# Patient Record
Sex: Female | Born: 1945
Health system: Southern US, Community
[De-identification: ages and names within clinical notes are randomized; demographics above are authoritative.]

## PROBLEM LIST (undated history)

## (undated) DIAGNOSIS — I509 Heart failure, unspecified: Secondary | ICD-10-CM

## (undated) DIAGNOSIS — I10 Essential (primary) hypertension: Secondary | ICD-10-CM

## (undated) DIAGNOSIS — I639 Cerebral infarction, unspecified: Secondary | ICD-10-CM

## (undated) DIAGNOSIS — T82867A Thrombosis of cardiac prosthetic devices, implants and grafts, initial encounter: Secondary | ICD-10-CM

## (undated) DIAGNOSIS — Z952 Presence of prosthetic heart valve: Secondary | ICD-10-CM

## (undated) DIAGNOSIS — I38 Endocarditis, valve unspecified: Secondary | ICD-10-CM

## (undated) DIAGNOSIS — E785 Hyperlipidemia, unspecified: Secondary | ICD-10-CM

## (undated) DIAGNOSIS — Z953 Presence of xenogenic heart valve: Secondary | ICD-10-CM

## (undated) DIAGNOSIS — T8209XA Other mechanical complication of heart valve prosthesis, initial encounter: Secondary | ICD-10-CM

## (undated) HISTORY — DX: Essential (primary) hypertension: I10

## (undated) HISTORY — PX: CARDIAC VALVE REPLACEMENT: SHX585

## (undated) HISTORY — DX: Hyperlipidemia, unspecified: E78.5

## (undated) HISTORY — DX: Cerebral infarction, unspecified: I63.9

## (undated) HISTORY — PX: TUBAL LIGATION: SHX77

## (undated) HISTORY — DX: Presence of prosthetic heart valve: Z95.2

## (undated) HISTORY — PX: ABDOMINAL HYSTERECTOMY: SHX81

---

## 1998-01-07 DIAGNOSIS — I38 Endocarditis, valve unspecified: Secondary | ICD-10-CM

## 1998-01-07 HISTORY — DX: Endocarditis, valve unspecified: I38

## 1998-03-23 ENCOUNTER — Inpatient Hospital Stay (HOSPITAL_COMMUNITY): Admission: EM | Admit: 1998-03-23 | Discharge: 1998-04-18 | Payer: Self-pay | Admitting: *Deleted

## 1998-03-24 ENCOUNTER — Encounter: Payer: Self-pay | Admitting: Internal Medicine

## 1998-03-25 ENCOUNTER — Encounter: Payer: Self-pay | Admitting: Internal Medicine

## 1998-03-26 ENCOUNTER — Encounter: Payer: Self-pay | Admitting: Internal Medicine

## 1998-03-27 ENCOUNTER — Encounter: Payer: Self-pay | Admitting: Internal Medicine

## 1998-03-30 ENCOUNTER — Encounter: Payer: Self-pay | Admitting: Internal Medicine

## 1998-04-05 ENCOUNTER — Encounter: Payer: Self-pay | Admitting: Thoracic Surgery (Cardiothoracic Vascular Surgery)

## 1998-04-05 ENCOUNTER — Encounter: Payer: Self-pay | Admitting: Infectious Diseases

## 1998-04-06 ENCOUNTER — Encounter: Payer: Self-pay | Admitting: Thoracic Surgery (Cardiothoracic Vascular Surgery)

## 1998-04-06 ENCOUNTER — Encounter: Payer: Self-pay | Admitting: Internal Medicine

## 1998-04-11 ENCOUNTER — Encounter: Payer: Self-pay | Admitting: Thoracic Surgery (Cardiothoracic Vascular Surgery)

## 1998-04-11 DIAGNOSIS — Z952 Presence of prosthetic heart valve: Secondary | ICD-10-CM

## 1998-04-11 HISTORY — DX: Presence of prosthetic heart valve: Z95.2

## 1998-04-11 HISTORY — PX: MITRAL VALVE REPLACEMENT: SHX147

## 1998-04-12 ENCOUNTER — Encounter: Payer: Self-pay | Admitting: Thoracic Surgery (Cardiothoracic Vascular Surgery)

## 1998-04-13 ENCOUNTER — Encounter: Payer: Self-pay | Admitting: Thoracic Surgery (Cardiothoracic Vascular Surgery)

## 1998-04-14 ENCOUNTER — Encounter: Payer: Self-pay | Admitting: Thoracic Surgery (Cardiothoracic Vascular Surgery)

## 1998-04-16 ENCOUNTER — Encounter: Payer: Self-pay | Admitting: Thoracic Surgery (Cardiothoracic Vascular Surgery)

## 1998-04-17 ENCOUNTER — Encounter: Payer: Self-pay | Admitting: Thoracic Surgery (Cardiothoracic Vascular Surgery)

## 1998-12-14 ENCOUNTER — Encounter (INDEPENDENT_AMBULATORY_CARE_PROVIDER_SITE_OTHER): Payer: Self-pay

## 1998-12-14 ENCOUNTER — Inpatient Hospital Stay (HOSPITAL_COMMUNITY): Admission: RE | Admit: 1998-12-14 | Discharge: 1998-12-18 | Payer: Self-pay | Admitting: Obstetrics and Gynecology

## 1998-12-14 HISTORY — PX: TOTAL ABDOMINAL HYSTERECTOMY W/ BILATERAL SALPINGOOPHORECTOMY: SHX83

## 2000-04-09 ENCOUNTER — Other Ambulatory Visit: Admission: RE | Admit: 2000-04-09 | Discharge: 2000-04-09 | Payer: Self-pay | Admitting: Obstetrics and Gynecology

## 2006-10-31 ENCOUNTER — Emergency Department (HOSPITAL_COMMUNITY): Admission: EM | Admit: 2006-10-31 | Discharge: 2006-10-31 | Payer: Self-pay | Admitting: Family Medicine

## 2007-01-12 ENCOUNTER — Inpatient Hospital Stay (HOSPITAL_COMMUNITY): Admission: EM | Admit: 2007-01-12 | Discharge: 2007-01-17 | Payer: Self-pay | Admitting: Emergency Medicine

## 2008-11-07 ENCOUNTER — Emergency Department (HOSPITAL_COMMUNITY): Admission: EM | Admit: 2008-11-07 | Discharge: 2008-11-07 | Payer: Self-pay | Admitting: Emergency Medicine

## 2009-11-07 HISTORY — PX: TRANSTHORACIC ECHOCARDIOGRAM: SHX275

## 2010-05-25 NOTE — Discharge Summary (Signed)
NAMEBenay Rodriguez             ACCOUNT NO.:  [id number]   MEDICAL RECORD NO.:  [id number]          PATIENT TYPE:  INP   LOCATION:  6526                         FACILITY:  [facility]   PHYSICIAN:  Madaline Savage, M.D.DATE OF BIRTH:  03-07-1945   DATE OF ADMISSION:  01/12/2007  DATE OF DISCHARGE:  01/17/2007                               DISCHARGE SUMMARY   DISCHARGE DIAGNOSES:  1. Agitation  2. Uncontrolled hypertension.  3. History of St. Jude MVR was subtherapeutic INR at admission.  4. History of PAF.  5. History of noncompliance   HOSPITAL COURSE:  Ms.  [name] is a 65 year old female who actually used  to work at [employer].  She has a history of a St. Jude mitral valve  2000 by Dr. Cornelius Moras.  She was admitted by Dr. Elsie Lincoln January 12, 2007, with  dizziness and being anxious and agitated.  She said she had had problems  with affording her medications and had been off all medicines for a  month.  INR was 1.35.  She was admitted, put on Lovenox and Coumadin was  resumed.  Medications for hypertension were resumed as well.  Troponin  was slightly positive.  We were concerned about her prosthesis,  especially being off Coumadin.  At TEE was obtained which showed at  least two but probably multiple was highly mobile thrombi attached to  the mitral valve ring which were nonobstructive.  The gradient across  the valve was normal.  LV function was depressed at 25%.  We did do an  MRI prior to discharge as we were concerned there may be some component  of possible multi-infarct dementia.  MRI showed a small area diffusion  restriction in the right inferior occipital lobe which appeared to  represent a small acute infarct.  She had mild chronic microvascular  changes and chronic sinusitis.  Dr. Lynnea Ferrier saw the patient January 17, 2007.  He felt the patient could be discharged.  She will follow up with  Dr. Elsie Lincoln as an outpatient.   DISCHARGE LABORATORIES:  White count 2.9, hemoglobin  13.9, hematocrit  40.8, INR is 3.3.  Sodium 141, potassium 3.4, BUN 6, creatinine 1.0.  Liver functions were slightly elevated with an AST of 42 and ALT of 42,  total bili was 2.2 hemoglobin A1c was 5.8.  Troponin was slightly  elevated at 1.35.  CK MBs negative.  TSH is 1.58.  EKG shows sinus  rhythm.   DISPOSITION:  The patient is discharged in stable condition and will  follow-up with Dr. Elsie Lincoln as an outpatient.   DISCHARGE MEDICATIONS:  1. Norvasc 10 mg a day.  2. Lisinopril 10 mg a day.  3. Coreg 6.25 1/2 tablet b.i.d.  4. Coumadin cut back to 3 mg a day or as directed at discharge.  5. Aldactone 12.5 mg a day.  6. Ambien p.r.n. for sleep.  7. She will have a protime Monday after discharge.   DISPOSITION:  The patient is discharged in stable condition.      Abelino Derrick, P.A.    ______________________________  Madaline Savage, M.D.  LKK/MEDQ  D:  02/02/2007  T:  02/03/2007  Job:  [id number]

## 2010-05-25 NOTE — Op Note (Signed)
[facility]  Patient:    [name]                     MRN: [id number] Proc. Date: 12/14/98 Adm. Date:  [id number] Attending:  Trevor Iha                           Operative Report  PREOPERATIVE DIAGNOSIS:       22-week size leiomyomatous uterus.  POSTOPERATIVE DIAGNOSIS:      22-week size leiomyomatous uterus.  OPERATION:                    TAH, BSO.  SURGEON:                      Trevor Iha, M.D.  ASSISTANT:                    Guy Sandifer. Arleta Creek, M.D.  ANESTHESIA:                   General endotracheal anesthesia.  ESTIMATED BLOOD LOSS:  INDICATIONS:                  [name] is a 65 year old, gravida 3, para 3, with large leiomyomatous uterus and currently on anticoagulation due to heart valve replacement.  She presents today for total abdominal hysterectomy and bilateral  salpingo-oophorectomy.  Coumadin was stopped four days ago.  The Lovenox was stopped yesterday.  INR this morning was 1.2.  The risks and benefits of the procedure were discussed at length including, but not limited to, risks of infection, damage to bowel, bladder, ureters with need for further surgery, and  also bleeding.  She is at a significant risk for bleeding both intraoperatively and postoperatively.  She was offered autologous blood donation, but missed her appointment and was unable to give.  She understands the risks associated with blood transfusion and plans to proceed with TAHBSO today.  Again the informed consent was obtained.  DESCRIPTION OF PROCEDURE:     After adequate general anesthesia, the patient was placed in the supine position.  She was sterilely prepped and draped.  A midline incision was made from the pubic symphysis up to just under the umbilicus, taken down sharply to the fascia which was incised rectolaterally.  The peritoneum was entered sharply and extended superiorly and inferiorly.  A large  irregular shaped leiomyomatous uterus and normal appearing tubes and ovaries were noted.  The OConnor-OSullivan retractor was placed and the bowel was packed cephalad.  The left round ligament was identified, suture ligated with 0 Monocryl.  The right round ligament was identified in a similar fashion and suture ligated with 0 Monocryl.  The bladder was reflected off the anterior surface of the uterus and  cervix.  The left infundibulopelvic ligament was identified, clamped, and doubly ligated with 0 Monocryl suture.  A lot of edema and vascular congestion was noted in the infundibulopelvic ligament.  Care was taken to avoid the ureter.  This was done similarly on the right side, doubly ligating the right infundibulopelvic ligament.  Kelly clamps had been placed across the broad ligament bilaterally at this point.  The large vessels and vasculature were clamped, cut, and tied along the sides of the uterus down to the level of the internal os.  They were clamped, cut, and tied using 0 Monocryl sutures until the  uterine vasculature at the level of the internal os was clamped, cut, and tied.  This was done bilaterally.  At his point, the uterine corpus was extracted using a knife excising the uterus above the internal os and removing the tissue and sending it to pathology.  Kocher was placed on the cervix.  Heaney clamps were clamped, cut, and tied bilaterally with 0 Monocryl suture along the cardinal ligaments down to the uterosacral ligaments. At this point, the Heaney clamps were placed across the vagina bilaterally.  The remainder of the cervix was removed intact and sent with this specimen.  0 Monocryl suture was used to close the vagina at this point in a horizontal fashion.  0 Monocryl sutures in figure-of-eight were used along the vagina for good hemostasis and closure.  The uterosacral ligaments were then plicated in the midline with  Monocryl suture.  Irrigation was  applied and after adequate hemostasis was assured, reexamination of all of the pedicles and peritoneal edges revealed good hemostasis. Because of the propensity of the bleeding along the peritoneal edges, Gelfoam was placed in the cul-de-sac.  The appendix was noted to be normal.  The packing was removed.  The OConnor-OSullivan retractor was then removed.  The incision was then closed in a mass closure with double-stranded 0 PDS starting at the umbilicus after two throws, contamination of the needle occurred, so the suture was cut.  Contaminated needle was removed and second suture of 0 PDS double-stranded was begun inferiorly in a modified Smead-Jones fashion.  After closure, the two sutures were tied in the midline with good approximation and good hemostasis. Irrigation was once again applied and after adequate hemostasis, the skin was stapled. The patient tolerated the procedure well and was stable on transfer to the recovery  room.  The estimated blood loss was 400 cc. DD:  12/14/98 TD:  12/14/98 Job: [id number]

## 2010-05-25 NOTE — Discharge Summary (Signed)
[facility]  Patient:    [name]                     MRN: [id number] Adm. Date:  [id number] Disc. Date: 12/18/98 Attending:  Trevor Iha                           Discharge Summary  HISTORY OF PRESENT ILLNESS:   [name] is a 65 year old G3, P3 with 22-week size pelvic mass consistent with leiomyomatous uterus.  Her history is also complicated by anticoagulation due to placement of a heart valve.  She has been cleared by he cardiovascular surgeon and cardiologist to undergo hysterectomy for large pelvic fibroids.  She presents for TAH-BSO.  She has been off Coumadin for four days, given vitamin K yesterday morning because of elevated INR.  She was offered autologous blood donation but had rescheduled and missed her appointment and was unable to give blood.  HOSPITAL COURSE:              Patient underwent a TAH-BSO.  Estimated blood loss during the procedure was 400 cc.  There were no complications.  Her postoperative course, she had good return of bowel function and ambulation.  Coumadin was started hospital day  #1.  On postoperative day #1 she had hemoglobin of 12, platelets 80. Postoperative day #3 she was begun on Lovenox and placed postoperative day #4, as ambulating well, tolerating a regular diet, no evidence of bleeding.  She had hemoglobin of 11.4, INR of 1.3, with a PT of 14.8, PTT of 44 but was on liquid Lovenox.  Incision was clean, dry and intact with the exception of ecchymosis around the incisional site.  No mitral valve sounds.  DISPOSITION:                  Patient will be discharged to home.  FOLLOWUP:                     In the office in three days for PT and INR.  DISCHARGE MEDICATIONS:        She is going to be continued on Lovenox 60 mg subcu q.12h.  She was given a prescription for Tylox, #30, and also Premarin 0.9 mg to take q.d.  DISCHARGE CONDITION:          Good. DD:  12/18/98 TD:   12/18/98 Job: [id number]

## 2010-09-27 LAB — BASIC METABOLIC PANEL
CO2: 26
CO2: 27
Calcium: 10.5
Calcium: 10.7 — ABNORMAL HIGH
Chloride: 110
Creatinine, Ser: 1.06
Creatinine, Ser: 1.07
GFR calc non Af Amer: 57 — ABNORMAL LOW
Glucose, Bld: 102 — ABNORMAL HIGH
Glucose, Bld: 105 — ABNORMAL HIGH
Glucose, Bld: 118 — ABNORMAL HIGH
Potassium: 3.4 — ABNORMAL LOW
Sodium: 137
Sodium: 141

## 2010-09-27 LAB — COMPREHENSIVE METABOLIC PANEL
AST: 42 — ABNORMAL HIGH
Albumin: 3.6
Calcium: 11 — ABNORMAL HIGH
Creatinine, Ser: 0.99
GFR calc non Af Amer: 57 — ABNORMAL LOW

## 2010-09-27 LAB — CBC
HCT: 40.8
HCT: 41.8
MCHC: 33.2
MCHC: 34
MCV: 82.2
MCV: 82.5
MCV: 82.5
Platelets: 207
Platelets: 260
RBC: 4.97
RBC: 5.12 — ABNORMAL HIGH
RDW: 15.6 — ABNORMAL HIGH
WBC: 2.9 — ABNORMAL LOW

## 2010-09-27 LAB — CK TOTAL AND CKMB (NOT AT ARMC)
CK, MB: 5.3 — ABNORMAL HIGH
Total CK: 83

## 2010-09-27 LAB — PROTIME-INR
INR: 1.1
INR: 2.2 — ABNORMAL HIGH
INR: 3.3 — ABNORMAL HIGH
Prothrombin Time: 14.2
Prothrombin Time: 14.8
Prothrombin Time: 35.1 — ABNORMAL HIGH

## 2010-09-27 LAB — CARDIAC PANEL(CRET KIN+CKTOT+MB+TROPI)
CK, MB: 4
CK, MB: 6.4 — ABNORMAL HIGH
CK, MB: 7.7 — ABNORMAL HIGH
Total CK: 115

## 2010-09-27 LAB — B-NATRIURETIC PEPTIDE (CONVERTED LAB)
Pro B Natriuretic peptide (BNP): 1323 — ABNORMAL HIGH
Pro B Natriuretic peptide (BNP): 988 — ABNORMAL HIGH

## 2010-09-27 LAB — DIFFERENTIAL
Basophils Absolute: 0
Lymphocytes Relative: 44
Monocytes Absolute: 0.4
Monocytes Relative: 11
Neutro Abs: 1.5 — ABNORMAL LOW

## 2010-09-27 LAB — TROPONIN I

## 2010-09-27 LAB — APTT: aPTT: 30

## 2010-09-27 LAB — TSH: TSH: 1.77

## 2011-10-16 ENCOUNTER — Other Ambulatory Visit: Payer: Self-pay | Admitting: Cardiovascular Disease

## 2011-10-16 DIAGNOSIS — Z1231 Encounter for screening mammogram for malignant neoplasm of breast: Secondary | ICD-10-CM

## 2011-10-22 ENCOUNTER — Ambulatory Visit: Payer: Self-pay

## 2011-11-05 ENCOUNTER — Ambulatory Visit (HOSPITAL_COMMUNITY): Admission: RE | Admit: 2011-11-05 | Payer: 59 | Source: Ambulatory Visit

## 2012-07-27 ENCOUNTER — Ambulatory Visit (INDEPENDENT_AMBULATORY_CARE_PROVIDER_SITE_OTHER): Payer: 59 | Admitting: Pharmacist Clinician (PhC)/ Clinical Pharmacy Specialist

## 2012-07-27 ENCOUNTER — Other Ambulatory Visit: Payer: Self-pay | Admitting: Pharmacist Clinician (PhC)/ Clinical Pharmacy Specialist

## 2012-07-27 VITALS — BP 90/60 | HR 64

## 2012-07-27 DIAGNOSIS — Z7901 Long term (current) use of anticoagulants: Secondary | ICD-10-CM

## 2012-07-27 DIAGNOSIS — Z954 Presence of other heart-valve replacement: Secondary | ICD-10-CM

## 2012-07-27 MED ORDER — WARFARIN SODIUM 3 MG PO TABS: ORAL_TABLET | ORAL | Status: DC

## 2012-07-31 ENCOUNTER — Other Ambulatory Visit: Payer: Self-pay | Admitting: *Deleted

## 2012-07-31 MED ORDER — WARFARIN SODIUM 3 MG PO TABS: ORAL_TABLET | ORAL | Status: DC

## 2012-07-31 NOTE — Telephone Encounter (Signed)
Rx was sent to pharmacy electronically. 

## 2012-08-04 ENCOUNTER — Other Ambulatory Visit: Payer: Self-pay | Admitting: *Deleted

## 2012-09-08 ENCOUNTER — Ambulatory Visit (INDEPENDENT_AMBULATORY_CARE_PROVIDER_SITE_OTHER): Payer: Medicare Other | Admitting: Pharmacist Clinician (PhC)/ Clinical Pharmacy Specialist

## 2012-09-08 VITALS — BP 100/80 | HR 64

## 2012-09-08 DIAGNOSIS — Z954 Presence of other heart-valve replacement: Secondary | ICD-10-CM

## 2012-09-08 DIAGNOSIS — Z7901 Long term (current) use of anticoagulants: Secondary | ICD-10-CM | POA: Diagnosis not present

## 2012-09-29 ENCOUNTER — Ambulatory Visit (INDEPENDENT_AMBULATORY_CARE_PROVIDER_SITE_OTHER): Payer: Medicare Other | Admitting: Pharmacist Clinician (PhC)/ Clinical Pharmacy Specialist

## 2012-09-29 VITALS — BP 110/82 | HR 68

## 2012-09-29 DIAGNOSIS — Z954 Presence of other heart-valve replacement: Secondary | ICD-10-CM | POA: Diagnosis not present

## 2012-09-29 DIAGNOSIS — Z7901 Long term (current) use of anticoagulants: Secondary | ICD-10-CM

## 2012-10-27 ENCOUNTER — Ambulatory Visit (INDEPENDENT_AMBULATORY_CARE_PROVIDER_SITE_OTHER): Payer: Medicare Other | Admitting: Pharmacist Clinician (PhC)/ Clinical Pharmacy Specialist

## 2012-10-27 VITALS — BP 144/80

## 2012-10-27 DIAGNOSIS — Z954 Presence of other heart-valve replacement: Secondary | ICD-10-CM | POA: Diagnosis not present

## 2012-10-27 DIAGNOSIS — Z7901 Long term (current) use of anticoagulants: Secondary | ICD-10-CM | POA: Diagnosis not present

## 2012-11-24 ENCOUNTER — Ambulatory Visit (INDEPENDENT_AMBULATORY_CARE_PROVIDER_SITE_OTHER): Payer: Medicare Other | Admitting: Pharmacist Clinician (PhC)/ Clinical Pharmacy Specialist

## 2012-11-24 VITALS — BP 160/88 | HR 76

## 2012-11-24 DIAGNOSIS — Z7901 Long term (current) use of anticoagulants: Secondary | ICD-10-CM | POA: Diagnosis not present

## 2012-11-24 DIAGNOSIS — Z954 Presence of other heart-valve replacement: Secondary | ICD-10-CM

## 2012-11-25 ENCOUNTER — Other Ambulatory Visit: Payer: Self-pay | Admitting: *Deleted

## 2012-11-25 MED ORDER — LISINOPRIL 20 MG PO TABS: 20.0000 mg | ORAL_TABLET | Freq: Every day | ORAL | Status: DC

## 2012-12-22 ENCOUNTER — Ambulatory Visit (INDEPENDENT_AMBULATORY_CARE_PROVIDER_SITE_OTHER): Payer: Medicare Other | Admitting: Pharmacist Clinician (PhC)/ Clinical Pharmacy Specialist

## 2012-12-22 VITALS — BP 120/72 | HR 68

## 2012-12-22 DIAGNOSIS — Z7901 Long term (current) use of anticoagulants: Secondary | ICD-10-CM | POA: Diagnosis not present

## 2012-12-22 DIAGNOSIS — Z954 Presence of other heart-valve replacement: Secondary | ICD-10-CM

## 2013-01-08 ENCOUNTER — Other Ambulatory Visit: Payer: Self-pay | Admitting: Cardiovascular Disease

## 2013-01-08 NOTE — Telephone Encounter (Signed)
Rx was sent to pharmacy electronically. 

## 2013-01-17 ENCOUNTER — Encounter: Payer: Self-pay | Admitting: *Deleted

## 2013-01-19 ENCOUNTER — Encounter: Payer: Self-pay | Admitting: Internal Medicine

## 2013-01-19 ENCOUNTER — Ambulatory Visit: Payer: Medicare Other | Admitting: Pharmacist Clinician (PhC)/ Clinical Pharmacy Specialist

## 2013-01-21 ENCOUNTER — Encounter: Payer: Self-pay | Admitting: Internal Medicine

## 2013-01-21 ENCOUNTER — Ambulatory Visit: Payer: Medicare Other | Admitting: Internal Medicine

## 2013-01-21 ENCOUNTER — Ambulatory Visit (INDEPENDENT_AMBULATORY_CARE_PROVIDER_SITE_OTHER): Payer: Medicare Other | Admitting: Internal Medicine

## 2013-01-21 ENCOUNTER — Ambulatory Visit (INDEPENDENT_AMBULATORY_CARE_PROVIDER_SITE_OTHER): Payer: Medicare Other | Admitting: Pharmacist Clinician (PhC)/ Clinical Pharmacy Specialist

## 2013-01-21 VITALS — BP 166/92 | HR 71 | Ht 64.0 in | Wt 164.1 lb

## 2013-01-21 DIAGNOSIS — Z954 Presence of other heart-valve replacement: Secondary | ICD-10-CM

## 2013-01-21 DIAGNOSIS — Z7901 Long term (current) use of anticoagulants: Secondary | ICD-10-CM | POA: Diagnosis not present

## 2013-01-21 DIAGNOSIS — E785 Hyperlipidemia, unspecified: Secondary | ICD-10-CM

## 2013-01-21 DIAGNOSIS — I10 Essential (primary) hypertension: Secondary | ICD-10-CM

## 2013-01-21 DIAGNOSIS — Z79899 Other long term (current) drug therapy: Secondary | ICD-10-CM

## 2013-01-21 DIAGNOSIS — Z952 Presence of prosthetic heart valve: Secondary | ICD-10-CM

## 2013-01-21 LAB — POCT INR: INR: 3.9

## 2013-01-21 MED ORDER — SIMVASTATIN 40 MG PO TABS: 40.0000 mg | ORAL_TABLET | Freq: Every day | ORAL | Status: DC

## 2013-01-21 NOTE — Patient Instructions (Signed)
Your physician has requested that you have an echocardiogram. Echocardiography is a painless test that uses sound waves to create images of your heart. It provides your doctor with information about the size and shape of your heart and how well your heart's chambers and valves are working. This procedure takes approximately one hour. There are no restrictions for this procedure.  Your physician recommends that you return for lab work in: a few days to a week (you will need to be fasting)  Your physician wants you to follow-up in: 1 year. You will receive a reminder letter in the mail two months in advance. If you don't receive a letter, please call our office to schedule the follow-up appointment.

## 2013-01-23 ENCOUNTER — Encounter: Payer: Self-pay | Admitting: Internal Medicine

## 2013-01-23 DIAGNOSIS — E785 Hyperlipidemia, unspecified: Secondary | ICD-10-CM | POA: Insufficient documentation

## 2013-01-23 DIAGNOSIS — I10 Essential (primary) hypertension: Secondary | ICD-10-CM | POA: Insufficient documentation

## 2013-01-23 NOTE — Progress Notes (Signed)
OFFICE NOTE  Chief Complaint:  Establish new cardiologist  Primary Care Physician: No PCP Per Patient  HPI:  [name] is a pleasant [age] her old female who unfortunately had a spontaneous valvular endocarditis after a pneumonia in 2000. She ultimately had severe mitral regurgitation and destruction of the mitral valve necessitating emergent replacement with a 29 mm mechanical St. Jude valve by Dr. Cornelius Moraswen. Since that time she's done very well. She continues to exercise and did not get any shortness of breath or chest pain. She also has dyslipidemia and hypertension which is well managed. She is chronically on warfarin therapy which is followed in this office.  Her last echocardiogram to evaluate valvular gradients was in 2011 which showed an EF of  greater than 55%. The gradients across the valve were 9 and 4.9 mm Hg.  PMHx:  Past Medical History  Diagnosis Date  . Hypertension   . Hyperlipidemia   . S/P MVR (mitral valve replacement) 04/1998    #29 St. Jude    Past Surgical History  Procedure Laterality Date  . Mitral valve replacement  04/11/1998    St. Jude mechanical MVR (severe MR, episode of streptococcal bacterial endocarditis - Dr. Cornelius Moraswen  . Tubal ligation    . Transthoracic echocardiogram  11/2009    EF=>55%; bi-leaflet St. Jude mech prosthesis; mild TR, RVSP elevated at 40-2550mmHg, mod pulm HTN; trace AV regurg; mild pulm valve regurg    FAMHx:  Family History  Problem Relation Age of Onset  . Lung cancer Mother   . Heart failure Maternal Grandmother   . Heart failure Maternal Grandfather     SOCHx:   reports that she quit smoking about 40 years ago. She has never used smokeless tobacco. She reports that she does not drink alcohol or use illicit drugs.  ALLERGIES:  Allergies  Allergen Reactions  . Augmentin [Amoxicillin-Pot Clavulanate]     ROS: A comprehensive review of systems was negative.  HOME MEDS: Current Outpatient Prescriptions  Medication Sig  Dispense Refill  . carvedilol (COREG) 6.25 MG tablet Take 6.25 mg by mouth 2 (two) times daily with a meal.      . lisinopril (PRINIVIL,ZESTRIL) 20 MG tablet Take 1 tablet by mouth daily.      . simvastatin (ZOCOR) 40 MG tablet Take 1 tablet (40 mg total) by mouth daily.  30 tablet  11  . triamterene-hydrochlorothiazide (DYAZIDE) 37.5-25 MG per capsule Take 1 capsule by mouth daily.      Marland Kitchen. warfarin (COUMADIN) 3 MG tablet Take 1-1.5 tablets by mouth once daily as directed  120 tablet  0   No current facility-administered medications for this visit.    LABS/IMAGING: Results for orders placed in visit on 01/21/13 (from the past 48 hour(s))  POCT INR     Status: None   Collection Time    01/21/13  3:32 PM      Result Value Range   INR 3.9     No results found.  VITALS: BP 166/92  Pulse 71  Ht 5\' 4"  (1.626 m)  Wt 164 lb 1.6 oz (74.435 kg)  BMI 28.15 kg/m2  EXAM: General appearance: alert and no distress Neck: no carotid bruit and no JVD Lungs: clear to auscultation bilaterally Heart: regular rate and rhythm, S1, S2 normal, there is a sharp mechanical valve sound in the mitral listening area Abdomen: soft, non-tender; bowel sounds normal; no masses,  no organomegaly Extremities: extremities normal, atraumatic, no cyanosis or edema Pulses: 2+  and symmetric Skin: Skin color, texture, turgor normal. No rashes or lesions Neurologic: Grossly normal Psych: Mood, affect normal  EKG: Normal sinus rhythm at 71  ASSESSMENT: 1. History of 29 mm St. Jude mechanical mitral valve replacement (2000) for SBE 2. HTN 3. Dyslipidemia 4. Chronic anticoagulation on warfarin  PLAN: 1.   Mrs. [name] is doing well on her current medication regimen. She is due for a echocardiogram as her last study was in 2011 to look at the gradient across the mechanical mitral valve.  Also like to obtain a fasting lipid profile and adjust medications accordingly. We'll contact her with the results of these studies  and let us see her back in a year or sooner as necessary.  Margaret Nose, MD, [facility] Attending Cardiologist [facility]  Tex Conroy C 01/23/2013, 11:12 AM

## 2013-02-01 ENCOUNTER — Other Ambulatory Visit: Payer: Self-pay

## 2013-02-01 ENCOUNTER — Other Ambulatory Visit: Payer: Self-pay | Admitting: Internal Medicine

## 2013-02-01 MED ORDER — TRIAMTERENE-HCTZ 37.5-25 MG PO CAPS: 1.0000 | ORAL_CAPSULE | Freq: Every day | ORAL | Status: DC

## 2013-02-01 NOTE — Telephone Encounter (Signed)
Rx was sent to pharmacy electronically. 

## 2013-02-09 ENCOUNTER — Ambulatory Visit (HOSPITAL_COMMUNITY): Payer: Medicare Other

## 2013-02-17 ENCOUNTER — Ambulatory Visit (HOSPITAL_COMMUNITY)
Admission: RE | Admit: 2013-02-17 | Discharge: 2013-02-17 | Disposition: A | Discharge status: Home / Self Care | Payer: Medicare Other | Source: Ambulatory Visit | Attending: Cardiovascular Disease | Admitting: Cardiovascular Disease

## 2013-02-17 ENCOUNTER — Ambulatory Visit (INDEPENDENT_AMBULATORY_CARE_PROVIDER_SITE_OTHER): Payer: Medicare Other | Admitting: Pharmacist Clinician (PhC)/ Clinical Pharmacy Specialist

## 2013-02-17 VITALS — BP 130/74 | HR 76

## 2013-02-17 DIAGNOSIS — Z952 Presence of prosthetic heart valve: Secondary | ICD-10-CM

## 2013-02-17 DIAGNOSIS — I079 Rheumatic tricuspid valve disease, unspecified: Secondary | ICD-10-CM | POA: Diagnosis not present

## 2013-02-17 DIAGNOSIS — E785 Hyperlipidemia, unspecified: Secondary | ICD-10-CM | POA: Diagnosis not present

## 2013-02-17 DIAGNOSIS — Z954 Presence of other heart-valve replacement: Secondary | ICD-10-CM | POA: Diagnosis not present

## 2013-02-17 DIAGNOSIS — I10 Essential (primary) hypertension: Secondary | ICD-10-CM | POA: Insufficient documentation

## 2013-02-17 DIAGNOSIS — I359 Nonrheumatic aortic valve disorder, unspecified: Secondary | ICD-10-CM | POA: Diagnosis not present

## 2013-02-17 DIAGNOSIS — Z7901 Long term (current) use of anticoagulants: Secondary | ICD-10-CM

## 2013-02-17 DIAGNOSIS — Z79899 Other long term (current) drug therapy: Secondary | ICD-10-CM | POA: Diagnosis not present

## 2013-02-17 DIAGNOSIS — I379 Nonrheumatic pulmonary valve disorder, unspecified: Secondary | ICD-10-CM

## 2013-02-17 LAB — COMPREHENSIVE METABOLIC PANEL
ALT: 35 U/L (ref 0–35)
AST: 40 U/L — AB (ref 0–37)
Albumin: 4.3 g/dL (ref 3.5–5.2)
Alkaline Phosphatase: 80 U/L (ref 39–117)
BUN: 20 mg/dL (ref 6–23)
CALCIUM: 12.9 mg/dL — AB (ref 8.4–10.5)
CHLORIDE: 101 meq/L (ref 96–112)
CO2: 28 meq/L (ref 19–32)
Creat: 1.48 mg/dL — ABNORMAL HIGH (ref 0.50–1.10)
Glucose, Bld: 92 mg/dL (ref 70–99)
Potassium: 4.2 mEq/L (ref 3.5–5.3)
SODIUM: 137 meq/L (ref 135–145)
TOTAL PROTEIN: 8.1 g/dL (ref 6.0–8.3)
Total Bilirubin: 1.7 mg/dL — ABNORMAL HIGH (ref 0.2–1.2)

## 2013-02-17 LAB — POCT INR: INR: 3.8

## 2013-02-17 LAB — LIPID PANEL
Cholesterol: 169 mg/dL (ref 0–200)
HDL: 50 mg/dL (ref >39)
LDL CALC: 99 mg/dL (ref 0–99)
Total CHOL/HDL Ratio: 3.4 Ratio
Triglycerides: 99 mg/dL (ref <150)
VLDL: 20 mg/dL (ref 0–40)

## 2013-02-17 NOTE — Progress Notes (Signed)
2D Echo Performed 02/17/2013    Ahsha Hinsley, RCS  

## 2013-02-24 ENCOUNTER — Encounter: Payer: Self-pay | Admitting: *Deleted

## 2013-03-02 ENCOUNTER — Telehealth: Payer: Self-pay | Admitting: Internal Medicine

## 2013-03-02 ENCOUNTER — Other Ambulatory Visit: Payer: Self-pay | Admitting: Internal Medicine

## 2013-03-02 NOTE — Telephone Encounter (Signed)
Rx was sent to pharmacy electronically. 

## 2013-03-02 NOTE — Telephone Encounter (Signed)
Returned call to patient. Will leave copy of labs at front for patient to pick up

## 2013-03-02 NOTE — Telephone Encounter (Signed)
Pt called and said she received her lab results by phone last week. She was told that it would be mailed to her so she could take it to her other doctor. She still have not reecived her results.

## 2013-03-04 ENCOUNTER — Ambulatory Visit: Payer: Medicare Other | Admitting: Pharmacist Clinician (PhC)/ Clinical Pharmacy Specialist

## 2013-03-10 ENCOUNTER — Ambulatory Visit (INDEPENDENT_AMBULATORY_CARE_PROVIDER_SITE_OTHER): Payer: Medicare Other | Admitting: Pharmacist Clinician (PhC)/ Clinical Pharmacy Specialist

## 2013-03-10 VITALS — BP 100/78 | HR 68

## 2013-03-10 DIAGNOSIS — Z954 Presence of other heart-valve replacement: Secondary | ICD-10-CM

## 2013-03-10 DIAGNOSIS — Z7901 Long term (current) use of anticoagulants: Secondary | ICD-10-CM

## 2013-03-10 DIAGNOSIS — Z952 Presence of prosthetic heart valve: Secondary | ICD-10-CM

## 2013-03-10 LAB — POCT INR: INR: 5.4

## 2013-03-24 ENCOUNTER — Ambulatory Visit (INDEPENDENT_AMBULATORY_CARE_PROVIDER_SITE_OTHER): Payer: Medicare Other | Admitting: Pharmacist Clinician (PhC)/ Clinical Pharmacy Specialist

## 2013-03-24 DIAGNOSIS — Z954 Presence of other heart-valve replacement: Secondary | ICD-10-CM

## 2013-03-24 DIAGNOSIS — Z7901 Long term (current) use of anticoagulants: Secondary | ICD-10-CM | POA: Diagnosis not present

## 2013-03-24 DIAGNOSIS — Z952 Presence of prosthetic heart valve: Secondary | ICD-10-CM

## 2013-03-24 LAB — POCT INR: INR: 2.4

## 2013-04-05 ENCOUNTER — Other Ambulatory Visit: Payer: Self-pay | Admitting: Pharmacist Clinician (PhC)/ Clinical Pharmacy Specialist

## 2013-04-05 MED ORDER — WARFARIN SODIUM 3 MG PO TABS: ORAL_TABLET | ORAL | Status: DC

## 2013-04-14 ENCOUNTER — Ambulatory Visit (INDEPENDENT_AMBULATORY_CARE_PROVIDER_SITE_OTHER): Payer: Medicare Other | Admitting: Pharmacist Clinician (PhC)/ Clinical Pharmacy Specialist

## 2013-04-14 DIAGNOSIS — Z954 Presence of other heart-valve replacement: Secondary | ICD-10-CM

## 2013-04-14 DIAGNOSIS — Z7901 Long term (current) use of anticoagulants: Secondary | ICD-10-CM

## 2013-04-14 DIAGNOSIS — Z952 Presence of prosthetic heart valve: Secondary | ICD-10-CM

## 2013-04-14 LAB — POCT INR: INR: 2.7

## 2013-05-12 ENCOUNTER — Ambulatory Visit (INDEPENDENT_AMBULATORY_CARE_PROVIDER_SITE_OTHER): Payer: Medicare Other | Admitting: Pharmacist Clinician (PhC)/ Clinical Pharmacy Specialist

## 2013-05-12 DIAGNOSIS — Z7901 Long term (current) use of anticoagulants: Secondary | ICD-10-CM

## 2013-05-12 DIAGNOSIS — Z952 Presence of prosthetic heart valve: Secondary | ICD-10-CM

## 2013-05-12 DIAGNOSIS — Z954 Presence of other heart-valve replacement: Secondary | ICD-10-CM

## 2013-05-12 LAB — POCT INR: INR: 1.2

## 2013-05-25 ENCOUNTER — Ambulatory Visit: Payer: Medicare Other | Admitting: Pharmacist Clinician (PhC)/ Clinical Pharmacy Specialist

## 2013-06-21 ENCOUNTER — Telehealth: Payer: Self-pay | Admitting: Internal Medicine

## 2013-06-21 MED ORDER — CARVEDILOL 6.25 MG PO TABS: 6.2500 mg | ORAL_TABLET | Freq: Two times a day (BID) | ORAL | Status: DC

## 2013-06-21 NOTE — Telephone Encounter (Signed)
Pt wants to know if she is still suppose to be taking Carvedilol 6.25 mg.If so please let her know,been trying to get this refill for about 3 weeks or a month.If so please call this to [facility] at [facility]

## 2013-06-21 NOTE — Telephone Encounter (Signed)
Rx was sent to pharmacy electronically. Of note, no E-fax Rx or paper fax has been located regarding refill request

## 2013-07-08 ENCOUNTER — Telehealth: Payer: Self-pay | Admitting: Pharmacist Clinician (PhC)/ Clinical Pharmacy Specialist

## 2013-07-08 NOTE — Telephone Encounter (Signed)
When does need her next coumadin appt?

## 2013-07-08 NOTE — Telephone Encounter (Signed)
Please call [name] and schedule INR appt Wed 8 or Mon 13th.  She is past due

## 2013-07-19 ENCOUNTER — Ambulatory Visit (INDEPENDENT_AMBULATORY_CARE_PROVIDER_SITE_OTHER): Payer: Medicare Other | Admitting: Pharmacist

## 2013-07-19 DIAGNOSIS — Z7901 Long term (current) use of anticoagulants: Secondary | ICD-10-CM

## 2013-07-19 DIAGNOSIS — Z954 Presence of other heart-valve replacement: Secondary | ICD-10-CM | POA: Diagnosis not present

## 2013-07-19 DIAGNOSIS — Z952 Presence of prosthetic heart valve: Secondary | ICD-10-CM

## 2013-07-19 LAB — POCT INR: INR: 1.2

## 2013-08-02 ENCOUNTER — Ambulatory Visit (INDEPENDENT_AMBULATORY_CARE_PROVIDER_SITE_OTHER): Payer: Medicare Other | Admitting: Pharmacist Clinician (PhC)/ Clinical Pharmacy Specialist

## 2013-08-02 DIAGNOSIS — Z7901 Long term (current) use of anticoagulants: Secondary | ICD-10-CM | POA: Diagnosis not present

## 2013-08-02 DIAGNOSIS — Z952 Presence of prosthetic heart valve: Secondary | ICD-10-CM

## 2013-08-02 DIAGNOSIS — Z954 Presence of other heart-valve replacement: Secondary | ICD-10-CM

## 2013-08-02 LAB — POCT INR: INR: 1.7

## 2013-08-23 ENCOUNTER — Ambulatory Visit: Payer: Medicare Other | Admitting: Pharmacist Clinician (PhC)/ Clinical Pharmacy Specialist

## 2013-09-10 ENCOUNTER — Ambulatory Visit (INDEPENDENT_AMBULATORY_CARE_PROVIDER_SITE_OTHER): Payer: Medicare Other | Admitting: Pharmacist Clinician (PhC)/ Clinical Pharmacy Specialist

## 2013-09-10 DIAGNOSIS — Z7901 Long term (current) use of anticoagulants: Secondary | ICD-10-CM

## 2013-09-10 DIAGNOSIS — Z954 Presence of other heart-valve replacement: Secondary | ICD-10-CM

## 2013-09-10 DIAGNOSIS — Z952 Presence of prosthetic heart valve: Secondary | ICD-10-CM

## 2013-09-10 LAB — POCT INR: INR: 1.2

## 2013-09-24 ENCOUNTER — Ambulatory Visit (INDEPENDENT_AMBULATORY_CARE_PROVIDER_SITE_OTHER): Payer: Medicare Other | Admitting: Pharmacist Clinician (PhC)/ Clinical Pharmacy Specialist

## 2013-09-24 ENCOUNTER — Ambulatory Visit: Payer: Medicare Other | Admitting: Pharmacist Clinician (PhC)/ Clinical Pharmacy Specialist

## 2013-09-24 DIAGNOSIS — Z954 Presence of other heart-valve replacement: Secondary | ICD-10-CM | POA: Diagnosis not present

## 2013-09-24 DIAGNOSIS — Z952 Presence of prosthetic heart valve: Secondary | ICD-10-CM

## 2013-09-24 DIAGNOSIS — Z7901 Long term (current) use of anticoagulants: Secondary | ICD-10-CM | POA: Diagnosis not present

## 2013-09-24 LAB — POCT INR: INR: 3.3

## 2013-10-01 ENCOUNTER — Ambulatory Visit: Payer: Medicare Other | Admitting: Pharmacist Clinician (PhC)/ Clinical Pharmacy Specialist

## 2013-10-22 ENCOUNTER — Ambulatory Visit (INDEPENDENT_AMBULATORY_CARE_PROVIDER_SITE_OTHER): Payer: Medicare Other | Admitting: Pharmacist Clinician (PhC)/ Clinical Pharmacy Specialist

## 2013-10-22 DIAGNOSIS — Z952 Presence of prosthetic heart valve: Secondary | ICD-10-CM

## 2013-10-22 DIAGNOSIS — Z954 Presence of other heart-valve replacement: Secondary | ICD-10-CM | POA: Diagnosis not present

## 2013-10-22 DIAGNOSIS — Z7901 Long term (current) use of anticoagulants: Secondary | ICD-10-CM

## 2013-10-22 LAB — POCT INR: INR: 2.8

## 2013-11-19 ENCOUNTER — Ambulatory Visit (INDEPENDENT_AMBULATORY_CARE_PROVIDER_SITE_OTHER): Payer: Medicare Other | Admitting: Pharmacist Clinician (PhC)/ Clinical Pharmacy Specialist

## 2013-11-19 DIAGNOSIS — Z7901 Long term (current) use of anticoagulants: Secondary | ICD-10-CM | POA: Diagnosis not present

## 2013-11-19 DIAGNOSIS — Z954 Presence of other heart-valve replacement: Secondary | ICD-10-CM

## 2013-11-19 DIAGNOSIS — Z952 Presence of prosthetic heart valve: Secondary | ICD-10-CM

## 2013-11-19 LAB — POCT INR: INR: 3.7

## 2013-12-17 ENCOUNTER — Ambulatory Visit (INDEPENDENT_AMBULATORY_CARE_PROVIDER_SITE_OTHER): Payer: Medicare Other | Admitting: Pharmacist Clinician (PhC)/ Clinical Pharmacy Specialist

## 2013-12-17 DIAGNOSIS — Z7901 Long term (current) use of anticoagulants: Secondary | ICD-10-CM | POA: Diagnosis not present

## 2013-12-17 DIAGNOSIS — Z954 Presence of other heart-valve replacement: Secondary | ICD-10-CM | POA: Diagnosis not present

## 2013-12-17 DIAGNOSIS — Z952 Presence of prosthetic heart valve: Secondary | ICD-10-CM

## 2013-12-17 LAB — POCT INR: INR: 2.8

## 2013-12-21 ENCOUNTER — Other Ambulatory Visit: Payer: Self-pay | Admitting: Pharmacist Clinician (PhC)/ Clinical Pharmacy Specialist

## 2013-12-21 ENCOUNTER — Telehealth: Payer: Self-pay | Admitting: Internal Medicine

## 2013-12-21 NOTE — Telephone Encounter (Signed)
[name] called in wanting to speak to a nurse about the pt's warfarin prescription. Please call back asap  Thanks

## 2013-12-22 NOTE — Telephone Encounter (Signed)
Ok to switch warfarin to different manufacturer

## 2013-12-23 ENCOUNTER — Other Ambulatory Visit: Payer: Self-pay | Admitting: Pharmacist Clinician (PhC)/ Clinical Pharmacy Specialist

## 2014-01-14 ENCOUNTER — Ambulatory Visit (INDEPENDENT_AMBULATORY_CARE_PROVIDER_SITE_OTHER): Payer: Medicare Other | Admitting: Pharmacist Clinician (PhC)/ Clinical Pharmacy Specialist

## 2014-01-14 DIAGNOSIS — Z7901 Long term (current) use of anticoagulants: Secondary | ICD-10-CM | POA: Diagnosis not present

## 2014-01-14 DIAGNOSIS — Z952 Presence of prosthetic heart valve: Secondary | ICD-10-CM

## 2014-01-14 DIAGNOSIS — Z954 Presence of other heart-valve replacement: Secondary | ICD-10-CM

## 2014-01-14 LAB — POCT INR: INR: 1.5

## 2014-02-04 ENCOUNTER — Ambulatory Visit: Payer: Medicare Other | Admitting: Pharmacist Clinician (PhC)/ Clinical Pharmacy Specialist

## 2014-02-09 ENCOUNTER — Telehealth: Payer: Self-pay | Admitting: Internal Medicine

## 2014-02-09 NOTE — Telephone Encounter (Signed)
Medication refilled in advance of appt 03/10/13. Appt confirmed.

## 2014-02-09 NOTE — Telephone Encounter (Signed)
Pt called in stating that she has been trying to get her Lisinopril refilled and the pharmacy told her that it was being denied by Dr. Rennis Golden. She would like to know if this is a medication she should continue to take. Please call  Thanks

## 2014-02-11 ENCOUNTER — Ambulatory Visit (INDEPENDENT_AMBULATORY_CARE_PROVIDER_SITE_OTHER): Payer: Medicare Other | Admitting: Pharmacist Clinician (PhC)/ Clinical Pharmacy Specialist

## 2014-02-11 DIAGNOSIS — Z7901 Long term (current) use of anticoagulants: Secondary | ICD-10-CM | POA: Diagnosis not present

## 2014-02-11 DIAGNOSIS — Z954 Presence of other heart-valve replacement: Secondary | ICD-10-CM

## 2014-02-11 DIAGNOSIS — Z952 Presence of prosthetic heart valve: Secondary | ICD-10-CM

## 2014-02-11 LAB — POCT INR: INR: 2.2

## 2014-03-11 ENCOUNTER — Ambulatory Visit (INDEPENDENT_AMBULATORY_CARE_PROVIDER_SITE_OTHER): Payer: Medicare Other | Admitting: Pharmacist Clinician (PhC)/ Clinical Pharmacy Specialist

## 2014-03-11 ENCOUNTER — Ambulatory Visit (INDEPENDENT_AMBULATORY_CARE_PROVIDER_SITE_OTHER): Payer: Medicare Other | Admitting: Internal Medicine

## 2014-03-11 ENCOUNTER — Encounter: Payer: Self-pay | Admitting: Internal Medicine

## 2014-03-11 VITALS — BP 172/98 | HR 87 | Ht 63.0 in | Wt 151.5 lb

## 2014-03-11 DIAGNOSIS — Z7901 Long term (current) use of anticoagulants: Secondary | ICD-10-CM | POA: Diagnosis not present

## 2014-03-11 DIAGNOSIS — Z954 Presence of other heart-valve replacement: Secondary | ICD-10-CM

## 2014-03-11 DIAGNOSIS — I10 Essential (primary) hypertension: Secondary | ICD-10-CM

## 2014-03-11 DIAGNOSIS — Z952 Presence of prosthetic heart valve: Secondary | ICD-10-CM

## 2014-03-11 DIAGNOSIS — I4510 Unspecified right bundle-branch block: Secondary | ICD-10-CM

## 2014-03-11 DIAGNOSIS — E785 Hyperlipidemia, unspecified: Secondary | ICD-10-CM

## 2014-03-11 LAB — POCT INR: INR: 3.6

## 2014-03-11 MED ORDER — TRIAMTERENE-HCTZ 37.5-25 MG PO CAPS: 1.0000 | ORAL_CAPSULE | Freq: Every day | ORAL | Status: DC

## 2014-03-11 MED ORDER — SIMVASTATIN 40 MG PO TABS: 40.0000 mg | ORAL_TABLET | Freq: Every day | ORAL | Status: DC

## 2014-03-11 MED ORDER — LISINOPRIL 20 MG PO TABS: 20.0000 mg | ORAL_TABLET | Freq: Every day | ORAL | Status: DC

## 2014-03-11 MED ORDER — CARVEDILOL 6.25 MG PO TABS: 6.2500 mg | ORAL_TABLET | Freq: Two times a day (BID) | ORAL | Status: DC

## 2014-03-11 NOTE — Patient Instructions (Signed)
Please have fasting lab work to check cholesterol  Your medications have been refilled  Your physician wants you to follow-up in: 1 year with Dr.Hilty. You will receive a reminder letter in the mail two months in advance. If you don't receive a letter, please call our office to schedule the follow-up appointment.

## 2014-03-11 NOTE — Progress Notes (Signed)
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 She ultimately had severe mitral regurgitation and destruction of the mitral valve necessitating emergent replacement with a 29 mm mechanical St. Jude valve by Dr. Cornelius Moras. Since that time she's done very well. She continues to exercise and did not get any shortness of breath or chest pain. She also has dyslipidemia and hypertension which is well managed. She is chronically on warfarin therapy which is followed in this office.  Her last echocardiogram to evaluate valvular gradients was in 2011 which showed an EF of  greater than 55%. The gradients across the valve were 9 and 4.9 mm Hg.  I saw [name] back in the office today. She is doing extremely well. She denies any shortness of breath or worsening chest pain. She continues to exercise. Her weight is appropriate. [name] was mildly elevated today and is followed by [name]. Her mitral valve apparently is working well. Her last echocardiogram in 2015 showed stable gradients and normal LV function. She is due for cholesterol reassessment. Of note her blood pressure is elevated today. She says that she's been out of her medicines for a little while and that there were no refills that were authorized, however our system indicates that they are available.  PMHx:  Past Medical History  Diagnosis Date  . Hypertension   . Hyperlipidemia   . S/P MVR (mitral valve replacement) 04/1998    #29 St. Jude    Past Surgical History  Procedure Laterality Date  . Mitral valve replacement  04/11/1998    St. Jude mechanical MVR (severe MR, episode of streptococcal bacterial endocarditis - Dr. Cornelius Moras  . Tubal ligation    . Transthoracic echocardiogram  11/2009    EF=>55%; bi-leaflet St. Jude mech prosthesis; mild TR, RVSP  elevated at 40-86mmHg, mod pulm HTN; trace AV regurg; mild pulm valve regurg    FAMHx:  Family History  Problem Relation Age of Onset  . Lung cancer Mother   . Heart failure Maternal Grandmother   . Heart failure Maternal Grandfather     SOCHx:   reports that she quit smoking about 41 years ago. She has never used smokeless tobacco. She reports that she does not drink alcohol or use illicit drugs.  ALLERGIES:  Allergies  Allergen Reactions  . Augmentin [Amoxicillin-Pot Clavulanate]     ROS: A comprehensive review of systems was negative.  HOME MEDS: Current Outpatient Prescriptions  Medication Sig Dispense Refill  . carvedilol (COREG) 6.25 MG tablet Take 1 tablet (6.25 mg total) by mouth 2 (two) times daily with a meal. 60 tablet 11  . lisinopril (PRINIVIL,ZESTRIL) 20 MG tablet Take 1 tablet (20 mg total) by mouth daily. 30 tablet 11  . simvastatin (ZOCOR) 40 MG tablet Take 1 tablet (40 mg total) by mouth daily. 30 tablet 11  . triamterene-hydrochlorothiazide (DYAZIDE) 37.5-25 MG per capsule Take 1 each (1 capsule total) by mouth daily. 30 capsule 11  . warfarin (COUMADIN) 3 MG tablet TAKE ONE TO ONE & ONE-HALF TABLETS BY MOUTH ONCE DAILY AS DIRECTED 120 tablet 0   No current facility-administered medications for this visit.    LABS/IMAGING: No results found for this or any previous visit (from the past 48 hour(s)). No results found.  VITALS: BP 172/98 mmHg  Pulse 87  Ht  5\' 3"  (1.6 m)  Wt 151 lb 8 oz (68.72 kg)  BMI 26.84 kg/m2  EXAM: General appearance: alert and no distress Neck: no carotid bruit and no JVD Lungs: clear to auscultation bilaterally Heart: regular rate and rhythm, S1, S2 normal, there is a sharp mechanical valve sound in the mitral listening area Abdomen: soft, non-tender; bowel sounds normal; no masses,  no organomegaly Extremities: extremities normal, atraumatic, no cyanosis or edema Pulses: 2+ and symmetric Skin: Skin color, texture, turgor  normal. No rashes or lesions Neurologic: Grossly normal Psych: Mood, affect normal  EKG: Sinus rhythm with right bundle branch block at 57  ASSESSMENT: 1. History of 29 mm St. Jude mechanical mitral valve replacement (2000) for SBE 2. HTN 3. Dyslipidemia 4. Chronic anticoagulation on warfarin 5. New right bundle branch block  PLAN: 1.   Mrs. [name] is doing well on her current medication regimen. She remains asymptomatic. Her EKG shows what appears to be a new right bundle branch block. I'm not sure there is any significance to this and could be related to mitral valve surgery. Blood pressure is uncontrolled today however she's been off of her medications. I recommended she restart her medications and we have refilled her prescriptions today. I would recommend repeating her lipid profile and will adjust her cholesterol medicine is necessary. Follow-up annually or sooner as symptoms dictate.  Chrystie Nose, MD, [facility] Attending Cardiologist [facility]  HILTY,Kenneth C 03/11/2014, 10:33 AM

## 2014-07-21 ENCOUNTER — Emergency Department (HOSPITAL_COMMUNITY): Payer: Medicare Other

## 2014-07-21 ENCOUNTER — Inpatient Hospital Stay (HOSPITAL_COMMUNITY): Payer: Medicare Other

## 2014-07-21 ENCOUNTER — Emergency Department (HOSPITAL_COMMUNITY)
Admit: 2014-07-21 | Discharge: 2014-07-21 | Disposition: A | Discharge status: Home / Self Care | Payer: Medicare Other | Attending: Adult Health | Admitting: Adult Health

## 2014-07-21 ENCOUNTER — Encounter (HOSPITAL_COMMUNITY): Payer: Self-pay | Admitting: Adult Health

## 2014-07-21 ENCOUNTER — Inpatient Hospital Stay (HOSPITAL_COMMUNITY)
Admission: EM | Admit: 2014-07-21 | Discharge: 2014-08-03 | DRG: 216 | Disposition: A | Discharge status: Rehabilitation Facility | Payer: Medicare Other | Attending: Thoracic Surgery (Cardiothoracic Vascular Surgery) | Admitting: Thoracic Surgery (Cardiothoracic Vascular Surgery)

## 2014-07-21 DIAGNOSIS — T8209XA Other mechanical complication of heart valve prosthesis, initial encounter: Secondary | ICD-10-CM | POA: Diagnosis present

## 2014-07-21 DIAGNOSIS — J811 Chronic pulmonary edema: Secondary | ICD-10-CM | POA: Diagnosis not present

## 2014-07-21 DIAGNOSIS — T82867A Thrombosis of cardiac prosthetic devices, implants and grafts, initial encounter: Secondary | ICD-10-CM | POA: Diagnosis not present

## 2014-07-21 DIAGNOSIS — R740 Nonspecific elevation of levels of transaminase and lactic acid dehydrogenase [LDH]: Secondary | ICD-10-CM | POA: Diagnosis present

## 2014-07-21 DIAGNOSIS — J9601 Acute respiratory failure with hypoxia: Secondary | ICD-10-CM | POA: Diagnosis not present

## 2014-07-21 DIAGNOSIS — E870 Hyperosmolality and hypernatremia: Secondary | ICD-10-CM | POA: Diagnosis not present

## 2014-07-21 DIAGNOSIS — G931 Anoxic brain damage, not elsewhere classified: Secondary | ICD-10-CM | POA: Diagnosis not present

## 2014-07-21 DIAGNOSIS — E876 Hypokalemia: Secondary | ICD-10-CM | POA: Diagnosis not present

## 2014-07-21 DIAGNOSIS — R791 Abnormal coagulation profile: Secondary | ICD-10-CM | POA: Diagnosis present

## 2014-07-21 DIAGNOSIS — T45515A Adverse effect of anticoagulants, initial encounter: Secondary | ICD-10-CM | POA: Diagnosis present

## 2014-07-21 DIAGNOSIS — N186 End stage renal disease: Secondary | ICD-10-CM | POA: Diagnosis present

## 2014-07-21 DIAGNOSIS — I120 Hypertensive chronic kidney disease with stage 5 chronic kidney disease or end stage renal disease: Secondary | ICD-10-CM | POA: Diagnosis present

## 2014-07-21 DIAGNOSIS — Z954 Presence of other heart-valve replacement: Secondary | ICD-10-CM

## 2014-07-21 DIAGNOSIS — Z7901 Long term (current) use of anticoagulants: Secondary | ICD-10-CM | POA: Diagnosis not present

## 2014-07-21 DIAGNOSIS — T17990A Other foreign object in respiratory tract, part unspecified in causing asphyxiation, initial encounter: Secondary | ICD-10-CM | POA: Diagnosis not present

## 2014-07-21 DIAGNOSIS — Z881 Allergy status to other antibiotic agents status: Secondary | ICD-10-CM

## 2014-07-21 DIAGNOSIS — I340 Nonrheumatic mitral (valve) insufficiency: Secondary | ICD-10-CM | POA: Diagnosis not present

## 2014-07-21 DIAGNOSIS — Z452 Encounter for adjustment and management of vascular access device: Secondary | ICD-10-CM

## 2014-07-21 DIAGNOSIS — E872 Acidosis, unspecified: Secondary | ICD-10-CM

## 2014-07-21 DIAGNOSIS — G9341 Metabolic encephalopathy: Secondary | ICD-10-CM | POA: Diagnosis present

## 2014-07-21 DIAGNOSIS — R0902 Hypoxemia: Secondary | ICD-10-CM

## 2014-07-21 DIAGNOSIS — I4891 Unspecified atrial fibrillation: Secondary | ICD-10-CM | POA: Diagnosis present

## 2014-07-21 DIAGNOSIS — R579 Shock, unspecified: Secondary | ICD-10-CM

## 2014-07-21 DIAGNOSIS — I272 Other secondary pulmonary hypertension: Secondary | ICD-10-CM | POA: Diagnosis present

## 2014-07-21 DIAGNOSIS — I342 Nonrheumatic mitral (valve) stenosis: Secondary | ICD-10-CM | POA: Diagnosis not present

## 2014-07-21 DIAGNOSIS — N189 Chronic kidney disease, unspecified: Secondary | ICD-10-CM | POA: Diagnosis not present

## 2014-07-21 DIAGNOSIS — J9811 Atelectasis: Secondary | ICD-10-CM

## 2014-07-21 DIAGNOSIS — N179 Acute kidney failure, unspecified: Secondary | ICD-10-CM

## 2014-07-21 DIAGNOSIS — A419 Sepsis, unspecified organism: Secondary | ICD-10-CM | POA: Diagnosis not present

## 2014-07-21 DIAGNOSIS — I248 Other forms of acute ischemic heart disease: Secondary | ICD-10-CM | POA: Diagnosis present

## 2014-07-21 DIAGNOSIS — R6521 Severe sepsis with septic shock: Secondary | ICD-10-CM

## 2014-07-21 DIAGNOSIS — R339 Retention of urine, unspecified: Secondary | ICD-10-CM | POA: Diagnosis present

## 2014-07-21 DIAGNOSIS — I10 Essential (primary) hypertension: Secondary | ICD-10-CM | POA: Diagnosis present

## 2014-07-21 DIAGNOSIS — R570 Cardiogenic shock: Secondary | ICD-10-CM | POA: Diagnosis present

## 2014-07-21 DIAGNOSIS — I509 Heart failure, unspecified: Secondary | ICD-10-CM

## 2014-07-21 DIAGNOSIS — Z953 Presence of xenogenic heart valve: Secondary | ICD-10-CM

## 2014-07-21 DIAGNOSIS — N170 Acute kidney failure with tubular necrosis: Secondary | ICD-10-CM | POA: Diagnosis not present

## 2014-07-21 DIAGNOSIS — J9620 Acute and chronic respiratory failure, unspecified whether with hypoxia or hypercapnia: Secondary | ICD-10-CM | POA: Diagnosis not present

## 2014-07-21 DIAGNOSIS — I97110 Postprocedural cardiac insufficiency following cardiac surgery: Secondary | ICD-10-CM | POA: Diagnosis not present

## 2014-07-21 DIAGNOSIS — Z79899 Other long term (current) drug therapy: Secondary | ICD-10-CM

## 2014-07-21 DIAGNOSIS — I469 Cardiac arrest, cause unspecified: Secondary | ICD-10-CM | POA: Diagnosis not present

## 2014-07-21 DIAGNOSIS — E785 Hyperlipidemia, unspecified: Secondary | ICD-10-CM | POA: Diagnosis present

## 2014-07-21 DIAGNOSIS — T8201XA Breakdown (mechanical) of heart valve prosthesis, initial encounter: Principal | ICD-10-CM | POA: Diagnosis present

## 2014-07-21 DIAGNOSIS — I5031 Acute diastolic (congestive) heart failure: Secondary | ICD-10-CM | POA: Diagnosis not present

## 2014-07-21 DIAGNOSIS — Z9119 Patient's noncompliance with other medical treatment and regimen: Secondary | ICD-10-CM | POA: Diagnosis present

## 2014-07-21 DIAGNOSIS — Z4682 Encounter for fitting and adjustment of non-vascular catheter: Secondary | ICD-10-CM | POA: Diagnosis not present

## 2014-07-21 DIAGNOSIS — R7989 Other specified abnormal findings of blood chemistry: Secondary | ICD-10-CM | POA: Diagnosis not present

## 2014-07-21 DIAGNOSIS — Z952 Presence of prosthetic heart valve: Secondary | ICD-10-CM

## 2014-07-21 DIAGNOSIS — I5033 Acute on chronic diastolic (congestive) heart failure: Secondary | ICD-10-CM | POA: Diagnosis not present

## 2014-07-21 DIAGNOSIS — I050 Rheumatic mitral stenosis: Secondary | ICD-10-CM | POA: Diagnosis not present

## 2014-07-21 DIAGNOSIS — J810 Acute pulmonary edema: Secondary | ICD-10-CM | POA: Diagnosis not present

## 2014-07-21 DIAGNOSIS — Y838 Other surgical procedures as the cause of abnormal reaction of the patient, or of later complication, without mention of misadventure at the time of the procedure: Secondary | ICD-10-CM | POA: Diagnosis present

## 2014-07-21 DIAGNOSIS — R34 Anuria and oliguria: Secondary | ICD-10-CM | POA: Diagnosis not present

## 2014-07-21 DIAGNOSIS — Z9689 Presence of other specified functional implants: Secondary | ICD-10-CM

## 2014-07-21 DIAGNOSIS — D62 Acute posthemorrhagic anemia: Secondary | ICD-10-CM | POA: Diagnosis not present

## 2014-07-21 DIAGNOSIS — R5381 Other malaise: Secondary | ICD-10-CM | POA: Diagnosis not present

## 2014-07-21 DIAGNOSIS — R0602 Shortness of breath: Secondary | ICD-10-CM

## 2014-07-21 DIAGNOSIS — J9690 Respiratory failure, unspecified, unspecified whether with hypoxia or hypercapnia: Secondary | ICD-10-CM | POA: Diagnosis not present

## 2014-07-21 DIAGNOSIS — Z87891 Personal history of nicotine dependence: Secondary | ICD-10-CM

## 2014-07-21 DIAGNOSIS — J9600 Acute respiratory failure, unspecified whether with hypoxia or hypercapnia: Secondary | ICD-10-CM | POA: Diagnosis not present

## 2014-07-21 DIAGNOSIS — I5030 Unspecified diastolic (congestive) heart failure: Secondary | ICD-10-CM | POA: Diagnosis not present

## 2014-07-21 DIAGNOSIS — I4510 Unspecified right bundle-branch block: Secondary | ICD-10-CM | POA: Diagnosis present

## 2014-07-21 DIAGNOSIS — J90 Pleural effusion, not elsewhere classified: Secondary | ICD-10-CM | POA: Diagnosis not present

## 2014-07-21 HISTORY — DX: Thrombosis due to cardiac prosthetic devices, implants and grafts, initial encounter: T82.867A

## 2014-07-21 HISTORY — DX: Presence of xenogenic heart valve: Z95.3

## 2014-07-21 HISTORY — DX: Endocarditis, valve unspecified: I38

## 2014-07-21 HISTORY — DX: Other mechanical complication of heart valve prosthesis, initial encounter: T82.09XA

## 2014-07-21 LAB — COMPREHENSIVE METABOLIC PANEL
ALT: 49 U/L (ref 14–54)
AST: 72 U/L — ABNORMAL HIGH (ref 15–41)
Albumin: 3 g/dL — ABNORMAL LOW (ref 3.5–5.0)
Alkaline Phosphatase: 56 U/L (ref 38–126)
Anion gap: 16 — ABNORMAL HIGH (ref 5–15)
BUN: 57 mg/dL — ABNORMAL HIGH (ref 6–20)
CO2: 11 mmol/L — ABNORMAL LOW (ref 22–32)
Calcium: 10.8 mg/dL — ABNORMAL HIGH (ref 8.9–10.3)
Chloride: 112 mmol/L — ABNORMAL HIGH (ref 101–111)
Creatinine, Ser: 3.57 mg/dL — ABNORMAL HIGH (ref 0.44–1.00)
GFR calc Af Amer: 14 mL/min — ABNORMAL LOW (ref >60)
GFR calc non Af Amer: 12 mL/min — ABNORMAL LOW (ref >60)
Glucose, Bld: 179 mg/dL — ABNORMAL HIGH (ref 65–99)
Potassium: 5.1 mmol/L (ref 3.5–5.1)
Sodium: 139 mmol/L (ref 135–145)
Total Bilirubin: 2.4 mg/dL — ABNORMAL HIGH (ref 0.3–1.2)
Total Protein: 6.7 g/dL (ref 6.5–8.1)

## 2014-07-21 LAB — CBC
HCT: 43.6 % (ref 36.0–46.0)
Hemoglobin: 14.4 g/dL (ref 12.0–15.0)
MCH: 28.8 pg (ref 26.0–34.0)
MCHC: 33 g/dL (ref 30.0–36.0)
MCV: 87.2 fL (ref 78.0–100.0)
Platelets: 159 10*3/uL (ref 150–400)
RBC: 5 MIL/uL (ref 3.87–5.11)
RDW: 12.8 % (ref 11.5–15.5)
WBC: 8.2 10*3/uL (ref 4.0–10.5)

## 2014-07-21 LAB — BASIC METABOLIC PANEL
ANION GAP: 17 — AB (ref 5–15)
ANION GAP: 16 — AB (ref 5–15)
BUN: 58 mg/dL — AB (ref 6–20)
BUN: 58 mg/dL — ABNORMAL HIGH (ref 6–20)
CHLORIDE: 113 mmol/L — AB (ref 101–111)
CO2: 13 mmol/L — ABNORMAL LOW (ref 22–32)
CO2: 13 mmol/L — ABNORMAL LOW (ref 22–32)
CREATININE: 3.71 mg/dL — AB (ref 0.44–1.00)
Calcium: 8.8 mg/dL — ABNORMAL LOW (ref 8.9–10.3)
Calcium: 8 mg/dL — ABNORMAL LOW (ref 8.9–10.3)
Chloride: 114 mmol/L — ABNORMAL HIGH (ref 101–111)
Creatinine, Ser: 3.32 mg/dL — ABNORMAL HIGH (ref 0.44–1.00)
GFR calc Af Amer: 14 mL/min — ABNORMAL LOW (ref >60)
GFR calc Af Amer: 16 mL/min — ABNORMAL LOW (ref >60)
GFR calc non Af Amer: 13 mL/min — ABNORMAL LOW (ref >60)
GFR, EST NON AFRICAN AMERICAN: 12 mL/min — AB (ref >60)
Glucose, Bld: 220 mg/dL — ABNORMAL HIGH (ref 65–99)
Glucose, Bld: 214 mg/dL — ABNORMAL HIGH (ref 65–99)
POTASSIUM: 5.4 mmol/L — AB (ref 3.5–5.1)
Potassium: 4.1 mmol/L (ref 3.5–5.1)
Sodium: 144 mmol/L (ref 135–145)
Sodium: 142 mmol/L (ref 135–145)

## 2014-07-21 LAB — TROPONIN I
TROPONIN I: 1.33 ng/mL — AB (ref <0.031)
Troponin I: 1.28 ng/mL (ref <0.031)

## 2014-07-21 LAB — I-STAT ARTERIAL BLOOD GAS, ED
Acid-base deficit: 20 mmol/L — ABNORMAL HIGH (ref 0.0–2.0)
Acid-base deficit: 8 mmol/L — ABNORMAL HIGH (ref 0.0–2.0)
Bicarbonate: 10.4 mEq/L — ABNORMAL LOW (ref 20.0–24.0)
Bicarbonate: 15.6 mEq/L — ABNORMAL LOW (ref 20.0–24.0)
O2 Saturation: 100 %
O2 Saturation: 100 %
Patient temperature: 97.6
TCO2: 12 mmol/L (ref 0–100)
TCO2: 16 mmol/L (ref 0–100)
pCO2 arterial: 39.4 mmHg (ref 35.0–45.0)
pCO2 arterial: 25.9 mmHg — ABNORMAL LOW (ref 35.0–45.0)
pH, Arterial: 7.024 — CL (ref 7.350–7.450)
pH, Arterial: 7.39 (ref 7.350–7.450)
pO2, Arterial: 346 mmHg — ABNORMAL HIGH (ref 80.0–100.0)
pO2, Arterial: 294 mmHg — ABNORMAL HIGH (ref 80.0–100.0)

## 2014-07-21 LAB — ACETAMINOPHEN LEVEL: Acetaminophen (Tylenol), Serum: <10 ug/mL — ABNORMAL LOW (ref 10–30)

## 2014-07-21 LAB — I-STAT TROPONIN, ED: Troponin i, poc: 0.95 ng/mL (ref 0.00–0.08)

## 2014-07-21 LAB — POC OCCULT BLOOD, ED: Fecal Occult Bld: NEGATIVE

## 2014-07-21 LAB — I-STAT CG4 LACTIC ACID, ED: Lactic Acid, Venous: 7.77 mmol/L (ref 0.5–2.0)

## 2014-07-21 LAB — SALICYLATE LEVEL: Salicylate Lvl: <4 mg/dL (ref 2.8–30.0)

## 2014-07-21 LAB — PHOSPHORUS: PHOSPHORUS: 5.9 mg/dL — AB (ref 2.5–4.6)

## 2014-07-21 LAB — GLUCOSE, CAPILLARY: Glucose-Capillary: 156 mg/dL — ABNORMAL HIGH (ref 65–99)

## 2014-07-21 LAB — PROCALCITONIN: Procalcitonin: 0.31 ng/mL

## 2014-07-21 LAB — LACTIC ACID, PLASMA: Lactic Acid, Venous: 10.2 mmol/L (ref 0.5–2.0)

## 2014-07-21 LAB — PROTIME-INR
INR: 2.55 — ABNORMAL HIGH (ref 0.00–1.49)
Prothrombin Time: 27.1 seconds — ABNORMAL HIGH (ref 11.6–15.2)

## 2014-07-21 LAB — BRAIN NATRIURETIC PEPTIDE: B Natriuretic Peptide: 2554.2 pg/mL — ABNORMAL HIGH (ref 0.0–100.0)

## 2014-07-21 LAB — CORTISOL: Cortisol, Plasma: 50.7 ug/dL

## 2014-07-21 LAB — MAGNESIUM: Magnesium: 1.3 mg/dL — ABNORMAL LOW (ref 1.7–2.4)

## 2014-07-21 LAB — CBG MONITORING, ED: Glucose-Capillary: 154 mg/dL — ABNORMAL HIGH (ref 65–99)

## 2014-07-21 MED ORDER — SODIUM CHLORIDE 0.9 % IV BOLUS (SEPSIS)
2000.0000 mL | Freq: Once | INTRAVENOUS | Status: AC
  Administered 2014-07-21: 2000 mL via INTRAVENOUS

## 2014-07-21 MED ORDER — SODIUM POLYSTYRENE SULFONATE 15 GM/60ML PO SUSP
30.0000 g | Freq: Once | ORAL | Status: AC
  Administered 2014-07-21: 30 g
  Filled 2014-07-21: qty 120

## 2014-07-21 MED ORDER — SODIUM BICARBONATE 4 % IV SOLN
5.0000 mL | Freq: Once | INTRAVENOUS | Status: AC
  Administered 2014-07-21: 5 mL via INTRAVENOUS

## 2014-07-21 MED ORDER — PERFLUTREN LIPID MICROSPHERE
1.0000 mL | INTRAVENOUS | Status: AC | PRN
  Administered 2014-07-21: 4 mL via INTRAVENOUS
  Filled 2014-07-21: qty 10

## 2014-07-21 MED ORDER — CETYLPYRIDINIUM CHLORIDE 0.05 % MT LIQD
7.0000 mL | Freq: Four times a day (QID) | OROMUCOSAL | Status: DC
  Administered 2014-07-22 – 2014-07-25 (×13): 7 mL via OROMUCOSAL

## 2014-07-21 MED ORDER — EPINEPHRINE HCL 1 MG/ML IJ SOLN
0.5000 ug/min | INTRAMUSCULAR | Status: DC
  Administered 2014-07-21: 4 ug/min via INTRAVENOUS
  Filled 2014-07-21: qty 4

## 2014-07-21 MED ORDER — SUCCINYLCHOLINE CHLORIDE 20 MG/ML IJ SOLN
INTRAMUSCULAR | Status: AC
  Filled 2014-07-21: qty 1

## 2014-07-21 MED ORDER — INSULIN ASPART 100 UNIT/ML ~~LOC~~ SOLN
2.0000 [IU] | SUBCUTANEOUS | Status: DC
  Administered 2014-07-21: 6 [IU] via SUBCUTANEOUS
  Administered 2014-07-21: 4 [IU] via SUBCUTANEOUS
  Administered 2014-07-22: 2 [IU] via SUBCUTANEOUS
  Administered 2014-07-22: 4 [IU] via SUBCUTANEOUS

## 2014-07-21 MED ORDER — FUROSEMIDE 10 MG/ML IJ SOLN
40.0000 mg | Freq: Once | INTRAMUSCULAR | Status: AC
  Administered 2014-07-21: 40 mg via INTRAVENOUS
  Filled 2014-07-21: qty 4

## 2014-07-21 MED ORDER — HEPARIN (PORCINE) IN NACL 100-0.45 UNIT/ML-% IJ SOLN
1100.0000 [IU]/h | INTRAMUSCULAR | Status: DC
  Administered 2014-07-21: 1000 [IU]/h via INTRAVENOUS
  Filled 2014-07-21: qty 250

## 2014-07-21 MED ORDER — FENTANYL CITRATE (PF) 100 MCG/2ML IJ SOLN
50.0000 ug | INTRAMUSCULAR | Status: DC | PRN
  Administered 2014-07-21 – 2014-07-23 (×3): 50 ug via INTRAVENOUS
  Filled 2014-07-21 (×3): qty 2

## 2014-07-21 MED ORDER — LIDOCAINE HCL (CARDIAC) 20 MG/ML IV SOLN
INTRAVENOUS | Status: AC
  Filled 2014-07-21: qty 5

## 2014-07-21 MED ORDER — HEPARIN SODIUM (PORCINE) 5000 UNIT/ML IJ SOLN: 5000.0000 [IU] | Freq: Three times a day (TID) | INTRAMUSCULAR | Status: DC

## 2014-07-21 MED ORDER — EPINEPHRINE HCL 0.1 MG/ML IJ SOSY
PREFILLED_SYRINGE | INTRAMUSCULAR | Status: AC | PRN
  Administered 2014-07-21 (×2): 1 mg via INTRAVENOUS
  Administered 2014-07-21: 1 via INTRAVENOUS

## 2014-07-21 MED ORDER — FAMOTIDINE IN NACL 20-0.9 MG/50ML-% IV SOLN
20.0000 mg | Freq: Two times a day (BID) | INTRAVENOUS | Status: DC
  Filled 2014-07-21: qty 50

## 2014-07-21 MED ORDER — STERILE WATER FOR INJECTION IV SOLN
INTRAVENOUS | Status: DC
  Administered 2014-07-21 – 2014-07-22 (×2): via INTRAVENOUS
  Filled 2014-07-21 (×4): qty 850

## 2014-07-21 MED ORDER — SODIUM CHLORIDE 0.9 % IV SOLN: INTRAVENOUS | Status: DC

## 2014-07-21 MED ORDER — SODIUM CHLORIDE 0.9 % IV BOLUS (SEPSIS)
1000.0000 mL | Freq: Once | INTRAVENOUS | Status: AC
  Administered 2014-07-21: 1000 mL via INTRAVENOUS

## 2014-07-21 MED ORDER — VANCOMYCIN HCL 10 G IV SOLR
1250.0000 mg | Freq: Once | INTRAVENOUS | Status: AC
  Administered 2014-07-21: 1250 mg via INTRAVENOUS
  Filled 2014-07-21: qty 1250

## 2014-07-21 MED ORDER — ETOMIDATE 2 MG/ML IV SOLN
INTRAVENOUS | Status: AC
  Filled 2014-07-21: qty 20

## 2014-07-21 MED ORDER — FENTANYL CITRATE (PF) 100 MCG/2ML IJ SOLN
50.0000 ug | INTRAMUSCULAR | Status: AC | PRN
  Administered 2014-07-22 (×3): 50 ug via INTRAVENOUS
  Filled 2014-07-21 (×2): qty 2

## 2014-07-21 MED ORDER — SODIUM BICARBONATE 8.4 % IV SOLN: INTRAVENOUS | Status: DC

## 2014-07-21 MED ORDER — SODIUM CHLORIDE 0.9 % IV SOLN
250.0000 mg | Freq: Two times a day (BID) | INTRAVENOUS | Status: DC
  Administered 2014-07-21 – 2014-07-23 (×4): 250 mg via INTRAVENOUS
  Filled 2014-07-21 (×5): qty 250

## 2014-07-21 MED ORDER — CHLORHEXIDINE GLUCONATE 0.12 % MT SOLN
15.0000 mL | Freq: Two times a day (BID) | OROMUCOSAL | Status: DC
  Administered 2014-07-21 – 2014-07-24 (×7): 15 mL via OROMUCOSAL
  Filled 2014-07-21 (×7): qty 15

## 2014-07-21 MED ORDER — PROPOFOL 1000 MG/100ML IV EMUL: 5.0000 ug/kg/min | Freq: Once | INTRAVENOUS | Status: DC

## 2014-07-21 MED ORDER — VASOPRESSIN 20 UNIT/ML IV SOLN
0.0300 [IU]/min | INTRAVENOUS | Status: DC
  Administered 2014-07-21: 0.03 [IU]/min via INTRAVENOUS
  Filled 2014-07-21 (×2): qty 2

## 2014-07-21 MED ORDER — SODIUM BICARBONATE 8.4 % IV SOLN
INTRAVENOUS | Status: AC | PRN
  Administered 2014-07-21 (×2): 50 meq via INTRAVENOUS

## 2014-07-21 MED ORDER — LEVOFLOXACIN IN D5W 750 MG/150ML IV SOLN: 750.0000 mg | Freq: Once | INTRAVENOUS | Status: DC

## 2014-07-21 MED ORDER — ETOMIDATE 2 MG/ML IV SOLN
INTRAVENOUS | Status: AC | PRN
  Administered 2014-07-21: 20 mg via INTRAVENOUS

## 2014-07-21 MED ORDER — HYDROCORTISONE NA SUCCINATE PF 100 MG IJ SOLR
50.0000 mg | Freq: Four times a day (QID) | INTRAMUSCULAR | Status: DC
  Administered 2014-07-21 – 2014-07-23 (×8): 50 mg via INTRAVENOUS
  Filled 2014-07-21 (×5): qty 1
  Filled 2014-07-21: qty 2
  Filled 2014-07-21 (×2): qty 1
  Filled 2014-07-21: qty 2
  Filled 2014-07-21: qty 1
  Filled 2014-07-21: qty 2

## 2014-07-21 MED ORDER — ASPIRIN 81 MG PO CHEW
324.0000 mg | CHEWABLE_TABLET | Freq: Once | ORAL | Status: DC
Start: 2014-07-21 — End: 2014-07-21

## 2014-07-21 MED ORDER — AZTREONAM 2 G IJ SOLR
2.0000 g | Freq: Once | INTRAMUSCULAR | Status: DC
  Filled 2014-07-21: qty 2

## 2014-07-21 MED ORDER — SODIUM CHLORIDE 0.9 % IV SOLN
250.0000 mL | INTRAVENOUS | Status: DC | PRN
  Administered 2014-07-25 (×4): via INTRAVENOUS

## 2014-07-21 MED ORDER — ROCURONIUM BROMIDE 50 MG/5ML IV SOLN
INTRAVENOUS | Status: AC
  Filled 2014-07-21: qty 2

## 2014-07-21 MED ORDER — FAMOTIDINE IN NACL 20-0.9 MG/50ML-% IV SOLN
20.0000 mg | Freq: Two times a day (BID) | INTRAVENOUS | Status: DC
  Administered 2014-07-21 – 2014-07-22 (×2): 20 mg via INTRAVENOUS
  Filled 2014-07-21 (×3): qty 50

## 2014-07-21 MED ORDER — NOREPINEPHRINE BITARTRATE 1 MG/ML IV SOLN
0.0000 ug/min | INTRAVENOUS | Status: DC
  Administered 2014-07-21: 5 ug/min via INTRAVENOUS
  Filled 2014-07-21: qty 4

## 2014-07-21 MED ORDER — NOREPINEPHRINE BITARTRATE 1 MG/ML IV SOLN
0.0000 ug/min | INTRAVENOUS | Status: DC
  Administered 2014-07-21: 50 ug/min via INTRAVENOUS
  Administered 2014-07-23: 2 ug/min via INTRAVENOUS
  Filled 2014-07-21 (×3): qty 16

## 2014-07-21 MED ORDER — HYDROCORTISONE NA SUCCINATE PF 100 MG IJ SOLR: 50.0000 mg | Freq: Four times a day (QID) | INTRAMUSCULAR | Status: DC

## 2014-07-21 MED ORDER — SODIUM BICARBONATE 8.4 % IV SOLN
INTRAVENOUS | Status: AC
  Administered 2014-07-21: 50 meq
  Filled 2014-07-21: qty 100

## 2014-07-21 MED ORDER — ROCURONIUM BROMIDE 50 MG/5ML IV SOLN
INTRAVENOUS | Status: AC | PRN
  Administered 2014-07-21: 80 mg via INTRAVENOUS

## 2014-07-21 MED ORDER — SODIUM CHLORIDE 0.9 % IV BOLUS (SEPSIS): 2000.0000 mL | Freq: Once | INTRAVENOUS | Status: DC

## 2014-07-21 MED ORDER — CLINDAMYCIN PHOSPHATE 600 MG/50ML IV SOLN: 600.0000 mg | Freq: Once | INTRAVENOUS | Status: DC

## 2014-07-21 MED ORDER — DEXTROSE 5 % IV SOLN
2.0000 g | Freq: Once | INTRAVENOUS | Status: DC
  Administered 2014-07-21: 2 g via INTRAVENOUS
  Filled 2014-07-21: qty 2

## 2014-07-21 NOTE — Code Documentation (Signed)
Continued bagging pt while preparations are being made to intubate pt.

## 2014-07-21 NOTE — Progress Notes (Signed)
Echocardiogram 2D Echocardiogram with Definity has been performed.  [name] 07/21/2014, 5:17 PM

## 2014-07-21 NOTE — Progress Notes (Signed)
eLink Physician-Brief Progress Note Patient Name: [name] DOB: [birth year] MRN: [id number]   Date of Service  07/21/2014  HPI/Events of Note  Oliguria. Still no urine output. CVP = 14 post last bolus. Creatinine = 3.32  eICU Interventions  Will order Lasix 40 mg IV X 1.     Intervention Category Intermediate Interventions: Oliguria - evaluation and management  Sommer,Steven Eugene 07/21/2014, 9:31 PM

## 2014-07-21 NOTE — Progress Notes (Signed)
eLink Physician-Brief Progress Note Patient Name: [name] DOB: 01-24-1945 MRN: [id number]   Date of Service  07/21/2014  HPI/Events of Note  Lactic Acid at 4:30 PM = 10.2. CVP = 17. BMP pending.  eICU Interventions  Will order:  1. Bolus with 0.9 NaCl 1 liter IV over 1 hour now.  2. Check ABG at midnight 07/22/2014.  3. Check Lactic Acid at 9:30 PM and Q 3 hours X 4.     Intervention Category Major Interventions: Acid-Base disturbance - evaluation and management;Sepsis - evaluation and management;Shock - evaluation and management  Lenell Antu 07/21/2014, 8:07 PM

## 2014-07-21 NOTE — ED Provider Notes (Signed)
Medical screening examination/treatment/procedure(s) were conducted as a shared visit with non-physician practitioner(s) and myself.  I personally evaluated the patient during the encounter.   EKG Interpretation   Date/Time:  Thursday July 21 2014 13:20:08 EDT Ventricular Rate:  94 PR Interval:  164 QRS Duration: 169 QT Interval:  420 QTC Calculation: 525 R Axis:   -167 Text Interpretation:  Sinus rhythm RBBB and LPFB Inferior infarct, old ED  PHYSICIAN INTERPRETATION AVAILABLE IN [facility] Confirmed by TEST,  Record ([id number]) on 07/22/2014 7:24:00 AM     68yF coming from home. 2-3 day history of decreased mental status. On my initial exam pt looked ill. Tachypneic, but lungs clear. Hypotensive w/o compensatory tachycardia. Asking repeatedly to go home. Would follow simple commands such as track with eyes and squeeze fingers. Globally weak. No focal deficit.  Persistent hypotension despite aggressive IVF. Metabolic acidosis. Lactate 7.7. ARF. Troponin elevated but EKG w/o overt ischemic changes.  Undifferentiated shock. Afebrile. No leukocytosis. Empiric abx ordered for possible sepsis although clinically this seems less likely. PCN allergy. No reported trauma. Antiocoagulated on warfarin, but not anemic. Troponin elevated. EKG w/o STEMI. Demand ischemia from hypotension? With ongoing symptoms for days, would expect troponin to be significantly higher at this point. Will discuss with cardiology. ECHO?   Pt CODED in ED. Pt became unresponsive. Increasingly bradycardic and then PEA.  CPR. Epi/bicarb. Intubated.  PEA arrest again shortly after intubated. Emergently placed R femoral central line for pressor support. Dsicussed with CCM. Critically ill.    INTUBATION Performed by: Raeford Razor  Required items: required blood products, implants, devices, and special equipment available Patient identity confirmed: provided demographic data and hospital-assigned identification number Time  out: Immediately prior to procedure a "time out" was called to verify the correct patient, procedure, equipment, support staff and site/side marked as required.  Indications: airway protection/respiratory failure  Intubation method: Glidescope Laryngoscopy   Preoxygenation: BVM  Sedatives: Etomidate Paralytic: Rocuronium  Tube Size: 7.5 cuffed  Post-procedure assessment: chest rise and ETCO2 monitor Breath sounds: equal and absent over the epigastrium Tube secured with: ETT holder Chest x-ray interpreted by radiologist and me.  Chest x-ray findings: endotracheal tube in appropriate position  Patient tolerated the procedure well with no immediate complications.   CENTRAL LINE Performed by: Raeford Razor Consent: The procedure was performed in an emergent situation. Required items: required blood products, implants, devices, and special equipment available Patient identity confirmed: arm band and provided demographic data Time out: Immediately prior to procedure a "time out" was called to verify the correct patient, procedure, equipment, support staff and site/side marked as required. Indications: vascular access Anesthesia: local infiltration Local anesthetic: lidocaine 1% w/o epi Anesthetic total: 3 ml Patient sedated: intubated prior to line placement Preparation: skin prepped with 2% chlorhexidine Skin prep agent dried: skin prep agent completely dried prior to procedure Sterile barriers: all five maximum sterile barriers used - cap, mask, sterile gloves. Hand hygiene: hand hygiene performed prior to central venous catheter insertion  Location details: R femoral  Catheter type: triple lumen Catheter size: 8 Fr Pre-procedure: landmarks identified Ultrasound guidance: yes Successful placement: yes Post-procedure: line sutured and dressing applied Assessment: blood return through all parts, free fluid flow Patient tolerance: Patient tolerated the procedure well with no  immediate complications.  Cardiopulmonary Resuscitation (CPR) Procedure Note Directed/Performed by: Raeford Razor I personally directed ancillary staff and/or performed CPR in an effort to regain return of spontaneous circulation and to maintain cardiac, neuro and systemic perfusion.    CRITICAL  CARE Performed by: Raeford Razor Total critical care time: 90 minutes Critical care time was exclusive of separately billable procedures and treating other patients. Critical care was necessary to treat or prevent imminent or life-threatening deterioration. Critical care was time spent personally by me on the following activities: development of treatment plan with patient and/or surrogate as well as nursing, discussions with consultants, evaluation of patient's response to treatment, examination of patient, obtaining history from patient or surrogate, ordering and performing treatments and interventions, ordering and review of laboratory studies, ordering and review of radiographic studies, pulse oximetry and re-evaluation of patient's condition.       Raeford Razor, MD 07/22/14 [phone]

## 2014-07-21 NOTE — ED Notes (Signed)
MD at bedside. 

## 2014-07-21 NOTE — ED Notes (Signed)
Pt presents with hypotension and diarrhea X 1 day and "not eating and drinking" X 2 days. Pt denies N/V/ fevers. Pt is from home per daughter pt is currently not taking her home medication.

## 2014-07-21 NOTE — Code Documentation (Signed)
Chaplain is with family.

## 2014-07-21 NOTE — ED Notes (Signed)
MD at bedside. Admitting MD.

## 2014-07-21 NOTE — Progress Notes (Signed)
CRITICAL VALUE ALERT  Critical value received:  LA 6  Date of notification:  07/21/2014  Time of notification:  2155  Critical value read back:Yes.    Nurse who received alert:  Lillia Corporal  MD notified (1st page):  Revonda Standard CCM resident  Time of first page:  2200  MD notified (2nd page):  Time of second page:  Responding MD:  Revonda Standard CCM resident  Time MD responded:  2200

## 2014-07-21 NOTE — Code Documentation (Signed)
No pulses, initiated compressions.

## 2014-07-21 NOTE — Consult Note (Signed)
CARDIOLOGY CONSULT NOTE   Patient ID: [name] MRN: [id number], DOB/AGE: 67-15-1947   Admit date: 07/21/2014 Date of Consult: 07/21/2014   Primary Physician: No PCP Per Patient Primary Cardiologist: Dr. Rennis Golden  Pt. Profile  69 year old married female with past medical history of hypertension, hyperlipidemia, remote history of endocarditis with destruction of mitral valve requiring 29 mm St. Jude mechanical valve placement by Dr. Cornelius Moras in 04/1998 present with AMS and malaise and 2 witnessed PEA arrest in the ED  Problem List  Past Medical History  Diagnosis Date  . Hypertension   . Hyperlipidemia   . S/P MVR (mitral valve replacement) 04/1998    #29 St. Jude  . Valvular endocarditis     Past Surgical History  Procedure Laterality Date  . Mitral valve replacement  04/11/1998    St. Jude mechanical MVR (severe MR, episode of streptococcal bacterial endocarditis - Dr. Cornelius Moras  . Tubal ligation    . Transthoracic echocardiogram  11/2009    EF=>55%; bi-leaflet St. Jude mech prosthesis; mild TR, RVSP elevated at 40-56mmHg, mod pulm HTN; trace AV regurg; mild pulm valve regurg     Allergies  Allergies  Allergen Reactions  . Augmentin [Amoxicillin-Pot Clavulanate]     HPI   The patient is a 69 year old married female with past medical history of hypertension, hyperlipidemia, remote history of endocarditis with destruction of mitral valve requiring 29 mm St. Jude mechanical valve placement by Dr. Cornelius Moras in 04/1998. She has been doing relatively well since. The last time she saw Dr. Rennis Golden was in March 2016, at which time she was noted to have new right bundle branch block of uncertain importance. She was also having compliance issue with her medication, emphasis has been placed on compliance with medication.   She presented to [facility] on 07/21/2014 complaining of malaise, and altered mental status. She subsequently had PEA arrest 2 while in the ED. According to telemetry,  she bradycardia down to the 20s to 30s prior to the PDA at rest. Per nursing staff, she was severely diaphoretic on arrival. Due to her current mental status, it is unknown at this time whether or not she had any chest pain prior to arrival. Significant lab finding on arrival include creatinine of 3.57 which is markedly elevated compared to the previous creatinine of 1.5 one year ago. Her troponin was 0.95. Initial lactic acid was elevated at 7.77. INR was 2.55 therapeutic. EKG showed right bundle branch block and left anterior fascicular block consistent with previous EKG on 03/11/2014. As noted before, the right bundle branch block is new when compared to the previous EKG obtained in January 2015. ABG obtained in the ED showed pH 7.0, PCO2 39.4, O2 346. She was intubated in the ED. Chest x-ray showed evidence of underlying congestive heart failure versus other pulmonary disease process. Cardiology has been consulted for PEA arrest.  Inpatient Medications  . etomidate      . etomidate      . lidocaine (cardiac) 100 mg/25ml      . lidocaine (cardiac) 100 mg/62ml      . rocuronium      . rocuronium      . succinylcholine      . succinylcholine        Family History Family History  Problem Relation Age of Onset  . Lung cancer Mother   . Heart failure Maternal Grandmother   . Heart failure Maternal Grandfather      Social History History  Social History  . Marital Status: Divorced    Spouse Name: N/A  . Number of Children: 3  . Years of Education: 14   Occupational History  . Not on file.   Social History Main Topics  . Smoking status: Former Smoker    Quit date: 01/17/1973  . Smokeless tobacco: Never Used  . Alcohol Use: No  . Drug Use: No  . Sexual Activity: Not on file   Other Topics Concern  . Not on file   Social History Narrative     Review of Systems  Unable to obtain, patient is intubated and sedated  Physical Exam  Blood pressure 73/39, pulse 64, temperature  97.8 F (36.6 C), resp. rate 28, height 5\' 6"  (1.676 m), SpO2 97 %.  General: Intubated, sedated Lungs:  Intubated, expiratory weezhing Heart: RRR, no obvious murmur Abdomen: Soft, non-distended.  Extremities: No clubbing, cyanosis or edema. Could not assess pulse. Extremity cool  Labs  No results for input(s): CKTOTAL, CKMB, TROPONINI in the last 72 hours. Lab Results  Component Value Date   WBC 8.2 07/21/2014   HGB 14.4 07/21/2014   HCT 43.6 07/21/2014   MCV 87.2 07/21/2014   PLT 159 07/21/2014    Recent Labs Lab 07/21/14 1207  NA 139  K 5.1  CL 112*  CO2 11*  BUN 57*  CREATININE 3.57*  CALCIUM 10.8*  PROT 6.7  BILITOT 2.4*  ALKPHOS 56  ALT 49  AST 72*  GLUCOSE 179*   Lab Results  Component Value Date   CHOL 169 02/17/2013   HDL 50 02/17/2013   LDLCALC 99 02/17/2013   TRIG 99 02/17/2013   No results found for: DDIMER  Radiology/Studies  Dg Chest Portable 1 View  07/21/2014   CLINICAL DATA:  Cardiac arrest.  Hypoxia.  EXAM: PORTABLE CHEST - 1 VIEW  COMPARISON:  January 13, 2007  FINDINGS: The endotracheal tube tip is at the level of T1, approximately 11 cm above the carina. Nasogastric tube tip is in the stomach with the side port at the gastroesophageal junction. No pneumothorax. There is interstitial and patchy alveolar edema throughout the lungs bilaterally, slightly more on the right than on the left. Heart is enlarged with pulmonary venous hypertension. Patient is status post mitral valve replacement.  IMPRESSION: Tube positions as described without pneumothorax. Note that both the endotracheal tube and nasogastric tube may warrant advancing. There is evidence of underlying congestive heart failure.   Electronically Signed   By: Bretta Bang III M.D.   On: 07/21/2014 13:56    ECG  NSR with RBBB and LAFB  ASSESSMENT AND PLAN  1. PEA arrest x2 in the ED likely from septic shock, less likely cardiogenic  - CXR shows white out of bilateral lung, ?ARDS  vs PNA vs HF. Patient intubated  - severely acidotic with pH 7.0 and lactic acid 7.7  - EF normal on echo 02/17/2013, mechanical MV seats well, no paravalvular leak. Pending stat echo to assess mitral valve, LV dunction  - critical care management with intubation and pressors for now, she is critically ill, cardiology service will followup  2. Acute on chronic renal insufficiency  3. H/o SBE with destruction of mitral valve s/p 29 mm St Jude mechanical mitral valve by Dr. Cornelius Moras 4. HTN 5. HLD  Signed, Azalee Course, PA-C 07/21/2014, 2:04 PM

## 2014-07-21 NOTE — Progress Notes (Signed)
   07/21/14 1300  Clinical Encounter Type  Visited With Family;Health care provider  Visit Type Initial;Code  Referral From Nurse  Consult/Referral To Chaplain  Spiritual Encounters  Spiritual Needs Ritual;Emotional  Ch paged to ER; CH responded to liaison with family and ED; Son of PT was emotional and security was called; Son is now calm and family is working closely with [facility] and ED staff and awaiting MD for update; PT likely moving to ICU.

## 2014-07-21 NOTE — Code Documentation (Signed)
Pulse check, no pulse, compressions continued  

## 2014-07-21 NOTE — Procedures (Signed)
Arterial Catheter Insertion Procedure Note [name] [id number] [birth year]  Procedure: Insertion of Arterial Catheter  Indications: Blood pressure monitoring and Frequent blood sampling  Procedure Details Consent: Risks of procedure as well as the alternatives and risks of each were explained to the (patient/caregiver).  Consent for procedure obtained. Time Out: Verified patient identification, verified procedure, site/side was marked, verified correct patient position, special equipment/implants available, medications/allergies/relevent history reviewed, required imaging and test results available.  Performed  Maximum sterile technique was used including antiseptics, cap, gloves, gown, hand hygiene, mask and sheet. Skin prep: Chlorhexidine; local anesthetic administered 20 gauge catheter was inserted into left femoral artery using the Seldinger technique.  Evaluation Blood flow good; BP tracing good. Complications: No apparent complications.   [name] 07/21/2014

## 2014-07-21 NOTE — Procedures (Signed)
Central Venous Catheter Insertion Procedure Note Darrelyn Hillock [id number] 06/08/1945  Procedure: Insertion of Central Venous Catheter Indications: Assessment of intravascular volume, Drug and/or fluid administration and Frequent blood sampling  Procedure Details Consent: Risks of procedure as well as the alternatives and risks of each were explained to the (patient/caregiver).  Consent for procedure obtained. Time Out: Verified patient identification, verified procedure, site/side was marked, verified correct patient position, special equipment/implants available, medications/allergies/relevent history reviewed, required imaging and test results available.  Performed  Maximum sterile technique was used including antiseptics, cap, gloves, gown, hand hygiene, mask and sheet. Skin prep: Chlorhexidine; local anesthetic administered A antimicrobial bonded/coated triple lumen catheter was placed in the left internal jugular vein using the Seldinger technique.  Evaluation Blood flow good Complications: No apparent complications Patient did tolerate procedure well. Chest X-ray ordered to verify placement.  CXR: pending.  Nelda Bucks 07/21/2014, 2:35 PM  [place]  Mcarthur Rossetti. Tyson Alias, MD, FACP Pgr: [phone] [facility]

## 2014-07-21 NOTE — Progress Notes (Signed)
ANTIBIOTIC CONSULT NOTE - INITIAL  Pharmacy Consult for Levaquin / Imipenem and heparin Indication: sepsis and MVR  Allergies  Allergen Reactions  . Augmentin [Amoxicillin-Pot Clavulanate]     Patient Measurements: Height: 5\' 6"  (167.6 cm) IBW/kg (Calculated) : 59.3  TBW ~70 kg  Vital Signs: Temp: 97.8 F (36.6 C) (07/14 1051) BP: 73/39 mmHg (07/14 1330) Pulse Rate: 64 (07/14 1051) Intake/Output from previous day:   Intake/Output from this shift:    Labs:  Recent Labs  07/21/14 1207  WBC 8.2  HGB 14.4  PLT 159  CREATININE 3.57*   CrCl cannot be calculated (Unknown ideal weight.). No results for input(s): VANCOTROUGH, VANCOPEAK, VANCORANDOM, GENTTROUGH, GENTPEAK, GENTRANDOM, TOBRATROUGH, TOBRAPEAK, TOBRARND, AMIKACINPEAK, AMIKACINTROU, AMIKACIN in the last 72 hours.   Microbiology: No results found for this or any previous visit (from the past 720 hour(s)).  Medical History: Past Medical History  Diagnosis Date  . Hypertension   . Hyperlipidemia   . S/P MVR (mitral valve replacement) 04/1998    #29 St. Jude  . Valvular endocarditis     Assessment: 69 yo F presents on 7/14 with weakness, diarrhea, and AMS. Pharmacy consulted to dose Levaquin for sepsis. SCr elevated at 3.57, CrCl ~15-21ml/min. Blood  cx's pending. Afebrile, WBC wnl. Was given a dose of aztreonam and vancomycin in the ED.  Pt on coumadin PTA for MVR. INR therapeutic at 2.55 on admit. Holding coumadin. Will initiate heparin gtt per pharmacy. CBC stable.  Goal of Therapy:  Resolution of infection  Plan:  Give levaquin 750mg  IV x 1, then start 500mg  IV Q48 Start imipenem 250mg  IV Q12 Monitor clinical picture, renal function F/U C&S, abx deescalation / LOT  No heparin BOLUS (due to therapeutic INR) Start heparin gtt at 1,000 units/hr Check 8hr HL at 2330 tonight Monitor daily HL, CBC, s/s of bleed   Aristea Posada J 07/21/2014,2:18 PM

## 2014-07-21 NOTE — H&P (Signed)
PULMONARY / CRITICAL CARE MEDICINE   Name: [name] MRN: [id number] DOB: 1945/07/24    ADMISSION DATE:  07-23-14 CONSULTATION DATE:  07/23/22  REFERRING MD :  Bonney Roussel EDP  CHIEF COMPLAINT:  Shock  INITIAL PRESENTATION: 69 year old female presented to ED July 23, 2022 with AMS. In ED was profoundly hypotensive with elevated lactic, and unfortunately suffered 2 PEA arrests totaling approximately 5 minutes in duration. Post arrest she is in refractory shock despite 4L IVF resuscitation and 2 vasoactive agents. PCCM to admit.  STUDIES:  CT head 07-23-2022 > Echo 07/23/22 >  SIGNIFICANT EVENTS: 23-Jul-2022 coded in ED, PEA arrest 6 mins. PCCM admit to ICU intubated.    HISTORY OF PRESENT ILLNESS:  69 year old female with PMH MVR on coumadin, HTN, and HLD. She is reportedly non-complaint with her medical regimen at times, sometimes making too much, other times not enough. Family reports that she has had generalized weakness and malaise for several days prior to admission. She has also had diarrhea. 2022/07/23 she developed marked confusion and she presented to [facility] ED with these complaints. She was found to be profoundly hypoxemic with lactic acid elevation. Unfortunately this progressed to PEA arrest x 2. Total downtime approximated at about 5-6 mins. ROSC with epi and bicarb each time. Post arrest she remains with refractory shock despite 4L IVF and high doses of NE and epi. PCCM called for admission.   PAST MEDICAL HISTORY :   has a past medical history of Hypertension; Hyperlipidemia; and S/P MVR (mitral valve replacement) (04/1998).  has past surgical history that includes Mitral valve replacement (04/11/1998); Tubal ligation; and transthoracic echocardiogram (11/2009). Prior to Admission medications   Medication Sig Start Date End Date Taking? Authorizing Provider  carvedilol (COREG) 6.25 MG tablet Take 1 tablet (6.25 mg total) by mouth 2 (two) times daily with a meal. 03/11/14  Yes Chrystie Nose, MD  lisinopril  (PRINIVIL,ZESTRIL) 20 MG tablet Take 1 tablet (20 mg total) by mouth daily. 03/11/14  Yes Chrystie Nose, MD  simvastatin (ZOCOR) 40 MG tablet Take 1 tablet (40 mg total) by mouth daily. 03/11/14  Yes Chrystie Nose, MD  triamterene-hydrochlorothiazide (DYAZIDE) 37.5-25 MG per capsule Take 1 each (1 capsule total) by mouth daily. 03/11/14  Yes Chrystie Nose, MD  warfarin (COUMADIN) 3 MG tablet TAKE ONE TO ONE & ONE-HALF TABLETS BY MOUTH ONCE DAILY AS DIRECTED 12/24/13   Chrystie Nose, MD   Allergies  Allergen Reactions  . Augmentin [Amoxicillin-Pot Clavulanate]     FAMILY HISTORY:  indicated that her mother is deceased. She indicated that her father is deceased. She indicated that her maternal grandmother is deceased. She indicated that her maternal grandfather is deceased. She indicated that her paternal grandmother is deceased. She indicated that her paternal grandfather is deceased.  SOCIAL HISTORY:  reports that she quit smoking about 41 years ago. She has never used smokeless tobacco. She reports that she does not drink alcohol or use illicit drugs.  REVIEW OF SYSTEMS:  unable  SUBJECTIVE:   VITAL SIGNS: Temp:  [97.8 F (36.6 C)] 97.8 F (36.6 C) Jul 23, 2022 1051) Pulse Rate:  [64] 64 2022-07-23 1051) Resp:  [18-28] 28 Jul 23, 2022 1330) BP: (48-129)/(17-92) 73/39 mmHg 2022-07-23 1330) SpO2:  [97 %-100 %] 97 % Jul 23, 2022 1300) FiO2 (%):  [100 %] 100 % Jul 23, 2022 1305) HEMODYNAMICS:   VENTILATOR SETTINGS: Vent Mode:  [-] PRVC FiO2 (%):  [100 %] 100 % Set Rate:  [28 bmp] 28 bmp Vt Set:  [  480 mL] 480 mL PEEP:  [5 cmH20] 5 cmH20 Plateau Pressure:  [21 cmH20] 21 cmH20 INTAKE / OUTPUT: No intake or output data in the 24 hours ending 07/21/14 1349  PHYSICAL EXAMINATION: General:  S/p arrest, agonal on vent Neuro:  Per 67mm, sluggish,. unresposnove HEENT:  jvd flat Cardiovascular:  s1 s2 RRR , distant Lungs:  Coarse ronchi Abdomen:  Soft, BS wnl, no r/g Musculoskeletal:  No edema Skin:  No  rash  LABS:  CBC  Recent Labs Lab 07/21/14 1207  WBC 8.2  HGB 14.4  HCT 43.6  PLT 159   Coag's  Recent Labs Lab 07/21/14 1207  INR 2.55*   BMET  Recent Labs Lab 07/21/14 1207  NA 139  K 5.1  CL 112*  CO2 11*  BUN 57*  CREATININE 3.57*  GLUCOSE 179*   Electrolytes  Recent Labs Lab 07/21/14 1207  CALCIUM 10.8*   Sepsis Markers  Recent Labs Lab 07/21/14 1218  LATICACIDVEN 7.77*   ABG  Recent Labs Lab 07/21/14 1306  PHART 7.024*  PCO2ART 39.4  PO2ART 346.0*   Liver Enzymes  Recent Labs Lab 07/21/14 1207  AST 72*  ALT 49  ALKPHOS 56  BILITOT 2.4*  ALBUMIN 3.0*   Cardiac Enzymes No results for input(s): TROPONINI, PROBNP in the last 168 hours. Glucose  Recent Labs Lab 07/21/14 1211  GLUCAP 154*    Imaging No results found.   ASSESSMENT / PLAN:  PULMONARY OETT 7/14 >>> A: Acute hypoxic respiratory failure Diffuse pulmonary infiltrates ?ARDS  P:   Full vent support Defer ARDS protocol for now Lung protective vent with 8cc/kg Vt CXR ABG VAP bundle  CARDIOVASCULAR CVL 7/14 >>> A:  Shock, suspect septic S/p MVR in 2000, on coumadin Hx HTN, HLD  P:  Telemetry MAP goal > 65 mm/Hg Epi, NE, vaso for map goal IVF resuscitation CVP monitoring ScvO2 2D echo Ensure lactic clearing Not candidate for therapeutic hypothermia  RENAL A:   AKI AG metabolic acidosis, lactic Mild hypercalcemia  P:   Follow Bmet  GASTROINTESTINAL A:   Transaminitis , suspect shock liver Abdominal pain  P:   NPO Pepcid for SUP CT abdomen  HEMATOLOGIC A:   Coumadin coagulopathy  P:  Hold coumadin Follow coags Start heparin infusion per pharmacy  INFECTIOUS A:   Septic shock, source unclear at this time  P:   BCx2 7/14 >>> UC 7/14 >>> Sputum 7/14 >>> Flu 7/14 >>> RVP 7/14>>> Urine strep 7/14 >>> Urine legionella 7/14 >>> C-dif 7/14 >>>  Abx: Imipenem, start date 7/14 >>> Abx: Levofloxacin, start date 7/14  >>> Abx: Vancomycin, start date 7/14 >>> PCT algorithm  ENDOCRINE A:   AI?  P:   CBG monitoring and SSI TSH Cortisol Stress dose steroids  NEUROLOGIC A:   Acute encephalopathy, multifactorial metabolic, anoxic   P:   RASS goal: -1 Fentanyl PRN for analgesia/sedation CT head Monitor   FAMILY  - Updates: Family update by DF 7/14  - Inter-disciplinary family meet or Palliative Care meeting due by:  7/21   Joneen Roach, AGACNP-BC [facility] Pager [phone] or [phone]  07/21/2014 2:30 PM   STAFF NOTE: Cindi Carbon, MD FACP have personally reviewed patient's available data, including medical history, events of note, physical examination and test results as part of my evaluation. I have discussed with resident/NP and other care providers such as pharmacist, RN and RRT. In addition, I personally evaluated patient and elicited key findings of: roncghi severe, no  edema , warm ext, MODS, concern is PNA, Septic shock, MODS from infection, need to r/o cardiogenic component, rare to have flu, but is present at low levels, test for viral, sepsis protocol, limited echo, noted LV under filled , need to assess for veg, tox screen, i updated family x 3, augmentin allergy may not be significant or true, will administer IIMI, levo, vanc, give volume further, place neck line cvp, requires ALIne, high MV, repeat abg / la, may need swan if volume status is uncertain, at risk anoxia post arrest, CT abdo/pelvid, head if able to transfer , too unstable as of now, heparin in place coumadin , get amy, lip, ldh, await improved ph, then ards protocol, await cvp confirmation less 12 The patient is critically ill with multiple organ systems failure and requires high complexity decision making for assessment and support, frequent evaluation and titration of therapies, application of advanced monitoring technologies and extensive interpretation of multiple databases.    Critical Care Time devoted to patient care services described in this note is85 Minutes. This time reflects time of care of this signee: Rory Percy, MD FACP. This critical care time does not reflect procedure time, or teaching time or supervisory time of PA/NP/Med student/Med Resident etc but could involve care discussion time. Rest per NP/medical resident whose note is outlined above and that I agree with   Mcarthur Rossetti. Tyson Alias, MD, FACP Pgr: [phone] [facility] 07/21/2014 2:40 PM

## 2014-07-21 NOTE — Code Documentation (Signed)
Pulse check, Pulses regained. CPR stopped.

## 2014-07-21 NOTE — ED Provider Notes (Signed)
CSN: [id number]     Arrival date & time 07/21/14  1047 History   First MD Initiated Contact with Patient 07/21/14 1048     Chief Complaint  Patient presents with  . Weakness  . Diarrhea  . Altered Mental Status     (Consider location/radiation/quality/duration/timing/severity/associated sxs/prior Treatment) HPI Patient is a 69 year old female past medical history of hyperlipidemia, hypertension, status post mitral valve replacement 04/1998, on Coumadin who presents the ER brought by EMS for hypertension and altered mental status. Patient's family reports patient has been "feeling ill for the past several days, not eating well, having subjective diarrhea. He reports today they noticed she was "not acting right". Patient's daughter is in the room with her, reports that patient is poorly compliant with her medications, sometimes taking multiple doses, sometimes missing medications. During my interview, patient is fully alert, answering yes or no questions, however cannot drive any specific information. Patient complains of some abdominal pain, and "feeling sick".  Past Medical History  Diagnosis Date  . Hypertension   . Hyperlipidemia   . S/P MVR (mitral valve replacement) 04/1998    #29 St. Jude  . Valvular endocarditis    Past Surgical History  Procedure Laterality Date  . Mitral valve replacement  04/11/1998    St. Jude mechanical MVR (severe MR, episode of streptococcal bacterial endocarditis - Dr. Cornelius Moras  . Tubal ligation    . Transthoracic echocardiogram  11/2009    EF=>55%; bi-leaflet St. Jude mech prosthesis; mild TR, RVSP elevated at 40-70mmHg, mod pulm HTN; trace AV regurg; mild pulm valve regurg   Family History  Problem Relation Age of Onset  . Lung cancer Mother   . Heart failure Maternal Grandmother   . Heart failure Maternal Grandfather    History  Substance Use Topics  . Smoking status: Former Smoker    Quit date: 01/17/1973  . Smokeless tobacco: Never Used  . Alcohol  Use: No   OB History    No data available     Review of Systems  Unable to perform ROS: Acuity of condition      Allergies  Augmentin  Home Medications   Prior to Admission medications   Medication Sig Start Date End Date Taking? Authorizing Provider  carvedilol (COREG) 6.25 MG tablet Take 1 tablet (6.25 mg total) by mouth 2 (two) times daily with a meal. 03/11/14  Yes Chrystie Nose, MD  lisinopril (PRINIVIL,ZESTRIL) 20 MG tablet Take 1 tablet (20 mg total) by mouth daily. 03/11/14  Yes Chrystie Nose, MD  simvastatin (ZOCOR) 40 MG tablet Take 1 tablet (40 mg total) by mouth daily. 03/11/14  Yes Chrystie Nose, MD  triamterene-hydrochlorothiazide (DYAZIDE) 37.5-25 MG per capsule Take 1 each (1 capsule total) by mouth daily. 03/11/14  Yes Chrystie Nose, MD  warfarin (COUMADIN) 3 MG tablet TAKE ONE TO ONE & ONE-HALF TABLETS BY MOUTH ONCE DAILY AS DIRECTED 12/24/13   Chrystie Nose, MD   BP 67/43 mmHg  Pulse 64  Temp(Src) 97.8 F (36.6 C)  Resp 35  Ht 5\' 6"  (1.676 m)  Wt 162 lb 4.1 oz (73.6 kg)  BMI 26.20 kg/m2  SpO2 97% Physical Exam  Constitutional: She is oriented to person, place, and time. She has a sickly appearance. She appears distressed.  HENT:  Head: Normocephalic and atraumatic.  Mouth/Throat: Oropharynx is clear and moist. No oropharyngeal exudate.  Eyes: Right eye exhibits no discharge. Left eye exhibits no discharge. No scleral icterus.  Neck: Normal range of  motion.  Cardiovascular: Regular rhythm, S1 normal, S2 normal and normal heart sounds.  Bradycardia present.   No murmur heard. Pulmonary/Chest: Breath sounds normal. No accessory muscle usage. Tachypnea noted. She is in respiratory distress.  Tachypnea in the high 20s, low 30s, mild respiratory distress.  Abdominal: Soft. There is no tenderness.  Musculoskeletal: Normal range of motion. She exhibits no edema or tenderness.  Neurological: She is alert and oriented to person, place, and time. No cranial  nerve deficit. Coordination normal. GCS eye subscore is 4. GCS verbal subscore is 5. GCS motor subscore is 6.  Patient somewhat uncooperative with neurologic exam. Patient very agitated, will respond to verbal commands, and obey commands well.    Skin: Skin is warm and dry. No rash noted. She is not diaphoretic.  Psychiatric: She has a normal mood and affect.  Nursing note and vitals reviewed.   ED Course  Procedures (including critical care time) Labs Review Labs Reviewed  COMPREHENSIVE METABOLIC PANEL - Abnormal; Notable for the following:    Chloride 112 (*)    CO2 11 (*)    Glucose, Bld 179 (*)    BUN 57 (*)    Creatinine, Ser 3.57 (*)    Calcium 10.8 (*)    Albumin 3.0 (*)    AST 72 (*)    Total Bilirubin 2.4 (*)    GFR calc non Af Amer 12 (*)    GFR calc Af Amer 14 (*)    Anion gap 16 (*)    All other components within normal limits  PROTIME-INR - Abnormal; Notable for the following:    Prothrombin Time 27.1 (*)    INR 2.55 (*)    All other components within normal limits  CBG MONITORING, ED - Abnormal; Notable for the following:    Glucose-Capillary 154 (*)    All other components within normal limits  I-STAT CG4 LACTIC ACID, ED - Abnormal; Notable for the following:    Lactic Acid, Venous 7.77 (*)    All other components within normal limits  I-STAT TROPOININ, ED - Abnormal; Notable for the following:    Troponin i, poc 0.95 (*)    All other components within normal limits  I-STAT ARTERIAL BLOOD GAS, ED - Abnormal; Notable for the following:    pH, Arterial 7.024 (*)    pO2, Arterial 346.0 (*)    Bicarbonate 10.4 (*)    Acid-base deficit 20.0 (*)    All other components within normal limits  I-STAT ARTERIAL BLOOD GAS, ED - Abnormal; Notable for the following:    pCO2 arterial 25.9 (*)    pO2, Arterial 294.0 (*)    Bicarbonate 15.6 (*)    Acid-base deficit 8.0 (*)    All other components within normal limits  CULTURE, BLOOD (ROUTINE X 2)  CULTURE, BLOOD  (ROUTINE X 2)  URINE CULTURE  CULTURE, RESPIRATORY (NON-EXPECTORATED)  RESPIRATORY VIRUS PANEL  CLOSTRIDIUM DIFFICILE BY PCR (NOT AT [facility]  RESPIRATORY VIRUS PANEL  CBC  BLOOD GAS, ARTERIAL  OCCULT BLOOD X 1 CARD TO LAB, STOOL  SALICYLATE LEVEL  ACETAMINOPHEN LEVEL  URINALYSIS, ROUTINE W REFLEX MICROSCOPIC (NOT AT [facility])  LACTIC ACID, PLASMA  BRAIN NATRIURETIC PEPTIDE  CORTISOL  PROCALCITONIN  TROPONIN I  TROPONIN I  TROPONIN I  MAGNESIUM  PHOSPHORUS  BASIC METABOLIC PANEL  BASIC METABOLIC PANEL  BASIC METABOLIC PANEL  STREP PNEUMONIAE URINARY ANTIGEN  LEGIONELLA ANTIGEN, URINE  HEPARIN LEVEL (UNFRACTIONATED)  POC OCCULT BLOOD, ED    Imaging Review Dg  Chest Portable 1 View  07/21/2014   CLINICAL DATA:  Cardiac arrest.  Hypoxia.  EXAM: PORTABLE CHEST - 1 VIEW  COMPARISON:  January 13, 2007  FINDINGS: The endotracheal tube tip is at the level of T1, approximately 11 cm above the carina. Nasogastric tube tip is in the stomach with the side port at the gastroesophageal junction. No pneumothorax. There is interstitial and patchy alveolar edema throughout the lungs bilaterally, slightly more on the right than on the left. Heart is enlarged with pulmonary venous hypertension. Patient is status post mitral valve replacement.  IMPRESSION: Tube positions as described without pneumothorax. Note that both the endotracheal tube and nasogastric tube may warrant advancing. There is evidence of underlying congestive heart failure.   Electronically Signed   By: Bretta Bang III M.D.   On: 07/21/2014 13:56   Dg Chest Port 1v Same Day  07/21/2014   CLINICAL DATA:  Central line placement  EXAM: PORTABLE CHEST - 1 VIEW SAME DAY  COMPARISON:  07/21/2014  FINDINGS: Cardiomediastinal silhouette is stable. Persistent interstitial prominence bilateral consistent with mild pulmonary edema with slight improvement in aeration. Endotracheal tube again noted with tip at the level of T1 vertebral body. NG tube  in place. There is left IJ central line with tip in SVC. No pneumothorax.  IMPRESSION: Persistent mild interstitial prominence bilateral consistent with pulmonary edema with slight improvement in aeration. Again noted endotracheal tube with tip at T1 level. Stable NG tube position. Left IJ central line with tip in SVC. No pneumothorax.   Electronically Signed   By: Natasha Mead M.D.   On: 07/21/2014 15:26     EKG Interpretation None       CRITICAL CARE Performed by: Monte Fantasia   Total critical care time: 65 min  Critical care time was exclusive of separately billable procedures and treating other patients.  Critical care was necessary to treat or prevent imminent or life-threatening deterioration.  Critical care was time spent personally by me on the following activities: development of treatment plan with patient and/or surrogate as well as nursing, discussions with consultants, evaluation of patient's response to treatment, examination of patient, obtaining history from patient or surrogate, ordering and performing treatments and interventions, ordering and review of laboratory studies, ordering and review of radiographic studies, pulse oximetry and re-evaluation of patient's condition.   MDM   Final diagnoses:  Shock  Metabolic acidosis  Acute respiratory failure, unspecified whether with hypoxia or hypercapnia    Pt here initially with generalized weakness and hypotension.    Pt given 4 L fluids with no improvement of BP.  Patient is shock of unknown origin etiology. Leukocytosis or anemia. Fecal occult blood was negative. Persistent hypotension despite aggressive fluid therapy, and. Empiric antibiotics started due to lactic acid of 7, likely sepsis. Pt went into cardiac arrest, persisted into PEA. 2 rounds of CPR performed, patient intubated, epinephrine 2 with sodium bicarbonate given. ROSC achieved. Central line placed in the right femoral region. Please see Dr. Marylen Ponto  note in regard to this. Patient placed on pressors, critical care came to evaluate and admit patient. Cardiology consulted in regard to elevate troponin, and inability to r/o cardiogenic shock.  Pt admitted to ICU.    BP 67/43 mmHg  Pulse 64  Temp(Src) 97.8 F (36.6 C)  Resp 35  Ht  (1.676 m)  Wt 162 lb 4.1 oz (73.6 kg)  BMI 26.20 kg/m2  SpO2 97%  Signed,  Ladona Mow, PA-C 4:24 PM  Patient seen and discussed with Dr. Raeford Razor, MD    Ladona Mow, PA-C 07/21/14 1624  Raeford Razor, MD 07/22/14 1444

## 2014-07-22 ENCOUNTER — Inpatient Hospital Stay (HOSPITAL_COMMUNITY): Payer: Medicare Other

## 2014-07-22 ENCOUNTER — Encounter (HOSPITAL_COMMUNITY)
Admission: EM | Disposition: A | Discharge status: Rehabilitation Facility | Payer: PRIVATE HEALTH INSURANCE | Source: Home / Self Care | Attending: Thoracic Surgery (Cardiothoracic Vascular Surgery)

## 2014-07-22 ENCOUNTER — Telehealth: Payer: Self-pay | Admitting: Internal Medicine

## 2014-07-22 DIAGNOSIS — T8209XA Other mechanical complication of heart valve prosthesis, initial encounter: Secondary | ICD-10-CM

## 2014-07-22 DIAGNOSIS — R7989 Other specified abnormal findings of blood chemistry: Secondary | ICD-10-CM

## 2014-07-22 DIAGNOSIS — I340 Nonrheumatic mitral (valve) insufficiency: Secondary | ICD-10-CM

## 2014-07-22 DIAGNOSIS — I342 Nonrheumatic mitral (valve) stenosis: Secondary | ICD-10-CM

## 2014-07-22 HISTORY — DX: Other mechanical complication of heart valve prosthesis, initial encounter: T82.09XA

## 2014-07-22 HISTORY — PX: CARDIAC CATHETERIZATION: SHX172

## 2014-07-22 LAB — BASIC METABOLIC PANEL
ANION GAP: 12 (ref 5–15)
Anion gap: 13 (ref 5–15)
Anion gap: 10 (ref 5–15)
Anion gap: 10 (ref 5–15)
BUN: 61 mg/dL — ABNORMAL HIGH (ref 6–20)
BUN: 62 mg/dL — ABNORMAL HIGH (ref 6–20)
BUN: 62 mg/dL — ABNORMAL HIGH (ref 6–20)
BUN: 63 mg/dL — ABNORMAL HIGH (ref 6–20)
CALCIUM: 8.8 mg/dL — AB (ref 8.9–10.3)
CALCIUM: 9.1 mg/dL (ref 8.9–10.3)
CHLORIDE: 112 mmol/L — AB (ref 101–111)
CO2: 16 mmol/L — ABNORMAL LOW (ref 22–32)
CO2: 18 mmol/L — ABNORMAL LOW (ref 22–32)
CO2: 21 mmol/L — AB (ref 22–32)
CO2: 23 mmol/L (ref 22–32)
Calcium: 8.6 mg/dL — ABNORMAL LOW (ref 8.9–10.3)
Calcium: 8.8 mg/dL — ABNORMAL LOW (ref 8.9–10.3)
Chloride: 114 mmol/L — ABNORMAL HIGH (ref 101–111)
Chloride: 113 mmol/L — ABNORMAL HIGH (ref 101–111)
Chloride: 109 mmol/L (ref 101–111)
Creatinine, Ser: 3.65 mg/dL — ABNORMAL HIGH (ref 0.44–1.00)
Creatinine, Ser: 3.53 mg/dL — ABNORMAL HIGH (ref 0.44–1.00)
Creatinine, Ser: 3.62 mg/dL — ABNORMAL HIGH (ref 0.44–1.00)
Creatinine, Ser: 3.75 mg/dL — ABNORMAL HIGH (ref 0.44–1.00)
GFR calc Af Amer: 14 mL/min — ABNORMAL LOW (ref >60)
GFR calc Af Amer: 14 mL/min — ABNORMAL LOW (ref >60)
GFR calc non Af Amer: 12 mL/min — ABNORMAL LOW (ref >60)
GFR calc non Af Amer: 12 mL/min — ABNORMAL LOW (ref >60)
GFR calc non Af Amer: 12 mL/min — ABNORMAL LOW (ref >60)
GFR, EST AFRICAN AMERICAN: 14 mL/min — AB (ref >60)
GFR, EST AFRICAN AMERICAN: 13 mL/min — AB (ref >60)
GFR, EST NON AFRICAN AMERICAN: 12 mL/min — AB (ref >60)
GLUCOSE: 186 mg/dL — AB (ref 65–99)
GLUCOSE: 122 mg/dL — AB (ref 65–99)
Glucose, Bld: 176 mg/dL — ABNORMAL HIGH (ref 65–99)
Glucose, Bld: 127 mg/dL — ABNORMAL HIGH (ref 65–99)
POTASSIUM: 3.6 mmol/L (ref 3.5–5.1)
POTASSIUM: 3.1 mmol/L — AB (ref 3.5–5.1)
Potassium: 3.5 mmol/L (ref 3.5–5.1)
Potassium: 2.9 mmol/L — ABNORMAL LOW (ref 3.5–5.1)
Sodium: 141 mmol/L (ref 135–145)
Sodium: 142 mmol/L (ref 135–145)
Sodium: 144 mmol/L (ref 135–145)
Sodium: 144 mmol/L (ref 135–145)

## 2014-07-22 LAB — POCT I-STAT 3, ART BLOOD GAS (G3+)
ACID-BASE DEFICIT: 13 mmol/L — AB (ref 0.0–2.0)
ACID-BASE DEFICIT: 7 mmol/L — AB (ref 0.0–2.0)
Acid-base deficit: 1 mmol/L (ref 0.0–2.0)
BICARBONATE: 11.8 meq/L — AB (ref 20.0–24.0)
BICARBONATE: 15.3 meq/L — AB (ref 20.0–24.0)
Bicarbonate: 19.2 mEq/L — ABNORMAL LOW (ref 20.0–24.0)
O2 Saturation: 100 %
O2 Saturation: 96 %
O2 Saturation: 99 %
PCO2 ART: 23.7 mmHg — AB (ref 35.0–45.0)
PH ART: 7.305 — AB (ref 7.350–7.450)
Patient temperature: 97.6
TCO2: 12 mmol/L (ref 0–100)
TCO2: 16 mmol/L (ref 0–100)
TCO2: 20 mmol/L (ref 0–100)
pCO2 arterial: 19.6 mmHg — CL (ref 35.0–45.0)
pCO2 arterial: 21.8 mmHg — ABNORMAL LOW (ref 35.0–45.0)
pH, Arterial: 7.453 — ABNORMAL HIGH (ref 7.350–7.450)
pH, Arterial: 7.599 — ABNORMAL HIGH (ref 7.350–7.450)
pO2, Arterial: 137 mmHg — ABNORMAL HIGH (ref 80.0–100.0)
pO2, Arterial: 150 mmHg — ABNORMAL HIGH (ref 80.0–100.0)
pO2, Arterial: 84 mmHg (ref 80.0–100.0)

## 2014-07-22 LAB — RAPID URINE DRUG SCREEN, HOSP PERFORMED
Amphetamines: NOT DETECTED
Barbiturates: NOT DETECTED
Benzodiazepines: NOT DETECTED
Cocaine: NOT DETECTED
Opiates: NOT DETECTED
Tetrahydrocannabinol: NOT DETECTED

## 2014-07-22 LAB — GLUCOSE, CAPILLARY
GLUCOSE-CAPILLARY: 106 mg/dL — AB (ref 65–99)
Glucose-Capillary: 130 mg/dL — ABNORMAL HIGH (ref 65–99)
Glucose-Capillary: 176 mg/dL — ABNORMAL HIGH (ref 65–99)
Glucose-Capillary: 202 mg/dL — ABNORMAL HIGH (ref 65–99)
Glucose-Capillary: 114 mg/dL — ABNORMAL HIGH (ref 65–99)
Glucose-Capillary: 113 mg/dL — ABNORMAL HIGH (ref 65–99)

## 2014-07-22 LAB — HEPARIN LEVEL (UNFRACTIONATED)
Heparin Unfractionated: 0.27 IU/mL — ABNORMAL LOW (ref 0.30–0.70)
Heparin Unfractionated: 0.49 IU/mL (ref 0.30–0.70)

## 2014-07-22 LAB — PROTIME-INR
INR: 4.29 — ABNORMAL HIGH (ref 0.00–1.49)
Prothrombin Time: 40 seconds — ABNORMAL HIGH (ref 11.6–15.2)

## 2014-07-22 LAB — MAGNESIUM: Magnesium: 1.1 mg/dL — ABNORMAL LOW (ref 1.7–2.4)

## 2014-07-22 LAB — LACTIC ACID, PLASMA
LACTIC ACID, VENOUS: 3.5 mmol/L — AB (ref 0.5–2.0)
Lactic Acid, Venous: 6.1 mmol/L (ref 0.5–2.0)
Lactic Acid, Venous: 2.9 mmol/L (ref 0.5–2.0)

## 2014-07-22 LAB — CBC
HCT: 37.8 % (ref 36.0–46.0)
Hemoglobin: 13.4 g/dL (ref 12.0–15.0)
MCH: 29.1 pg (ref 26.0–34.0)
MCHC: 35.4 g/dL (ref 30.0–36.0)
MCV: 82.2 fL (ref 78.0–100.0)
Platelets: 135 10*3/uL — ABNORMAL LOW (ref 150–400)
RBC: 4.6 MIL/uL (ref 3.87–5.11)
RDW: 12.8 % (ref 11.5–15.5)
WBC: 12 10*3/uL — ABNORMAL HIGH (ref 4.0–10.5)

## 2014-07-22 LAB — PROCALCITONIN: PROCALCITONIN: 2.9 ng/mL

## 2014-07-22 LAB — MRSA PCR SCREENING: MRSA by PCR: NEGATIVE

## 2014-07-22 LAB — TROPONIN I: Troponin I: 1.55 ng/mL (ref <0.031)

## 2014-07-22 LAB — PHOSPHORUS: PHOSPHORUS: 1.4 mg/dL — AB (ref 2.5–4.6)

## 2014-07-22 LAB — CLOSTRIDIUM DIFFICILE BY PCR: CDIFFPCR: NEGATIVE

## 2014-07-22 SURGERY — FLUOROSCOPY GUIDANCE
Anesthesia: LOCAL

## 2014-07-22 MED ORDER — LEVOFLOXACIN IN D5W 500 MG/100ML IV SOLN: 500.0000 mg | INTRAVENOUS | Status: DC

## 2014-07-22 MED ORDER — LEVOFLOXACIN IN D5W 750 MG/150ML IV SOLN
750.0000 mg | INTRAVENOUS | Status: AC
Start: 2014-07-22 — End: 2014-07-22
  Administered 2014-07-22: 750 mg via INTRAVENOUS
  Filled 2014-07-22: qty 150

## 2014-07-22 MED ORDER — POTASSIUM CHLORIDE 10 MEQ/50ML IV SOLN
10.0000 meq | INTRAVENOUS | Status: AC
  Administered 2014-07-22 (×4): 10 meq via INTRAVENOUS
  Filled 2014-07-22 (×4): qty 50

## 2014-07-22 MED ORDER — VITAMIN K1 10 MG/ML IJ SOLN
2.0000 mg | Freq: Once | INTRAMUSCULAR | Status: AC
  Administered 2014-07-22: 2 mg via INTRAVENOUS
  Filled 2014-07-22: qty 0.2

## 2014-07-22 MED ORDER — SODIUM CHLORIDE 0.9 % IV SOLN: Freq: Once | INTRAVENOUS | Status: DC

## 2014-07-22 MED ORDER — FAMOTIDINE IN NACL 20-0.9 MG/50ML-% IV SOLN
20.0000 mg | Freq: Every day | INTRAVENOUS | Status: DC
  Administered 2014-07-23: 20 mg via INTRAVENOUS
  Filled 2014-07-22: qty 50

## 2014-07-22 MED ORDER — SODIUM CHLORIDE 0.9 % IV SOLN
Freq: Once | INTRAVENOUS | Status: AC
  Administered 2014-07-22: 20:00:00 via INTRAVENOUS

## 2014-07-22 MED FILL — Medication: Qty: 1 | Status: AC

## 2014-07-22 SURGICAL SUPPLY — 1 items: HOVERMATT SINGLE USE (MISCELLANEOUS) ×3 IMPLANT

## 2014-07-22 NOTE — Progress Notes (Signed)
Critical value from ABG pCO2 19 given to [place], [place], [place]. Orders given to decreased RR to 20

## 2014-07-22 NOTE — Progress Notes (Signed)
eLink Physician-Brief Progress Note Patient Name: [name] DOB: 1945-12-30 MRN: [id number]   Date of Service  07/22/2014  HPI/Events of Note  Coagulopathic d/t Warfarin. CVTS requests reversal of anticoagulation. INR = 4.29.   eICU Interventions  Will order: 1. Vitamin K 2 mg IV now.  2. FFP 4 units IV. 3. Repeat PT/INR at 11:30 PM.  Plan - Start Heparin IV when PT/INR < 3.0     Intervention Category Intermediate Interventions: Coagulopathy - evaluation and management  Sommer,Steven Eugene 07/22/2014, 7:21 PM

## 2014-07-22 NOTE — CV Procedure (Signed)
    TEE  Mechanical mitral valve dysfunction  Findings:  Abnormal mechanical mitral valve -Fixed posterior mechanical leaflet of St. Jude valve in closed position with pannus overgrowth.  -Severe mitral stenosis (29-9mmHg mean gradient inflow) -mild central mitral regurgitation  ? Possible PA thrombus vs. Artifact (INR 2.5 on admit)  Normal EF.  Discussed with MICU Attending and Dr. Rennis Golden.  Corresponding with Dr. Clifton James.   Will consult TCTS.  Donato Schultz, MD

## 2014-07-22 NOTE — Progress Notes (Signed)
Echocardiogram Echocardiogram Transesophageal has been performed.  [name] 07/22/2014, 12:44 PM

## 2014-07-22 NOTE — Progress Notes (Addendum)
Patient Profile: 69 y/o female with a remote history of MV endocarditis requiring mechanical MVR in 2000. She was noted to have a new RBBB earlier this year. Has had malaise and mental status changes for the past few days and presented to the ER 07/21/14 with acute renal failure and severe acidosis. Shortly after, she became acutely diaphoretic and bradycardic down to 20-30 bpm and then had a PEA arrest in the ED. Now resuscitated and intubated.  Subjective: Alert but remains intubated. No distress. Family is by bedside and reports that she complained of abdominal pain the days prior to admission. No reports of chest pain, fever or chills.   Objective: Vital signs in last 24 hours: Temp:  [97.5 F (36.4 C)-99.6 F (37.6 C)] 97.5 F (36.4 C) (07/15 0405) Pulse Rate:  [29-84] 72 (07/15 0700) Resp:  [0-35] 20 (07/15 0700) BP: (48-129)/(17-92) 94/71 mmHg (07/15 0700) SpO2:  [86 %-100 %] 100 % (07/15 0700) Arterial Line BP: (88-331)/(53-327) 104/64 mmHg (07/15 0700) FiO2 (%):  [40 %-100 %] 40 % (07/15 0808) Weight:  [162 lb 4.1 oz (73.6 kg)] 162 lb 4.1 oz (73.6 kg) (07/14 1600) Last BM Date:  (PTA)  Intake/Output from previous day: 07/14 0701 - 07/15 0700 In: 2948.5 [I.V.:1798.5; IV Piggyback:1150] Out: 295 [Urine:295] Intake/Output this shift:    Medications Current Facility-Administered Medications  Medication Dose Route Frequency Provider Last Rate Last Dose  . 0.9 %  sodium chloride infusion  250 mL Intravenous PRN Duayne Cal, NP      . antiseptic oral rinse (CPC / CETYLPYRIDINIUM CHLORIDE 0.05%) solution 7 mL  7 mL Mouth Rinse QID Nelda Bucks, MD   7 mL at 07/22/14 0402  . chlorhexidine (PERIDEX) 0.12 % solution 15 mL  15 mL Mouth Rinse BID Nelda Bucks, MD   15 mL at 07/22/14 0815  . EPINEPHrine (ADRENALIN) 4 mg in dextrose 5 % 250 mL (0.016 mg/mL) infusion  0.5-20 mcg/min Intravenous Titrated Raeford Razor, MD   Stopped at 07/21/14 1747  . famotidine  (PEPCID) IVPB 20 mg premix  20 mg Intravenous Q12H Duayne Cal, NP   20 mg at 07/21/14 1709  . fentaNYL (SUBLIMAZE) injection 50 mcg  50 mcg Intravenous Q15 min PRN Duayne Cal, NP      . fentaNYL (SUBLIMAZE) injection 50 mcg  50 mcg Intravenous Q2H PRN Duayne Cal, NP   50 mcg at 07/22/14 0624  . heparin ADULT infusion 100 units/mL (25000 units/250 mL)  1,100 Units/hr Intravenous Continuous Juliette Mangle, RPH 11 mL/hr at 07/22/14 0244 1,100 Units/hr at 07/22/14 0244  . hydrocortisone sodium succinate (SOLU-CORTEF) 100 MG injection 50 mg  50 mg Intravenous Q6H Duayne Cal, NP   50 mg at 07/22/14 0404  . imipenem-cilastatin (PRIMAXIN) 250 mg in sodium chloride 0.9 % 100 mL IVPB  250 mg Intravenous Q12H Para March Batchelder, RPH   250 mg at 07/22/14 0400  . insulin aspart (novoLOG) injection 2-6 Units  2-6 Units Subcutaneous 6 times per day Duayne Cal, NP   4 Units at 07/22/14 [phone]  . [START ON 07/24/2014] levofloxacin (LEVAQUIN) IVPB 500 mg  500 mg Intravenous Q48H Titus Mould, RPH      . norepinephrine (LEVOPHED) 16 mg in dextrose 5 % 250 mL (0.064 mg/mL) infusion  0-40 mcg/min Intravenous Titrated Nelda Bucks, MD 4.7 mL/hr at 07/21/14 2300 5 mcg/min at 07/21/14 2300  . sodium bicarbonate 150 mEq in sterile water 1,000 mL  infusion   Intravenous Continuous Duayne Cal, NP 75 mL/hr at 07/22/14 0640    . vasopressin (PITRESSIN) 40 Units in sodium chloride 0.9 % 250 mL (0.16 Units/mL) infusion  0.03 Units/min Intravenous Continuous Duayne Cal, NP 11.3 mL/hr at 07/21/14 1900 0.03 Units/min at 07/21/14 1900    PE: General appearance: alert, cooperative, no distress and intubated Neck: no carotid bruit and no JVD Lungs: intubated Heart: regular rate and rhythm, S1, S2 normal, no murmur, click, rub or gallop Extremities: no LEE Pulses: 2+ and symmetric Skin: warm and dry Neurologic: Grossly normal  Lab Results:   Recent Labs  07/21/14 1207 07/22/14 0400  WBC 8.2  12.0*  HGB 14.4 13.4  HCT 43.6 37.8  PLT 159 135*   BMET  Recent Labs  07/21/14 2036 07/22/14 0130 07/22/14 0400  NA 142 141 142  K 4.1 3.6 3.5  CL 113* 112* 114*  CO2 13* 16* 18*  GLUCOSE 214* 176* 186*  BUN 58* 61* 62*  CREATININE 3.32* 3.65* 3.53*  CALCIUM 8.0* 8.6* 8.8*   PT/INR  Recent Labs  07/21/14 1207  LABPROT 27.1*  INR 2.55*   Cardiac Panel (last 3 results)  Recent Labs  07/21/14 1436 07/21/14 2036 07/22/14 0130  TROPONINI 1.33* 1.28* 1.55*    Studies/Results: 2D echo 03/21/14 Study Conclusions  - Left ventricle: The cavity size was normal. Wall thickness was normal. Systolic function was normal. The estimated ejection fraction was in the range of 55% to 60%. - Aortic valve: There was mild regurgitation. - Mitral valve: Mechanical MVR. Disc motin not seen and appears reduced compared to echo done in 2015 along with elevation in diastolic gradients suggests possibility of disc malfunction. Shadowing in LA may obscure MR. Suggest TEE or fluoro to further assess valve. - Left atrium: The atrium was mildly dilated. - Right ventricle: The cavity size was mildly dilated. - Right atrium: The atrium was mildly dilated. - Tricuspid valve: Marked elevation in PA pressure since echo done 2015 may be related to MVR dysfunction In setting of arrest may need to r/o PE. There was severe regurgitation. - Pulmonary arteries: PA peak pressure: 80 mm Hg (S).    Assessment/Plan  Active Problems:   S/P MVR (mitral valve replacement)   HTN (hypertension)   RBBB   Shock   Septic shock   Cardiac arrest   Encounter for central line placement   1. Septic Shock: management per critical care  2. Cardiac Arrest: s/p resuscitation and intubation. In the setting of septic shock. Management per critical care.   3. Mechanical Mitral Valve: implanted in 2000 for MV endocarditis. 2D echo 07/21/14 with poor image quality that does not clearly outline  valve leaflets. Disc motion not seen and appears reduced compared to echo done in 2015 along with elevation in diastolic gradients. We can consider TEE or fluoro to further assess. LV systolic function normal on echo. EF 55-60%.  4. Elevated PA Pressures: 2D echo revealed marked elevation in PA pressure since echo done in 2015. This  may be related to MVR dysfunction, but in setting of an arrest may need to r/o PE.   5. Elevated troponin: Likely demand ischemia in setting of profound hypotension with shock.     LOS: 1 day    Brittainy M. Delmer Islam 07/22/2014 8:22 AM   I have personally seen and examined this patient with Robbie Lis, PA-C. I agree with the assessment and plan as outlined above. Pt admitted in shock, likely  septic, with hypotension, renal failure, AMS. She is intubated but awake. LV function normal on echo. She is s/p MVR in 2000. Echo yesterday did not clearly outline the mechanical valve discs. There is concern that her presentation could be related to valve dysfunction/valve thrombosis. I have reviewed the surface echo with our imaging team. We would recommend TEE today at bedside. I have also placed her on the schedule to come to the cath lab for fluoro of the valve leaflets to better define disc movement. She is presently on therapy for possible sepsis but if there is an issue with the valve discs, she will need surgical consultation.    MCALHANY,CHRISTOPHER 07/22/2014 9:15 AM

## 2014-07-22 NOTE — Progress Notes (Signed)
PULMONARY / CRITICAL CARE MEDICINE   Name: [name] MRN: [id number] DOB: 11-20-1945    ADMISSION DATE:  08-02-14 CONSULTATION DATE:  08-02-22  REFERRING MD :  Bonney Roussel EDP  CHIEF COMPLAINT:  Shock  INITIAL PRESENTATION: 69 year old female presented to ED 08-02-22 with AMS. In ED was profoundly hypotensive with elevated lactic, and unfortunately suffered 2 PEA arrests totaling approximately 5 minutes in duration. Post arrest she is in refractory shock despite 4L IVF resuscitation and 2 vasoactive agents. PCCM to admit.  STUDIES:  Echo 2022-08-02 > EF 55-60%. Mild aortic regurg. Mechanical MVR. Questionable disc malfunction. RV mildly dilated. PA peak pressure .  SIGNIFICANT EVENTS: 2022-08-02 coded in ED, PEA arrest 6 mins. PCCM admit to ICU intubated August 02, 2022 Left IJ CVC Placed  SUBJECTIVE:  No acute events overnight. Patient nods no to any pain or difficulty breathing.  VITAL SIGNS: Temp:  [97.5 F (36.4 C)-99.6 F (37.6 C)] 97.7 F (36.5 C) (07/15 0800) Pulse Rate:  [29-84] 73 (07/15 1300) Resp:  [0-30] 21 (07/15 1300) BP: (72-120)/(46-90) 91/65 mmHg (07/15 1300) SpO2:  [86 %-100 %] 100 % (07/15 1300) Arterial Line BP: (72-331)/(33-327) 112/60 mmHg (07/15 1300) FiO2 (%):  [40 %] 40 % (07/15 1200) Weight:  [73.6 kg (162 lb 4.1 oz)] 73.6 kg (162 lb 4.1 oz) 08-02-2022 1600) HEMODYNAMICS: CVP:  [6 mmHg-17 mmHg] 7 mmHg VENTILATOR SETTINGS: Vent Mode:  [-] PRVC FiO2 (%):  [40 %] 40 % Set Rate:  [20 bmp-28 bmp] 20 bmp Vt Set:  [480 mL] 480 mL PEEP:  [5 cmH20] 5 cmH20 Plateau Pressure:  [21 cmH20-25 cmH20] 21 cmH20 INTAKE / OUTPUT:  Intake/Output Summary (Last 24 hours) at 07/22/14 1504 Last data filed at 07/22/14 1300  Gross per 24 hour  Intake 3633.08 ml  Output    385 ml  Net 3248.08 ml    PHYSICAL EXAMINATION: General:  No distress. Patient remains on ventilator but is awake. Neuro:  Moving all 4 extremities equally. Following commands. Nodding to questions. HEENT:   Endotracheal tube in place. No scleral icterus or injection. Cardiovascular:  Regular rate. No JVD. Regular rhythm. Lungs:  Good aeration bilaterally on vent. Normal work of breathing. Abdomen:  Nondistended. Nontender. Soft. Musculoskeletal:  Normal bulk and tone. No joint deformity or effusion. Skin:  Warm & dry. No rash on exposed skin.  LABS:  CBC  Recent Labs Lab 2014/08/02 1207 07/22/14 0400  WBC 8.2 12.0*  HGB 14.4 13.4  HCT 43.6 37.8  PLT 159 135*   Coag's  Recent Labs Lab 08-02-2014 1207 07/22/14 0800  INR 2.55* 4.29*   BMET  Recent Labs Lab 07/22/14 0130 07/22/14 0400 07/22/14 0940  NA 141 142 144  K 3.6 3.5 3.1*  CL 112* 114* 113*  CO2 16* 18* 21*  BUN 61* 62* 62*  CREATININE 3.65* 3.53* 3.62*  GLUCOSE 176* 186* 127*   Electrolytes  Recent Labs Lab 08-02-14 1436  07/22/14 0130 07/22/14 0400 07/22/14 0940  CALCIUM  --   < > 8.6* 8.8* 8.8*  MG 1.3*  --   --  1.1*  --   PHOS 5.9*  --   --  1.4*  --   < > = values in this interval not displayed. Sepsis Markers  Recent Labs Lab 08-02-2014 1436  2014/08/02 2059 07/22/14 0400 07/22/14 0430 07/22/14 0701  LATICACIDVEN  --   < > 6.1*  --  3.5* 2.9*  PROCALCITON 0.31  --   --  2.90  --   --   < > =  values in this interval not displayed. ABG  Recent Labs Lab 07/21/14 1800 07/22/14 0002 07/22/14 0503  PHART 7.305* 7.453* 7.599*  PCO2ART 23.7* 21.8* 19.6*  PO2ART 84.0 137.0* 150.0*   Liver Enzymes  Recent Labs Lab 07/21/14 1207  AST 72*  ALT 49  ALKPHOS 56  BILITOT 2.4*  ALBUMIN 3.0*   Cardiac Enzymes  Recent Labs Lab 07/21/14 1436 07/21/14 2036 07/22/14 0130  TROPONINI 1.33* 1.28* 1.55*   Glucose  Recent Labs Lab 07/21/14 1654 07/21/14 1956 07/22/14 0011 07/22/14 0358 07/22/14 0845 07/22/14 1324  GLUCAP 156* 202* 130* 176* 114* 113*    Imaging Dg Chest Port 1 View  07/22/2014   CLINICAL DATA:  Acute respiratory failure. On ventilator. Septic shock. Cardiac arrest.   EXAM: PORTABLE CHEST - 1 VIEW  COMPARISON:  07/21/2014  FINDINGS: Support lines and tubes in appropriate position. Symmetric bilateral mid and lower lung airspace disease and small to moderate bilateral pleural effusion show no significant change. Heart size is stable.  IMPRESSION: Symmetric airspace disease and bilateral pleural effusions, without significant change.   Electronically Signed   By: Myles Rosenthal M.D.   On: 07/22/2014 07:12   Dg Chest Port 1 View  07/21/2014   CLINICAL DATA:  Respiratory failure  EXAM: PORTABLE CHEST - 1 VIEW  COMPARISON:  07/21/2014  FINDINGS: Cardiac shadow is stable. Postsurgical changes are again seen. An endotracheal tube is again identified 3.5 cm above the carina. A left jugular central line is noted in satisfactory position as is a nasogastric catheter. Bilateral pleural effusions are seen increased from the prior exam. Bibasilar infiltrative changes are noted as well. Stable changes in the left apex are seen.  IMPRESSION: Stable patchy opacities are noted within the bases as well as the left upper lobe. Increasing bilateral pleural effusions are seen.  Tubes and lines as described.   Electronically Signed   By: Alcide Clever M.D.   On: 07/21/2014 17:22   Dg Chest Port 1v Same Day  07/21/2014   CLINICAL DATA:  Central line placement  EXAM: PORTABLE CHEST - 1 VIEW SAME DAY  COMPARISON:  07/21/2014  FINDINGS: Cardiomediastinal silhouette is stable. Persistent interstitial prominence bilateral consistent with mild pulmonary edema with slight improvement in aeration. Endotracheal tube again noted with tip at the level of T1 vertebral body. NG tube in place. There is left IJ central line with tip in SVC. No pneumothorax.  IMPRESSION: Persistent mild interstitial prominence bilateral consistent with pulmonary edema with slight improvement in aeration. Again noted endotracheal tube with tip at T1 level. Stable NG tube position. Left IJ central line with tip in SVC. No  pneumothorax.   Electronically Signed   By: Natasha Mead M.D.   On: 07/21/2014 15:26     ASSESSMENT / PLAN:  PULMONARY OETT 7/14 >>> A: Acute hypoxic respiratory failure - Question possible pneumonia  P:   Continue vent support Defer ARDS protocol  VAP bundle  CARDIOVASCULAR Left IJ CVC 7/14 >>> A:  Septic Shock Possible mitral valve dysfunction S/p MVR in 2000 - INR supratherapeutic on Coumadin H/O HTN H/O Dyslipidemia  P:  MAP goal > 65 mm/Hg IVF resuscitation CVP monitoring TEE pending for possible mitral valve malfunction Cardiology following  RENAL A:   Acute Renal Failure Lactic Acidosis - Improving Mild hypercalcemia Hypokalemia   P:   Trending UOP Trending Daily BUN/Creatinine Giving KCl IV Trending electrolytes Repeat Lactic Acid in AM 7/16  GASTROINTESTINAL A:   Acute Liver Injury  P:  NPO for now Trending liver function tests Trending coags (PT, PTT, & Fibrinogen) Pepcid IV daily   HEMATOLOGIC A:   Coagulopathy - Secondary to Coumadin  P:  Hold coumadin Follow coags Daily CBC Start heparin infusion per pharmacy protocol  INFECTIOUS A:   Septic shock - Unclear etiology.  P:   PCT algorithm  BCx2 7/14 >>> UC 7/14 >>> Sputum 7/14 >>> Flu 7/14 >>> RVP 7/14>>> Urine strep 7/14 >>> Urine legionella 7/14 >>> C-dif 7/14 >>>  Abx: Imipenem, start date 7/14 >>> Abx: Levofloxacin, start date 7/14 >>> Abx: Vancomycin, start date 7/14 >>>  ENDOCRINE A:   No acute issues.  P:   CBG monitoring and SSI  NEUROLOGIC A:   Acute encephalopathy, multifactorial metabolic, anoxic - Resolved   P:   RASS goal: -1 Fentanyl PRN for analgesia/sedation Monitor   FAMILY  - Updates: Sons & family updated at bedside today by me  - Inter-disciplinary family meet or Palliative Care meeting due by:  7/21  I have spent a total of 33 minutes of critical care time today updating the patient's family, reviewing the patient's  electronic medical record, and caring for the patient.  Donna Christen Jamison Neighbor, M.D. [facility] Pager:  [phone] After 3pm or if no response, call [phone]

## 2014-07-22 NOTE — Progress Notes (Signed)
ANTICOAGULATION CONSULT NOTE - Follow Up Consult  Pharmacy Consult for heparin Indication: MVR   Labs:  Recent Labs  07/21/14 1207 07/21/14 1436 07/21/14 1600 07/21/14 2036 07/22/14 0130  HGB 14.4  --   --   --   --   HCT 43.6  --   --   --   --   PLT 159  --   --   --   --   LABPROT 27.1*  --   --   --   --   INR 2.55*  --   --   --   --   HEPARINUNFRC  --   --   --   --  0.27*  CREATININE 3.57*  --  3.71* 3.32* 3.65*  TROPONINI  --  1.33*  --  1.28*  --       Assessment: 69yo female slightly subtherapeutic on heparin with initial dosing while Coumadin on hold.  Goal of Therapy:  Heparin level 0.3-0.7 units/ml   Plan:  Will increase heparin gtt slightly to 1100 units/hr and check level in 8hr.  Vernard Gambles, PharmD, BCPS  07/22/2014,2:37 AM

## 2014-07-22 NOTE — Progress Notes (Signed)
Initial Nutrition Assessment  DOCUMENTATION CODES:   Not applicable  INTERVENTION:    If unable to extubate patient within next 24 hours, recommend initiate TF via OGT with Vital AF 1.2 at 25 ml/h and Prostat 30 ml BID on day 1; on day 2, d/c Prostat and increase to goal rate of 55 ml/h (1320 ml per day) to provide 1584 kcals, 99 gm protein, 1071 ml free water daily.  NUTRITION DIAGNOSIS:   Inadequate oral intake related to inability to eat as evidenced by NPO status.  GOAL:   Patient will meet greater than or equal to 90% of their needs  MONITOR:   Labs, Weight trends, Vent status  REASON FOR ASSESSMENT:   Ventilator    ASSESSMENT:   69 year old female presented to ED 7/14 with AMS. In ED was profoundly hypotensive with elevated lactic, and unfortunately suffered 2 PEA arrests totaling approximately 5 minutes in duration.  Labs reviewed, potassium low.  Unable to complete Nutrition-Focused physical exam at this time.   Patient is currently intubated on ventilator support MV: 9.8 L/min Temp (24hrs), Avg:98 F (36.7 C), Min:97.5 F (36.4 C), Max:99.6 F (37.6 C)  Propofol: none   Diet Order:  Diet NPO time specified  Skin:  Reviewed, no issues  Last BM:  7/15  Height:   Ht Readings from Last 1 Encounters:  07/21/14 5\' 6"  (1.676 m)    Weight:   Wt Readings from Last 1 Encounters:  07/21/14 162 lb 4.1 oz (73.6 kg)    Ideal Body Weight:  59.1 kg  Wt Readings from Last 10 Encounters:  07/21/14 162 lb 4.1 oz (73.6 kg)  03/11/14 151 lb 8 oz (68.72 kg)  01/21/13 164 lb 1.6 oz (74.435 kg)    BMI:  Body mass index is 26.2 kg/(m^2).  Estimated Nutritional Needs:   Kcal:  1616  Protein:  95-100 gm  Fluid:  1.8 L  EDUCATION NEEDS:   No education needs identified at this time   Joaquin Courts, RD, LDN, CNSC Pager [phone] After Hours Pager [phone]

## 2014-07-22 NOTE — Consult Note (Addendum)
[facility] BRIEF CONSULT NOTE   Patient seen and examined, chart, ECHO's and flouroscopy reviewed.   Patient has presumably acute-onset mitral stenosis due to prosthetic valve dysfunction with one of the two leaflets of her bileaflet mechanical mitral valve being stuck in the closed position.  There is no definite clot present to suggest valve thrombosis, although this cannot be definitively ruled out.  I suspect that the dysfunction may be related to pannus ingrowth.  Although she does not have h/o CAD she will need diagnostic cardiac catheterization (with minimal contrast) prior to surgery.  Her anticoagulation needs to be reversed and heparin started once INR < 3.0.  I have discussed the nature of this problem with the patient (who is awake and alert on vent and answering questions appropriately) and with her daughter and son-in-law.  All questions answered.  Full consult note to follow.   I spent in excess of 60 minutes during the conduct of this initial hospital encounter and >50% of this time involved direct face-to-face encounter with the patient for counseling and/or coordination of their care.        Purcell Nails 07/22/2014 6:50 PM

## 2014-07-22 NOTE — Telephone Encounter (Signed)
Thanks .. I got an update from my partners.  Dr. Rexene Edison

## 2014-07-22 NOTE — Telephone Encounter (Signed)
Daughter wants Dr Rennis Golden to know pt was admitted to [facility] yesterday(07-21-14).

## 2014-07-22 NOTE — Progress Notes (Signed)
     Spoke with Dr. Cornelius Moras, Dr. Kirke Corin, Dr. Eldridge Dace, as well as Dr. Arsenio Loader (CCM). Plan is to aggressively reverse coagulopathy (INR 4.3), replete KCL (2.9) and urgently perform diagnostic cardiac catheterization (hopeful radial access) tomorrow morning.   FFP 4 units Vit K 2mg  IV Repeat INR at 11:30 pm  Start heparin when INR <3.   Donato Schultz, MD

## 2014-07-22 NOTE — CV Procedure (Signed)
[name] is a 69 y.o. female    [id number]  [id number] LOCATION:  FACILITY: [facility]  PHYSICIAN: Lennette Bihari, MD, [facility] 09/13/1945   DATE OF PROCEDURE:  07/22/2014    CARDIAC FLOUROSCOPY     Indication : Evaluate valve motion on a mechanical St. Jude mitral valve prosthesis  The patient was brought to the cardiac catheterization laboratory to further evaluate mechanical St. Jude prosthetic mitral valve motion.  Fluoroscopy was performed in multiple views.  The posterior leaflet was non-mobile and appeared fixed in the close position.  The anterior leaflet had motion but there was reduced disc valve excursion The findings would be suggestive of severe mitral stenosis.  Patient tolerated procedure well and was transported back to the medical intensive care unit.   Lennette Bihari, MD, [facility] 07/22/2014 4:19 PM

## 2014-07-22 NOTE — Progress Notes (Signed)
Utilization review completed. Sheccid Lahmann, RN, BSN. 

## 2014-07-22 NOTE — Telephone Encounter (Signed)
Left message on  daughter 's voice mail  message sent to Dr Rennis Golden Patient is being seen by cardiology sevrice service

## 2014-07-23 ENCOUNTER — Encounter (HOSPITAL_COMMUNITY): Payer: Self-pay | Admitting: Thoracic Surgery (Cardiothoracic Vascular Surgery)

## 2014-07-23 ENCOUNTER — Inpatient Hospital Stay (HOSPITAL_COMMUNITY): Payer: Medicare Other

## 2014-07-23 DIAGNOSIS — Z954 Presence of other heart-valve replacement: Secondary | ICD-10-CM

## 2014-07-23 DIAGNOSIS — N179 Acute kidney failure, unspecified: Secondary | ICD-10-CM

## 2014-07-23 LAB — CBC
HEMATOCRIT: 31.6 % — AB (ref 36.0–46.0)
Hemoglobin: 11 g/dL — ABNORMAL LOW (ref 12.0–15.0)
MCH: 29.2 pg (ref 26.0–34.0)
MCHC: 34.8 g/dL (ref 30.0–36.0)
MCV: 83.8 fL (ref 78.0–100.0)
Platelets: 143 10*3/uL — ABNORMAL LOW (ref 150–400)
RBC: 3.77 MIL/uL — AB (ref 3.87–5.11)
RDW: 13.1 % (ref 11.5–15.5)
WBC: 16.2 10*3/uL — AB (ref 4.0–10.5)

## 2014-07-23 LAB — SODIUM, URINE, RANDOM: Sodium, Ur: <10 mmol/L

## 2014-07-23 LAB — URINE MICROSCOPIC-ADD ON

## 2014-07-23 LAB — URINALYSIS, ROUTINE W REFLEX MICROSCOPIC
GLUCOSE, UA: NEGATIVE mg/dL
Ketones, ur: 15 mg/dL — AB
Nitrite: NEGATIVE
Protein, ur: 30 mg/dL — AB
Specific Gravity, Urine: 1.022 (ref 1.005–1.030)
Urobilinogen, UA: 1 mg/dL (ref 0.0–1.0)
pH: 5 (ref 5.0–8.0)

## 2014-07-23 LAB — CREATININE, URINE, RANDOM: CREATININE, URINE: 289.8 mg/dL

## 2014-07-23 LAB — PREPARE FRESH FROZEN PLASMA
UNIT DIVISION: 0
Unit division: 0
Unit division: 0
Unit division: 0

## 2014-07-23 LAB — RENAL FUNCTION PANEL
ANION GAP: 12 (ref 5–15)
Albumin: 2.8 g/dL — ABNORMAL LOW (ref 3.5–5.0)
BUN: 70 mg/dL — ABNORMAL HIGH (ref 6–20)
CHLORIDE: 109 mmol/L (ref 101–111)
CO2: 22 mmol/L (ref 22–32)
Calcium: 10 mg/dL (ref 8.9–10.3)
Creatinine, Ser: 3.94 mg/dL — ABNORMAL HIGH (ref 0.44–1.00)
GFR, EST AFRICAN AMERICAN: 13 mL/min — AB (ref >60)
GFR, EST NON AFRICAN AMERICAN: 11 mL/min — AB (ref >60)
GLUCOSE: 117 mg/dL — AB (ref 65–99)
POTASSIUM: 3 mmol/L — AB (ref 3.5–5.1)
Phosphorus: 3.1 mg/dL (ref 2.5–4.6)
SODIUM: 143 mmol/L (ref 135–145)

## 2014-07-23 LAB — HEPATIC FUNCTION PANEL
ALBUMIN: 2.8 g/dL — AB (ref 3.5–5.0)
ALK PHOS: 60 U/L (ref 38–126)
ALT: 672 U/L — AB (ref 14–54)
AST: 415 U/L — ABNORMAL HIGH (ref 15–41)
BILIRUBIN INDIRECT: 1.3 mg/dL — AB (ref 0.3–0.9)
Bilirubin, Direct: 0.3 mg/dL (ref 0.1–0.5)
Total Bilirubin: 1.6 mg/dL — ABNORMAL HIGH (ref 0.3–1.2)
Total Protein: 6 g/dL — ABNORMAL LOW (ref 6.5–8.1)

## 2014-07-23 LAB — GLUCOSE, CAPILLARY
GLUCOSE-CAPILLARY: 98 mg/dL (ref 65–99)
Glucose-Capillary: 102 mg/dL — ABNORMAL HIGH (ref 65–99)
Glucose-Capillary: 105 mg/dL — ABNORMAL HIGH (ref 65–99)
Glucose-Capillary: 106 mg/dL — ABNORMAL HIGH (ref 65–99)
Glucose-Capillary: 120 mg/dL — ABNORMAL HIGH (ref 65–99)
Glucose-Capillary: 95 mg/dL (ref 65–99)
Glucose-Capillary: 99 mg/dL (ref 65–99)

## 2014-07-23 LAB — PROTIME-INR
INR: 1.54 — ABNORMAL HIGH (ref 0.00–1.49)
INR: 1.63 — ABNORMAL HIGH (ref 0.00–1.49)
Prothrombin Time: 18.6 seconds — ABNORMAL HIGH (ref 11.6–15.2)
Prothrombin Time: 19.4 seconds — ABNORMAL HIGH (ref 11.6–15.2)

## 2014-07-23 LAB — MAGNESIUM: MAGNESIUM: 1.2 mg/dL — AB (ref 1.7–2.4)

## 2014-07-23 LAB — ABO/RH: ABO/RH(D): B POS

## 2014-07-23 LAB — PROCALCITONIN: PROCALCITONIN: 1.92 ng/mL

## 2014-07-23 LAB — HEPARIN LEVEL (UNFRACTIONATED): Heparin Unfractionated: 0.54 IU/mL (ref 0.30–0.70)

## 2014-07-23 LAB — VANCOMYCIN, RANDOM: Vancomycin Rm: 10 ug/mL

## 2014-07-23 LAB — APTT: APTT: 54 s — AB (ref 24–37)

## 2014-07-23 MED ORDER — SODIUM CHLORIDE 0.9 % IV SOLN: 250.0000 mL | INTRAVENOUS | Status: DC | PRN

## 2014-07-23 MED ORDER — VANCOMYCIN HCL IN DEXTROSE 1-5 GM/200ML-% IV SOLN
1000.0000 mg | INTRAVENOUS | Status: DC
  Administered 2014-07-23: 1000 mg via INTRAVENOUS
  Filled 2014-07-23: qty 200

## 2014-07-23 MED ORDER — SODIUM CHLORIDE 0.9 % IJ SOLN
3.0000 mL | Freq: Two times a day (BID) | INTRAMUSCULAR | Status: DC
  Administered 2014-07-23 – 2014-07-24 (×3): 3 mL via INTRAVENOUS

## 2014-07-23 MED ORDER — FAMOTIDINE 40 MG/5ML PO SUSR
20.0000 mg | Freq: Every day | ORAL | Status: DC
  Administered 2014-07-24: 20 mg
  Filled 2014-07-23 (×2): qty 2.5

## 2014-07-23 MED ORDER — SODIUM CHLORIDE 0.9 % IV SOLN: INTRAVENOUS | Status: DC

## 2014-07-23 MED ORDER — HEPARIN (PORCINE) IN NACL 100-0.45 UNIT/ML-% IJ SOLN
1000.0000 [IU]/h | INTRAMUSCULAR | Status: DC
  Administered 2014-07-23 – 2014-07-24 (×2): 1100 [IU]/h via INTRAVENOUS
  Filled 2014-07-23 (×2): qty 250

## 2014-07-23 MED ORDER — SODIUM CHLORIDE 0.9 % IJ SOLN: 3.0000 mL | INTRAMUSCULAR | Status: DC | PRN

## 2014-07-23 MED ORDER — ASPIRIN 81 MG PO CHEW: 81.0000 mg | CHEWABLE_TABLET | ORAL | Status: DC

## 2014-07-23 MED ORDER — SODIUM CHLORIDE 0.9 % IV SOLN
INTRAVENOUS | Status: DC
  Administered 2014-07-23 – 2014-07-24 (×2): via INTRAVENOUS

## 2014-07-23 NOTE — Consult Note (Signed)
Reason for Consult:Progressive kidney disease Referring Physician: Craige Cotta, MD  [name] is an 69 y.o. female.  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 Post arrest she was in refractory shock despite IVF's and pressors.  She was admitted to MICU and was found to have acute onset mitral stenosis due to prosthetic valve dysfunction (one of the 2 leaflets stuck in the closed position and therefore not allowing forward flow).  She will require redo MVR as well as coronary angiography.  We were asked to help further evaluate and manage her worsening renal function.  We do not have any labs between February 2015 and her admission 07/21/14.  She had been on lisinopril prior to admission but has since been held.  CREATININE, SER  Date/Time Value Ref Range Status  07/23/2014 05:00 AM 3.94* 0.44 - 1.00 mg/dL Final  97/35/3299 24:26 PM 3.75* 0.44 - 1.00 mg/dL Final  83/41/9622 29:79 AM 3.62* 0.44 - 1.00 mg/dL Final  89/21/1941 74:08 AM 3.53* 0.44 - 1.00 mg/dL Final  14/48/1856 31:49 AM 3.65* 0.44 - 1.00 mg/dL Final  70/26/3785 88:50 PM 3.32* 0.44 - 1.00 mg/dL Final  27/74/1287 86:76 PM 3.71* 0.44 - 1.00 mg/dL Final  72/09/4707 62:83 PM 3.57* 0.44 - 1.00 mg/dL Final  66/29/4765 46:50 PM 1.48* 0.50 - 1.10 mg/dL Final  35/46/5681 27:51 AM 1.07  Final  01/14/2007 05:30 AM 1.06  Final  01/13/2007 03:15 PM 1.00  Final  01/12/2007 02:30 PM 0.99  Final    PMH:   Past Medical History  Diagnosis Date  . Hypertension   . Hyperlipidemia   . S/P MVR (mitral valve replacement) 04/11/1998    #29 St. Jude bileaflet mechanical valve for bacterial endocarditis  . 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 Prosthetic valve dysfunction 07/22/2014     Acute mitral stenosis due to restricted leaflet mobility of bileaflet mechanical mitral valve prosthesis  . Long term current use of anticoagulant therapy for mechanical mitral valve 04/11/1998    PSH:   Past Surgical History  Procedure Laterality Date  . Mitral valve replacement  04/11/1998    St. Jude mechanical MVR (severe MR, episode of streptococcal bacterial endocarditis - Dr. Cornelius Moras  . Tubal ligation    . Transthoracic echocardiogram  11/2009    EF=>55%; bi-leaflet St. Jude mech prosthesis; mild TR, RVSP elevated at 40-68mmHg, mod pulm HTN; trace AV regurg; mild pulm valve regurg  . Total abdominal hysterectomy w/ bilateral salpingoophorectomy  12/14/1998    Allergies:  Allergies  Allergen Reactions  . Augmentin [Amoxicillin-Pot Clavulanate]     Medications:   Prior to Admission medications   Medication Sig Start Date End Date Taking? Authorizing Provider  carvedilol (COREG) 6.25 MG tablet Take 1 tablet (6.25 mg total) by mouth 2 (two) times daily with a meal. 03/11/14  Yes Chrystie Nose, MD  lisinopril (PRINIVIL,ZESTRIL) 20 MG tablet Take 1 tablet (20 mg total) by mouth daily. 03/11/14  Yes Chrystie Nose, MD  simvastatin (ZOCOR) 40 MG tablet Take 1 tablet (40 mg total) by mouth daily. 03/11/14  Yes Chrystie Nose, MD  triamterene-hydrochlorothiazide (DYAZIDE) 37.5-25 MG per capsule Take 1 each (1 capsule total) by mouth daily. 03/11/14  Yes Chrystie Nose, MD  warfarin (COUMADIN) 3  MG tablet TAKE ONE TO ONE & ONE-HALF TABLETS BY MOUTH ONCE DAILY AS DIRECTED 12/24/13   Chrystie Nose, MD    Inpatient medications: . sodium chloride   Intravenous Once  . antiseptic oral rinse  7 mL Mouth Rinse QID  . aspirin  81 mg Oral Pre-Cath  . chlorhexidine  15 mL Mouth Rinse BID  . famotidine (PEPCID) IV  20 mg Intravenous Daily  . hydrocortisone sodium succinate  50 mg Intravenous Q6H  . imipenem-cilastatin  250 mg Intravenous Q12H  . insulin aspart  2-6 Units Subcutaneous 6 times per day   . [START ON 07/24/2014] levofloxacin (LEVAQUIN) IV  500 mg Intravenous Q48H  . sodium chloride  3 mL Intravenous Q12H  . vancomycin  1,000 mg Intravenous Q24H    Discontinued Meds:   Medications Discontinued During This Encounter  Medication Reason  . aztreonam (AZACTAM) injection 2 g Entry Error  . aspirin chewable tablet 324 mg   . clindamycin (CLEOCIN) IVPB 600 mg   . aztreonam (AZACTAM) 2 g in dextrose 5 % 50 mL IVPB   . sodium bicarbonate 150 mEq in sterile water 1,000 mL infusion   . heparin injection 5,000 Units   . propofol (DIPRIVAN) 1000 MG/100ML infusion   . 0.9 %  sodium chloride infusion   . hydrocortisone sodium succinate (SOLU-CORTEF) 100 MG injection 50 mg Inpatient Standard  . famotidine (PEPCID) IVPB 20 mg premix Inpatient Standard  . sodium chloride 0.9 % bolus 2,000 mL Inpatient Standard  . norepinephrine (LEVOPHED) 4 mg in dextrose 5 % 250 mL (0.016 mg/mL) infusion Change in therapy  . levofloxacin (LEVAQUIN) IVPB 750 mg   . heparin ADULT infusion 100 units/mL (25000 units/250 mL)   . famotidine (PEPCID) IVPB 20 mg premix   . sodium bicarbonate 150 mEq in sterile water 1,000 mL infusion     Social History:  reports that she quit smoking about 41 years ago. She has never used smokeless tobacco. She reports that she does not drink alcohol or use illicit drugs.  Family History:   Family History  Problem Relation Age of Onset  . Lung cancer Mother   . Heart failure Maternal Grandmother   . Heart failure Maternal Grandfather     Pertinent items are noted in HPI. Weight change: 3.2 kg (7 lb 0.9 oz)  Intake/Output Summary (Last 24 hours) at 07/23/14 1237 Last data filed at 07/23/14 1100  Gross per 24 hour  Intake 2276.4 ml  Output    635 ml  Net 1641.4 ml   BP 100/78 mmHg  Pulse 97  Temp(Src) 97.7 F (36.5 C) (Oral)  Resp 20  Ht  (1.676 m)  Wt 76.8 kg (169 lb 5 oz)  BMI 27.34 kg/m2  SpO2 100% Filed Vitals:   07/23/14 0900 07/23/14 1000  07/23/14 1100 07/23/14 1159  BP: 76/38 92/75 100/78   Pulse: 97 97    Temp:    97.7 F (36.5 C)  TempSrc:    Oral  Resp: Height:      Weight:      SpO2: 100% 100%       General appearance: alert, cooperative and intubated Head: Normocephalic, without obvious abnormality, atraumatic Eyes: negative findings: lids and lashes normal, conjunctivae and sclerae normal and corneas clear Resp: occasional rhonchi bilaterally Cardio: regular rate and rhythm and no rub GI: soft, non-tender; bowel sounds normal; no masses,  no organomegaly Extremities: extremities normal, atraumatic, no cyanosis or edema  Labs: Basic Metabolic Panel:  Recent Labs Lab 07/21/14 1207 07/21/14 1436 07/21/14 1600 07/21/14 2036 07/22/14 0130 07/22/14 0400 07/22/14 0940 07/22/14 1425 07/23/14 0500  NA 139  --  144 142 141 142 144 144 143  K 5.1  --  5.4* 4.1 3.6 3.5 3.1* 2.9* 3.0*  CL 112*  --  114* 113* 112* 114* 113* 109 109  CO2 11*  --  13* 13* 16* 18* 21* 23 22  GLUCOSE 179*  --  220* 214* 176* 186* 127* 122* 117*  BUN 57*  --  58* 58* 61* 62* 62* 63* 70*  CREATININE 3.57*  --  3.71* 3.32* 3.65* 3.53* 3.62* 3.75* 3.94*  ALBUMIN 3.0*  --   --   --   --   --   --   --  2.8*  2.8*  CALCIUM 10.8*  --  8.8* 8.0* 8.6* 8.8* 8.8* 9.1 10.0  PHOS  --  5.9*  --   --   --  1.4*  --   --  3.1   Liver Function Tests:  Recent Labs Lab 07/21/14 1207 07/23/14 0500  AST 72* 415*  ALT 49 672*  ALKPHOS 56 60  BILITOT 2.4* 1.6*  PROT 6.7 6.0*  ALBUMIN 3.0* 2.8*  2.8*   No results for input(s): LIPASE, AMYLASE in the last 168 hours. No results for input(s): AMMONIA in the last 168 hours. CBC:  Recent Labs Lab 07/21/14 1207 07/22/14 0400 07/23/14 0500  WBC 8.2 12.0* 16.2*  HGB 14.4 13.4 11.0*  HCT 43.6 37.8 31.6*  MCV 87.2 82.2 83.8  PLT 159 135* 143*   PT/INR: @LABRCNTIP (inr:5) Cardiac Enzymes: ) Recent Labs Lab 07/21/14 1436 07/21/14 2036 07/22/14 0130  TROPONINI 1.33* 1.28*  1.55*   CBG:  Recent Labs Lab 07/22/14 1512 07/22/14 2017 07/23/14 0036 07/23/14 0346 07/23/14 0752  GLUCAP 102* 106* 120* 99 95    Iron Studies: No results for input(s): IRON, TIBC, TRANSFERRIN, FERRITIN in the last 168 hours.  Xrays/Other Studies: Dg Chest Port 1 View  07/23/2014   CLINICAL DATA:  CHF.  EXAM: PORTABLE CHEST - 1 VIEW  COMPARISON:  07/22/2014  FINDINGS: Endotracheal tube is in place, unchanged in position. Left IJ central line tip overlies the level of superior vena cava. Nasogastric tube tip is beyond the gastroesophageal junction, tip off the film.  There are persistent bilateral lung opacities which obscure the hemidiaphragms. Bilateral pleural effusions are present. Perihilar infiltrates appear stable.  IMPRESSION: Persistent bilateral infiltrates and bilateral pleural effusions.   Electronically Signed   By: Norva Pavlov M.D.   On: 07/23/2014 07:38   Dg Chest Port 1 View  07/22/2014   CLINICAL DATA:  Acute respiratory failure. On ventilator. Septic shock. Cardiac arrest.  EXAM: PORTABLE CHEST - 1 VIEW  COMPARISON:  07/21/2014  FINDINGS: Support lines and tubes in appropriate position. Symmetric bilateral mid and lower lung airspace disease and small to moderate bilateral pleural effusion show no significant change. Heart size is stable.  IMPRESSION: Symmetric airspace disease and bilateral pleural effusions, without significant change.   Electronically Signed   By: Myles Rosenthal M.D.   On: 07/22/2014 07:12   Dg Chest Port 1 View  07/21/2014   CLINICAL DATA:  Respiratory failure  EXAM: PORTABLE CHEST - 1 VIEW  COMPARISON:  07/21/2014  FINDINGS: Cardiac shadow is stable. Postsurgical changes are again seen. An endotracheal tube is again identified 3.5 cm above the carina. A left jugular central line is noted in satisfactory  position as is a nasogastric catheter. Bilateral pleural effusions are seen increased from the prior exam. Bibasilar infiltrative changes are noted  as well. Stable changes in the left apex are seen.  IMPRESSION: Stable patchy opacities are noted within the bases as well as the left upper lobe. Increasing bilateral pleural effusions are seen.  Tubes and lines as described.   Electronically Signed   By: Alcide Clever M.D.   On: 07/21/2014 17:22   Dg Chest Portable 1 View  07/21/2014   CLINICAL DATA:  Cardiac arrest.  Hypoxia.  EXAM: PORTABLE CHEST - 1 VIEW  COMPARISON:  January 13, 2007  FINDINGS: The endotracheal tube tip is at the level of T1, approximately 11 cm above the carina. Nasogastric tube tip is in the stomach with the side port at the gastroesophageal junction. No pneumothorax. There is interstitial and patchy alveolar edema throughout the lungs bilaterally, slightly more on the right than on the left. Heart is enlarged with pulmonary venous hypertension. Patient is status post mitral valve replacement.  IMPRESSION: Tube positions as described without pneumothorax. Note that both the endotracheal tube and nasogastric tube may warrant advancing. There is evidence of underlying congestive heart failure.   Electronically Signed   By: Bretta Bang III M.D.   On: 07/21/2014 13:56   Dg Chest Port 1v Same Day  07/21/2014   CLINICAL DATA:  Central line placement  EXAM: PORTABLE CHEST - 1 VIEW SAME DAY  COMPARISON:  07/21/2014  FINDINGS: Cardiomediastinal silhouette is stable. Persistent interstitial prominence bilateral consistent with mild pulmonary edema with slight improvement in aeration. Endotracheal tube again noted with tip at the level of T1 vertebral body. NG tube in place. There is left IJ central line with tip in SVC. No pneumothorax.  IMPRESSION: Persistent mild interstitial prominence bilateral consistent with pulmonary edema with slight improvement in aeration. Again noted endotracheal tube with tip at T1 level. Stable NG tube position. Left IJ central line with tip in SVC. No pneumothorax.   Electronically Signed   By: Natasha Mead M.D.    On: 07/21/2014 15:26     Assessment/Plan: 1.  AKI/CKD- oliguric.  Unclear baseline Scr.  Has h/o kidney disease as a child but had normal Scr in 2009.  Likely related to acute decompensated CHF related to cardiogenic shock and valvular dysfunction in setting of Ace-Inhibitor 1. Cont to hold ACE 2. Will check FeNa and follow.  3. Renal US for completeness in work up.  4. Also discussed risks of IV contrast with cardiac cath however she will need urgent repair of MVR and is aware of risks and willing to proceed.  Just ask to limit contrast for procedure and hydrate pre and post cath.   5. No indication for dialysis and will cont to follow as well as try to obtain outpt records to give [place] a better idea of her baseline Scr but may require CVVHD in near future if she remains oliguric and on pressors. 2. Cardiogenic shock due to acute onset mitral stenosis due to prosthetic valve dysfunction 1. For cardiac cath and eventual MVR (redo) per Dr. Cornelius Moras. 2. Continue with pressors for now and supportive care 3. H/o MVR now with mitral stenosis - CT surgery following and await cardiac cath results before proceeding with MVR. 4. Metabolic acidosis due to #1 improving with isotonic bicarb gtt. 5. SIRS- on pressors 6. VDRF- per PCCM 7. Abnormal LFT's- likely related to #2 and #3.  Cont to follow.  8. Hypokalemia- replete and  follow   Tonna Palazzi A 07/23/2014, 12:37 PM

## 2014-07-23 NOTE — Consult Note (Addendum)
[address]       [place] [address]             [phone]          CARDIOTHORACIC SURGERY CONSULTATION REPORT  PCP is No PCP Per Patient Referring Provider is Jerline Pain, MD Primary Cardiologist is Pixie Casino, MD   Reason for consultation:  Prosthetic valve dysfunction  HPI:  Patient is a 69 year old African-American female with history of hypertension who underwent mitral valve replacement using a 29 mm St. Jude bileaflet mechanical valve in 2000 for bacterial endocarditis complicated by severe mitral regurgitation.  She recovered uneventfully. She was followed for several years by Dr. Melvern Banker and for a brief period of time was noncompliant with Coumadin therapy.  She did not suffer any significant complications at that time.  More recently she has been compliant with Coumadin therapy and followed by Dr. Debara Pickett who saw her most recently on 03/11/2014 at which time she was reportedly doing very well.  She presented acutely to the emergency department on the afternoon of 07/21/2014 with a several day history of feeling poorly with nonspecific complaints including abdominal discomfort, shortness of breath, and fatigue. She was found to be profoundly hypotensive in the emergency department with lactic acidosis. She suffered 2 PEA arrests totaling a proximally 5 minutes in duration. She was intubated and resuscitated with volume and vasoactive agents, and admitted to the intensive care unit. She has been treated with broad-spectrum antibiotics.  INR was 2.55 at the time of presentation.  Transthoracic echocardiogram demonstrated normal left ventricular size and systolic function with ejection fraction 55-60%. Motion of the leaflets of the mechanical valve in the mitral position appeared restricted and there appeared to be elevation of diastolic gradients suggesting the possibility of prosthetic valve dysfunction. Transesophageal echocardiogram was performed  demonstrating restricted leaflet mobility particularly involving the posterior leaflet of the bileaflet mechanical prosthesis. There was not significant mitral regurgitation. Fluoroscopy was performed also confirming the presence of prosthetic valve dysfunction with restricted leaflet mobility. Cardiothoracic surgical consultation was requested.  Patient is divorced and lives locally in [place]. 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 She has 3 adult children who live nearby and are very supportive. Up until her current illness she has been completely functionally independent with normal quality of life.  She has been compliant with long-term anticoagulation using warfarin. She has not had any common occasions related to warfarin therapy.  Prior to her acute illness she exercised on a regular basis and reported no significant history of exertional shortness of breath or chest discomfort.    Past Medical History  Diagnosis Date  . Hypertension   . Hyperlipidemia   . S/P MVR (mitral valve replacement) 04/11/1998    #29 St. Jude bileaflet mechanical valve for bacterial endocarditis  . 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 Long term current use of anticoagulant therapy for mechanical mitral valve 04/11/1998    Past Surgical History  Procedure Laterality Date  . Mitral valve replacement  04/11/1998    St. Jude mechanical MVR (severe MR, episode of streptococcal bacterial endocarditis - Dr. Roxy Manns  . Tubal ligation    . Transthoracic echocardiogram  11/2009    EF=>55%; bi-leaflet St. Jude mech prosthesis; mild TR, RVSP  elevated at 40-69mHg, mod pulm HTN; trace AV regurg; mild pulm valve regurg  . Total abdominal hysterectomy w/ bilateral salpingoophorectomy  12/14/1998    Family  History  Problem Relation Age of Onset  . Lung cancer Mother   . Heart failure Maternal Grandmother   . Heart failure Maternal Grandfather     History   Social History  . Marital Status: Divorced    Spouse Name: N/A  . Number of Children: 3  . Years of Education: 14   Occupational History  . Not on file.   Social History Main Topics  . Smoking status: Former Smoker    Quit date: 01/17/1973  . Smokeless tobacco: Never Used  . Alcohol Use: No  . Drug Use: No  . Sexual Activity: Not on file   Other Topics Concern  . Not on file   Social History Narrative    Prior to Admission medications   Medication Sig Start Date End Date Taking? Authorizing Provider  carvedilol (COREG) 6.25 MG tablet Take 1 tablet (6.25 mg total) by mouth 2 (two) times daily with a meal. 03/11/14  Yes KPixie Casino MD  lisinopril (PRINIVIL,ZESTRIL) 20 MG tablet Take 1 tablet (20 mg total) by mouth daily. 03/11/14  Yes KPixie Casino MD  simvastatin (ZOCOR) 40 MG tablet Take 1 tablet (40 mg total) by mouth daily. 03/11/14  Yes KPixie Casino MD  triamterene-hydrochlorothiazide (DYAZIDE) 37.5-25 MG per capsule Take 1 each (1 capsule total) by mouth daily. 03/11/14  Yes KPixie Casino MD  warfarin (COUMADIN) 3 MG tablet TAKE ONE TO ONE & ONE-HALF TABLETS BY MOUTH ONCE DAILY AS DIRECTED 12/24/13   KPixie Casino MD    Current Facility-Administered Medications  Medication Dose Route Frequency Provider Last Rate Last Dose  . 0.9 %  sodium chloride infusion  250 mL Intravenous PRN PCorey Harold NP      . 0.9 %  sodium chloride infusion   Intravenous Once SAnders Simmonds MD      . antiseptic oral rinse (CLomira/ CETYLPYRIDINIUM CHLORIDE 0.05%) solution 7 mL  7 mL Mouth Rinse QID DRaylene Miyamoto MD   7 mL at 07/23/14 0411  . chlorhexidine (PERIDEX) 0.12 % solution 15 mL  15 mL Mouth Rinse BID DRaylene Miyamoto MD   15 mL at 07/22/14 1943  . EPINEPHrine (ADRENALIN) 4 mg in dextrose 5 % 250 mL (0.016  mg/mL) infusion  0.5-20 mcg/min Intravenous Titrated SVirgel Manifold MD   Stopped at 07/21/14 1747  . famotidine (PEPCID) IVPB 20 mg premix  20 mg Intravenous Daily MWynell Balloon RPH      . fentaNYL (SUBLIMAZE) injection 50 mcg  50 mcg Intravenous Q2H PRN PCorey Harold NP   50 mcg at 07/22/14 08299 . heparin ADULT infusion 100 units/mL (25000 units/250 mL)  1,100 Units/hr Intravenous Continuous VLaren Everts RPH 11 mL/hr at 07/23/14 0404 1,100 Units/hr at 07/23/14 0404  . hydrocortisone sodium succinate (SOLU-CORTEF) 100 MG injection 50 mg  50 mg Intravenous Q6H PCorey Harold NP   50 mg at 07/23/14 0414  . imipenem-cilastatin (PRIMAXIN) 250 mg in sodium chloride 0.9 % 100 mL IVPB  250 mg Intravenous Q12H NCecilio AsperBatchelder, RPH   250 mg at 07/23/14 0400  . insulin aspart (novoLOG) injection 2-6 Units  2-6 Units Subcutaneous 6 times per day PCorey Harold NP   4 Units at 07/22/14 [phone] . [START ON 07/24/2014] levofloxacin (LEVAQUIN) IVPB 500  mg  500 mg Intravenous Q48H Franky Macho, RPH      . norepinephrine (LEVOPHED) 16 mg in dextrose 5 % 250 mL (0.064 mg/mL) infusion  0-40 mcg/min Intravenous Titrated Raylene Miyamoto, MD   Stopped at 07/23/14 0600  . vancomycin (VANCOCIN) IVPB 1000 mg/200 mL premix  1,000 mg Intravenous Q24H Laren Everts, RPH      . vasopressin (PITRESSIN) 40 Units in sodium chloride 0.9 % 250 mL (0.16 Units/mL) infusion  0.03 Units/min Intravenous Continuous Corey Harold, NP   Stopped at 07/22/14 1000    Allergies  Allergen Reactions  . Augmentin [Amoxicillin-Pot Clavulanate]       Review of Systems:  Limited review of systems due to patient's condition.  According to the patient and her daughter she was doing well until several days ago when she developed fairly acute onset of generalized malaise, vague abdominal discomfort, and shortness of breath. The patient denies any associated history of febrile illness or productive cough. She has been compliant  with Coumadin therapy.  The remainder of her review of systems is noncontributory.     Physical Exam:   BP 86/64 mmHg  Pulse 81  Temp(Src) 97.6 F (36.4 C) (Oral)  Resp 20  Ht 5' 6"  (1.676 m)  Wt 76.8 kg (169 lb 5 oz)  BMI 27.34 kg/m2  SpO2 100%  General:  Intubated but alert, looks comfortable and denies pain or SOB   HEENT:  Unremarkable   Neck:   no JVD, no bruits, no adenopathy   Chest:   Symmetrical breath sounds, + scattered rhonchi   CV:   RRR, no murmur   Abdomen:  soft, non-tender, no masses   Extremities:  warm, well-perfused, pulses diminished but + Doppler, no lower extremity edema  Rectal/GU  Deferred  Neuro:   Grossly non-focal and symmetrical throughout  Skin:   Clean and dry, no rashes, no breakdown  Diagnostic Tests:  Transthoracic Echocardiography  Patient:  Denyce Robert MR #:    [id number] Study Date: 07/21/2014 Gender:   F Age:    64 Height:   167.6 cm Weight:   74.8 kg BSA:    1.88 m^2 Pt. Status: Room:  SONOGRAPHER Tresa Res, RDCS SONOGRAPHER [facility] ATTENDING  Earley Brooke A REFERRING  Darlina Sicilian A PERFORMING  [facility], Inpatient  cc:  ------------------------------------------------------------------- LV EF: 55% -  60%  ------------------------------------------------------------------- Indications:   Cardiac arrest 427.5.  ------------------------------------------------------------------- History:  PMH:  Mitral valve disease. Risk factors: Hypertension.  ------------------------------------------------------------------- Study Conclusions  - Left ventricle: The cavity size was normal. Wall thickness was normal. Systolic function was normal. The estimated ejection fraction was in the range of 55% to 60%. - Aortic valve: There was mild regurgitation. - Mitral valve: Mechanical MVR. Disc motin not seen and appears reduced  compared to echo done in 2015 along with elevation in diastolic gradients suggests possibility of disc malfunction. Shadowing in [place] may obscure MR. Suggest TEE or fluoro to further assess valve. - Left atrium: The atrium was mildly dilated. - Right ventricle: The cavity size was mildly dilated. - Right atrium: The atrium was mildly dilated. - Tricuspid valve: Marked elevation in PA pressure since echo done 2015 may be related to MVR dysfunction In setting of arrest may need to r/o PE. There was severe regurgitation. - Pulmonary arteries: PA peak pressure: 80 mm Hg (S).  ------------------------------------------------------------------- Labs, prior tests, procedures, and surgery: Mitral valve replacement.  Transthoracic echocardiography.  M-mode, complete 2D, spectral Doppler, and color Doppler. Birthdate: Patient birthdate: 31-Jul-1945. Age: Patient is 69 yr old. Sex: Gender: female. BMI: 26.6 kg/m^2. Blood pressure:   129/92 Patient status: Inpatient. Study date: Study date: 07/21/2014. Study time: 02:47 PM. Location: ICU/CCU  -------------------------------------------------------------------  ------------------------------------------------------------------- Left ventricle: The cavity size was normal. Wall thickness was normal. Systolic function was normal. The estimated ejection fraction was in the range of 55% to 60%.  ------------------------------------------------------------------- Aortic valve:  Mildly thickened leaflets. Doppler: There was mild regurgitation.  ------------------------------------------------------------------- Aorta: The aorta was normal, not dilated, and non-diseased.  ------------------------------------------------------------------- Mitral valve: Mechanical MVR. Disc motin not seen and appears reduced compared to echo done in 2015 along with elevation in diastolic gradients suggests possibility of disc  malfunction. Shadowing in [place] may obscure MR. Suggest TEE or fluoro to further assess valve.  ------------------------------------------------------------------- Left atrium: The atrium was mildly dilated.  ------------------------------------------------------------------- Atrial septum: Poorly visualized.  ------------------------------------------------------------------- Right ventricle: The cavity size was mildly dilated.  ------------------------------------------------------------------- Pulmonic valve:  Doppler: There was mild regurgitation.  ------------------------------------------------------------------- Tricuspid valve: Marked elevation in PA pressure since echo done 2015 may be related to MVR dysfunction In setting of arrest may need to r/o PE. Doppler: There was severe regurgitation.  ------------------------------------------------------------------- Right atrium: The atrium was mildly dilated.  ------------------------------------------------------------------- Pericardium: The pericardium was normal in appearance.  ------------------------------------------------------------------- Post procedure conclusions Ascending Aorta:  - The aorta was normal, not dilated, and non-diseased.  ------------------------------------------------------------------- Measurements  Left ventricle             Value    Reference LV ID, ED, PLAX chordal    (L)   30.9 mm   43 - 52 LV ID, ES, PLAX chordal        23.4 mm   23 - 38 LV fx shortening, PLAX chordal (L)   24  %   >=29 LV PW thickness, ED          9.43 mm   --------- IVS/LV PW ratio, ED          1.05     <=1.3  Ventricular septum           Value    Reference IVS thickness, ED           9.92 mm   ---------  LVOT                  Value    Reference LVOT ID, S                14  mm   --------- LVOT area               1.54 cm^2  ---------  Aorta                 Value    Reference Aortic root ID, ED           24  mm   ---------  Left atrium              Value    Reference LA ID, A-P, ES             36  mm   --------- LA ID/bsa, A-P             1.91 cm/m^2 <=2.2  Pulmonary arteries           Value    Reference PA pressure, S, DP       (H)   80  mm Hg <=  30  Tricuspid valve            Value    Reference Tricuspid regurg peak velocity     403  cm/s  --------- Tricuspid peak RV-RA gradient     65  mm Hg ---------  Systemic veins             Value    Reference Estimated CVP             15  mm Hg ---------  Right ventricle            Value    Reference RV pressure, S, DP       (H)   80  mm Hg <=30  Legend: (L) and (H) mark values outside specified reference range.  ------------------------------------------------------------------- Prepared and Electronically Authenticated by  Jenkins Rouge, M.D. 2016-07-14T17:55:52    Transesophageal Echocardiography  Patient:  Denyce Robert MR #:    [id number] Study Date: 07/22/2014 Gender:   F Age:    29 Height:   167.6 cm Weight:   73.6 kg BSA:    1.87 m^2 Pt. Status: Room:    2M06C  ADMITTING  Raylene Miyamoto ATTENDING  Benancio Deeds, M.D. PERFORMING  Candee Furbish, M.D. REFERRING  Candee Furbish, M.D. SONOGRAPHER Tresa Res, RDCS  cc:  ------------------------------------------------------------------- LV EF: 55% -  60%  ------------------------------------------------------------------- Indications:   Mitral stenosis [non-rheumatic]  424.0.  ------------------------------------------------------------------- Study Conclusions  - Left ventricle: The cavity size was normal. Wall thickness was normal. Systolic function was normal. The estimated ejection fraction was in the range of 55% to 60%. - Mitral valve: A mechanical prosthesis was present and functioning abnormally. The prosthesis posterior leafletwas fixed in the closed position. There was evidence for pannus formation. The findings are consistent with severe stenosis. There was mild regurgitation directed centrally. Mean gradient (D): 27 mm Hg. - Pulmonary arteries: Possible PA thrombus versus shadow artifact. Note INR 2.6 on admission.  Impressions:  - TCTS consult requested.  Recommendations:  1. This procedure has been discussed with the referring physician. 2. Images viewed by colleague as well.  Diagnostic transesophageal echocardiography. 2D and color Doppler. Birthdate: Patient birthdate: March 22, 1945. Age: Patient is 69 yr old. Sex: Gender: female.  BMI: 26.2 kg/m^2. Blood pressure: 72/50 Patient status: Inpatient. Study date: Study date: 07/22/2014. Study time: 11:57 AM. Location: Bedside.  -------------------------------------------------------------------  ------------------------------------------------------------------- Left ventricle: The cavity size was normal. Wall thickness was normal. Systolic function was normal. The estimated ejection fraction was in the range of 55% to 60%.  ------------------------------------------------------------------- Aortic valve:  Mildly thickened, mildly calcified leaflets. Cusp separation was normal. No evidence of vegetation. Doppler: There was no regurgitation.  ------------------------------------------------------------------- Aorta: The aorta was normal, not dilated, and  non-diseased.  ------------------------------------------------------------------- Mitral valve: A mechanical prosthesis was present and functioning abnormally. The prosthesis posterior leafletwas fixed in the closed position. There was evidence for pannus formation. Doppler:  The findings are consistent with severe stenosis.  There was mild regurgitation directed centrally.  Mean gradient (D): 27 mm Hg.  ------------------------------------------------------------------- Left atrium: The atrium was normal in size. No evidence of thrombus in the app endage. The appendage was of normal size. Emptying velocity was normal.  ------------------------------------------------------------------- Right ventricle: The cavity size was normal. Wall thickness was normal. Systolic function was normal.  ------------------------------------------------------------------- Pulmonic valve:  Poorly visualized. Structurally normal valve. Cusp separation was normal. No evidence of vegetation. Doppler: There was mild regurgitation.  ------------------------------------------------------------------- Tricuspid valve:  Structurally normal  valve.  Leaflet separation was normal. No evidence of vegetation. Doppler: There was mild regurgitation.  ------------------------------------------------------------------- Pulmonary artery:  The main pulmonary artery was normal-sized. Possible PA thrombus versus shadow artifact. Note INR 2.6 on admission.  ------------------------------------------------------------------- Right atrium: The atrium was normal in size. No evidence of thrombus in the atrial cavity or appendage.  ------------------------------------------------------------------- Pericardium: The pericardium was normal in appearance. There was no pericardial effusion.  ------------------------------------------------------------------- Systemic veins: Superior vena cava: The  study excluded a thrombus.  ------------------------------------------------------------------- Post procedure conclusions Ascending Aorta:  - The aorta was normal, not dilated, and non-diseased.  ------------------------------------------------------------------- Measurements  Mitral valve         Value Mitral mean velocity, D   254  cm/s Mitral mean gradient, D   27  mm Hg Mitral annulus VTI, D    118  cm  Legend: (L) and (H) mark values outside specified reference range.  ------------------------------------------------------------------- Prepared and Electronically Authenticated by  Candee Furbish, M.D. 2016-07-15T13:27:02    CARDIAC FLOUROSCOPY     Indication : Evaluate valve motion on a mechanical St. Jude mitral valve prosthesis  The patient was brought to the cardiac catheterization laboratory to further evaluate mechanical St. Jude prosthetic mitral valve motion.  Fluoroscopy was performed in multiple views. The posterior leaflet was non-mobile and appeared fixed in the close position. The anterior leaflet had motion but there was reduced disc valve excursion The findings would be suggestive of severe mitral stenosis.  Patient tolerated procedure well and was transported back to the medical intensive care unit.   Troy Sine, MD, [facility] 07/22/2014 4:19 PM    Impression:  Patient has presumably acute-onset severe mitral stenosis due to prosthetic valve dysfunction with one of the two leaflets of her bileaflet mechanical mitral valve being stuck in the closed position. There is no definite clot present to suggest valve thrombosis, although this cannot be definitively ruled out. The patient was low-therapeutic on Coumadin with INR 2.55 at the time of presentation.  I suspect that the patient's prosthetic valve dysfunction may be related to pannus ingrowth.  She presented acutely in cardiogenic shock and suffered 2 PEA arrests in  the ED.  She has responded remarkably well to her resuscitation and currently remains hemodynamically stable off all pressors with resolving metabolic acidosis and pulmonary edema.  She may be developing acute renal failure.   Plan:  Patient will need urgent redo mitral valve replacement.  Because she has stabilized nicely following resuscitation she will need diagnostic cardiac catheterization (with minimal contrast) to rule out the presence of significant coronary artery disease prior to surgery. She will need to be seen in consultation by the nephrology team.  I would favor adding gentle IV hydration and treating hypotension with additional volume administration during the interim period of time.  She should be maintained on intravenous heparin while INR < 3.0.  We will continue to follow closely.   I spent in excess of 60 minutes during the conduct of this hospital consultation and >50% of this time involved direct face-to-face encounter for counseling and/or coordination of the patient's care.    Valentina Gu. Roxy Manns, MD 07/23/2014 7:42 AM   ADDENDUM:  I met with the patient and her entire family at the bedside and discussed the nature of the patient's current problem at length. The indications for urgent redo mitral valve replacement were discussed. The timing of surgery was discussed, including a discussion regarding the patient's renal function.  At this point we tentatively plan for  surgery on Monday morning or sooner should the patient develop signs of worsening hemodynamic conditions. Surgical options were discussed including whether or not to replace her dysfunctional mitral valve with another bileaflet mechanical prosthesis or alternatively a bioprosthetic tissue valve.  All of their questions have been addressed.  Rexene Alberts 07/23/2014 11:40 AM

## 2014-07-23 NOTE — Progress Notes (Signed)
ANTICOAGULATION CONSULT NOTE - Follow Up Consult  Pharmacy Consult for heparin Indication: MVR   Labs:  Recent Labs  07/21/14 1207 07/21/14 1436  07/21/14 2036 07/22/14 0130 07/22/14 0400 07/22/14 0500 07/22/14 0800 07/22/14 0940 07/22/14 1425 07/23/14 0142  HGB 14.4  --   --   --   --  13.4  --   --   --   --   --   HCT 43.6  --   --   --   --  37.8  --   --   --   --   --   PLT 159  --   --   --   --  135*  --   --   --   --   --   LABPROT 27.1*  --   --   --   --   --   --  40.0*  --   --  18.6*  INR 2.55*  --   --   --   --   --   --  4.29*  --   --  1.54*  HEPARINUNFRC  --   --   --   --  0.27*  --  0.49  --   --   --   --   CREATININE 3.57*  --   < > 3.32* 3.65* 3.53*  --   --  3.62* 3.75*  --   TROPONINI  --  1.33*  --  1.28* 1.55*  --   --   --   --   --   --   < > = values in this interval not displayed.    Assessment: 69yo female had been on heparin bridge, INR jumped to 4, now reversed and <2.5, to resume heparin while awaiting diagnostic cath.  Goal of Therapy:  Heparin level 0.3-0.7 units/ml Monitor platelets by anticoagulation protocol: Yes   Plan:  Will resume heparin gtt at previously therapeutic rate of 1100 units/hr and monitor heparin levels and CBC.  Vernard Gambles, PharmD, BCPS  07/23/2014,3:03 AM

## 2014-07-23 NOTE — Progress Notes (Signed)
PULMONARY / CRITICAL CARE MEDICINE   Name: [name] MRN: [id number] DOB: 1945-09-05    ADMISSION DATE:  08/02/14  REFERRING MD :  Bonney Roussel EDP  CHIEF COMPLAINT:  Shock  INITIAL PRESENTATION: 69 year old female presented to ED 08-02-22 with AMS. In ED was profoundly hypotensive with elevated lactic, and unfortunately suffered 2 PEA arrests totaling approximately 5 minutes in duration. Post arrest she is in refractory shock despite 4L IVF resuscitation and 2 vasoactive agents. PCCM to admit.  STUDIES:  Echo Aug 02, 2022 > EF 55-60%. Mild aortic regurg. Mechanical MVR. Questionable disc malfunction. RV mildly dilated. PA peak pressure .  SIGNIFICANT EVENTS: 02-Aug-2022 coded in ED, PEA arrest 6 mins. PCCM admit to ICU intubated 08/02/22 Left IJ CVC Placed  SUBJECTIVE:  Off pressors.  VITAL SIGNS: Temp:  [97.2 F (36.2 C)-98.4 F (36.9 C)] 97.7 F (36.5 C) (07/16 1159) Pulse Rate:  [66-104] 97 (07/16 1000) Resp:  [0-23] 20 (07/16 1100) BP: (76-107)/(38-82) 100/78 mmHg (07/16 1100) SpO2:  [100 %] 100 % (07/16 1000) Arterial Line BP: (85-120)/(51-73) 105/69 mmHg (07/16 1100) FiO2 (%):  [40 %] 40 % (07/16 1138) Weight:  [169 lb 5 oz (76.8 kg)] 169 lb 5 oz (76.8 kg) (07/16 0505) HEMODYNAMICS: CVP:  [10 mmHg-21 mmHg] 18 mmHg VENTILATOR SETTINGS: Vent Mode:  [-] PRVC FiO2 (%):  [40 %] 40 % Set Rate:  [20 bmp] 20 bmp Vt Set:  [480 mL] 480 mL PEEP:  [5 cmH20] 5 cmH20 Plateau Pressure:  [22 cmH20-28 cmH20] 24 cmH20 INTAKE / OUTPUT:  Intake/Output Summary (Last 24 hours) at 07/23/14 1225 Last data filed at 07/23/14 1100  Gross per 24 hour  Intake 2276.4 ml  Output    635 ml  Net 1641.4 ml    PHYSICAL EXAMINATION: General: pleasant Neuro: moves all extremities HEENT: ETT in place Cardiovascular:  2/6 SM Lungs: no wheeze Abdomen: soft, non tender Musculoskeletal: no edema Skin: no rashse  LABS:  CBC  Recent Labs Lab 2014/08/02 1207 07/22/14 0400 07/23/14 0500  WBC 8.2  12.0* 16.2*  HGB 14.4 13.4 11.0*  HCT 43.6 37.8 31.6*  PLT 159 135* 143*   Coag's  Recent Labs Lab 07/22/14 0800 07/23/14 0142 07/23/14 0500  APTT  --   --  54*  INR 4.29* 1.54* 1.63*   BMET  Recent Labs Lab 07/22/14 0940 07/22/14 1425 07/23/14 0500  NA 144 144 143  K 3.1* 2.9* 3.0*  CL 113* 109 109  CO2 21* 23 22  BUN 62* 63* 70*  CREATININE 3.62* 3.75* 3.94*  GLUCOSE 127* 122* 117*   Electrolytes  Recent Labs Lab 02-Aug-2014 1436  07/22/14 0400 07/22/14 0940 07/22/14 1425 07/23/14 0500  CALCIUM  --   < > 8.8* 8.8* 9.1 10.0  MG 1.3*  --  1.1*  --   --  1.2*  PHOS 5.9*  --  1.4*  --   --  3.1  < > = values in this interval not displayed.   Sepsis Markers  Recent Labs Lab 02-Aug-2014 1436  08/02/2014 2059 07/22/14 0400 07/22/14 0430 07/22/14 0701 07/23/14 0500  LATICACIDVEN  --   < > 6.1*  --  3.5* 2.9*  --   PROCALCITON 0.31  --   --  2.90  --   --  1.92  < > = values in this interval not displayed.   ABG  Recent Labs Lab 2014/08/02 1800 07/22/14 0002 07/22/14 0503  PHART 7.305* 7.453* 7.599*  PCO2ART 23.7* 21.8* 19.6*  PO2ART  84.0 137.0* 150.0*   Liver Enzymes  Recent Labs Lab 07/21/14 1207 07/23/14 0500  AST 72* 415*  ALT 49 672*  ALKPHOS 56 60  BILITOT 2.4* 1.6*  ALBUMIN 3.0* 2.8*  2.8*   Cardiac Enzymes  Recent Labs Lab 07/21/14 1436 07/21/14 2036 07/22/14 0130  TROPONINI 1.33* 1.28* 1.55*   Glucose  Recent Labs Lab 07/22/14 1324 07/22/14 1512 07/22/14 2017 07/23/14 0036 07/23/14 0346 07/23/14 0752  GLUCAP 113* 102* 106* 120* 99 95    Imaging Dg Chest Port 1 View  07/23/2014   CLINICAL DATA:  CHF.  EXAM: PORTABLE CHEST - 1 VIEW  COMPARISON:  07/22/2014  FINDINGS: Endotracheal tube is in place, unchanged in position. Left IJ central line tip overlies the level of superior vena cava. Nasogastric tube tip is beyond the gastroesophageal junction, tip off the film.  There are persistent bilateral lung opacities which  obscure the hemidiaphragms. Bilateral pleural effusions are present. Perihilar infiltrates appear stable.  IMPRESSION: Persistent bilateral infiltrates and bilateral pleural effusions.   Electronically Signed   By: Norva Pavlov M.D.   On: 07/23/2014 07:38     ASSESSMENT / PLAN:  PULMONARY ETT 7/14 >>> A: Acute hypoxic respiratory failure in setting of mitral valve malfunction, CHF. P:   Full vent support F/u CXR  CARDIOVASCULAR Left IJ CVC 7/14 >>> A:  Shock >> more likely from mitral valve dysfx >> off pressors 7/16. Hx of HTN, HLD. P:  TCTS planning valve re-do 7/18 Cards assessing for cardiac cath Cortisol 50 >> d/c solu cortef 7/16  RENAL A:   Acute Renal Failure. Lactic Acidosis - Improving. Hypokalemia. P:   Even fluid balance Monitor renal fx, urine outpt Renal consulted 7/16  GASTROINTESTINAL A:   Acute Liver Injury 2nd to shock. Nutrition. P:   F/u LFT Tube feeds Pepcid for SUP  HEMATOLOGIC A:   Coagulopathy - Secondary to Coumadin. P:  Heparin gtt F/u CBC  INFECTIOUS A:   Initial concern for septic shock >> more likely hypovolemia and mitral valve dysfx.   P:   D/c Abx 7/16  RSV 7/14 >>> BCx2 7/14 >>> UC 7/14 >>> Sputum 7/14 >>> C-dif 7/14 >>> negative  ENDOCRINE A:   No acute issues. P:   Monitor blood sugar on BMET  NEUROLOGIC A:   Acute metabolic encephalopathy. P:   RASS goal: -1  D/w Dr. Barry Dienes.  CC time 35 minutes.  Coralyn Helling, MD [facility] Pulmonary/Critical Care 07/23/2014, 12:37 PM Pager:  [phone] After 3pm call: [phone]

## 2014-07-23 NOTE — Progress Notes (Addendum)
SUBJECTIVE:  Stable. Intubated.  Able to respond.  No complaints.  OBJECTIVE:   Vitals:   Filed Vitals:   07/23/14 0700 07/23/14 0750 07/23/14 0800 07/23/14 0900  BP: 86/64  91/70 76/38  Pulse:   89 97  Temp:  97.2 F (36.2 C)    TempSrc:  Oral    Resp: Height:      Weight:      SpO2:   100% 100%   I&O's:   Intake/Output Summary (Last 24 hours) at 07/23/14 1021 Last data filed at 07/23/14 1610  Gross per 24 hour  Intake 2362.1 ml  Output    625 ml  Net 1737.1 ml   TELEMETRY: Reviewed telemetry pt in NSR :     PHYSICAL EXAM General: Well developed, well nourished, in no acute distress Head:   Normal cephalic and atramatic  Lungs:  Corase BS bilaterally Heart:   HRRR S1 S2  No JVD.   Abdomen: abdomen soft and non-tender Msk:  Back normal,  Normal strength and tone for age. Extremities:  Tr leg edema.   Neuro: Alert and oriented. Psych:  Normal affect, responds appropriately Skin: No rash   LABS: Basic Metabolic Panel:  Recent Labs  96/04/54 0400  07/22/14 1425 07/23/14 0500  NA 142  < > 144 143  K 3.5  < > 2.9* 3.0*  CL 114*  < > 109 109  CO2 18*  < > 23 22  GLUCOSE 186*  < > 122* 117*  BUN 62*  < > 63* 70*  CREATININE 3.53*  < > 3.75* 3.94*  CALCIUM 8.8*  < > 9.1 10.0  MG 1.1*  --   --  1.2*  PHOS 1.4*  --   --  3.1  < > = values in this interval not displayed. Liver Function Tests:  Recent Labs  07/21/14 1207 07/23/14 0500  AST 72* 415*  ALT 49 672*  ALKPHOS 56 60  BILITOT 2.4* 1.6*  PROT 6.7 6.0*  ALBUMIN 3.0* 2.8*  2.8*   No results for input(s): LIPASE, AMYLASE in the last 72 hours. CBC:  Recent Labs  07/22/14 0400 07/23/14 0500  WBC 12.0* 16.2*  HGB 13.4 11.0*  HCT 37.8 31.6*  MCV 82.2 83.8  PLT 135* 143*   Cardiac Enzymes:  Recent Labs  07/21/14 1436 07/21/14 2036 07/22/14 0130  TROPONINI 1.33* 1.28* 1.55*   BNP: Invalid input(s): POCBNP D-Dimer: No results for input(s): DDIMER in the last 72  hours. Hemoglobin A1C: No results for input(s): HGBA1C in the last 72 hours. Fasting Lipid Panel: No results for input(s): CHOL, HDL, LDLCALC, TRIG, CHOLHDL, LDLDIRECT in the last 72 hours. Thyroid Function Tests: No results for input(s): TSH, T4TOTAL, T3FREE, THYROIDAB in the last 72 hours.  Invalid input(s): FREET3 Anemia Panel: No results for input(s): VITAMINB12, FOLATE, FERRITIN, TIBC, IRON, RETICCTPCT in the last 72 hours. Coag Panel:   Lab Results  Component Value Date   INR 1.63* 07/23/2014   INR 1.54* 07/23/2014   INR 4.29* 07/22/2014    RADIOLOGY: Dg Chest Port 1 View  07/23/2014   CLINICAL DATA:  CHF.  EXAM: PORTABLE CHEST - 1 VIEW  COMPARISON:  07/22/2014  FINDINGS: Endotracheal tube is in place, unchanged in position. Left IJ central line tip overlies the level of superior vena cava. Nasogastric tube tip is beyond the gastroesophageal junction, tip off the film.  There are persistent bilateral lung opacities which obscure the hemidiaphragms. Bilateral pleural effusions  are present. Perihilar infiltrates appear stable.  IMPRESSION: Persistent bilateral infiltrates and bilateral pleural effusions.   Electronically Signed   By: Norva Pavlov M.D.   On: 07/23/2014 07:38   Dg Chest Port 1 View  07/22/2014   CLINICAL DATA:  Acute respiratory failure. On ventilator. Septic shock. Cardiac arrest.  EXAM: PORTABLE CHEST - 1 VIEW  COMPARISON:  07/21/2014  FINDINGS: Support lines and tubes in appropriate position. Symmetric bilateral mid and lower lung airspace disease and small to moderate bilateral pleural effusion show no significant change. Heart size is stable.  IMPRESSION: Symmetric airspace disease and bilateral pleural effusions, without significant change.   Electronically Signed   By: Myles Rosenthal M.D.   On: 07/22/2014 07:12   Dg Chest Port 1 View  07/21/2014   CLINICAL DATA:  Respiratory failure  EXAM: PORTABLE CHEST - 1 VIEW  COMPARISON:  07/21/2014  FINDINGS: Cardiac shadow  is stable. Postsurgical changes are again seen. An endotracheal tube is again identified 3.5 cm above the carina. A left jugular central line is noted in satisfactory position as is a nasogastric catheter. Bilateral pleural effusions are seen increased from the prior exam. Bibasilar infiltrative changes are noted as well. Stable changes in the left apex are seen.  IMPRESSION: Stable patchy opacities are noted within the bases as well as the left upper lobe. Increasing bilateral pleural effusions are seen.  Tubes and lines as described.   Electronically Signed   By: Alcide Clever M.D.   On: 07/21/2014 17:22   Dg Chest Portable 1 View  07/21/2014   CLINICAL DATA:  Cardiac arrest.  Hypoxia.  EXAM: PORTABLE CHEST - 1 VIEW  COMPARISON:  January 13, 2007  FINDINGS: The endotracheal tube tip is at the level of T1, approximately 11 cm above the carina. Nasogastric tube tip is in the stomach with the side port at the gastroesophageal junction. No pneumothorax. There is interstitial and patchy alveolar edema throughout the lungs bilaterally, slightly more on the right than on the left. Heart is enlarged with pulmonary venous hypertension. Patient is status post mitral valve replacement.  IMPRESSION: Tube positions as described without pneumothorax. Note that both the endotracheal tube and nasogastric tube may warrant advancing. There is evidence of underlying congestive heart failure.   Electronically Signed   By: Bretta Bang III M.D.   On: 07/21/2014 13:56   Dg Chest Port 1v Same Day  07/21/2014   CLINICAL DATA:  Central line placement  EXAM: PORTABLE CHEST - 1 VIEW SAME DAY  COMPARISON:  07/21/2014  FINDINGS: Cardiomediastinal silhouette is stable. Persistent interstitial prominence bilateral consistent with mild pulmonary edema with slight improvement in aeration. Endotracheal tube again noted with tip at the level of T1 vertebral body. NG tube in place. There is left IJ central line with tip in SVC. No  pneumothorax.  IMPRESSION: Persistent mild interstitial prominence bilateral consistent with pulmonary edema with slight improvement in aeration. Again noted endotracheal tube with tip at T1 level. Stable NG tube position. Left IJ central line with tip in SVC. No pneumothorax.   Electronically Signed   By: Natasha Mead M.D.   On: 07/21/2014 15:26      ASSESSMENT: Roosvelt Harps:    I discussed the case with Dr. Kirke Corin today and Dr. Anne Fu yesterday.  Prosthetic MV not functioning. Will need replacement. COmplicated by shock and ARF.  I have spoken with Dr. Arrie Aran for renal consult.  High likelihood she will need dialysis at some point. Urine output decreasing.  WIll start gentle hydration to see if this helps renal function.    She will need cath this weekend.  Orders placed.  Consent should be obtained.  Time of cath to be determined but will be done at the latest by tomorrow.   IV heparin for now.  Pressors have been needed for BP.   Vent per CCM.   Corky Crafts, MD  07/23/2014  10:21 AM   Spoke to patients daughter.  Risks and benefits of cath were explained, particularly higher than usual risk of renal failure given her current elevated Cr.  Requiring dialysis.  All questions answered.  Corky Crafts, MD

## 2014-07-23 NOTE — Progress Notes (Addendum)
VASCULAR LAB PRELIMINARY  PRELIMINARY  PRELIMINARY  PRELIMINARY  Pre-op Cardiac Surgery  Carotid Findings:  Right:  1-39% ICA stenosis.  Vertebral artery flow is antegrade.  Left:  Not done due to line in the left side of the neck.  Upper Extremity Right Left  Brachial Pressures 85 triphasic triphasic  Radial Waveforms triphasic triphasic  Ulnar Waveforms triphasic triphasic  Palmar Arch (Allen's Test) * **   Findings:  *Right:  Doppler waveforms remain normal with ulnar and radial compressions.  **Left:  Doppler waveforms remain normal with ulnar and obliterate with radial compressions.  Left brachial blood pressure not obtained due to line in the upper arm.       [name], RVT 07/23/2014, 2:00 PM

## 2014-07-23 NOTE — Progress Notes (Signed)
ANTICOAGULATION CONSULT NOTE - Follow Up Consult  Pharmacy Consult for heparin Indication: MVR  Allergies  Allergen Reactions  . Augmentin [Amoxicillin-Pot Clavulanate]     Patient Measurements: Height: 5\' 6"  (167.6 cm) Weight: 169 lb 5 oz (76.8 kg) IBW/kg (Calculated) : 59.3  Vital Signs: Temp: 97.7 F (36.5 C) (07/16 1159) Temp Source: Oral (07/16 1159) BP: 100/78 mmHg (07/16 1100) Pulse Rate: 97 (07/16 1000)  Labs:  Recent Labs  07/21/14 1207 07/21/14 1436  07/21/14 2036 07/22/14 0130 07/22/14 0400 07/22/14 0500 07/22/14 0800 07/22/14 0940 07/22/14 1425 07/23/14 0142 07/23/14 0500 07/23/14 1129  HGB 14.4  --   --   --   --  13.4  --   --   --   --   --  11.0*  --   HCT 43.6  --   --   --   --  37.8  --   --   --   --   --  31.6*  --   PLT 159  --   --   --   --  135*  --   --   --   --   --  143*  --   APTT  --   --   --   --   --   --   --   --   --   --   --  54*  --   LABPROT 27.1*  --   --   --   --   --   --  40.0*  --   --  18.6* 19.4*  --   INR 2.55*  --   --   --   --   --   --  4.29*  --   --  1.54* 1.63*  --   HEPARINUNFRC  --   --   --   --  0.27*  --  0.49  --   --   --   --   --  0.54  CREATININE 3.57*  --   < > 3.32* 3.65* 3.53*  --   --  3.62* 3.75*  --  3.94*  --   TROPONINI  --  1.33*  --  1.28* 1.55*  --   --   --   --   --   --   --   --   < > = values in this interval not displayed.  Estimated Creatinine Clearance: 14.7 mL/min (by C-G formula based on Cr of 3.94).  Assessment: 69 yo F presents on 7/14 with weakness, diarrhea, and AMS. Most likely endocarditis with a need for emergent MVR  PMH: MVR on warfarin (goal 2.5 - 3.5), HTN, HLD  AC: Pt on coumadin PTA for MVR (4.5 mg daily). INR therapeutic at 2.55 on admit. INR down to 1.53 s/p 2 mg Vit K, so heparin restarted. Currently on 1100 units/hr. Now therapeutic x 2 with most recent HL 0.54  Goal of Therapy:  Heparin level 0.3-0.7 units/ml Monitor platelets by anticoagulation  protocol: Yes   Plan:  -Continue heparin at 1100 units/hr -Daily CBC, HL -Monitor for s/sx of bleeding  Isaac Bliss, PharmD, BCPS Clinical Pharmacist Pager [phone] 07/23/2014 12:37 PM

## 2014-07-23 NOTE — Progress Notes (Signed)
ANTIBIOTIC CONSULT NOTE - FOLLOW UP  Pharmacy Consult for vancomycin Indication: sepsis   Labs:  Recent Labs  07/21/14 1207  07/22/14 0400 07/22/14 0940 07/22/14 1425 07/23/14 0500  WBC 8.2  --  12.0*  --   --  16.2*  HGB 14.4  --  13.4  --   --  11.0*  PLT 159  --  135*  --   --  143*  CREATININE 3.57*  < > 3.53* 3.62* 3.75* 3.94*  < > = values in this interval not displayed. Estimated Creatinine Clearance: 14.7 mL/min (by C-G formula based on Cr of 3.94).  Recent Labs  07/23/14 0500  VANCORANDOM 10     Microbiology: Recent Results (from the past 720 hour(s))  Culture, blood (routine x 2)     Status: None (Preliminary result)   Collection Time: 07/21/14  2:00 PM  Result Value Ref Range Status   Specimen Description BLOOD LEFT HAND  Final   Special Requests BOTTLES DRAWN AEROBIC ONLY 2CC  Final   Culture NO GROWTH 1 DAY  Final   Report Status PENDING  Incomplete  MRSA PCR Screening     Status: None   Collection Time: 07/21/14  2:47 PM  Result Value Ref Range Status   MRSA by PCR NEGATIVE NEGATIVE Final    Comment:        The GeneXpert MRSA Assay (FDA approved for NASAL specimens only), is one component of a comprehensive MRSA colonization surveillance program. It is not intended to diagnose MRSA infection nor to guide or monitor treatment for MRSA infections.   Culture, blood (routine x 2)     Status: None (Preliminary result)   Collection Time: 07/21/14  5:21 PM  Result Value Ref Range Status   Specimen Description BLOOD LEFT HAND  Final   Special Requests BOTTLES DRAWN AEROBIC ONLY 2CC  Final   Culture NO GROWTH < 24 HOURS  Final   Report Status PENDING  Incomplete  Clostridium Difficile by PCR (not at [facility])     Status: None   Collection Time: 07/22/14  6:54 AM  Result Value Ref Range Status   C difficile by pcr NEGATIVE NEGATIVE Final     Assessment: 69yo female w/ random vanc level below goal 38hr after dose; pt in ARF but SCr has been fairly  consistent since admission.  Goal of Therapy:  Vancomycin trough level 15-20 mcg/ml  Plan:  Will begin vancomycin 1000mg  IV Q24H for calculated trough of ~16 and continue to monitor renal function carefully for further dose adjustments.  Vernard Gambles, PharmD, BCPS  07/23/2014,6:55 AM

## 2014-07-24 ENCOUNTER — Encounter (HOSPITAL_COMMUNITY)
Admission: EM | Disposition: A | Discharge status: Rehabilitation Facility | Payer: PRIVATE HEALTH INSURANCE | Source: Home / Self Care | Attending: Thoracic Surgery (Cardiothoracic Vascular Surgery)

## 2014-07-24 ENCOUNTER — Inpatient Hospital Stay (HOSPITAL_COMMUNITY): Payer: Medicare Other

## 2014-07-24 DIAGNOSIS — I340 Nonrheumatic mitral (valve) insufficiency: Secondary | ICD-10-CM

## 2014-07-24 DIAGNOSIS — R579 Shock, unspecified: Secondary | ICD-10-CM

## 2014-07-24 HISTORY — PX: CARDIAC CATHETERIZATION: SHX172

## 2014-07-24 LAB — RENAL FUNCTION PANEL
ANION GAP: 10 (ref 5–15)
Albumin: 2.6 g/dL — ABNORMAL LOW (ref 3.5–5.0)
BUN: 76 mg/dL — AB (ref 6–20)
CALCIUM: 9.7 mg/dL (ref 8.9–10.3)
CO2: 22 mmol/L (ref 22–32)
CREATININE: 3.85 mg/dL — AB (ref 0.44–1.00)
Chloride: 111 mmol/L (ref 101–111)
GFR calc Af Amer: 13 mL/min — ABNORMAL LOW (ref >60)
GFR calc non Af Amer: 11 mL/min — ABNORMAL LOW (ref >60)
GLUCOSE: 96 mg/dL (ref 65–99)
PHOSPHORUS: 3.4 mg/dL (ref 2.5–4.6)
Potassium: 3.5 mmol/L (ref 3.5–5.1)
Sodium: 143 mmol/L (ref 135–145)

## 2014-07-24 LAB — POCT I-STAT 3, VENOUS BLOOD GAS (G3P V)
ACID-BASE DEFICIT: 1 mmol/L (ref 0.0–2.0)
BICARBONATE: 23.4 meq/L (ref 20.0–24.0)
O2 Saturation: 62 %
TCO2: 25 mmol/L (ref 0–100)
pCO2, Ven: 39.2 mmHg — ABNORMAL LOW (ref 45.0–50.0)
pH, Ven: 7.385 — ABNORMAL HIGH (ref 7.250–7.300)
pO2, Ven: 32 mmHg (ref 30.0–45.0)

## 2014-07-24 LAB — CBC
HCT: 32.5 % — ABNORMAL LOW (ref 36.0–46.0)
Hemoglobin: 10.9 g/dL — ABNORMAL LOW (ref 12.0–15.0)
MCH: 28.7 pg (ref 26.0–34.0)
MCHC: 33.5 g/dL (ref 30.0–36.0)
MCV: 85.5 fL (ref 78.0–100.0)
Platelets: 139 10*3/uL — ABNORMAL LOW (ref 150–400)
RBC: 3.8 MIL/uL — ABNORMAL LOW (ref 3.87–5.11)
RDW: 13.3 % (ref 11.5–15.5)
WBC: 13.4 10*3/uL — ABNORMAL HIGH (ref 4.0–10.5)

## 2014-07-24 LAB — RESPIRATORY VIRUS PANEL
Adenovirus: NEGATIVE
Influenza A: NEGATIVE
Influenza B: NEGATIVE
METAPNEUMOVIRUS: NEGATIVE
Parainfluenza 1: NEGATIVE
Parainfluenza 2: NEGATIVE
Parainfluenza 3: NEGATIVE
RESPIRATORY SYNCYTIAL VIRUS A: NEGATIVE
Respiratory Syncytial Virus B: NEGATIVE
Rhinovirus: NEGATIVE

## 2014-07-24 LAB — GLUCOSE, CAPILLARY
GLUCOSE-CAPILLARY: 88 mg/dL (ref 65–99)
Glucose-Capillary: 103 mg/dL — ABNORMAL HIGH (ref 65–99)
Glucose-Capillary: 85 mg/dL (ref 65–99)
Glucose-Capillary: 86 mg/dL (ref 65–99)
Glucose-Capillary: 87 mg/dL (ref 65–99)
Glucose-Capillary: 90 mg/dL (ref 65–99)

## 2014-07-24 LAB — HEPATIC FUNCTION PANEL
ALT: 436 U/L — ABNORMAL HIGH (ref 14–54)
AST: 173 U/L — ABNORMAL HIGH (ref 15–41)
Albumin: 2.6 g/dL — ABNORMAL LOW (ref 3.5–5.0)
Alkaline Phosphatase: 55 U/L (ref 38–126)
BILIRUBIN DIRECT: 0.3 mg/dL (ref 0.1–0.5)
BILIRUBIN TOTAL: 1.6 mg/dL — AB (ref 0.3–1.2)
Indirect Bilirubin: 1.3 mg/dL — ABNORMAL HIGH (ref 0.3–0.9)
Total Protein: 5.5 g/dL — ABNORMAL LOW (ref 6.5–8.1)

## 2014-07-24 LAB — PROTIME-INR
INR: 1.51 — AB (ref 0.00–1.49)
Prothrombin Time: 18.3 seconds — ABNORMAL HIGH (ref 11.6–15.2)

## 2014-07-24 LAB — MAGNESIUM: Magnesium: 1.3 mg/dL — ABNORMAL LOW (ref 1.7–2.4)

## 2014-07-24 LAB — POCT ACTIVATED CLOTTING TIME: ACTIVATED CLOTTING TIME: 153 s

## 2014-07-24 LAB — HEPARIN LEVEL (UNFRACTIONATED)
HEPARIN UNFRACTIONATED: 0.8 [IU]/mL — AB (ref 0.30–0.70)
HEPARIN UNFRACTIONATED: 0.2 [IU]/mL — AB (ref 0.30–0.70)

## 2014-07-24 LAB — APTT: APTT: 174 s — AB (ref 24–37)

## 2014-07-24 SURGERY — RIGHT/LEFT HEART CATH AND CORONARY ANGIOGRAPHY
Anesthesia: Choice

## 2014-07-24 MED ORDER — NITROGLYCERIN IN D5W 200-5 MCG/ML-% IV SOLN
2.0000 ug/min | INTRAVENOUS | Status: DC
  Filled 2014-07-24: qty 250

## 2014-07-24 MED ORDER — CEFUROXIME SODIUM 750 MG IJ SOLR
750.0000 mg | INTRAMUSCULAR | Status: DC
  Filled 2014-07-24: qty 750

## 2014-07-24 MED ORDER — HEPARIN BOLUS VIA INFUSION
2000.0000 [IU] | Freq: Once | INTRAVENOUS | Status: AC
  Administered 2014-07-24: 2000 [IU] via INTRAVENOUS
  Filled 2014-07-24: qty 2000

## 2014-07-24 MED ORDER — EPINEPHRINE HCL 1 MG/ML IJ SOLN
0.0000 ug/min | INTRAMUSCULAR | Status: DC
  Filled 2014-07-24: qty 4

## 2014-07-24 MED ORDER — DEXMEDETOMIDINE HCL IN NACL 400 MCG/100ML IV SOLN
0.1000 ug/kg/h | INTRAVENOUS | Status: AC
  Administered 2014-07-25: .7 ug/kg/h via INTRAVENOUS
  Filled 2014-07-24: qty 100

## 2014-07-24 MED ORDER — MAGNESIUM SULFATE 50 % IJ SOLN
40.0000 meq | INTRAMUSCULAR | Status: DC
  Filled 2014-07-24: qty 10

## 2014-07-24 MED ORDER — DEXTROSE 5 % IV SOLN
30.0000 ug/min | INTRAVENOUS | Status: AC
  Administered 2014-07-25: 25 ug/min via INTRAVENOUS
  Filled 2014-07-24: qty 2

## 2014-07-24 MED ORDER — SODIUM CHLORIDE 0.9 % IV SOLN
INTRAVENOUS | Status: DC
  Administered 2014-07-24: 13:00:00 via INTRAVENOUS

## 2014-07-24 MED ORDER — SODIUM CHLORIDE 0.9 % IV SOLN: 250.0000 mL | INTRAVENOUS | Status: DC | PRN

## 2014-07-24 MED ORDER — IOHEXOL 350 MG/ML SOLN
INTRAVENOUS | Status: DC | PRN
  Administered 2014-07-24: 10 mL via INTRACARDIAC

## 2014-07-24 MED ORDER — SODIUM CHLORIDE 0.9 % IV SOLN
INTRAVENOUS | Status: AC
  Administered 2014-07-25: 1 [IU]/h via INTRAVENOUS
  Filled 2014-07-24: qty 2.5

## 2014-07-24 MED ORDER — DOPAMINE-DEXTROSE 3.2-5 MG/ML-% IV SOLN
0.0000 ug/kg/min | INTRAVENOUS | Status: AC
  Administered 2014-07-25: 3 ug/kg/min via INTRAVENOUS
  Filled 2014-07-24: qty 250

## 2014-07-24 MED ORDER — HEPARIN (PORCINE) IN NACL 100-0.45 UNIT/ML-% IJ SOLN
900.0000 [IU]/h | INTRAMUSCULAR | Status: DC
  Administered 2014-07-24: 800 [IU]/h via INTRAVENOUS
  Filled 2014-07-24: qty 250

## 2014-07-24 MED ORDER — SODIUM CHLORIDE 0.9 % IJ SOLN: 3.0000 mL | INTRAMUSCULAR | Status: DC | PRN

## 2014-07-24 MED ORDER — CHLORHEXIDINE GLUCONATE CLOTH 2 % EX PADS
6.0000 | MEDICATED_PAD | Freq: Once | CUTANEOUS | Status: AC
  Administered 2014-07-25: 6 via TOPICAL

## 2014-07-24 MED ORDER — LIDOCAINE HCL (PF) 1 % IJ SOLN
INTRAMUSCULAR | Status: DC | PRN
  Administered 2014-07-24: 12:00:00

## 2014-07-24 MED ORDER — SODIUM CHLORIDE 0.9 % IV SOLN: INTRAVENOUS | Status: DC

## 2014-07-24 MED ORDER — POTASSIUM CHLORIDE 2 MEQ/ML IV SOLN
80.0000 meq | INTRAVENOUS | Status: DC
  Filled 2014-07-24: qty 40

## 2014-07-24 MED ORDER — SODIUM CHLORIDE 0.9 % IV SOLN
INTRAVENOUS | Status: DC
  Filled 2014-07-24: qty 30

## 2014-07-24 MED ORDER — CHLORHEXIDINE GLUCONATE CLOTH 2 % EX PADS
6.0000 | MEDICATED_PAD | Freq: Once | CUTANEOUS | Status: AC
  Administered 2014-07-24: 6 via TOPICAL

## 2014-07-24 MED ORDER — DEXTROSE 5 % IV SOLN
1.5000 g | INTRAVENOUS | Status: AC
  Administered 2014-07-25: 1.5 g via INTRAVENOUS
  Administered 2014-07-25: .75 g via INTRAVENOUS
  Filled 2014-07-24: qty 1.5

## 2014-07-24 MED ORDER — VANCOMYCIN HCL 10 G IV SOLR
1250.0000 mg | INTRAVENOUS | Status: AC
  Administered 2014-07-25: 1250 mg via INTRAVENOUS
  Filled 2014-07-24: qty 1250

## 2014-07-24 MED ORDER — LIDOCAINE HCL (PF) 1 % IJ SOLN
INTRAMUSCULAR | Status: AC
  Filled 2014-07-24: qty 30

## 2014-07-24 MED ORDER — SODIUM CHLORIDE 0.9 % IV SOLN
INTRAVENOUS | Status: AC
  Administered 2014-07-25: 69 mL/h via INTRAVENOUS
  Filled 2014-07-24: qty 40

## 2014-07-24 MED ORDER — SODIUM CHLORIDE 0.9 % IV SOLN
INTRAVENOUS | Status: AC
  Administered 2014-07-25: 1000 mL
  Filled 2014-07-24: qty 1000

## 2014-07-24 MED ORDER — METOPROLOL TARTRATE 12.5 MG HALF TABLET
12.5000 mg | ORAL_TABLET | Freq: Once | ORAL | Status: AC
  Administered 2014-07-25: 12.5 mg via ORAL
  Filled 2014-07-24: qty 1

## 2014-07-24 MED ORDER — HEPARIN (PORCINE) IN NACL 2-0.9 UNIT/ML-% IJ SOLN
INTRAMUSCULAR | Status: AC
  Filled 2014-07-24: qty 1500

## 2014-07-24 MED ORDER — PLASMA-LYTE 148 IV SOLN
INTRAVENOUS | Status: DC
  Filled 2014-07-24: qty 2.5

## 2014-07-24 MED ORDER — SODIUM CHLORIDE 0.9 % IJ SOLN
3.0000 mL | Freq: Two times a day (BID) | INTRAMUSCULAR | Status: DC
  Administered 2014-07-24 (×2): 3 mL via INTRAVENOUS

## 2014-07-24 SURGICAL SUPPLY — 11 items
CATH INFINITI MULTIPACK ST 5F (CATHETERS) ×3 IMPLANT
CATH SWAN GANZ 7F STRAIGHT (CATHETERS) ×3 IMPLANT
DEVICE RAD COMP TR BAND LRG (VASCULAR PRODUCTS) ×3 IMPLANT
KIT HEART LEFT (KITS) ×3 IMPLANT
KIT HEART RIGHT NAMIC (KITS) ×3 IMPLANT
PACK CARDIAC CATHETERIZATION (CUSTOM PROCEDURE TRAY) ×3 IMPLANT
SHEATH PINNACLE 5F 10CM (SHEATH) ×3 IMPLANT
SHEATH PINNACLE 7F 10CM (SHEATH) ×3 IMPLANT
SYR MEDRAD MARK V 150ML (SYRINGE) IMPLANT
TRANSDUCER W/STOPCOCK (MISCELLANEOUS) ×6 IMPLANT
WIRE EMERALD 3MM-J .035X150CM (WIRE) ×3 IMPLANT

## 2014-07-24 NOTE — Plan of Care (Signed)
Problem: Phase I Progression Outcomes Goal: VTE prophylaxis Outcome: Completed/Met Date Met:  07/24/14 Heparin IV, SCDs

## 2014-07-24 NOTE — Progress Notes (Signed)
ANTICOAGULATION CONSULT NOTE - Follow Up Consult  Pharmacy Consult for heparin Indication: MVR   Labs:  Recent Labs  07/21/14 1436  07/21/14 2036  07/22/14 0130 07/22/14 0400 07/22/14 0500  07/22/14 1425 07/23/14 0142 07/23/14 0500 07/23/14 1129 07/24/14 0438  HGB  --   --   --   --   --  13.4  --   --   --   --  11.0*  --  10.9*  HCT  --   --   --   --   --  37.8  --   --   --   --  31.6*  --  32.5*  PLT  --   --   --   --   --  135*  --   --   --   --  143*  --  139*  APTT  --   --   --   --   --   --   --   --   --   --  54*  --  174*  LABPROT  --   --   --   --   --   --   --   < >  --  18.6* 19.4*  --  18.3*  INR  --   --   --   --   --   --   --   < >  --  1.54* 1.63*  --  1.51*  HEPARINUNFRC  --   --   --   < > 0.27*  --  0.49  --   --   --   --  0.54 0.80*  CREATININE  --   < > 3.32*  --  3.65* 3.53*  --   < > 3.75*  --  3.94*  --  3.85*  TROPONINI 1.33*  --  1.28*  --  1.55*  --   --   --   --   --   --   --   --   < > = values in this interval not displayed.    Assessment: 69yo female now supratherapeutic on heparin after one level at goal after resumed.  Goal of Therapy:  Heparin level 0.3-0.7 units/ml   Plan:  Will decrease heparin gtt slightly to 1000 units/hr and check level in 8hr.  Vernard Gambles, PharmD, BCPS  07/24/2014,6:32 AM

## 2014-07-24 NOTE — H&P (View-Only) (Signed)
SUBJECTIVE:  Stable. Intubated.  Able to respond.  No complaints.  Awaiting cath.  OBJECTIVE:   Vitals:   Filed Vitals:   07/24/14 0600 07/24/14 0700 07/24/14 0800 07/24/14 0809  BP: 101/75 96/70 83/65    Pulse:  79    Temp:    97.9 F (36.6 C)  TempSrc:    Oral  Resp: 20 20 19    Height:      Weight:      SpO2:  100%     I&O's:    Intake/Output Summary (Last 24 hours) at 07/24/14 0954 Last data filed at 07/24/14 0800  Gross per 24 hour  Intake 1862.16 ml  Output    350 ml  Net 1512.16 ml   TELEMETRY: Reviewed telemetry pt in NSR :     PHYSICAL EXAM General: Well developed, well nourished, in no acute distress Head:   Normal cephalic and atramatic  Lungs:  Coarse BS bilaterally Heart:   HRRR S1 S2  No JVD.   Abdomen: abdomen soft and non-tender Msk:  Back normal,  Normal strength and tone for age. Extremities:  Tr leg edema. 1+ right radial pulse  Neuro: Alert and oriented. Psych:  Normal affect, responds appropriately Skin: No rash   LABS: Basic Metabolic Panel:  Recent Labs  27/06/23 0500 07/24/14 0438  NA 143 143  K 3.0* 3.5  CL 109 111  CO2 22 22  GLUCOSE 117* 96  BUN 70* 76*  CREATININE 3.94* 3.85*  CALCIUM 10.0 9.7  MG 1.2* 1.3*  PHOS 3.1 3.4   Liver Function Tests:  Recent Labs  07/23/14 0500 07/24/14 0438  AST 415* 173*  ALT 672* 436*  ALKPHOS 60 55  BILITOT 1.6* 1.6*  PROT 6.0* 5.5*  ALBUMIN 2.8*  2.8* 2.6*  2.6*   No results for input(s): LIPASE, AMYLASE in the last 72 hours. CBC:  Recent Labs  07/23/14 0500 07/24/14 0438  WBC 16.2* 13.4*  HGB 11.0* 10.9*  HCT 31.6* 32.5*  MCV 83.8 85.5  PLT 143* 139*   Cardiac Enzymes:  Recent Labs  07/21/14 1436 07/21/14 2036 07/22/14 0130  TROPONINI 1.33* 1.28* 1.55*   BNP: Invalid input(s): POCBNP D-Dimer: No results for input(s): DDIMER in the last 72 hours. Hemoglobin A1C: No results for input(s): HGBA1C in the last 72 hours. Fasting Lipid Panel: No results for  input(s): CHOL, HDL, LDLCALC, TRIG, CHOLHDL, LDLDIRECT in the last 72 hours. Thyroid Function Tests: No results for input(s): TSH, T4TOTAL, T3FREE, THYROIDAB in the last 72 hours.  Invalid input(s): FREET3 Anemia Panel: No results for input(s): VITAMINB12, FOLATE, FERRITIN, TIBC, IRON, RETICCTPCT in the last 72 hours. Coag Panel:   Lab Results  Component Value Date   INR 1.51* 07/24/2014   INR 1.63* 07/23/2014   INR 1.54* 07/23/2014    RADIOLOGY: US Renal  07/23/2014   CLINICAL DATA:  Acute renal failure  EXAM: RENAL / URINARY TRACT ULTRASOUND COMPLETE  COMPARISON:  None.  FINDINGS: Right Kidney:  Length: 9.7 cm. Possible 14 x 8 x 12 mm echogenic lesion in the interpolar region. No hydronephrosis.  Left Kidney:  Length: 10.8 cm.  No mass or hydronephrosis.  Bladder:  Decompressed by indwelling Foley catheter.  IMPRESSION: No hydronephrosis.  Possible 14 mm echogenic lesion in the interpolar right kidney, poorly evaluated. Consider follow-up CT or MR for further characterization, preferably with/without contrast after renal function improves.  Bladder is decompressed with indwelling Foley catheter.   Electronically Signed   By: Roselie Awkward.D.  On: 07/23/2014 18:47   Dg Chest Port 1 View  07/24/2014   CLINICAL DATA:  69 year old female with acute respiratory failure  EXAM: PORTABLE CHEST - 1 VIEW  COMPARISON:  Prior chest x-ray 07/23/2014  FINDINGS: Patient is intubated. The tip of the ET tube is 2.9 cm above the carina. The nasogastric tube has withdrawn. The tip now overlies the mid to lower esophagus. Stable position of left IJ approach central venous catheter. Catheter tip in good position at the superior cavoatrial junction. Patient is status post median sternotomy with evidence of cardiac valve replacement. Persistent low inspiratory volumes with bilateral effusions and associated atelectasis. Nodular opacity in the right mid lung again noted. No pneumothorax. No acute osseous  abnormality.  IMPRESSION: 1. The nasogastric tube has pulled back. The tip of the tube is now on the mid esophagus. 2. Other support apparatus remain in stable and satisfactory position. 3. Persistent low inspiratory volumes with bibasilar atelectasis and small effusions. 4. Persistent nodular appearance of opacity in the right mid to upper lung. While this may reflect an incidental nodular appearance of residual atelectasis/infiltrate, a true pulmonary nodule is not excluded. If this abnormality does not resolve on additional follow-up radiographs, chest CT may be ultimately warranted for further evaluation. These results of #1 above were called by telephone at the time of interpretation on 07/24/2014 at 8:45 am to the patient's nurse, Paticia Stack, who verbally acknowledged these results.   Electronically Signed   By: Malachy Moan M.D.   On: 07/24/2014 08:46   Dg Chest Port 1 View  07/23/2014   CLINICAL DATA:  CHF.  EXAM: PORTABLE CHEST - 1 VIEW  COMPARISON:  07/22/2014  FINDINGS: Endotracheal tube is in place, unchanged in position. Left IJ central line tip overlies the level of superior vena cava. Nasogastric tube tip is beyond the gastroesophageal junction, tip off the film.  There are persistent bilateral lung opacities which obscure the hemidiaphragms. Bilateral pleural effusions are present. Perihilar infiltrates appear stable.  IMPRESSION: Persistent bilateral infiltrates and bilateral pleural effusions.   Electronically Signed   By: Norva Pavlov M.D.   On: 07/23/2014 07:38   Dg Chest Port 1 View  07/22/2014   CLINICAL DATA:  Acute respiratory failure. On ventilator. Septic shock. Cardiac arrest.  EXAM: PORTABLE CHEST - 1 VIEW  COMPARISON:  07/21/2014  FINDINGS: Support lines and tubes in appropriate position. Symmetric bilateral mid and lower lung airspace disease and small to moderate bilateral pleural effusion show no significant change. Heart size is stable.  IMPRESSION: Symmetric airspace  disease and bilateral pleural effusions, without significant change.   Electronically Signed   By: Myles Rosenthal M.D.   On: 07/22/2014 07:12   Dg Chest Port 1 View  07/21/2014   CLINICAL DATA:  Respiratory failure  EXAM: PORTABLE CHEST - 1 VIEW  COMPARISON:  07/21/2014  FINDINGS: Cardiac shadow is stable. Postsurgical changes are again seen. An endotracheal tube is again identified 3.5 cm above the carina. A left jugular central line is noted in satisfactory position as is a nasogastric catheter. Bilateral pleural effusions are seen increased from the prior exam. Bibasilar infiltrative changes are noted as well. Stable changes in the left apex are seen.  IMPRESSION: Stable patchy opacities are noted within the bases as well as the left upper lobe. Increasing bilateral pleural effusions are seen.  Tubes and lines as described.   Electronically Signed   By: Alcide Clever M.D.   On: 07/21/2014 17:22   Dg Chest Portable  1 View  07/21/2014   CLINICAL DATA:  Cardiac arrest.  Hypoxia.  EXAM: PORTABLE CHEST - 1 VIEW  COMPARISON:  January 13, 2007  FINDINGS: The endotracheal tube tip is at the level of T1, approximately 11 cm above the carina. Nasogastric tube tip is in the stomach with the side port at the gastroesophageal junction. No pneumothorax. There is interstitial and patchy alveolar edema throughout the lungs bilaterally, slightly more on the right than on the left. Heart is enlarged with pulmonary venous hypertension. Patient is status post mitral valve replacement.  IMPRESSION: Tube positions as described without pneumothorax. Note that both the endotracheal tube and nasogastric tube may warrant advancing. There is evidence of underlying congestive heart failure.   Electronically Signed   By: Bretta Bang III M.D.   On: 07/21/2014 13:56   Dg Chest Port 1v Same Day  07/21/2014   CLINICAL DATA:  Central line placement  EXAM: PORTABLE CHEST - 1 VIEW SAME DAY  COMPARISON:  07/21/2014  FINDINGS:  Cardiomediastinal silhouette is stable. Persistent interstitial prominence bilateral consistent with mild pulmonary edema with slight improvement in aeration. Endotracheal tube again noted with tip at the level of T1 vertebral body. NG tube in place. There is left IJ central line with tip in SVC. No pneumothorax.  IMPRESSION: Persistent mild interstitial prominence bilateral consistent with pulmonary edema with slight improvement in aeration. Again noted endotracheal tube with tip at T1 level. Stable NG tube position. Left IJ central line with tip in SVC. No pneumothorax.   Electronically Signed   By: Natasha Mead M.D.   On: 07/21/2014 15:26      ASSESSMENT: [name]:    I discussed the case with Dr. Kirke Corin today.  Prosthetic MV not functioning. Will need replacement. COmplicated by shock and ARF.  Appreciate renal consult by Dr. Arrie Aran.  High likelihood she will need dialysis at some point. Stable on IV fluid to help renal function.  Cr slightly lower today.  She will get cath today.  Orders placed.  Consent obtained.   IV heparin for now.  Pressors have been needed for BP in the past but now off pressors.   Vent per CCM.   Spoke to patient's daughter.  Corky Crafts, MD  07/24/2014  9:54 AM

## 2014-07-24 NOTE — Progress Notes (Addendum)
ANTICOAGULATION CONSULT NOTE - Follow Up Consult  Pharmacy Consult for heparin Indication: MVR   Labs:  Recent Labs  07/22/14 0130  07/22/14 0400  07/22/14 1425 07/23/14 0142 07/23/14 0500 07/23/14 1129 07/24/14 0438 07/24/14 2220  HGB  --   < > 13.4  --   --   --  11.0*  --  10.9*  --   HCT  --   --  37.8  --   --   --  31.6*  --  32.5*  --   PLT  --   --  135*  --   --   --  143*  --  139*  --   APTT  --   --   --   --   --   --  54*  --  174*  --   LABPROT  --   --   --   < >  --  18.6* 19.4*  --  18.3*  --   INR  --   --   --   < >  --  1.54* 1.63*  --  1.51*  --   HEPARINUNFRC 0.27*  --   --   < >  --   --   --  0.54 0.80* 0.20*  CREATININE 3.65*  --  3.53*  < > 3.75*  --  3.94*  --  3.85*  --   TROPONINI 1.55*  --   --   --   --   --   --   --   --   --   < > = values in this interval not displayed.    Assessment: 69yo female subtherapeutic on heparin after resumed at lower rate; noted that pt is tentatively going to OR in am for MVR redo.  Goal of Therapy:  Heparin level 0.3-0.7 units/ml   Plan:  Will increase heparin gtt slightly to 900 units/hr and check level in 8hr vs f/u after surgery.  Vernard Gambles, PharmD, BCPS  07/24/2014,10:58 PM

## 2014-07-24 NOTE — Progress Notes (Signed)
Pt's K was 3.0 at 0500. It was not replaced. Pt does have and elevated creatinine. Notified MD Sommers in regards to this and ordered to continue to monitor and assess.

## 2014-07-24 NOTE — Progress Notes (Signed)
RT Note: Pt trasported to cath lab & back to unit with no complications. RT to monitor.

## 2014-07-24 NOTE — Progress Notes (Signed)
Patient ID: [name], female   DOB: 1945/07/04, 69 y.o.   MRN: [id number] S:no complaints and understands treatment plan O:BP 101/75 mmHg  Pulse 97  Temp(Src) 97.9 F (36.6 C) (Oral)  Resp 20  Ht 5\' 6"  (1.676 m)  Wt 82.3 kg (181 lb 7 oz)  BMI 29.30 kg/m2  SpO2 100%  Intake/Output Summary (Last 24 hours) at 07/24/14 0852 Last data filed at 07/24/14 0600  Gross per 24 hour  Intake 1711.94 ml  Output    315 ml  Net 1396.94 ml   Intake/Output: I/O last 3 completed shifts: In: 3857.2 [I.V.:2121.5; Blood:1285.7; IV Piggyback:450] Out: 520 [Urine:520]  Intake/Output this shift:    Weight change: 5.5 kg (12 lb 2 oz) Gen:WD WN AAF intubated but awake/alert CVS:no rub Resp:scattered rhonchi WUJ:WJXBJY NWG:NFAOZ edema of upper extremities   Recent Labs Lab 07/21/14 1207 07/21/14 1436  07/21/14 2036 07/22/14 0130 07/22/14 0400 07/22/14 0940 07/22/14 1425 07/23/14 0500 07/24/14 0438  NA 139  --   < > 142 141 142 144 144 143 143  K 5.1  --   < > 4.1 3.6 3.5 3.1* 2.9* 3.0* 3.5  CL 112*  --   < > 113* 112* 114* 113* 109 109 111  CO2 11*  --   < > 13* 16* 18* 21* 23 22 22   GLUCOSE 179*  --   < > 214* 176* 186* 127* 122* 117* 96  BUN 57*  --   < > 58* 61* 62* 62* 63* 70* 76*  CREATININE 3.57*  --   < > 3.32* 3.65* 3.53* 3.62* 3.75* 3.94* 3.85*  ALBUMIN 3.0*  --   --   --   --   --   --   --  2.8*  2.8* 2.6*  2.6*  CALCIUM 10.8*  --   < > 8.0* 8.6* 8.8* 8.8* 9.1 10.0 9.7  PHOS  --  5.9*  --   --   --  1.4*  --   --  3.1 3.4  AST 72*  --   --   --   --   --   --   --  415* 173*  ALT 49  --   --   --   --   --   --   --  672* 436*  < > = values in this interval not displayed. Liver Function Tests:  Recent Labs Lab 07/21/14 1207 07/23/14 0500 07/24/14 0438  AST 72* 415* 173*  ALT 49 672* 436*  ALKPHOS 56 60 55  BILITOT 2.4* 1.6* 1.6*  PROT 6.7 6.0* 5.5*  ALBUMIN 3.0* 2.8*  2.8* 2.6*  2.6*   No results for input(s): LIPASE, AMYLASE in the last 168 hours. No  results for input(s): AMMONIA in the last 168 hours. CBC:  Recent Labs Lab 07/21/14 1207 07/22/14 0400 07/23/14 0500 07/24/14 0438  WBC 8.2 12.0* 16.2* 13.4*  HGB 14.4 13.4 11.0* 10.9*  HCT 43.6 37.8 31.6* 32.5*  MCV 87.2 82.2 83.8 85.5  PLT 159 135* 143* 139*   Cardiac Enzymes:  Recent Labs Lab 07/21/14 1436 07/21/14 2036 07/22/14 0130  TROPONINI 1.33* 1.28* 1.55*   CBG:  Recent Labs Lab 07/23/14 1201 07/23/14 1554 07/23/14 2020 07/24/14 0029 07/24/14 0348  GLUCAP 98 105* 106* 85 90    Iron Studies: No results for input(s): IRON, TIBC, TRANSFERRIN, FERRITIN in the last 72 hours. Studies/Results: US Renal  07/23/2014   CLINICAL DATA:  Acute renal failure  EXAM:  RENAL / URINARY TRACT ULTRASOUND COMPLETE  COMPARISON:  None.  FINDINGS: Right Kidney:  Length: 9.7 cm. Possible 14 x 8 x 12 mm echogenic lesion in the interpolar region. No hydronephrosis.  Left Kidney:  Length: 10.8 cm.  No mass or hydronephrosis.  Bladder:  Decompressed by indwelling Foley catheter.  IMPRESSION: No hydronephrosis.  Possible 14 mm echogenic lesion in the interpolar right kidney, poorly evaluated. Consider follow-up CT or MR for further characterization, preferably with/without contrast after renal function improves.  Bladder is decompressed with indwelling Foley catheter.   Electronically Signed   By: Charline Bills M.D.   On: 07/23/2014 18:47   Dg Chest Port 1 View  07/24/2014   CLINICAL DATA:  70 year old female with acute respiratory failure  EXAM: PORTABLE CHEST - 1 VIEW  COMPARISON:  Prior chest x-ray 07/23/2014  FINDINGS: Patient is intubated. The tip of the ET tube is 2.9 cm above the carina. The nasogastric tube has withdrawn. The tip now overlies the mid to lower esophagus. Stable position of left IJ approach central venous catheter. Catheter tip in good position at the superior cavoatrial junction. Patient is status post median sternotomy with evidence of cardiac valve replacement.  Persistent low inspiratory volumes with bilateral effusions and associated atelectasis. Nodular opacity in the right mid lung again noted. No pneumothorax. No acute osseous abnormality.  IMPRESSION: 1. The nasogastric tube has pulled back. The tip of the tube is now on the mid esophagus. 2. Other support apparatus remain in stable and satisfactory position. 3. Persistent low inspiratory volumes with bibasilar atelectasis and small effusions. 4. Persistent nodular appearance of opacity in the right mid to upper lung. While this may reflect an incidental nodular appearance of residual atelectasis/infiltrate, a true pulmonary nodule is not excluded. If this abnormality does not resolve on additional follow-up radiographs, chest CT may be ultimately warranted for further evaluation. These results of #1 above were called by telephone at the time of interpretation on 07/24/2014 at 8:45 am to the patient's nurse, Paticia Stack, who verbally acknowledged these results.   Electronically Signed   By: Malachy Moan M.D.   On: 07/24/2014 08:46   Dg Chest Port 1 View  07/23/2014   CLINICAL DATA:  CHF.  EXAM: PORTABLE CHEST - 1 VIEW  COMPARISON:  07/22/2014  FINDINGS: Endotracheal tube is in place, unchanged in position. Left IJ central line tip overlies the level of superior vena cava. Nasogastric tube tip is beyond the gastroesophageal junction, tip off the film.  There are persistent bilateral lung opacities which obscure the hemidiaphragms. Bilateral pleural effusions are present. Perihilar infiltrates appear stable.  IMPRESSION: Persistent bilateral infiltrates and bilateral pleural effusions.   Electronically Signed   By: Norva Pavlov M.D.   On: 07/23/2014 07:38   . sodium chloride   Intravenous Once  . antiseptic oral rinse  7 mL Mouth Rinse QID  . chlorhexidine  15 mL Mouth Rinse BID  . famotidine  20 mg Per Tube Daily  . insulin aspart  2-6 Units Subcutaneous 6 times per day  . sodium chloride  3 mL Intravenous  Q12H    BMET    Component Value Date/Time   NA 143 07/24/2014 0438   K 3.5 07/24/2014 0438   CL 111 07/24/2014 0438   CO2 22 07/24/2014 0438   GLUCOSE 96 07/24/2014 0438   BUN 76* 07/24/2014 0438   CREATININE 3.85* 07/24/2014 0438   CREATININE 1.48* 02/17/2013 1205   CALCIUM 9.7 07/24/2014 0438   GFRNONAA 11*  07/24/2014 0438   GFRAA 13* 07/24/2014 0438   CBC    Component Value Date/Time   WBC 13.4* 07/24/2014 0438   RBC 3.80* 07/24/2014 0438   HGB 10.9* 07/24/2014 0438   HCT 32.5* 07/24/2014 0438   PLT 139* 07/24/2014 0438   MCV 85.5 07/24/2014 0438   MCH 28.7 07/24/2014 0438   MCHC 33.5 07/24/2014 0438   RDW 13.3 07/24/2014 0438   LYMPHSABS 1.6 01/12/2007 1430   MONOABS 0.4 01/12/2007 1430   EOSABS 0.1 01/12/2007 1430   BASOSABS 0.0 01/12/2007 1430     Assessment/Plan: 1. AKI/CKD- oliguric. Unclear baseline Scr. Has h/o kidney disease as a child but had normal Scr in 2009. Likely related to acute decompensated CHF related to cardiogenic shock and valvular dysfunction in setting of Ace-Inhibitor 1. Cont to hold ACE 2. FeNa is low and is on IVF's (likely due to poor forward flow from valvular dysfunction) and slight improvement.   3. Renal US without obstruction 4. Also discussed risks of IV contrast with cardiac cath however she will need urgent repair of MVR and is aware of risks and willing to proceed. Just ask to limit contrast for procedure and hydrate pre and post cath.  5. No indication for dialysis and will cont to follow as well as try to obtain outpt records to give [place] a better idea of her baseline Scr but may require CVVHD in near future if she remains oliguric and on pressors. 6. Continue with IVF's for now as she is starting to improve and is preload dependent 2. Cardiogenic shock due to acute onset mitral stenosis due to prosthetic valve dysfunction 1. For cardiac cath today and MVR (redo) per Dr. Cornelius Moras 07/25/14. 2. Continue with pressors for now and  supportive care 3. H/o MVR now with mitral stenosis - CT surgery following and await cardiac cath results before proceeding with MVR. 4. Metabolic acidosis due to #1 improving with isotonic bicarb gtt. 5. SIRS- on pressors 6. VDRF- per PCCM 7. Abnormal LFT's- likely related to #2 and #3. improving.  Cont to follow.  8. Hypokalemia- replete and follow  Adarryl Goldammer A

## 2014-07-24 NOTE — Progress Notes (Addendum)
[address]       [place] [address]             [phone]        CARDIOTHORACIC SURGERY PROGRESS NOTE  Subjective: Awake and alert on vent.  Comfortable.  Objective: Vital signs: BP Readings from Last 1 Encounters:  07/24/14 83/65   Pulse Readings from Last 1 Encounters:  07/24/14 79   Resp Readings from Last 1 Encounters:  07/24/14 19   Temp Readings from Last 1 Encounters:  07/24/14 97.9 F (36.6 C) Oral    Hemodynamics:    Physical Exam:  Rhythm:   sinus  Breath sounds: Scattered rhonchi o/w clear  Heart sounds:  RRR  Incisions:  n/a  Abdomen:  soft  Extremities:  Warm, well-perfused    Intake/Output from previous day: 07/16 0701 - 07/17 0700 In: 2197.1 [I.V.:1947.1; IV Piggyback:250] Out: 355 [Urine:355] Intake/Output this shift: Total I/O In: 171.2 [I.V.:171.2] Out: 35 [Urine:35]  Lab Results:  CBC: Recent Labs  07/23/14 0500 07/24/14 0438  WBC 16.2* 13.4*  HGB 11.0* 10.9*  HCT 31.6* 32.5*  PLT 143* 139*    BMET:  Recent Labs  07/23/14 0500 07/24/14 0438  NA 143 143  K 3.0* 3.5  CL 109 111  CO2 22 22  GLUCOSE 117* 96  BUN 70* 76*  CREATININE 3.94* 3.85*  CALCIUM 10.0 9.7     PT/INR:   Recent Labs  07/24/14 0438  LABPROT 18.3*  INR 1.51*    CBG (last 3)   Recent Labs  07/24/14 0029 07/24/14 0348 07/24/14 0810  GLUCAP 85 90 103*    ABG    Component Value Date/Time   PHART 7.599* 07/22/2014 0503   PCO2ART 19.6* 07/22/2014 0503   PO2ART 150.0* 07/22/2014 0503   HCO3 19.2* 07/22/2014 0503   TCO2 20 07/22/2014 0503   ACIDBASEDEF 1.0 07/22/2014 0503   O2SAT 100.0 07/22/2014 0503    CXR: PORTABLE CHEST - 1 VIEW  COMPARISON: Prior chest x-ray 07/23/2014  FINDINGS: Patient is intubated. The tip of the ET tube is 2.9 cm above the carina. The nasogastric tube has withdrawn. The tip now overlies the mid to lower esophagus. Stable position of left IJ approach central venous  catheter. Catheter tip in good position at the superior cavoatrial junction. Patient is status post median sternotomy with evidence of cardiac valve replacement. Persistent low inspiratory volumes with bilateral effusions and associated atelectasis. Nodular opacity in the right mid lung again noted. No pneumothorax. No acute osseous abnormality.  IMPRESSION: 1. The nasogastric tube has pulled back. The tip of the tube is now on the mid esophagus. 2. Other support apparatus remain in stable and satisfactory position. 3. Persistent low inspiratory volumes with bibasilar atelectasis and small effusions. 4. Persistent nodular appearance of opacity in the right mid to upper lung. While this may reflect an incidental nodular appearance of residual atelectasis/infiltrate, a true pulmonary nodule is not excluded. If this abnormality does not resolve on additional follow-up radiographs, chest CT may be ultimately warranted for further evaluation. These results of #1 above were called by telephone at the time of interpretation on 07/24/2014 at 8:45 am to the patient's nurse, Paticia Stack, who verbally acknowledged these results.   Electronically Signed  By: Malachy Moan M.D.  On: 07/24/2014 08:46   Assessment/Plan:  Clinically stable Maintaining NSR w/ stable BP, shock resolved Renal function stable, perhaps slightly improved Awaiting cath Tentatively for OR tomorrow for redo MVR  The  patient and her family were again counseled at length regarding the indications, risks and potential benefits of redo mitral valve replacement.  We discussed the possibility of replacing the mitral valve using another mechanical prosthesis with the attendant need for long-term anticoagulation versus the alternative of replacing it using a bioprosthetic tissue valve with its potential for late structural valve deterioration and failure, depending upon the patient's longevity.  The patient specifically  requests that if the mitral valve must be replaced that it be done using another mechanical valve.   Given concerns regarding the patient's renal function and the significant possibility that she might develop renal failure now or in the future, I think it makes sense to use a mechanical prosthesis because of the decreased durability of bioprosthetic tissue valves in the setting of ESRD.  The patient understands and accepts all potential risks of surgery including but not limited to risk of death, stroke or other neurologic complication, myocardial infarction, congestive heart failure, respiratory failure, renal failure, bleeding requiring transfusion and/or reexploration, arrhythmia, infection or other wound complications, pneumonia, pleural and/or pericardial effusion, pulmonary embolus, aortic dissection or other major vascular complication, or delayed complications related to valve repair or replacement including but not limited to structural valve deterioration and failure, thrombosis, embolization, endocarditis, or paravalvular leak.  All of their questions have been answered.   I spent in excess of 20 minutes during the conduct of this hospital encounter and >50% of this time involved direct face-to-face encounter with the patient for counseling and/or coordination of their care.    Purcell Nails 07/24/2014 10:06 AM

## 2014-07-24 NOTE — Interval H&P Note (Signed)
History and Physical Interval Note:  07/24/2014 10:55 AM  [name]  has presented today for surgery, with the diagnosis of Mitral valve disease  The various methods of treatment have been discussed with the patient and family. After consideration of risks, benefits and other options for treatment, the patient has consented to  Procedure(s): Right/Left Heart Cath and Coronary Angiography (N/A) as a surgical intervention .  The patient's history has been reviewed, patient examined, no change in status, stable for surgery.  I have reviewed the patient's chart and labs.  Questions were answered to the patient's satisfaction.     Lorine Bears

## 2014-07-24 NOTE — Progress Notes (Signed)
ANTICOAGULATION CONSULT NOTE - Follow Up Consult  Pharmacy Consult for heparin Indication: MVR  Allergies  Allergen Reactions  . Augmentin [Amoxicillin-Pot Clavulanate]     Patient Measurements: Height: 5\' 6"  (167.6 cm) Weight: 181 lb 7 oz (82.3 kg) IBW/kg (Calculated) : 59.3  Vital Signs: Temp: 97.9 F (36.6 C) (07/17 0809) Temp Source: Oral (07/17 0809) BP: 118/89 mmHg (07/17 1149) Pulse Rate: 86 (07/17 1149)  Labs:  Recent Labs  07/21/14 1436  07/21/14 2036  07/22/14 0130  07/22/14 0400 07/22/14 0500  07/22/14 1425 07/23/14 0142 07/23/14 0500 07/23/14 1129 07/24/14 0438  HGB  --   --   --   --   --   < > 13.4  --   --   --   --  11.0*  --  10.9*  HCT  --   --   --   --   --   --  37.8  --   --   --   --  31.6*  --  32.5*  PLT  --   --   --   --   --   --  135*  --   --   --   --  143*  --  139*  APTT  --   --   --   --   --   --   --   --   --   --   --  54*  --  174*  LABPROT  --   --   --   --   --   --   --   --   < >  --  18.6* 19.4*  --  18.3*  INR  --   --   --   --   --   --   --   --   < >  --  1.54* 1.63*  --  1.51*  HEPARINUNFRC  --   --   --   < > 0.27*  --   --  0.49  --   --   --   --  0.54 0.80*  CREATININE  --   < > 3.32*  --  3.65*  --  3.53*  --   < > 3.75*  --  3.94*  --  3.85*  TROPONINI 1.33*  --  1.28*  --  1.55*  --   --   --   --   --   --   --   --   --   < > = values in this interval not displayed.  Estimated Creatinine Clearance: 15.5 mL/min (by C-G formula based on Cr of 3.85).  Assessment: 69 yo F presents on 7/14 with weakness, diarrhea, and AMS. Most likely endocarditis with a need for emergent MVR  PMH: MVR on warfarin (goal 2.5 - 3.5), HTN, HLD  AC: Pt on coumadin PTA for MVR (4.5 mg daily). INR therapeutic at 2.55 on admit. INR down to 1.53 s/p 2 mg Vit K, so heparin restarted. Currently on 1000 units/hr after decrease with HL of 0.8. Pt went to cath this am. Heparin off to resume at 4pm  Goal of Therapy:  Heparin level  0.3-0.7 units/ml Monitor platelets by anticoagulation protocol: Yes   Plan:  -Heparin bolus 2000 units at 4p followed by a drip of 800 units/hr (will initiate lower rate since pt had been supratherapeutic on previous dose) -2200 HL -Daily CBC, HL  Isaac Bliss, PharmD, BCPS  Clinical Pharmacist Pager [phone] 07/24/2014 12:07 PM

## 2014-07-24 NOTE — Progress Notes (Addendum)
SUBJECTIVE:  Stable. Intubated.  Able to respond.  No complaints.  Awaiting cath.  OBJECTIVE:   Vitals:   Filed Vitals:   07/24/14 0600 07/24/14 0700 07/24/14 0800 07/24/14 0809  BP: 101/75 96/70 83/65    Pulse:  79    Temp:    97.9 F (36.6 C)  TempSrc:    Oral  Resp: 20 20 19    Height:      Weight:      SpO2:  100%     I&O's:    Intake/Output Summary (Last 24 hours) at 07/24/14 0954 Last data filed at 07/24/14 0800  Gross per 24 hour  Intake 1862.16 ml  Output    350 ml  Net 1512.16 ml   TELEMETRY: Reviewed telemetry pt in NSR :     PHYSICAL EXAM General: Well developed, well nourished, in no acute distress Head:   Normal cephalic and atramatic  Lungs:  Coarse BS bilaterally Heart:   HRRR S1 S2  No JVD.   Abdomen: abdomen soft and non-tender Msk:  Back normal,  Normal strength and tone for age. Extremities:  Tr leg edema. 1+ right radial pulse  Neuro: Alert and oriented. Psych:  Normal affect, responds appropriately Skin: No rash   LABS: Basic Metabolic Panel:  Recent Labs  27/06/23 0500 07/24/14 0438  NA 143 143  K 3.0* 3.5  CL 109 111  CO2 22 22  GLUCOSE 117* 96  BUN 70* 76*  CREATININE 3.94* 3.85*  CALCIUM 10.0 9.7  MG 1.2* 1.3*  PHOS 3.1 3.4   Liver Function Tests:  Recent Labs  07/23/14 0500 07/24/14 0438  AST 415* 173*  ALT 672* 436*  ALKPHOS 60 55  BILITOT 1.6* 1.6*  PROT 6.0* 5.5*  ALBUMIN 2.8*  2.8* 2.6*  2.6*   No results for input(s): LIPASE, AMYLASE in the last 72 hours. CBC:  Recent Labs  07/23/14 0500 07/24/14 0438  WBC 16.2* 13.4*  HGB 11.0* 10.9*  HCT 31.6* 32.5*  MCV 83.8 85.5  PLT 143* 139*   Cardiac Enzymes:  Recent Labs  07/21/14 1436 07/21/14 2036 07/22/14 0130  TROPONINI 1.33* 1.28* 1.55*   BNP: Invalid input(s): POCBNP D-Dimer: No results for input(s): DDIMER in the last 72 hours. Hemoglobin A1C: No results for input(s): HGBA1C in the last 72 hours. Fasting Lipid Panel: No results for  input(s): CHOL, HDL, LDLCALC, TRIG, CHOLHDL, LDLDIRECT in the last 72 hours. Thyroid Function Tests: No results for input(s): TSH, T4TOTAL, T3FREE, THYROIDAB in the last 72 hours.  Invalid input(s): FREET3 Anemia Panel: No results for input(s): VITAMINB12, FOLATE, FERRITIN, TIBC, IRON, RETICCTPCT in the last 72 hours. Coag Panel:   Lab Results  Component Value Date   INR 1.51* 07/24/2014   INR 1.63* 07/23/2014   INR 1.54* 07/23/2014    RADIOLOGY: US Renal  07/23/2014   CLINICAL DATA:  Acute renal failure  EXAM: RENAL / URINARY TRACT ULTRASOUND COMPLETE  COMPARISON:  None.  FINDINGS: Right Kidney:  Length: 9.7 cm. Possible 14 x 8 x 12 mm echogenic lesion in the interpolar region. No hydronephrosis.  Left Kidney:  Length: 10.8 cm.  No mass or hydronephrosis.  Bladder:  Decompressed by indwelling Foley catheter.  IMPRESSION: No hydronephrosis.  Possible 14 mm echogenic lesion in the interpolar right kidney, poorly evaluated. Consider follow-up CT or MR for further characterization, preferably with/without contrast after renal function improves.  Bladder is decompressed with indwelling Foley catheter.   Electronically Signed   By: Roselie Awkward.D.  On: 07/23/2014 18:47   Dg Chest Port 1 View  07/24/2014   CLINICAL DATA:  69 year old female with acute respiratory failure  EXAM: PORTABLE CHEST - 1 VIEW  COMPARISON:  Prior chest x-ray 07/23/2014  FINDINGS: Patient is intubated. The tip of the ET tube is 2.9 cm above the carina. The nasogastric tube has withdrawn. The tip now overlies the mid to lower esophagus. Stable position of left IJ approach central venous catheter. Catheter tip in good position at the superior cavoatrial junction. Patient is status post median sternotomy with evidence of cardiac valve replacement. Persistent low inspiratory volumes with bilateral effusions and associated atelectasis. Nodular opacity in the right mid lung again noted. No pneumothorax. No acute osseous  abnormality.  IMPRESSION: 1. The nasogastric tube has pulled back. The tip of the tube is now on the mid esophagus. 2. Other support apparatus remain in stable and satisfactory position. 3. Persistent low inspiratory volumes with bibasilar atelectasis and small effusions. 4. Persistent nodular appearance of opacity in the right mid to upper lung. While this may reflect an incidental nodular appearance of residual atelectasis/infiltrate, a true pulmonary nodule is not excluded. If this abnormality does not resolve on additional follow-up radiographs, chest CT may be ultimately warranted for further evaluation. These results of #1 above were called by telephone at the time of interpretation on 07/24/2014 at 8:45 am to the patient's nurse, Paticia Stack, who verbally acknowledged these results.   Electronically Signed   By: Malachy Moan M.D.   On: 07/24/2014 08:46   Dg Chest Port 1 View  07/23/2014   CLINICAL DATA:  CHF.  EXAM: PORTABLE CHEST - 1 VIEW  COMPARISON:  07/22/2014  FINDINGS: Endotracheal tube is in place, unchanged in position. Left IJ central line tip overlies the level of superior vena cava. Nasogastric tube tip is beyond the gastroesophageal junction, tip off the film.  There are persistent bilateral lung opacities which obscure the hemidiaphragms. Bilateral pleural effusions are present. Perihilar infiltrates appear stable.  IMPRESSION: Persistent bilateral infiltrates and bilateral pleural effusions.   Electronically Signed   By: Norva Pavlov M.D.   On: 07/23/2014 07:38   Dg Chest Port 1 View  07/22/2014   CLINICAL DATA:  Acute respiratory failure. On ventilator. Septic shock. Cardiac arrest.  EXAM: PORTABLE CHEST - 1 VIEW  COMPARISON:  07/21/2014  FINDINGS: Support lines and tubes in appropriate position. Symmetric bilateral mid and lower lung airspace disease and small to moderate bilateral pleural effusion show no significant change. Heart size is stable.  IMPRESSION: Symmetric airspace  disease and bilateral pleural effusions, without significant change.   Electronically Signed   By: Myles Rosenthal M.D.   On: 07/22/2014 07:12   Dg Chest Port 1 View  07/21/2014   CLINICAL DATA:  Respiratory failure  EXAM: PORTABLE CHEST - 1 VIEW  COMPARISON:  07/21/2014  FINDINGS: Cardiac shadow is stable. Postsurgical changes are again seen. An endotracheal tube is again identified 3.5 cm above the carina. A left jugular central line is noted in satisfactory position as is a nasogastric catheter. Bilateral pleural effusions are seen increased from the prior exam. Bibasilar infiltrative changes are noted as well. Stable changes in the left apex are seen.  IMPRESSION: Stable patchy opacities are noted within the bases as well as the left upper lobe. Increasing bilateral pleural effusions are seen.  Tubes and lines as described.   Electronically Signed   By: Alcide Clever M.D.   On: 07/21/2014 17:22   Dg Chest Portable  1 View  07/21/2014   CLINICAL DATA:  Cardiac arrest.  Hypoxia.  EXAM: PORTABLE CHEST - 1 VIEW  COMPARISON:  January 13, 2007  FINDINGS: The endotracheal tube tip is at the level of T1, approximately 11 cm above the carina. Nasogastric tube tip is in the stomach with the side port at the gastroesophageal junction. No pneumothorax. There is interstitial and patchy alveolar edema throughout the lungs bilaterally, slightly more on the right than on the left. Heart is enlarged with pulmonary venous hypertension. Patient is status post mitral valve replacement.  IMPRESSION: Tube positions as described without pneumothorax. Note that both the endotracheal tube and nasogastric tube may warrant advancing. There is evidence of underlying congestive heart failure.   Electronically Signed   By: Bretta Bang III M.D.   On: 07/21/2014 13:56   Dg Chest Port 1v Same Day  07/21/2014   CLINICAL DATA:  Central line placement  EXAM: PORTABLE CHEST - 1 VIEW SAME DAY  COMPARISON:  07/21/2014  FINDINGS:  Cardiomediastinal silhouette is stable. Persistent interstitial prominence bilateral consistent with mild pulmonary edema with slight improvement in aeration. Endotracheal tube again noted with tip at the level of T1 vertebral body. NG tube in place. There is left IJ central line with tip in SVC. No pneumothorax.  IMPRESSION: Persistent mild interstitial prominence bilateral consistent with pulmonary edema with slight improvement in aeration. Again noted endotracheal tube with tip at T1 level. Stable NG tube position. Left IJ central line with tip in SVC. No pneumothorax.   Electronically Signed   By: Natasha Mead M.D.   On: 07/21/2014 15:26      ASSESSMENT: [name]:    I discussed the case with Dr. Kirke Corin today.  Prosthetic MV not functioning. Will need replacement. COmplicated by shock and ARF.  Appreciate renal consult by Dr. Arrie Aran.  High likelihood she will need dialysis at some point. Stable on IV fluid to help renal function.  Cr slightly lower today.  She will get cath today.  Orders placed.  Consent obtained.   IV heparin for now.  Pressors have been needed for BP in the past but now off pressors.   Vent per CCM.   Spoke to patient's daughter.  Corky Crafts, MD  07/24/2014  9:54 AM

## 2014-07-24 NOTE — Progress Notes (Signed)
PULMONARY / CRITICAL CARE MEDICINE   Name: [name] MRN: [id number] DOB: 11/16/1945    ADMISSION DATE:  08/16/2014  REFERRING MD :  Bonney Roussel EDP  CHIEF COMPLAINT:  Shock  INITIAL PRESENTATION: 69 year old female presented to ED 16-Aug-2022 with AMS. In ED was profoundly hypotensive with elevated lactic, and unfortunately suffered 2 PEA arrests totaling approximately 5 minutes in duration. Post arrest she is in refractory shock despite 4L IVF resuscitation and 2 vasoactive agents. PCCM to admit.  STUDIES:  Echo 2022-08-16 > EF 55-60%. Mild aortic regurg. Mechanical MVR. Questionable disc malfunction. RV mildly dilated. PA peak pressure . Cardiac cath 7/17 >> no CAD  SIGNIFICANT EVENTS: August 16, 2022 coded in ED, PEA arrest 6 mins. PCCM admit to ICU intubated  SUBJECTIVE:  Denies chest pain.  VITAL SIGNS: Temp:  [97.5 F (36.4 C)-98.5 F (36.9 C)] 97.9 F (36.6 C) (07/17 1220) Pulse Rate:  [32-111] 100 (07/17 1300) Resp:  [13-22] 20 (07/17 1300) BP: (81-121)/(56-91) 104/80 mmHg (07/17 1300) SpO2:  [61 %-100 %] 85 % (07/17 1300) Arterial Line BP: (84-151)/(50-112) 113/79 mmHg (07/17 1300) FiO2 (%):  [40 %] 40 % (07/17 1210) Weight:  [181 lb 7 oz (82.3 kg)] 181 lb 7 oz (82.3 kg) (07/17 0500) VENTILATOR SETTINGS: Vent Mode:  [-] PRVC FiO2 (%):  [40 %] 40 % Set Rate:  [20 bmp] 20 bmp Vt Set:  [480 mL] 480 mL PEEP:  [5 cmH20] 5 cmH20 Plateau Pressure:  [24 cmH20-29 cmH20] 29 cmH20 INTAKE / OUTPUT:  Intake/Output Summary (Last 24 hours) at 07/24/14 1323 Last data filed at 07/24/14 1300  Gross per 24 hour  Intake 1956.49 ml  Output    300 ml  Net 1656.49 ml    PHYSICAL EXAMINATION: General: pleasant Neuro: moves all extremities HEENT: ETT in place Cardiovascular:  2/6 SM Lungs: no wheeze Abdomen: soft, non tender Musculoskeletal: no edema Skin: no rashse  LABS:  CBC  Recent Labs Lab 07/22/14 0400 07/23/14 0500 07/24/14 0438  WBC 12.0* 16.2* 13.4*  HGB 13.4 11.0*  10.9*  HCT 37.8 31.6* 32.5*  PLT 135* 143* 139*   Coag's  Recent Labs Lab 07/23/14 0142 07/23/14 0500 07/24/14 0438  APTT  --  54* 174*  INR 1.54* 1.63* 1.51*   BMET  Recent Labs Lab 07/22/14 1425 07/23/14 0500 07/24/14 0438  NA 144 143 143  K 2.9* 3.0* 3.5  CL 109 109 111  CO2 23 22 22   BUN 63* 70* 76*  CREATININE 3.75* 3.94* 3.85*  GLUCOSE 122* 117* 96   Electrolytes  Recent Labs Lab 07/22/14 0400  07/22/14 1425 07/23/14 0500 07/24/14 0438  CALCIUM 8.8*  < > 9.1 10.0 9.7  MG 1.1*  --   --  1.2* 1.3*  PHOS 1.4*  --   --  3.1 3.4  < > = values in this interval not displayed.   Sepsis Markers  Recent Labs Lab 08/16/2014 1436  08-16-14 2059 07/22/14 0400 07/22/14 0430 07/22/14 0701 07/23/14 0500  LATICACIDVEN  --   < > 6.1*  --  3.5* 2.9*  --   PROCALCITON 0.31  --   --  2.90  --   --  1.92  < > = values in this interval not displayed.   ABG  Recent Labs Lab Aug 16, 2014 1800 07/22/14 0002 07/22/14 0503  PHART 7.305* 7.453* 7.599*  PCO2ART 23.7* 21.8* 19.6*  PO2ART 84.0 137.0* 150.0*   Liver Enzymes  Recent Labs Lab 2014-08-16 1207 07/23/14 0500 07/24/14 7253  AST 72* 415* 173*  ALT 49 672* 436*  ALKPHOS 56 60 55  BILITOT 2.4* 1.6* 1.6*  ALBUMIN 3.0* 2.8*  2.8* 2.6*  2.6*   Cardiac Enzymes  Recent Labs Lab 07/21/14 1436 07/21/14 2036 07/22/14 0130  TROPONINI 1.33* 1.28* 1.55*   Glucose  Recent Labs Lab 07/23/14 1554 07/23/14 2020 07/24/14 0029 07/24/14 0348 07/24/14 0810 07/24/14 1219  GLUCAP 105* 106* 85 90 103* 86    Imaging US Renal  07/23/2014   CLINICAL DATA:  Acute renal failure  EXAM: RENAL / URINARY TRACT ULTRASOUND COMPLETE  COMPARISON:  None.  FINDINGS: Right Kidney:  Length: 9.7 cm. Possible 14 x 8 x 12 mm echogenic lesion in the interpolar region. No hydronephrosis.  Left Kidney:  Length: 10.8 cm.  No mass or hydronephrosis.  Bladder:  Decompressed by indwelling Foley catheter.  IMPRESSION: No hydronephrosis.   Possible 14 mm echogenic lesion in the interpolar right kidney, poorly evaluated. Consider follow-up CT or MR for further characterization, preferably with/without contrast after renal function improves.  Bladder is decompressed with indwelling Foley catheter.   Electronically Signed   By: Charline Bills M.D.   On: 07/23/2014 18:47   Dg Chest Port 1 View  07/24/2014   CLINICAL DATA:  69 year old female with acute respiratory failure  EXAM: PORTABLE CHEST - 1 VIEW  COMPARISON:  Prior chest x-ray 07/23/2014  FINDINGS: Patient is intubated. The tip of the ET tube is 2.9 cm above the carina. The nasogastric tube has withdrawn. The tip now overlies the mid to lower esophagus. Stable position of left IJ approach central venous catheter. Catheter tip in good position at the superior cavoatrial junction. Patient is status post median sternotomy with evidence of cardiac valve replacement. Persistent low inspiratory volumes with bilateral effusions and associated atelectasis. Nodular opacity in the right mid lung again noted. No pneumothorax. No acute osseous abnormality.  IMPRESSION: 1. The nasogastric tube has pulled back. The tip of the tube is now on the mid esophagus. 2. Other support apparatus remain in stable and satisfactory position. 3. Persistent low inspiratory volumes with bibasilar atelectasis and small effusions. 4. Persistent nodular appearance of opacity in the right mid to upper lung. While this may reflect an incidental nodular appearance of residual atelectasis/infiltrate, a true pulmonary nodule is not excluded. If this abnormality does not resolve on additional follow-up radiographs, chest CT may be ultimately warranted for further evaluation. These results of #1 above were called by telephone at the time of interpretation on 07/24/2014 at 8:45 am to the patient's nurse, Paticia Stack, who verbally acknowledged these results.   Electronically Signed   By: Malachy Moan M.D.   On: 07/24/2014 08:46      ASSESSMENT / PLAN:  PULMONARY ETT 7/14 >>> A: Acute hypoxic respiratory failure in setting of mitral valve malfunction, CHF. P:   Full vent support F/u CXR  CARDIOVASCULAR Left IJ CVC 7/14 >>> A:  Shock >> more likely from mitral valve dysfx >> off pressors 7/16. Hx of HTN, HLD. P:  TCTS planning valve re-do 7/18  RENAL A:   Acute Renal Failure. Lactic Acidosis - Improving. Hypokalemia. P:   Even fluid balance Monitor renal fx, urine outpt Renal consulted 7/16  GASTROINTESTINAL A:   Acute Liver Injury 2nd to shock >> improving. Nutrition. P:   F/u LFT intermittently Tube feeds Pepcid for SUP  HEMATOLOGIC A:   Coagulopathy - Secondary to Coumadin. P:  Heparin gtt F/u CBC  INFECTIOUS A:   Initial concern for  septic shock >> more likely hypovolemia and mitral valve dysfx.   P:   D/c Abx 7/16  BCx2 7/14 >>>  ENDOCRINE A:   No acute issues. P:   Monitor blood sugar on BMET  NEUROLOGIC A:   Acute metabolic encephalopathy >> improved. P:   RASS goal: -1  Updated family at bedside.   Coralyn Helling, MD [facility] Pulmonary/Critical Care 07/24/2014, 1:23 PM Pager:  [phone] After 3pm call: [phone]

## 2014-07-25 ENCOUNTER — Inpatient Hospital Stay (HOSPITAL_COMMUNITY): Payer: Medicare Other | Admitting: Anesthesiology

## 2014-07-25 ENCOUNTER — Encounter (HOSPITAL_COMMUNITY): Payer: Self-pay | Admitting: Cardiovascular Disease

## 2014-07-25 ENCOUNTER — Inpatient Hospital Stay (HOSPITAL_COMMUNITY): Payer: Medicare Other

## 2014-07-25 ENCOUNTER — Encounter (HOSPITAL_COMMUNITY)
Admission: EM | Disposition: A | Discharge status: Rehabilitation Facility | Payer: PRIVATE HEALTH INSURANCE | Source: Home / Self Care | Attending: Thoracic Surgery (Cardiothoracic Vascular Surgery)

## 2014-07-25 DIAGNOSIS — Z953 Presence of xenogenic heart valve: Secondary | ICD-10-CM

## 2014-07-25 DIAGNOSIS — T82867A Thrombosis of cardiac prosthetic devices, implants and grafts, initial encounter: Secondary | ICD-10-CM

## 2014-07-25 DIAGNOSIS — Z952 Presence of prosthetic heart valve: Secondary | ICD-10-CM

## 2014-07-25 HISTORY — PX: MITRAL VALVE REPLACEMENT: SHX147

## 2014-07-25 HISTORY — DX: Presence of xenogenic heart valve: Z95.3

## 2014-07-25 HISTORY — DX: Thrombosis due to cardiac prosthetic devices, implants and grafts, initial encounter: T82.867A

## 2014-07-25 LAB — POCT I-STAT, CHEM 8
BUN: 75 mg/dL — ABNORMAL HIGH (ref 6–20)
BUN: 67 mg/dL — ABNORMAL HIGH (ref 6–20)
BUN: 64 mg/dL — ABNORMAL HIGH (ref 6–20)
BUN: 62 mg/dL — ABNORMAL HIGH (ref 6–20)
BUN: 66 mg/dL — ABNORMAL HIGH (ref 6–20)
BUN: 63 mg/dL — ABNORMAL HIGH (ref 6–20)
CALCIUM ION: 1.38 mmol/L — AB (ref 1.13–1.30)
CALCIUM ION: 1.24 mmol/L (ref 1.13–1.30)
CALCIUM ION: 1.06 mmol/L — AB (ref 1.13–1.30)
CALCIUM ION: 1.17 mmol/L (ref 1.13–1.30)
CALCIUM ION: 1.2 mmol/L (ref 1.13–1.30)
CHLORIDE: 110 mmol/L (ref 101–111)
CHLORIDE: 110 mmol/L (ref 101–111)
CREATININE: 3.2 mg/dL — AB (ref 0.44–1.00)
CREATININE: 3 mg/dL — AB (ref 0.44–1.00)
Calcium, Ion: 1.15 mmol/L (ref 1.13–1.30)
Chloride: 111 mmol/L (ref 101–111)
Chloride: 114 mmol/L — ABNORMAL HIGH (ref 101–111)
Chloride: 105 mmol/L (ref 101–111)
Chloride: 110 mmol/L (ref 101–111)
Creatinine, Ser: 2.9 mg/dL — ABNORMAL HIGH (ref 0.44–1.00)
Creatinine, Ser: 2.7 mg/dL — ABNORMAL HIGH (ref 0.44–1.00)
Creatinine, Ser: 2.9 mg/dL — ABNORMAL HIGH (ref 0.44–1.00)
Creatinine, Ser: 3.2 mg/dL — ABNORMAL HIGH (ref 0.44–1.00)
GLUCOSE: 148 mg/dL — AB (ref 65–99)
GLUCOSE: 146 mg/dL — AB (ref 65–99)
GLUCOSE: 132 mg/dL — AB (ref 65–99)
Glucose, Bld: 112 mg/dL — ABNORMAL HIGH (ref 65–99)
Glucose, Bld: 127 mg/dL — ABNORMAL HIGH (ref 65–99)
Glucose, Bld: 132 mg/dL — ABNORMAL HIGH (ref 65–99)
HCT: 33 % — ABNORMAL LOW (ref 36.0–46.0)
HCT: 30 % — ABNORMAL LOW (ref 36.0–46.0)
HCT: 19 % — ABNORMAL LOW (ref 36.0–46.0)
HCT: 26 % — ABNORMAL LOW (ref 36.0–46.0)
HEMATOCRIT: 25 % — AB (ref 36.0–46.0)
HEMATOCRIT: 23 % — AB (ref 36.0–46.0)
HEMOGLOBIN: 10.2 g/dL — AB (ref 12.0–15.0)
HEMOGLOBIN: 8.8 g/dL — AB (ref 12.0–15.0)
Hemoglobin: 11.2 g/dL — ABNORMAL LOW (ref 12.0–15.0)
Hemoglobin: 6.5 g/dL — CL (ref 12.0–15.0)
Hemoglobin: 8.5 g/dL — ABNORMAL LOW (ref 12.0–15.0)
Hemoglobin: 7.8 g/dL — ABNORMAL LOW (ref 12.0–15.0)
POTASSIUM: 3.5 mmol/L (ref 3.5–5.1)
POTASSIUM: 3.6 mmol/L (ref 3.5–5.1)
Potassium: 3.4 mmol/L — ABNORMAL LOW (ref 3.5–5.1)
Potassium: 3.3 mmol/L — ABNORMAL LOW (ref 3.5–5.1)
Potassium: 4.3 mmol/L (ref 3.5–5.1)
Potassium: 3.8 mmol/L (ref 3.5–5.1)
SODIUM: 144 mmol/L (ref 135–145)
SODIUM: 145 mmol/L (ref 135–145)
SODIUM: 148 mmol/L — AB (ref 135–145)
Sodium: 145 mmol/L (ref 135–145)
Sodium: 141 mmol/L (ref 135–145)
Sodium: 146 mmol/L — ABNORMAL HIGH (ref 135–145)
TCO2: 23 mmol/L (ref 0–100)
TCO2: 20 mmol/L (ref 0–100)
TCO2: 20 mmol/L (ref 0–100)
TCO2: 23 mmol/L (ref 0–100)
TCO2: 22 mmol/L (ref 0–100)
TCO2: 22 mmol/L (ref 0–100)

## 2014-07-25 LAB — CREATININE, SERUM
Creatinine, Ser: 3.27 mg/dL — ABNORMAL HIGH (ref 0.44–1.00)
GFR calc Af Amer: 16 mL/min — ABNORMAL LOW (ref >60)
GFR, EST NON AFRICAN AMERICAN: 14 mL/min — AB (ref >60)

## 2014-07-25 LAB — HEPATIC FUNCTION PANEL
ALBUMIN: 2.8 g/dL — AB (ref 3.5–5.0)
ALK PHOS: 57 U/L (ref 38–126)
ALT: 304 U/L — AB (ref 14–54)
AST: 77 U/L — AB (ref 15–41)
BILIRUBIN DIRECT: 0.3 mg/dL (ref 0.1–0.5)
BILIRUBIN TOTAL: 1.4 mg/dL — AB (ref 0.3–1.2)
Indirect Bilirubin: 1.1 mg/dL — ABNORMAL HIGH (ref 0.3–0.9)
TOTAL PROTEIN: 5.6 g/dL — AB (ref 6.5–8.1)

## 2014-07-25 LAB — CBC
HCT: 34.7 % — ABNORMAL LOW (ref 36.0–46.0)
HCT: 27.2 % — ABNORMAL LOW (ref 36.0–46.0)
HEMATOCRIT: 26.5 % — AB (ref 36.0–46.0)
HEMOGLOBIN: 9.2 g/dL — AB (ref 12.0–15.0)
Hemoglobin: 11.4 g/dL — ABNORMAL LOW (ref 12.0–15.0)
Hemoglobin: 9 g/dL — ABNORMAL LOW (ref 12.0–15.0)
MCH: 28.5 pg (ref 26.0–34.0)
MCH: 28.9 pg (ref 26.0–34.0)
MCH: 29.1 pg (ref 26.0–34.0)
MCHC: 32.9 g/dL (ref 30.0–36.0)
MCHC: 33.8 g/dL (ref 30.0–36.0)
MCHC: 34 g/dL (ref 30.0–36.0)
MCV: 86.8 fL (ref 78.0–100.0)
MCV: 85.5 fL (ref 78.0–100.0)
MCV: 85.8 fL (ref 78.0–100.0)
Platelets: 165 10*3/uL (ref 150–400)
Platelets: 144 10*3/uL — ABNORMAL LOW (ref 150–400)
Platelets: 106 10*3/uL — ABNORMAL LOW (ref 150–400)
RBC: 4 MIL/uL (ref 3.87–5.11)
RBC: 3.18 MIL/uL — AB (ref 3.87–5.11)
RBC: 3.09 MIL/uL — AB (ref 3.87–5.11)
RDW: 13.5 % (ref 11.5–15.5)
RDW: 13 % (ref 11.5–15.5)
RDW: 13.3 % (ref 11.5–15.5)
WBC: 13.5 10*3/uL — ABNORMAL HIGH (ref 4.0–10.5)
WBC: 16.1 10*3/uL — ABNORMAL HIGH (ref 4.0–10.5)
WBC: 11.1 10*3/uL — AB (ref 4.0–10.5)

## 2014-07-25 LAB — POCT I-STAT 3, ART BLOOD GAS (G3+)
Acid-base deficit: 5 mmol/L — ABNORMAL HIGH (ref 0.0–2.0)
Acid-base deficit: 3 mmol/L — ABNORMAL HIGH (ref 0.0–2.0)
Bicarbonate: 20 mEq/L (ref 20.0–24.0)
Bicarbonate: 22 mEq/L (ref 20.0–24.0)
O2 Saturation: 100 %
O2 Saturation: 95 %
PO2 ART: 266 mmHg — AB (ref 80.0–100.0)
Patient temperature: 35.5
TCO2: 21 mmol/L (ref 0–100)
TCO2: 23 mmol/L (ref 0–100)
pCO2 arterial: 33.2 mmHg — ABNORMAL LOW (ref 35.0–45.0)
pCO2 arterial: 33.5 mmHg — ABNORMAL LOW (ref 35.0–45.0)
pH, Arterial: 7.389 (ref 7.350–7.450)
pH, Arterial: 7.417 (ref 7.350–7.450)
pO2, Arterial: 69 mmHg — ABNORMAL LOW (ref 80.0–100.0)

## 2014-07-25 LAB — RENAL FUNCTION PANEL
ANION GAP: 12 (ref 5–15)
Albumin: 2.8 g/dL — ABNORMAL LOW (ref 3.5–5.0)
BUN: 75 mg/dL — ABNORMAL HIGH (ref 6–20)
CHLORIDE: 111 mmol/L (ref 101–111)
CO2: 24 mmol/L (ref 22–32)
Calcium: 9.7 mg/dL (ref 8.9–10.3)
Creatinine, Ser: 3.48 mg/dL — ABNORMAL HIGH (ref 0.44–1.00)
GFR calc Af Amer: 15 mL/min — ABNORMAL LOW (ref >60)
GFR calc non Af Amer: 13 mL/min — ABNORMAL LOW (ref >60)
Glucose, Bld: 127 mg/dL — ABNORMAL HIGH (ref 65–99)
Phosphorus: 4.2 mg/dL (ref 2.5–4.6)
Potassium: 3.7 mmol/L (ref 3.5–5.1)
SODIUM: 147 mmol/L — AB (ref 135–145)

## 2014-07-25 LAB — PREPARE RBC (CROSSMATCH)

## 2014-07-25 LAB — GLUCOSE, CAPILLARY
GLUCOSE-CAPILLARY: 109 mg/dL — AB (ref 65–99)
GLUCOSE-CAPILLARY: 116 mg/dL — AB (ref 65–99)
GLUCOSE-CAPILLARY: 81 mg/dL (ref 65–99)
GLUCOSE-CAPILLARY: 98 mg/dL (ref 65–99)
Glucose-Capillary: 77 mg/dL (ref 65–99)

## 2014-07-25 LAB — GRAM STAIN

## 2014-07-25 LAB — APTT
aPTT: 68 seconds — ABNORMAL HIGH (ref 24–37)
aPTT: 39 seconds — ABNORMAL HIGH (ref 24–37)

## 2014-07-25 LAB — POCT I-STAT 4, (NA,K, GLUC, HGB,HCT)
GLUCOSE: 119 mg/dL — AB (ref 65–99)
HEMATOCRIT: 28 % — AB (ref 36.0–46.0)
Hemoglobin: 9.5 g/dL — ABNORMAL LOW (ref 12.0–15.0)
Potassium: 3.3 mmol/L — ABNORMAL LOW (ref 3.5–5.1)
SODIUM: 147 mmol/L — AB (ref 135–145)

## 2014-07-25 LAB — PROTIME-INR
INR: 1.36 (ref 0.00–1.49)
INR: 2.07 — AB (ref 0.00–1.49)
PROTHROMBIN TIME: 16.9 s — AB (ref 11.6–15.2)
Prothrombin Time: 23.1 seconds — ABNORMAL HIGH (ref 11.6–15.2)

## 2014-07-25 LAB — PLATELET COUNT: PLATELETS: 71 10*3/uL — AB (ref 150–400)

## 2014-07-25 LAB — HEMOGLOBIN AND HEMATOCRIT, BLOOD
HEMATOCRIT: 23.9 % — AB (ref 36.0–46.0)
HEMOGLOBIN: 8.1 g/dL — AB (ref 12.0–15.0)

## 2014-07-25 LAB — FIBRINOGEN: FIBRINOGEN: 202 mg/dL — AB (ref 204–475)

## 2014-07-25 LAB — MAGNESIUM
Magnesium: 1.4 mg/dL — ABNORMAL LOW (ref 1.7–2.4)
Magnesium: 1.5 mg/dL — ABNORMAL LOW (ref 1.7–2.4)

## 2014-07-25 SURGERY — REPLACEMENT, MITRAL VALVE, REPEAT
Anesthesia: General | Site: Chest

## 2014-07-25 MED ORDER — HEPARIN SODIUM (PORCINE) 1000 UNIT/ML IJ SOLN
INTRAMUSCULAR | Status: DC | PRN
  Administered 2014-07-25: 31000 [IU] via INTRAVENOUS

## 2014-07-25 MED ORDER — SODIUM CHLORIDE 0.9 % IV SOLN: INTRAVENOUS | Status: DC

## 2014-07-25 MED ORDER — POTASSIUM CHLORIDE 10 MEQ/50ML IV SOLN
10.0000 meq | INTRAVENOUS | Status: AC
  Administered 2014-07-25 (×2): 10 meq via INTRAVENOUS

## 2014-07-25 MED ORDER — FENTANYL CITRATE (PF) 250 MCG/5ML IJ SOLN
INTRAMUSCULAR | Status: AC
  Filled 2014-07-25: qty 5

## 2014-07-25 MED ORDER — DEXMEDETOMIDINE HCL IN NACL 200 MCG/50ML IV SOLN
0.0000 ug/kg/h | INTRAVENOUS | Status: DC
  Administered 2014-07-25: 0.5 ug/kg/h via INTRAVENOUS
  Filled 2014-07-25 (×2): qty 50

## 2014-07-25 MED ORDER — LACTATED RINGERS IV SOLN: 500.0000 mL | Freq: Once | INTRAVENOUS | Status: AC | PRN

## 2014-07-25 MED ORDER — MORPHINE SULFATE 2 MG/ML IJ SOLN: 1.0000 mg | INTRAMUSCULAR | Status: DC | PRN

## 2014-07-25 MED ORDER — PROTAMINE SULFATE 10 MG/ML IV SOLN
INTRAVENOUS | Status: DC | PRN
  Administered 2014-07-25 (×2): 50 mg via INTRAVENOUS
  Administered 2014-07-25: 90 mg via INTRAVENOUS
  Administered 2014-07-25: 50 mg via INTRAVENOUS
  Administered 2014-07-25: 10 mg via INTRAVENOUS

## 2014-07-25 MED ORDER — MAGNESIUM SULFATE 4 GM/100ML IV SOLN: 4.0000 g | Freq: Once | INTRAVENOUS | Status: DC

## 2014-07-25 MED ORDER — CHLORHEXIDINE GLUCONATE 0.12 % MT SOLN
15.0000 mL | Freq: Two times a day (BID) | OROMUCOSAL | Status: DC
  Administered 2014-07-25 – 2014-07-27 (×4): 15 mL via OROMUCOSAL
  Filled 2014-07-25 (×4): qty 15

## 2014-07-25 MED ORDER — ACETAMINOPHEN 160 MG/5ML PO SOLN
650.0000 mg | Freq: Once | ORAL | Status: AC
  Administered 2014-07-25: 650 mg

## 2014-07-25 MED ORDER — LACTATED RINGERS IV SOLN: INTRAVENOUS | Status: DC

## 2014-07-25 MED ORDER — PHENYLEPHRINE HCL 10 MG/ML IJ SOLN
INTRAMUSCULAR | Status: DC | PRN
  Administered 2014-07-25 (×2): 120 ug via INTRAVENOUS

## 2014-07-25 MED ORDER — SODIUM CHLORIDE 0.9 % IJ SOLN
3.0000 mL | Freq: Two times a day (BID) | INTRAMUSCULAR | Status: DC
  Administered 2014-07-26 – 2014-08-03 (×8): 3 mL via INTRAVENOUS

## 2014-07-25 MED ORDER — FENTANYL CITRATE (PF) 250 MCG/5ML IJ SOLN
INTRAMUSCULAR | Status: AC
Start: 2014-07-25 — End: 2014-07-25
  Filled 2014-07-25: qty 5

## 2014-07-25 MED ORDER — SODIUM CHLORIDE 0.9 % IR SOLN
Status: DC | PRN
  Administered 2014-07-25: 3000 mL

## 2014-07-25 MED ORDER — SODIUM CHLORIDE 0.9 % IV SOLN
INTRAVENOUS | Status: DC
  Administered 2014-07-25: 20 mL/h via INTRAVENOUS

## 2014-07-25 MED ORDER — SODIUM CHLORIDE 0.9 % IJ SOLN: 3.0000 mL | INTRAMUSCULAR | Status: DC | PRN

## 2014-07-25 MED ORDER — MIDAZOLAM HCL 2 MG/2ML IJ SOLN: 2.0000 mg | INTRAMUSCULAR | Status: DC | PRN

## 2014-07-25 MED ORDER — ACETAMINOPHEN 650 MG RE SUPP: 650.0000 mg | Freq: Once | RECTAL | Status: AC

## 2014-07-25 MED ORDER — FENTANYL CITRATE (PF) 250 MCG/5ML IJ SOLN
INTRAMUSCULAR | Status: DC | PRN
  Administered 2014-07-25 (×3): 250 ug via INTRAVENOUS
  Administered 2014-07-25: 200 ug via INTRAVENOUS
  Administered 2014-07-25: 50 ug via INTRAVENOUS

## 2014-07-25 MED ORDER — MILRINONE IN DEXTROSE 20 MG/100ML IV SOLN
0.1250 ug/kg/min | INTRAVENOUS | Status: DC
  Filled 2014-07-25: qty 100

## 2014-07-25 MED ORDER — BISACODYL 10 MG RE SUPP: 10.0000 mg | Freq: Every day | RECTAL | Status: DC

## 2014-07-25 MED ORDER — SODIUM CHLORIDE 0.9 % IV SOLN
250.0000 mL | INTRAVENOUS | Status: DC
  Administered 2014-07-26: 250 mL via INTRAVENOUS

## 2014-07-25 MED ORDER — PHENYLEPHRINE HCL 10 MG/ML IJ SOLN
10.0000 mg | INTRAVENOUS | Status: DC | PRN
  Administered 2014-07-25: 15 ug/min via INTRAVENOUS

## 2014-07-25 MED ORDER — ONDANSETRON HCL 4 MG/2ML IJ SOLN: 4.0000 mg | Freq: Four times a day (QID) | INTRAMUSCULAR | Status: DC | PRN

## 2014-07-25 MED ORDER — METOPROLOL TARTRATE 1 MG/ML IV SOLN: 2.5000 mg | INTRAVENOUS | Status: DC | PRN

## 2014-07-25 MED ORDER — FUROSEMIDE 10 MG/ML IJ SOLN
INTRAMUSCULAR | Status: AC
  Filled 2014-07-25: qty 8

## 2014-07-25 MED ORDER — DOPAMINE-DEXTROSE 3.2-5 MG/ML-% IV SOLN
0.0000 ug/kg/min | INTRAVENOUS | Status: DC
  Administered 2014-07-25: 3 ug/kg/min via INTRAVENOUS
  Administered 2014-07-27: 2 ug/kg/min via INTRAVENOUS
  Filled 2014-07-25: qty 250

## 2014-07-25 MED ORDER — METOPROLOL TARTRATE 12.5 MG HALF TABLET
12.5000 mg | ORAL_TABLET | Freq: Two times a day (BID) | ORAL | Status: DC
  Administered 2014-07-27: 12.5 mg via ORAL
  Filled 2014-07-25 (×7): qty 1

## 2014-07-25 MED ORDER — PROTAMINE SULFATE 10 MG/ML IV SOLN
INTRAVENOUS | Status: AC
  Filled 2014-07-25: qty 5

## 2014-07-25 MED ORDER — FENTANYL CITRATE (PF) 250 MCG/5ML IJ SOLN: INTRAMUSCULAR | Status: DC | PRN

## 2014-07-25 MED ORDER — SODIUM CHLORIDE 0.45 % IV SOLN
INTRAVENOUS | Status: DC | PRN
  Administered 2014-07-25: 20 mL/h via INTRAVENOUS

## 2014-07-25 MED ORDER — MORPHINE SULFATE 2 MG/ML IJ SOLN: 2.0000 mg | INTRAMUSCULAR | Status: DC | PRN

## 2014-07-25 MED ORDER — FAMOTIDINE IN NACL 20-0.9 MG/50ML-% IV SOLN
20.0000 mg | INTRAVENOUS | Status: DC
  Administered 2014-07-25 – 2014-07-26 (×2): 20 mg via INTRAVENOUS
  Filled 2014-07-25: qty 50

## 2014-07-25 MED ORDER — DEXTROSE 5 % IV SOLN
1.5000 g | Freq: Two times a day (BID) | INTRAVENOUS | Status: AC
  Administered 2014-07-25 – 2014-07-27 (×4): 1.5 g via INTRAVENOUS
  Filled 2014-07-25 (×4): qty 1.5

## 2014-07-25 MED ORDER — ACETAMINOPHEN 160 MG/5ML PO SOLN
1000.0000 mg | Freq: Four times a day (QID) | ORAL | Status: DC
  Administered 2014-07-26 (×2): 1000 mg
  Filled 2014-07-25: qty 40.6

## 2014-07-25 MED ORDER — MILRINONE IN DEXTROSE 20 MG/100ML IV SOLN
INTRAVENOUS | Status: DC | PRN
  Administered 2014-07-25: .3 mg/kg/min via INTRAVENOUS

## 2014-07-25 MED ORDER — 0.9 % SODIUM CHLORIDE (POUR BTL) OPTIME
TOPICAL | Status: DC | PRN
  Administered 2014-07-25: 1000 mL

## 2014-07-25 MED ORDER — VANCOMYCIN HCL IN DEXTROSE 1-5 GM/200ML-% IV SOLN
1000.0000 mg | Freq: Once | INTRAVENOUS | Status: AC
  Administered 2014-07-25: 1000 mg via INTRAVENOUS
  Filled 2014-07-25: qty 200

## 2014-07-25 MED ORDER — CETYLPYRIDINIUM CHLORIDE 0.05 % MT LIQD
7.0000 mL | Freq: Four times a day (QID) | OROMUCOSAL | Status: DC
  Administered 2014-07-26 – 2014-07-27 (×6): 7 mL via OROMUCOSAL

## 2014-07-25 MED ORDER — OXYCODONE HCL 5 MG PO TABS: 5.0000 mg | ORAL_TABLET | ORAL | Status: DC | PRN

## 2014-07-25 MED ORDER — HEPARIN SODIUM (PORCINE) 1000 UNIT/ML IJ SOLN
INTRAMUSCULAR | Status: AC
  Filled 2014-07-25: qty 1

## 2014-07-25 MED ORDER — MIDAZOLAM HCL 5 MG/ML IJ SOLN
INTRAMUSCULAR | Status: DC | PRN
  Administered 2014-07-25: 3 mg via INTRAVENOUS
  Administered 2014-07-25: 4 mg via INTRAVENOUS
  Administered 2014-07-25: 1 mg via INTRAVENOUS
  Administered 2014-07-25: 2 mg via INTRAVENOUS

## 2014-07-25 MED ORDER — ALBUMIN HUMAN 5 % IV SOLN
250.0000 mL | INTRAVENOUS | Status: DC | PRN
  Administered 2014-07-25 (×2): 250 mL via INTRAVENOUS

## 2014-07-25 MED ORDER — SODIUM CHLORIDE 0.9 % IV SOLN
INTRAVENOUS | Status: DC
  Administered 2014-07-25: 100 mL/h via INTRAVENOUS

## 2014-07-25 MED ORDER — DOCUSATE SODIUM 100 MG PO CAPS
200.0000 mg | ORAL_CAPSULE | Freq: Every day | ORAL | Status: DC
  Administered 2014-08-02 – 2014-08-03 (×2): 200 mg via ORAL
  Filled 2014-07-25 (×6): qty 2

## 2014-07-25 MED ORDER — INSULIN ASPART 100 UNIT/ML ~~LOC~~ SOLN
0.0000 [IU] | SUBCUTANEOUS | Status: DC
  Administered 2014-07-26 (×5): 2 [IU] via SUBCUTANEOUS

## 2014-07-25 MED ORDER — MILRINONE IN DEXTROSE 20 MG/100ML IV SOLN
0.3000 ug/kg/min | INTRAVENOUS | Status: DC
  Administered 2014-07-25 (×2): 0.3 ug/kg/min via INTRAVENOUS
  Filled 2014-07-25: qty 100

## 2014-07-25 MED ORDER — SODIUM CHLORIDE 0.9 % IV SOLN
INTRAVENOUS | Status: DC
  Administered 2014-07-25: 1.6 [IU]/h via INTRAVENOUS

## 2014-07-25 MED ORDER — ASPIRIN EC 325 MG PO TBEC
325.0000 mg | DELAYED_RELEASE_TABLET | Freq: Every day | ORAL | Status: DC
  Administered 2014-07-27: 325 mg via ORAL
  Filled 2014-07-25 (×3): qty 1

## 2014-07-25 MED ORDER — ACETAMINOPHEN 500 MG PO TABS
1000.0000 mg | ORAL_TABLET | Freq: Four times a day (QID) | ORAL | Status: AC
  Administered 2014-07-27 – 2014-07-30 (×12): 1000 mg via ORAL
  Filled 2014-07-25 (×18): qty 2

## 2014-07-25 MED ORDER — INSULIN REGULAR BOLUS VIA INFUSION
0.0000 [IU] | Freq: Three times a day (TID) | INTRAVENOUS | Status: DC
  Filled 2014-07-25: qty 10

## 2014-07-25 MED ORDER — SODIUM CHLORIDE 0.9 % IV SOLN: Freq: Once | INTRAVENOUS | Status: DC

## 2014-07-25 MED ORDER — DEXTROSE 5 % IV SOLN
0.0000 ug/min | INTRAVENOUS | Status: DC
  Administered 2014-07-25: 5 ug/min via INTRAVENOUS

## 2014-07-25 MED ORDER — MIDAZOLAM HCL 10 MG/2ML IJ SOLN
INTRAMUSCULAR | Status: AC
  Filled 2014-07-25: qty 2

## 2014-07-25 MED ORDER — VASOPRESSIN 20 UNIT/ML IV SOLN
INTRAVENOUS | Status: DC | PRN
  Administered 2014-07-25 (×2): 1 [IU] via INTRAVENOUS

## 2014-07-25 MED ORDER — BISACODYL 5 MG PO TBEC
10.0000 mg | DELAYED_RELEASE_TABLET | Freq: Every day | ORAL | Status: DC
  Administered 2014-08-02 – 2014-08-03 (×2): 10 mg via ORAL
  Filled 2014-07-25 (×2): qty 2

## 2014-07-25 MED ORDER — TRAMADOL HCL 50 MG PO TABS
50.0000 mg | ORAL_TABLET | Freq: Two times a day (BID) | ORAL | Status: DC | PRN
  Administered 2014-07-27 – 2014-08-01 (×2): 50 mg via ORAL
  Filled 2014-07-25 (×2): qty 1

## 2014-07-25 MED ORDER — METOPROLOL TARTRATE 25 MG/10 ML ORAL SUSPENSION
12.5000 mg | Freq: Two times a day (BID) | ORAL | Status: DC
  Filled 2014-07-25 (×7): qty 5

## 2014-07-25 MED ORDER — PANTOPRAZOLE SODIUM 40 MG PO TBEC: 40.0000 mg | DELAYED_RELEASE_TABLET | Freq: Every day | ORAL | Status: DC

## 2014-07-25 MED ORDER — PROPOFOL 10 MG/ML IV BOLUS
INTRAVENOUS | Status: AC
  Filled 2014-07-25: qty 20

## 2014-07-25 MED ORDER — FUROSEMIDE 10 MG/ML IJ SOLN
80.0000 mg | Freq: Once | INTRAMUSCULAR | Status: AC
  Administered 2014-07-25: 80 mg via INTRAVENOUS

## 2014-07-25 MED ORDER — ASPIRIN 81 MG PO CHEW: 324.0000 mg | CHEWABLE_TABLET | Freq: Every day | ORAL | Status: DC

## 2014-07-25 MED ORDER — FUROSEMIDE 10 MG/ML IJ SOLN
INTRAMUSCULAR | Status: DC | PRN
  Administered 2014-07-25: 80 mg via INTRAMUSCULAR

## 2014-07-25 MED ORDER — NITROGLYCERIN IN D5W 200-5 MCG/ML-% IV SOLN: 0.0000 ug/min | INTRAVENOUS | Status: DC

## 2014-07-25 MED ORDER — SODIUM CHLORIDE 0.9 % IJ SOLN
OROMUCOSAL | Status: DC | PRN
  Administered 2014-07-25 (×3): 1 mL via TOPICAL

## 2014-07-25 MED ORDER — ROCURONIUM BROMIDE 100 MG/10ML IV SOLN
INTRAVENOUS | Status: DC | PRN
  Administered 2014-07-25: 10 mg via INTRAVENOUS
  Administered 2014-07-25: 50 mg via INTRAVENOUS
  Administered 2014-07-25: 40 mg via INTRAVENOUS
  Administered 2014-07-25: 50 mg via INTRAVENOUS

## 2014-07-25 MED FILL — Lidocaine HCl IV Inj 20 MG/ML: INTRAVENOUS | Qty: 5 | Status: AC

## 2014-07-25 MED FILL — Heparin Sodium (Porcine) 2 Unit/ML in Sodium Chloride 0.9%: INTRAMUSCULAR | Qty: 500 | Status: AC

## 2014-07-25 MED FILL — Sodium Bicarbonate IV Soln 8.4%: INTRAVENOUS | Qty: 100 | Status: AC

## 2014-07-25 MED FILL — Electrolyte-R (PH 7.4) Solution: INTRAVENOUS | Qty: 4000 | Status: AC

## 2014-07-25 MED FILL — Heparin Sodium (Porcine) Inj 1000 Unit/ML: INTRAMUSCULAR | Qty: 10 | Status: AC

## 2014-07-25 MED FILL — Sodium Chloride IV Soln 0.9%: INTRAVENOUS | Qty: 3000 | Status: AC

## 2014-07-25 MED FILL — Mannitol IV Soln 20%: INTRAVENOUS | Qty: 500 | Status: AC

## 2014-07-25 SURGICAL SUPPLY — 112 items
ADAPTER CARDIO PERF ANTE/RETRO (ADAPTER) ×2 IMPLANT
ADPR PRFSN 84XANTGRD RTRGD (ADAPTER) ×1
APPLICATOR COTTON TIP 6IN STRL (MISCELLANEOUS) IMPLANT
BAG DECANTER FOR FLEXI CONT (MISCELLANEOUS) ×2 IMPLANT
BLADE CORE FAN STRYKER (BLADE) ×2 IMPLANT
BLADE OSCILLATING /SAGITTAL (BLADE) ×2 IMPLANT
BLADE STERNUM SYSTEM 6 (BLADE) ×2 IMPLANT
BLADE SURG 11 STRL SS (BLADE) ×2 IMPLANT
CANISTER SUCTION 2500CC (MISCELLANEOUS) ×2 IMPLANT
CANN PRFSN 3/8X14X24FR PCFC (MISCELLANEOUS)
CANN PRFSN 3/8XCNCT ST RT ANG (MISCELLANEOUS)
CANNULA AORTIC ROOT 9FR (CANNULA) ×2 IMPLANT
CANNULA EZ GLIDE AORTIC 21FR (CANNULA) ×4 IMPLANT
CANNULA FEM VENOUS REMOTE 22FR (CANNULA) ×2 IMPLANT
CANNULA FEMORAL ART 14 SM (MISCELLANEOUS) ×2 IMPLANT
CANNULA GUNDRY RCSP 15FR (MISCELLANEOUS) IMPLANT
CANNULA PRFSN 3/8X14X24FR PCFC (MISCELLANEOUS) IMPLANT
CANNULA PRFSN 3/8XCNCT RT ANG (MISCELLANEOUS) IMPLANT
CANNULA VEN MTL TIP RT (MISCELLANEOUS)
CATH ROBINSON RED A/P 18FR (CATHETERS) IMPLANT
CATH THORACIC 28FR RT ANG (CATHETERS) IMPLANT
CATH THORACIC 36FR (CATHETERS) IMPLANT
CLIP FOGARTY SPRING 6M (CLIP) IMPLANT
CONN 1/2X1/2X1/2  BEN (MISCELLANEOUS)
CONN 1/2X1/2X1/2 BEN (MISCELLANEOUS) IMPLANT
CONN 3/8X1/2 ST GISH (MISCELLANEOUS) IMPLANT
CONN ST 1/4X3/8  BEN (MISCELLANEOUS) ×2
CONN ST 1/4X3/8 BEN (MISCELLANEOUS) ×2 IMPLANT
CONNECTOR 1/2X3/8X1/2 3 WAY (MISCELLANEOUS) ×1
CONNECTOR 1/2X3/8X1/2 3WAY (MISCELLANEOUS) ×1 IMPLANT
CONT SPEC 4OZ CLIKSEAL STRL BL (MISCELLANEOUS) ×8 IMPLANT
COVER PROBE W GEL 5X96 (DRAPES) ×2 IMPLANT
COVER SURGICAL LIGHT HANDLE (MISCELLANEOUS) ×2 IMPLANT
CRADLE DONUT ADULT HEAD (MISCELLANEOUS) ×2 IMPLANT
DEVICE SUT CK QUICK LOAD INDV (Prosthesis & Implant Heart) ×10 IMPLANT
DEVICE SUT CK QUICK LOAD MINI (Prosthesis & Implant Heart) ×2 IMPLANT
DRAIN CHANNEL 32F RND 10.7 FF (WOUND CARE) IMPLANT
DRAPE CV SPLIT W-CLR ANES SCRN (DRAPES) ×2 IMPLANT
DRAPE INCISE IOBAN 66X45 STRL (DRAPES) ×2 IMPLANT
DRAPE INCISE IOBAN 85X60 (DRAPES) ×2 IMPLANT
DRAPE PERI GROIN 82X75IN TIB (DRAPES) ×2 IMPLANT
DRAPE SLUSH/WARMER DISC (DRAPES) IMPLANT
DRSG AQUACEL AG ADV 3.5X14 (GAUZE/BANDAGES/DRESSINGS) ×2 IMPLANT
DRSG COVADERM 4X14 (GAUZE/BANDAGES/DRESSINGS) ×2 IMPLANT
ELECT BLADE 4.0 EZ CLEAN MEGAD (MISCELLANEOUS) ×2
ELECT REM PT RETURN 9FT ADLT (ELECTROSURGICAL) ×4
ELECTRODE BLDE 4.0 EZ CLN MEGD (MISCELLANEOUS) ×1 IMPLANT
ELECTRODE REM PT RTRN 9FT ADLT (ELECTROSURGICAL) ×2 IMPLANT
GAUZE SPONGE 4X4 12PLY STRL (GAUZE/BANDAGES/DRESSINGS) ×2 IMPLANT
GLOVE BIO SURGEON STRL SZ 6 (GLOVE) IMPLANT
GLOVE BIO SURGEON STRL SZ 6.5 (GLOVE) IMPLANT
GLOVE BIO SURGEON STRL SZ7 (GLOVE) IMPLANT
GLOVE BIO SURGEON STRL SZ7.5 (GLOVE) IMPLANT
GLOVE BIOGEL M STER SZ 6 (GLOVE) ×10 IMPLANT
GLOVE BIOGEL M STRL SZ7.5 (GLOVE) ×4 IMPLANT
GLOVE BIOGEL PI IND STRL 6 (GLOVE) ×2 IMPLANT
GLOVE BIOGEL PI IND STRL 7.0 (GLOVE) ×3 IMPLANT
GLOVE BIOGEL PI INDICATOR 6 (GLOVE) ×2
GLOVE BIOGEL PI INDICATOR 7.0 (GLOVE) ×3
GLOVE ORTHO TXT STRL SZ7.5 (GLOVE) IMPLANT
GOWN STRL REUS W/ TWL LRG LVL3 (GOWN DISPOSABLE) ×8 IMPLANT
GOWN STRL REUS W/TWL LRG LVL3 (GOWN DISPOSABLE) ×8
HEMOSTAT POWDER SURGIFOAM 1G (HEMOSTASIS) ×4 IMPLANT
INSERT FOGARTY XLG (MISCELLANEOUS) ×2 IMPLANT
KIT BASIN OR (CUSTOM PROCEDURE TRAY) ×2 IMPLANT
KIT DILATOR VASC 18G NDL (KITS) ×2 IMPLANT
KIT ROOM TURNOVER OR (KITS) ×2 IMPLANT
KIT SUCTION CATH 14FR (SUCTIONS) ×2 IMPLANT
KIT SUT CK MINI COMBO 4X17 (Prosthesis & Implant Heart) ×2 IMPLANT
MARKER GRAFT CORONARY BYPASS (MISCELLANEOUS) IMPLANT
NS IRRIG 1000ML POUR BTL (IV SOLUTION) ×8 IMPLANT
PACK OPEN HEART (CUSTOM PROCEDURE TRAY) ×2 IMPLANT
PAD ARMBOARD 7.5X6 YLW CONV (MISCELLANEOUS) ×4 IMPLANT
PAD ELECT DEFIB RADIOL ZOLL (MISCELLANEOUS) ×2 IMPLANT
SET IRRIG TUBING LAPAROSCOPIC (IRRIGATION / IRRIGATOR) ×2 IMPLANT
SPONGE GAUZE 4X4 12PLY STER LF (GAUZE/BANDAGES/DRESSINGS) ×2 IMPLANT
SUCKER INTRACARDIAC WEIGHTED (SUCKER) ×2 IMPLANT
SUT BONE WAX W31G (SUTURE) IMPLANT
SUT ETHIBON 2 0 V 52N 30 (SUTURE) ×2 IMPLANT
SUT ETHIBOND 2 0 SH (SUTURE) ×8 IMPLANT
SUT ETHIBOND 2 0 SH 36X2 (SUTURE) ×2 IMPLANT
SUT ETHIBOND 2 0 V4 (SUTURE) IMPLANT
SUT ETHIBOND 2 0V4 GREEN (SUTURE) IMPLANT
SUT ETHIBOND 4 0 TF (SUTURE) IMPLANT
SUT ETHIBOND 5 0 C 1 30 (SUTURE) IMPLANT
SUT ETHIBOND X763 2 0 SH 1 (SUTURE) IMPLANT
SUT MNCRL AB 3-0 PS2 18 (SUTURE) IMPLANT
SUT PDS AB 1 CTX 36 (SUTURE) IMPLANT
SUT PROLENE 3 0 SH 1 (SUTURE) ×2 IMPLANT
SUT PROLENE 3 0 SH DA (SUTURE) ×2 IMPLANT
SUT PROLENE 4 0 RB 1 (SUTURE) ×4
SUT PROLENE 4 0 SH DA (SUTURE) ×4 IMPLANT
SUT PROLENE 4-0 RB1 .5 CRCL 36 (SUTURE) ×4 IMPLANT
SUT PROLENE 5 0 C 1 36 (SUTURE) ×2 IMPLANT
SUT SILK  1 MH (SUTURE) ×2
SUT SILK 1 MH (SUTURE) ×2 IMPLANT
SUT STEEL 6MS V (SUTURE) IMPLANT
SUT STEEL STERNAL CCS#1 18IN (SUTURE) ×2 IMPLANT
SUT STEEL SZ 6 DBL 3X14 BALL (SUTURE) ×4 IMPLANT
SUT VIC AB 2-0 CTX 27 (SUTURE) IMPLANT
SWAB COLLECTION DEVICE MRSA (MISCELLANEOUS) ×2 IMPLANT
SYSTEM SAHARA CHEST DRAIN ATS (WOUND CARE) ×4 IMPLANT
TAPE CLOTH SURG 4X10 WHT LF (GAUZE/BANDAGES/DRESSINGS) ×2 IMPLANT
TOWEL OR 17X24 6PK STRL BLUE (TOWEL DISPOSABLE) ×2 IMPLANT
TOWEL OR 17X26 10 PK STRL BLUE (TOWEL DISPOSABLE) ×2 IMPLANT
TRAY FOLEY IC TEMP SENS 14FR (CATHETERS) IMPLANT
TRAY FOLEY IC TEMP SENS 16FR (CATHETERS) ×2 IMPLANT
TUBING INSUFFLATION 10FT LAP (TUBING) IMPLANT
UNDERPAD 30X30 INCONTINENT (UNDERPADS AND DIAPERS) ×2 IMPLANT
VALVE MAGNA MITRAL 27MM (Prosthesis & Implant Heart) ×2 IMPLANT
WATER STERILE IRR 1000ML POUR (IV SOLUTION) ×4 IMPLANT
WIRE BENTSON .035X145CM (WIRE) ×2 IMPLANT

## 2014-07-25 NOTE — Transfer of Care (Signed)
Immediate Anesthesia Transfer of Care Note  Patient: [name]  Procedure(s) Performed: Procedure(s): REDO MITRAL VALVE REPLACEMENT (MVR) (N/A)  Patient Location: ICU  Anesthesia Type:General  Level of Consciousness: sedated and unresponsive  Airway & Oxygen Therapy: Patient remains intubated per anesthesia plan and Patient placed on Ventilator (see vital sign flow sheet for setting)  Post-op Assessment: Report given to RN and Post -op Vital signs reviewed and stable  Post vital signs: Reviewed and stable  Last Vitals:  Filed Vitals:   07/25/14 1431  BP: 109/65  Pulse: 88  Temp:   Resp: 16    Complications: No apparent anesthesia complications

## 2014-07-25 NOTE — Brief Op Note (Addendum)
      [address]       [place] [address]             [phone]     07/21/2014 - 07/25/2014  12:13 PM  PATIENT:  [name]  69 y.o. female  PRE-OPERATIVE DIAGNOSIS:  Prosthetic mitral valve dysfunction  POST-OPERATIVE DIAGNOSIS:  Prosthetic mitral  valve dysfunction  PROCEDURE:  Procedure(s): REDO MITRAL VALVE REPLACEMENT (MVR) #27 pericardial  Bioprosthetic Magna Mitral-ease  SURGEON:    Purcell Nails, MD  ASSISTANTS:  Rowe Clack, PA-C  ANESTHESIA:   Corky Sox, MD  CROSSCLAMP TIME:   50'  CARDIOPULMONARY BYPASS TIME: 129'  FINDINGS:  Thrombosis of mechanical mitral valve prosthesis with subacute and chronic organized thrombus  Severe mitral stenosis  Normal LV systolic funtion  Severe pulmonary hypertension  Markedly improved pulmonary artery pressures after separation from cardiopulmonary bypass  Trace central aortic insufficiency   Mitral Valve Etiology  MV Insufficiency: N/A  MV Disease: Yes.  MV Stenosis: YES (mean transvalvular gradient > 21 mmHg)  MV Disease Functional Class:   Etiology (Choose at least one and up to five): Other. Mechanical prosthetic valve thrombosis  MV Lesions (Choose at least one): Other.   Mitral Valve Procedure Performed:    Implant: Bioprosthetic Valve: Implant model number 7300tfx, Size 27, Unique Device Identifier [id number].       COMPLICATIONS: None  BASELINE WEIGHT: 82 kg  PATIENT DISPOSITION:   TO SICU IN STABLE CONDITION  Purcell Nails 07/25/2014 1:51 PM

## 2014-07-25 NOTE — Anesthesia Postprocedure Evaluation (Signed)
  Anesthesia Post-op Note  Patient: [name]  Procedure(s) Performed: Procedure(s): REDO MITRAL VALVE REPLACEMENT (MVR) (N/A)  Patient Location: ICU  Anesthesia Type:General  Level of Consciousness: sedated  Airway and Oxygen Therapy: Patient remains intubated per anesthesia plan  Post-op Pain: none  Post-op Assessment: Post-op Vital signs reviewed, Patient's Cardiovascular Status Stable, Respiratory Function Stable, No signs of Nausea or vomiting and Pain level controlled              Post-op Vital Signs: Reviewed and stable  Last Vitals:  Filed Vitals:   07/25/14 1700  BP: 92/61  Pulse: 90  Temp: 35.9 C  Resp: 0    Complications: No apparent anesthesia complications

## 2014-07-25 NOTE — Progress Notes (Signed)
Subjective: Pt intubated, appears comfortable  Objective: Vital signs in last 24 hours: Temp:  [97.5 F (36.4 C)-98.5 F (36.9 C)] 97.6 F (36.4 C) (07/18 0343) Pulse Rate:  [32-113] 112 (07/18 0438) Resp:  [0-22] 20 (07/18 0600) BP: (92-138)/(64-91) 92/74 mmHg (07/18 0600) SpO2:  [61 %-100 %] 100 % (07/18 0347) Arterial Line BP: (96-151)/(64-112) 111/68 mmHg (07/18 0600) FiO2 (%):  [40 %] 40 % (07/18 0347) Weight:  [168 lb 10.4 oz (76.5 kg)] 168 lb 10.4 oz (76.5 kg) (07/18 0442) Weight change: -12 lb 12.6 oz (-5.8 kg)  Intake/Output from previous day: 07/17 0701 - 07/18 0700 In: 1994.9 [I.V.:1994.9] Out: 305 [Urine:305] Intake/Output this shift:    Physical Exam  Constitutional: She appears well-developed and well-nourished.  Bear huggers on   HENT:  ETT  Cardiovascular: Normal rate and regular rhythm.   Pulmonary/Chest: Effort normal and breath sounds normal.  Abdominal: Soft. Bowel sounds are normal. She exhibits distension.  Musculoskeletal:  Diffuse anasarca   Skin: Skin is warm and dry.  Abdm no bs but soft, mediastinal tubes upper abdm Lung coarse bs,  CV distant Hs Lab Results:  Recent Labs  07/24/14 0438 07/25/14 0445  WBC 13.4* 13.5*  HGB 10.9* 11.4*  HCT 32.5* 34.7*  PLT 139* 165   BMET:  Recent Labs  07/24/14 0438 07/25/14 0445  NA 143 147*  K 3.5 3.7  CL 111 111  CO2 22 24  GLUCOSE 96 127*  BUN 76* 75*  CREATININE 3.85* 3.48*  CALCIUM 9.7 9.7   No results for input(s): PTH in the last 72 hours. Iron Studies: No results for input(s): IRON, TIBC, TRANSFERRIN, FERRITIN in the last 72 hours. CBG (last 3)   Recent Labs  07/24/14 1953 07/24/14 2346 07/25/14 0341  GLUCAP 87 81 77     Studies/Results: US Renal  07/23/2014   CLINICAL DATA:  Acute renal failure  EXAM: RENAL / URINARY TRACT ULTRASOUND COMPLETE  COMPARISON:  None.  FINDINGS: Right Kidney:  Length: 9.7 cm. Possible 14 x 8 x 12 mm echogenic lesion in the interpolar  region. No hydronephrosis.  Left Kidney:  Length: 10.8 cm.  No mass or hydronephrosis.  Bladder:  Decompressed by indwelling Foley catheter.  IMPRESSION: No hydronephrosis.  Possible 14 mm echogenic lesion in the interpolar right kidney, poorly evaluated. Consider follow-up CT or MR for further characterization, preferably with/without contrast after renal function improves.  Bladder is decompressed with indwelling Foley catheter.   Electronically Signed   By: Charline Bills M.D.   On: 07/23/2014 18:47   Dg Chest Port 1 View  07/24/2014   CLINICAL DATA:  69 year old female with acute respiratory failure  EXAM: PORTABLE CHEST - 1 VIEW  COMPARISON:  Prior chest x-ray 07/23/2014  FINDINGS: Patient is intubated. The tip of the ET tube is 2.9 cm above the carina. The nasogastric tube has withdrawn. The tip now overlies the mid to lower esophagus. Stable position of left IJ approach central venous catheter. Catheter tip in good position at the superior cavoatrial junction. Patient is status post median sternotomy with evidence of cardiac valve replacement. Persistent low inspiratory volumes with bilateral effusions and associated atelectasis. Nodular opacity in the right mid lung again noted. No pneumothorax. No acute osseous abnormality.  IMPRESSION: 1. The nasogastric tube has pulled back. The tip of the tube is now on the mid esophagus. 2. Other support apparatus remain in stable and satisfactory position. 3. Persistent low inspiratory volumes with bibasilar atelectasis and small effusions. 4.  Persistent nodular appearance of opacity in the right mid to upper lung. While this may reflect an incidental nodular appearance of residual atelectasis/infiltrate, a true pulmonary nodule is not excluded. If this abnormality does not resolve on additional follow-up radiographs, chest CT may be ultimately warranted for further evaluation. These results of #1 above were called by telephone at the time of interpretation on  07/24/2014 at 8:45 am to the patient's nurse, Paticia Stack, who verbally acknowledged these results.   Electronically Signed   By: Malachy Moan M.D.   On: 07/24/2014 08:46    Scheduled: . [MAR Hold] sodium chloride   Intravenous Once  . aminocaproic acid (AMICAR) for OHS   Intravenous To OR  . [MAR Hold] antiseptic oral rinse  7 mL Mouth Rinse QID  . cefUROXime (ZINACEF)  IV  1.5 g Intravenous To OR  . cefUROXime (ZINACEF)  IV  750 mg Intravenous To OR  . [MAR Hold] chlorhexidine  15 mL Mouth Rinse BID  . dexmedetomidine  0.1-0.7 mcg/kg/hr Intravenous To OR  . DOPamine  0-10 mcg/kg/min Intravenous To OR  . epinephrine  0-10 mcg/min Intravenous To OR  . [MAR Hold] famotidine  20 mg Per Tube Daily  . heparin-papaverine-plasmalyte irrigation   Irrigation To OR  . heparin 30,000 units/NS 1000 mL solution for CELLSAVER   Other To OR  . [MAR Hold] insulin aspart  2-6 Units Subcutaneous 6 times per day  . insulin (NOVOLIN-R) infusion   Intravenous To OR  . magnesium sulfate  40 mEq Other To OR  . nitroGLYCERIN  2-200 mcg/min Intravenous To OR  . phenylephrine (NEO-SYNEPHRINE) Adult infusion  30-200 mcg/min Intravenous To OR  . potassium chloride  80 mEq Other To OR  . [MAR Hold] sodium chloride  3 mL Intravenous Q12H  . vancomycin 1000 mg in NS (1000 ml) irrigation for Dr. Cornelius Moras case   Irrigation To OR    Assessment/Plan: 1. AKI/CKD- oliguric. Unclear baseline Scr. Has h/o kidney disease as a child but had normal Scr in 2009. Likely related to acute decompensated CHF related to cardiogenic shock and valvular dysfunction in setting of Ace-Inhibitor  1. FeNa is low likely pre renal due to poor perfusion from decreased cardiac output 2. Renal US without obstruction 3. Cardiac cath yesterday revealed severe mitral stenosis w/ pulmonary HTN, 4ml of contrast used, cont IVF, Cr down 3.48 from 3.85 yesterday, will continue to monitor chem panel 4. Will follow potassium and acid base  status 5. Daily weights, unclear amt of IVFs patient received during valve replacement today, appears volume overloaded 2. Cardiogenic shock due to acute onset mitral stenosis due to prosthetic valve dysfunction 1. cardiac cath yesterday revealed and MVR (redo) per Dr. Cornelius Moras this afternoon 2. Continue with pressors for now and supportive care 3. H/o MVR now with mitral stenosis - CT surgery following, proceeding with MVR today 4. Metabolic acidosis due to #1 resolved with isotonic bicarb gtt. On NS at 250cc/hr 5. SIRS- on pressors, cont vanc and cefuroxime 6. VDRF- per PCCM 7. Abnormal LFT's- likely 2/2 hypotension from PEA arrest and cardiogenic shock. trending down, will cont to follow.  8. Leukocytosis- WBC 16.1 previously 13.4 on 7/17, likely reactive from sx    LOS: 4 days   Gara Kroner 07/25/2014,8:41 AM I have seen and examined this patient and agree with the plan of care seen, eval, examined.  Vol appears ok Will follow K and acid/base closely .  Charlotte Brafford L 07/25/2014, 9:13 PM

## 2014-07-25 NOTE — OR Nursing (Signed)
Calls made to the SICU Charge RN at [phone], [phone], and [phone]. Patient to go to room 5.

## 2014-07-25 NOTE — Anesthesia Preprocedure Evaluation (Signed)
Anesthesia Evaluation  Patient identified by MRN, date of birth, ID band Patient awake    Reviewed: Allergy & Precautions, Unable to perform ROS - Chart review only  Airway Mallampati: Intubated       Dental   Pulmonary former smoker,  breath sounds clear to auscultation        Cardiovascular hypertension, Pt. on medications +CHF + Valvular Problems/Murmurs Rhythm:Regular  MS s/p MVR with acute CHF   Neuro/Psych    GI/Hepatic negative GI ROS, Neg liver ROS,   Endo/Other    Renal/GU ARFRenal disease     Musculoskeletal   Abdominal   Peds  Hematology   Anesthesia Other Findings   Reproductive/Obstetrics                             Anesthesia Physical Anesthesia Plan  ASA: IV  Anesthesia Plan: General   Post-op Pain Management:    Induction: Intravenous  Airway Management Planned: Oral ETT  Additional Equipment: CVP, Arterial line, TEE, PA Cath and Ultrasound Guidance Line Placement  Intra-op Plan:   Post-operative Plan: Post-operative intubation/ventilation  Informed Consent: I have reviewed the patients History and Physical, chart, labs and discussed the procedure including the risks, benefits and alternatives for the proposed anesthesia with the patient or authorized representative who has indicated his/her understanding and acceptance.   Dental advisory given  Plan Discussed with: CRNA and Surgeon  Anesthesia Plan Comments:         Anesthesia Quick Evaluation

## 2014-07-25 NOTE — Anesthesia Postprocedure Evaluation (Signed)
  Anesthesia Post-op Note  Patient: [name]  Procedure(s) Performed: Procedure(s): REDO MITRAL VALVE REPLACEMENT (MVR) (N/A)  Patient Location: ICU  Anesthesia Type:General  Level of Consciousness: sedated and unresponsive  Airway and Oxygen Therapy: Patient remains intubated per anesthesia plan and Patient placed on Ventilator (see vital sign flow sheet for setting)  Post-op Pain: none  Post-op Assessment: Post-op Vital signs reviewed, Patient's Cardiovascular Status Stable, Respiratory Function Stable and Patent Airway              Post-op Vital Signs: Reviewed and stable  Last Vitals:  Filed Vitals:   07/25/14 1431  BP: 109/65  Pulse: 88  Temp:   Resp: 16    Complications: No apparent anesthesia complications

## 2014-07-25 NOTE — Progress Notes (Signed)
Patient ID: [name], female   DOB: 02/14/1945, 69 y.o.   MRN: [id number] EVENING ROUNDS NOTE :     [address]       [place] [address]             [phone]                 Day of Surgery Procedure(s) (LRB): REDO MITRAL VALVE REPLACEMENT (MVR) (N/A)  Total Length of Stay:  LOS: 4 days  BP 92/61 mmHg  Pulse 90  Temp(Src) 96.6 F (35.9 C) (Core (Comment))  Resp 0  Ht 5\' 6"  (1.676 m)  Wt 168 lb 10.4 oz (76.5 kg)  BMI 27.23 kg/m2  SpO2 100%  .Intake/Output      07/17 0701 - 07/18 0700 07/18 0701 - 07/19 0700   I.V. (mL/kg) 1994.9 (26.1) 1913 (25)   Blood  1691   IV Piggyback  600   Total Intake(mL/kg) 1994.9 (26.1) 4204 (55)   Urine (mL/kg/hr) 305 (0.2) 1285 (1.6)   Blood  1530 (1.9)   Chest Tube  440 (0.6)   Total Output 305 3255   Net +1689.9 +949          . sodium chloride 20 mL/hr at 07/25/14 1700  . [START ON 07/26/2014] sodium chloride    . sodium chloride 20 mL/hr at 07/25/14 1700  . sodium chloride 100 mL/hr at 07/25/14 1700  . dexmedetomidine Stopped (07/25/14 1630)  . DOPamine 3 mcg/kg/min (07/25/14 1700)  . insulin (NOVOLIN-R) infusion Stopped (07/25/14 1700)  . lactated ringers    . lactated ringers    . milrinone 0.3 mcg/kg/min (07/25/14 1700)  . nitroGLYCERIN Stopped (07/25/14 1530)  . phenylephrine (NEO-SYNEPHRINE) Adult infusion 10 mcg/min (07/25/14 1700)     Lab Results  Component Value Date   WBC 16.1* 07/25/2014   HGB 9.5* 07/25/2014   HGB 9.2* 07/25/2014   HCT 28.0* 07/25/2014   HCT 27.2* 07/25/2014   PLT 144* 07/25/2014   GLUCOSE 119* 07/25/2014   CHOL 169 02/17/2013   TRIG 99 02/17/2013   HDL 50 02/17/2013   LDLCALC 99 02/17/2013   ALT 304* 07/25/2014   AST 77* 07/25/2014   NA 147* 07/25/2014   K 3.3* 07/25/2014   CL 110 07/25/2014   CREATININE 2.90* 07/25/2014   BUN 66* 07/25/2014   CO2 24 07/25/2014   TSH 1.770 *Test methodology is 3rd generation TSH* 01/17/2007   INR 2.07* 07/25/2014   HGBA1C   01/13/2007    5.8 (NOTE)   The ADA recommends the following therapeutic goals for glycemic   control related to Hgb A1C measurement:   Goal of Therapy:   < 7.0% Hgb A1C   Action Suggested:  > 8.0% Hgb A1C   Ref:  Diabetes Care, 22, Suppl. 1, 1999   Sedate on vent, initial  ct output now  decreased  CI now 1.4    Delight Ovens MD  Beeper [phone] Office [phone] 07/25/2014 5:27 PM

## 2014-07-25 NOTE — Progress Notes (Signed)
RT note- Patient taken to the OR with anesthesia for surgery. Will be returning to the Surgical unit post OP.

## 2014-07-25 NOTE — Op Note (Signed)
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 Cornelius Moras, MD  Assistant: Rowe Clack, PA-C  Anesthesia: Corky Sox, MD  Operative Findings:  Thrombosis of mechanical mitral valve prosthesis with acute, subacute and chronic organized thrombus  Severe mitral stenosis  Normal LV systolic funtion  Severe pulmonary hypertension  Trace central aortic insufficiency  Markedly improved pulmonary artery pressures after separation from cardiopulmonary bypass        BRIEF CLINICAL NOTE AND INDICATIONS FOR SURGERY  Patient is a 69 year old African-American female with history of hypertension who underwent mitral valve replacement using a 29 mm [employer] bileaflet mechanical valve in 2000 for bacterial endocarditis complicated by severe mitral regurgitation. She recovered uneventfully. She was followed for several years by Dr. Elsie Lincoln and for a brief period of time was noncompliant with Coumadin therapy. She did not suffer any significant complications at that time. More recently she has been compliant with Coumadin therapy and followed by Dr. Rennis Golden who saw her most recently on 03/11/2014 at which time she was reportedly doing very well. She presented acutely to the emergency department on the afternoon of 07/21/2014 with a several day history of feeling poorly with nonspecific complaints including abdominal discomfort, shortness of breath, and fatigue. She was found to be profoundly hypotensive in the emergency department with lactic acidosis. She suffered  2 PEA arrests totaling a proximally 5 minutes in duration. She was intubated and resuscitated with volume and vasoactive agents, and admitted to the intensive care unit. She has been treated with broad-spectrum antibiotics. INR was 2.55 at the time of presentation. Transthoracic echocardiogram demonstrated normal left ventricular size and systolic function with ejection fraction 55-60%. Motion of the leaflets of the mechanical valve in the mitral position appeared restricted and there appeared to be elevation of diastolic gradients suggesting the possibility of prosthetic valve dysfunction. Transesophageal echocardiogram was performed demonstrating restricted leaflet mobility particularly involving the posterior leaflet of the bileaflet mechanical prosthesis. There was not significant mitral regurgitation. Fluoroscopy was performed also confirming the presence of prosthetic valve dysfunction with restricted leaflet mobility. Cardiothoracic surgical consultation was requested.  The patient has been seen in consultation and counseled at length regarding the indications, risks and potential benefits of surgery with her entire family present.  All questions have been answered, and the patient and her family provide full informed consent for the operation as described.    DETAILS OF THE OPERATIVE PROCEDURE  Preparation:  The patient is brought to the operating room on the above mentioned date and central monitoring was established by the anesthesia team including placement of Swan-Ganz catheter and radial arterial line. Attempts to float the patient's Swan-Ganz catheter through the right ventricle into the pulmonary artery were unsuccessful and the tip of the catheter was left located in the superior vena cava to measure central venous pressure initially.  The patient is placed in the supine position on the operating table.  Intravenous antibiotics are administered. General endotracheal anesthesia is induced  uneventfully. A Foley catheter is placed.  Baseline transesophageal echocardiogram was performed.  Findings were notable for severe dysfunction of the bileaflet prosthetic mechanical valve in  the mitral position.  1 of the 2 leaflets of the valve was completely fixed and immobile in the closed position. The other leaflet displayed severely restricted leaflet mobility. There was severe mitral stenosis with mean transvalvular gradient measured greater than 21 mmHg. Left ventricular systolic function appeared normal. There was signs consistent with severe pulmonary hypertension. There was normal right ventricular size and function but moderate to severe tricuspid regurgitation. There was trace central aortic insufficiency.  The patient's chest, abdomen, both groins, and both lower extremities are prepared and draped in a sterile manner. A time out procedure is performed.   Surgical Approach:  A redo median sternotomy incision was performed.  All of the previous sternal wires are removed. The sternum was divided with an oscillating saw. Sternal reentry is uneventful. The underside of the sternum is dissected away from the structures of the mediastinum in both directions until both the left and right pleural spaces are entered. There are large serous bilateral pleural effusions which are evacuated.  Dissection is begun anteriorly and the ascending aorta is identified and dissected away from associated structures.  The ascending aorta is normal in appearance.   Extracorporeal Cardiopulmonary Bypass and Myocardial Protection:  The right internal jugular vein is cannulated with Seldinger technique and a guidewire advanced into the right atrium.  The right common femoral vein is cannulated using the Seldinger technique and a guidewire advanced into the right atrium using TEE guidance.  The patient is heparinized systemically and the femoral vein cannulated using a 22 French long femoral venous cannula. The  right internal jugular vein is cannulated using a 14 French pediatric femoral venous cannula. The ascending aorta is cannulated for cardiopulmonary bypass.  Adequate heparinization is verified.     The entire pre-bypass portion of the operation was notable for stable hemodynamics.  Cardiopulmonary bypass was commenced. Vacuum assist venous drainage is utilized. Sharp dissection is utilized to dissect the right atrium away from the free margin of the pericardium. Dissection is continued until the intra-atrial groove is dissected. The entire ascending aorta is dissected and separated from the pulmonary artery.  A retrograde cardioplegia cannula is placed through the right atrium into the coronary sinus.  A cardioplegia cannula is placed in the ascending aorta.    The patient is cooled to 32C systemic temperature.  The aortic cross clamp is applied and cold blood cardioplegia is delivered initially in an antegrade fashion through the aortic root.  Supplemental cardioplegia is given retrograde through the coronary sinus catheter.  Iced saline slush is applied for topical hypothermia.  The initial cardioplegic arrest is rapid with early diastolic arrest.  Repeat doses of cardioplegia are administered intermittently throughout the entire cross clamp portion of the operation through the aortic root and through the coronary sinus catheter in order to maintain completely flat electrocardiogram.  Myocardial protection was felt to be excellent.   Redo Mitral Valve Replacement:  A left atriotomy incision is performed posteriorly through the intra-atrial groove and extended part way across the back wall of the left atrium after opening the oblique sinus inferiorly. A self-retaining retractor is placed. Exposures felt to be excellent.  The mitral valve was inspected and notable for obvious thrombosis of the valve. There is a large amount of clot adherent to the atrial surface. Some of the clot appears fresh but much of  it appears subacute and chronic with some organized thrombus present. A portion of the clot is sent for routine culture and sensitivity. The remainder is removed and  sent for histology. All of the clot on the atrial surface of the valve is removed. One can appreciate a large amount of subacute and chronic thrombus  Within the left ventricular chamber as well. At this point a decision is made to remove the mechanical prosthesis and replace it using a bioprosthetic tissue valve.   The old mechanical mitral valve prosthesis is removed using sharp dissection. Dissection is technically straightforward. Once the valve is then removed all clot from the ventricular chamber is removed.  The left ventricle was irrigated with copious cold saline solution.  The mitral annulus was sized to accept a 27 100 mm prosthesis.    Mitral valve replacement was performed using interrupted horizontal mattress 2-0 Ethibond pledgeted sutures with pledgets in the supraannular position.   An [facility] Mitral bovine bioprosthetic tissue valve (size 27 mm, model # 7300TFX, serial # [id number]) was implanted uneventfully. The valve seated appropriately with care to position the commissure posts away from the left ventricular outflow tract.  Rewarming is begun.  The atriotomy was closed using a 2-layer closure of running 3-0 Prolene suture after placing a sump drain across the mitral valve to serve as a left ventricular vent.  One final dose of warm retrograde "hot shot" cardioplegia was administered retrograde through the coronary sinus catheter while all air was evacuated through the aortic root.  The aortic cross clamp was removed after a total cross clamp time of 87 minutes.   Procedure Completion:  Epicardial pacing wires are fixed to the right ventricular outflow tract and to the right atrial appendage. The patient is rewarmed to 37C temperature. The aortic and left ventricular vents are removed.  The patient is weaned and  disconnected from cardiopulmonary bypass.  The patient's rhythm at separation from bypass was AV paced.  The patient was weaned from cardioplegic bypass on low dose milrinone and dopamine infusions. Total cardiopulmonary bypass time for the operation was 129 minutes.  Followup transesophageal echocardiogram performed after separation from bypass revealed a well-seated mitral valve prosthesis that was functioning normally and without any sign of perivalvular leak.  Left ventricular function was unchanged from preoperatively.  The remaining trace central aortic insufficiency. There was trace tricuspid regurgitation. After separation from bypass the patient's Swan-Ganz catheter was floated through the right ventricle into the pulmonary artery without difficulty.  The aortic cannula was removed uneventfully. Protamine was administered to reverse the anticoagulation. The right internal jugular and femoral venous cannulae were removed and manual pressure held on the neck and groin for 30 minutes.  The mediastinum and pleural space were inspected for hemostasis and irrigated with saline solution. The mediastinum and both pleural spaces were drained using 4 chest tubes placed through separate stab incisions inferiorly.  The soft tissues anterior to the aorta were reapproximated loosely. The sternum is closed with double strength sternal wire. The soft tissues anterior to the sternum were closed in multiple layers and the skin is closed with a running subcuticular skin closure.  The post-bypass portion of the operation was notable for stable rhythm and hemodynamics.  Pulmonary artery pressures were notably dramatically reduced postoperatively.  The patient received 1 unit packed red blood cells during the procedure due to anemia which was present preoperatively and exacerbated by acute blood loss and hemodilution during cardiopulmonary bypass.  The patient received a total of 2 packs adult platelets and 2 units  fresh frozen plasma due to coagulopathy and thrombocytopenia after separation from cardiopulmonary bypass and reversal of heparin with protamine.  Patient Disposition:  The patient tolerated the procedure well and is transported to the surgical intensive care in stable condition. There are no intraoperative complications. All sponge instrument and needle counts are verified correct at completion of the operation.     Salvatore Decent. Cornelius Moras MD 07/25/2014 1:58 PM

## 2014-07-25 NOTE — Anesthesia Procedure Notes (Signed)
Procedures

## 2014-07-26 ENCOUNTER — Encounter (HOSPITAL_COMMUNITY): Payer: Self-pay | Admitting: Thoracic Surgery (Cardiothoracic Vascular Surgery)

## 2014-07-26 ENCOUNTER — Inpatient Hospital Stay (HOSPITAL_COMMUNITY): Payer: Medicare Other

## 2014-07-26 DIAGNOSIS — T82867A Thrombosis of cardiac prosthetic devices, implants and grafts, initial encounter: Secondary | ICD-10-CM

## 2014-07-26 DIAGNOSIS — T8201XA Breakdown (mechanical) of heart valve prosthesis, initial encounter: Principal | ICD-10-CM

## 2014-07-26 DIAGNOSIS — Z953 Presence of xenogenic heart valve: Secondary | ICD-10-CM

## 2014-07-26 DIAGNOSIS — I97110 Postprocedural cardiac insufficiency following cardiac surgery: Secondary | ICD-10-CM

## 2014-07-26 LAB — POCT I-STAT 3, ART BLOOD GAS (G3+)
Acid-base deficit: 1 mmol/L (ref 0.0–2.0)
Bicarbonate: 24.4 mEq/L — ABNORMAL HIGH (ref 20.0–24.0)
Bicarbonate: 24.2 mEq/L — ABNORMAL HIGH (ref 20.0–24.0)
O2 Saturation: 99 %
O2 Saturation: 99 %
PCO2 ART: 39.2 mmHg (ref 35.0–45.0)
PH ART: 7.404 (ref 7.350–7.450)
PO2 ART: 116 mmHg — AB (ref 80.0–100.0)
Patient temperature: 37
TCO2: 26 mmol/L (ref 0–100)
TCO2: 25 mmol/L (ref 0–100)
pCO2 arterial: 39.1 mmHg (ref 35.0–45.0)
pH, Arterial: 7.396 (ref 7.350–7.450)
pO2, Arterial: 158 mmHg — ABNORMAL HIGH (ref 80.0–100.0)

## 2014-07-26 LAB — CBC
HCT: 24.5 % — ABNORMAL LOW (ref 36.0–46.0)
HEMATOCRIT: 27.3 % — AB (ref 36.0–46.0)
Hemoglobin: 8.5 g/dL — ABNORMAL LOW (ref 12.0–15.0)
Hemoglobin: 9.4 g/dL — ABNORMAL LOW (ref 12.0–15.0)
MCH: 29.5 pg (ref 26.0–34.0)
MCH: 29.7 pg (ref 26.0–34.0)
MCHC: 34.7 g/dL (ref 30.0–36.0)
MCHC: 34.4 g/dL (ref 30.0–36.0)
MCV: 85.1 fL (ref 78.0–100.0)
MCV: 86.1 fL (ref 78.0–100.0)
PLATELETS: 102 10*3/uL — AB (ref 150–400)
Platelets: 102 10*3/uL — ABNORMAL LOW (ref 150–400)
RBC: 2.88 MIL/uL — ABNORMAL LOW (ref 3.87–5.11)
RBC: 3.17 MIL/uL — AB (ref 3.87–5.11)
RDW: 13.4 % (ref 11.5–15.5)
RDW: 13.5 % (ref 11.5–15.5)
WBC: 10.5 10*3/uL (ref 4.0–10.5)
WBC: 14.4 10*3/uL — ABNORMAL HIGH (ref 4.0–10.5)

## 2014-07-26 LAB — TYPE AND SCREEN
ABO/RH(D): B POS
ANTIBODY SCREEN: NEGATIVE
UNIT DIVISION: 0
Unit division: 0
Unit division: 0
Unit division: 0
Unit division: 0
Unit division: 0

## 2014-07-26 LAB — BASIC METABOLIC PANEL
Anion gap: 8 (ref 5–15)
BUN: 65 mg/dL — ABNORMAL HIGH (ref 6–20)
CHLORIDE: 113 mmol/L — AB (ref 101–111)
CO2: 23 mmol/L (ref 22–32)
Calcium: 8.4 mg/dL — ABNORMAL LOW (ref 8.9–10.3)
Creatinine, Ser: 3.17 mg/dL — ABNORMAL HIGH (ref 0.44–1.00)
GFR calc Af Amer: 16 mL/min — ABNORMAL LOW (ref >60)
GFR calc non Af Amer: 14 mL/min — ABNORMAL LOW (ref >60)
GLUCOSE: 125 mg/dL — AB (ref 65–99)
POTASSIUM: 3 mmol/L — AB (ref 3.5–5.1)
Sodium: 144 mmol/L (ref 135–145)

## 2014-07-26 LAB — POCT I-STAT, CHEM 8
BUN: 58 mg/dL — AB (ref 6–20)
CHLORIDE: 109 mmol/L (ref 101–111)
Calcium, Ion: 1.39 mmol/L — ABNORMAL HIGH (ref 1.13–1.30)
Creatinine, Ser: 3.1 mg/dL — ABNORMAL HIGH (ref 0.44–1.00)
GLUCOSE: 121 mg/dL — AB (ref 65–99)
HCT: 28 % — ABNORMAL LOW (ref 36.0–46.0)
HEMOGLOBIN: 9.5 g/dL — AB (ref 12.0–15.0)
POTASSIUM: 3.3 mmol/L — AB (ref 3.5–5.1)
SODIUM: 149 mmol/L — AB (ref 135–145)
TCO2: 23 mmol/L (ref 0–100)

## 2014-07-26 LAB — PREPARE FRESH FROZEN PLASMA
UNIT DIVISION: 0
UNIT DIVISION: 0
Unit division: 0
Unit division: 0

## 2014-07-26 LAB — CULTURE, BLOOD (ROUTINE X 2)
CULTURE: NO GROWTH
Culture: NO GROWTH

## 2014-07-26 LAB — GLUCOSE, CAPILLARY
GLUCOSE-CAPILLARY: 87 mg/dL (ref 65–99)
GLUCOSE-CAPILLARY: 90 mg/dL (ref 65–99)
Glucose-Capillary: 124 mg/dL — ABNORMAL HIGH (ref 65–99)
Glucose-Capillary: 126 mg/dL — ABNORMAL HIGH (ref 65–99)
Glucose-Capillary: 132 mg/dL — ABNORMAL HIGH (ref 65–99)
Glucose-Capillary: 152 mg/dL — ABNORMAL HIGH (ref 65–99)

## 2014-07-26 LAB — CREATININE, SERUM
CREATININE: 3.12 mg/dL — AB (ref 0.44–1.00)
GFR calc Af Amer: 17 mL/min — ABNORMAL LOW (ref >60)
GFR, EST NON AFRICAN AMERICAN: 15 mL/min — AB (ref >60)

## 2014-07-26 LAB — PREPARE PLATELET PHERESIS
Unit division: 0
Unit division: 0

## 2014-07-26 LAB — MAGNESIUM
MAGNESIUM: 1.3 mg/dL — AB (ref 1.7–2.4)
Magnesium: 1.3 mg/dL — ABNORMAL LOW (ref 1.7–2.4)

## 2014-07-26 MED ORDER — MORPHINE SULFATE 2 MG/ML IJ SOLN: 1.0000 mg | INTRAMUSCULAR | Status: DC | PRN

## 2014-07-26 MED ORDER — FUROSEMIDE 10 MG/ML IJ SOLN
120.0000 mg | Freq: Once | INTRAVENOUS | Status: AC
  Administered 2014-07-26: 120 mg via INTRAVENOUS
  Filled 2014-07-26: qty 12

## 2014-07-26 MED ORDER — MILRINONE IN DEXTROSE 20 MG/100ML IV SOLN
0.2000 ug/kg/min | INTRAVENOUS | Status: DC
Start: 2014-07-26 — End: 2014-07-27
  Administered 2014-07-26: 0.2 ug/kg/min via INTRAVENOUS
  Filled 2014-07-26: qty 100

## 2014-07-26 MED ORDER — POTASSIUM CHLORIDE 10 MEQ/50ML IV SOLN
10.0000 meq | INTRAVENOUS | Status: AC
  Administered 2014-07-26 (×3): 10 meq via INTRAVENOUS

## 2014-07-26 MED ORDER — POTASSIUM CHLORIDE 10 MEQ/50ML IV SOLN
10.0000 meq | INTRAVENOUS | Status: AC
  Administered 2014-07-26 (×2): 10 meq via INTRAVENOUS
  Filled 2014-07-26 (×3): qty 50

## 2014-07-26 MED FILL — Potassium Chloride Inj 2 mEq/ML: INTRAVENOUS | Qty: 40 | Status: AC

## 2014-07-26 MED FILL — Heparin Sodium (Porcine) Inj 1000 Unit/ML: INTRAMUSCULAR | Qty: 30 | Status: AC

## 2014-07-26 MED FILL — Magnesium Sulfate Inj 50%: INTRAMUSCULAR | Qty: 10 | Status: AC

## 2014-07-26 NOTE — Progress Notes (Signed)
[address]       [place] [address]             [phone]        CARDIOTHORACIC SURGERY PROGRESS NOTE   R1 Day Post-Op Procedure(s) (LRB): REDO MITRAL VALVE REPLACEMENT (MVR) (N/A)  Subjective: Very sleepy on vent.  Opens eyes, moves all 4 extremities and follows some simple commands, but very drowsy - too sleepy to consider vent wean and extubation at this point  Objective: Vital signs: BP Readings from Last 1 Encounters:  07/26/14 88/46   Pulse Readings from Last 1 Encounters:  07/26/14 90   Resp Readings from Last 1 Encounters:  07/26/14 18   Temp Readings from Last 1 Encounters:  07/26/14 98.4 F (36.9 C)     Hemodynamics: PAP: (41-61)/(27-33) 57/28 mmHg CO:  [2.6 L/min-4.1 L/min] 4.1 L/min CI:  [1.4 L/min/m2-2.2 L/min/m2] 2.2 L/min/m2  Physical Exam:  Rhythm:   Sinus - AAI paced  Breath sounds: clear  Heart sounds:  RRR w/out murmur  Incisions:  Dressing dry, intact  Abdomen:  Soft, non-distended, non-tender  Extremities:  Warm, well-perfused  Chest tubes:  Low volume thin serosanguinous output, no air leak    Intake/Output from previous day: 07/18 0701 - 07/19 0700 In: 6814.7 [I.V.:3303.7; Blood:2601; NG/GT:90; IV Piggyback:700] Out: 5030 [Urine:2420; Emesis/NG output:200; Blood:1530; Chest Tube:880] Intake/Output this shift:    Lab Results:  CBC: Recent Labs  07/25/14 2130 07/25/14 2135 07/26/14 0500  WBC 11.1*  --  10.5  HGB 9.0* 8.8* 8.5*  HCT 26.5* 26.0* 24.5*  PLT 106*  --  102*    BMET:  Recent Labs  07/25/14 0445  07/25/14 2135 07/26/14 0500  NA 147*  < > 148* 144  K 3.7  < > 3.6 3.0*  CL 111  < > 110 113*  CO2 24  --   --  23  GLUCOSE 127*  < > 132* 125*  BUN 75*  < > 63* 65*  CREATININE 3.48*  < > 3.20* 3.17*  CALCIUM 9.7  --   --  8.4*  < > = values in this interval not displayed.   PT/INR:   Recent Labs  07/25/14 1430  LABPROT 23.1*  INR 2.07*    CBG (last 3)   Recent Labs  07/25/14 1916 07/25/14 2355 07/26/14 0416  GLUCAP 116* 152* 126*    ABG    Component Value Date/Time   PHART 7.404 07/26/2014 0510   PCO2ART 39.1 07/26/2014 0510   PO2ART 158.0* 07/26/2014 0510   HCO3 24.4* 07/26/2014 0510   TCO2 26 07/26/2014 0510   ACIDBASEDEF 3.0* 07/25/2014 1430   O2SAT 99.0 07/26/2014 0510    CXR: PORTABLE CHEST - 1 VIEW  COMPARISON: 07/25/2014.  FINDINGS: Endotracheal to, NG tube, bilateral chest tubes in stable position. Swan-Ganz catheter is in stable position looped in the main pulmonary artery. Prior cardiac valve replacement. Stable cardiomegaly. Low lung volumes with persistent bibasilar atelectasis. Tiny left pleural effusion. No pneumothorax.  IMPRESSION: 1. Lines and tubes in stable position including bilateral chest tubes. Swan-Ganz catheter is in stable position with its tip looped in the main pulmonary artery. 2. Prior cardiac valve replacement. Stable cardiomegaly. No pulmonary venous congestion. 3. Low lung volumes with persistent bibasilar atelectasis. Tiny left pleural effusion.   Electronically Signed  By: Maisie Fus Register  On: 07/26/2014 07:21  Assessment/Plan: S/P Procedure(s) (LRB): REDO MITRAL VALVE REPLACEMENT (MVR) (N/A)  Overall stable POD1 Maintaining NSR w/ stable  hemodynamics on low dose milrinone and dopamine Pre-op acute mitral stenosis with cardiogenic shock due to prosthetic valve thrombosis - resolved Still too sleepy for vent wean/extubation, but appears to be neurologically intact Acute on chronic renal insufficiency, non-oliguric Expected post op acute blood loss anemia, Hgb 8.5 this morning Expected post op volume excess Hypokalemia Longstanding hypertension   Try 1 dose lasix to stimulate UOP  Wean vent once more alert  Purcell Nails 07/26/2014 7:55 AM

## 2014-07-26 NOTE — Progress Notes (Signed)
*  PRELIMINARY RESULTS* Echocardiogram Echocardiogram Transesophageal has been performed.  Jeryl Columbia 07/26/2014, 7:39 AM

## 2014-07-26 NOTE — Progress Notes (Signed)
Subjective: Pt intubated, appears comfortable  Objective: Vital signs in last 24 hours: Temp:  [95.5 F (35.3 C)-98.6 F (37 C)] 98.4 F (36.9 C) (07/19 0605) Pulse Rate:  [87-92] 90 (07/19 0605) Resp:  [0-25] 18 (07/19 0605) BP: (85-115)/(45-73) 88/46 mmHg (07/19 0600) SpO2:  [95 %-100 %] 99 % (07/19 0605) Arterial Line BP: (48-91)/(41-69) 54/44 mmHg (07/19 0605) FiO2 (%):  [50 %] 50 % (07/19 0312) Weight:  [197 lb 15.6 oz (89.8 kg)] 197 lb 15.6 oz (89.8 kg) (07/19 0600) Weight change: 29 lb 5.1 oz (13.3 kg)  Intake/Output from previous day: 07/18 0701 - 07/19 0700 In: 6814.7 [I.V.:3303.7; Blood:2601; NG/GT:90; IV Piggyback:700] Out: 5030 [Urine:2420; Emesis/NG output:200; Blood:1530; Chest Tube:880] Intake/Output this shift:    Physical Exam  Constitutional: She appears well-developed and well-nourished.     HENT:  ETT  Cardiovascular: Normal rate and regular rhythm.   Pulmonary/Chest: Effort normal and breath sounds normal.  Abdominal: Soft. She exhibits no distension.  Hypoactive bowel sounds   Musculoskeletal:  Diffuse anasarca   Skin: Skin is warm and dry.  Crisp valve sounds 3+ periph edema diffusely Lab Results:  Recent Labs  07/25/14 2130 07/25/14 2135 07/26/14 0500  WBC 11.1*  --  10.5  HGB 9.0* 8.8* 8.5*  HCT 26.5* 26.0* 24.5*  PLT 106*  --  102*   BMET:  Recent Labs  07/25/14 0445  07/25/14 2135 07/26/14 0500  NA 147*  < > 148* 144  K 3.7  < > 3.6 3.0*  CL 111  < > 110 113*  CO2 24  --   --  23  GLUCOSE 127*  < > 132* 125*  BUN 75*  < > 63* 65*  CREATININE 3.48*  < > 3.20* 3.17*  CALCIUM 9.7  --   --  8.4*  < > = values in this interval not displayed. No results for input(s): PTH in the last 72 hours. Iron Studies: No results for input(s): IRON, TIBC, TRANSFERRIN, FERRITIN in the last 72 hours. CBG (last 3)   Recent Labs  07/25/14 1916 07/25/14 2355 07/26/14 0416  GLUCAP 116* 152* 126*     Studies/Results: Dg Chest Port 1  View  07/26/2014   CLINICAL DATA:  Atelectasis.  Intubation.  EXAM: PORTABLE CHEST - 1 VIEW  COMPARISON:  07/25/2014.  FINDINGS: Endotracheal to, NG tube, bilateral chest tubes in stable position. Swan-Ganz catheter is in stable position looped in the main pulmonary artery. Prior cardiac valve replacement. Stable cardiomegaly. Low lung volumes with persistent bibasilar atelectasis. Tiny left pleural effusion. No pneumothorax.  IMPRESSION: 1. Lines and tubes in stable position including bilateral chest tubes. Swan-Ganz catheter is in stable position with its tip looped in the main pulmonary artery. 2. Prior cardiac valve replacement. Stable cardiomegaly. No pulmonary venous congestion. 3. Low lung volumes with persistent bibasilar atelectasis. Tiny left pleural effusion.   Electronically Signed   By: Maisie Fus  Register   On: 07/26/2014 07:21   Dg Chest Port 1 View  07/25/2014   CLINICAL DATA:  Atelectasis.  Redo of mitral valve replacement.  EXAM: PORTABLE CHEST - 1 VIEW  COMPARISON:  07/24/2014 and 07/23/2014  FINDINGS: There is a new mitral valve prosthesis. Endotracheal tube and NG tube and bilateral chest tubes are in place. Swan-Ganz catheter tip appears looped in the main pulmonary artery.  No pneumothorax. Bibasilar atelectasis and small left effusion. Pulmonary edema has resolved in the right perihilar region.  IMPRESSION: 1. Swan-Ganz catheter appears looped in the main pulmonary  artery. 2. No pneumothorax. 3. At improved aeration on the right. 4. Persistent small effusion on the left.   Electronically Signed   By: Francene Boyers M.D.   On: 07/25/2014 15:06    Scheduled: . sodium chloride   Intravenous Once  . sodium chloride   Intravenous Once  . acetaminophen  1,000 mg Oral 4 times per day   Or  . acetaminophen (TYLENOL) oral liquid 160 mg/5 mL  1,000 mg Per Tube 4 times per day  . antiseptic oral rinse  7 mL Mouth Rinse QID  . aspirin EC  325 mg Oral Daily   Or  . aspirin  324 mg Per Tube  Daily  . bisacodyl  10 mg Oral Daily   Or  . bisacodyl  10 mg Rectal Daily  . cefUROXime (ZINACEF)  IV  1.5 g Intravenous Q12H  . chlorhexidine  15 mL Mouth Rinse BID  . docusate sodium  200 mg Oral Daily  . famotidine (PEPCID) IV  20 mg Intravenous Q24H  . insulin aspart  0-24 Units Subcutaneous 6 times per day  . magnesium sulfate  4 g Intravenous Once  . metoprolol tartrate  12.5 mg Oral BID   Or  . metoprolol tartrate  12.5 mg Per Tube BID  . [START ON 07/27/2014] pantoprazole  40 mg Oral Daily  . sodium chloride  3 mL Intravenous Q12H    Assessment/Plan: 1. AKI/CKD- oliguric. Unclear baseline Scr. Likely related to acute decompensated CHF in setting of cardiogenic shock and valvular dysfunction along with Ace-Inhibitor use 1. FeNa is low likely pre renal due to poor perfusion from decreased cardiac output 2. Renal US without obstruction 3. Cardiac cath 7/17 revealed severe mitral stenosis w/ pulmonary HTN, 87ml of contrast used, cont IVF, Cr trending down 4. Will follow potassium and acid base status, K repleted w/ KCl x 6 runs Renal function plateaued.  Needs to diurese. Do slowly in post op state. 2. Daily weights. CXR this am reveals pulmonary congestion. Still has anasarca. Received 120mg  Lasix this morning per  3. Cardiogenic shock due to acute onset mitral stenosis due to prosthetic valve dysfunction 1. Day 1 s/p mitral valve replacement w/ Dr. Barry Dienes 2. Continue with pressors for now and supportive care 4. H/o MVR with acute mitral stenosis on admission - resolved s/p mitral valve replacement 5. Metabolic acidosis due to #1 resolved with isotonic bicarb gtt.  6. SIRS- on pressors and cefuroxime 7. VDRF- per PCCM 7.   Abnormal LFT's- likely 2/2 hypotension from PEA arrest and cardiogenic shock. CMET in the am  8.   Leukocytosis- resolving   LOS: 5 days   Gara Kroner I have seen and examined this patient and agree with the plan of care seen, examined, discussed  with residents, and nursing staff. .  Journiee Feldkamp L 07/26/2014, 4:42 PM  07/26/2014,7:59 AM

## 2014-07-26 NOTE — Progress Notes (Signed)
CRITICAL VALUE ALERT  Critical value received:  K 3.3; creat 3.1  Date of notification:  07/26/2014  Time of notification:  1615  Critical value read back:yes  Nurse who received alert:  Aline August RN MD notified (1st page):  Dr Cornelius Moras  Time of first page:  1620  MD notified (2nd page):  Time of second page:  Responding MD:  Dr. Cornelius Moras  Time MD responded:  [phone]

## 2014-07-26 NOTE — Progress Notes (Signed)
RT started vent wean process @0857  with a rate of 4 and 02 of 40%. @0917  patient was placed on PS;CPAP. ABG results are WNL. NIF of -30. VC .8liters. Patient still very sleepy. Dr. Cornelius Moras coming to access patient to determine if she will be extubated. RN aware. RT will continue to monitor.

## 2014-07-26 NOTE — Procedures (Signed)
Extubation Procedure Note  Patient Details:   Name: [name] DOB: 1945/10/14 MRN: [id number]   Airway Documentation:     Evaluation  O2 sats: stable throughout Complications: No apparent complications Patient did tolerate procedure well. Bilateral Breath Sounds: Clear, Diminished Suctioning: Oral, Airway Yes   Patient extubated to 4lnc per MD order. Patient tolerated well. No complications. Vital signs stable throughout. RN at bedside. RT will continue to monitor.  Ave Filter 07/26/2014, 11:13 AM

## 2014-07-26 NOTE — Progress Notes (Signed)
      [address]       [place] [address]             [phone]      Resting comfortably  Some mild confusion  BP 130/67 mmHg  Pulse 60  Temp(Src) 97.7 F (36.5 C) (Oral)  Resp 19  Ht 5\' 6"  (1.676 m)  Wt 197 lb 15.6 oz (89.8 kg)  BMI 31.97 kg/m2  SpO2 99%   Intake/Output Summary (Last 24 hours) at 07/26/14 1721 Last data filed at 07/26/14 1700  Gross per 24 hour  Intake 3214.29 ml  Output   3580 ml  Net -365.71 ml   Creatinine stable, K being replenished  UO has picked up today  Viviann Spare C. Dorris Fetch, MD [facility] [phone]

## 2014-07-26 NOTE — Progress Notes (Signed)
TELEMETRY: Reviewed telemetry pt in atrial paced rhythm, underlying junctional rhythm.: Filed Vitals:   07/26/14 0500 07/26/14 0600 07/26/14 0605 07/26/14 0805  BP: 85/45 88/46  107/54  Pulse: 90 90 90 90  Temp: 98.6 F (37 C) 98.4 F (36.9 C) 98.4 F (36.9 C)   TempSrc:      Resp: Height:      Weight:  89.8 kg (197 lb 15.6 oz)    SpO2: 99% 99% 99% 100%    Intake/Output Summary (Last 24 hours) at 07/26/14 0821 Last data filed at 07/26/14 0600  Gross per 24 hour  Intake 6814.73 ml  Output   5030 ml  Net 1784.73 ml   Filed Weights   07/24/14 0500 07/25/14 0442 07/26/14 0600  Weight: 82.3 kg (181 lb 7 oz) 76.5 kg (168 lb 10.4 oz) 89.8 kg (197 lb 15.6 oz)    Subjective Sedated and intubated.  . sodium chloride   Intravenous Once  . sodium chloride   Intravenous Once  . acetaminophen  1,000 mg Oral 4 times per day   Or  . acetaminophen (TYLENOL) oral liquid 160 mg/5 mL  1,000 mg Per Tube 4 times per day  . antiseptic oral rinse  7 mL Mouth Rinse QID  . aspirin EC  325 mg Oral Daily   Or  . aspirin  324 mg Per Tube Daily  . bisacodyl  10 mg Oral Daily   Or  . bisacodyl  10 mg Rectal Daily  . cefUROXime (ZINACEF)  IV  1.5 g Intravenous Q12H  . chlorhexidine  15 mL Mouth Rinse BID  . docusate sodium  200 mg Oral Daily  . famotidine (PEPCID) IV  20 mg Intravenous Q24H  . furosemide  120 mg Intravenous Once  . insulin aspart  0-24 Units Subcutaneous 6 times per day  . magnesium sulfate  4 g Intravenous Once  . metoprolol tartrate  12.5 mg Oral BID   Or  . metoprolol tartrate  12.5 mg Per Tube BID  . potassium chloride  10 mEq Intravenous Q1 Hr x 3  . sodium chloride  3 mL Intravenous Q12H   . sodium chloride 20 mL/hr at 07/26/14 0600  . sodium chloride    . sodium chloride 250 mL (07/26/14 0600)  . sodium chloride 20 mL/hr at 07/26/14 0600  . dexmedetomidine Stopped (07/25/14 1630)  . DOPamine 4 mcg/kg/min (07/26/14 0600)  . lactated ringers    .  lactated ringers    . milrinone 0.3 mcg/kg/min (07/26/14 0600)  . phenylephrine (NEO-SYNEPHRINE) Adult infusion Stopped (07/26/14 0000)    LABS: Basic Metabolic Panel:  Recent Labs  09/81/19 0438 07/25/14 0445  07/25/14 2130 07/25/14 2135 07/26/14 0500  NA 143 147*  < >  --  148* 144  K 3.5 3.7  < >  --  3.6 3.0*  CL 111 111  < >  --  110 113*  CO2 22 24  --   --   --  23  GLUCOSE 96 127*  < >  --  132* 125*  BUN 76* 75*  < >  --  63* 65*  CREATININE 3.85* 3.48*  < > 3.27* 3.20* 3.17*  CALCIUM 9.7 9.7  --   --   --  8.4*  MG 1.3* 1.4*  --  1.5*  --  1.3*  PHOS 3.4 4.2  --   --   --   --   < > = values in  this interval not displayed. Liver Function Tests:  Recent Labs  07/24/14 0438 07/25/14 0445  AST 173* 77*  ALT 436* 304*  ALKPHOS 55 57  BILITOT 1.6* 1.4*  PROT 5.5* 5.6*  ALBUMIN 2.6*  2.6* 2.8*  2.8*   No results for input(s): LIPASE, AMYLASE in the last 72 hours. CBC:  Recent Labs  07/25/14 2130 07/25/14 2135 07/26/14 0500  WBC 11.1*  --  10.5  HGB 9.0* 8.8* 8.5*  HCT 26.5* 26.0* 24.5*  MCV 85.8  --  85.1  PLT 106*  --  102*   Cardiac Enzymes: No results for input(s): CKTOTAL, CKMB, CKMBINDEX, TROPONINI in the last 72 hours. BNP: No results for input(s): PROBNP in the last 72 hours. D-Dimer: No results for input(s): DDIMER in the last 72 hours. Hemoglobin A1C: No results for input(s): HGBA1C in the last 72 hours. Fasting Lipid Panel: No results for input(s): CHOL, HDL, LDLCALC, TRIG, CHOLHDL, LDLDIRECT in the last 72 hours. Thyroid Function Tests: No results for input(s): TSH, T4TOTAL, T3FREE, THYROIDAB in the last 72 hours.  Invalid input(s): FREET3   Radiology/Studies:  Dg Chest Port 1 View  07/26/2014   CLINICAL DATA:  Atelectasis.  Intubation.  EXAM: PORTABLE CHEST - 1 VIEW  COMPARISON:  07/25/2014.  FINDINGS: Endotracheal to, NG tube, bilateral chest tubes in stable position. Swan-Ganz catheter is in stable position looped in the main  pulmonary artery. Prior cardiac valve replacement. Stable cardiomegaly. Low lung volumes with persistent bibasilar atelectasis. Tiny left pleural effusion. No pneumothorax.  IMPRESSION: 1. Lines and tubes in stable position including bilateral chest tubes. Swan-Ganz catheter is in stable position with its tip looped in the main pulmonary artery. 2. Prior cardiac valve replacement. Stable cardiomegaly. No pulmonary venous congestion. 3. Low lung volumes with persistent bibasilar atelectasis. Tiny left pleural effusion.   Electronically Signed   By: Maisie Fus  Register   On: 07/26/2014 07:21   Dg Chest Port 1 View  07/25/2014   CLINICAL DATA:  Atelectasis.  Redo of mitral valve replacement.  EXAM: PORTABLE CHEST - 1 VIEW  COMPARISON:  07/24/2014 and 07/23/2014  FINDINGS: There is a new mitral valve prosthesis. Endotracheal tube and NG tube and bilateral chest tubes are in place. Swan-Ganz catheter tip appears looped in the main pulmonary artery.  No pneumothorax. Bibasilar atelectasis and small left effusion. Pulmonary edema has resolved in the right perihilar region.  IMPRESSION: 1. Swan-Ganz catheter appears looped in the main pulmonary artery. 2. No pneumothorax. 3. At improved aeration on the right. 4. Persistent small effusion on the left.   Electronically Signed   By: Francene Boyers M.D.   On: 07/25/2014 15:06   Ecg today shows junctional rhythm with rate 54 bpm. RBBB.  PHYSICAL EXAM General: Well developed, well nourished,unresponsive on vent Head: Normal Neck: Negative for carotid bruits. JVD not elevated. No adenopathy Lungs: Coarse BS.  Heart: RRR S1 S2 without murmurs, rubs, or gallops.  Abdomen: Soft, non-tender, non-distended with normoactive bowel sounds.  Extremities: 1+ edema.  Distal pedal pulses are 2+ and equal bilaterally. Neuro: sedated   ASSESSMENT AND PLAN: 1. Post op day #1 redo MVR with bioprosthetic valve. Patient presented with cardiogenic shock secondary to severe mitral  stenosis related to thrombosis of mechanical MV. Remains on pressors with Dopamine and Milrinone. Atrial paced. Good urine output. 2. Acute renal failure. Creatinine improved post op and good urine output. 3. Hypomagnesemia- replete 4. Hypokalemia- replete 5. Coagulopathy s/p transfusion of FFP 6. Acute respiratory failure 7. transaminitis-  improved.   Present on Admission:  . Shock . (Resolved) Septic shock . HTN (hypertension) . RBBB . Acute respiratory failure, unspecified whether with hypoxia or hypercapnia . Metabolic acidosis . Prosthetic valve dysfunction . Thrombosis of prosthetic heart valve  Signed, Peter [place], MDFACC 07/26/2014 8:21 AM

## 2014-07-27 ENCOUNTER — Encounter (HOSPITAL_COMMUNITY): Payer: Self-pay | Admitting: *Deleted

## 2014-07-27 ENCOUNTER — Inpatient Hospital Stay (HOSPITAL_COMMUNITY): Payer: Medicare Other

## 2014-07-27 DIAGNOSIS — J810 Acute pulmonary edema: Secondary | ICD-10-CM

## 2014-07-27 DIAGNOSIS — R0902 Hypoxemia: Secondary | ICD-10-CM

## 2014-07-27 LAB — WOUND CULTURE
CULTURE: NO GROWTH
Culture: NO GROWTH

## 2014-07-27 LAB — CBC
HCT: 28.1 % — ABNORMAL LOW (ref 36.0–46.0)
HEMOGLOBIN: 9.4 g/dL — AB (ref 12.0–15.0)
MCH: 29.2 pg (ref 26.0–34.0)
MCHC: 33.5 g/dL (ref 30.0–36.0)
MCV: 87.3 fL (ref 78.0–100.0)
PLATELETS: 111 10*3/uL — AB (ref 150–400)
RBC: 3.22 MIL/uL — AB (ref 3.87–5.11)
RDW: 13.8 % (ref 11.5–15.5)
WBC: 15.4 10*3/uL — ABNORMAL HIGH (ref 4.0–10.5)

## 2014-07-27 LAB — COMPREHENSIVE METABOLIC PANEL
ALT: 77 U/L — AB (ref 14–54)
AST: 45 U/L — ABNORMAL HIGH (ref 15–41)
Albumin: 2.8 g/dL — ABNORMAL LOW (ref 3.5–5.0)
Alkaline Phosphatase: 46 U/L (ref 38–126)
Anion gap: 8 (ref 5–15)
BILIRUBIN TOTAL: 1.4 mg/dL — AB (ref 0.3–1.2)
BUN: 60 mg/dL — AB (ref 6–20)
CALCIUM: 9.7 mg/dL (ref 8.9–10.3)
CO2: 27 mmol/L (ref 22–32)
Chloride: 113 mmol/L — ABNORMAL HIGH (ref 101–111)
Creatinine, Ser: 3.03 mg/dL — ABNORMAL HIGH (ref 0.44–1.00)
GFR, EST AFRICAN AMERICAN: 17 mL/min — AB (ref >60)
GFR, EST NON AFRICAN AMERICAN: 15 mL/min — AB (ref >60)
GLUCOSE: 105 mg/dL — AB (ref 65–99)
POTASSIUM: 3.5 mmol/L (ref 3.5–5.1)
Sodium: 148 mmol/L — ABNORMAL HIGH (ref 135–145)
TOTAL PROTEIN: 5.6 g/dL — AB (ref 6.5–8.1)

## 2014-07-27 LAB — GLUCOSE, CAPILLARY
GLUCOSE-CAPILLARY: 106 mg/dL — AB (ref 65–99)
GLUCOSE-CAPILLARY: 154 mg/dL — AB (ref 65–99)
GLUCOSE-CAPILLARY: 175 mg/dL — AB (ref 65–99)
GLUCOSE-CAPILLARY: 220 mg/dL — AB (ref 65–99)
GLUCOSE-CAPILLARY: 90 mg/dL (ref 65–99)
Glucose-Capillary: 89 mg/dL (ref 65–99)

## 2014-07-27 LAB — APTT: aPTT: 40 seconds — ABNORMAL HIGH (ref 24–37)

## 2014-07-27 LAB — PROTIME-INR
INR: 2.02 — ABNORMAL HIGH (ref 0.00–1.49)
Prothrombin Time: 22.8 seconds — ABNORMAL HIGH (ref 11.6–15.2)

## 2014-07-27 MED ORDER — POTASSIUM CHLORIDE 10 MEQ/50ML IV SOLN
10.0000 meq | INTRAVENOUS | Status: AC
  Administered 2014-07-27 (×3): 10 meq via INTRAVENOUS
  Filled 2014-07-27 (×3): qty 50

## 2014-07-27 MED ORDER — WARFARIN SODIUM 2.5 MG PO TABS
2.5000 mg | ORAL_TABLET | Freq: Every day | ORAL | Status: DC
  Administered 2014-07-27 – 2014-07-28 (×2): 2.5 mg via ORAL
  Filled 2014-07-27 (×3): qty 1

## 2014-07-27 MED ORDER — WARFARIN - PHYSICIAN DOSING INPATIENT: Freq: Every day | Status: DC

## 2014-07-27 MED ORDER — PANTOPRAZOLE SODIUM 40 MG PO TBEC
40.0000 mg | DELAYED_RELEASE_TABLET | Freq: Every day | ORAL | Status: DC
  Administered 2014-07-27 – 2014-08-03 (×8): 40 mg via ORAL
  Filled 2014-07-27 (×8): qty 1

## 2014-07-27 MED ORDER — AMIODARONE LOAD VIA INFUSION
150.0000 mg | Freq: Once | INTRAVENOUS | Status: AC
  Administered 2014-07-27: 150 mg via INTRAVENOUS
  Filled 2014-07-27: qty 83.34

## 2014-07-27 MED ORDER — FUROSEMIDE 10 MG/ML IJ SOLN
80.0000 mg | Freq: Three times a day (TID) | INTRAMUSCULAR | Status: AC
  Administered 2014-07-27 – 2014-07-28 (×3): 80 mg via INTRAVENOUS
  Filled 2014-07-27 (×3): qty 8

## 2014-07-27 MED ORDER — INSULIN ASPART 100 UNIT/ML ~~LOC~~ SOLN
0.0000 [IU] | SUBCUTANEOUS | Status: DC
  Administered 2014-07-27: 4 [IU] via SUBCUTANEOUS

## 2014-07-27 MED ORDER — CETYLPYRIDINIUM CHLORIDE 0.05 % MT LIQD
7.0000 mL | Freq: Two times a day (BID) | OROMUCOSAL | Status: DC
  Administered 2014-07-27 – 2014-08-02 (×12): 7 mL via OROMUCOSAL

## 2014-07-27 MED ORDER — DEXTROSE 5 % IV SOLN
INTRAVENOUS | Status: DC
  Administered 2014-07-27: 10 mL via INTRAVENOUS

## 2014-07-27 MED ORDER — AMIODARONE HCL IN DEXTROSE 360-4.14 MG/200ML-% IV SOLN
30.0000 mg/h | INTRAVENOUS | Status: AC
  Administered 2014-07-27: 30 mg/h via INTRAVENOUS
  Filled 2014-07-27 (×3): qty 200

## 2014-07-27 MED ORDER — AMIODARONE HCL IN DEXTROSE 360-4.14 MG/200ML-% IV SOLN
INTRAVENOUS | Status: AC
  Filled 2014-07-27: qty 200

## 2014-07-27 MED ORDER — AMIODARONE HCL IN DEXTROSE 360-4.14 MG/200ML-% IV SOLN
60.0000 mg/h | INTRAVENOUS | Status: AC
  Administered 2014-07-27: 60 mg/h via INTRAVENOUS
  Filled 2014-07-27: qty 200

## 2014-07-27 NOTE — Consult Note (Signed)
PULMONARY / CRITICAL CARE MEDICINE   Name: Darrelyn Hillock MRN: [id number] DOB: [birth year]    ADMISSION DATE:  08-20-2014  REFERRING MD :  Bonney Roussel EDP  CHIEF COMPLAINT:  Shock  INITIAL PRESENTATION: 69 year old female presented to ED 2022/08/20 with AMS. In ED was profoundly hypotensive with elevated lactic, and unfortunately suffered 2 PEA arrests totaling approximately 5 minutes in duration. Post arrest she is in refractory shock despite 4L IVF resuscitation and 2 vasoactive agents. PCCM to admit.  STUDIES:  Echo 08-20-22 > EF 55-60%. Mild aortic regurg. Mechanical MVR. Questionable disc malfunction. RV mildly dilated. PA peak pressure . Cardiac cath 7/17 >> no CAD Echo 7/20 >>>   SIGNIFICANT EVENTS: 2022/08/20 coded in ED, PEA arrest 6 mins. PCCM admit to ICU intubated. 7/18 - to OR for redo of MVR. 7/19 - extubated. 7/20 - hypoxia requiring NRB, PCCM reconsulted.   HISTORY OF PRESENT ILLNESS:   Ces-Arine E Reola Calkins is a 68y.o. F with PMH as outliend below.  She was initially admitted by [facility] 08/20/2022 to [facility] after suffering 2 PEA arrests in ED followed by refractory shock despite 4L IVF and 2 vasoactive agents.  Initially presentation felt to be septic shock; however, later determined to more likely be hypovolemic as well as cardiogenic due to mitral valve replacement dysfunction (prosthetic MV thrombosis).  She was evaluated by [facility] (Dr. Barry Dienes) who took her to the OR on 7/18 for redo of her MVR.  She tolerated the procedure well and was extubated the following day.  On 7/20, she developed new onset a.fib that AM and later that PM had acute episode of SOB.  She was placed on NRB and PCCM was re-consulted.   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  has past surgical history that includes Mitral valve replacement (04/11/1998); Tubal ligation; transthoracic echocardiogram (11/2009); Total abdominal hysterectomy w/ bilateral salpingoophorectomy (12/14/1998); Cardiac catheterization (N/A, 07/24/2014); Cardiac catheterization (N/A, 07/22/2014); and Mitral valve replacement (N/A, 07/25/2014). Prior to Admission medications   Medication Sig Start Date End Date Taking? Authorizing Provider  carvedilol (COREG) 6.25 MG tablet Take 1 tablet (6.25 mg total) by mouth 2 (two) times daily with a meal. 03/11/14  Yes Chrystie Nose, MD  lisinopril (PRINIVIL,ZESTRIL) 20 MG tablet Take 1 tablet (20 mg total) by mouth daily. 03/11/14  Yes Chrystie Nose, MD  simvastatin (ZOCOR) 40 MG tablet Take 1 tablet (40 mg total) by mouth daily. 03/11/14  Yes Chrystie Nose, MD  triamterene-hydrochlorothiazide (DYAZIDE) 37.5-25 MG per capsule Take 1 each (1 capsule total) by mouth daily. 03/11/14  Yes Chrystie Nose, MD  warfarin (COUMADIN) 3 MG tablet TAKE ONE TO ONE & ONE-HALF TABLETS BY MOUTH ONCE DAILY AS DIRECTED 12/24/13   Chrystie Nose, MD   Allergies  Allergen Reactions  . Augmentin [Amoxicillin-Pot Clavulanate]     FAMILY HISTORY:  Family History  Problem Relation Age of Onset  . Lung cancer Mother   . Heart failure Maternal Grandmother   . Heart failure Maternal Grandfather     SOCIAL HISTORY:  reports that she quit smoking about 41 years ago. She has never used smokeless tobacco. She reports that she does not drink alcohol or use illicit drugs.  REVIEW OF SYSTEMS:  All negative; except for those that  are bolded, which indicate positives.  Constitutional: weight loss, weight gain, night sweats, fevers, chills, fatigue, weakness.  HEENT: headaches, sore throat, sneezing, nasal congestion, post nasal drip, difficulty swallowing, tooth/dental problems, visual complaints, visual changes, ear aches. Neuro: difficulty with  speech, weakness, numbness, ataxia. CV:  chest pain, orthopnea, PND, swelling in lower extremities, dizziness, palpitations, syncope.  Resp: cough, hemoptysis, dyspnea - improving, wheezing. GI  heartburn, indigestion, abdominal pain, nausea, vomiting, diarrhea, constipation, change in bowel habits, loss of appetite, hematemesis, melena, hematochezia.  GU: dysuria, change in color of urine, urgency or frequency, flank pain, hematuria. MSK: joint pain or swelling, decreased range of motion. Psych: change in mood or affect, depression, anxiety, suicidal ideations, homicidal ideations. Skin: rash, itching, bruising.   SUBJECTIVE: Episode of acute hypoxia, placed on NRB with improvement in sats to high 90's.  VITAL SIGNS: Temp:  [97.6 F (36.4 C)-98.3 F (36.8 C)] 98.3 F (36.8 C) (07/20 1544) Pulse Rate:  [25-99] 69 (07/20 1700) Resp:  [0-26] 26 (07/20 1700) BP: (119-164)/(65-93) 164/93 mmHg (07/20 1700) SpO2:  [85 %-100 %] 94 % (07/20 1700) Weight:  [81.4 kg (179 lb 7.3 oz)] 81.4 kg (179 lb 7.3 oz) (07/20 0600) VENTILATOR SETTINGS:   INTAKE / OUTPUT:  Intake/Output Summary (Last 24 hours) at 07/27/14 1809 Last data filed at 07/27/14 1600  Gross per 24 hour  Intake 1836.65 ml  Output   2510 ml  Net -673.35 ml    PHYSICAL EXAMINATION: General: pleasant adult female, in NAD. Neuro: moves all extremities, no focal deficits. HEENT: [place]/AT.  MMM. Cardiovascular:  RRR, no M/R/G.  Paced rhythm. Lungs: Crackles bilaterally R > L. Abdomen: Soft, non tender. Musculoskeletal: No edema. Skin: No rash.  LABS:  CBC  Recent Labs Lab 07/26/14 0500 07/26/14 1605 07/26/14 1607 07/27/14 0415  WBC 10.5 14.4*  --  15.4*  HGB 8.5* 9.4* 9.5* 9.4*  HCT 24.5* 27.3* 28.0* 28.1*  PLT 102* 102*  --  111*   Coag's  Recent Labs Lab 07/24/14 0438 07/25/14 0445 07/25/14 0652 07/25/14 1430  APTT 174*  --  68* 39*  INR 1.51* 1.36  --  2.07*   BMET  Recent Labs Lab 07/25/14 0445   07/26/14 0500 07/26/14 1605 07/26/14 1607 07/27/14 0415  NA 147*  < > 144  --  149* 148*  K 3.7  < > 3.0*  --  3.3* 3.5  CL 111  < > 113*  --  109 113*  CO2 24  --  23  --   --  27  BUN 75*  < > 65*  --  58* 60*  CREATININE 3.48*  < > 3.17* 3.12* 3.10* 3.03*  GLUCOSE 127*  < > 125*  --  121* 105*  < > = values in this interval not displayed. Electrolytes  Recent Labs Lab 07/23/14 0500 07/24/14 0438 07/25/14 0445 07/25/14 2130 07/26/14 0500 07/26/14 1605 07/27/14 0415  CALCIUM 10.0 9.7 9.7  --  8.4*  --  9.7  MG 1.2* 1.3* 1.4* 1.5* 1.3* 1.3*  --   PHOS 3.1 3.4 4.2  --   --   --   --      Sepsis Markers  Recent Labs Lab 07/21/14 1436  07/21/14 2059 07/22/14 0400 07/22/14 0430 07/22/14 0701 07/23/14 0500  LATICACIDVEN  --   < > 6.1*  --  3.5* 2.9*  --   PROCALCITON 0.31  --   --  2.90  --   --  1.92  < > =  values in this interval not displayed.   ABG  Recent Labs Lab 07/25/14 1430 07/26/14 0510 07/26/14 0946  PHART 7.417 7.404 7.396  PCO2ART 33.5* 39.1 39.2  PO2ART 69.0* 158.0* 116.0*   Liver Enzymes  Recent Labs Lab 07/24/14 0438 07/25/14 0445 07/27/14 0415  AST 173* 77* 45*  ALT 436* 304* 77*  ALKPHOS 55 57 46  BILITOT 1.6* 1.4* 1.4*  ALBUMIN 2.6*  2.6* 2.8*  2.8* 2.8*   Cardiac Enzymes  Recent Labs Lab 07/21/14 1436 07/21/14 2036 07/22/14 0130  TROPONINI 1.33* 1.28* 1.55*   Glucose  Recent Labs Lab 07/26/14 1930 07/26/14 2334 07/27/14 0406 07/27/14 0728 07/27/14 1203 07/27/14 1543  GLUCAP 87 90 89 90 220* 154*    Imaging Dg Chest Port 1 View  07/27/2014   CLINICAL DATA:  69 year old female with shortness of breath  EXAM: PORTABLE CHEST - 1 VIEW  COMPARISON:  Earlier radiograph dated 07/27/2014  FINDINGS: There has been interval removal of the mediastinal drain. Two thoracic chest tubes are in stable position. There new postsurgical changes of mitral valve replacement. There has been interval decrease in the size of the  mediastinal opacity compared to the prior study. Bibasilar atelectatic changes again noted. Median sternotomy wires.  IMPRESSION: Interval removal of mediastinal drain with interval decrease in the mediastinal opacity.   Electronically Signed   By: Elgie Collard M.D.   On: 07/27/2014 17:42   Dg Chest Port 1 View  07/27/2014   CLINICAL DATA:  Chest tubes  EXAM: PORTABLE CHEST - 1 VIEW  COMPARISON:  Yesterday  FINDINGS: Interval tracheal and esophageal extubation. Swan-Ganz catheter from the left IJ sheath has been removed. Thoracic drains remain in similar position.  When accounting for differences in projection, stable cardiopericardial enlargement in this patient status post recent mitral valve replacement.  Bibasilar atelectasis and pleural effusions are stable to slightly increased. No pulmonary edema or pneumothorax.  IMPRESSION: 1. Stable positioning of remaining tubes and central line. 2. Similar bibasilar atelectasis with small effusions.   Electronically Signed   By: Marnee Spring M.D.   On: 07/27/2014 07:23     ASSESSMENT / PLAN:  PULMONARY ETT 7/14 >>> 7/19 A: Acute hypoxic respiratory failure in setting of mitral valve malfunction, CHF. Pulmonary edema. Atelectasis. ? PE. P:   Continue supplemental O2 as needed to maintain SpO2 > 92%. Lasix as BP and SCr permit. Incentive spirometry / pulmonary hygiene. F/u LE duplex. NTS suctioning. CXR in AM.  CARDIOVASCULAR Left IJ CVC 7/14 >>> A:  Mitral Valve thrombosis s/p MRV redo 7/18 (Dr. Barry Dienes). Shock >> more likely from mitral valve dysfx >> off pressors 7/16. A.fib - new onset 7/20. Hypervolemia. Hx of HTN, HLD. P:  Post op care per [facility]. Continue amiodarone, lasix, coumadin.  RENAL A:   Acute Renal Failure. Hypernatremia. Lactic Acidosis - Resolved. Hypokalemia. P:   Aggressive diuresis as BP and SCr permit. Monitor I/O's. Correct electrolytes as indicated. Nephrology following. BMP in  AM.  GASTROINTESTINAL A:   Acute Liver Injury 2nd to shock >> improving. Nutrition. P:   F/u LFT intermittently. Liquid diet.  HEMATOLOGIC A:   Coagulopathy - secondary to Coumadin; now resolved. Anemia - post op from blood loss. Thrombocytopenia. P:  Continue warfarin. Transfuse for Hgb < 7. Monitor platelet counts. CBC in AM.  INFECTIOUS A:   Initial concern for septic shock - more likely hypovolemia and mitral valve dysfx.   P:   No role abx. Monitor clinically.  ENDOCRINE A:  Mild hyperglycemia. P:   SSI if glucose consistently > 180.  NEUROLOGIC A:   Acute metabolic encephalopathy - improved. Post op pain. P:   Pain control per primary.   Family Updates:  None at bedside.   Rutherford Guys, [place] [facility] [place] [facility] Pager: [phone]  or [phone] 07/27/2014, 6:21 PM  Attending Note:  69 year old female well known to our service, recent valve replacement, was extubated, however on 7/20 developed severe hypoxemia and respiratory failure requiring 100% NRB.  PCCM was called on consultation.  CXR reviewed and reveals pulmonary edema and bilateral pleural effusion.  DDx is acute pulmonary edema, PE (much less likely) or PNA (much less likely).  Will order high dose lasix but renal function will be an issue.  Lower ext dopplers ordered as well.  Supplemental O2 as above.  Will make BiPAP available as PRN for patient comfort.  No need for intubation for now.  Hold in the ICU for closer monitoring.  Note for services rendered 07/27/2014.  The patient is critically ill with multiple organ systems failure and requires high complexity decision making for assessment and support, frequent evaluation and titration of therapies, application of advanced monitoring technologies and extensive interpretation of multiple databases.   Critical Care Time devoted to patient care services described in this note is  35  Minutes. This time  reflects time of care of this signee Dr Koren Bound. This critical care time does not reflect procedure time, or teaching time or supervisory time of PA/NP/Med student/Med Resident etc but could involve care discussion time.  Alyson Reedy, M.D. [facility] Pulmonary/Critical Care Medicine. Pager: [phone]. After hours pager: [phone].

## 2014-07-27 NOTE — Care Management Important Message (Signed)
Important Message  Patient Details  Name: [name] MRN: [id number] Date of Birth: 1945/08/23   Medicare Important Message Given:  [facility] notification given    Kyla Balzarine 07/27/2014, 10:47 AMImportant Message  Patient Details  Name: [name] MRN: [id number] Date of Birth: [birth year]   Medicare Important Message Given:  Yes-second notification given    Kyla Balzarine 07/27/2014, 10:47 AM

## 2014-07-27 NOTE — Progress Notes (Signed)
Bilateral lower extremity venous duplex completed:  No evidence of DVT, superficial thrombosis, or Baker's cyst.   

## 2014-07-27 NOTE — Progress Notes (Signed)
eLink Physician-Brief Progress Note Patient Name: [name] DOB: [birth year] MRN: [id number]   Date of Service  07/27/2014  HPI/Events of Note  Acute hypoxia - Patient Awake, hypertensive, & in no pain on NRB breathing comfortably.  eICU Interventions  Stat venous duplex, PT, & PTT.      Intervention Category Major Interventions: Hypoxemia - evaluation and management  Lawanda Cousins 07/27/2014, 5:43 PM

## 2014-07-27 NOTE — Care Management Note (Signed)
Case Management Note  Patient Details  Name: Darrelyn Hillock MRN: [id number] Date of Birth: 1945-10-11  Subjective/Objective:       Pt OOB to chair this a.m., lives alone but adult dtr plans to stay with pt 24/7 when she is discharged.                       Expected Discharge Plan:  [facility] Care  Discharge planning Services  CM Consult  Mirian Casco, [place], [place] 07/27/2014, 2:01 PM

## 2014-07-27 NOTE — Progress Notes (Signed)
[address]       [place] [address]             [phone]        CARDIOTHORACIC SURGERY PROGRESS NOTE   R2 Days Post-Op Procedure(s) (LRB): REDO MITRAL VALVE REPLACEMENT (MVR) (N/A)  Subjective: Looks good.  Awake and alert.  Denies pain, SOB  Objective: Vital signs: BP Readings from Last 1 Encounters:  07/27/14 138/75   Pulse Readings from Last 1 Encounters:  07/27/14 99   Resp Readings from Last 1 Encounters:  07/27/14 21   Temp Readings from Last 1 Encounters:  07/27/14 97.6 F (36.4 C) Oral    Hemodynamics: PAP: (56-63)/(25-33) 56/25 mmHg  Physical Exam:  Rhythm:   Afib w/ HR 90-110  Breath sounds: clear  Heart sounds:  irregular  Incisions:  Dressing dry, intact  Abdomen:  Soft, non-distended, non-tender  Extremities:  Warm, well-perfused  Chest tubes:  Low volume thin serosanguinous output, no air leak    Intake/Output from previous day: 07/19 0701 - 07/20 0700 In: 1107.5 [I.V.:757.5; IV Piggyback:350] Out: 3450 [Urine:2770; Stool:100; Chest Tube:580] Intake/Output this shift: Total I/O In: 698.6 [P.O.:600; I.V.:48.6; IV Piggyback:50] Out: 230 [Urine:130; Chest Tube:100]  Lab Results:  CBC: Recent Labs  07/26/14 1605 07/26/14 1607 07/27/14 0415  WBC 14.4*  --  15.4*  HGB 9.4* 9.5* 9.4*  HCT 27.3* 28.0* 28.1*  PLT 102*  --  111*    BMET:  Recent Labs  07/26/14 0500  07/26/14 1607 07/27/14 0415  NA 144  --  149* 148*  K 3.0*  --  3.3* 3.5  CL 113*  --  109 113*  CO2 23  --   --  27  GLUCOSE 125*  --  121* 105*  BUN 65*  --  58* 60*  CREATININE 3.17*  < > 3.10* 3.03*  CALCIUM 8.4*  --   --  9.7  < > = values in this interval not displayed.   PT/INR:   Recent Labs  07/25/14 1430  LABPROT 23.1*  INR 2.07*    CBG (last 3)   Recent Labs  07/26/14 2334 07/27/14 0406 07/27/14 0728  GLUCAP 90 89 90    ABG    Component Value Date/Time   PHART 7.396 07/26/2014 0946   PCO2ART 39.2 07/26/2014  0946   PO2ART 116.0* 07/26/2014 0946   HCO3 24.2* 07/26/2014 0946   TCO2 23 07/26/2014 1607   ACIDBASEDEF 1.0 07/26/2014 0946   O2SAT 99.0 07/26/2014 0946    CXR: PORTABLE CHEST - 1 VIEW  COMPARISON: Yesterday  FINDINGS: Interval tracheal and esophageal extubation. Swan-Ganz catheter from the left IJ sheath has been removed. Thoracic drains remain in similar position.  When accounting for differences in projection, stable cardiopericardial enlargement in this patient status post recent mitral valve replacement.  Bibasilar atelectasis and pleural effusions are stable to slightly increased. No pulmonary edema or pneumothorax.  IMPRESSION: 1. Stable positioning of remaining tubes and central line. 2. Similar bibasilar atelectasis with small effusions.   Electronically Signed  By: Marnee Spring M.D.  On: 07/27/2014 07:23   Assessment/Plan: S/P Procedure(s) (LRB): REDO MITRAL VALVE REPLACEMENT (MVR) (N/A)  Overall doing well POD2 Went into Afib this morning w/ elevated HR, stable BP Renal function stable - creatinine down from immediately pre-op and making good UOP Expected post op acute blood loss anemia, stable Expected post op atelectasis, mild-moderate   D/C mediastinal tubes and leave pleural tubes until output decreases  Start amiodarone and wean dopamine off  Supplement potassium carefully  Intermittent lasix to stimulate UOP  Watch anemia  Restart coumadin  Mobilize  Keep foley in to monitor UOP   Purcell Nails 07/27/2014 10:06 AM

## 2014-07-27 NOTE — Progress Notes (Signed)
Patient ID: [name], female   DOB: 1945-12-08, 69 y.o.   MRN: [id number]  SICU Evening Rounds:  Hemodynamically stable  Developed Afib with RVR this am and started on amio. Now sinus 70's.  Desaturation this afternoon. CXR showed bibasilar atelectasis with poor inspiration and large gastric air bubble unchanged from this am. Sats back up now.  Urine output ok. Received 80 mg lasix this afternoon but has not had a large diuresis from that.  Continue IS, order flutter valve. She is having LE dopplers now to rule out DVT.

## 2014-07-27 NOTE — Progress Notes (Signed)
Patient started to desat to the 70's, SOB, diminished and rhonchi on the right side. Called RT, put on non-rebreather and called MD. Stat chest xray ordered, CCM consulted. Will continue to monitor.  Domenica Fail, RN 07/27/14 1715

## 2014-07-27 NOTE — Progress Notes (Signed)
Subjective: Does not know what year it is or where she is, states she feels groggy. Denies any pain.   Objective: Vital signs in last 24 hours: Temp:  [97.3 F (36.3 C)-98.2 F (36.8 C)] 97.6 F (36.4 C) (07/20 0730) Pulse Rate:  [56-99] 99 (07/20 0600) Resp:  [0-32] 24 (07/20 0600) BP: (98-150)/(51-83) 147/79 mmHg (07/20 0800) SpO2:  [98 %-100 %] 100 % (07/20 0600) Arterial Line BP: (62-63)/(47-50) 62/47 mmHg (07/19 0915) FiO2 (%):  [40 %] 40 % (07/19 0857) Weight:  [179 lb 7.3 oz (81.4 kg)] 179 lb 7.3 oz (81.4 kg) (07/20 0600) Weight change: -18 lb 8.3 oz (-8.4 kg)  Intake/Output from previous day: 07/19 0701 - 07/20 0700 In: 1107.5 [I.V.:757.5; IV Piggyback:350] Out: 3450 [Urine:2770; Stool:100; Chest Tube:580] Intake/Output this shift: Total I/O In: 74.3 [I.V.:24.3; IV Piggyback:50] Out: 230 [Urine:130; Chest Tube:100]  Physical Exam  Constitutional: She appears well-developed and well-nourished.     Cardiovascular: Normal rate and regular rhythm.   Pulmonary/Chest: Effort normal and breath sounds normal.  Abdominal: Soft. She exhibits no distension.  Hypoactive bowel sounds   Musculoskeletal: She exhibits edema (sacral edema b/l, no pedal edema).  Diffuse anasarca   Skin: Skin is warm and dry.  Crisp valve sounds 3+ periph edema diffusely Lab Results:  Recent Labs  07/26/14 1605 07/26/14 1607 07/27/14 0415  WBC 14.4*  --  15.4*  HGB 9.4* 9.5* 9.4*  HCT 27.3* 28.0* 28.1*  PLT 102*  --  111*   BMET:  Recent Labs  07/26/14 0500  07/26/14 1607 07/27/14 0415  NA 144  --  149* 148*  K 3.0*  --  3.3* 3.5  CL 113*  --  109 113*  CO2 23  --   --  27  GLUCOSE 125*  --  121* 105*  BUN 65*  --  58* 60*  CREATININE 3.17*  < > 3.10* 3.03*  CALCIUM 8.4*  --   --  9.7  < > = values in this interval not displayed. No results for input(s): PTH in the last 72 hours. Iron Studies: No results for input(s): IRON, TIBC, TRANSFERRIN, FERRITIN in the last 72  hours. CBG (last 3)   Recent Labs  07/26/14 2334 07/27/14 0406 07/27/14 0728  GLUCAP 90 89 90     Studies/Results: Dg Chest Port 1 View  07/27/2014   CLINICAL DATA:  Chest tubes  EXAM: PORTABLE CHEST - 1 VIEW  COMPARISON:  Yesterday  FINDINGS: Interval tracheal and esophageal extubation. Swan-Ganz catheter from the left IJ sheath has been removed. Thoracic drains remain in similar position.  When accounting for differences in projection, stable cardiopericardial enlargement in this patient status post recent mitral valve replacement.  Bibasilar atelectasis and pleural effusions are stable to slightly increased. No pulmonary edema or pneumothorax.  IMPRESSION: 1. Stable positioning of remaining tubes and central line. 2. Similar bibasilar atelectasis with small effusions.   Electronically Signed   By: Marnee Spring M.D.   On: 07/27/2014 07:23   Dg Chest Port 1 View  07/26/2014   CLINICAL DATA:  Atelectasis.  Intubation.  EXAM: PORTABLE CHEST - 1 VIEW  COMPARISON:  07/25/2014.  FINDINGS: Endotracheal to, NG tube, bilateral chest tubes in stable position. Swan-Ganz catheter is in stable position looped in the main pulmonary artery. Prior cardiac valve replacement. Stable cardiomegaly. Low lung volumes with persistent bibasilar atelectasis. Tiny left pleural effusion. No pneumothorax.  IMPRESSION: 1. Lines and tubes in stable position including bilateral chest tubes. Swan-Ganz catheter  is in stable position with its tip looped in the main pulmonary artery. 2. Prior cardiac valve replacement. Stable cardiomegaly. No pulmonary venous congestion. 3. Low lung volumes with persistent bibasilar atelectasis. Tiny left pleural effusion.   Electronically Signed   By: Maisie Fus  Register   On: 07/26/2014 07:21   Dg Chest Port 1 View  07/25/2014   CLINICAL DATA:  Atelectasis.  Redo of mitral valve replacement.  EXAM: PORTABLE CHEST - 1 VIEW  COMPARISON:  07/24/2014 and 07/23/2014  FINDINGS: There is a new mitral  valve prosthesis. Endotracheal tube and NG tube and bilateral chest tubes are in place. Swan-Ganz catheter tip appears looped in the main pulmonary artery.  No pneumothorax. Bibasilar atelectasis and small left effusion. Pulmonary edema has resolved in the right perihilar region.  IMPRESSION: 1. Swan-Ganz catheter appears looped in the main pulmonary artery. 2. No pneumothorax. 3. At improved aeration on the right. 4. Persistent small effusion on the left.   Electronically Signed   By: Francene Boyers M.D.   On: 07/25/2014 15:06    Scheduled: . sodium chloride   Intravenous Once  . sodium chloride   Intravenous Once  . acetaminophen  1,000 mg Oral 4 times per day  . antiseptic oral rinse  7 mL Mouth Rinse QID  . aspirin EC  325 mg Oral Daily  . bisacodyl  10 mg Oral Daily   Or  . bisacodyl  10 mg Rectal Daily  . chlorhexidine  15 mL Mouth Rinse BID  . docusate sodium  200 mg Oral Daily  . famotidine (PEPCID) IV  20 mg Intravenous Q24H  . insulin aspart  0-24 Units Subcutaneous 6 times per day  . magnesium sulfate  4 g Intravenous Once  . metoprolol tartrate  12.5 mg Oral BID   Or  . metoprolol tartrate  12.5 mg Per Tube BID  . sodium chloride  3 mL Intravenous Q12H    Assessment/Plan: 1. AKI/CKD- oliguric. Unclear baseline Scr. Likely related to acute decompensated CHF in setting of cardiogenic shock and valvular dysfunction along with Ace-Inhibitor use 1. Renal US without obstruction 2. Cardiac cath 7/17 revealed severe mitral stenosis w/ pulmonary HTN, 10ml of contrast used, cont IVF,  3. Will follow potassium and acid base status.  4. Hypernatremia- likely 2/2 to aggressive diuresis w/ lasix leading to loss of free water, will need to transition NS to D5W  Litlle change today ? How much reversibility,cont slow diuresis. 2. Cardiogenic shock due to acute onset mitral stenosis due to prosthetic valve dysfunction 1. Day 2 s/p mitral valve replacement w/ Dr. Barry Dienes 2. Continue with  pressors for now and supportive care 3. H/o MVR with acute mitral stenosis on admission - resolved s/p mitral valve replacement 4. Metabolic acidosis due to #1 resolved with isotonic bicarb gtt.  5. SIRS- on pressors and cefuroxime 6. VDRF- per PCCM, extubated 7/19 7.   Abnormal LFT's- likely 2/2 hypotension from PEA arrest and cardiogenic shock. Trending down 8.   Leukocytosis- resolving 9. Volume overload, still net +7.45L up, will monitor lytes w/ diuresis as above.    LOS: 6 days   Gara Kroner I have seen and examined this patient and agree with the plan of care seen, examined, eval, discussed with staff, Dr. Cornelius Moras, and residents .  Yarah Fuente L 07/27/2014, 1:40 PM

## 2014-07-27 NOTE — Progress Notes (Signed)
eLink Physician-Brief Progress Note Patient Name: Darrelyn Hillock DOB: 1945/12/07 MRN: [id number]   Date of Service  07/27/2014  HPI/Events of Note  Spoke with Dr. Hortencia Pilar regarding my concerns for possible PE with persistent hypoxia & lack of chemical DVT prophylaxis given acute decompensation.  eICU Interventions  Dr. Kathrin Penner feels PE unlikely. Concerned for risk of bleed with recent surgery so doesn't feel heparin gtt is necessary. Dr. Hortencia Pilar wants to proceed with incentive spirometry & Lasix.     Intervention Category Minor Interventions: Communication with other healthcare providers and/or family  Lawanda Cousins 07/27/2014, 6:50 PM

## 2014-07-28 ENCOUNTER — Inpatient Hospital Stay (HOSPITAL_COMMUNITY): Payer: Medicare Other

## 2014-07-28 LAB — BASIC METABOLIC PANEL
ANION GAP: 10 (ref 5–15)
BUN: 50 mg/dL — AB (ref 6–20)
CO2: 27 mmol/L (ref 22–32)
CREATININE: 2.53 mg/dL — AB (ref 0.44–1.00)
Calcium: 10.3 mg/dL (ref 8.9–10.3)
Chloride: 114 mmol/L — ABNORMAL HIGH (ref 101–111)
GFR calc Af Amer: 22 mL/min — ABNORMAL LOW (ref >60)
GFR calc non Af Amer: 19 mL/min — ABNORMAL LOW (ref >60)
Glucose, Bld: 121 mg/dL — ABNORMAL HIGH (ref 65–99)
Potassium: 3.3 mmol/L — ABNORMAL LOW (ref 3.5–5.1)
Sodium: 151 mmol/L — ABNORMAL HIGH (ref 135–145)

## 2014-07-28 LAB — CBC
HEMATOCRIT: 30.8 % — AB (ref 36.0–46.0)
HEMOGLOBIN: 10.3 g/dL — AB (ref 12.0–15.0)
MCH: 29.5 pg (ref 26.0–34.0)
MCHC: 33.4 g/dL (ref 30.0–36.0)
MCV: 88.3 fL (ref 78.0–100.0)
PLATELETS: 100 10*3/uL — AB (ref 150–400)
RBC: 3.49 MIL/uL — AB (ref 3.87–5.11)
RDW: 14.1 % (ref 11.5–15.5)
WBC: 16.1 10*3/uL — ABNORMAL HIGH (ref 4.0–10.5)

## 2014-07-28 LAB — GLUCOSE, CAPILLARY
GLUCOSE-CAPILLARY: 99 mg/dL (ref 65–99)
GLUCOSE-CAPILLARY: 127 mg/dL — AB (ref 65–99)
Glucose-Capillary: 118 mg/dL — ABNORMAL HIGH (ref 65–99)
Glucose-Capillary: 138 mg/dL — ABNORMAL HIGH (ref 65–99)

## 2014-07-28 LAB — PROTIME-INR
INR: 2.12 — AB (ref 0.00–1.49)
PROTHROMBIN TIME: 23.6 s — AB (ref 11.6–15.2)

## 2014-07-28 MED ORDER — POTASSIUM CHLORIDE 10 MEQ/50ML IV SOLN
10.0000 meq | INTRAVENOUS | Status: AC
  Administered 2014-07-28 (×3): 10 meq via INTRAVENOUS

## 2014-07-28 MED ORDER — ASPIRIN EC 81 MG PO TBEC
81.0000 mg | DELAYED_RELEASE_TABLET | Freq: Every day | ORAL | Status: DC
  Administered 2014-07-28 – 2014-08-03 (×7): 81 mg via ORAL
  Filled 2014-07-28 (×7): qty 1

## 2014-07-28 MED ORDER — AMIODARONE HCL 200 MG PO TABS
200.0000 mg | ORAL_TABLET | Freq: Two times a day (BID) | ORAL | Status: DC
  Administered 2014-07-28 – 2014-08-03 (×14): 200 mg via ORAL
  Filled 2014-07-28 (×15): qty 1

## 2014-07-28 NOTE — Progress Notes (Signed)
Subjective: Did not know what year it was, states it was 69. Denies any chest pain or SOB>   Objective: Vital signs in last 24 hours: Temp:  [97.5 F (36.4 C)-98.3 F (36.8 C)] 98.2 F (36.8 C) (07/21 0408) Pulse Rate:  [25-81] 70 (07/21 0600) Resp:  [9-27] 27 (07/21 0600) BP: (114-164)/(62-93) 146/74 mmHg (07/21 0600) SpO2:  [85 %-100 %] 95 % (07/21 0600) Weight:  [175 lb 11.3 oz (79.7 kg)] 175 lb 11.3 oz (79.7 kg) (07/21 0500) Weight change: -3 lb 12 oz (-1.7 kg)  Intake/Output from previous day: 07/20 0701 - 07/21 0700 In: 2442.4 [P.O.:1180; I.V.:1002.4; IV Piggyback:200] Out: 4200 [Urine:3180; Stool:100; Chest Tube:920] Intake/Output this shift:    Physical Exam  Constitutional: She appears well-developed and well-nourished.     Cardiovascular: Normal rate and regular rhythm.   Pulmonary/Chest: Effort normal and breath sounds normal.  Abdominal: Soft. Bowel sounds are normal. She exhibits no distension.     Musculoskeletal: She exhibits edema (sacral edema b/l, trace pedal edema).     Skin: Skin is warm and dry.   Lab Results:  Recent Labs  07/27/14 0415 07/28/14 0239  WBC 15.4* 16.1*  HGB 9.4* 10.3*  HCT 28.1* 30.8*  PLT 111* 100*   BMET:   Recent Labs  07/27/14 0415 07/28/14 0239  NA 148* 151*  K 3.5 3.3*  CL 113* 114*  CO2 27 27  GLUCOSE 105* 121*  BUN 60* 50*  CREATININE 3.03* 2.53*  CALCIUM 9.7 10.3   No results for input(s): PTH in the last 72 hours. Iron Studies: No results for input(s): IRON, TIBC, TRANSFERRIN, FERRITIN in the last 72 hours. CBG (last 3)   Recent Labs  07/27/14 1914 07/27/14 2344 07/28/14 0354  GLUCAP 175* 106* 99     Studies/Results: Dg Chest Port 1 View  07/28/2014   CLINICAL DATA:  Respiratory failure. Recent mitral valve replacement.  EXAM: PORTABLE CHEST - 1 VIEW  COMPARISON:  07/27/2014 and 07/26/2014.  FINDINGS: 0550 hr. Left IJ sheath and bilateral chest tubes remain in place. There has been slight  improvement in the bibasilar aeration. There are persistent bibasilar airspace opacities and probable small bilateral pleural effusions. No evidence of pneumothorax. The heart size and mediastinal contours are stable status post median sternotomy and mitral valve replacement.  IMPRESSION: Slight improvement in the bibasilar aeration.  No pneumothorax.   Electronically Signed   By: Carey Bullocks M.D.   On: 07/28/2014 07:24   Dg Chest Port 1 View  07/27/2014   CLINICAL DATA:  69 year old female with shortness of breath  EXAM: PORTABLE CHEST - 1 VIEW  COMPARISON:  Earlier radiograph dated 07/27/2014  FINDINGS: There has been interval removal of the mediastinal drain. Two thoracic chest tubes are in stable position. There new postsurgical changes of mitral valve replacement. There has been interval decrease in the size of the mediastinal opacity compared to the prior study. Bibasilar atelectatic changes again noted. Median sternotomy wires.  IMPRESSION: Interval removal of mediastinal drain with interval decrease in the mediastinal opacity.   Electronically Signed   By: Elgie Collard M.D.   On: 07/27/2014 17:42   Dg Chest Port 1 View  07/27/2014   CLINICAL DATA:  Chest tubes  EXAM: PORTABLE CHEST - 1 VIEW  COMPARISON:  Yesterday  FINDINGS: Interval tracheal and esophageal extubation. Swan-Ganz catheter from the left IJ sheath has been removed. Thoracic drains remain in similar position.  When accounting for differences in projection, stable cardiopericardial enlargement in this  patient status post recent mitral valve replacement.  Bibasilar atelectasis and pleural effusions are stable to slightly increased. No pulmonary edema or pneumothorax.  IMPRESSION: 1. Stable positioning of remaining tubes and central line. 2. Similar bibasilar atelectasis with small effusions.   Electronically Signed   By: Marnee Spring M.D.   On: 07/27/2014 07:23    Scheduled: . sodium chloride   Intravenous Once  . sodium  chloride   Intravenous Once  . acetaminophen  1,000 mg Oral 4 times per day  . amiodarone  200 mg Oral BID PC  . antiseptic oral rinse  7 mL Mouth Rinse BID  . aspirin EC  81 mg Oral Daily  . bisacodyl  10 mg Oral Daily   Or  . bisacodyl  10 mg Rectal Daily  . docusate sodium  200 mg Oral Daily  . pantoprazole  40 mg Oral Daily  . potassium chloride  10 mEq Intravenous Q1 Hr x 3  . sodium chloride  3 mL Intravenous Q12H  . warfarin  2.5 mg Oral q1800  . Warfarin - Physician Dosing Inpatient   Does not apply q1800    Assessment/Plan: 1. AKI/CKD- . Unclear baseline Scr. Likely related to acute decompensated CHF in setting of cardiogenic shock and valvular dysfunction along with Ace-Inhibitor use. Recent constrast exposure 7/17 w/ cath. Good UOP, Cr improving.  1. Renal US without obstruction 2. Will follow potassium and acid base status. Appears improving GFR.  Vol xs but hypervolemic, hypernatremia. Give free water.  Will need more diuresis soon. 2. Hypernatremia w/ hypokalemia- likely 2/2 to aggressive diuresis w/ lasix. Lasix held per CT sx. K repleted. D5W at 75cc/hCardiogenic shock due to acute onset mitral stenosis due to prosthetic valve dysfunction 1. Day 3 s/p mitral valve replacement w/ Dr. Barry Dienes 3. H/o MVR with acute mitral stenosis on admission - resolved s/p mitral valve replacement 4. Metabolic acidosis due to #1 resolved with isotonic bicarb gtt.  5. SIRS- off pressors and abx, wound and blood cultures NGTD 6. VDRF- per PCCM, extubated 7/19 7.   Abnormal LFT's- likely 2/2 hypotension from PEA arrest and cardiogenic shock. Trending down 8.   Leukocytosis- trending up, likely post op related 9. Volume overload, still net +5.3L up, held lasix as above   LOS: 7 days   Gara Kroner I have seen and examined this patient and agree with the plan of care seen, eval, examined and discussed with residents and staff. .  Shalee Paolo L 07/28/2014, 2:48 PM

## 2014-07-28 NOTE — Progress Notes (Addendum)
[address]       [place] [address]             [phone]        CARDIOTHORACIC SURGERY PROGRESS NOTE   R3 Days Post-Op Procedure(s) (LRB): REDO MITRAL VALVE REPLACEMENT (MVR) (N/A)  Subjective: Looks good.  Still mildly confused but otherwise doing well.  Denies SOB or pain.  Objective: Vital signs: BP Readings from Last 1 Encounters:  07/28/14 146/74   Pulse Readings from Last 1 Encounters:  07/28/14 70   Resp Readings from Last 1 Encounters:  07/28/14 27   Temp Readings from Last 1 Encounters:  07/28/14 98.2 F (36.8 C) Oral    Hemodynamics:    Physical Exam:  Rhythm:   Sinus brady - AAI pacing  Breath sounds: clear  Heart sounds:  RRR  Incisions:  Dressing dry, intact  Abdomen:  Soft, non-distended, non-tender  Extremities:  Warm, well perfused  Chest tubes:  Low volume thin serosanguinous output, no air leak    Intake/Output from previous day: 07/20 0701 - 07/21 0700 In: 2442.4 [P.O.:1180; I.V.:1002.4; IV Piggyback:200] Out: 4200 [Urine:3180; Stool:100; Chest Tube:920] Intake/Output this shift:    Lab Results:  CBC: Recent Labs  07/27/14 0415 07/28/14 0239  WBC 15.4* 16.1*  HGB 9.4* 10.3*  HCT 28.1* 30.8*  PLT 111* 100*    BMET:  Recent Labs  07/27/14 0415 07/28/14 0239  NA 148* 151*  K 3.5 3.3*  CL 113* 114*  CO2 27 27  GLUCOSE 105* 121*  BUN 60* 50*  CREATININE 3.03* 2.53*  CALCIUM 9.7 10.3     PT/INR:   Recent Labs  07/28/14 0239  LABPROT 23.6*  INR 2.12*    CBG (last 3)   Recent Labs  07/27/14 1914 07/27/14 2344 07/28/14 0354  GLUCAP 175* 106* 99    ABG    Component Value Date/Time   PHART 7.396 07/26/2014 0946   PCO2ART 39.2 07/26/2014 0946   PO2ART 116.0* 07/26/2014 0946   HCO3 24.2* 07/26/2014 0946   TCO2 23 07/26/2014 1607   ACIDBASEDEF 1.0 07/26/2014 0946   O2SAT 99.0 07/26/2014 0946    CXR: PORTABLE CHEST - 1 VIEW  COMPARISON: 07/27/2014 and  07/26/2014.  FINDINGS: 0550 hr. Left IJ sheath and bilateral chest tubes remain in place. There has been slight improvement in the bibasilar aeration. There are persistent bibasilar airspace opacities and probable small bilateral pleural effusions. No evidence of pneumothorax. The heart size and mediastinal contours are stable status post median sternotomy and mitral valve replacement.  IMPRESSION: Slight improvement in the bibasilar aeration. No pneumothorax.   Electronically Signed  By: Carey Bullocks M.D.  On: 07/28/2014 07:24  Assessment/Plan: S/P Procedure(s) (LRB): REDO MITRAL VALVE REPLACEMENT (MVR) (N/A)  Overall stable/improved now POD3 emergency redo MVR for thrombosis of mechanical mitral valve prosthesis Post-op Afib - converted back to sinus brady overnight on IV amiodarone Acute hypoxemia yesterday evening due to mucous plugging and atelectasis - responded well to pulm toilet and suctioning, now stable on room air Acute on chronic renal insufficiency improving, creatinine down to 2.5 this morning and diuresing very well Hypernatremia somewhat worse w/ sodium up to 151 - may be contributing to mild confusion Expected post op volume excess, diuresed well yesterday - doesn't look terribly volume overloaded and baseline weight unclear Expected post op atelectasis, moderate - slightly improved Expected post op acute blood loss anemia, mild, stable Long-standing hypertension - BP very stable and well controlled  at present Generalized weakness and deconditioning due to acute presentation and critical illness pre-op   Advance diet and convert amiodarone to oral if she tolerates food  Continue AAI pacing for now  Mobilize  Pulm toilet  Hold lasix today and encourage po's - watch serum sodium  D/C foley and Flexiseal  Leave chest tubes until output decreased  Continue coumadin 2.5 mg/day for now - INR 2.1 today  Keep in ICU today  Purcell Nails 07/28/2014 7:29 AM

## 2014-07-28 NOTE — Progress Notes (Signed)
Patient ID: [name], female   DOB: 1945-08-09, 69 y.o.   MRN: [id number] EVENING ROUNDS NOTE :     [address]       [place] [address]             [phone]                 3 Days Post-Op Procedure(s) (LRB): REDO MITRAL VALVE REPLACEMENT (MVR) (N/A)  Total Length of Stay:  LOS: 7 days  BP 137/68 mmHg  Pulse 69  Temp(Src) 98.5 F (36.9 C) (Oral)  Resp 10  Ht 5\' 6"  (1.676 m)  Wt 175 lb 11.3 oz (79.7 kg)  BMI 28.37 kg/m2  SpO2 96%  .Intake/Output      07/20 0701 - 07/21 0700 07/21 0701 - 07/22 0700   P.O. 1180    I.V. (mL/kg) 1029.1 (12.9) 331.8 (4.2)   Other 60    IV Piggyback 200 150   Total Intake(mL/kg) 2469.1 (31) 481.8 (6)   Urine (mL/kg/hr) 3455 (1.8) 1700 (1.9)   Stool 100 (0.1) 0 (0)   Chest Tube 930 (0.5) 130 (0.1)   Total Output 4485 1830   Net -2016 -1348.2          . sodium chloride Stopped (07/27/14 1700)  . dextrose 75 mL/hr at 07/28/14 1445     Lab Results  Component Value Date   WBC 16.1* 07/28/2014   HGB 10.3* 07/28/2014   HCT 30.8* 07/28/2014   PLT 100* 07/28/2014   GLUCOSE 121* 07/28/2014   CHOL 169 02/17/2013   TRIG 99 02/17/2013   HDL 50 02/17/2013   LDLCALC 99 02/17/2013   ALT 77* 07/27/2014   AST 45* 07/27/2014   NA 151* 07/28/2014   K 3.3* 07/28/2014   CL 114* 07/28/2014   CREATININE 2.53* 07/28/2014   BUN 50* 07/28/2014   CO2 27 07/28/2014   TSH 1.770 *Test methodology is 3rd generation TSH* 01/17/2007   INR 2.12* 07/28/2014   HGBA1C  01/13/2007    5.8 (NOTE)   The ADA recommends the following therapeutic goals for glycemic   control related to Hgb A1C measurement:   Goal of Therapy:   < 7.0% Hgb A1C   Action Suggested:  > 8.0% Hgb A1C   Ref:  Diabetes Care, 22, Suppl. 1, 1999   Alert and talkative, eating meal Cr 2.53  Delight Ovens MD  Beeper [phone] Office [phone] 07/28/2014 6:12 PM

## 2014-07-29 ENCOUNTER — Inpatient Hospital Stay (HOSPITAL_COMMUNITY): Payer: Medicare Other

## 2014-07-29 LAB — BASIC METABOLIC PANEL
ANION GAP: 9 (ref 5–15)
BUN: 38 mg/dL — AB (ref 6–20)
CHLORIDE: 104 mmol/L (ref 101–111)
CO2: 30 mmol/L (ref 22–32)
Calcium: 9.5 mg/dL (ref 8.9–10.3)
Creatinine, Ser: 2.17 mg/dL — ABNORMAL HIGH (ref 0.44–1.00)
GFR calc non Af Amer: 22 mL/min — ABNORMAL LOW (ref >60)
GFR, EST AFRICAN AMERICAN: 26 mL/min — AB (ref >60)
Glucose, Bld: 109 mg/dL — ABNORMAL HIGH (ref 65–99)
Potassium: 3 mmol/L — ABNORMAL LOW (ref 3.5–5.1)
Sodium: 143 mmol/L (ref 135–145)

## 2014-07-29 LAB — GLUCOSE, CAPILLARY: GLUCOSE-CAPILLARY: 84 mg/dL (ref 65–99)

## 2014-07-29 LAB — PROTIME-INR
INR: 2.82 — ABNORMAL HIGH (ref 0.00–1.49)
Prothrombin Time: 29.2 seconds — ABNORMAL HIGH (ref 11.6–15.2)

## 2014-07-29 MED ORDER — POTASSIUM CHLORIDE CRYS ER 20 MEQ PO TBCR: 40.0000 meq | EXTENDED_RELEASE_TABLET | Freq: Once | ORAL | Status: DC

## 2014-07-29 MED ORDER — MOVING RIGHT ALONG BOOK
Freq: Once | Status: AC
  Administered 2014-07-29: 08:00:00
  Filled 2014-07-29: qty 1

## 2014-07-29 MED ORDER — POTASSIUM CHLORIDE CRYS ER 20 MEQ PO TBCR
40.0000 meq | EXTENDED_RELEASE_TABLET | Freq: Once | ORAL | Status: AC
  Administered 2014-07-29: 40 meq via ORAL
  Filled 2014-07-29: qty 2

## 2014-07-29 MED ORDER — ATORVASTATIN CALCIUM 20 MG PO TABS
20.0000 mg | ORAL_TABLET | Freq: Every day | ORAL | Status: DC
  Administered 2014-07-30 – 2014-08-03 (×5): 20 mg via ORAL
  Filled 2014-07-29 (×5): qty 1

## 2014-07-29 MED ORDER — POTASSIUM CHLORIDE CRYS ER 20 MEQ PO TBCR
20.0000 meq | EXTENDED_RELEASE_TABLET | Freq: Every day | ORAL | Status: DC
  Filled 2014-07-29: qty 1

## 2014-07-29 MED ORDER — WARFARIN SODIUM 1 MG PO TABS
1.0000 mg | ORAL_TABLET | Freq: Every day | ORAL | Status: DC
  Filled 2014-07-29: qty 1

## 2014-07-29 MED ORDER — FUROSEMIDE 40 MG PO TABS
40.0000 mg | ORAL_TABLET | Freq: Every day | ORAL | Status: DC
  Administered 2014-07-29 – 2014-07-30 (×2): 40 mg via ORAL
  Filled 2014-07-29 (×2): qty 1

## 2014-07-29 MED ORDER — SODIUM CHLORIDE 0.9 % IJ SOLN: 3.0000 mL | INTRAMUSCULAR | Status: DC | PRN

## 2014-07-29 MED ORDER — SODIUM CHLORIDE 0.9 % IV SOLN: 250.0000 mL | INTRAVENOUS | Status: DC | PRN

## 2014-07-29 MED ORDER — SIMVASTATIN 40 MG PO TABS: 40.0000 mg | ORAL_TABLET | Freq: Every day | ORAL | Status: DC

## 2014-07-29 MED ORDER — SODIUM CHLORIDE 0.9 % IJ SOLN
3.0000 mL | Freq: Two times a day (BID) | INTRAMUSCULAR | Status: DC
  Administered 2014-07-29: 10 mL via INTRAVENOUS
  Administered 2014-07-29 – 2014-08-03 (×7): 3 mL via INTRAVENOUS

## 2014-07-29 NOTE — Progress Notes (Addendum)
[address]       [place] [address]             [phone]        CARDIOTHORACIC SURGERY PROGRESS NOTE   R4 Days Post-Op Procedure(s) (LRB): REDO MITRAL VALVE REPLACEMENT (MVR) (N/A)  Subjective: Looks and feels well.  Slept well last night.  Denies pain, SOB, cough.  Confusion seems to have almost completely resolved  Objective: Vital signs: BP Readings from Last 1 Encounters:  07/29/14 108/66   Pulse Readings from Last 1 Encounters:  07/29/14 74   Resp Readings from Last 1 Encounters:  07/29/14 18   Temp Readings from Last 1 Encounters:  07/29/14 97.7 F (36.5 C) Oral    Hemodynamics:    Physical Exam:  Rhythm:   Sinus brady/junctional - AAI pacing  Breath sounds: clear  Heart sounds:  RRR  Incisions:  Clean and dry  Abdomen:  Soft, non-distended, non-tender  Extremities:  Warm, well-perfused  Chest tubes:  Low volume thin serosanguinous output, no air leak    Intake/Output from previous day: 07/21 0701 - 07/22 0700 In: 2696.8 [P.O.:1080; I.V.:1466.8; IV Piggyback:150] Out: 2641 [Urine:2250; Stool:1; Chest Tube:390] Intake/Output this shift:    Lab Results:  CBC: Recent Labs  07/27/14 0415 07/28/14 0239  WBC 15.4* 16.1*  HGB 9.4* 10.3*  HCT 28.1* 30.8*  PLT 111* 100*    BMET:  Recent Labs  07/28/14 0239 07/29/14 0300  NA 151* 143  K 3.3* 3.0*  CL 114* 104  CO2 27 30  GLUCOSE 121* 109*  BUN 50* 38*  CREATININE 2.53* 2.17*  CALCIUM 10.3 9.5     PT/INR:   Recent Labs  07/29/14 0300  LABPROT 29.2*  INR 2.82*    CBG (last 3)   Recent Labs  07/28/14 0846 07/28/14 1212 07/28/14 1618  GLUCAP 118* 127* 138*    ABG    Component Value Date/Time   PHART 7.396 07/26/2014 0946   PCO2ART 39.2 07/26/2014 0946   PO2ART 116.0* 07/26/2014 0946   HCO3 24.2* 07/26/2014 0946   TCO2 23 07/26/2014 1607   ACIDBASEDEF 1.0 07/26/2014 0946   O2SAT 99.0 07/26/2014 0946    CXR: PORTABLE CHEST - 1  VIEW  COMPARISON: 07/28/2014.  FINDINGS: Interim removal of left IJ sheath. Bilateral chest tubes in stable position. No pneumothorax. Prior mitral valve replacement. Heart size stable. Persistent but improving bibasilar subsegmental atelectasis. No pleural effusion. No acute bony abnormality.  IMPRESSION: 1. Interim removal of left IJ sheath. Bilateral chest tubes in stable position. No pneumothorax. 2. Prior mitral valve replacement. Heart size stable. No pulmonary venous congestion. 3. Persistent but improving bibasilar atelectasis.   Electronically Signed  By: Maisie Fus Register  On: 07/29/2014 07:40  Assessment/Plan: S/P Procedure(s) (LRB): REDO MITRAL VALVE REPLACEMENT (MVR) (N/A)  Overall stable/improved now POD4 emergency redo MVR for thrombosis of mechanical mitral valve prosthesis Post-op Afib - maintaining NSR on amiodarone Breathing comfortably w/ stable O2 sats on room air Acute on chronic renal insufficiency improving, creatinine down further to 2.2 this morning and diuresing very well Hypernatremia resolved Expected post op volume excess - doesn't look terribly volume overloaded and baseline weight unclear Expected post op atelectasis, improved Expected post op acute blood loss anemia, mild, stable Long-standing hypertension - BP very stable and well controlled at present Generalized weakness and deconditioning due to acute presentation and critical illness pre-op Anticoagulated on warfarin, INR up to 2.8 today   Continue AAI pacing for  now  Continue low dose amiodarone  Mobilize  Pulm toilet  Restart low dose lasix  Stop D5W  D/C chest tubes   No coumadin today  Transfer step down  Purcell Nails 07/29/2014 8:03 AM

## 2014-07-29 NOTE — Progress Notes (Addendum)
Nutrition Follow-up  DOCUMENTATION CODES:   Not applicable  INTERVENTION:   No nutrition intervention at this time --- patient declined  NEW NUTRITION DIAGNOSIS:   Increased nutrient needs related to  (post op state) as evidenced by estimated needs, ongoing  GOAL:   Patient will meet greater than or equal to 90% of their needs, progressing  MONITOR:   PO intake, Labs, Weight trends, I & O's  ASSESSMENT:   69 year old female presented to ED 7/14 with AMS. In ED was profoundly hypotensive with elevated lactic, and unfortunately suffered 2 PEA arrests totaling approximately 5 minutes in duration.  Patient s/p procedure 7/18: REDO MITRAL VALVE REPLACEMENT  Patient extubated 7/19.  Patient reports her appetite is doing OK.  PO intake variable at 0-50% per flowsheet records.  Declined addition of oral nutrition supplements at this time.  Nutrition-Focused physical exam completed. No muscle or subcutaneous fat depletion noticed.   Diet Order:  Diet Heart Room service appropriate?: Yes; Fluid consistency:: Thin  Skin:  Reviewed, no issues  Last BM:  7/22  Height:   Ht Readings from Last 1 Encounters:  07/21/14 5\' 6"  (1.676 m)    Weight:   Wt Readings from Last 1 Encounters:  07/29/14 177 lb 0.5 oz (80.3 kg)    Ideal Body Weight:  59.1 kg  Wt Readings from Last 10 Encounters:  07/29/14 177 lb 0.5 oz (80.3 kg)  03/11/14 151 lb 8 oz (68.72 kg)  01/21/13 164 lb 1.6 oz (74.435 kg)    BMI:  Body mass index is 28.59 kg/(m^2).  Estimated Nutritional Needs:   Kcal:  1900-2000  Protein:  90-100 gm  Fluid:  1.9-2.1 L  EDUCATION NEEDS:   No education needs identified at this time  Maureen Chatters, RD, LDN Pager #: [phone] After-Hours Pager #: [phone]

## 2014-07-29 NOTE — Evaluation (Signed)
Physical Therapy Evaluation Patient Details NameDarrelyn Rodriguez MRN: [id number] DOB: 03-15-1945 Today's Date: 07/29/2014   History of Present Illness  Adm 02-Aug-2014 AMS, coded x2 with PEA; intubated with ?sepsis; found to have occluding thrombus at mitral valve and underwent emergent redo MVR 7/18; extubated 7/19. +AMS PMH MVR on coumadin, HTN  Clinical Impression  Patient is s/p above surgery resulting in functional limitations due to the deficits listed below (see PT Problem List). AMS, dizziness (although not orthostatic), and decr balance limited mobility. Patient will benefit from skilled PT to increase their independence and safety with mobility to allow discharge to the venue listed below.       Follow Up Recommendations Home health PT;Supervision/Assistance - 24 hour (if mentation improves and steady progress)    Equipment Recommendations  Rolling walker with 5" wheels;3in1 (PT)    Recommendations for Other Services OT consult     Precautions / Restrictions Precautions Precautions: Sternal;Fall Precaution Comments: Patient unable to recall precautions. The patient needs reinforcement to maintain precautions with functional mobility.   Restrictions Weight Bearing Restrictions: No      Mobility  Bed Mobility Overal bed mobility: Needs Assistance;+2 for physical assistance;+ 2 for safety/equipment Bed Mobility: Rolling;Sit to Sidelying Rolling: Min assist       Sit to sidelying: Mod assist;+2 for safety/equipment General bed mobility comments: back to bed  Transfers Overall transfer level: Needs assistance Equipment used: Pushed w/c Transfers: Sit to/from Stand Sit to Stand: Min assist;+2 safety/equipment         General transfer comment: x2 from recliner; Patient continues to require education regarding safe and proper use of DME.    Ambulation/Gait Ambulation/Gait assistance: Mod assist;+2 safety/equipment Ambulation Distance (Feet): 8 Feet Assistive  device:  (w/c) Gait Pattern/deviations: Step-through pattern;Decreased stride length;Shuffle;Staggering left;Staggering right;Wide base of support     General Gait Details: pt became more unsteady as progressed; reported feeling dizzy and chair brought to pt with pt beginning to sit down prior to chair in position (impulsively)  Stairs            Wheelchair Mobility    Modified Rankin (Stroke Patients Only)       Balance Overall balance assessment: Needs assistance Sitting-balance support: No upper extremity supported;Feet supported Sitting balance-Leahy Scale: Fair     Standing balance support: Bilateral upper extremity supported Standing balance-Leahy Scale: Poor                               Pertinent Vitals/Pain Pain Assessment: Faces Faces Pain Scale: Hurts little more Pain Location: chest Pain Intervention(s): Limited activity within patient's tolerance;Monitored during session;Repositioned    Home Living Family/patient expects to be discharged to:: Private residence Living Arrangements: Alone Available Help at Discharge: Family;Available 24 hours/day Type of Home: Other(Comment) (townhouse) Home Access: Level entry     Home Layout: One level Home Equipment: None      Prior Function Level of Independence: Independent         Comments: retired from [employer]   Dominant Hand: Right    Extremity/Trunk Assessment   Upper Extremity Assessment: Overall WFL for tasks assessed           Lower Extremity Assessment: Generalized weakness      Cervical / Trunk Assessment: Normal  Communication   Communication: No difficulties  Cognition Arousal/Alertness: Awake/alert Behavior During Therapy: Impulsive (short-tempered) Overall Cognitive Status: Impaired/Different from baseline Area of Impairment:  Orientation;Attention;Memory;Following commands;Safety/judgement;Awareness;Problem solving Orientation Level: Time Current  Attention Level: Sustained Memory: Decreased recall of precautions Following Commands: Follows one step commands with increased time Safety/Judgement: Decreased awareness of safety;Decreased awareness of deficits Awareness: Intellectual Problem Solving: Slow processing;Difficulty sequencing;Requires verbal cues;Requires tactile cues General Comments: appeared to realize she has AMS, became defensive when asked questions or provided encouragement re: AMS    General Comments      Exercises General Exercises - Lower Extremity Ankle Circles/Pumps: AROM;10 reps      Assessment/Plan    PT Assessment Patient needs continued PT services  PT Diagnosis Difficulty walking;Acute pain;Altered mental status   PT Problem List Decreased strength;Decreased activity tolerance;Decreased balance;Decreased mobility;Decreased cognition;Decreased knowledge of use of DME;Decreased safety awareness;Decreased knowledge of precautions;Pain  PT Treatment Interventions DME instruction;Gait training;Functional mobility training;Therapeutic activities;Therapeutic exercise;Balance training;Cognitive remediation;Patient/family education   PT Goals (Current goals can be found in the Care Plan section) Acute Rehab PT Goals Patient Stated Goal: go home with children to stay with her PT Goal Formulation: With patient Time For Goal Achievement: 08/05/14 Potential to Achieve Goals: Good    Frequency Min 3X/week   Barriers to discharge        Co-evaluation               End of Session Equipment Utilized During Treatment: Gait belt;Oxygen Activity Tolerance: Treatment limited secondary to medical complications (Comment) (dizziness; was not orthostatic) Patient left: in bed;with call bell/phone within reach;with nursing/sitter in room;with family/visitor present Nurse Communication: Mobility status (dizziness and unsteady)         Time: 0100-7121 PT Time Calculation (min) (ACUTE ONLY): 31  min   Charges:   PT Evaluation $Initial PT Evaluation Tier I: 1 Procedure PT Treatments $Gait Training: 8-22 mins   PT G Codes:        [name] 2014-08-13, 11:15 AM Pager [phone]

## 2014-07-29 NOTE — Progress Notes (Signed)
Subjective: Denies SOB and chest pain. States year is 67.   Objective: Vital signs in last 24 hours: Temp:  [97.7 F (36.5 C)-98.8 F (37.1 C)] 97.7 F (36.5 C) (07/22 0354) Pulse Rate:  [28-97] 74 (07/22 0700) Resp:  [5-26] 18 (07/22 0700) BP: (108-146)/(54-81) 108/66 mmHg (07/22 0700) SpO2:  [94 %-100 %] 100 % (07/22 0700) Weight:  [177 lb 0.5 oz (80.3 kg)] 177 lb 0.5 oz (80.3 kg) (07/22 0500) Weight change: 1 lb 5.2 oz (0.6 kg)  Intake/Output from previous day: 07/21 0701 - 07/22 0700 In: 2696.8 [P.O.:1080; I.V.:1466.8; IV Piggyback:150] Out: 2641 [Urine:2250; Stool:1; Chest Tube:390] Intake/Output this shift:    Physical Exam  Constitutional: She appears well-developed and well-nourished.     Cardiovascular: Normal rate, regular rhythm and intact distal pulses.   Pulmonary/Chest: Effort normal.  Decreased breath sounds on left, rt sided crackles   Abdominal: Soft. Bowel sounds are normal. She exhibits no distension.     Musculoskeletal: She exhibits edema (sacral edema b/l, trace pedal edema).     Skin: Skin is warm and dry.   Lab Results:  Recent Labs  07/27/14 0415 07/28/14 0239  WBC 15.4* 16.1*  HGB 9.4* 10.3*  HCT 28.1* 30.8*  PLT 111* 100*   BMET:   Recent Labs  07/28/14 0239 07/29/14 0300  NA 151* 143  K 3.3* 3.0*  CL 114* 104  CO2 27 30  GLUCOSE 121* 109*  BUN 50* 38*  CREATININE 2.53* 2.17*  CALCIUM 10.3 9.5   No results for input(s): PTH in the last 72 hours. Iron Studies: No results for input(s): IRON, TIBC, TRANSFERRIN, FERRITIN in the last 72 hours. CBG (last 3)   Recent Labs  07/28/14 0846 07/28/14 1212 07/28/14 1618  GLUCAP 118* 127* 138*     Studies/Results: Dg Chest Port 1 View  07/29/2014   CLINICAL DATA:  Mitral valve replacement.  EXAM: PORTABLE CHEST - 1 VIEW  COMPARISON:  07/28/2014.  FINDINGS: Interim removal of left IJ sheath. Bilateral chest tubes in stable position. No pneumothorax. Prior mitral valve  replacement. Heart size stable. Persistent but improving bibasilar subsegmental atelectasis. No pleural effusion. No acute bony abnormality.  IMPRESSION: 1. Interim removal of left IJ sheath. Bilateral chest tubes in stable position. No pneumothorax. 2. Prior mitral valve replacement. Heart size stable. No pulmonary venous congestion. 3. Persistent but improving bibasilar atelectasis.   Electronically Signed   By: Maisie Fus  Register   On: 07/29/2014 07:40   Dg Chest Port 1 View  07/28/2014   CLINICAL DATA:  Respiratory failure. Recent mitral valve replacement.  EXAM: PORTABLE CHEST - 1 VIEW  COMPARISON:  07/27/2014 and 07/26/2014.  FINDINGS: 0550 hr. Left IJ sheath and bilateral chest tubes remain in place. There has been slight improvement in the bibasilar aeration. There are persistent bibasilar airspace opacities and probable small bilateral pleural effusions. No evidence of pneumothorax. The heart size and mediastinal contours are stable status post median sternotomy and mitral valve replacement.  IMPRESSION: Slight improvement in the bibasilar aeration.  No pneumothorax.   Electronically Signed   By: Carey Bullocks M.D.   On: 07/28/2014 07:24   Dg Chest Port 1 View  07/27/2014   CLINICAL DATA:  69 year old female with shortness of breath  EXAM: PORTABLE CHEST - 1 VIEW  COMPARISON:  Earlier radiograph dated 07/27/2014  FINDINGS: There has been interval removal of the mediastinal drain. Two thoracic chest tubes are in stable position. There new postsurgical changes of mitral valve replacement. There has  been interval decrease in the size of the mediastinal opacity compared to the prior study. Bibasilar atelectatic changes again noted. Median sternotomy wires.  IMPRESSION: Interval removal of mediastinal drain with interval decrease in the mediastinal opacity.   Electronically Signed   By: Elgie Collard M.D.   On: 07/27/2014 17:42    Scheduled: . sodium chloride   Intravenous Once  . sodium chloride    Intravenous Once  . acetaminophen  1,000 mg Oral 4 times per day  . amiodarone  200 mg Oral BID PC  . antiseptic oral rinse  7 mL Mouth Rinse BID  . aspirin EC  81 mg Oral Daily  . bisacodyl  10 mg Oral Daily   Or  . bisacodyl  10 mg Rectal Daily  . docusate sodium  200 mg Oral Daily  . pantoprazole  40 mg Oral Daily  . potassium chloride  40 mEq Oral Once  . sodium chloride  3 mL Intravenous Q12H  . [START ON 07/30/2014] warfarin  1 mg Oral q1800  . Warfarin - Physician Dosing Inpatient   Does not apply q1800    Assessment/Plan: 1. AKI/CKD- . Unclear baseline Scr. Likely related to acute decompensated CHF in setting of cardiogenic shock and valvular dysfunction along with Ace-Inhibitor use. Recent constrast exposure 7/17 w/ cath. Good UOP, Cr improving. Able to correct SNa, ok to diurese furthur. GFR slow ly improving not sure where will plateau 1. Renal US without obstruction 2. Will follow potassium and acid base status. 3. Stopped D5W IVFs, restarted on lasix 40mg  po daily per primary team 2. Hypernatremia - resolved, stopped D5W IVF 3. Cardiogenic shock due to acute onset mitral stenosis due to prosthetic valve dysfunction 1. Day 4 /p mitral valve replacement w/ Dr. Barry Dienes 4. H/o MVR with acute mitral stenosis on admission - resolved s/p mitral valve replacement 5. Metabolic acidosis due to #1 resolved with isotonic bicarb gtt.  6. SIRS- off pressors and abx, wound and blood cultures NGTD 7. VDRF- per PCCM, extubated 7/19 7.   Abnormal LFT's- likely 2/2 hypotension from PEA arrest and cardiogenic shock. Trending down 8.   Leukocytosis- trending up, likely post op related   LOS: 8 days   Gara Kroner I have seen and examined this patient and agree with the plan of care seen ,examined, eval, discussed with resident and staff. .  Dalten Ambrosino L 07/29/2014, 1:23 PM

## 2014-07-29 NOTE — Progress Notes (Signed)
07/29/2014 2:32 PM Nursing note Noted pt. Back in NSR rate 60-63. Current external pacer settings VVI at 60 due to hx of PAF post op. Gershon Crane [facility] on floor and made aware. Verbal order received to leave external pacer at current settings in case pt. Returns to Afib. Orders enacted. Pt. Updated on plan of care. Will continue to closely monitor patient.  Loveda Colaizzi, Blanchard Kelch

## 2014-07-29 NOTE — Discharge Instructions (Signed)

## 2014-07-29 NOTE — Plan of Care (Signed)
Problem: Phase II - Intermediate Post-Op Goal: Activity Progressed Outcome: Progressing PT ordered; up to chair several hours during day shift.

## 2014-07-29 NOTE — Progress Notes (Signed)
Utilization review completed.  

## 2014-07-30 ENCOUNTER — Inpatient Hospital Stay (HOSPITAL_COMMUNITY): Payer: Medicare Other

## 2014-07-30 LAB — BASIC METABOLIC PANEL
Anion gap: 11 (ref 5–15)
BUN: 29 mg/dL — AB (ref 6–20)
CHLORIDE: 102 mmol/L (ref 101–111)
CO2: 26 mmol/L (ref 22–32)
Calcium: 9.7 mg/dL (ref 8.9–10.3)
Creatinine, Ser: 1.84 mg/dL — ABNORMAL HIGH (ref 0.44–1.00)
GFR calc Af Amer: 32 mL/min — ABNORMAL LOW (ref >60)
GFR calc non Af Amer: 27 mL/min — ABNORMAL LOW (ref >60)
GLUCOSE: 86 mg/dL (ref 65–99)
POTASSIUM: 3.2 mmol/L — AB (ref 3.5–5.1)
Sodium: 139 mmol/L (ref 135–145)

## 2014-07-30 LAB — CBC
HCT: 31.7 % — ABNORMAL LOW (ref 36.0–46.0)
Hemoglobin: 10.4 g/dL — ABNORMAL LOW (ref 12.0–15.0)
MCH: 28.8 pg (ref 26.0–34.0)
MCHC: 32.8 g/dL (ref 30.0–36.0)
MCV: 87.8 fL (ref 78.0–100.0)
PLATELETS: 146 10*3/uL — AB (ref 150–400)
RBC: 3.61 MIL/uL — AB (ref 3.87–5.11)
RDW: 14.8 % (ref 11.5–15.5)
WBC: 15.4 10*3/uL — ABNORMAL HIGH (ref 4.0–10.5)

## 2014-07-30 LAB — PROTIME-INR
INR: 3.27 — ABNORMAL HIGH (ref 0.00–1.49)
Prothrombin Time: 32.6 seconds — ABNORMAL HIGH (ref 11.6–15.2)

## 2014-07-30 MED ORDER — FUROSEMIDE 80 MG PO TABS
80.0000 mg | ORAL_TABLET | Freq: Two times a day (BID) | ORAL | Status: DC
  Administered 2014-07-30: 80 mg via ORAL
  Administered 2014-07-31: 40 mg via ORAL
  Administered 2014-07-31 – 2014-08-02 (×4): 80 mg via ORAL
  Filled 2014-07-30 (×8): qty 1

## 2014-07-30 MED ORDER — POTASSIUM CHLORIDE CRYS ER 20 MEQ PO TBCR
40.0000 meq | EXTENDED_RELEASE_TABLET | Freq: Two times a day (BID) | ORAL | Status: DC
  Administered 2014-07-30 – 2014-08-02 (×7): 40 meq via ORAL
  Filled 2014-07-30 (×10): qty 2

## 2014-07-30 NOTE — Progress Notes (Signed)
Pt up in recliner 7+ hrs today.  Frequent small voids, always mixed with soft stool.  Pt never thinks she is going to have a BM, however occurs with every void.  Stool softeners held.  Difficult to measure urine output.  No complaints of pain all day, but taking scheduled tylenol.  Several groups of visitors throughout day.  Pt remains pleasant and largely appropriate but with significant memory impairment.  Will con't plan of care.

## 2014-07-30 NOTE — Progress Notes (Addendum)
[address]       [place] [address]             [phone]      5 Days Post-Op Procedure(s) (LRB): REDO MITRAL VALVE REPLACEMENT (MVR) (N/A) Subjective: Feels pretty well, hasn't been walking much, some + prod cough- to get CXR this am  Objective: Vital signs in last 24 hours: Temp:  [97.8 F (36.6 C)-98.8 F (37.1 C)] 98.8 F (37.1 C) (07/23 0347) Pulse Rate:  [62-109] 77 (07/23 0347) Cardiac Rhythm:  [-] Atrial fibrillation (07/22 2009) Resp:  [18-29] 18 (07/23 0347) BP: (110-144)/(59-83) 144/72 mmHg (07/23 0347) SpO2:  [97 %-100 %] 98 % (07/23 0347) Weight:  [179 lb 6.4 oz (81.375 kg)] 179 lb 6.4 oz (81.375 kg) (07/23 0414)  Hemodynamic parameters for last 24 hours:    Intake/Output from previous day: 07/22 0701 - 07/23 0700 In: -  Out: 120 [Chest Tube:120] Intake/Output this shift:    General appearance: alert, cooperative and no distress Heart: regular rate and rhythm and soft syst murmur Lungs: + coarse BS throughout, improves with cough Abdomen: soft, non-tender Extremities: minimal edema Wound: incis healing well  Lab Results:  Recent Labs  07/28/14 0239 07/30/14 0414  WBC 16.1* 15.4*  HGB 10.3* 10.4*  HCT 30.8* 31.7*  PLT 100* 146*   BMET:  Recent Labs  07/29/14 0300 07/30/14 0414  NA 143 139  K 3.0* 3.2*  CL 104 102  CO2 30 26  GLUCOSE 109* 86  BUN 38* 29*  CREATININE 2.17* 1.84*  CALCIUM 9.5 9.7    PT/INR:  Recent Labs  07/30/14 0414  LABPROT 32.6*  INR 3.27*   ABG    Component Value Date/Time   PHART 7.396 07/26/2014 0946   HCO3 24.2* 07/26/2014 0946   TCO2 23 07/26/2014 1607   ACIDBASEDEF 1.0 07/26/2014 0946   O2SAT 99.0 07/26/2014 0946   CBG (last 3)   Recent Labs  07/28/14 1212 07/28/14 1618 07/29/14 2103  GLUCAP 127* 138* 84    Meds Scheduled Meds: . sodium chloride   Intravenous Once  . sodium chloride   Intravenous Once  . acetaminophen  1,000 mg Oral 4 times per day  .  amiodarone  200 mg Oral BID PC  . antiseptic oral rinse  7 mL Mouth Rinse BID  . aspirin EC  81 mg Oral Daily  . atorvastatin  20 mg Oral q1800  . bisacodyl  10 mg Oral Daily   Or  . bisacodyl  10 mg Rectal Daily  . docusate sodium  200 mg Oral Daily  . furosemide  40 mg Oral Daily  . pantoprazole  40 mg Oral Daily  . potassium chloride  20 mEq Oral Daily  . sodium chloride  3 mL Intravenous Q12H  . sodium chloride  3 mL Intravenous Q12H  . warfarin  1 mg Oral q1800  . Warfarin - Physician Dosing Inpatient   Does not apply q1800   Continuous Infusions: . sodium chloride Stopped (07/27/14 1700)   PRN Meds:.sodium chloride, ondansetron (ZOFRAN) IV, sodium chloride, sodium chloride, traMADol  Xrays Dg Chest Port 1 View  07/29/2014   CLINICAL DATA:  Mitral valve replacement.  EXAM: PORTABLE CHEST - 1 VIEW  COMPARISON:  07/28/2014.  FINDINGS: Interim removal of left IJ sheath. Bilateral chest tubes in stable position. No pneumothorax. Prior mitral valve replacement. Heart size stable. Persistent but improving bibasilar subsegmental atelectasis. No pleural effusion. No acute bony abnormality.  IMPRESSION: 1. Interim removal of left IJ sheath. Bilateral chest tubes in stable position. No pneumothorax. 2. Prior mitral valve replacement. Heart size stable. No pulmonary venous congestion. 3. Persistent but improving bibasilar atelectasis.   Electronically Signed   By: Maisie Fus  Register   On: 07/29/2014 07:40    Assessment/Plan: S/P Procedure(s) (LRB): REDO MITRAL VALVE REPLACEMENT (MVR) (N/A)  1 steady overall progress 2 maint sinus rhythm currently, change pacer to VVI 50 3 hold coumadin today 4 cont diuresis and replace K+, excellent diuresis 5 pulm toilet and cardiac rehab- cont routine 6 leukocytosis improved, H/H improved, no fevers  LOS: 9 days    GOLD,WAYNE E 07/30/2014  Agree with above. CXR today shows bibasilar atelectasis, some vascular congestion. Continuing diuresis and K+  replacement. Creat continues to improve.

## 2014-07-30 NOTE — Progress Notes (Signed)
Ms [name] has been incontinent of urine during this shift and has been found wet in the bed several times during the night. The patient was instructed to call when she needs void, she indicated she understood that instruction but fail to call for assistance. On one occasion patient's bed alarm sounded and patient was found trying to get up to the bedside commode. Due incontinence, it has not been possible to record patient's urine output. Will continue to monitor.

## 2014-07-30 NOTE — Progress Notes (Signed)
External pacer changed to VVI @ 50 per order.  Pt currently NSR with HR 86.  Just back from CXR, resting comfortably.  Will con't plan of care.

## 2014-07-30 NOTE — Progress Notes (Signed)
Subjective: Denies SOB and chest pain. Does not know what year it is.   Objective: Vital signs in last 24 hours: Temp:  [97.8 F (36.6 C)-98.8 F (37.1 C)] 98.8 F (37.1 C) (07/23 0347) Pulse Rate:  [62-109] 77 (07/23 0347) Resp:  [18-29] 18 (07/23 0347) BP: (127-144)/(59-83) 144/72 mmHg (07/23 0347) SpO2:  [97 %-100 %] 98 % (07/23 0347) Weight:  [179 lb 6.4 oz (81.375 kg)] 179 lb 6.4 oz (81.375 kg) (07/23 0414) Weight change: 2 lb 5.9 oz (1.075 kg)  Intake/Output from previous day: 07/22 0701 - 07/23 0700 In: -  Out: 120 [Chest Tube:120] Intake/Output this shift:    Physical Exam  Constitutional: She appears well-developed and well-nourished.     Cardiovascular: Normal rate, regular rhythm and intact distal pulses.   Pulmonary/Chest: Effort normal and breath sounds normal. She has no rales.     Abdominal: Soft. Bowel sounds are normal. She exhibits no distension.     Musculoskeletal: She exhibits edema (sacral edema b/Rodriguez, trace pedal edema).     Skin: Skin is warm and dry.  Crisp valve sounds, Gr 2/6 M.    2-3+ edema.   Liver down 5 cm Rales bibasilar Lab Results:  Recent Labs  07/28/14 0239 07/30/14 0414  WBC 16.1* 15.4*  HGB 10.3* 10.4*  HCT 30.8* 31.7*  PLT 100* 146*   BMET:   Recent Labs  07/29/14 0300 07/30/14 0414  NA 143 139  K 3.0* 3.2*  CL 104 102  CO2 30 26  GLUCOSE 109* 86  BUN 38* 29*  CREATININE 2.17* 1.84*  CALCIUM 9.5 9.7   No results for input(s): PTH in the last 72 hours. Iron Studies: No results for input(s): IRON, TIBC, TRANSFERRIN, FERRITIN in the last 72 hours. CBG (last 3)   Recent Labs  07/28/14 1212 07/28/14 1618 07/29/14 2103  GLUCAP 127* 138* 84     Studies/Results: Dg Chest Port 1 View  07/29/2014   CLINICAL DATA:  Mitral valve replacement.  EXAM: PORTABLE CHEST - 1 VIEW  COMPARISON:  07/28/2014.  FINDINGS: Interim removal of left IJ sheath. Bilateral chest tubes in stable position. No pneumothorax. Prior  mitral valve replacement. Heart size stable. Persistent but improving bibasilar subsegmental atelectasis. No pleural effusion. No acute bony abnormality.  IMPRESSION: 1. Interim removal of left IJ sheath. Bilateral chest tubes in stable position. No pneumothorax. 2. Prior mitral valve replacement. Heart size stable. No pulmonary venous congestion. 3. Persistent but improving bibasilar atelectasis.   Electronically Signed   By: Maisie Fus  Register   On: 07/29/2014 07:40    Scheduled: . sodium chloride   Intravenous Once  . sodium chloride   Intravenous Once  . acetaminophen  1,000 mg Oral 4 times per day  . amiodarone  200 mg Oral BID PC  . antiseptic oral rinse  7 mL Mouth Rinse BID  . aspirin EC  81 mg Oral Daily  . atorvastatin  20 mg Oral q1800  . bisacodyl  10 mg Oral Daily   Or  . bisacodyl  10 mg Rectal Daily  . docusate sodium  200 mg Oral Daily  . furosemide  40 mg Oral Daily  . pantoprazole  40 mg Oral Daily  . potassium chloride  40 mEq Oral BID  . sodium chloride  3 mL Intravenous Q12H  . sodium chloride  3 mL Intravenous Q12H  . Warfarin - Physician Dosing Inpatient   Does not apply q1800    Assessment/Plan: 1. AKI/CKD- . Unclear baseline Scr.  Likely related to acute decompensated CHF in setting of cardiogenic shock and valvular dysfunction along with Ace-Inhibitor use. Recent constrast exposure 7/17 w/ cath. Good UOP, Cr improving. Renal US without obstruction Improving GFR , ? baseline 2. Will follow potassium and acid base status, KDur BID 3. Lasix increased to 80mg  BID, volume overloaded, CXR this am reveals pulmonary congestion with kerley B lines and air bronchograms.  Vol xs on exam. 2. Hypernatremia - resolved 3. Cardiogenic shock due to acute onset mitral stenosis due to prosthetic valve dysfunction 1. Day 5 s /p mitral valve replacement w/ Dr. Barry Dienes 4. H/o MVR with acute mitral stenosis on admission - resolved s/p mitral valve replacement 5. Metabolic  acidosis due to #1 resolved with isotonic bicarb gtt.  6. SIRS- off pressors and abx, wound and blood cultures NGTD 7. VDRF- per PCCM, extubated 7/19 7.   Abnormal LFT's- likely 2/2 hypotension from PEA arrest and cardiogenic shock. Trending down 8.   Leukocytosis- trending up, likely post op related   LOS: 9 days   [name] I have seen and examined this patient and agree with the plan of care seen, eval, examined, and discussed with resident.  Counseled patient .  [name] 07/30/2014, 12:09 PM

## 2014-07-30 NOTE — Progress Notes (Signed)
CARDIAC REHAB PHASE I   PRE:  Rate/Rhythm: 83  BP:  Sitting: 128/59     SaO2: 97% ra  MODE:  Ambulation: 56 ft   POST:  Rate/Rhythm: 123  BP:  Sitting: 175/74     SaO2: 96 3:15pm-3:35pm Patient did not tolerate walk well.  Patient utilized rolling walker and x 2 assist. Patient kept stumbling to the right and needed to take numerous rest breaks. Patient complained of shortness of breath and fatigue. Patient left in chair with call bell in reach and friend at bed side.   Parissa Chiao, [employer], [place] 07/30/2014 3:32 PM

## 2014-07-31 LAB — BASIC METABOLIC PANEL
ANION GAP: 9 (ref 5–15)
BUN: 26 mg/dL — ABNORMAL HIGH (ref 6–20)
CO2: 29 mmol/L (ref 22–32)
CREATININE: 1.89 mg/dL — AB (ref 0.44–1.00)
Calcium: 9.5 mg/dL (ref 8.9–10.3)
Chloride: 102 mmol/L (ref 101–111)
GFR calc Af Amer: 31 mL/min — ABNORMAL LOW (ref >60)
GFR calc non Af Amer: 27 mL/min — ABNORMAL LOW (ref >60)
Glucose, Bld: 98 mg/dL (ref 65–99)
Potassium: 3.5 mmol/L (ref 3.5–5.1)
Sodium: 140 mmol/L (ref 135–145)

## 2014-07-31 LAB — PROTIME-INR
INR: 2.43 — ABNORMAL HIGH (ref 0.00–1.49)
Prothrombin Time: 26.1 seconds — ABNORMAL HIGH (ref 11.6–15.2)

## 2014-07-31 MED ORDER — WARFARIN SODIUM 1 MG PO TABS
1.0000 mg | ORAL_TABLET | Freq: Once | ORAL | Status: DC
  Filled 2014-07-31 (×2): qty 1

## 2014-07-31 NOTE — Progress Notes (Signed)
Pt continues to urinate small amounts very frequently.  Often mixed with trace amounts of stool.  Pt initially refused evening dose of lasix, but agreed to take half.  Documented at 40mg .  Will report thoroughly.

## 2014-07-31 NOTE — Progress Notes (Signed)
Subjective: Denies SOB and chest pain. More interactive on exam this morning.   Objective: Vital signs in last 24 hours: Temp:  [98.6 F (37 C)-98.7 F (37.1 C)] 98.6 F (37 C) (07/24 0450) Pulse Rate:  [79-90] 85 (07/24 0450) Resp:  [18] 18 (07/24 0450) BP: (79-140)/(56-72) 140/72 mmHg (07/24 0450) SpO2:  [96 %-100 %] 98 % (07/24 0450) Weight:  [171 lb 12.8 oz (77.928 kg)] 171 lb 12.8 oz (77.928 kg) (07/24 0619) Weight change: -7 lb 9.6 oz (-3.447 kg)  Intake/Output from previous day: 07/23 0701 - 07/24 0700 In: 600 [P.O.:600] Out: 700 [Urine:700] Intake/Output this shift:    Physical Exam  Constitutional: She appears well-developed and well-nourished.     Cardiovascular: Normal rate, regular rhythm and intact distal pulses.   Pulmonary/Chest: Effort normal. She has no wheezes. She has rales (bibasilar).     Abdominal: Soft. Bowel sounds are normal. She exhibits no distension.     Musculoskeletal: She exhibits edema (sacral edema b/l, trace pedal edema).     Skin: Skin is warm and dry.   Lab Results:  Recent Labs  07/30/14 0414  WBC 15.4*  HGB 10.4*  HCT 31.7*  PLT 146*   BMET:   Recent Labs  07/30/14 0414 07/31/14 0329  NA 139 140  K 3.2* 3.5  CL 102 102  CO2 26 29  GLUCOSE 86 98  BUN 29* 26*  CREATININE 1.84* 1.89*  CALCIUM 9.7 9.5   No results for input(s): PTH in the last 72 hours. Iron Studies: No results for input(s): IRON, TIBC, TRANSFERRIN, FERRITIN in the last 72 hours. CBG (last 3)   Recent Labs  07/28/14 1212 07/28/14 1618 07/29/14 2103  GLUCAP 127* 138* 84     Studies/Results: Dg Chest 2 View  07/30/2014   CLINICAL DATA:  Previous cardiac surgery, recent chest tube removal  EXAM: CHEST - 2 VIEW  COMPARISON:  07/29/2014  FINDINGS: The chest tubes have been removed bilaterally. Postsurgical changes are again seen. Bibasilar atelectatic changes are again noted and stable. No recurrent pneumothorax is noted. No acute bony  abnormality is seen.  IMPRESSION: Stable bibasilar atelectasis.  No pneumothorax following chest tube removal.   Electronically Signed   By: Alcide Clever M.D.   On: 07/30/2014 09:21    Scheduled: . sodium chloride   Intravenous Once  . sodium chloride   Intravenous Once  . amiodarone  200 mg Oral BID PC  . antiseptic oral rinse  7 mL Mouth Rinse BID  . aspirin EC  81 mg Oral Daily  . atorvastatin  20 mg Oral q1800  . bisacodyl  10 mg Oral Daily   Or  . bisacodyl  10 mg Rectal Daily  . docusate sodium  200 mg Oral Daily  . furosemide  80 mg Oral BID  . pantoprazole  40 mg Oral Daily  . potassium chloride  40 mEq Oral BID  . sodium chloride  3 mL Intravenous Q12H  . sodium chloride  3 mL Intravenous Q12H  . Warfarin - Physician Dosing Inpatient   Does not apply q1800    Assessment/Plan: 1. AKI/CKD- . Unclear baseline Scr. Likely related to acute decompensated CHF in setting of cardiogenic shock and valvular dysfunction along with Ace-Inhibitor use. Recent constrast exposure 7/17 w/ cath. 1. Continue lasix  BID as she is still +5L up.  2. Nephrology will sign off as renal function improving w/ good UOP. Patient will need follow up with Dr. Darrick Penna for CKD stage 3  Stable at this time will f/u issues as outpatient with chronic kidney dz 2. Hypernatremia - resolved 3. Cardiogenic shock due to acute onset mitral stenosis due to prosthetic valve dysfunction 1. Day 6 s /p mitral valve replacement w/ Dr. Barry Dienes 4. H/o MVR with acute mitral stenosis on admission - resolved s/p mitral valve replacement 5. Metabolic acidosis due to #1 resolved with isotonic bicarb gtt.  6. SIRS- off pressors and abx, wound and blood cultures NGTD 7. VDRF- per PCCM, extubated 7/19 7.   Abnormal LFT's- likely 2/2 hypotension from PEA arrest and cardiogenic shock. Trending down 8.   Leukocytosis- trending up, likely post op related   LOS: 10 days   Gara Kroner I have seen and examined this patient and  agree with the plan of care seen, eval, examined, discussed with resident.  Patient counseled. .  Rosali Augello L 07/31/2014, 11:42 AM

## 2014-07-31 NOTE — Progress Notes (Signed)
Bladder scanned patient after complaint of RUQ pain and voiding occurrence of 100 mls of urine. Post void scan indicates 770 mls. Physician notified orders given.

## 2014-07-31 NOTE — Progress Notes (Signed)
Pt assisted to ambulate hall approx 50 ft, requiring constant encouragement and help with steering the walker.  Eventually wheeled back to room due to Pts insistence she urgently need to void.  To [facility] with family in room after walk.  Will encourage another walk this afternoon.

## 2014-07-31 NOTE — Progress Notes (Signed)
Pt assisted to ambulate hall using RW, and follow behind WC.  Patient was able to double distance from this morning, approx 129ft, and did not sit down.  Still very unsteady, timid, and in need of constant encouragement and balance assistance.    To bed oafter walk, and sat up with tray for dinner.  Call bell in reach.  Will con't plan of care.

## 2014-07-31 NOTE — Progress Notes (Addendum)
[address]       [place] [address]             [phone]      6 Days Post-Op Procedure(s) (LRB): REDO MITRAL VALVE REPLACEMENT (MVR) (N/A) Subjective: Feeling somewhat stronger, ambulating yesterday was "rough"- from being weak  Objective: Vital signs in last 24 hours: Temp:  [98.6 F (37 C)-98.7 F (37.1 C)] 98.6 F (37 C) (07/24 0450) Pulse Rate:  [79-90] 85 (07/24 0450) Cardiac Rhythm:  [-] Normal sinus rhythm (07/24 0832) Resp:  [18] 18 (07/24 0450) BP: (79-140)/(56-72) 140/72 mmHg (07/24 0450) SpO2:  [96 %-100 %] 98 % (07/24 0450) Weight:  [171 lb 12.8 oz (77.928 kg)] 171 lb 12.8 oz (77.928 kg) (07/24 0619)  Hemodynamic parameters for last 24 hours:    Intake/Output from previous day: 07/23 0701 - 07/24 0700 In: 600 [P.O.:600] Out: 700 [Urine:700] Intake/Output this shift:    General appearance: alert, cooperative, fatigued and no distress Heart: regular rate and rhythm and occ extrasystole Lungs: dim in bases Abdomen: benign Extremities: + LE edema Wound: incis healing well  Lab Results:  Recent Labs  07/30/14 0414  WBC 15.4*  HGB 10.4*  HCT 31.7*  PLT 146*   BMET:  Recent Labs  07/30/14 0414 07/31/14 0329  NA 139 140  K 3.2* 3.5  CL 102 102  CO2 26 29  GLUCOSE 86 98  BUN 29* 26*  CREATININE 1.84* 1.89*  CALCIUM 9.7 9.5    PT/INR:  Recent Labs  07/31/14 0329  LABPROT 26.1*  INR 2.43*   ABG    Component Value Date/Time   PHART 7.396 07/26/2014 0946   HCO3 24.2* 07/26/2014 0946   TCO2 23 07/26/2014 1607   ACIDBASEDEF 1.0 07/26/2014 0946   O2SAT 99.0 07/26/2014 0946   CBG (last 3)   Recent Labs  07/28/14 1212 07/28/14 1618 07/29/14 2103  GLUCAP 127* 138* 84    Meds Scheduled Meds: . sodium chloride   Intravenous Once  . sodium chloride   Intravenous Once  . amiodarone  200 mg Oral BID PC  . antiseptic oral rinse  7 mL Mouth Rinse BID  . aspirin EC  81 mg Oral Daily  . atorvastatin  20 mg  Oral q1800  . bisacodyl  10 mg Oral Daily   Or  . bisacodyl  10 mg Rectal Daily  . docusate sodium  200 mg Oral Daily  . furosemide  80 mg Oral BID  . pantoprazole  40 mg Oral Daily  . potassium chloride  40 mEq Oral BID  . sodium chloride  3 mL Intravenous Q12H  . sodium chloride  3 mL Intravenous Q12H  . Warfarin - Physician Dosing Inpatient   Does not apply q1800   Continuous Infusions: . sodium chloride Stopped (07/27/14 1700)   PRN Meds:.sodium chloride, ondansetron (ZOFRAN) IV, sodium chloride, sodium chloride, traMADol  Xrays Dg Chest 2 View  07/30/2014   CLINICAL DATA:  Previous cardiac surgery, recent chest tube removal  EXAM: CHEST - 2 VIEW  COMPARISON:  07/29/2014  FINDINGS: The chest tubes have been removed bilaterally. Postsurgical changes are again seen. Bibasilar atelectatic changes are again noted and stable. No recurrent pneumothorax is noted. No acute bony abnormality is seen.  IMPRESSION: Stable bibasilar atelectasis.  No pneumothorax following chest tube removal.   Electronically Signed   By: Alcide Clever M.D.   On: 07/30/2014 09:21    Assessment/Plan: S/P Procedure(s) (LRB): REDO MITRAL  VALVE REPLACEMENT (MVR) (N/A)  1 steady, somewhat slow progress 2 creat has prob come to baseline though not entirely certain what that is. May be able to decrease diuretics some . Nephrology assisting with management 3 will give 1 mg coumadin today 4 some bursts of afib- on amio  5 push rehab/pulm toilet   LOS: 10 days    GOLD,WAYNE E 07/31/2014   Chart reviewed, patient examined, agree with above.

## 2014-08-01 LAB — URINE MICROSCOPIC-ADD ON

## 2014-08-01 LAB — URINALYSIS, ROUTINE W REFLEX MICROSCOPIC
Bilirubin Urine: NEGATIVE
GLUCOSE, UA: NEGATIVE mg/dL
Ketones, ur: NEGATIVE mg/dL
Nitrite: NEGATIVE
PROTEIN: 30 mg/dL — AB
Specific Gravity, Urine: 1.009 (ref 1.005–1.030)
UROBILINOGEN UA: 0.2 mg/dL (ref 0.0–1.0)
pH: 8 (ref 5.0–8.0)

## 2014-08-01 LAB — PROTIME-INR
INR: 2.07 — ABNORMAL HIGH (ref 0.00–1.49)
Prothrombin Time: 23.1 seconds — ABNORMAL HIGH (ref 11.6–15.2)

## 2014-08-01 MED ORDER — ACETAMINOPHEN 325 MG PO TABS: 650.0000 mg | ORAL_TABLET | ORAL | Status: DC | PRN

## 2014-08-01 MED ORDER — WARFARIN SODIUM 1 MG PO TABS
1.0000 mg | ORAL_TABLET | Freq: Every day | ORAL | Status: DC
  Administered 2014-08-01: 1 mg via ORAL
  Filled 2014-08-01 (×2): qty 1

## 2014-08-01 NOTE — Progress Notes (Addendum)
CARDIAC REHAB PHASE I   PRE:  Rate/Rhythm: 90 SR  BP:  Sitting: 132/72       SaO2: 97RA  MODE:  Ambulation: 116 ft   POST:  Rate/Rhythm: 105 ST  BP:  Sitting: 142/77         SaO2: 97 RA  Pt sitting up in chair, states she is not sure if she can walk with a foley catheter in place. Reassured pt that it is ok to ambulate with catheter. Pt agreeable to walk. Pt min-moderate assist to stand from chair, reminded of sternal precautions. Pt ambulated 116 ft on RA, rolling walker, gait belt, assist x2, followed with wheelchair, slow, irregular gait, tolerated fair. Pt c/o mild DOE, denied cp, dizziness, seated rest x1 at halfway point. Pt sats 92-94% on RA while ambulation, HR low 100s. Pt consistently veers to the right during ambulation, and has difficulty controlling gait, has to sit without much notice. Pt does to have some delayed responses and memory impairment as well. Pt states she will be staying with her son at discharge, pt son came and stated pt will be staying with her daughter at discharge.  If pt does discharge to home, will need significant assistance. 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  Pt to chair after walk, feet elevated, call bell within reach. Encouraged to ambulate two more times today, pt verbalized understanding. Will keep as assist x2 for now.     [phone]  Joylene Grapes, RN, BSN 08/01/2014 11:31 AM

## 2014-08-01 NOTE — Progress Notes (Signed)
      [address]       [place] [address]             [phone]     CARDIOTHORACIC SURGERY PROGRESS NOTE  7 Days Post-Op  S/P Procedure(s) (LRB): REDO MITRAL VALVE REPLACEMENT (MVR) (N/A)  Subjective: Reports feeling well today.  Denies pain, SOB.  Intermittently mildly confused.  Foley catheter replaced overnight for unclear reasons ? Urinary retention.  Objective: Vital signs in last 24 hours: Temp:  [98.6 F (37 C)-99.3 F (37.4 C)] 99.3 F (37.4 C) (07/25 1451) Pulse Rate:  [82-97] 82 (07/25 1451) Cardiac Rhythm:  [-] Normal sinus rhythm (07/25 0832) Resp:  [16-17] 16 (07/25 1451) BP: (103-138)/(62-76) 103/62 mmHg (07/25 1451) SpO2:  [96 %-97 %] 96 % (07/25 0556) Weight:  [76.114 kg (167 lb 12.8 oz)] 76.114 kg (167 lb 12.8 oz) (07/25 0556)  Physical Exam:  Rhythm:   sinus  Breath sounds: clear  Heart sounds:  RRR w/out murmur  Incisions:  Clean and dry  Abdomen:  Soft, non-distended, non-tender  Extremities:  Warm, well-perfused, mild LE edema   Intake/Output from previous day: 07/24 0701 - 07/25 0700 In: 483 [P.O.:480; I.V.:3] Out: 1250 [Urine:1250] Intake/Output this shift: Total I/O In: 483 [P.O.:480; I.V.:3] Out: 1765 [Urine:1765]  Lab Results:  Recent Labs  07/30/14 0414  WBC 15.4*  HGB 10.4*  HCT 31.7*  PLT 146*   BMET:  Recent Labs  07/30/14 0414 07/31/14 0329  NA 139 140  K 3.2* 3.5  CL 102 102  CO2 26 29  GLUCOSE 86 98  BUN 29* 26*  CREATININE 1.84* 1.89*  CALCIUM 9.7 9.5    CBG (last 3)   Recent Labs  07/29/14 2103  GLUCAP 84   PT/INR:   Recent Labs  08/01/14 0359  LABPROT 23.1*  INR 2.07*    CXR:  N/A  Assessment/Plan: S/P Procedure(s) (LRB): REDO MITRAL VALVE REPLACEMENT (MVR) (N/A)  Overall stable now POD7 Maintaining NSR w/ stable BP on amiodarone Renal function appears to be stabilizing Mild intermittent confusion ? Urinary retention Generalize deconditioning   Coumadin 1 mg  tonight  Check UA, urine culture  Recheck WBC tomorrow  Continue lasix  Mobilize  D/C pacing wires tomorrow if rhythm stable and INR < 2.5  Purcell Nails 08/01/2014 6:55 PM

## 2014-08-01 NOTE — Care Management Important Message (Signed)
Important Message  Patient Details  Name: [name] MRN: [id number] Date of Birth: 1945-11-06   Medicare Important Message Given:  Yes-third notification given    Kyla Balzarine 08/01/2014, 2:21 PMImportant Message  Patient Details  Name: [name] MRN: [id number] Date of Birth: 06-Apr-1945   Medicare Important Message Given:  Yes-third notification given    Kyla Balzarine 08/01/2014, 2:21 PM

## 2014-08-01 NOTE — Progress Notes (Signed)
Physical Therapy Treatment Patient Details Name: [name] MRN: [id number] DOB: 04/29/1945 Today's Date: 08/01/2014    History of Present Illness Adm 08-12-2014 AMS, coded x2 with PEA; intubated with ?sepsis; found to have occluding thrombus at mitral valve and underwent emergent redo MVR 7/18; extubated 7/19. +AMS PMH MVR on coumadin, HTN    PT Comments    Daughter present today and confirmed pt did not have memory or balance problems PTA. Pt unable to recall year of her birth date or granddaughter's name (present in room). Requires max cues for recall of sternal precautions and max cues to adhere to precautions with mobility. Could benefit from Speech Therapy Consult and OT consult to address cognition and ADLs. Balance improved compared to 7/22, however remains poor (see below). Recommend comprehensive inpatient rehab (CIR) for post-acute therapy needs.   Follow Up Recommendations  Supervision/Assistance - 24 hour;CIR     Equipment Recommendations  Rolling walker with 5" wheels;3in1 (PT)    Recommendations for Other Services OT consult;Speech consult;Rehab consult     Precautions / Restrictions Precautions Precautions: Sternal;Fall Precaution Comments: Patient unable to recall precautions. The patient needs reinforcement to maintain precautions with functional mobility.      Mobility  Bed Mobility                  Transfers Overall transfer level: Needs assistance Equipment used: Rolling walker (2 wheeled) Transfers: Sit to/from Stand Sit to Stand: Min assist;+2 safety/equipment         General transfer comment: x3 from recliner; Patient continues to require education regarding safe and proper use of DME; assist for forward translation over BOS    Ambulation/Gait Ambulation/Gait assistance: Mod assist Ambulation Distance (Feet): 60 Feet Assistive device: Rolling walker (2 wheeled) (w/c) Gait Pattern/deviations: Step-through pattern;Decreased stride  length;Drifts right/left   Gait velocity interpretation: Below normal speed for age/gender General Gait Details: consistently drifting to her right and initially required assist to control/maneuver RW to avoid objects on her right; progressed to her able to adjust RW once PT pointed out to her the obstacle that she was running into; weak hips and torso make gait unsteady   Stairs            Wheelchair Mobility    Modified Rankin (Stroke Patients Only)       Balance Overall balance assessment: Needs assistance         Standing balance support: No upper extremity supported Standing balance-Leahy Scale: Poor           Rhomberg - Eyes Opened:  (30 seconds with incr sway and min guard) Rhomberg - Eyes Closed:  (10 sec with incr sway and then step posterior; min assist)   High Level Balance Comments: standing withiout UE support; marching x 5 each leg; step fwd-back each leg x 5; with UE support toe and heel raises x 5 with min assist to maintain balance    Cognition Arousal/Alertness: Awake/alert Behavior During Therapy: WFL for tasks assessed/performed Overall Cognitive Status: Impaired/Different from baseline Area of Impairment: Orientation;Attention;Memory;Following commands;Safety/judgement;Awareness;Problem solving Orientation Level: Time Current Attention Level: Sustained Memory: Decreased recall of precautions;Decreased short-term memory Following Commands: Follows one step commands with increased time Safety/Judgement: Decreased awareness of safety;Decreased awareness of deficits Awareness: Intellectual Problem Solving: Slow processing;Difficulty sequencing;Requires verbal cues;Requires tactile cues General Comments: able to openly discuss her decr memory with daughter present and pt acknowledged these deficits; unable to recall grandaughter's name (in room) or the year of her birthdate    Exercises  General Exercises - Lower Extremity Ankle Circles/Pumps:  AROM;10 reps Long Arc Quad: AROM;Both;5 reps Toe Raises: AROM;Both;10 reps;Seated Heel Raises: AROM;Both;10 reps;Seated    General Comments General comments (skin integrity, edema, etc.): Daughter present and confirmed pt's cognition and balance were not issues PTA      Pertinent Vitals/Pain Pain Assessment: No/denies pain    Home Living                      Prior Function            PT Goals (current goals can now be found in the care plan section) Acute Rehab PT Goals Patient Stated Goal: go home with children to stay with her PT Goal Formulation: With patient Time For Goal Achievement: 08/05/14 Potential to Achieve Goals: Good Progress towards PT goals: Progressing toward goals    Frequency  Min 3X/week    PT Plan Discharge plan needs to be updated    Co-evaluation             End of Session Equipment Utilized During Treatment: Gait belt Activity Tolerance: Patient tolerated treatment well Patient left: with call bell/phone within reach;with family/visitor present;in chair;with chair alarm set     Time: 7680-8811 PT Time Calculation (min) (ACUTE ONLY): 31 min  Charges:  $Gait Training: 8-22 mins $Therapeutic Activity: 8-22 mins                    G Codes:      Valincia Touch Aug 27, 2014, 4:19 PM Pager [phone]

## 2014-08-02 ENCOUNTER — Inpatient Hospital Stay (HOSPITAL_COMMUNITY): Payer: Medicare Other

## 2014-08-02 DIAGNOSIS — R5381 Other malaise: Secondary | ICD-10-CM

## 2014-08-02 LAB — BASIC METABOLIC PANEL
Anion gap: 8 (ref 5–15)
BUN: 17 mg/dL (ref 6–20)
CHLORIDE: 100 mmol/L — AB (ref 101–111)
CO2: 28 mmol/L (ref 22–32)
CREATININE: 2.06 mg/dL — AB (ref 0.44–1.00)
Calcium: 9.5 mg/dL (ref 8.9–10.3)
GFR, EST AFRICAN AMERICAN: 28 mL/min — AB (ref >60)
GFR, EST NON AFRICAN AMERICAN: 24 mL/min — AB (ref >60)
GLUCOSE: 110 mg/dL — AB (ref 65–99)
Potassium: 4.1 mmol/L (ref 3.5–5.1)
SODIUM: 136 mmol/L (ref 135–145)

## 2014-08-02 LAB — URINE CULTURE
Culture: NO GROWTH
Special Requests: NORMAL

## 2014-08-02 LAB — CBC
HCT: 26.1 % — ABNORMAL LOW (ref 36.0–46.0)
Hemoglobin: 8.5 g/dL — ABNORMAL LOW (ref 12.0–15.0)
MCH: 29.3 pg (ref 26.0–34.0)
MCHC: 32.6 g/dL (ref 30.0–36.0)
MCV: 90 fL (ref 78.0–100.0)
Platelets: 152 10*3/uL (ref 150–400)
RBC: 2.9 MIL/uL — ABNORMAL LOW (ref 3.87–5.11)
RDW: 15 % (ref 11.5–15.5)
WBC: 11.9 10*3/uL — ABNORMAL HIGH (ref 4.0–10.5)

## 2014-08-02 LAB — PROTIME-INR
INR: 1.58 — ABNORMAL HIGH (ref 0.00–1.49)
Prothrombin Time: 18.9 s — ABNORMAL HIGH (ref 11.6–15.2)

## 2014-08-02 MED ORDER — POTASSIUM CHLORIDE CRYS ER 20 MEQ PO TBCR
20.0000 meq | EXTENDED_RELEASE_TABLET | Freq: Two times a day (BID) | ORAL | Status: DC
  Administered 2014-08-02 – 2014-08-03 (×2): 20 meq via ORAL
  Filled 2014-08-02 (×4): qty 1

## 2014-08-02 MED ORDER — CARVEDILOL 6.25 MG PO TABS
6.2500 mg | ORAL_TABLET | Freq: Two times a day (BID) | ORAL | Status: DC
  Administered 2014-08-02 – 2014-08-03 (×3): 6.25 mg via ORAL
  Filled 2014-08-02 (×4): qty 1

## 2014-08-02 MED ORDER — WARFARIN SODIUM 1 MG PO TABS
1.0000 mg | ORAL_TABLET | Freq: Every day | ORAL | Status: DC
  Administered 2014-08-02 – 2014-08-03 (×2): 1 mg via ORAL
  Filled 2014-08-02 (×2): qty 1

## 2014-08-02 MED ORDER — CIPROFLOXACIN HCL 500 MG PO TABS
500.0000 mg | ORAL_TABLET | Freq: Two times a day (BID) | ORAL | Status: DC
  Administered 2014-08-02 – 2014-08-03 (×2): 500 mg via ORAL
  Filled 2014-08-02 (×4): qty 1

## 2014-08-02 MED ORDER — FUROSEMIDE 40 MG PO TABS
40.0000 mg | ORAL_TABLET | Freq: Two times a day (BID) | ORAL | Status: DC
Start: 2014-08-02 — End: 2014-08-03
  Administered 2014-08-02 – 2014-08-03 (×3): 40 mg via ORAL
  Filled 2014-08-02 (×4): qty 1

## 2014-08-02 MED ORDER — WARFARIN SODIUM 2 MG PO TABS
2.0000 mg | ORAL_TABLET | Freq: Every day | ORAL | Status: DC
  Filled 2014-08-02: qty 1

## 2014-08-02 NOTE — Progress Notes (Signed)
UR Completed. Yazir Koerber, RN, BSN.  [phone] 

## 2014-08-02 NOTE — Progress Notes (Signed)
CARDIAC REHAB PHASE I   PRE:  Rate/Rhythm: 89 SR    BP: sitting 128/68    SaO2: 99 RA  MODE:  Ambulation: 176 ft   POST:  Rate/Rhythm: 106 ST    BP: sitting 107/73     SaO2: 92-95 RA  Pt sts she doesn't feel well today, tired. Able to stand independently from surfaces but continues to struggle with steering and gait. Pt did seem improved on her ability to correct steering today. Used RW, gait belt and assist x2. Rest sitting at halfway and then again after a few steps due to dizziness. BP was lower after returning to room.  Pt seems irritable today and continues to struggle with memory (brother present in room and pt stating she has 3 siblings, which is incorrect). PT in pm, we will continue to follow. To bed to have EPW removed. [phone]  Elissa Lovett [place] CES, ACSM 08/02/2014 10:54 AM

## 2014-08-02 NOTE — Care Management Note (Signed)
Case Management Note  Patient Details  Name: [name] MRN: [id number] Date of Birth: [birth year]  Subjective/Objective:        Pt s/p redo mitral valve replacement with bioprosthetic valve            Action/Plan:  Per pt and daughter [name], pt was independent from home prior to admit.  CIR has been recommended for pt, and are evaluating for appropriateness.  CM will continue to monitor   Expected Discharge Date:                  Expected Discharge Plan:  IP Rehab Facility  In-House Referral:     Discharge planning Services  CM Consult  Post Acute Care Choice:    Choice offered to:     DME Arranged:    DME Agency:     HH Arranged:    HH Agency:     Status of Service:  In process, will continue to follow  Medicare Important Message Given:  Yes-third notification given Date Medicare IM Given:    Medicare IM give by:    Date Additional Medicare IM Given:    Additional Medicare Important Message give by:     If discussed at Long Length of Stay Meetings, dates discussed:  08/02/14  Additional Comments:  Cherylann Parr, RN 08/02/2014, 4:07 PM

## 2014-08-02 NOTE — Progress Notes (Signed)
Rehab admissions - I met with pt in follow up to rehab MD consult to explain the possibility of inpatient rehab. Initial information and rehab brochures were given. Pt then needed to use the restroom urgently and nursing was alerted. I will reach out to pt's dtr by phone.  I will check on pt's status tomorrow and we will consider possible inpatient rehab admit pending her medical clearance and our bed availability. Thanks.  Nanetta Batty, PT Rehabilitation Admissions Coordinator [phone]

## 2014-08-02 NOTE — Progress Notes (Signed)
OT Cancellation Note  Patient Details Name: [name] MRN: [id number] DOB: 04/17/1945   Cancelled Treatment:    Reason Eval/Treat Not Completed: Other (comment). SLP is just now starting their eval with pt, we will re-attempt our eval in AM.  Evette Georges [phone] 08/02/2014, 2:15 PM

## 2014-08-02 NOTE — Discharge Summary (Signed)
Physician Discharge Summary  Patient ID: [name] MRN: [id number] DOB/AGE: [birth year] 66 y.o.  Admit date: 07/21/2014 Discharge date: 08/02/2014  Admission Diagnoses: PEA cardiac arrest  Discharge Diagnoses:  Principal Problem:   S/P redo mitral valve replacement with bioprosthetic valve Active Problems:   S/P mitral valve replacement with a bileaflet mechanical valve   Long term current use of anticoagulant therapy for mechanical mitral valve   HTN (hypertension)   RBBB   Shock   Cardiac arrest   Acute respiratory failure, unspecified whether with hypoxia or hypercapnia   Metabolic acidosis   Prosthetic valve dysfunction   Thrombosis of prosthetic heart valve  Patient Active Problem List   Diagnosis Date Noted  . 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 S/P redo mitral valve replacement with bioprosthetic valve 07/25/2014  . Prosthetic valve dysfunction 07/22/2014  . Shock 07/21/2014  . Cardiac arrest 07/21/2014  . Acute respiratory failure, unspecified whether with hypoxia or hypercapnia   . Metabolic acidosis   . RBBB 03/11/2014  . HTN (hypertension) 01/23/2013  . Dyslipidemia 01/23/2013  . S/P mitral valve replacement with a bileaflet mechanical valve 04/11/1998  . Long term current use of anticoagulant therapy for mechanical mitral valve 04/11/1998   HISTORY OF PRESENT ILLNESS: At time of admission  69 year old female with PMH MVR on coumadin, HTN, and HLD. She is reportedly non-complaint with her medical regimen at times, sometimes making too much, other times not enough. Family reports that she has had generalized weakness and malaise for several days prior to admission. She has also had diarrhea. 7/14 she developed marked confusion and she presented to [facility] ED with these complaints. She was found to be profoundly hypoxemic with lactic acid elevation. Unfortunately this progressed to PEA arrest x 2. Total downtime approximated at about 5-6 mins. ROSC  with epi and bicarb each time. Post arrest she remains with refractory shock despite 4L IVF and high doses of NE and epi. PCCM called for admission.   Discharged Condition: stable to CIR for ongoing rehabilitation  Hospital Course: The patient was admitted to the intensive care unit under the management of critical care medicine as well as cardiology. She was intubated and and resuscitated with volume as well as vasoactive agents. She was initially treated with broad spectrum anti-biotic's. INR was noted to be 2.55 at time of presentation. A transthoracic echocardiogram demonstrated normal left ventricular size and systolic function with an ejection fraction of 55-60%. Motion of the leaflets of the mechanical valve in the mitral position appeared root restricted and there was the appearance of elevation of diastolic gradients suggesting the possibility of prosthetic valve dysfunction. Due to this finding cardiothoracic surgical consultation was obtained and the prosthetic valve dysfunction was also confirmed by fluoroscopy. It was felt she would require redo mitral valve surgery with replacement of the valve. She was stabilized medically for shock, lactic acidosis, acute renal failure, acute liver injury and also for acute metabolic encephalopathy. Nephrology consultation was also obtained to assist with management. She showed improvement and was felt to be stable for proceeding with surgery on 07/25/2014. The following procedure was done:  CARDIOTHORACIC SURGERY OPERATIVE NOTE  Date of Procedure:07/25/2014  Preoperative Diagnosis:  Prosthetic Valve Dysfunction  Acute Severe Mitral Stenosis  Cardiogenic Shock  Postoperative Diagnosis:  Prosthetic Valve Dysfunction due to Valve Thrombosis  Acute Severe Mitral Stenosis  Cardiogenic Shock  Procedure:  Redo Mitral Valve Replacement Edwards Magna Mitral Bovine Bioprosthetic Tissue Valve (size [id number]  #[id number],  serial #[id number])  Surgeon:Clarence H. Cornelius Moras, MD  Assistant:Wayne Clifton Custard, PA-C  Anesthesia:Chris Maple Hudson, MD  Operative Findings:  Thrombosis of mechanical mitral valve prosthesis with acute, subacute and chronic organized thrombus  Severe mitral stenosis  Normal LV systolic funtion  Severe pulmonary hypertension  Trace central aortic insufficiency  Markedly improved pulmonary artery pressures after separation from cardiopulmonary bypass            She tolerated the procedure well was taken to the surgical intensive care unit in stable condition.-  Postoperative hospital course: The patient's overall condition has shown good and gradual improvement. She has had postoperative atrial fibrillation but is mostly in sinus rhythm. She has been treated with amiodarone which is now oral dosing. She did have some difficulty with mucus plugging and atelectasis but this has responded well to pulmonary toilet and suctioning. She has been weaned to room air oxygenation. She has acute on chronic renal insufficiency that appears to be close to her presumed baseline. She had post-op hypernatremia which has resolved.  She has some postoperative volume excess which has improved with diuresis. She has an expected acute blood loss anemia which has stabilized. She has had some difficulty with urine output and we're evaluating her for possible urinary tract infection. She has been started on Cipro, pending urine culture results. She has generalized weakness and deconditioning as well as difficulty with memory and cognition and overall is felt to be a good candidate for further management in the inpatient rehabilitation setting.  Consults:cardiology, pulmonary/intensive care, hematology/oncology and rehabilitation medicine  Significant Diagnostic Studies: angiography: cardiac cath, fleuroscopy of mitral valve  Treatments: surgery: as described above  Discharge  Exam: Blood pressure 115/63, pulse 90, temperature 98.9 F (37.2 C), temperature source Oral, resp. rate 18, height 5\' 6"  (1.676 m), weight 162 lb 0.6 oz (73.5 kg), SpO2 98 %.  General appearance: alert, cooperative and no distress Heart: regular rate and rhythm Lungs: dim in bases Abdomen: soft, non-tender Extremities: + LE edema Wound: incis healing well  Disposition: to CIR  Scheduled meds: . sodium chloride   Intravenous Once  . sodium chloride   Intravenous Once  . amiodarone  200 mg Oral BID PC  . antiseptic oral rinse  7 mL Mouth Rinse BID  . aspirin EC  81 mg Oral Daily  . atorvastatin  20 mg Oral q1800  . bisacodyl  10 mg Oral Daily   Or  . bisacodyl  10 mg Rectal Daily  . carvedilol  6.25 mg Oral BID WC  . ciprofloxacin  500 mg Oral BID  . docusate sodium  200 mg Oral Daily  . furosemide  40 mg Oral BID  . pantoprazole  40 mg Oral Daily  . potassium chloride  20 mEq Oral BID  . sodium chloride  3 mL Intravenous Q12H  . sodium chloride  3 mL Intravenous Q12H  . warfarin  1 mg Oral q1800  . Warfarin - Physician Dosing Inpatient   Does not apply q1800   Continuous Infusions: . sodium chloride Stopped (07/27/14 1700)   PRN Meds:.sodium chloride, acetaminophen, ondansetron (ZOFRAN) IV, sodium chloride, sodium chloride, traMADol    Follow-up : will need 4 week follow-up with surgeon, 2 week follow up with cardiology as well as coumadin clinic to monitor INR/dosing  Signed: GOLD,WAYNE E 08/02/2014, 3:11 PM

## 2014-08-02 NOTE — Progress Notes (Addendum)
[address]       [place] [address]             [phone]      8 Days Post-Op Procedure(s) (LRB): REDO MITRAL VALVE REPLACEMENT (MVR) (N/A) Subjective: Feels stronger than she has since surgery  Objective: Vital signs in last 24 hours: Temp:  [99.2 F (37.3 C)-99.8 F (37.7 C)] 99.2 F (37.3 C) (07/26 0414) Pulse Rate:  [82-84] 84 (07/26 0414) Cardiac Rhythm:  [-] Normal sinus rhythm (07/25 2030) Resp:  [16-18] 18 (07/26 0414) BP: (95-126)/(60-76) 95/60 mmHg (07/26 0414) SpO2:  [98 %] 98 % (07/26 0414) Weight:  [162 lb 0.6 oz (73.5 kg)] 162 lb 0.6 oz (73.5 kg) (07/26 0520)  Hemodynamic parameters for last 24 hours:    Intake/Output from previous day: 07/25 0701 - 07/26 0700 In: 483 [P.O.:480; I.V.:3] Out: 4315 [Urine:4315] Intake/Output this shift:    General appearance: alert, cooperative and no distress Heart: regular rate and rhythm Lungs: dim in the bases Abdomen: soft, nontender Extremities: + LE edema Wound: incis healing well  Lab Results:  Recent Labs  08/02/14 0335  WBC 11.9*  HGB 8.5*  HCT 26.1*  PLT 152   BMET:  Recent Labs  07/31/14 0329 08/02/14 0335  NA 140 136  K 3.5 4.1  CL 102 100*  CO2 29 28  GLUCOSE 98 110*  BUN 26* 17  CREATININE 1.89* 2.06*  CALCIUM 9.5 9.5    PT/INR:  Recent Labs  08/02/14 0335  LABPROT 18.9*  INR 1.58*   ABG    Component Value Date/Time   PHART 7.396 07/26/2014 0946   HCO3 24.2* 07/26/2014 0946   TCO2 23 07/26/2014 1607   ACIDBASEDEF 1.0 07/26/2014 0946   O2SAT 99.0 07/26/2014 0946   CBG (last 3)  No results for input(s): GLUCAP in the last 72 hours.  Meds Scheduled Meds: . sodium chloride   Intravenous Once  . sodium chloride   Intravenous Once  . amiodarone  200 mg Oral BID PC  . antiseptic oral rinse  7 mL Mouth Rinse BID  . aspirin EC  81 mg Oral Daily  . atorvastatin  20 mg Oral q1800  . bisacodyl  10 mg Oral Daily   Or  . bisacodyl  10 mg Rectal  Daily  . docusate sodium  200 mg Oral Daily  . furosemide  80 mg Oral BID  . pantoprazole  40 mg Oral Daily  . potassium chloride  40 mEq Oral BID  . sodium chloride  3 mL Intravenous Q12H  . sodium chloride  3 mL Intravenous Q12H  . warfarin  1 mg Oral q1800  . Warfarin - Physician Dosing Inpatient   Does not apply q1800   Continuous Infusions: . sodium chloride Stopped (07/27/14 1700)   PRN Meds:.sodium chloride, acetaminophen, ondansetron (ZOFRAN) IV, sodium chloride, sodium chloride, traMADol  Xrays No results found.  Assessment/Plan: S/P Procedure(s) (LRB): REDO MITRAL VALVE REPLACEMENT (MVR) (N/A)  1 having som intermit afib, , rapid at times to 120's, PVC's. On amio. Could consider low dose beta blocker but BP does run low at times 2 INR has dropped some- will increase coumadin a little 3 may have UTI, she does have a lesion on ultrasound in right kidney that may need follow-up CT/MRI in future. Will need to determine timing to remove foley. Urine culture is pending. Fever is low grade/slight leukocytosis- improved from the 23rd 4 Creat slightly up today- followed by  nephrology 5 push rehab/pulm toilet as able- she is improving overall strength  LOS: 12 days    GOLD,WAYNE E 08/02/2014  I have seen and examined the patient and agree with the assessment and plan as outlined.  D/C foley cath and pacing wires.  Continue coumadin 1 mg/day.  Falling INR is not surprising since she didn/t get coumadin for 2-3 days over the weekend.  Purcell Nails 08/02/2014 8:53 AM

## 2014-08-02 NOTE — Progress Notes (Signed)
Inpatient Rehabilitation  Patient was screened by Weldon Picking for appropriateness for an Inpatient Acute Rehab consult.  At this time, we are recommending Inpatient Rehab consult.  Please order when you feel appropriate. I have discussed with Avie Arenas, CM.    Weldon Picking PT Inpatient Rehab Admissions Coordinator Cell [phone] Office [phone]

## 2014-08-02 NOTE — Evaluation (Signed)
Speech Language Pathology Evaluation Patient Details Name: [name] MRN: [id number] DOB: 08/22/1945 Today's Date: 08/02/2014 Time: 3013-1438 SLP Time Calculation (min) (ACUTE ONLY): 27 min  Problem List:  Patient Active Problem List   Diagnosis Date Noted  . 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 S/P redo mitral valve replacement with bioprosthetic valve 07/25/2014  . Prosthetic valve dysfunction 07/22/2014  . Shock 07-25-2014  . Cardiac arrest 07/25/14  . Acute respiratory failure, unspecified whether with hypoxia or hypercapnia   . Metabolic acidosis   . RBBB 03/11/2014  . HTN (hypertension) 01/23/2013  . Dyslipidemia 01/23/2013  . S/P mitral valve replacement with a bileaflet mechanical valve 04/11/1998  . Long term current use of anticoagulant therapy for mechanical mitral valve 04/11/1998   Past Medical History:  Past Medical History  Diagnosis Date  . Hypertension   . Hyperlipidemia   . S/P MVR (mitral valve replacement) 04/11/1998    #29 St. Jude bileaflet mechanical valve for bacterial endocarditis  . 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 S/P redo mitral valve replacement with bioprosthetic valve 07/25/2014    27 mm [facility] Mitral bovine bioprosthetic tissue valve   Past Surgical History:  Past Surgical History  Procedure Laterality Date  . Mitral valve replacement  04/11/1998    St. Jude mechanical MVR (severe MR, episode of streptococcal bacterial endocarditis - Dr. Cornelius Moras  . Tubal ligation    . Transthoracic echocardiogram  11/2009    EF=>55%; bi-leaflet St. Jude mech prosthesis; mild TR, RVSP elevated at 40-55mmHg, mod pulm HTN; trace AV regurg; mild pulm valve regurg  . Total abdominal hysterectomy w/ bilateral salpingoophorectomy   12/14/1998  . Cardiac catheterization N/A 07/24/2014    Procedure: Right/Left Heart Cath and Coronary Angiography;  Surgeon: Iran Ouch, MD;  Location: [facility] INVASIVE CV LAB;  Service: Cardiovascular;  Laterality: N/A;  . Cardiac catheterization N/A 07/22/2014    Procedure: Fluoroscopy Guidance;  Surgeon: Lennette Bihari, MD;  Location: [facility] INVASIVE CV LAB;  Service: Cardiovascular;  Laterality: N/A;  . Mitral valve replacement N/A 07/25/2014    Procedure: REDO MITRAL VALVE REPLACEMENT (MVR);  Surgeon: Purcell Nails, MD;  Location: [facility] OR;  Service: Open Heart Surgery;  Laterality: N/A;   HPI:  Adm 07-25-2014 AMS, coded x2 with PEA; intubated with ?sepsis; found to have occluding thrombus at mitral valve and underwent emergent redo MVR 7/18; extubated 7/19. +AMS PMH MVR on coumadin, HTN   Assessment / Plan / Recommendation Clinical Impression  Pt has marked difficulty with storage and retrieval of new information, due in large part to reduced sustained attention to functional and verbal tasks as well as decreased intellectual and emergent awareness of deficits. She requires Mod-Max cues for problem solving through common, familiar tasks. PTA, pt was independent per family. Recommend acute SLP f/u as well as CIR consult to maximize cognitive recovery.    SLP Assessment  Patient needs continued Speech Lanaguage Pathology Services    Follow Up Recommendations  Inpatient Rehab;24 hour supervision/assistance    Frequency and Duration min 2x/week  2 weeks   Pertinent Vitals/Pain Pain Assessment: No/denies pain   SLP Goals  Patient/Family Stated Goal: none stated Potential to Achieve Goals (ACUTE ONLY): Good Potential Considerations (ACUTE ONLY): Ability to learn/carryover information  SLP Evaluation Prior Functioning  Cognitive/Linguistic Baseline: Within functional limits Type of Home: Other(Comment) (townhouse)  Lives With: Alone Available Help at Discharge: Family;Available 24 hours/day    Cognition  Overall Cognitive Status: Impaired/Different from baseline Arousal/Alertness: Awake/alert Orientation Level: Oriented to person;Oriented to place;Oriented to situation;Disoriented to time Attention: Sustained Sustained Attention: Impaired Sustained Attention Impairment: Functional basic;Verbal basic Memory: Impaired Memory Impairment: Storage deficit;Retrieval deficit;Decreased recall of new information Awareness: Impaired Awareness Impairment: Intellectual impairment;Emergent impairment;Anticipatory impairment Problem Solving: Impaired Problem Solving Impairment: Functional basic Safety/Judgment: Impaired    Comprehension  Auditory Comprehension Overall Auditory Comprehension: Impaired Yes/No Questions: Impaired Paragraph Comprehension (via yes/no questions): 26-50% accurate Commands: Impaired One Step Basic Commands: 75-100% accurate Two Step Basic Commands: 75-100% accurate Multistep Basic Commands: 50-74% accurate Conversation: Simple Interfering Components: Attention;Working memory Reading Comprehension Reading Status: Impaired Functional Environmental (signs, name badge): Impaired (menu) Interfering Components: Attention;Working [employer] fast streamer    Expression Expression Primary Mode of Expression: Verbal Verbal Expression Overall Verbal Expression: Appears within functional limits for tasks assessed   Oral / Motor Oral Motor/Sensory Function Overall Oral Motor/Sensory Function: Appears within functional limits for tasks assessed Motor Speech Overall Motor Speech: Appears within functional limits for tasks assessed    Maxcine Ham, M.A. CCC-SLP [phone]  Maxcine Ham 08/02/2014, 2:48 PM

## 2014-08-02 NOTE — Progress Notes (Signed)
Physical Therapy Treatment Patient Details Name: [name] MRN: [id number] DOB: 09-01-1945 Today's Date: 08/02/2014    History of Present Illness Adm 2014/08/19 AMS, coded x2 with PEA; intubated with ?sepsis; found to have occluding thrombus at mitral valve and underwent emergent redo MVR 7/18; extubated 7/19. +AMS PMH MVR on coumadin, HTN    PT Comments    Patient easily frustrated/angered this PM. She reports fatigue and never having time to rest. Continue to recommend comprehensive inpatient rehab (CIR) for post-acute therapy needs. Patient continues to require education regarding safe and proper use of DME.    Follow Up Recommendations  Supervision/Assistance - 24 hour;CIR     Equipment Recommendations  Rolling walker with 5" wheels;3in1 (PT)    Recommendations for Other Services       Precautions / Restrictions Precautions Precautions: Sternal;Fall Precaution Comments: Patient unable to recall precautions. The patient needs reinforcement to maintain precautions with functional mobility.      Mobility  Bed Mobility Overal bed mobility: Needs Assistance Bed Mobility: Sit to Sidelying Rolling: Min guard       Sit to sidelying: Min guard General bed mobility comments: max cues for technique to maintain sternal precautions (pt unable to recall any part of process) difficulty raising legs with nearly needing assist  Transfers Overall transfer level: Needs assistance Equipment used: None Transfers: Sit to/from [employer] Sit to Stand: Min guard Stand pivot transfers: Min guard       General transfer comment: pt insisted on BSC in front of her (required 180* turn) vs beside her (90* turn); transfers x 3  Ambulation/Gait Ambulation/Gait assistance: Min assist Ambulation Distance (Feet): 30 Feet Assistive device: None Gait Pattern/deviations: Step-through pattern;Decreased stride length;Wide base of support Gait velocity: decr   General Gait  Details: without RW also drifts slightly to her right, especially as turning right; incr lateral sway   Stairs            Wheelchair Mobility    Modified Rankin (Stroke Patients Only)       Balance           Standing balance support: No upper extremity supported Standing balance-Leahy Scale: Poor                 High Level Balance Comments: pt refused balance or standing exercises; frustrated/fatigued    Cognition Arousal/Alertness: Awake/alert Behavior During Therapy: WFL for tasks assessed/performed Overall Cognitive Status: Impaired/Different from baseline Area of Impairment: Orientation;Attention;Memory;Following commands;Safety/judgement;Awareness;Problem solving Orientation Level: Time Current Attention Level: Sustained Memory: Decreased recall of precautions;Decreased short-term memory Following Commands: Follows one step commands with increased time Safety/Judgement: Decreased awareness of safety;Decreased awareness of deficits Awareness: Intellectual Problem Solving: Slow processing;Difficulty sequencing;Requires verbal cues;Requires tactile cues General Comments: sitting EOB alone and about to get to [facility] alone; easily frustrated (? due to earlier SLP cognitive eval)    Exercises      General Comments        Pertinent Vitals/Pain Pain Assessment: No/denies pain    Home Living     Available Help at Discharge: Family;Available 24 hours/day Type of Home: Other(Comment) (townhouse)              Prior Function            PT Goals (current goals can now be found in the care plan section) Acute Rehab PT Goals Patient Stated Goal: go home with children to stay with her PT Goal Formulation: With patient Time For Goal Achievement: 08/05/14 Potential to Achieve  Goals: Good Progress towards PT goals: Progressing toward goals    Frequency  Min 3X/week    PT Plan Current plan remains appropriate    Co-evaluation              End of Session Equipment Utilized During Treatment: Gait belt Activity Tolerance: Patient limited by fatigue Patient left: with call bell/phone within reach;in bed;with bed alarm set     Time: 1550-1607 PT Time Calculation (min) (ACUTE ONLY): 17 min  Charges:  $Gait Training: 8-22 mins                    G Codes:      Destyni Hoppel 29-Aug-2014, 4:25 PM Pager [phone]

## 2014-08-02 NOTE — Consult Note (Signed)
Physical Medicine and Rehabilitation Consult Reason for Consult: Debilitated after cardiac arrest status post redo mitral valve replacement Referring Physician: Cornelius Moras   HPI: [name] is a 69 y.o. right handed female with history of hypertension, remote history of endocarditis with destruction of mitral valve requiring St. Jude mechanical valve placement by Dr. Cornelius Moras in April 2000 maintained on chronic Coumadin. Independent prior to admission living alone. Presented 07/21/2014 with altered mental status and  malaise and witnessed PEA arrest in the ED with bradycardia down to the 20s. Patient was severely diaphoretic on arrival. Noted creatinine 3.57. Troponin 0.95. EKG showed right bundle branch block and left anterior fascicular block consistent with previous EKG from 03/11/2014.Marland Kitchen Renal ultrasound with no hydronephrosis. Cardiology as well as critical care medicine follow-up for septic shock and cardiac arrest. Patient required full intubation. TEE completed showing ejection fraction of 55% motions of the leaflets of the mechanical valve in the mitral position appeared restricted. Underwent redo mitral valve replacement 07/25/2014 per Dr. Cornelius Moras. Hospital course pain management. Renal function slowly improving creatinine 2.06. Chronic Coumadin ongoing. Patient was extubated 07/26/2014. Tolerating a regular diet. Physical therapy evaluation completed and ongoing noting debilitation as well as some difficulty with recall of events as well as memory deficits with recommendations of physical medicine rehabilitation consult.    Review of Systems  Constitutional: Positive for malaise/fatigue. Negative for fever and chills.  HENT: Negative for hearing loss.   Respiratory: Positive for shortness of breath. Negative for cough and wheezing.   Cardiovascular: Positive for chest pain and palpitations.  Gastrointestinal: Positive for heartburn and constipation. Negative for abdominal pain.    Genitourinary: Negative for dysuria and urgency.  Musculoskeletal: Positive for myalgias. Negative for falls.  Skin: Negative for rash.  Neurological: Positive for dizziness and weakness. Negative for seizures and headaches.   Past Medical History  Diagnosis Date  . Hypertension   . Hyperlipidemia   . S/P MVR (mitral valve replacement) 04/11/1998    #29 St. Jude bileaflet mechanical valve for bacterial endocarditis  . 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 S/P redo mitral valve replacement with bioprosthetic valve 07/25/2014    27 mm [facility] Mitral bovine bioprosthetic tissue valve   Past Surgical History  Procedure Laterality Date  . Mitral valve replacement  04/11/1998    St. Jude mechanical MVR (severe MR, episode of streptococcal bacterial endocarditis - Dr. Cornelius Moras  . Tubal ligation    . Transthoracic echocardiogram  11/2009    EF=>55%; bi-leaflet St. Jude mech prosthesis; mild TR, RVSP elevated at 40-33mmHg, mod pulm HTN; trace AV regurg; mild pulm valve regurg  . Total abdominal hysterectomy w/ bilateral salpingoophorectomy  12/14/1998  . Cardiac catheterization N/A 07/24/2014    Procedure: Right/Left Heart Cath and Coronary Angiography;  Surgeon: Iran Ouch, MD;  Location: [facility];  Service: Cardiovascular;  Laterality: N/A;  . Cardiac catheterization N/A 07/22/2014    Procedure: Fluoroscopy Guidance;  Surgeon: Lennette Bihari, MD;  Location: [facility] CV [facility];  Service: Cardiovascular;  Laterality: N/A;  . Mitral valve replacement N/A 07/25/2014    Procedure: REDO MITRAL VALVE REPLACEMENT (MVR);  Surgeon: Purcell Nails, MD;  Location: [facility] OR;  Service: Open Heart Surgery;  Laterality: N/A;   Family History  Problem Relation Age  of Onset  . Lung cancer  Mother   . Heart failure Maternal Grandmother   . Heart failure Maternal Grandfather    Social History:  reports that she quit smoking about 41 years ago. She has never used smokeless tobacco. She reports that she does not drink alcohol or use illicit drugs. Allergies:  Allergies  Allergen Reactions  . Augmentin [Amoxicillin-Pot Clavulanate]    Medications Prior to Admission  Medication Sig Dispense Refill  . carvedilol (COREG) 6.25 MG tablet Take 1 tablet (6.25 mg total) by mouth 2 (two) times daily with a meal. 60 tablet 11  . lisinopril (PRINIVIL,ZESTRIL) 20 MG tablet Take 1 tablet (20 mg total) by mouth daily. 30 tablet 11  . simvastatin (ZOCOR) 40 MG tablet Take 1 tablet (40 mg total) by mouth daily. 30 tablet 11  . triamterene-hydrochlorothiazide (DYAZIDE) 37.5-25 MG per capsule Take 1 each (1 capsule total) by mouth daily. 30 capsule 11  . warfarin (COUMADIN) 3 MG tablet TAKE ONE TO ONE & ONE-HALF TABLETS BY MOUTH ONCE DAILY AS DIRECTED 120 tablet 0    Home: Home Living Family/patient expects to be discharged to:: Private residence Living Arrangements: Alone Available Help at Discharge: Family, Available 24 hours/day Type of Home: Other(Comment) (townhouse) Home Access: Level entry Home Layout: One level Bathroom Shower/Tub: Tub/shower unit, Engineer, building services: Standard Home Equipment: None  Functional History: Prior Function Level of Independence: Independent Comments: retired from [employer] Functional Status:  Mobility: Bed Mobility Overal bed mobility: Needs Assistance, +2 for physical assistance, + 2 for safety/equipment Bed Mobility: Rolling, Sit to Sidelying Rolling: Min assist Sit to sidelying: Mod assist, +2 for safety/equipment General bed mobility comments: back to bed Transfers Overall transfer level: Needs assistance Equipment used: Rolling walker (2 wheeled) Transfers: Sit to/from Stand Sit to Stand: Min assist, +2 safety/equipment General transfer  comment: x3 from recliner;  Ambulation/Gait Ambulation/Gait assistance: Mod assist Ambulation Distance (Feet): 60 Feet Assistive device: Rolling walker (2 wheeled) (w/c) Gait Pattern/deviations: Step-through pattern, Decreased stride length, Drifts right/left General Gait Details: consistently drifting to her right and initially required assist to control/maneuver RW to avoid objects on her right; progressed to her able to adjust RW once PT pointed out to her the obstacle that she was running into; weak hips and torso make gait unsteady Gait velocity interpretation: Below normal speed for age/gender    ADL:    Cognition: Cognition Overall Cognitive Status: Impaired/Different from baseline Orientation Level: Oriented X4 Cognition Arousal/Alertness: Awake/alert Behavior During Therapy: WFL for tasks assessed/performed Overall Cognitive Status: Impaired/Different from baseline Area of Impairment: Orientation, Attention, Memory, Following commands, Safety/judgement, Awareness, Problem solving Orientation Level: Time Current Attention Level: Sustained Memory: Decreased recall of precautions, Decreased short-term memory Following Commands: Follows one step commands with increased time Safety/Judgement: Decreased awareness of safety, Decreased awareness of deficits Awareness: Intellectual Problem Solving: Slow processing, Difficulty sequencing, Requires verbal cues, Requires tactile cues General Comments: able to openly discuss her decr memory with daughter present and pt acknowledged these deficits; unable to recall grandaughter's name (in room) or the year of her birthdate  Blood pressure 118/72, pulse 84, temperature 99.2 F (37.3 C), temperature source Oral, resp. rate 18, height  (1.676 m), weight 73.5 kg (162 lb 0.6 oz), SpO2 100 %. Physical Exam  Constitutional: She appears well-developed.  HENT:  Head: Normocephalic.  Eyes: EOM are normal.  Neck: Normal range of motion.  Neck supple. No thyromegaly present.  Cardiovascular:  Cardiac rate controlled  Respiratory: Effort normal and breath sounds  normal. No respiratory distress.  GI: Soft. Bowel sounds are normal. She exhibits no distension.  Neurological: She is alert.  Mood is flat but appropriate. She makes good eye contact with examiner. She was able to provide her age but needed some cues for dates. Fair insight and awareness. Follows simple commands. UE 4/5 prox to distal with inhibition due to pain/sternum. LE: 2+ hf, 3ke, 4/5 adf/afp. Decreased sensation in distal feet/toes.   Skin: Skin is warm and dry.  Psychiatric: She has a normal mood and affect.    Results for orders placed or performed during the hospital encounter of 07/21/14 (from the past 24 hour(s))  Protime-INR     Status: Abnormal   Collection Time: 08/02/14  3:35 AM  Result Value Ref Range   Prothrombin Time 18.9 (H) 11.6 - 15.2 seconds   INR 1.58 (H) 0.00 - 1.49  Basic metabolic panel     Status: Abnormal   Collection Time: 08/02/14  3:35 AM  Result Value Ref Range   Sodium 136 135 - 145 mmol/L   Potassium 4.1 3.5 - 5.1 mmol/L   Chloride 100 (L) 101 - 111 mmol/L   CO2 28 22 - 32 mmol/L   Glucose, Bld 110 (H) 65 - 99 mg/dL   BUN 17 6 - 20 mg/dL   Creatinine, Ser 2.42 (H) 0.44 - 1.00 mg/dL   Calcium 9.5 8.9 - 68.3 mg/dL   GFR calc non Af Amer 24 (L) >60 mL/min   GFR calc Af Amer 28 (L) >60 mL/min   Anion gap 8 5 - 15  CBC     Status: Abnormal   Collection Time: 08/02/14  3:35 AM  Result Value Ref Range   WBC 11.9 (H) 4.0 - 10.5 K/uL   RBC 2.90 (L) 3.87 - 5.11 MIL/uL   Hemoglobin 8.5 (L) 12.0 - 15.0 g/dL   HCT 41.9 (L) 62.2 - 29.7 %   MCV 90.0 78.0 - 100.0 fL   MCH 29.3 26.0 - 34.0 pg   MCHC 32.6 30.0 - 36.0 g/dL   RDW 98.9 21.1 - 94.1 %   Platelets 152 150 - 400 K/uL   Dg Chest 2 View  08/02/2014   CLINICAL DATA:  Follow-up atelectasis, status post mitral valve replacement on July 25, 2014.  EXAM: CHEST  2 VIEW   COMPARISON:  PA and lateral chest x-ray of July 30, 2014.  FINDINGS: The lungs are reasonably well inflated. There is no pneumothorax. There are stable bilateral pleural effusions. The pulmonary interstitial markings remain increased diffusely. There is stable mild bibasilar subsegmental atelectasis. The cardiac silhouette is enlarged. The central pulmonary vascularity is less prominent today. There are 8 intact sternal wires. The prosthetic mitral valve ring is visible. The bony thorax exhibits no acute abnormality.  IMPRESSION: Persistent bilateral pleural effusions, bibasilar atelectasis, and mild pulmonary interstitial edema. The pulmonary vascular engorgement has decreased slightly.   Electronically Signed   By: David  [place] M.D.   On: 08/02/2014 08:19    Assessment/Plan: Diagnosis: debility after recent cardiac arrest/sepsis, subsequent MV re-do 1. Does the need for close, 24 hr/day medical supervision in concert with the patient's rehab needs make it unreasonable for this patient to be served in a less intensive setting? Yes 2. Co-Morbidities requiring supervision/potential complications: htn, MVR,  3. Due to bladder management, bowel management, safety, skin/wound care, disease management, medication administration, pain management and patient education, does the patient require 24 hr/day rehab nursing? Yes 4. Does the patient require coordinated care  of a physician, rehab nurse, PT (1-2 hrs/day, 5 days/week) and OT (1-2 hrs/day, 5 days/week) to address physical and functional deficits in the context of the above medical diagnosis(es)? Yes Addressing deficits in the following areas: balance, endurance, locomotion, strength, transferring, bowel/bladder control, bathing, dressing, feeding, grooming, toileting and psychosocial support 5. Can the patient actively participate in an intensive therapy program of at least 3 hrs of therapy per day at least 5 days per week? Yes 6. The potential for patient  to make measurable gains while on inpatient rehab is excellent 7. Anticipated functional outcomes upon discharge from inpatient rehab are modified independent  with PT, modified independent with OT, n/a with SLP. 8. Estimated rehab length of stay to reach the above functional goals is: 7 days 9. Does the patient have adequate social supports and living environment to accommodate these discharge functional goals? Yes 10. Anticipated D/C setting: Home 11. Anticipated post D/C treatments: HH therapy 12. Overall Rehab/Functional Prognosis: excellent  RECOMMENDATIONS: This patient's condition is appropriate for continued rehabilitative care in the following setting: CIR Patient has agreed to participate in recommended program. Yes Note that insurance prior authorization may be required for reimbursement for recommended care.  Comment: Rehab Admissions Coordinator to follow up.  Thanks,  Ranelle Oyster, MD, [place] [facility]     08/02/2014

## 2014-08-02 NOTE — Progress Notes (Signed)
Called Dr.Owen at the office because he was the one to see the patient this morning.  The patient is making frequent trips to the bedside commode and urinating a little amount each time. 25 to 35 ml with each urination.  The patient has become frustrated with her frequent trips.  Dr. Barry Dienes stated he would look at the urine culture collected yesterday and order something that would help.

## 2014-08-02 NOTE — Progress Notes (Signed)
Epicardial pacing wires X2 removed per protocol. Vital signs and cardiac rhythm stable pre and post removal. Pt tolerated well. She is to be on bedrest for 1 hour with VS monitoring. Pt educated. Nurse informed.

## 2014-08-03 ENCOUNTER — Telehealth: Payer: Self-pay | Admitting: Internal Medicine

## 2014-08-03 ENCOUNTER — Inpatient Hospital Stay (HOSPITAL_COMMUNITY)
Admission: AD | Admit: 2014-08-03 | Discharge: 2014-08-10 | DRG: 091 | Disposition: A | Discharge status: Home Health | Payer: Medicare Other | Source: Intra-hospital | Attending: Physical Medicine & Rehabilitation | Admitting: Physical Medicine & Rehabilitation

## 2014-08-03 DIAGNOSIS — I129 Hypertensive chronic kidney disease with stage 1 through stage 4 chronic kidney disease, or unspecified chronic kidney disease: Secondary | ICD-10-CM | POA: Diagnosis present

## 2014-08-03 DIAGNOSIS — Z8674 Personal history of sudden cardiac arrest: Secondary | ICD-10-CM

## 2014-08-03 DIAGNOSIS — Z952 Presence of prosthetic heart valve: Secondary | ICD-10-CM | POA: Diagnosis not present

## 2014-08-03 DIAGNOSIS — I5031 Acute diastolic (congestive) heart failure: Secondary | ICD-10-CM | POA: Diagnosis not present

## 2014-08-03 DIAGNOSIS — Z7901 Long term (current) use of anticoagulants: Secondary | ICD-10-CM | POA: Diagnosis not present

## 2014-08-03 DIAGNOSIS — Z954 Presence of other heart-valve replacement: Secondary | ICD-10-CM

## 2014-08-03 DIAGNOSIS — R197 Diarrhea, unspecified: Secondary | ICD-10-CM | POA: Diagnosis present

## 2014-08-03 DIAGNOSIS — N189 Chronic kidney disease, unspecified: Secondary | ICD-10-CM | POA: Diagnosis present

## 2014-08-03 DIAGNOSIS — I4891 Unspecified atrial fibrillation: Secondary | ICD-10-CM | POA: Diagnosis present

## 2014-08-03 DIAGNOSIS — N179 Acute kidney failure, unspecified: Secondary | ICD-10-CM | POA: Diagnosis present

## 2014-08-03 DIAGNOSIS — I5030 Unspecified diastolic (congestive) heart failure: Secondary | ICD-10-CM | POA: Diagnosis not present

## 2014-08-03 DIAGNOSIS — I5033 Acute on chronic diastolic (congestive) heart failure: Secondary | ICD-10-CM | POA: Diagnosis present

## 2014-08-03 DIAGNOSIS — G931 Anoxic brain damage, not elsewhere classified: Principal | ICD-10-CM | POA: Diagnosis present

## 2014-08-03 DIAGNOSIS — E785 Hyperlipidemia, unspecified: Secondary | ICD-10-CM | POA: Diagnosis present

## 2014-08-03 DIAGNOSIS — R5381 Other malaise: Secondary | ICD-10-CM | POA: Diagnosis present

## 2014-08-03 DIAGNOSIS — D62 Acute posthemorrhagic anemia: Secondary | ICD-10-CM | POA: Diagnosis present

## 2014-08-03 DIAGNOSIS — I469 Cardiac arrest, cause unspecified: Secondary | ICD-10-CM | POA: Diagnosis not present

## 2014-08-03 LAB — BASIC METABOLIC PANEL
Anion gap: 7 (ref 5–15)
BUN: 16 mg/dL (ref 6–20)
CALCIUM: 10.1 mg/dL (ref 8.9–10.3)
CO2: 27 mmol/L (ref 22–32)
Chloride: 102 mmol/L (ref 101–111)
Creatinine, Ser: 2.08 mg/dL — ABNORMAL HIGH (ref 0.44–1.00)
GFR calc non Af Amer: 24 mL/min — ABNORMAL LOW (ref >60)
GFR, EST AFRICAN AMERICAN: 27 mL/min — AB (ref >60)
Glucose, Bld: 110 mg/dL — ABNORMAL HIGH (ref 65–99)
Potassium: 4 mmol/L (ref 3.5–5.1)
Sodium: 136 mmol/L (ref 135–145)

## 2014-08-03 LAB — PROTIME-INR
INR: 1.63 — ABNORMAL HIGH (ref 0.00–1.49)
PROTHROMBIN TIME: 19.3 s — AB (ref 11.6–15.2)

## 2014-08-03 MED ORDER — ONDANSETRON HCL 4 MG PO TABS: 4.0000 mg | ORAL_TABLET | Freq: Four times a day (QID) | ORAL | Status: DC | PRN

## 2014-08-03 MED ORDER — CETYLPYRIDINIUM CHLORIDE 0.05 % MT LIQD
7.0000 mL | Freq: Two times a day (BID) | OROMUCOSAL | Status: DC
  Administered 2014-08-03 – 2014-08-10 (×12): 7 mL via OROMUCOSAL

## 2014-08-03 MED ORDER — TRAMADOL HCL 50 MG PO TABS: 50.0000 mg | ORAL_TABLET | Freq: Two times a day (BID) | ORAL | Status: DC | PRN

## 2014-08-03 MED ORDER — AMIODARONE HCL 200 MG PO TABS
200.0000 mg | ORAL_TABLET | Freq: Two times a day (BID) | ORAL | Status: DC
  Administered 2014-08-04 – 2014-08-10 (×13): 200 mg via ORAL
  Filled 2014-08-03 (×14): qty 1

## 2014-08-03 MED ORDER — ACETAMINOPHEN 325 MG PO TABS: 650.0000 mg | ORAL_TABLET | ORAL | Status: DC | PRN

## 2014-08-03 MED ORDER — SORBITOL 70 % SOLN: 30.0000 mL | Freq: Every day | Status: DC | PRN

## 2014-08-03 MED ORDER — BISACODYL 10 MG RE SUPP
10.0000 mg | Freq: Every day | RECTAL | Status: DC
  Filled 2014-08-03: qty 1

## 2014-08-03 MED ORDER — FUROSEMIDE 40 MG PO TABS
40.0000 mg | ORAL_TABLET | Freq: Two times a day (BID) | ORAL | Status: DC
  Administered 2014-08-04 – 2014-08-08 (×10): 40 mg via ORAL
  Filled 2014-08-03 (×12): qty 1

## 2014-08-03 MED ORDER — ATORVASTATIN CALCIUM 20 MG PO TABS
20.0000 mg | ORAL_TABLET | Freq: Every day | ORAL | Status: DC
  Administered 2014-08-04 – 2014-08-09 (×6): 20 mg via ORAL
  Filled 2014-08-03 (×6): qty 1

## 2014-08-03 MED ORDER — PANTOPRAZOLE SODIUM 40 MG PO TBEC
40.0000 mg | DELAYED_RELEASE_TABLET | Freq: Every day | ORAL | Status: DC
  Administered 2014-08-03 – 2014-08-10 (×8): 40 mg via ORAL
  Filled 2014-08-03 (×8): qty 1

## 2014-08-03 MED ORDER — POTASSIUM CHLORIDE CRYS ER 20 MEQ PO TBCR
20.0000 meq | EXTENDED_RELEASE_TABLET | Freq: Two times a day (BID) | ORAL | Status: DC
  Administered 2014-08-03 – 2014-08-10 (×14): 20 meq via ORAL
  Filled 2014-08-03 (×15): qty 1

## 2014-08-03 MED ORDER — ONDANSETRON HCL 4 MG/2ML IJ SOLN: 4.0000 mg | Freq: Four times a day (QID) | INTRAMUSCULAR | Status: DC | PRN

## 2014-08-03 MED ORDER — DOCUSATE SODIUM 100 MG PO CAPS
200.0000 mg | ORAL_CAPSULE | Freq: Every day | ORAL | Status: DC
Start: 2014-08-03 — End: 2014-08-04
  Administered 2014-08-03: 200 mg via ORAL
  Filled 2014-08-03: qty 2

## 2014-08-03 MED ORDER — ASPIRIN EC 81 MG PO TBEC
81.0000 mg | DELAYED_RELEASE_TABLET | Freq: Every day | ORAL | Status: DC
  Administered 2014-08-04 – 2014-08-10 (×7): 81 mg via ORAL
  Filled 2014-08-03 (×8): qty 1

## 2014-08-03 MED ORDER — CARVEDILOL 6.25 MG PO TABS
6.2500 mg | ORAL_TABLET | Freq: Two times a day (BID) | ORAL | Status: DC
  Administered 2014-08-04 – 2014-08-10 (×12): 6.25 mg via ORAL
  Filled 2014-08-03 (×13): qty 1

## 2014-08-03 MED ORDER — BISACODYL 5 MG PO TBEC
10.0000 mg | DELAYED_RELEASE_TABLET | Freq: Every day | ORAL | Status: DC
  Administered 2014-08-03 – 2014-08-10 (×6): 10 mg via ORAL
  Filled 2014-08-03 (×8): qty 2

## 2014-08-03 NOTE — Progress Notes (Signed)
OT Cancellation Note  Patient Details Name: [name] MRN: [id number] DOB: [birth year]   Cancelled Treatment:    Reason Eval/Treat Not Completed:  Pt to transfer to inpatient rehab later today.  Will defer OT eval to CIR.  Evern Bio 08/03/2014, 1:49 PM  [phone]

## 2014-08-03 NOTE — H&P (Signed)
Physical Medicine and Rehabilitation Admission H&P   Chief Complaint  Patient presents with  . Weakness  . Diarrhea  . Altered Mental Status  : HPI: [name] is a 69 y.o. right handed female with history of hypertension, remote history of endocarditis with destruction of mitral valve requiring St. Jude mechanical valve placement by Dr. Roxy Manns in April 2000 maintained on chronic Coumadin. Independent prior to admission living alone. Presented 07/21/2014 with altered mental status and malaise and witnessed PEA arrest in the ED with bradycardia down to the 20s. Patient was severely diaphoretic on arrival. Noted creatinine 3.57. Troponin 0.95. EKG showed right bundle branch block and left anterior fascicular block consistent with previous EKG from 03/11/2014.Marland Kitchen Renal ultrasound with no hydronephrosis. Cardiology as well as critical care medicine follow-up for septic shock and cardiac arrest. Patient required full intubation. TEE completed showing ejection fraction of 55% motions of the leaflets of the mechanical valve in the mitral position appeared restricted. Underwent redo mitral valve replacement 07/25/2014 per Dr. Roxy Manns. Hospital course pain management. Renal function slowly improving creatinine 2.08 with follow-up per renal services felt that renal insufficiency related to acute decompensated CHF related to cardiogenic shock. Chronic Coumadin ongoing. Acute blood loss anemia 8.5 and monitored. Patient was extubated 07/26/2014. Tolerating a regular diet. Physical therapy evaluation completed and ongoing noting debilitation as well as some difficulty with recall of events as well as memory deficits with recommendations of physical medicine rehabilitation consult. Admit for comprehensive rehabilitation program  ROS Review of Systems  Constitutional: Positive for malaise/fatigue. Negative for fever and chills.  HENT: Negative for hearing loss.  Respiratory: Positive for  shortness of breath. Negative for cough and wheezing.  Cardiovascular: Positive for chest pain and palpitations.  Gastrointestinal: Positive for heartburn and constipation. Negative for abdominal pain.  Genitourinary: Negative for dysuria and urgency.  Musculoskeletal: Positive for myalgias. Negative for falls.  Skin: Negative for rash.  Neurological: Positive for dizziness and weakness. Negative for seizures and headaches   Past Medical History  Diagnosis Date  . Hypertension   . Hyperlipidemia   . S/P MVR (mitral valve replacement) 04/11/1998    #29 St. Jude bileaflet mechanical valve for bacterial endocarditis  . 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 S/P redo mitral valve replacement with bioprosthetic valve 07/25/2014    27 mm [facility] Mitral bovine bioprosthetic tissue valve   Past Surgical History  Procedure Laterality Date  . Mitral valve replacement  04/11/1998    St. Jude mechanical MVR (severe MR, episode of streptococcal bacterial endocarditis - Dr. Roxy Manns  . Tubal ligation    . Transthoracic echocardiogram  11/2009    EF=>55%; bi-leaflet St. Jude mech prosthesis; mild TR, RVSP elevated at 40-51mHg, mod pulm HTN; trace AV regurg; mild pulm valve regurg  . Total abdominal hysterectomy w/ bilateral salpingoophorectomy  12/14/1998  . Cardiac catheterization N/A 07/24/2014    Procedure: Right/Left Heart Cath and Coronary Angiography; Surgeon: MWellington Hampshire MD; Location: [facility]; Service: Cardiovascular; Laterality: N/A;  . Cardiac catheterization N/A 07/22/2014    Procedure: Fluoroscopy Guidance; Surgeon: TTroy Sine MD; Location: [facility]; Service:  Cardiovascular; Laterality: N/A;  . Mitral valve replacement N/A 07/25/2014    Procedure: REDO MITRAL VALVE REPLACEMENT (MVR); Surgeon: CRexene Alberts MD; Location:  [facility]; Service: Open Heart Surgery; Laterality: N/A;   Family History  Problem Relation Age of Onset  . Lung cancer Mother   . Heart failure Maternal Grandmother   . Heart failure Maternal Grandfather    Social History:  reports that she quit smoking about 41 years ago. She has never used smokeless tobacco. She reports that she does not drink alcohol or use illicit drugs. Allergies:  Allergies  Allergen Reactions  . Augmentin [Amoxicillin-Pot Clavulanate]    Medications Prior to Admission  Medication Sig Dispense Refill  . carvedilol (COREG) 6.25 MG tablet Take 1 tablet (6.25 mg total) by mouth 2 (two) times daily with a meal. 60 tablet 11  . lisinopril (PRINIVIL,ZESTRIL) 20 MG tablet Take 1 tablet (20 mg total) by mouth daily. 30 tablet 11  . simvastatin (ZOCOR) 40 MG tablet Take 1 tablet (40 mg total) by mouth daily. 30 tablet 11  . triamterene-hydrochlorothiazide (DYAZIDE) 37.5-25 MG per capsule Take 1 each (1 capsule total) by mouth daily. 30 capsule 11  . warfarin (COUMADIN) 3 MG tablet TAKE ONE TO ONE & ONE-HALF TABLETS BY MOUTH ONCE DAILY AS DIRECTED 120 tablet 0    Home: Home Living Family/patient expects to be discharged to:: Private residence Living Arrangements: Alone Available Help at Discharge: Family, Available 24 hours/day Type of Home: Other(Comment) (townhouse) Home Access: Level entry Home Layout: One level Bathroom Shower/Tub: Tub/shower unit, Architectural technologist: Standard Home Equipment: None Lives With: Alone  Functional History: Prior Function Level of Independence: Independent Comments: retired from [employer]:  Mobility: Bed Mobility Overal bed mobility: Needs Assistance, +2 for physical assistance, +  2 for safety/equipment Bed Mobility: Rolling, Sit to Sidelying Rolling: Min assist Sit to sidelying: Mod assist, +2 for safety/equipment General bed mobility comments: back to bed Transfers Overall transfer level: Needs assistance Equipment used: Rolling walker (2 wheeled) Transfers: Sit to/from Stand Sit to Stand: Min assist, +2 safety/equipment General transfer comment: x3 from recliner;   Ambulation/Gait Ambulation/Gait assistance: Mod assist Ambulation Distance (Feet): 60 Feet Assistive device: Rolling walker (2 wheeled) (w/c) Gait Pattern/deviations: Step-through pattern, Decreased stride length, Drifts right/left General Gait Details: consistently drifting to her right and initially required assist to control/maneuver RW to avoid objects on her right; progressed to her able to adjust RW once PT pointed out to her the obstacle that she was running into; weak hips and torso make gait unsteady Gait velocity interpretation: Below normal speed for age/gender    ADL:    Cognition: Cognition Overall Cognitive Status: Impaired/Different from baseline Arousal/Alertness: Awake/alert Orientation Level: Oriented to person, Oriented to place, Oriented to situation, Disoriented to time Attention: Sustained Sustained Attention: Impaired Sustained Attention Impairment: Functional basic, Verbal basic Memory: Impaired Memory Impairment: Storage deficit, Retrieval deficit, Decreased recall of new information Awareness: Impaired Awareness Impairment: Intellectual impairment, Emergent impairment, Anticipatory impairment Problem Solving: Impaired Problem Solving Impairment: Functional basic Safety/Judgment: Impaired Cognition Arousal/Alertness: Awake/alert Behavior During Therapy: WFL for tasks assessed/performed Overall Cognitive Status: Impaired/Different from baseline Area of Impairment: Orientation, Attention, Memory, Following commands, Safety/judgement, Awareness, Problem  solving Orientation Level: Time Current Attention Level: Sustained Memory: Decreased recall of precautions, Decreased short-term memory Following Commands: Follows one step commands with increased time Safety/Judgement: Decreased awareness of safety, Decreased awareness of deficits Awareness: Intellectual Problem Solving: Slow processing, Difficulty sequencing, Requires verbal cues, Requires tactile cues General Comments: able to openly discuss her decr memory with daughter present and pt acknowledged these deficits; unable to recall grandaughter's name (in room) or the  year of her birthdate  Physical Exam: Blood pressure 115/63, pulse 90, temperature 98.9 F (37.2 C), temperature source Oral, resp. rate 18, height 5' 6" (1.676 m), weight 73.5 kg (162 lb 0.6 oz), SpO2 98 %. Physical Exam Constitutional: She appears well-developed.  HENT: oral mucosa pink and moist Head: Normocephalic.  Eyes: EOM are normal.  Neck: Normal range of motion. Neck supple. No thyromegaly present.  Cardiovascular:  Cardiac rate controlled. No murmurs.  Respiratory: Effort normal and breath sounds normal. No respiratory distress.  GI: Soft. Bowel sounds are normal. She exhibits no distension.  Neurological: She is alert.  Mood is flat but appropriate. She makes good eye contact with examiner. She was able to provide her age but needed some cues for dates. Fair insight and awareness. Occasionally needs cues for higher level tasks and processes Follows simple commands. UE 4/5 prox to distal with inhibition due to pain/sternum. LE: 2+ hf, 3ke, 4/5 adf/afp. Decreased sensation in distal feet/toes.  Skin: Skin is warm and dry. Wound incision healing nicely along chest Psychiatric: She has a normal mood and affect    Lab Results Last 48 Hours    Results for orders placed or performed during the hospital encounter of 07/21/14 (from the past 48 hour(s))  Protime-INR Status: Abnormal   Collection  Time: 08/01/14 3:59 AM  Result Value Ref Range   Prothrombin Time 23.1 (H) 11.6 - 15.2 seconds   INR 2.07 (H) 0.00 - 1.49  Urinalysis, Routine w reflex microscopic (not at [facility]) Status: Abnormal   Collection Time: 08/01/14 9:18 AM  Result Value Ref Range   Color, Urine YELLOW YELLOW   APPearance CLOUDY (A) CLEAR   Specific Gravity, Urine 1.009 1.005 - 1.030   pH 8.0 5.0 - 8.0   Glucose, UA NEGATIVE NEGATIVE mg/dL   Hgb urine dipstick LARGE (A) NEGATIVE   Bilirubin Urine NEGATIVE NEGATIVE   Ketones, ur NEGATIVE NEGATIVE mg/dL   Protein, ur 30 (A) NEGATIVE mg/dL   Urobilinogen, UA 0.2 0.0 - 1.0 mg/dL   Nitrite NEGATIVE NEGATIVE   Leukocytes, UA SMALL (A) NEGATIVE  Culture, Urine Status: None   Collection Time: 08/01/14 9:18 AM  Result Value Ref Range   Specimen Description URINE, RANDOM    Special Requests Normal    Culture NO GROWTH 1 DAY    Report Status 08/02/2014 FINAL   Urine microscopic-add on Status: None   Collection Time: 08/01/14 9:18 AM  Result Value Ref Range   Squamous Epithelial / LPF RARE RARE   WBC, UA 7-10 <3 WBC/hpf   RBC / HPF 21-50 <3 RBC/hpf   Bacteria, UA RARE RARE  Protime-INR Status: Abnormal   Collection Time: 08/02/14 3:35 AM  Result Value Ref Range   Prothrombin Time 18.9 (H) 11.6 - 15.2 seconds   INR 1.58 (H) 0.00 - 2.75  Basic metabolic panel Status: Abnormal   Collection Time: 08/02/14 3:35 AM  Result Value Ref Range   Sodium 136 135 - 145 mmol/L   Potassium 4.1 3.5 - 5.1 mmol/L   Chloride 100 (L) 101 - 111 mmol/L   CO2 28 22 - 32 mmol/L   Glucose, Bld 110 (H) 65 - 99 mg/dL   BUN 17 6 - 20 mg/dL   Creatinine, Ser 2.06 (H) 0.44 - 1.00 mg/dL   Calcium 9.5 8.9 - 10.3 mg/dL   GFR calc non Af Amer 24 (L) >60 mL/min   GFR calc Af Amer 28 (L) >60 mL/min     Comment: (NOTE) The  eGFR has been calculated using the CKD EPI equation. This calculation has not been validated in all clinical situations. eGFR's persistently <60 mL/min signify possible Chronic Kidney Disease.    Anion gap 8 5 - 15  CBC Status: Abnormal   Collection Time: 08/02/14 3:35 AM  Result Value Ref Range   WBC 11.9 (H) 4.0 - 10.5 K/uL   RBC 2.90 (L) 3.87 - 5.11 MIL/uL   Hemoglobin 8.5 (L) 12.0 - 15.0 g/dL   HCT 26.1 (L) 36.0 - 46.0 %   MCV 90.0 78.0 - 100.0 fL   MCH 29.3 26.0 - 34.0 pg   MCHC 32.6 30.0 - 36.0 g/dL   RDW 15.0 11.5 - 15.5 %   Platelets 152 150 - 400 K/uL      Imaging Results (Last 48 hours)    Dg Chest 2 View  08/02/2014 CLINICAL DATA: Follow-up atelectasis, status post mitral valve replacement on July 25, 2014. EXAM: CHEST 2 VIEW COMPARISON: PA and lateral chest x-ray of July 30, 2014. FINDINGS: The lungs are reasonably well inflated. There is no pneumothorax. There are stable bilateral pleural effusions. The pulmonary interstitial markings remain increased diffusely. There is stable mild bibasilar subsegmental atelectasis. The cardiac silhouette is enlarged. The central pulmonary vascularity is less prominent today. There are 8 intact sternal wires. The prosthetic mitral valve ring is visible. The bony thorax exhibits no acute abnormality. IMPRESSION: Persistent bilateral pleural effusions, bibasilar atelectasis, and mild pulmonary interstitial edema. The pulmonary vascular engorgement has decreased slightly. Electronically Signed By: David Martinique M.D. On: 08/02/2014 08:19        Medical Problem List and Plan: 1. Functional deficits secondary to debilitation after cardiac arrest/sepsis, subsequent mitral valve redo  2. DVT Prophylaxis/Anticoagulation: Chronic Coumadin therapy. Monitor for any bleeding episodes 3. Pain Management: Ultram as needed. Monitor with increased  mobility 4. Acute blood loss anemia. Follow-up CBC upon admit 5. Neuropsych: This patient is capable of making decisions on her own behalf. 6. Skin/Wound Care: Routine skin checks 7. Fluids/Electrolytes/Nutrition: Routine I&O follow-up chemistries 8. Hypertension/atrial fibrillation. Amiodarone 200 mg twice a day, Coreg 6.25 mg twice a day, . Monitor with increased mobility 9. Diastolic congestive heart failure. Continue Lasix 40 mg twice daily. Monitor for any signs of fluid overload -check daily weights, i's and o's. Appears fluid balanced at present 9. Acute on chronic renal insufficiency. Baseline creatinine 1.48 L creatinine 2.06. Renal ultrasound negative. Follow-up chemistries. Follow-up renal services 10. Hyperlipidemia. Lipitor   Post Admission Physician Evaluation: 1. Functional deficits secondary to cardiac arrest/sepsis, mild anoxic encephalopathy. 2. Patient is admitted to receive collaborative, interdisciplinary care between the physiatrist, rehab nursing staff, and therapy team. 3. Patient's level of medical complexity and substantial therapy needs in context of that medical necessity cannot be provided at a lesser intensity of care such as a SNF. 4. Patient has experienced substantial functional loss from his/her baseline which was documented above under the "Functional History" and "Functional Status" headings. Judging by the patient's diagnosis, physical exam, and functional history, the patient has potential for functional progress which will result in measurable gains while on inpatient rehab. These gains will be of substantial and practical use upon discharge in facilitating mobility and self-care at the household level. 5. Physiatrist will provide 24 hour management of medical needs as well as oversight of the therapy plan/treatment and provide guidance as appropriate regarding the interaction of the two. 6. 24 hour rehab nursing will assist with bladder management, bowel  management, safety, skin/wound care, disease management,  medication administration, pain management and patient education and help integrate therapy concepts, techniques,education, etc. 7. PT will assess and treat for/with: Lower extremity strength, range of motion, stamina, balance, functional mobility, safety, adaptive techniques and equipment, NMR, cognitive perceptual awareness, pain mgt, family education ego support. Goals are: mod I. 8. OT will assess and treat for/with: ADL's, functional mobility, safety, upper extremity strength, adaptive techniques and equipment, NMR, chest precautions, cognitive perceptual awareness, family education, patient education. Goals are: mod I to supervision. Therapy may proceed with showering this patient. 9. SLP will assess and treat for/with: cognition, commuincation. Goals are: mod I. 10. Case Management and Social Worker will assess and treat for psychological issues and discharge planning. 11. Team conference will be held weekly to assess progress toward goals and to determine barriers to discharge. 12. Patient will receive at least 3 hours of therapy per day at least 5 days per week. 13. ELOS: 7-8 days  14. Prognosis: excellent     [name], MD, [facility] 08/03/2014

## 2014-08-03 NOTE — Progress Notes (Signed)
CARDIAC REHAB PHASE I   Pt getting ready to transfer to CIR this morning. [facility] discharge education completed with pt niece at bedside. Reviewed IS, sternal precautions, activity progression, exercise, risk factors, diet including heart healthy, sodium restrictions and phase 2 cardiac rehab. Pt verbalized understanding. Pt agrees to phase 2 cardiac rehab. Will send referral to [facility]. Pt declined cardiac surgery discharge video, states she has no other questions at this time, in bed, call light within reach, family at bedside.   [phone]  Joylene Grapes, RN, BSN 08/03/2014 10:57 AM

## 2014-08-03 NOTE — Telephone Encounter (Signed)
Closed encounter °

## 2014-08-03 NOTE — Progress Notes (Signed)
Received pt. As transfer from [facility].Pt. And family were oriented to the unit and routine.Pt. Has surgical incisions on her chest OTA.Also an open excoriation was noticed on her left lower arm,with foam on.Keep monitoring pt. Closely and assessing her needs.

## 2014-08-03 NOTE — Progress Notes (Signed)
Rehab admissions - I have spoken with pt, her dtr (by phone twice) and pt's niece. They are all in agreement to pursue inpatient rehab and dtr stated she will be able to provide needed support for her mom when she comes home from rehab. I have medical clearance from [facility], cardio PA and we have an available rehab bed. We will admit pt to inpatient rehab later today.  I updated Lelon Mast, case Production designer, theatre/television/film and Eileen Stanford, Child psychotherapist. Please call me with any questions. Thanks.  Juliann Mule, PT Rehabilitation Admissions Coordinator [phone]

## 2014-08-03 NOTE — Care Management Important Message (Signed)
Important Message  Patient Details  Name: Darrelyn Hillock MRN: [id number] Date of Birth: [birth year]   Medicare Important Message Given:  Yes-fourth notification given    Kyla Balzarine 08/03/2014, 12:01 PMImportant Message  Patient Details  Name: Darrelyn Hillock MRN: [id number] Date of Birth: 1945/02/08   Medicare Important Message Given:  Yes-fourth notification given    Kyla Balzarine 08/03/2014, 12:01 PM

## 2014-08-03 NOTE — Progress Notes (Addendum)
[address]       [place] [address]             [phone]      9 Days Post-Op Procedure(s) (LRB): REDO MITRAL VALVE REPLACEMENT (MVR) (N/A) Subjective: conts to feel stronger and more alert, frequent urination but improving- now on cipro for possible UTI  Objective: Vital signs in last 24 hours: Temp:  [97 F (36.1 C)-99.1 F (37.3 C)] 97 F (36.1 C) (07/27 0519) Pulse Rate:  [68-99] 89 (07/27 0519) Cardiac Rhythm:  [-] Normal sinus rhythm (07/26 2000) Resp:  [16-19] 18 (07/27 0519) BP: (96-128)/(63-83) 128/83 mmHg (07/27 0519) SpO2:  [98 %-100 %] 99 % (07/27 0519) Weight:  [158 lb 1.6 oz (71.714 kg)] 158 lb 1.6 oz (71.714 kg) (07/27 0519)  Hemodynamic parameters for last 24 hours:    Intake/Output from previous day: 07/26 0701 - 07/27 0700 In: -  Out: 1775 [Urine:1775] Intake/Output this shift: Total I/O In: -  Out: 25 [Urine:25]  General appearance: alert, cooperative and no distress Heart: regular rate and rhythm Lungs: dim in bases Abdomen: soft, non-tender Extremities: + LE edema Wound: incis healing well  Lab Results:  Recent Labs  08/02/14 0335  WBC 11.9*  HGB 8.5*  HCT 26.1*  PLT 152   BMET:  Recent Labs  08/02/14 0335 08/03/14 0413  NA 136 136  K 4.1 4.0  CL 100* 102  CO2 28 27  GLUCOSE 110* 110*  BUN 17 16  CREATININE 2.06* 2.08*  CALCIUM 9.5 10.1    PT/INR:  Recent Labs  08/03/14 0413  LABPROT 19.3*  INR 1.63*   ABG    Component Value Date/Time   PHART 7.396 07/26/2014 0946   HCO3 24.2* 07/26/2014 0946   TCO2 23 07/26/2014 1607   ACIDBASEDEF 1.0 07/26/2014 0946   O2SAT 99.0 07/26/2014 0946   CBG (last 3)  No results for input(s): GLUCAP in the last 72 hours.  Meds Scheduled Meds: . sodium chloride   Intravenous Once  . sodium chloride   Intravenous Once  . amiodarone  200 mg Oral BID PC  . antiseptic oral rinse  7 mL Mouth Rinse BID  . aspirin EC  81 mg Oral Daily  . atorvastatin  20  mg Oral q1800  . bisacodyl  10 mg Oral Daily   Or  . bisacodyl  10 mg Rectal Daily  . carvedilol  6.25 mg Oral BID WC  . ciprofloxacin  500 mg Oral BID  . docusate sodium  200 mg Oral Daily  . furosemide  40 mg Oral BID  . pantoprazole  40 mg Oral Daily  . potassium chloride  20 mEq Oral BID  . sodium chloride  3 mL Intravenous Q12H  . sodium chloride  3 mL Intravenous Q12H  . warfarin  1 mg Oral q1800  . Warfarin - Physician Dosing Inpatient   Does not apply q1800   Continuous Infusions: . sodium chloride Stopped (07/27/14 1700)   PRN Meds:.sodium chloride, acetaminophen, ondansetron (ZOFRAN) IV, sodium chloride, sodium chloride, traMADol  Xrays Dg Chest 2 View  08/02/2014   CLINICAL DATA:  Follow-up atelectasis, status post mitral valve replacement on July 25, 2014.  EXAM: CHEST  2 VIEW  COMPARISON:  PA and lateral chest x-ray of July 30, 2014.  FINDINGS: The lungs are reasonably well inflated. There is no pneumothorax. There are stable bilateral pleural effusions. The pulmonary interstitial markings remain increased diffusely. There is stable mild  bibasilar subsegmental atelectasis. The cardiac silhouette is enlarged. The central pulmonary vascularity is less prominent today. There are 8 intact sternal wires. The prosthetic mitral valve ring is visible. The bony thorax exhibits no acute abnormality.  IMPRESSION: Persistent bilateral pleural effusions, bibasilar atelectasis, and mild pulmonary interstitial edema. The pulmonary vascular engorgement has decreased slightly.   Electronically Signed   By: David  [place] M.D.   On: 08/02/2014 08:19    Assessment/Plan: S/P Procedure(s) (LRB): REDO MITRAL VALVE REPLACEMENT (MVR) (N/A)  1 steady improvement, some intermit. afib with controlled rate 2 INR is increasing - cont coumadin, watch closely on cipro 3 UTI- probable- cx pending 4 recheck renal fxn in am- cont diuresis 5 poss CIR soon   LOS: 13 days    GOLD,WAYNE E 08/03/2014  I  have seen and examined the patient and agree with the assessment and plan as outlined.  Ready for d/c to Inpatient Rehab service.  Purcell Nails 08/03/2014 8:48 AM

## 2014-08-03 NOTE — PMR Pre-admission (Signed)
PMR Admission Coordinator Pre-Admission Assessment  Patient: [name] is an 69 y.o., female MRN: [id number] DOB: Jul 03, 1945 Height: 5\' 6"  (167.6 cm) Weight: 71.714 kg (158 lb 1.6 oz)              Insurance Information  PRIMARY: Medicare A & B      Policy#: [id number]      Subscriber: self Pre-Cert#: verified in [employer]: retired  [employer]. Date: A & B: 08-07-12     Deduct: $1288      Out of Pocket Max: none      Life Max: unlimited CIR: 100%      SNF: 100% days 1-20; 80% days 21-100 (100 days max) Outpatient: 80/20%     Co-Pay: none Home Health: 100%      Co-Pay: none DME: 80/20%     Co-Pay: none Providers: pt's preference  Note: pt's name is listed in Medicare system (and on her Medicare card) as "[name]"  SECONDARY: AARP Supplement      Policy#: [id number]      Subscriber: self Benefits:  Phone #: [phone]       Emergency Contact Information Contact Information    Name Relation Home Work Mobile   Johnson,[name] Daughter [phone]  [phone]   Goode,Brittany Niece   [phone]     Current Medical History  Patient Admitting Diagnosis: Debilitated after cardiac arrest status post redo mitral valve replacement  History of Present Illness: [name] is a 69 y.o. right handed female with history of hypertension, remote history of endocarditis with destruction of mitral valve requiring St. Jude mechanical valve placement by Dr. Cornelius Moras in April 2000 maintained on chronic Coumadin. Independent prior to admission living alone. Presented 07/21/2014 with altered mental status and  malaise and witnessed PEA arrest in the ED with bradycardia down to the 20s. Patient was severely diaphoretic on arrival. Noted creatinine 3.57. Troponin 0.95. EKG showed right bundle branch block and left anterior fascicular block consistent with previous EKG from 03/11/2014.Marland Kitchen Renal ultrasound with no hydronephrosis. Cardiology as well as critical care medicine  follow-up for septic shock and cardiac arrest. Patient required full intubation. TEE completed showing ejection fraction of 55% motions of the leaflets of the mechanical valve in the mitral position appeared restricted. Underwent redo mitral valve replacement 07/25/2014 per Dr. Cornelius Moras. Hospital course pain management. Renal function slowly improving creatinine 2.06. Chronic Coumadin ongoing. Patient was extubated 07/26/2014. Tolerating a regular diet. Physical therapy evaluation completed and ongoing noting debilitation as well as some difficulty with recall of events as well as memory deficits with recommendations of physical medicine rehabilitation consult.   Past Medical History  Past Medical History  Diagnosis Date  . Hypertension   . Hyperlipidemia   . S/P MVR (mitral valve replacement) 04/11/1998    #29 St. Jude bileaflet mechanical valve for bacterial endocarditis  . 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 S/P redo mitral valve replacement with bioprosthetic valve 07/25/2014    27 mm [facility] Mitral bovine bioprosthetic tissue valve    Family History  family history includes Heart failure in her maternal grandfather and maternal grandmother; Lung cancer in her mother.  Prior Rehab/Hospitalizations:  Has the patient had major surgery during 100 days prior to admission? No   Pt  had previous cardiac rehab after MVR in 2000.   Current Medications   Current facility-administered medications:  .  0.9 %  sodium chloride infusion, 250 mL, Intravenous, Continuous, Purcell Nails, MD, Stopped at 07/27/14 1700 .  0.9 %  sodium chloride infusion, , Intravenous, Once, Purcell Nails, MD .  0.9 %  sodium chloride infusion, , Intravenous, Once, Purcell Nails, MD .  0.9 %  sodium  chloride infusion, 250 mL, Intravenous, PRN, Purcell Nails, MD .  acetaminophen (TYLENOL) tablet 650 mg, 650 mg, Oral, Q4H PRN, Purcell Nails, MD .  amiodarone (PACERONE) tablet 200 mg, 200 mg, Oral, BID PC, Purcell Nails, MD, 200 mg at 08/03/14 1015 .  antiseptic oral rinse (CPC / CETYLPYRIDINIUM CHLORIDE 0.05%) solution 7 mL, 7 mL, Mouth Rinse, BID, Purcell Nails, MD, 7 mL at 08/02/14 2204 .  aspirin EC tablet 81 mg, 81 mg, Oral, Daily, 81 mg at 08/03/14 1015 **OR** [DISCONTINUED] aspirin chewable tablet 324 mg, 324 mg, Per Tube, Daily, Purcell Nails, MD .  atorvastatin (LIPITOR) tablet 20 mg, 20 mg, Oral, q1800, Marquita Palms, RPH, 20 mg at 08/02/14 1727 .  bisacodyl (DULCOLAX) EC tablet 10 mg, 10 mg, Oral, Daily, 10 mg at 08/03/14 1016 **OR** bisacodyl (DULCOLAX) suppository 10 mg, 10 mg, Rectal, Daily, Purcell Nails, MD .  carvedilol (COREG) tablet 6.25 mg, 6.25 mg, Oral, BID WC, Purcell Nails, MD, 6.25 mg at 08/03/14 1016 .  ciprofloxacin (CIPRO) tablet 500 mg, 500 mg, Oral, BID, Purcell Nails, MD, 500 mg at 08/03/14 1016 .  docusate sodium (COLACE) capsule 200 mg, 200 mg, Oral, Daily, Purcell Nails, MD, 200 mg at 08/03/14 1015 .  furosemide (LASIX) tablet 40 mg, 40 mg, Oral, BID, Purcell Nails, MD, 40 mg at 08/03/14 1016 .  ondansetron (ZOFRAN) injection 4 mg, 4 mg, Intravenous, Q6H PRN, Purcell Nails, MD .  pantoprazole (PROTONIX) EC tablet 40 mg, 40 mg, Oral, Daily, Purcell Nails, MD, 40 mg at 08/03/14 1017 .  potassium chloride SA (K-DUR,KLOR-CON) CR tablet 20 mEq, 20 mEq, Oral, BID, Purcell Nails, MD, 20 mEq at 08/03/14 1017 .  sodium chloride 0.9 % injection 3 mL, 3 mL, Intravenous, Q12H, Purcell Nails, MD, 3 mL at 08/03/14 1018 .  sodium chloride 0.9 % injection 3 mL, 3 mL, Intravenous, PRN, Purcell Nails, MD .  sodium chloride 0.9 % injection 3 mL, 3 mL, Intravenous, Q12H, Purcell Nails, MD, 3 mL at 08/03/14 1017 .  sodium chloride 0.9 % injection 3 mL, 3  mL, Intravenous, PRN, Purcell Nails, MD .  traMADol Janean Sark) tablet 50 mg, 50 mg, Oral, Q12H PRN, Purcell Nails, MD, 50 mg at 08/01/14 2300 .  warfarin (COUMADIN) tablet 1 mg, 1 mg, Oral, q1800, Purcell Nails, MD, 1 mg at 08/02/14 1726 .  Warfarin - Physician Dosing Inpatient, , Does not apply, q1800, Purcell Nails, MD  Patients Current Diet: Diet Heart Room service appropriate?: Yes; Fluid consistency:: Thin  Precautions / Restrictions Precautions Precautions: Sternal, Fall Precaution Comments:  Restrictions Weight Bearing Restrictions: No   Has the patient had 2 or more falls or a fall with injury in the past year?No  Prior Activity Level Community (5-7x/wk): Pt got out everyday and babysat her one year old granddtr everyday while her dtr works.  Home Assistive Devices / Equipment Home Assistive Devices/Equipment: None Home Equipment: None  Prior Device Use: Indicate devices/aids used  by the patient prior to current illness, exacerbation or injury? None of the above  Prior Functional Level Prior Function Level of Independence: Independent Comments: retired from [employer]  Self Care: Did the patient need help bathing, dressing, using the toilet or eating?  Independent  Indoor Mobility: Did the patient need assistance with walking from room to room (with or without device)? Independent  Stairs: Did the patient need assistance with internal or external stairs (with or without device)? Independent  Functional Cognition: Did the patient need help planning regular tasks such as shopping or remembering to take medications? Independent  Current Functional Level Cognition  Arousal/Alertness: Awake/alert Overall Cognitive Status: Impaired/Different from baseline Current Attention Level: Sustained Orientation Level: Oriented to person, Oriented to place, Disoriented to time Following Commands: Follows one step commands with increased time Safety/Judgement: Decreased awareness of  safety, Decreased awareness of deficits General Comments: sitting EOB alone and about to get to [facility] alone; easily frustrated (? due to earlier SLP cognitive eval) Attention: Sustained Sustained Attention: Impaired Sustained Attention Impairment: Functional basic, Verbal basic Memory: Impaired Memory Impairment: Storage deficit, Retrieval deficit, Decreased recall of new information Awareness: Impaired Awareness Impairment: Intellectual impairment, Emergent impairment, Anticipatory impairment Problem Solving: Impaired Problem Solving Impairment: Functional basic Safety/Judgment: Impaired    Extremity Assessment (includes Sensation/Coordination)  Upper Extremity Assessment: Overall WFL for tasks assessed  Lower Extremity Assessment: Generalized weakness    ADLs   not assessed, anticipate needs    Mobility  Overal bed mobility: Needs Assistance Bed Mobility: Sit to Sidelying Rolling: Min guard Sit to sidelying: Min guard General bed mobility comments: max cues for technique to maintain sternal precautions (pt unable to recall any part of process) difficulty raising legs with nearly needing assist    Transfers  Overall transfer level: Needs assistance Equipment used: None Transfers: Sit to/from Stand, Stand Pivot Transfers Sit to Stand: Min guard Stand pivot transfers: Min guard General transfer comment: pt insisted on BSC in front of her (required 180* turn) vs beside her (90* turn); transfers x 3    Ambulation / Gait / Stairs / Wheelchair Mobility  Ambulation/Gait Ambulation/Gait assistance: Architect (Feet): 30 Feet Assistive device: None Gait Pattern/deviations: Step-through pattern, Decreased stride length, Wide base of support General Gait Details: without RW also drifts slightly to her right, especially as turning right; incr lateral sway Gait velocity: decr Gait velocity interpretation: Below normal speed for age/gender    Posture / Balance  Static Standing Balance Rhomberg - Eyes Opened:  (30 seconds with incr sway and min guard) Rhomberg - Eyes Closed:  (10 sec with incr sway and then step posterior; min assist) Balance Overall balance assessment: Needs assistance Sitting-balance support: No upper extremity supported, Feet supported Sitting balance-Leahy Scale: Fair Standing balance support: No upper extremity supported Standing balance-Leahy Scale: Poor Rhomberg - Eyes Opened:  (30 seconds with incr sway and min guard) Rhomberg - Eyes Closed:  (10 sec with incr sway and then step posterior; min assist) High Level Balance Comments: pt refused balance or standing exercises; frustrated/fatigued    Special needs/care consideration BiPAP/CPAP no CPM no  Continuous Drip IV no  Dialysis no         Life Vest no  Oxygen no  Special Bed no  Trach Size no  Wound Vac (area) no       Skin - sternal incision  Bowel mgmt: last BM on 08-02-14 Bladder mgmt: using BSC with assist, urinary urgency and frequency Diabetic mgmt no   Previous Home Environment Living Arrangements: Alone  Lives With: Alone Available Help at Discharge: Family, Available 24 hours/day Type of Home: Other(Comment) (townhouse) Home Layout: One level Home Access: Level entry Bathroom Shower/Tub: Tub/shower unit, Engineer, building services: Standard Home Care Services: No  Discharge Living Setting Plans for Discharge Living Setting: Other (Comment) (plan is to go to Dtr's home) Type of Home at Discharge: House Discharge Home Layout: One level Discharge Home Access: Level entry Does the patient have any problems obtaining your medications?: No  Social/Family/Support Systems Patient Roles: Other (Comment) (babysits her one year old granddtr) Contact Information: dtr [name] is primary contact Anticipated Caregiver: [name] and supportive family Anticipated Caregiver's Contact Information: see above Ability/Limitations of  Caregiver: no limitations as dtr plans to take time off work Garment/textile technologist is a Armed forces operational officer) and plans to have pt stay with her until pt is able to return home independently to her apartment. Caregiver Availability: 24/7 Discharge Plan Discussed with Primary Caregiver: Yes Is Caregiver In Agreement with Plan?: Yes Does Caregiver/Family have Issues with Lodging/Transportation while Pt is in Rehab?: No  Goals/Additional Needs Patient/Family Goal for Rehab: Mod Ind with PT/OT/SLP Expected length of stay: 7 days Cultural Considerations: none Dietary Needs: heart healthy Equipment Needs: to be determined Pt/Family Agrees to Admission and willing to participate: Yes (spoke with pt and dtr (by phone)) Program Orientation Provided & Reviewed with Pt/Caregiver Including Roles  & Responsibilities: Yes   Decrease burden of Care through IP rehab admission: NA   Possible need for SNF placement upon discharge: not anticipated   Patient Condition: This patient's condition remains as documented in the consult dated 08-02-14, in which the Rehabilitation Physician determined and documented that the patient's condition is appropriate for intensive rehabilitative care in an inpatient rehabilitation facility. Will admit to inpatient rehab today.  Preadmission Screen Completed By:  Juliann Mule, PT, 08/03/2014 11:01 AM ______________________________________________________________________   Discussed status with Dr. Riley Kill on 08-03-14 at 1051 and received telephone approval for admission today.  Admission Coordinator:  Juliann Mule, PT, time1051/Date 08-03-14

## 2014-08-03 NOTE — Care Management Note (Signed)
Case Management Note  Patient Details  Name: [name] MRN: [id number] Date of Birth: 12-27-1945  Subjective/Objective:        Pt s/p redo mitral valve replacement with bioprosthetic valve            Action/Plan:  Per pt and daughter [name], pt was independent from home prior to admit.  CIR has been recommended for pt, and are evaluating for appropriateness.  CM will continue to monitor   Expected Discharge Date:                  Expected Discharge Plan:  IP Rehab Facility  In-House Referral:     Discharge planning Services  CM Consult  Post Acute Care Choice:    Choice offered to:     DME Arranged:    DME Agency:     HH Arranged:    HH Agency:     Status of Service:  Complete, will sign off Medicare Important Message Given:  Yes-third notification given Date Medicare IM Given:    Medicare IM give by:    Date Additional Medicare IM Given:    Additional Medicare Important Message give by:     If discussed at Long Length of Stay Meetings, dates discussed:  08/02/14  Additional Comments: CIR confirmed pt will be discharged to them today 08/03/14. Cherylann Parr, RN 08/03/2014, 10:09 AM

## 2014-08-04 ENCOUNTER — Inpatient Hospital Stay (HOSPITAL_COMMUNITY): Payer: Medicare Other | Admitting: Physical Therapy

## 2014-08-04 ENCOUNTER — Inpatient Hospital Stay (HOSPITAL_COMMUNITY): Payer: Medicare Other

## 2014-08-04 ENCOUNTER — Inpatient Hospital Stay (HOSPITAL_COMMUNITY): Payer: Medicare Other | Admitting: Speech Pathology

## 2014-08-04 DIAGNOSIS — I5030 Unspecified diastolic (congestive) heart failure: Secondary | ICD-10-CM

## 2014-08-04 DIAGNOSIS — D62 Acute posthemorrhagic anemia: Secondary | ICD-10-CM

## 2014-08-04 DIAGNOSIS — G931 Anoxic brain damage, not elsewhere classified: Secondary | ICD-10-CM

## 2014-08-04 DIAGNOSIS — R5381 Other malaise: Secondary | ICD-10-CM

## 2014-08-04 DIAGNOSIS — R197 Diarrhea, unspecified: Secondary | ICD-10-CM

## 2014-08-04 LAB — COMPREHENSIVE METABOLIC PANEL
ALT: 41 U/L (ref 14–54)
ANION GAP: 9 (ref 5–15)
AST: 39 U/L (ref 15–41)
Albumin: 2.6 g/dL — ABNORMAL LOW (ref 3.5–5.0)
Alkaline Phosphatase: 73 U/L (ref 38–126)
BUN: 18 mg/dL (ref 6–20)
CO2: 24 mmol/L (ref 22–32)
CREATININE: 2.18 mg/dL — AB (ref 0.44–1.00)
Calcium: 10.1 mg/dL (ref 8.9–10.3)
Chloride: 103 mmol/L (ref 101–111)
GFR calc non Af Amer: 22 mL/min — ABNORMAL LOW (ref >60)
GFR, EST AFRICAN AMERICAN: 26 mL/min — AB (ref >60)
Glucose, Bld: 101 mg/dL — ABNORMAL HIGH (ref 65–99)
Potassium: 4.2 mmol/L (ref 3.5–5.1)
Sodium: 136 mmol/L (ref 135–145)
TOTAL PROTEIN: 6.4 g/dL — AB (ref 6.5–8.1)
Total Bilirubin: 1.6 mg/dL — ABNORMAL HIGH (ref 0.3–1.2)

## 2014-08-04 LAB — CBC WITH DIFFERENTIAL/PLATELET
BASOS ABS: 0 10*3/uL (ref 0.0–0.1)
Basophils Relative: 0 % (ref 0–1)
Eosinophils Absolute: 0.2 10*3/uL (ref 0.0–0.7)
Eosinophils Relative: 2 % (ref 0–5)
HEMATOCRIT: 25.8 % — AB (ref 36.0–46.0)
Hemoglobin: 8.5 g/dL — ABNORMAL LOW (ref 12.0–15.0)
LYMPHS PCT: 9 % — AB (ref 12–46)
Lymphs Abs: 1 10*3/uL (ref 0.7–4.0)
MCH: 29.5 pg (ref 26.0–34.0)
MCHC: 32.9 g/dL (ref 30.0–36.0)
MCV: 89.6 fL (ref 78.0–100.0)
Monocytes Absolute: 1.1 10*3/uL — ABNORMAL HIGH (ref 0.1–1.0)
Monocytes Relative: 10 % (ref 3–12)
NEUTROS ABS: 8.4 10*3/uL — AB (ref 1.7–7.7)
Neutrophils Relative %: 79 % — ABNORMAL HIGH (ref 43–77)
PLATELETS: 191 10*3/uL (ref 150–400)
RBC: 2.88 MIL/uL — AB (ref 3.87–5.11)
RDW: 14.5 % (ref 11.5–15.5)
WBC: 10.7 10*3/uL — ABNORMAL HIGH (ref 4.0–10.5)

## 2014-08-04 LAB — PROTIME-INR
INR: 2.18 — ABNORMAL HIGH (ref 0.00–1.49)
PROTHROMBIN TIME: 24.1 s — AB (ref 11.6–15.2)

## 2014-08-04 MED ORDER — LOPERAMIDE HCL 2 MG PO CAPS
2.0000 mg | ORAL_CAPSULE | ORAL | Status: DC | PRN
  Filled 2014-08-04: qty 1

## 2014-08-04 MED ORDER — WARFARIN - PHARMACIST DOSING INPATIENT
Freq: Every day | Status: DC
  Administered 2014-08-07: 18:00:00

## 2014-08-04 MED ORDER — SACCHAROMYCES BOULARDII 250 MG PO CAPS
250.0000 mg | ORAL_CAPSULE | Freq: Two times a day (BID) | ORAL | Status: DC
  Administered 2014-08-04 – 2014-08-10 (×13): 250 mg via ORAL
  Filled 2014-08-04 (×13): qty 1

## 2014-08-04 MED ORDER — WARFARIN SODIUM 1 MG PO TABS
1.0000 mg | ORAL_TABLET | Freq: Once | ORAL | Status: AC
  Administered 2014-08-04: 1 mg via ORAL
  Filled 2014-08-04 (×2): qty 1

## 2014-08-04 NOTE — Progress Notes (Signed)
Patient information reviewed and entered into eRehab system by Kahdijah Errickson, RN, CRRN, PPS Coordinator.  Information including medical coding and functional independence measure will be reviewed and updated through discharge.     Per nursing patient was given "Data Collection Information Summary for Patients in Inpatient Rehabilitation Facilities with attached "Privacy Act Statement-Health Care Records" upon admission.  

## 2014-08-04 NOTE — Evaluation (Signed)
Speech Language Pathology Assessment and Plan  Patient Details  Name: [name] MRN: [id number] Date of Birth: 1945/08/13  SLP Diagnosis: Cognitive Impairments  Rehab Potential: Excellent ELOS: 7 days     Today's Date: 08/04/2014 SLP Individual Time: 1000-1100 SLP Individual Time Calculation (min): 60 min   Problem List:  Patient Active Problem List   Diagnosis Date Noted  . Debility 08/04/2014  . Anoxic encephalopathy 08/04/2014  . Diastolic CHF 44/01/270  . 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 S/P redo mitral valve replacement with bioprosthetic valve 07/25/2014  . Prosthetic valve dysfunction 07/22/2014  . Shock 07/21/2014  . Cardiac arrest 07/21/2014  . Acute respiratory failure, unspecified whether with hypoxia or hypercapnia   . Metabolic acidosis   . RBBB 03/11/2014  . HTN (hypertension) 01/23/2013  . Dyslipidemia 01/23/2013  . S/P mitral valve replacement with a bileaflet mechanical valve 04/11/1998  . Long term current use of anticoagulant therapy for mechanical mitral valve 04/11/1998   Past Medical History:  Past Medical History  Diagnosis Date  . Hypertension   . Hyperlipidemia   . S/P MVR (mitral valve replacement) 04/11/1998    #29 St. Jude bileaflet mechanical valve for bacterial endocarditis  . 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 S/P redo mitral valve replacement with bioprosthetic valve 07/25/2014    27 mm [facility] Mitral bovine bioprosthetic tissue valve   Past Surgical History:  Past Surgical History  Procedure Laterality Date  . Mitral valve replacement  04/11/1998    St. Jude mechanical MVR (severe MR, episode of streptococcal bacterial endocarditis - Dr. Roxy Manns  . Tubal ligation    . Transthoracic  echocardiogram  11/2009    EF=>55%; bi-leaflet St. Jude mech prosthesis; mild TR, RVSP elevated at 40-66mHg, mod pulm HTN; trace AV regurg; mild pulm valve regurg  . Total abdominal hysterectomy w/ bilateral salpingoophorectomy  12/14/1998  . Cardiac catheterization N/A 07/24/2014    Procedure: Right/Left Heart Cath and Coronary Angiography;  Surgeon: MWellington Hampshire MD;  [facility];  Service: Cardiovascular;  Laterality: N/A;  . Cardiac catheterization N/A 07/22/2014    Procedure: Fluoroscopy Guidance;  Surgeon: TTroy Sine MD;  [facility];  Service: Cardiovascular;  Laterality: N/A;  . Mitral valve replacement N/A 07/25/2014    Procedure: REDO MITRAL VALVE REPLACEMENT (MVR);  Surgeon: CRexene Alberts MD;  [facility]  Service: Open Heart Surgery;  Laterality: N/A;    Assessment / Plan / Recommendation Clinical Impression Patient is a 69y.o. right handed female with history of hypertension, remote history of endocarditis with destruction of mitral valve requiring St. Jude mechanical valve placement by Dr. ORoxy Mannsin April 2000 maintained on chronic Coumadin. Independent prior to admission living alone. Presented 07/21/2014 with altered mental status and  malaise and witnessed PEA arrest in the ED with bradycardia down to the 20s. Patient was severely diaphoretic on arrival. Cardiology as well as critical care medicine follow-up for septic shock and cardiac arrest. Patient required full intubation. TEE completed showing ejection fraction of 55% motions of the leaflets of the mechanical valve in the mitral position appeared restricted. Underwent redo mitral valve replacement 07/25/2014 per Dr. ORoxy Manns Renal function slowly improving creatinine 2.08 with follow-up per renal services felt that renal insufficiency related  to acute decompensated CHF related to cardiogenic shock. Patient was extubated 07/26/2014. Tolerating a regular diet. Physical therapy evaluation  completed and ongoing noting debilitation as well as some difficulty with recall of events as well as memory deficits with recommendations of physical medicine rehabilitation consult.  Patient transferred to CIR on 08/03/2014. Patient demonstrates severe cognitive impairments impacting orientation, selective attention, functional problem solving, recall of functional information and intellectual awareness which impacts her ability to complete functional and familiar tasks safely. Patient will benefit from skilled SLP intervention to maximize cognitive function and overall functional independence prior to discharge.   Skilled Therapeutic Interventions          Administered a cognitive-linguistic evaluation. Please see above for details. Educated patient on current cognitive function and goals of skilled SLP intervention. She verbalized understanding.   SLP Assessment  Patient will need skilled [place] [facility] during CIR admission    Recommendations  Oral Care Recommendations: Oral care BID Recommendations for Other Services: Neuropsych consult Patient destination: Home Follow up Recommendations: 24 hour supervision/assistance;Outpatient SLP Equipment Recommended: None recommended by SLP    SLP Frequency 3 to 5 out of 7 days   SLP Treatment/Interventions Cognitive remediation/compensation;Cueing hierarchy;Functional tasks;Internal/external aids;Environmental controls;Patient/family education;Therapeutic Activities    Pain Pain Assessment Pain Assessment: No/denies pain Prior Functioning Type of Home: House (condo/townhouse)  Lives With: Alone Available Help at Discharge: Family;Available 24 hours/day (daughter Vonna Kotyk) Vocation: Retired  Industrial/product designer Term Goals: Week 1: SLP Short Term Goal 1 (Week 1): Patient will utilize memory compensatory strategies to recall new, daily information with Mod A multimodal cues.  SLP Short Term Goal 2 (Week 1): Patient will utilize call  bell to request assistance in 50% of opportunities with Mod A multimodal cues.  SLP Short Term Goal 3 (Week 1): Patient will complete functional and familiar tasks safely in regards to problem solving with Mod A multimodal cues.  SLP Short Term Goal 4 (Week 1): Patient will demonstrate selective attention in a mildly distracting enviornment for 30 minutes with Min A verbal cues for redirection.  SLP Short Term Goal 5 (Week 1): Patient will utilize external aids for orientaiton with supervision verbal cues.  SLP Short Term Goal 6 (Week 1): Patient will self-monitor and correct errors with functional tasks with Mod A multimodal cues.   See FIM for current functional status Refer to Care Plan for Long Term Goals  Recommendations for other services: Neuropsych  Discharge Criteria: Patient will be discharged from SLP if patient refuses treatment 3 consecutive times without medical reason, if treatment goals not met, if there is a change in medical status, if patient makes no progress towards goals or if patient is discharged from hospital.  The above assessment, treatment plan, treatment alternatives and goals were discussed and mutually agreed upon: by patient  Luzelena Heeg 08/04/2014, 4:25 PM

## 2014-08-04 NOTE — Progress Notes (Signed)
ANTICOAGULATION CONSULT NOTE - Follow Up Consult  Pharmacy Consult for coumadin Indication: heart valve  Allergies  Allergen Reactions  . Augmentin [Amoxicillin-Pot Clavulanate]     Patient Measurements: Weight: 160 lb 9.6 oz (72.848 kg) Heparin Dosing Weight:   Vital Signs: Temp: 98.7 F (37.1 C) (07/28 0459) Temp Source: Oral (07/28 0459) BP: 122/75 mmHg (07/28 0459) Pulse Rate: 86 (07/28 0459)  Labs:  Recent Labs  08/02/14 0335 08/03/14 0413 08/04/14 0610  HGB 8.5*  --  8.5*  HCT 26.1*  --  25.8*  PLT 152  --  191  LABPROT 18.9* 19.3* 24.1*  INR 1.58* 1.63* 2.18*  CREATININE 2.06* 2.08* 2.18*    Estimated Creatinine Clearance: 25.9 mL/min (by C-G formula based on Cr of 2.18).   Medications:  Scheduled:  . amiodarone  200 mg Oral BID PC  . antiseptic oral rinse  7 mL Mouth Rinse BID  . aspirin EC  81 mg Oral Daily  . atorvastatin  20 mg Oral q1800  . bisacodyl  10 mg Oral Daily   Or  . bisacodyl  10 mg Rectal Daily  . carvedilol  6.25 mg Oral BID WC  . furosemide  40 mg Oral BID  . pantoprazole  40 mg Oral Daily  . potassium chloride  20 mEq Oral BID  . saccharomyces boulardii  250 mg Oral BID   Infusions:    Assessment: 69 yo female with mechanical heart valve and new tissue vlave is currently on subtherapeutic coumadin.  INR today is 2.18 from 1.63.  Patient was on cipro and still on amiodarone.  Goal of Therapy:  INR 2.5-3.5 Monitor platelets by anticoagulation protocol: Yes   Plan:  - coumadin 1 mg po x1 - INR in am  Wells Gerdeman, Tsz-Yin 08/04/2014,8:16 AM

## 2014-08-04 NOTE — Progress Notes (Signed)
[name]     PROGRESS NOTE    Subjective/Complaints: Numerous loose stools overnight. Received dulcolax and colace on 7/27. No fever, chill, belly pain. Otherwise doing well.   ROS: Pt denies fever, rash/itching, headache, blurred or double vision, nausea, vomiting, abdominal pain, chest pain, shortness of breath, palpitations, dysuria, dizziness, neck or back pain, bleeding, anxiety, or depression   Objective: Vital Signs: Blood pressure 122/75, pulse 86, temperature 98.7 F (37.1 C), temperature source Oral, resp. rate 18, weight 72.848 kg (160 lb 9.6 oz), SpO2 100 %. No results found.  Recent Labs  08/02/14 0335 08/04/14 0610  WBC 11.9* 10.7*  HGB 8.5* 8.5*  HCT 26.1* 25.8*  PLT 152 191    Recent Labs  08/03/14 0413 08/04/14 0610  NA 136 136  K 4.0 4.2  CL 102 103  GLUCOSE 110* 101*  BUN 16 18  CREATININE 2.08* 2.18*  CALCIUM 10.1 10.1   CBG (last 3)  No results for input(s): GLUCAP in the last 72 hours.  Wt Readings from Last 3 Encounters:  08/04/14 72.848 kg (160 lb 9.6 oz)  08/03/14 71.714 kg (158 lb 1.6 oz)  03/11/14 68.72 kg (151 lb 8 oz)    Physical Exam:  Constitutional: She appears well-developed.  HENT: oral mucosa pink and moist Head: Normocephalic.  Eyes: EOM are normal.  Neck: Normal range of motion. Neck supple. No thyromegaly present.  Cardiovascular:  Cardiac rate controlled. No murmurs.no LE edema.  Respiratory: Effort normal and breath sounds normal. No respiratory distress.  GI: Soft. Bowel sounds are normal. She exhibits no distension.  Neurological: She is alert.  Mood is flat but appropriate. She makes good eye contact with examiner. She was able to provide her age but needed some cues for dates. Fair insight and awareness.  Needs cues for higher level tasks and processes as well as language. Follows simple commands. UE 4/5 prox to distal with inhibition due to pain/sternum. LE: 2+ hf, 3ke,  4/5 adf/afp. Decreased sensation in distal feet/toes.  Skin: Skin is warm and dry. Wound incision healing nicely along chest Psychiatric: She has a normal mood and affect. pleasant   Assessment/Plan: 1. Functional deficits secondary to debility/anoxic encephalopathy which require 3+ hours per day of interdisciplinary therapy in a comprehensive inpatient rehab setting. Physiatrist is providing close team supervision and 24 hour management of active medical problems listed below. Physiatrist and rehab team continue to assess barriers to discharge/monitor patient progress toward functional and medical goals. FIM:       FIM - Toileting Toileting steps completed by patient: Performs perineal hygiene, Adjust clothing after toileting Toileting: 3: Mod-Patient completed 2 of 3 steps           Comprehension Comprehension Mode: Auditory Comprehension: 3-Understands basic 50 - 74% of the time/requires cueing 25 - 50%  of the time  Expression Expression Mode: Verbal Expression: 3-Expresses basic 50 - 74% of the time/requires cueing 25 - 50% of the time. Needs to repeat parts of sentences.  Social Interaction Social Interaction: 4-Interacts appropriately 75 - 89% of the time - Needs redirection for appropriate language or to initiate interaction.  Problem Solving Problem Solving: 3-Solves basic 50 - 74% of the time/requires cueing 25 - 49% of the time  Memory Memory: 2-Recognizes or recalls 25 - 49% of the time/requires cueing 51 - 75% of the time  Medical Problem List and Plan: 1. Functional deficits secondary to debilitation and probable anoxic encephalopathy after cardiac arrest/sepsis, subsequent mitral  valve redo  2. DVT Prophylaxis/Anticoagulation: Chronic Coumadin therapy. INR therapeutic today 3. Pain Management: Ultram as needed. Monitor pain needs with increased mobility 4. Acute blood loss anemia. hgb 8.5 (unchanged. Continue serial follow up 5. Neuropsych: This  patient is capable of making decisions on her own behalf. 6. Skin/Wound Care: Routine skin checks 7. Fluids/Electrolytes/Nutrition: electrolytes normal. 8. Hypertension/atrial fibrillation. Amiodarone 200 mg twice a day, Coreg 6.25 mg twice a day, . Good control at present 9. Diastolic congestive heart failure. Continue Lasix 40 mg twice daily. Monitor for any signs of fluid overload -following weights and i's and o's, weight 72.8kg today 9. Acute on chronic renal insufficiency. Baseline creatinine 1.48---- now around creatinine 2.06 over last few lab checks. . Renal ultrasound negative. No specific intervention at this time. 10. Hyperlipidemia. Lipitor 11. Diarrhea: likely laxative induced.  -wbc's decreased  -negative c diff on 7/13  -imodium, probiotic  -observe today  LOS (Days) 1 A FACE TO FACE EVALUATION WAS PERFORMED  SWARTZ,ZACHARY T 08/04/2014 8:01 AM

## 2014-08-04 NOTE — Progress Notes (Signed)
Andrey Farmer Stefannie Defeo Rehab Admission Coordinator Signed Physical Medicine and Rehabilitation PMR Pre-admission 08/03/2014 10:32 AM  Related encounter: ED to Hosp-Admission (Discharged) from 07/21/2014 in [facility] 2W CARDIAC UNIT    Expand All Collapse All   PMR Admission Coordinator Pre-Admission Assessment  Patient: [name] is an 69 y.o., female MRN: [id number] DOB: October 23, 1945 Height: 5\' 6"  (167.6 cm) Weight: 71.714 kg (158 lb 1.6 oz)  Insurance Information  PRIMARY: Medicare A & B Policy#: [id number] Subscriber: self Pre-Cert#: verified in [employer]: retired Financial risk analyst. Date: A & B: 08-07-12 Deduct: $1288 Out of Pocket Max: none Life Max: unlimited CIR: 100% SNF: 100% days 1-20; 80% days 21-100 (100 days max) Outpatient: 80/20% Co-Pay: none Home Health: 100% Co-Pay: none DME: 80/20% Co-Pay: none Providers: pt's preference  Note: pt's name is listed in Medicare system (and on her Medicare card) as "[name]"  SECONDARY: AARP Supplement Policy#: [id number] Subscriber: self Benefits: Phone #: [phone]   Emergency Contact Information Contact Information    Name Relation Home Work Mobile   Johnson,[name] Daughter [phone]  [phone]   Goode,Brittany Niece   [phone]     Current Medical History  Patient Admitting Diagnosis: Debilitated after cardiac arrest status post redo mitral valve replacement  History of Present Illness: [name] is a 69 y.o. right handed female with history of hypertension, remote history of endocarditis with destruction of mitral valve requiring St. Jude mechanical valve placement by Dr. Cornelius Moras in April 2000 maintained on chronic Coumadin. Independent prior to  admission living alone. Presented 07/21/2014 with altered mental status and malaise and witnessed PEA arrest in the ED with bradycardia down to the 20s. Patient was severely diaphoretic on arrival. Noted creatinine 3.57. Troponin 0.95. EKG showed right bundle branch block and left anterior fascicular block consistent with previous EKG from 03/11/2014.Marland Kitchen Renal ultrasound with no hydronephrosis. Cardiology as well as critical care medicine follow-up for septic shock and cardiac arrest. Patient required full intubation. TEE completed showing ejection fraction of 55% motions of the leaflets of the mechanical valve in the mitral position appeared restricted. Underwent redo mitral valve replacement 07/25/2014 per Dr. Cornelius Moras. Hospital course pain management. Renal function slowly improving creatinine 2.06. Chronic Coumadin ongoing. Patient was extubated 07/26/2014. Tolerating a regular diet. Physical therapy evaluation completed and ongoing noting debilitation as well as some difficulty with recall of events as well as memory deficits with recommendations of physical medicine rehabilitation consult.   Past Medical History  Past Medical History  Diagnosis Date  . Hypertension   . Hyperlipidemia   . S/P MVR (mitral valve replacement) 04/11/1998    #29 St. Jude bileaflet mechanical valve for bacterial endocarditis  . 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 S/P redo mitral valve replacement with bioprosthetic valve 07/25/2014    27 mm [facility] Mitral bovine bioprosthetic tissue valve    Family History  family history includes Heart failure in her maternal grandfather and maternal grandmother; Lung cancer in her mother.  Prior Rehab/Hospitalizations:  Has the  patient had major surgery during 100 days prior to admission? No   Pt had previous cardiac rehab after MVR in 2000.   Current Medications   Current facility-administered medications:  . 0.9 % sodium chloride infusion, 250 mL, Intravenous, Continuous, Purcell Nails, MD, Stopped  at 07/27/14 1700 . 0.9 % sodium chloride infusion, , Intravenous, Once, Purcell Nails, MD . 0.9 % sodium chloride infusion, , Intravenous, Once, Purcell Nails, MD . 0.9 % sodium chloride infusion, 250 mL, Intravenous, PRN, Purcell Nails, MD . acetaminophen (TYLENOL) tablet 650 mg, 650 mg, Oral, Q4H PRN, Purcell Nails, MD . amiodarone (PACERONE) tablet 200 mg, 200 mg, Oral, BID PC, Purcell Nails, MD, 200 mg at 08/03/14 1015 . antiseptic oral rinse (CPC / CETYLPYRIDINIUM CHLORIDE 0.05%) solution 7 mL, 7 mL, Mouth Rinse, BID, Purcell Nails, MD, 7 mL at 08/02/14 2204 . aspirin EC tablet 81 mg, 81 mg, Oral, Daily, 81 mg at 08/03/14 1015 **OR** [DISCONTINUED] aspirin chewable tablet 324 mg, 324 mg, Per Tube, Daily, Purcell Nails, MD . atorvastatin (LIPITOR) tablet 20 mg, 20 mg, Oral, q1800, Marquita Palms, RPH, 20 mg at 08/02/14 1727 . bisacodyl (DULCOLAX) EC tablet 10 mg, 10 mg, Oral, Daily, 10 mg at 08/03/14 1016 **OR** bisacodyl (DULCOLAX) suppository 10 mg, 10 mg, Rectal, Daily, Purcell Nails, MD . carvedilol (COREG) tablet 6.25 mg, 6.25 mg, Oral, BID WC, Purcell Nails, MD, 6.25 mg at 08/03/14 1016 . ciprofloxacin (CIPRO) tablet 500 mg, 500 mg, Oral, BID, Purcell Nails, MD, 500 mg at 08/03/14 1016 . docusate sodium (COLACE) capsule 200 mg, 200 mg, Oral, Daily, Purcell Nails, MD, 200 mg at 08/03/14 1015 . furosemide (LASIX) tablet 40 mg, 40 mg, Oral, BID, Purcell Nails, MD, 40 mg at 08/03/14 1016 . ondansetron (ZOFRAN) injection 4 mg, 4 mg, Intravenous, Q6H PRN, Purcell Nails, MD . pantoprazole (PROTONIX) EC tablet 40 mg, 40 mg, Oral, Daily, Purcell Nails, MD, 40 mg at  08/03/14 1017 . potassium chloride SA (K-DUR,KLOR-CON) CR tablet 20 mEq, 20 mEq, Oral, BID, Purcell Nails, MD, 20 mEq at 08/03/14 1017 . sodium chloride 0.9 % injection 3 mL, 3 mL, Intravenous, Q12H, Purcell Nails, MD, 3 mL at 08/03/14 1018 . sodium chloride 0.9 % injection 3 mL, 3 mL, Intravenous, PRN, Purcell Nails, MD . sodium chloride 0.9 % injection 3 mL, 3 mL, Intravenous, Q12H, Purcell Nails, MD, 3 mL at 08/03/14 1017 . sodium chloride 0.9 % injection 3 mL, 3 mL, Intravenous, PRN, Purcell Nails, MD . traMADol Janean Sark) tablet 50 mg, 50 mg, Oral, Q12H PRN, Purcell Nails, MD, 50 mg at 08/01/14 2300 . warfarin (COUMADIN) tablet 1 mg, 1 mg, Oral, q1800, Purcell Nails, MD, 1 mg at 08/02/14 1726 . Warfarin - Physician Dosing Inpatient, , Does not apply, q1800, Purcell Nails, MD  Patients Current Diet: Diet Heart Room service appropriate?: Yes; Fluid consistency:: Thin  Precautions / Restrictions Precautions Precautions: Sternal, Fall Precaution Comments:  Restrictions Weight Bearing Restrictions: No   Has the patient had 2 or more falls or a fall with injury in the past year?No  Prior Activity Level Community (5-7x/wk): Pt got out everyday and babysat her one year old granddtr everyday while her dtr works.  Home Assistive Devices / Equipment Home Assistive Devices/Equipment: None Home Equipment: None  Prior Device Use: Indicate devices/aids used by the patient prior to current illness, exacerbation or injury? None of the above  Prior Functional Level Prior Function Level of Independence: Independent Comments: retired from [employer]  Self Care: Did the patient need help bathing, dressing, using the toilet or eating? Independent  Indoor Mobility: Did the patient need assistance with walking from room to room (with or without device)?  Independent  Stairs: Did the patient need assistance with internal or external stairs (with or without device)?  Independent  Functional Cognition: Did the patient need help planning regular tasks such as shopping or remembering to take medications? Independent  Current Functional Level Cognition  Arousal/Alertness: Awake/alert Overall Cognitive Status: Impaired/Different from baseline Current Attention Level: Sustained Orientation Level: Oriented to person, Oriented to place, Disoriented to time Following Commands: Follows one step commands with increased time Safety/Judgement: Decreased awareness of safety, Decreased awareness of deficits General Comments: sitting EOB alone and about to get to [facility] alone; easily frustrated (? due to earlier SLP cognitive eval) Attention: Sustained Sustained Attention: Impaired Sustained Attention Impairment: Functional basic, Verbal basic Memory: Impaired Memory Impairment: Storage deficit, Retrieval deficit, Decreased recall of new information Awareness: Impaired Awareness Impairment: Intellectual impairment, Emergent impairment, Anticipatory impairment Problem Solving: Impaired Problem Solving Impairment: Functional basic Safety/Judgment: Impaired   Extremity Assessment (includes Sensation/Coordination)  Upper Extremity Assessment: Overall WFL for tasks assessed  Lower Extremity Assessment: Generalized weakness    ADLs   not assessed, anticipate needs    Mobility  Overal bed mobility: Needs Assistance Bed Mobility: Sit to Sidelying Rolling: Min guard Sit to sidelying: Min guard General bed mobility comments: max cues for technique to maintain sternal precautions (pt unable to recall any part of process) difficulty raising legs with nearly needing assist    Transfers  Overall transfer level: Needs assistance Equipment used: None Transfers: Sit to/from Stand, Stand Pivot Transfers Sit to Stand: Min guard Stand pivot transfers: Min guard General transfer comment: pt insisted on BSC in front of her (required 180* turn) vs beside her  (90* turn); transfers x 3    Ambulation / Gait / Stairs / Wheelchair Mobility  Ambulation/Gait Ambulation/Gait assistance: Architect (Feet): 30 Feet Assistive device: None Gait Pattern/deviations: Step-through pattern, Decreased stride length, Wide base of support General Gait Details: without RW also drifts slightly to her right, especially as turning right; incr lateral sway Gait velocity: decr Gait velocity interpretation: Below normal speed for age/gender    Posture / Balance Static Standing Balance Rhomberg - Eyes Opened: (30 seconds with incr sway and min guard) Rhomberg - Eyes Closed: (10 sec with incr sway and then step posterior; min assist) Balance Overall balance assessment: Needs assistance Sitting-balance support: No upper extremity supported, Feet supported Sitting balance-Leahy Scale: Fair Standing balance support: No upper extremity supported Standing balance-Leahy Scale: Poor Rhomberg - Eyes Opened: (30 seconds with incr sway and min guard) Rhomberg - Eyes Closed: (10 sec with incr sway and then step posterior; min assist) High Level Balance Comments: pt refused balance or standing exercises; frustrated/fatigued    Special needs/care consideration BiPAP/CPAP no CPM no  Continuous Drip IV no  Dialysis no  Life Vest no  Oxygen no  Special Bed no  Trach Size no  Wound Vac (area) no  Skin - sternal incision  Bowel mgmt: last BM on 08-02-14 Bladder mgmt: using BSC with assist, urinary urgency and frequency Diabetic mgmt no   Previous Home Environment Living Arrangements: Alone Lives With: Alone Available Help at Discharge: Family, Available 24 hours/day Type of Home: Other(Comment) (townhouse) Home Layout: One level Home Access: Level entry Bathroom Shower/Tub: Tub/shower unit, Engineer, building services: Standard Home Care Services: No  Discharge Living Setting Plans for  Discharge Living Setting: Other (Comment) (plan is to go to Dtr's home) Type of Home at Discharge: House Discharge Home Layout: One level Discharge Home Access: Level entry Does the patient have  any problems obtaining your medications?: No  Social/Family/Support Systems Patient Roles: Other (Comment) (babysits her one year old granddtr) Contact Information: dtr [name] is primary contact Anticipated Caregiver: [name] and supportive family Anticipated Caregiver's Contact Information: see above Ability/Limitations of Caregiver: no limitations as dtr plans to take time off work Garment/textile technologist is a Armed forces operational officer) and plans to have pt stay with her until pt is able to return home independently to her apartment. Caregiver Availability: 24/7 Discharge Plan Discussed with Primary Caregiver: Yes Is Caregiver In Agreement with Plan?: Yes Does Caregiver/Family have Issues with Lodging/Transportation while Pt is in Rehab?: No  Goals/Additional Needs Patient/Family Goal for Rehab: Mod Ind with PT/OT/SLP Expected length of stay: 7 days Cultural Considerations: none Dietary Needs: heart healthy Equipment Needs: to be determined Pt/Family Agrees to Admission and willing to participate: Yes (spoke with pt and dtr (by phone)) Program Orientation Provided & Reviewed with Pt/Caregiver Including Roles & Responsibilities: Yes   Decrease burden of Care through IP rehab admission: NA   Possible need for SNF placement upon discharge: not anticipated   Patient Condition: This patient's condition remains as documented in the consult dated 08-02-14, in which the Rehabilitation Physician determined and documented that the patient's condition is appropriate for intensive rehabilitative care in an inpatient rehabilitation facility. Will admit to inpatient rehab today.  Preadmission Screen Completed By: Juliann Mule, PT, 08/03/2014 11:01  AM ______________________________________________________________________  Discussed status with Dr. Riley Kill on 08-03-14 at 1051 and received telephone approval for admission today.  Admission Coordinator: Juliann Mule, PT, time1051/Date 08-03-14          Cosigned by: Ranelle Oyster, MD at 08/03/2014 11:44 AM  Revision History     Date/Time User Provider Type Action   08/03/2014 11:44 AM Ranelle Oyster, MD Physician Cosign   08/03/2014 11:03 AM Andrey Farmer Deniel Mcquiston Rehab Admission Coordinator Sign

## 2014-08-04 NOTE — Progress Notes (Signed)
Nutrition Brief Note  Patient identified on the Malnutrition Screening Tool (MST) Report  Wt Readings from Last 15 Encounters:  08/04/14 160 lb 9.6 oz (72.848 kg)  08/03/14 158 lb 1.6 oz (71.714 kg)  03/11/14 151 lb 8 oz (68.72 kg)  01/21/13 164 lb 1.6 oz (74.435 kg)    Body mass index is 25.93 kg/(m^2). Patient meets criteria for overweight based on current BMI.   Current diet order is heart, patient is consuming approximately 100% of meals at this time. Pt reports having a good appetite currently and PTA at home with no other difficulties. Pt with no observed significant fat or muscle mass loss. Labs and medications reviewed.   No nutrition interventions warranted at this time. If nutrition issues arise, please consult RD.   Roslyn Smiling, MS, RD, LDN Pager # [phone] After hours/ weekend pager # [phone]

## 2014-08-04 NOTE — Evaluation (Signed)
Occupational Therapy Assessment and Plan  Patient Details  Name: [name] MRN: [id number] Date of Birth: February 11, 1945  OT Diagnosis: cognitive deficits, disturbance of vision and muscle weakness (generalized) Rehab Potential: Rehab Potential (ACUTE ONLY): Good ELOS: 7-10 days   Today's Date: 08/04/2014 OT Individual Time: 0830-1010 OT Individual Time Calculation (min): 100 min     Problem List:  Patient Active Problem List   Diagnosis Date Noted  . Debility 08/04/2014  . Anoxic encephalopathy 08/04/2014  . Diastolic CHF 14/48/1856  . 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 S/P redo mitral valve replacement with bioprosthetic valve 07/25/2014  . Prosthetic valve dysfunction 07/22/2014  . Shock 07/21/2014  . Cardiac arrest 07/21/2014  . Acute respiratory failure, unspecified whether with hypoxia or hypercapnia   . Metabolic acidosis   . RBBB 03/11/2014  . HTN (hypertension) 01/23/2013  . Dyslipidemia 01/23/2013  . S/P mitral valve replacement with a bileaflet mechanical valve 04/11/1998  . Long term current use of anticoagulant therapy for mechanical mitral valve 04/11/1998    Past Medical History:  Past Medical History  Diagnosis Date  . Hypertension   . Hyperlipidemia   . S/P MVR (mitral valve replacement) 04/11/1998    #29 St. Jude bileaflet mechanical valve for bacterial endocarditis  . 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 S/P redo mitral valve replacement with bioprosthetic valve 07/25/2014    27 mm [facility] Mitral bovine bioprosthetic tissue valve   Past Surgical History:  Past Surgical History  Procedure Laterality Date  . Mitral valve replacement  04/11/1998    St. Jude mechanical MVR (severe MR, episode of streptococcal bacterial  endocarditis - Dr. Roxy Manns  . Tubal ligation    . Transthoracic echocardiogram  11/2009    EF=>55%; bi-leaflet St. Jude mech prosthesis; mild TR, RVSP elevated at 40-80mHg, mod pulm HTN; trace AV regurg; mild pulm valve regurg  . Total abdominal hysterectomy w/ bilateral salpingoophorectomy  12/14/1998  . Cardiac catheterization N/A 07/24/2014    Procedure: Right/Left Heart Cath and Coronary Angiography;  Surgeon: MWellington Hampshire MD;  [place] [facility];  Service: Cardiovascular;  Laterality: N/A;  . Cardiac catheterization N/A 07/22/2014    Procedure: Fluoroscopy Guidance;  Surgeon: TTroy Sine MD;  [facility];  Service: Cardiovascular;  Laterality: N/A;  . Mitral valve replacement N/A 07/25/2014    Procedure: REDO MITRAL VALVE REPLACEMENT (MVR);  Surgeon: CRexene Alberts MD;  [facility]  Service: Open Heart Surgery;  Laterality: N/A;    Assessment & Plan Clinical Impression: Patient is a 69y.o. right handed female with history of hypertension, remote history of endocarditis with destruction of mitral valve requiring St. Jude mechanical valve placement by Dr. ORoxy Mannsin April 2000 maintained on chronic Coumadin. Independent prior to admission living alone. Presented 07/21/2014 with altered mental status and malaise and witnessed PEA arrest in the ED with bradycardia down to the 20s. Patient was severely diaphoretic on arrival. Noted creatinine 3.57. Troponin 0.95. EKG showed right bundle branch block and left anterior fascicular block consistent with previous EKG from 03/11/2014..[facility] KitchenRenal ultrasound with no hydronephrosis. Cardiology as well as critical care medicine follow-up for septic shock and cardiac arrest. Patient required full intubation. TEE completed showing ejection fraction of 55% motions of the leaflets of  the mechanical valve in the mitral position appeared restricted. Underwent redo mitral valve replacement 07/25/2014 per Dr. Roxy Manns. Hospital course pain  management. Renal function slowly improving creatinine 2.08 with follow-up per renal services felt that renal insufficiency related to acute decompensated CHF related to cardiogenic shock. Chronic Coumadin ongoing. Acute blood loss anemia 8.5 and monitored. Patient was extubated 07/26/2014. Tolerating a regular diet.   Patient transferred to CIR on 08/03/2014 .    Patient currently requires moderate assistance with basic self-care skills secondary to decreased cardiorespiratoy endurance and decreased initiation, decreased problem solving and decreased memory.  Prior to hospitalization, patient could complete BADL/iADL independently .  Patient will benefit from skilled intervention to increase independence with basic self-care skills and increase level of independence with iADL prior to discharge home with care partner.  Anticipate patient will require 24 hour supervision and follow up home health.  OT - End of Session Activity Tolerance: Tolerates 10 - 20 min activity with multiple rests Endurance Deficit: Yes OT Assessment Rehab Potential (ACUTE ONLY): Good OT Patient demonstrates impairments in the following area(s): Cognition;Endurance;Safety;Balance OT Basic ADL's Functional Problem(s): Eating;Grooming;Bathing;Dressing;Toileting OT Advanced ADL's Functional Problem(s): Simple Meal Preparation;Light Housekeeping OT Transfers Functional Problem(s): Toilet;Tub/Shower OT Additional Impairment(s): None OT Plan OT Intensity: Minimum of 1-2 x/day, 45 to 90 minutes OT Frequency: 5 out of 7 days OT Duration/Estimated Length of Stay: 7-10 days OT Treatment/Interventions: Cognitive remediation/compensation;Patient/family education;Self Care/advanced ADL retraining;Therapeutic Activities;DME/adaptive equipment instruction;Functional mobility training;Discharge planning OT Self Feeding Anticipated Outcome(s): Mod I OT Basic Self-Care Anticipated Outcome(s): Supervision OT Toileting Anticipated  Outcome(s): Supervision OT Bathroom Transfers Anticipated Outcome(s): Supervision OT Recommendation Recommendations for Other Services: Neuropsych consult (Suspicious presentation d/t reported awareness of cognitive decline and primary role with childcare.) Patient destination: Home Follow Up Recommendations: Home health OT Equipment Recommended: To be determined   Skilled Therapeutic Intervention OT 1:1 initial evaluation completed with treatment provided to include re-orientation to place and situation, role of therapy and plan of care, pt ed on use of DME and adherence to chest precautions.   Pt received awake, supine in bed with HOB elevated in no distress and awaiting breakfast tray.   Pt required repeated clarification of service (OT evaluation) and extra time to process instructions with improved performance noted when given simple direct commands versus options d/t pt's continued confusion.   Pt able to rise out of bed, ambulate to bathroom using RW, perform transfers with overall min assist and min instructional cues, and bathe and dress at sink with overall moderate assistance.   Pt demonstrates delayed processing, severe memory impairment, and required mod vc to sequence during entire session.   Pt recovered to standard chair at end of session and was received by SLP.  OT Evaluation Precautions/Restrictions  Restrictions Weight Bearing Restrictions: No  Vital Signs Therapy Vitals Pulse Rate: 95 Resp: 18 BP: 96/77 mmHg Patient Position (if appropriate): Sitting Oxygen Therapy SpO2: (!) 84 % O2 Device: Not Delivered  Pain Pain Assessment Pain Score: 0-No pain  Home Living/Prior Functioning Home Living Family/patient expects to be discharged to:: Private residence Living Arrangements: Alone Available Help at Discharge: Family, Available 24 hours/day Type of Home: House (daughter or son live in houses, pt lives in [place]) Home Access: Level entry Home Layout: One  level Bathroom Shower/Tub: Public librarian, Architectural technologist: Standard Bathroom Accessibility: Yes  Lives With: Alone IADL History Homemaking Responsibilities: Yes Meal Prep Responsibility: Therapist, occupational Responsibility: Primary Cleaning Responsibility: Primary Bill Paying/Finance Responsibility: Primary Shopping Responsibility: Primary Child Care Responsibility:  Primary Homemaking Comments: Provides childcare to granddaughter while her daughter who works full-time as Chiropodist. 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, Marketing executive for [employer], unable to recall date or year she left Leisure and Hobbies: Sew, last sewed about 3 years ago Prior Function Level of Independence: Independent with homemaking with ambulation, Independent with basic ADLs, Independent with transfers, Independent with gait  Able to Take Stairs?: Yes Driving: Yes Vocation: Retired Comments: retired from [employer]  ADL ADL ADL Comments: see FIM  Vision/Perception  Vision- History Baseline Vision/History: Wears glasses Wears Glasses: Reading only Patient Visual Report: Blurring of vision Vision- Assessment Vision Assessment?: Vision impaired- to be further tested in functional context   Cognition Orientation Level:  (incorrect situation (reports "stroke")) Year:  (reported 1962) Month: July Day of Week: Incorrect Memory: Impaired Immediate Memory Recall: Sock;Blue;Bed Memory Recall: Blue (missed sock and bed; reported "blue sock" as one item) Memory Recall Blue: Without Cue Attention: Focused Focused Attention: Appears intact Sustained Attention: Impaired Sustained Attention Impairment: Functional basic;Verbal basic Awareness: Impaired Awareness Impairment: Intellectual impairment;Emergent impairment;Anticipatory impairment Problem Solving Impairment: Verbal complex Executive Function:  Organizing;Decision Making;Sequencing Sequencing: Impaired Sequencing Impairment: Verbal basic Organizing: Impaired Organizing Impairment: Functional basic Decision Making: Impaired Decision Making Impairment: Verbal complex;Functional basic Safety/Judgment: Impaired  Sensation Sensation Light Touch: Appears Intact Stereognosis: Appears Intact Hot/Cold: Appears Intact Proprioception: Appears Intact Coordination Gross Motor Movements are Fluid and Coordinated: Yes Fine Motor Movements are Fluid and Coordinated: Yes  Motor  Motor Motor: Within Functional Limits  Mobility  Bed Mobility Bed Mobility: Rolling Right Rolling Right: 5: Supervision Rolling Right Details: Verbal cues for precautions/safety Right Sidelying to Sit: 4: Min guard Sitting - Scoot to Edge of Bed: 5: Supervision Sitting - Scoot to [employer] of Bed Details: Verbal cues for precautions/safety Transfers Transfers: Sit to Stand;Stand to Sit Sit to Stand: 5: Supervision Stand to Sit: 4: Min guard;Without upper extremity assist   Trunk/Postural Assessment  Cervical Assessment Cervical Assessment: Within Functional Limits Thoracic Assessment Thoracic Assessment: Within Functional Limits Lumbar Assessment Lumbar Assessment: Within Functional Limits Postural Control Postural Control: Within Functional Limits   Balance Balance Balance Assessed: No  Extremity/Trunk Assessment RUE Assessment RUE Assessment: Within Functional Limits LUE Assessment LUE Assessment: Within Functional Limits  FIM:  FIM - Eating Eating Activity: 6: More than reasonable amount of time;5: Supervision/cues FIM - Grooming Grooming: 0: Activity did not occur FIM - Bathing Bathing Steps Patient Completed: Chest;Abdomen;Front perineal area;Buttocks Bathing: 2: Max-Patient completes 3-4 6f10 parts or 25-49% FIM - Upper Body Dressing/Undressing Upper body dressing/undressing steps patient completed: Thread/unthread right bra  strap;Thread/unthread left bra strap;Thread/unthread right sleeve of pullover shirt/dresss;Thread/unthread left sleeve of pullover shirt/dress;Put head through opening of pull over shirt/dress Upper body dressing/undressing: 4: Min-Patient completed 75 plus % of tasks FIM - Lower Body Dressing/Undressing Lower body dressing/undressing: 2: Max-Patient completed 25-49% of tasks FIM - Toileting Toileting steps completed by patient: Performs perineal hygiene;Adjust clothing after toileting Toileting: 3: Mod-Patient completed 2 of 3 steps FIM - Bed/Chair Transfer Bed/Chair Transfer Assistive Devices: HOB elevated Bed/Chair Transfer: 4: Supine > Sit: Min A (steadying Pt. > 75%/lift 1 leg);4: Bed > Chair or W/C: Min A (steadying Pt. > 75%);5: Sit > Supine: Supervision (verbal cues/safety issues);5: Chair or W/C > Bed: Supervision (verbal cues/safety issues) FIM - TRadio producerDevices: WMining engineerTransfers: 4-To toilet/BSC: Min A (steadying Pt. > 75%);4-From toilet/BSC: Min  A (steadying Pt. > 75%) FIM - Systems developer Devices: Walk in [employer];Tub transfer bench Tub/shower Transfers: 4-Into Tub/Shower: Min A (steadying Pt. > 75%/lift 1 leg);4-Out of Tub/Shower: Min A (steadying Pt. > 75%/lift 1 leg)   Refer to Care Plan for Long Term Goals  Recommendations for other services: Neuropsych (due to suspicion of cognitive decline PTA, pt reports awareness of memory and orientation deficits.   Pt states she is also primary caregiver to 51 y/o granddaughter, M-F, while her daughter works)  Discharge Criteria: Patient will be discharged from OT if patient refuses treatment 3 consecutive times without medical reason, if treatment goals not met, if there is a change in medical status, if patient makes no progress towards goals or if patient is discharged from hospital.  The above assessment, treatment plan, treatment alternatives and  goals were discussed and mutually agreed upon: by patient  [facility] 08/04/2014, 1:08 PM

## 2014-08-04 NOTE — Progress Notes (Signed)
Ranelle Oyster, MD Physician Signed Physical Medicine and Rehabilitation Consult Note 08/02/2014 11:26 AM  Related encounter: ED to Hosp-Admission (Discharged) from 07/21/2014 in [facility] 2W CARDIAC UNIT    Expand All Collapse All        Physical Medicine and Rehabilitation Consult Reason for Consult: Debilitated after cardiac arrest status post redo mitral valve replacement Referring Physician: Cornelius Moras   HPI: [name] is a 69 y.o. right handed female with history of hypertension, remote history of endocarditis with destruction of mitral valve requiring St. Jude mechanical valve placement by Dr. Cornelius Moras in April 2000 maintained on chronic Coumadin. Independent prior to admission living alone. Presented 07/21/2014 with altered mental status and malaise and witnessed PEA arrest in the ED with bradycardia down to the 20s. Patient was severely diaphoretic on arrival. Noted creatinine 3.57. Troponin 0.95. EKG showed right bundle branch block and left anterior fascicular block consistent with previous EKG from 03/11/2014.Marland Kitchen Renal ultrasound with no hydronephrosis. Cardiology as well as critical care medicine follow-up for septic shock and cardiac arrest. Patient required full intubation. TEE completed showing ejection fraction of 55% motions of the leaflets of the mechanical valve in the mitral position appeared restricted. Underwent redo mitral valve replacement 07/25/2014 per Dr. Cornelius Moras. Hospital course pain management. Renal function slowly improving creatinine 2.06. Chronic Coumadin ongoing. Patient was extubated 07/26/2014. Tolerating a regular diet. Physical therapy evaluation completed and ongoing noting debilitation as well as some difficulty with recall of events as well as memory deficits with recommendations of physical medicine rehabilitation consult.    Review of Systems  Constitutional: Positive for malaise/fatigue. Negative for fever and chills.  HENT: Negative for  hearing loss.  Respiratory: Positive for shortness of breath. Negative for cough and wheezing.  Cardiovascular: Positive for chest pain and palpitations.  Gastrointestinal: Positive for heartburn and constipation. Negative for abdominal pain.  Genitourinary: Negative for dysuria and urgency.  Musculoskeletal: Positive for myalgias. Negative for falls.  Skin: Negative for rash.  Neurological: Positive for dizziness and weakness. Negative for seizures and headaches.   Past Medical History  Diagnosis Date  . Hypertension   . Hyperlipidemia   . S/P MVR (mitral valve replacement) 04/11/1998    #29 St. Jude bileaflet mechanical valve for bacterial endocarditis  . 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 S/P redo mitral valve replacement with bioprosthetic valve 07/25/2014    27 mm [facility] Mitral bovine bioprosthetic tissue valve   Past Surgical History  Procedure Laterality Date  . Mitral valve replacement  04/11/1998    St. Jude mechanical MVR (severe MR, episode of streptococcal bacterial endocarditis - Dr. Cornelius Moras  . Tubal ligation    . Transthoracic echocardiogram  11/2009    EF=>55%; bi-leaflet St. Jude mech prosthesis; mild TR, RVSP elevated at 40-32mmHg, mod pulm HTN; trace AV regurg; mild pulm valve regurg  . Total abdominal hysterectomy w/ bilateral salpingoophorectomy  12/14/1998  . Cardiac catheterization N/A 07/24/2014    Procedure: Right/Left Heart Cath and Coronary Angiography; Surgeon: Iran Ouch, MD; Location: [facility] INVASIVE CV LAB; Service: Cardiovascular; Laterality: N/A;  . Cardiac catheterization N/A 07/22/2014    Procedure: Fluoroscopy Guidance; Surgeon: Lennette Bihari, MD; Location: [facility]  INVASIVE CV LAB; Service: Cardiovascular; Laterality: N/A;  . Mitral valve replacement N/A 07/25/2014    Procedure:  REDO MITRAL VALVE REPLACEMENT (MVR); Surgeon: Purcell Nails, MD; Location: [facility] OR; Service: Open Heart Surgery; Laterality: N/A;   Family History  Problem Relation Age of Onset  . Lung cancer Mother   . Heart failure Maternal Grandmother   . Heart failure Maternal Grandfather    Social History:  reports that she quit smoking about 41 years ago. She has never used smokeless tobacco. She reports that she does not drink alcohol or use illicit drugs. Allergies:  Allergies  Allergen Reactions  . Augmentin [Amoxicillin-Pot Clavulanate]    Medications Prior to Admission  Medication Sig Dispense Refill  . carvedilol (COREG) 6.25 MG tablet Take 1 tablet (6.25 mg total) by mouth 2 (two) times daily with a meal. 60 tablet 11  . lisinopril (PRINIVIL,ZESTRIL) 20 MG tablet Take 1 tablet (20 mg total) by mouth daily. 30 tablet 11  . simvastatin (ZOCOR) 40 MG tablet Take 1 tablet (40 mg total) by mouth daily. 30 tablet 11  . triamterene-hydrochlorothiazide (DYAZIDE) 37.5-25 MG per capsule Take 1 each (1 capsule total) by mouth daily. 30 capsule 11  . warfarin (COUMADIN) 3 MG tablet TAKE ONE TO ONE & ONE-HALF TABLETS BY MOUTH ONCE DAILY AS DIRECTED 120 tablet 0    Home: Home Living Family/patient expects to be discharged to:: Private residence Living Arrangements: Alone Available Help at Discharge: Family, Available 24 hours/day Type of Home: Other(Comment) (townhouse) Home Access: Level entry Home Layout: One level Bathroom Shower/Tub: Tub/shower unit, Engineer, building services: Standard Home Equipment: None  Functional History: Prior Function Level of Independence: Independent Comments: retired from [employer] Functional Status:  Mobility: Bed Mobility Overal bed mobility: Needs Assistance, +2 for physical  assistance, + 2 for safety/equipment Bed Mobility: Rolling, Sit to Sidelying Rolling: Min assist Sit to sidelying: Mod assist, +2 for safety/equipment General bed mobility comments: back to bed Transfers Overall transfer level: Needs assistance Equipment used: Rolling walker (2 wheeled) Transfers: Sit to/from Stand Sit to Stand: Min assist, +2 safety/equipment General transfer comment: x3 from recliner;  Ambulation/Gait Ambulation/Gait assistance: Mod assist Ambulation Distance (Feet): 60 Feet Assistive device: Rolling walker (2 wheeled) (w/c) Gait Pattern/deviations: Step-through pattern, Decreased stride length, Drifts right/left General Gait Details: consistently drifting to her right and initially required assist to control/maneuver RW to avoid objects on her right; progressed to her able to adjust RW once PT pointed out to her the obstacle that she was running into; weak hips and torso make gait unsteady Gait velocity interpretation: Below normal speed for age/gender    ADL:    Cognition: Cognition Overall Cognitive Status: Impaired/Different from baseline Orientation Level: Oriented X4 Cognition Arousal/Alertness: Awake/alert Behavior During Therapy: WFL for tasks assessed/performed Overall Cognitive Status: Impaired/Different from baseline Area of Impairment: Orientation, Attention, Memory, Following commands, Safety/judgement, Awareness, Problem solving Orientation Level: Time Current Attention Level: Sustained Memory: Decreased recall of precautions, Decreased short-term memory Following Commands: Follows one step commands with increased time Safety/Judgement: Decreased awareness of safety, Decreased awareness of deficits Awareness: Intellectual Problem Solving: Slow processing, Difficulty sequencing, Requires verbal cues, Requires tactile cues General Comments: able to openly discuss her decr memory with daughter present and pt acknowledged these deficits; unable to  recall grandaughter's name (in room) or the year of her birthdate  Blood pressure 118/72, pulse 84, temperature 99.2 F (37.3 C), temperature source Oral, resp. rate 18, height 5\' 6"  (1.676 m), weight 73.5 kg (162 lb 0.6 oz), SpO2 100 %. Physical Exam  Constitutional: She appears well-developed.  HENT:  Head: Normocephalic.  Eyes: EOM  are normal.  Neck: Normal range of motion. Neck supple. No thyromegaly present.  Cardiovascular:  Cardiac rate controlled  Respiratory: Effort normal and breath sounds normal. No respiratory distress.  GI: Soft. Bowel sounds are normal. She exhibits no distension.  Neurological: She is alert.  Mood is flat but appropriate. She makes good eye contact with examiner. She was able to provide her age but needed some cues for dates. Fair insight and awareness. Follows simple commands. UE 4/5 prox to distal with inhibition due to pain/sternum. LE: 2+ hf, 3ke, 4/5 adf/afp. Decreased sensation in distal feet/toes.  Skin: Skin is warm and dry.  Psychiatric: She has a normal mood and affect.     Lab Results Last 24 Hours    Results for orders placed or performed during the hospital encounter of 07/21/14 (from the past 24 hour(s))  Protime-INR Status: Abnormal   Collection Time: 08/02/14 3:35 AM  Result Value Ref Range   Prothrombin Time 18.9 (H) 11.6 - 15.2 seconds   INR 1.58 (H) 0.00 - 1.49  Basic metabolic panel Status: Abnormal   Collection Time: 08/02/14 3:35 AM  Result Value Ref Range   Sodium 136 135 - 145 mmol/L   Potassium 4.1 3.5 - 5.1 mmol/L   Chloride 100 (L) 101 - 111 mmol/L   CO2 28 22 - 32 mmol/L   Glucose, Bld 110 (H) 65 - 99 mg/dL   BUN 17 6 - 20 mg/dL   Creatinine, Ser 5.28 (H) 0.44 - 1.00 mg/dL   Calcium 9.5 8.9 - 41.3 mg/dL   GFR calc non Af Amer 24 (L) >60 mL/min   GFR calc Af Amer 28 (L) >60 mL/min   Anion gap 8 5 - 15  CBC Status: Abnormal    Collection Time: 08/02/14 3:35 AM  Result Value Ref Range   WBC 11.9 (H) 4.0 - 10.5 K/uL   RBC 2.90 (L) 3.87 - 5.11 MIL/uL   Hemoglobin 8.5 (L) 12.0 - 15.0 g/dL   HCT 24.4 (L) 01.0 - 27.2 %   MCV 90.0 78.0 - 100.0 fL   MCH 29.3 26.0 - 34.0 pg   MCHC 32.6 30.0 - 36.0 g/dL   RDW 53.6 64.4 - 03.4 %   Platelets 152 150 - 400 K/uL      Imaging Results (Last 48 hours)    Dg Chest 2 View  08/02/2014 CLINICAL DATA: Follow-up atelectasis, status post mitral valve replacement on July 25, 2014. EXAM: CHEST 2 VIEW COMPARISON: PA and lateral chest x-ray of July 30, 2014. FINDINGS: The lungs are reasonably well inflated. There is no pneumothorax. There are stable bilateral pleural effusions. The pulmonary interstitial markings remain increased diffusely. There is stable mild bibasilar subsegmental atelectasis. The cardiac silhouette is enlarged. The central pulmonary vascularity is less prominent today. There are 8 intact sternal wires. The prosthetic mitral valve ring is visible. The bony thorax exhibits no acute abnormality. IMPRESSION: Persistent bilateral pleural effusions, bibasilar atelectasis, and mild pulmonary interstitial edema. The pulmonary vascular engorgement has decreased slightly. Electronically Signed By: David Swaziland M.D. On: 08/02/2014 08:19     Assessment/Plan: Diagnosis: debility after recent cardiac arrest/sepsis, subsequent MV re-do 1. Does the need for close, 24 hr/day medical supervision in concert with the patient's rehab needs make it unreasonable for this patient to be served in a less intensive setting? Yes 2. Co-Morbidities requiring supervision/potential complications: htn, MVR,  3. Due to bladder management, bowel management, safety, skin/wound care, disease management, medication administration, pain management and patient education, does  the patient require 24 hr/day rehab nursing? Yes 4. Does the patient require  coordinated care of a physician, rehab nurse, PT (1-2 hrs/day, 5 days/week) and OT (1-2 hrs/day, 5 days/week) to address physical and functional deficits in the context of the above medical diagnosis(es)? Yes Addressing deficits in the following areas: balance, endurance, locomotion, strength, transferring, bowel/bladder control, bathing, dressing, feeding, grooming, toileting and psychosocial support 5. Can the patient actively participate in an intensive therapy program of at least 3 hrs of therapy per day at least 5 days per week? Yes 6. The potential for patient to make measurable gains while on inpatient rehab is excellent 7. Anticipated functional outcomes upon discharge from inpatient rehab are modified independent with PT, modified independent with OT, n/a with SLP. 8. Estimated rehab length of stay to reach the above functional goals is: 7 days 9. Does the patient have adequate social supports and living environment to accommodate these discharge functional goals? Yes 10. Anticipated D/C setting: Home 11. Anticipated post D/C treatments: HH therapy 12. Overall Rehab/Functional Prognosis: excellent  RECOMMENDATIONS: This patient's condition is appropriate for continued rehabilitative care in the following setting: CIR Patient has agreed to participate in recommended program. Yes Note that insurance prior authorization may be required for reimbursement for recommended care.  Comment: Rehab Admissions Coordinator to follow up.  Thanks,  Ranelle Oyster, MD, [place] [facility]     08/02/2014       Revision History     Date/Time User Provider Type Action   08/02/2014 2:31 PM Ranelle Oyster, MD Physician Sign   08/02/2014 11:47 AM Charlton Amor, PA-C Physician Assistant Pend   View Details Report       Routing History     Date/Time From To Method   08/02/2014 2:31 PM Ranelle Oyster, MD Ranelle Oyster, MD In Basket   08/02/2014 2:31 PM Ranelle Oyster, MD No Pcp Per  Patient In Basket

## 2014-08-04 NOTE — Progress Notes (Signed)
Patient had several loose stools during shift. Patient and daughter, [name], voiced concerns about this, stating it was new, and if could be from medications or if it were something more.  Educated on side effects of current meds and continuing to monitor patient by nursing staff and MD.

## 2014-08-04 NOTE — Evaluation (Signed)
Physical Therapy Assessment and Plan  Patient Details  Name: [name] MRN: [id number] Date of Birth: [birth year]  PT Diagnosis: Abnormality of gait, Cognitive deficits, Coordination disorder, Difficulty walking, Edema, Impaired cognition and Muscle weakness Rehab Potential: Good ELOS: 7-10 days   Today's Date: 08/04/2014 PT Individual Time: 1300-1400 PT Individual Time Calculation (min): 60 min    Problem List:  Patient Active Problem List   Diagnosis Date Noted  . Debility 08/04/2014  . Anoxic encephalopathy 08/04/2014  . Diastolic CHF 20/25/4270  . 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 S/P redo mitral valve replacement with bioprosthetic valve 07/25/2014  . Prosthetic valve dysfunction 07/22/2014  . Shock 07/21/2014  . Cardiac arrest 07/21/2014  . Acute respiratory failure, unspecified whether with hypoxia or hypercapnia   . Metabolic acidosis   . RBBB 03/11/2014  . HTN (hypertension) 01/23/2013  . Dyslipidemia 01/23/2013  . S/P mitral valve replacement with a bileaflet mechanical valve 04/11/1998  . Long term current use of anticoagulant therapy for mechanical mitral valve 04/11/1998    Past Medical History:  Past Medical History  Diagnosis Date  . Hypertension   . Hyperlipidemia   . S/P MVR (mitral valve replacement) 04/11/1998    #29 St[employer] Jude bileaflet mechanical valve for bacterial endocarditis  . 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 S/P redo mitral valve replacement with bioprosthetic valve 07/25/2014    27 mm [employer] Mitral bovine bioprosthetic tissue valve   Past Surgical History:  Past Surgical History  Procedure Laterality Date  . Mitral valve replacement  04/11/1998    St. Jude mechanical MVR (severe MR, episode of  streptococcal bacterial endocarditis - Dr. Roxy Manns  . Tubal ligation    . Transthoracic echocardiogram  11/2009    EF=>55%; bi-leaflet St. Jude mech prosthesis; mild TR, RVSP elevated at 40-5mHg, mod pulm HTN; trace AV regurg; mild pulm valve regurg  . Total abdominal hysterectomy w/ bilateral salpingoophorectomy  12/14/1998  . Cardiac catheterization N/A 07/24/2014    Procedure: Right/Left Heart Cath and Coronary Angiography;  Surgeon: MWellington Hampshire MD;  [facility];  Service: Cardiovascular;  Laterality: N/A;  . Cardiac catheterization N/A 07/22/2014    Procedure: Fluoroscopy Guidance;  Surgeon: TTroy Sine MD;  [facility];  Service: Cardiovascular;  Laterality: N/A;  . Mitral valve replacement N/A 07/25/2014    Procedure: REDO MITRAL VALVE REPLACEMENT (MVR);  Surgeon: CRexene Alberts MD;  [facility]  Service: Open Heart Surgery;  Laterality: N/A;    Assessment & Plan Clinical Impression: Margaret EOzella Rocksis a 69y.o. right handed female with history of hypertension, remote history of endocarditis with destruction of mitral valve requiring St. Jude mechanical valve placement by Dr. ORoxy Mannsin April 2000 maintained on chronic Coumadin. Independent prior to admission living alone. Presented 07/21/2014 with altered mental status and malaise and witnessed PEA arrest in the ED with bradycardia down to the 20s. Patient was severely diaphoretic on arrival. Noted creatinine 3.57. Troponin 0.95. EKG showed right bundle branch block and left anterior fascicular block consistent with previous EKG from 03/11/2014..Marland KitchenRenal ultrasound with no hydronephrosis. Cardiology as well as critical care medicine follow-up for septic shock and cardiac arrest. Patient required full intubation. TEE completed showing ejection fraction of 55% motions of  the leaflets of the mechanical valve in the mitral position appeared restricted. Underwent redo mitral valve replacement 07/25/2014 per  Dr. Roxy Manns. Hospital course pain management. Renal function slowly improving creatinine 2.08 with follow-up per renal services felt that renal insufficiency related to acute decompensated CHF related to cardiogenic shock. Chronic Coumadin ongoing. Acute blood loss anemia 8.5 and monitored. Patient was extubated 07/26/2014. Tolerating a regular diet. Physical therapy evaluation completed and ongoing noting debilitation as well as some difficulty with recall of events as well as memory deficits with recommendations of physical medicine rehabilitation consult.  Patient transferred to CIR on 08/03/2014 .   Patient currently requires mod with mobility secondary to muscle weakness, decreased cardiorespiratoy endurance, ataxia, decreased coordination and decreased motor planning, ideational apraxia, decreased awareness, decreased problem solving, decreased safety awareness, decreased memory and delayed processing and decreased sitting balance, decreased standing balance, decreased balance strategies and difficulty maintaining precautions.  Prior to hospitalization, patient was independent  with mobility and lived with Alone in a House (condo/townhouse) home.  Home access is  Level entry.  Patient will benefit from skilled PT intervention to maximize safe functional mobility, minimize fall risk and decrease caregiver burden for planned discharge home with 24 hour supervision.  Anticipate patient will benefit from cardiac rehab at discharge.  PT - End of Session Activity Tolerance: Tolerates < 10 min activity with changes in vital signs Endurance Deficit: Yes Endurance Deficit Description: cardiorespiratory PT Assessment Rehab Potential (ACUTE/IP ONLY): Good PT Patient demonstrates impairments in the following area(s): Balance;Skin Integrity;Behavior;Edema;Endurance;Motor;Pain;Perception;Safety PT Transfers Functional Problem(s): Bed Mobility;Bed to Chair;Car;Furniture PT Locomotion Functional Problem(s):  Ambulation;Wheelchair Mobility;Stairs PT Plan PT Intensity: Minimum of 1-2 x/day ,45 to 90 minutes PT Frequency: 5 out of 7 days PT Duration Estimated Length of Stay: 7-10 days PT Treatment/Interventions: Ambulation/gait training;Discharge planning;Functional mobility training;Psychosocial support;Therapeutic Activities;Visual/perceptual remediation/compensation;Balance/vestibular training;Disease management/prevention;Neuromuscular re-education;Skin care/wound management;Therapeutic Exercise;Wheelchair propulsion/positioning;Cognitive remediation/compensation;DME/adaptive equipment instruction;Pain management;Splinting/orthotics;UE/LE Strength taining/ROM;Community reintegration;Patient/family education;Stair training;UE/LE Coordination activities PT Transfers Anticipated Outcome(s): Supervision PT Locomotion Anticipated Outcome(s): Supervision PT Recommendation Follow Up Recommendations: 24 hour supervision/assistance;Other (comment) (Cardiac Rehab) Patient destination: Home Equipment Recommended: To be determined  Skilled Therapeutic Intervention PT Evaluation: Pt presents with significant cognitive deficits after mitral valve revision req a bovine valve, as well as impairment in all aspects of functional mobility. Pt educated re: sternal precautions, but is unable to recall these when moving or when asked/quizzed. Pt has low initiation and poor safety awareness, believing she is in the hospital because of a stroke. Pt's arms are functional within sternal precautions (arms flex to 90 degrees, but otherwise no MMT except grip 5/5 bilaterally). PT notes pt demonstrates R arm ataxia with impaired proprioception in eyes closed finger to nose test. Pt's activity tolerance is extremely low and vitals monitored closely with frequent rest breaks throughout PT evaluation. Pt will benefit from IPR PT.   Gait Training: PT instructs pt in ambulation without AD x 20' while pt holds heart pillow - pt very  apprehensive and req cues not to hold her breath, unsteady gait noted.  PT instructs pt in ascending/descending a single 6" high step without armrails req mod A for balance and boost, as well as verbal cues for technique.   W/C Management: PT instructs pt in w/c propulsion with B LEs x 150' req occasional min A for initiation of w/c propulsion. Pt req assist for legrest management, but is able to lock/unlock brakes with cues.   Therapeutic Activity: Pt received in bed, agreeable to PT evaluation. Pt requests to use  the bathroom and req min A for supine to sit transfer from flat bed without rails - PT gave pt heart pillow to hold and instructed her in sternal precautions related to functional mobility. Pt req min A for sit to stand and transfers bed to w/c, w/c to toilet, toilet to w/c, and w/c to/from mat. Pt completes anterior hygiene with cues, but req assist to don/doff pants for urinating.  Continue per PT POC.    PT Evaluation Precautions/Restrictions Precautions Precautions: Sternal;Fall Restrictions Weight Bearing Restrictions: No General Chart Reviewed: Yes Family/Caregiver Present: No  Vital Signs See Flow Shet Pain Pain Assessment Pain Assessment: No/denies pain Pain Score: 0-No pain Home Living/Prior Functioning Home Living Available Help at Discharge: Family;Available 24 hours/day (daughter Vonna Kotyk) Type of Home: House (condo/townhouse) Home Access: Level entry Home Layout: One level Bathroom Shower/Tub: Tub/shower unit;Curtain Biochemist, clinical: Standard Bathroom Accessibility: Yes  Lives With: Alone Prior Function Level of Independence: Independent with gait;Independent with transfers  Able to Take Stairs?: Reciprically Driving: Yes Vocation: Retired Comments: retired from [employer] - Assessment Additional Comments: Pt reports seeing spots/balls in line of vision, but able to read names on white board Praxis Praxis-Other Comments:  unable to recognize w/c or calendar for its use; poor sequencing  Cognition Overall Cognitive Status: Impaired/Different from baseline Arousal/Alertness: Awake/alert Orientation Level: Oriented to person;Oriented to place;Disoriented to time;Disoriented to situation Attention: Focused Focused Attention: Appears intact Memory: Impaired (0/4 words recalled) Memory Impairment: Storage deficit;Retrieval deficit;Decreased recall of new information Awareness: Impaired Awareness Impairment: Intellectual impairment;Emergent impairment;Anticipatory impairment Problem Solving: Impaired Problem Solving Impairment: Functional basic Safety/Judgment: Impaired Sensation Sensation Light Touch: Appears Intact Stereognosis: Not tested Hot/Cold: Not tested Proprioception: Impaired Detail Proprioception Impaired Details: Impaired RUE Additional Comments: eyes closed finger to nose test impaired on R side Coordination Fine Motor Movements are Fluid and Coordinated: Not tested Finger Nose Finger Test: impaired end point and ataxia on R side  Heel Shin Test: intact bilaterally Motor  Motor Motor: Within Functional Limits  Mobility Bed Mobility Bed Mobility: Supine to Sit Rolling Right: 5: Supervision Rolling Right Details: Verbal cues for precautions/safety Right Sidelying to Sit: 4: Min guard Supine to Sit: 4: Min assist Supine to Sit Details: Verbal cues for precautions/safety;Manual facilitation for placement Sitting - Scoot to Edge of Bed: 5: Supervision Sitting - Scoot to [employer] of Bed Details: Verbal cues for precautions/safety Transfers Transfers: Yes Sit to Stand: 4: Min assist;Without upper extremity assist;From bed Sit to Stand Details: Manual facilitation for placement Stand to Sit: 4: Min guard;Without upper extremity assist Stand Pivot Transfers: 4: Min assist Stand Pivot Transfer Details: Manual facilitation for placement;Verbal cues for precautions/safety Locomotion   Ambulation Ambulation: Yes Ambulation/Gait Assistance: 4: Min assist Ambulation Distance (Feet): 20 Feet Assistive device: None Ambulation/Gait Assistance Details: Manual facilitation for placement Ambulation/Gait Assistance Details: unsteady gait - pt holds heart pillow - apprehensive about poor balance Gait Gait: Yes Gait Pattern: Impaired Gait Pattern: Step-through pattern (variable BOS) Gait velocity: decreased Stairs / Additional Locomotion Stairs: Yes Stairs Assistance: 3: Mod assist Stairs Assistance Details: Manual facilitation for placement;Verbal cues for technique Stair Management Technique: No rails Number of Stairs: 1 Height of Stairs: 6 Wheelchair Mobility Wheelchair Mobility: Yes Wheelchair Assistance: 4: Advertising account executive Details: Multimedia programmer for Horticulturist, commercial: Both lower extermities Wheelchair Parts Management: Needs assistance Distance: 150  Trunk/Postural Assessment  Cervical Assessment Cervical Assessment: Within Functional Limits Thoracic Assessment Thoracic Assessment: Within Functional Limits Lumbar Assessment Lumbar Assessment: Within Functional Limits  Postural Control Postural Control: Within Functional Limits  Balance Balance Balance Assessed: Yes Static Sitting Balance Static Sitting - Balance Support: Feet supported Static Sitting - Level of Assistance: 5: Stand by assistance Dynamic Sitting Balance Dynamic Sitting - Balance Support: Feet supported Dynamic Sitting - Level of Assistance: 4: Min assist Static Standing Balance Static Standing - Balance Support: During functional activity;No upper extremity supported Static Standing - Level of Assistance: 4: Min assist Dynamic Standing Balance Dynamic Standing - Balance Support: No upper extremity supported;During functional activity Dynamic Standing - Level of Assistance: 4: Min assist Extremity Assessment  RUE Assessment RUE Assessment: Exceptions  to [facility] RUE AROM (degrees) Overall AROM Right Upper Extremity: Deficits;Due to precautions RUE Overall AROM Comments: arm flexion tested to 90 degrees; elbows & grip wfl RUE Strength RUE Overall Strength: Deficits;Due to precautions RUE Overall Strength Comments: grip 5/5; elbow >= 3/5; arm flexion >= 2+/5 LUE Assessment LUE Assessment: Exceptions to WFL LUE AROM (degrees) Overall AROM Left Upper Extremity: Deficits;Due to precautions LUE Overall AROM Comments: armflexion to 90 degrees; elbow & grip wfl LUE Strength LUE Overall Strength: Deficits;Due to precautions LUE Overall Strength Comments: grip 5/5; elbow >= 3/5; arm flexion >= 2+/5 RLE Assessment RLE Assessment: Exceptions to [facility] RLE AROM (degrees) Overall AROM Right Lower Extremity: Within functional limits for tasks assessed RLE Strength RLE Overall Strength: Deficits;Due to premorbid status RLE Overall Strength Comments: hip flexion 4+/5; otherwise 5/5 LLE Assessment LLE Assessment: Exceptions to WFL LLE AROM (degrees) Overall AROM Left Lower Extremity: Within functional limits for tasks assessed LLE Strength LLE Overall Strength: Deficits;Due to premorbid status LLE Overall Strength Comments: hip flexion 4+/5; otherwise 5/5  FIM:  FIM - Locomotion: Wheelchair Distance: 150 FIM - Locomotion: Ambulation Ambulation/Gait Assistance: 4: Min assist   Refer to Care Plan for Long Term Goals  Recommendations for other services: None  Discharge Criteria: Patient will be discharged from PT if patient refuses treatment 3 consecutive times without medical reason, if treatment goals not met, if there is a change in medical status, if patient makes no progress towards goals or if patient is discharged from hospital.  The above assessment, treatment plan, treatment alternatives and goals were discussed and mutually agreed upon: by patient  [facility] M 08/04/2014, 2:53 PM

## 2014-08-05 ENCOUNTER — Inpatient Hospital Stay (HOSPITAL_COMMUNITY): Payer: Self-pay | Admitting: Occupational Therapy

## 2014-08-05 ENCOUNTER — Inpatient Hospital Stay (HOSPITAL_COMMUNITY): Payer: Medicare Other | Admitting: Physical Therapy

## 2014-08-05 ENCOUNTER — Inpatient Hospital Stay (HOSPITAL_COMMUNITY): Payer: Medicare Other | Admitting: Speech Pathology

## 2014-08-05 DIAGNOSIS — Z954 Presence of other heart-valve replacement: Secondary | ICD-10-CM

## 2014-08-05 LAB — BASIC METABOLIC PANEL
Anion gap: 7 (ref 5–15)
BUN: 17 mg/dL (ref 6–20)
CALCIUM: 9.9 mg/dL (ref 8.9–10.3)
CO2: 26 mmol/L (ref 22–32)
Chloride: 104 mmol/L (ref 101–111)
Creatinine, Ser: 2.26 mg/dL — ABNORMAL HIGH (ref 0.44–1.00)
GFR, EST AFRICAN AMERICAN: 25 mL/min — AB (ref >60)
GFR, EST NON AFRICAN AMERICAN: 21 mL/min — AB (ref >60)
GLUCOSE: 96 mg/dL (ref 65–99)
Potassium: 3.7 mmol/L (ref 3.5–5.1)
SODIUM: 137 mmol/L (ref 135–145)

## 2014-08-05 LAB — PROTIME-INR
INR: 1.96 — AB (ref 0.00–1.49)
PROTHROMBIN TIME: 22.2 s — AB (ref 11.6–15.2)

## 2014-08-05 MED ORDER — WARFARIN SODIUM 3 MG PO TABS
3.0000 mg | ORAL_TABLET | Freq: Once | ORAL | Status: AC
  Administered 2014-08-05: 3 mg via ORAL
  Filled 2014-08-05 (×2): qty 1

## 2014-08-05 NOTE — Progress Notes (Signed)
Physical Therapy Session Note  Patient Details  Name: [name] MRN: [id number] Date of Birth: 1945-06-09  Today's Date: 08/05/2014 PT Individual Time:  -  1100-1200 Treatment Session 2: 1600-1630 Treatment Time: 60 min Treatment Session 2: 30 min     Short Term Goals: Week 1:  PT Short Term Goal 1 (Week 1): STGs = LTGs due to ELOS  Skilled Therapeutic Interventions/Progress Updates:    Treatment Session 1: Therapeutic Activity: Pt received sitting in toilet, requesting to use bathroom prior to walking to the gym, c/o fatigue. PT ambulates pt 5' into bathroom without AD req CGA (min A sit to stand), steady assist for toilet transfer - pt doffs clothes herself, but req assist for hygiene. Pt then c/o "eyes getting dim" 2nd assist quickly enters, PT assists pt to stand with min A while 2nd assist pulls up underwear/pants, and pt stand-pivots transfer to w/c req min A. RN enters, vitals taken (all stable) and after interview, pt reports she was straining on the toilet (but no BM produced). PT and RN suspect a vasovagal response from valsalva maneuver. PT instructs pt in w/c to bed transfer while holding heart pillow req min A, and min A sit to supine transfer.   Therapeutic Exercise: Remainder of PT session consists of bed level exercises. PT instructs pt in B LE ROM and strengthening exercises: ankle pumps, heel slides, supine hip abduction/adduction (one set only), hip ER/IR in B hook lie, SLR (one set only), calm shells, side lie hip abduction, bridges: 2 x 10 reps each leg PT instructs pt in incentive spriometry: x 10 reps with 1,000 mL on best attempt  Pt very fatigued this AM session - requests to use bathroom at end of session and pt handed off to CNA.   Treatment Session 2: Gait Training: PT instructs pt in ambulation 70' x 2 reps req min A for balance - bigger step length than previous day and improved endurance.  PT instructs pt in ascending/descending 4 (6" height  steps) with B handrails for balance only (PT instructs pt specifically no pushing or pulling on rails) req min A for safety in step-to pattern.   W/C Management: PT instructs pt in w/c propulsion with B UEs x 120' req occasional min a to get started, then SBA otherwise.   Pt has improved functional mobility this PM, but continues to c/o fatigue and low activity tolerance. Continue per PT POC.    Therapy Documentation Precautions:  Precautions Precautions: Sternal, Fall Restrictions Weight Bearing Restrictions: No Vital Signs: AM Session Therapy Vitals Pulse Rate: (!) 58 BP: (!) 98/57 mmHg Patient Position (if appropriate): Sitting Oxygen Therapy SpO2: 100 % O2 Device: Not Delivered Pain: Pain Assessment Pain Assessment: No/denies pain Treatment Session 2: Pt denies pain  See FIM for current functional status  Therapy/Group: Individual Therapy  Kharon Hixon M 08/05/2014, 8:46 AM

## 2014-08-05 NOTE — Progress Notes (Signed)
Called to room after pt reported cloudy vision on toilet; assisted to w/c by therapy and seated,Pt noted she had strained while on toilet some before felt different. Discussed not to strain 2/2 sternal precatuions and has order for laxative to keep stools regular. BP WNL and pulse ok. PT able to continue therapy session and PAC notified of event, No new orders received. Pamelia Hoit

## 2014-08-05 NOTE — Progress Notes (Signed)
[facility] PHYSICAL MEDICINE & REHABILITATION     PROGRESS NOTE    Subjective/Complaints: Numerous loose stools overnight. Received dulcolax and colace on 7/27. No fever, chill, belly pain. Otherwise doing well.   ROS: Pt denies fever, rash/itching, headache, blurred or double vision, nausea, vomiting, abdominal pain, chest pain, shortness of breath, palpitations, dysuria, dizziness, neck or back pain, bleeding, anxiety, or depression   Objective: Vital Signs: Blood pressure 96/65, pulse 72, temperature 99.4 F (37.4 C), temperature source Oral, resp. rate 18, weight 73.211 kg (161 lb 6.4 oz), SpO2 99 %. No results found.  Recent Labs  08/04/14 0610  WBC 10.7*  HGB 8.5*  HCT 25.8*  PLT 191    Recent Labs  08/03/14 0413 08/04/14 0610  NA 136 136  K 4.0 4.2  CL 102 103  GLUCOSE 110* 101*  BUN 16 18  CREATININE 2.08* 2.18*  CALCIUM 10.1 10.1   CBG (last 3)  No results for input(s): GLUCAP in the last 72 hours.  Wt Readings from Last 3 Encounters:  08/05/14 73.211 kg (161 lb 6.4 oz)  08/03/14 71.714 kg (158 lb 1.6 oz)  03/11/14 68.72 kg (151 lb 8 oz)    Physical Exam:  Constitutional: She appears well-developed.  HENT: oral mucosa pink and moist Head: Normocephalic.  Eyes: EOM are normal.  Neck: Normal range of motion. Neck supple. No thyromegaly present.  Cardiovascular:  Cardiac rate controlled. No murmurs.no LE edema.  Respiratory: Effort normal and breath sounds normal. No respiratory distress.  GI: Soft. Bowel sounds are normal. She exhibits no distension.  Neurological: She is alert.  Mood is flat but appropriate. She makes good eye contact with examiner. She was able to provide her age but needed some cues for dates. Fair insight and awareness.  Needs cues for higher level tasks and processes as well as language. Follows simple commands. UE 4/5 prox to distal with inhibition due to pain/sternum. LE: 2+ hf, 3ke, 4/5 adf/afp. Decreased sensation in  distal feet/toes.  Skin: Skin is warm and dry. Wound incision healing nicely along chest, drain/pacer lead site with steristrips /dried blood Psychiatric: She has a normal mood and affect. pleasant   Assessment/Plan: 1. Functional deficits secondary to debility/anoxic encephalopathy which require 3+ hours per day of interdisciplinary therapy in a comprehensive inpatient rehab setting. Physiatrist is providing close team supervision and 24 hour management of active medical problems listed below. Physiatrist and rehab team continue to assess barriers to discharge/monitor patient progress toward functional and medical goals. FIM: FIM - Bathing Bathing Steps Patient Completed: Chest, Abdomen, Front perineal area, Buttocks Bathing: 2: Max-Patient completes 3-4 33f 10 parts or 25-49%  FIM - Upper Body Dressing/Undressing Upper body dressing/undressing steps patient completed: Thread/unthread right bra strap, Thread/unthread left bra strap, Thread/unthread right sleeve of pullover shirt/dresss, Thread/unthread left sleeve of pullover shirt/dress, Put head through opening of pull over shirt/dress Upper body dressing/undressing: 4: Min-Patient completed 75 plus % of tasks FIM - Lower Body Dressing/Undressing Lower body dressing/undressing: 2: Max-Patient completed 25-49% of tasks  FIM - Toileting Toileting steps completed by patient: Adjust clothing prior to toileting, Performs perineal hygiene Toileting Assistive Devices: Grab bar or rail for support Toileting: 3: Mod-Patient completed 2 of 3 steps  FIM - Diplomatic Services operational officer Devices: Environmental consultant, Therapist, music Transfers: 4-To toilet/BSC: Min A (steadying Pt. > 75%), 4-From toilet/BSC: Min A (steadying Pt. > 75%)  FIM - Bed/Chair Transfer Bed/Chair Transfer Assistive Devices: HOB elevated Bed/Chair Transfer: 4: Supine > Sit:  Min A (steadying Pt. > 75%/lift 1 leg), 4: Bed > Chair or W/C: Min A (steadying Pt. > 75%), 4:  Chair or W/C > Bed: Min A (steadying Pt. > 75%)  FIM - Locomotion: Wheelchair Distance: 150 Locomotion: Wheelchair: 4: Travels 150 ft or more: maneuvers on rugs and over door sillls with minimal assistance (Pt.>75%) FIM - Locomotion: Ambulation Locomotion: Ambulation Assistive Devices: Other (comment) (no AD) Ambulation/Gait Assistance: 4: Min assist Locomotion: Ambulation: 1: Travels less than 50 ft with minimal assistance (Pt.>75%)  Comprehension Comprehension Mode: Auditory Comprehension: 3-Understands basic 50 - 74% of the time/requires cueing 25 - 50%  of the time  Expression Expression Mode: Verbal Expression: 3-Expresses basic 50 - 74% of the time/requires cueing 25 - 50% of the time. Needs to repeat parts of sentences.  Social Interaction Social Interaction: 4-Interacts appropriately 75 - 89% of the time - Needs redirection for appropriate language or to initiate interaction.  Problem Solving Problem Solving: 2-Solves basic 25 - 49% of the time - needs direction more than half the time to initiate, plan or complete simple activities  Memory Memory: 2-Recognizes or recalls 25 - 49% of the time/requires cueing 51 - 75% of the time  Medical Problem List and Plan: 1. Functional deficits secondary to debilitation and probable anoxic encephalopathy after cardiac arrest/sepsis, subsequent mitral valve redo  2. DVT Prophylaxis/Anticoagulation: Chronic Coumadin therapy. INR therapeutic today 3. Pain Management: Ultram as needed. Monitor pain needs with increased mobility 4. Acute blood loss anemia. hgb 8.5 . Continue serial follow up, check orthostatic vitals 5. Neuropsych: This patient is capable of making decisions on her own behalf. 6. Skin/Wound Care: Routine skin checks 7. Fluids/Electrolytes/Nutrition: electrolytes normal. 8. Hypertension/atrial fibrillation. Amiodarone 200 mg twice a day, Coreg 6.25 mg twice a day, . Good control at present, HR  72-95 bpm 9. Diastolic  congestive heart failure. Continue Lasix 40 mg twice daily. Monitor for any signs of fluid overload -following weights and i's and o's, weight 72.8kg today 9. Acute on chronic renal insufficiency. Baseline creatinine 1.48---- now around creatinine 2.06 over last few lab checks. . Renal ultrasound negative. No specific intervention at this time. 10. Hyperlipidemia. Lipitor 11. Diarrhea: likely laxative induced.Improving cont soft stool this am none overnite  -wbc's decreased  -negative c diff on 7/13  -imodium, probiotic  -check BMET for K  LOS (Days) 2 A FACE TO FACE EVALUATION WAS PERFORMED  Trezure Cronk E 08/05/2014 7:25 AM

## 2014-08-05 NOTE — Progress Notes (Signed)
Speech Language Pathology Daily Session Note  Patient Details  Name: [name] MRN: [id number] Date of Birth: [birth year]  Today's Date: 08/05/2014 SLP Individual Time: 0832-0930 SLP Individual Time Calculation (min): 58 min  Short Term Goals: Week 1: SLP Short Term Goal 1 (Week 1): Patient will utilize memory compensatory strategies to recall new, daily information with Mod A multimodal cues.  SLP Short Term Goal 2 (Week 1): Patient will utilize call bell to request assistance in 50% of opportunities with Mod A multimodal cues.  SLP Short Term Goal 3 (Week 1): Patient will complete functional and familiar tasks safely in regards to problem solving with Mod A multimodal cues.  SLP Short Term Goal 4 (Week 1): Patient will demonstrate selective attention in a mildly distracting enviornment for 30 minutes with Min A verbal cues for redirection.  SLP Short Term Goal 5 (Week 1): Patient will utilize external aids for orientaiton with supervision verbal cues.  SLP Short Term Goal 6 (Week 1): Patient will self-monitor and correct errors with verbal and functional tasks with Mod A multimodal cues.   Skilled Therapeutic Interventions: Skilled treatment session focused on addressing cognitive-linguistic goals. SLP facilitated session by providing Max verbal and visual cues to functionally problem solve self-care during a toileting task.  Patient was able to verbally sequence task; however, she required consistent cues to functionally initiate each step.  SLP also facilitated session with 4 step picture sequencing cards; following SLP placing first card patient was then able to sequence the following 3 with Mod cues to attend to details.  Patient also required Mod cues to name specific items while verbally sequence pictured steps.  Patient frequently gave vague descriptions due to word finding difficulty and required initial phonemic cues for accuracy.  Goals were modified to include awareness of  and ability to correct previously described verbal errors.     FIM:  Comprehension Comprehension Mode: Auditory Comprehension: 3-Understands basic 50 - 74% of the time/requires cueing 25 - 50%  of the time Expression Expression Mode: Verbal Expression: 3-Expresses basic 50 - 74% of the time/requires cueing 25 - 50% of the time. Needs to repeat parts of sentences. Social Interaction Social Interaction: 4-Interacts appropriately 75 - 89% of the time - Needs redirection for appropriate language or to initiate interaction. Problem Solving Problem Solving: 2-Solves basic 25 - 49% of the time - needs direction more than half the time to initiate, plan or complete simple activities Memory Memory: 2-Recognizes or recalls 25 - 49% of the time/requires cueing 51 - 75% of the time  Pain Pain Assessment Pain Assessment: No/denies pain  Therapy/Group: Individual Therapy  Charlane Ferretti., CCC-SLP [phone]  Chandell Attridge 08/05/2014, 9:33 AM

## 2014-08-05 NOTE — Progress Notes (Signed)
Occupational Therapy Session Note  Patient Details  Name: [name] MRN: [id number] Date of Birth: 1945/06/28  Today's Date: 08/05/2014 OT Individual Time: 1000-1100 OT Individual Time Calculation (min): 60 min    Short Term Goals: Week 1:  OT Short Term Goal 1 (Week 1): STG=LTG d/t anticipated short LOS  Skilled Therapeutic Interventions/Progress Updates:    Pt seen for OT session focusing on ADL re-training, functional mobility, and cognitive re-training. Pt in w/c upon arrival, voicing fatigue from previous sessions, however, agreeable to tx session. Pt oriented to person and situation, however, reported the year as being "1927".  Pt bathed seated in w/c in front of the sink. Questioning cues provided throughout bathing and dressing task for sequencing and to complete tasks in their entirity. She completed UB and LB dressing/ bathing with supervision, able to orient clothing correctly. Pt voiced need to complete toileting task. She ambulated into bathroom with steadying assist, requiring VCs to navigate environment as pt paid no attention to environmental barriers when walking. Pt completed toileting task with supervision, completing pericare hygiene via anterior approach from seated position. Pt then ambulated throughout unit with min A using no AD. Pt with 1 LOB episode, requiring mod A to regain balance, as demonstrated decreased balance in high stimulating environment when alternating attention. In therapy gym, pt required to sort bean bags with food items on them, required to divide by food groups. Pt able to complete with min questioning cues with ~95% accuracy. Pt ambulated back to room at end of session, all needs in reach, left sitting in w/c with QRB on awaiting PT session.  Pt with noted bleeding in abdomen wound, RN made aware and bandage applied.   Therapy Documentation Precautions:  Precautions Precautions: Sternal, Fall Restrictions Weight Bearing Restrictions:  No Pain: Pain Assessment Pain Assessment: No/denies pain ADL: ADL ADL Comments: see FIM  See FIM for current functional status  Therapy/Group: Individual Therapy  Lewis, Venisa Frampton C 08/05/2014, 7:12 AM

## 2014-08-05 NOTE — Progress Notes (Signed)
ANTICOAGULATION CONSULT NOTE - Follow Up Consult  Pharmacy Consult for coumadin Indication: heart valve  Allergies  Allergen Reactions  . Augmentin [Amoxicillin-Pot Clavulanate]     Patient Measurements: Weight: 161 lb 6.4 oz (73.211 kg) Heparin Dosing Weight:   Vital Signs: Temp: 99.4 F (37.4 C) (07/29 0552) Temp Source: Oral (07/29 0552) BP: 96/65 mmHg (07/29 0552) Pulse Rate: 72 (07/29 0552)  Labs:  Recent Labs  08/03/14 0413 08/04/14 0610 08/05/14 0515  HGB  --  8.5*  --   HCT  --  25.8*  --   PLT  --  191  --   LABPROT 19.3* 24.1* 22.2*  INR 1.63* 2.18* 1.96*  CREATININE 2.08* 2.18*  --     Estimated Creatinine Clearance: 26 mL/min (by C-G formula based on Cr of 2.18).   Medications:  Scheduled:  . amiodarone  200 mg Oral BID PC  . antiseptic oral rinse  7 mL Mouth Rinse BID  . aspirin EC  81 mg Oral Daily  . atorvastatin  20 mg Oral q1800  . bisacodyl  10 mg Oral Daily   Or  . bisacodyl  10 mg Rectal Daily  . carvedilol  6.25 mg Oral BID WC  . furosemide  40 mg Oral BID  . pantoprazole  40 mg Oral Daily  . potassium chloride  20 mEq Oral BID  . saccharomyces boulardii  250 mg Oral BID  . Warfarin - Pharmacist Dosing Inpatient   Does not apply q1800   Infusions:    Assessment: 69 yo female with heart valve is currently on subtherapeutic coumadin.  INR today is down to 1.96 from 2.18.  Off cipro but on amidoarone.  Goal of Therapy:  INR 2.5-3.5 Monitor platelets by anticoagulation protocol: Yes   Plan:  - coumadin 3 mg po x1 - INR in am  Brant Peets, Tsz-Yin 08/05/2014,8:16 AM

## 2014-08-06 ENCOUNTER — Inpatient Hospital Stay (HOSPITAL_COMMUNITY): Payer: Medicare Other | Admitting: Occupational Therapy

## 2014-08-06 ENCOUNTER — Inpatient Hospital Stay (HOSPITAL_COMMUNITY): Payer: Self-pay | Admitting: Speech Pathology

## 2014-08-06 ENCOUNTER — Inpatient Hospital Stay (HOSPITAL_COMMUNITY): Payer: Medicare Other | Admitting: Physical Therapy

## 2014-08-06 LAB — PROTIME-INR
INR: 2.11 — ABNORMAL HIGH (ref 0.00–1.49)
Prothrombin Time: 23.5 seconds — ABNORMAL HIGH (ref 11.6–15.2)

## 2014-08-06 MED ORDER — WARFARIN SODIUM 3 MG PO TABS
3.0000 mg | ORAL_TABLET | Freq: Once | ORAL | Status: AC
  Administered 2014-08-06: 3 mg via ORAL
  Filled 2014-08-06: qty 1

## 2014-08-06 NOTE — Progress Notes (Signed)
ANTICOAGULATION CONSULT NOTE - Follow Up Consult  Pharmacy Consult for coumadin Indication: heart valve  Allergies  Allergen Reactions  . Augmentin [Amoxicillin-Pot Clavulanate]     Patient Measurements: Weight: 159 lb 4.8 oz (72.258 kg) Heparin Dosing Weight:   Vital Signs: Temp: 99 F (37.2 C) (07/30 0609) Temp Source: Oral (07/30 0609) BP: 94/72 mmHg (07/30 0820) Pulse Rate: 81 (07/30 0820)  Labs:  Recent Labs  08/04/14 0610 08/05/14 0515 08/05/14 0808 08/06/14 0500  HGB 8.5*  --   --   --   HCT 25.8*  --   --   --   PLT 191  --   --   --   LABPROT 24.1* 22.2*  --  23.5*  INR 2.18* 1.96*  --  2.11*  CREATININE 2.18*  --  2.26*  --     Estimated Creatinine Clearance: 24.9 mL/min (by C-G formula based on Cr of 2.26).   Medications:  Scheduled:  . amiodarone  200 mg Oral BID PC  . antiseptic oral rinse  7 mL Mouth Rinse BID  . aspirin EC  81 mg Oral Daily  . atorvastatin  20 mg Oral q1800  . bisacodyl  10 mg Oral Daily   Or  . bisacodyl  10 mg Rectal Daily  . carvedilol  6.25 mg Oral BID WC  . furosemide  40 mg Oral BID  . pantoprazole  40 mg Oral Daily  . potassium chloride  20 mEq Oral BID  . saccharomyces boulardii  250 mg Oral BID  . Warfarin - Pharmacist Dosing Inpatient   Does not apply q1800   Infusions:    Assessment: 69 yo female with heart valve is currently on subtherapeutic coumadin.  INR today is down to 2.11 from 1.96.  Off cipro but on amidoarone.  Goal of Therapy:  INR 2.5-3.5 Monitor platelets by anticoagulation protocol: Yes   Plan:  - coumadin 3 mg po x1 - INR in am  Mickeal Skinner 08/06/2014,10:40 AM

## 2014-08-06 NOTE — Progress Notes (Signed)
Speech Language Pathology Daily Session Note  Patient Details  Name: [name] MRN: [id number] Date of Birth: 04/18/1945  Today's Date: 08/06/2014 SLP Individual Time: 1330-1415 SLP Individual Time Calculation (min): 45 min  Short Term Goals: Week 1: SLP Short Term Goal 1 (Week 1): Patient will utilize memory compensatory strategies to recall new, daily information with Mod A multimodal cues.  SLP Short Term Goal 2 (Week 1): Patient will utilize call bell to request assistance in 50% of opportunities with Mod A multimodal cues.  SLP Short Term Goal 3 (Week 1): Patient will complete functional and familiar tasks safely in regards to problem solving with Mod A multimodal cues.  SLP Short Term Goal 4 (Week 1): Patient will demonstrate selective attention in a mildly distracting enviornment for 30 minutes with Min A verbal cues for redirection.  SLP Short Term Goal 5 (Week 1): Patient will utilize external aids for orientaiton with supervision verbal cues.  SLP Short Term Goal 6 (Week 1): Patient will self-monitor and correct errors with verbal and functional tasks with Mod A multimodal cues.   Skilled Therapeutic Interventions:  Pt was seen for skilled ST targeting cognitive goals.  Upon arrival, pt was partially reclined in bed, awake, alert, and agreeable to participate in ST with mod encouragement.  SLP facilitated the session with a generative naming task targeting functional problem solving, use of word finding strategies, and selective attention.  SLP provided skilled education regarding compensatory strategies for word finding prior to initiation of task.  Due to pt's poor recall of new information, she required max assist verbal cues to implement the abovementioned strategies.  She also required mod assist verbal cues to increase awareness of verbal errors, primarily perseveration.  During the abovementioned task, pt benefited from mod verbal cues for mental flexibility, working  memory, and thought organization.  She selectively attended to task for 15 minute intervals with min cues for redirection for task.  Pt was left in bed with bed alarm activated and all needs within reach.  Continue per current plan of care.      FIM:  Comprehension Comprehension Mode: Auditory Comprehension: 4-Understands basic 75 - 89% of the time/requires cueing 10 - 24% of the time Expression Expression Mode: Verbal Expression: 4-Expresses basic 75 - 89% of the time/requires cueing 10 - 24% of the time. Needs helper to occlude trach/needs to repeat words. Social Interaction Social Interaction: 4-Interacts appropriately 75 - 89% of the time - Needs redirection for appropriate language or to initiate interaction. Problem Solving Problem Solving: 3-Solves basic 50 - 74% of the time/requires cueing 25 - 49% of the time Memory Memory: 3-Recognizes or recalls 50 - 74% of the time/requires cueing 25 - 49% of the time  Pain Pain Assessment Pain Assessment: No/denies pain  Therapy/Group: Individual Therapy  Raia Amico, Melanee Spry 08/06/2014, 3:03 PM

## 2014-08-06 NOTE — Progress Notes (Signed)
Occupational Therapy Session Note  Patient Details  Name: Myshia Blankinship MRN: [id number] Date of Birth: Mar 28, 1945  Today's Date: 08/06/2014 OT Individual Time: 1453-1536 OT Individual Time Calculation (min): 43 min    Skilled Therapeutic Interventions/Progress Updates:    Pt ambulated to the ADL kitchen for work on simple meal prep.  Pt needed constant min assist level for mobility without assistive device.  Once in the kitchen had pt work on scrambling an egg.  Prior to starting she was able to verbally tell therapist items she would and then gathered them appropriately.  During task pt continued to perseverate on two occasions about needing toast as she was focused on making breakfast instead of just scrambling an egg.  Therapist re-directed her on two occasions that we were just focusing on one task, making the egg.  Mod instructional cueing needed for sequencing task, cleaning up supplies.  She was able to turn on and off the burner but did use a burner that was slightly too big for the pan she was using.  Discussed performance of task at conclusion with therapist asking pt if she felt it was harder now than before coming in the hospital.  She acknowledged that it was.  Returned to room with pt transferring back to bed.  Bed alarm in place.  Therapy Documentation Precautions:  Precautions Precautions: Sternal, Fall Restrictions Weight Bearing Restrictions: No  Vital Signs: Therapy Vitals Temp: 98 F (36.7 C) Temp Source: Oral Pulse Rate: 74 Resp: 18 BP: 108/66 mmHg Patient Position (if appropriate): Lying Oxygen Therapy SpO2: 100 % O2 Device: Not Delivered Pain: Pain Assessment Pain Assessment: No/denies pain ADL: See FIM for current functional status  Therapy/Group: Individual Therapy  Chrislyn Seedorf OTR/L 08/06/2014, 4:33 PM

## 2014-08-06 NOTE — Progress Notes (Signed)
Physical Therapy Session Note  Patient Details  Name: [name] MRN: [id number] Date of Birth: [birth year]  Today's Date: 08/06/2014 PT Individual Time: 0800-0900 PT Individual Time Calculation (min): 60 min   Short Term Goals: Week 1:  PT Short Term Goal 1 (Week 1): STGs = LTGs due to ELOS  Skilled Therapeutic Interventions/Progress Updates:    Gait Training: PT instructs pt in ambulation from room to gym req up to min A for balance x 123' reps from room to gym - pt demonstrates reduced reactive balance strategies and stumbles with turns req min A to correct balance.  PT instructs pt in ascending/descending 4 (6" height) stairs with B handrails for balance only req min A in step-to pattern - pt demonstrates 1 LOB on descent req min A to correct.   Neuromuscular Reeducation: PT instructs pt in TUG x 3 attempts without AD (practice rep given) req up to min A to stand and for balance to walk: 28 seconds, 27 seconds, and 27 seconds; AVG: 27 seconds  Five times Sit to Stand Test (FTSS) Method: Use a straight back chair with a solid seat that is 16-18" high. Ask participant to sit on the chair with arms folded across their chest.   Instructions: "Stand up and sit down as quickly as possible 5 times, keeping your arms folded across your chest."   Measurement: Stop timing when the participant stands the 5th time.  TIME: _38_____ (in seconds)  Times > 13.6 seconds is associated with increased disability and morbidity (Guralnik, 2000) Times > 15 seconds is predictive of recurrent falls in healthy individuals aged 54 and older (Buatois, et al., 2008) Normal performance values in community dwelling individuals aged 20 and older (Bohannon, 2006): o 60-69 years: 11.4 seconds o 70-79 years: 12.6 seconds o 80-89 years: 14.8 seconds  MCID: ? 2.3 seconds for Vestibular Disorders (Meretta, 2006)   Therapeutic Activity: Pt received sitting edge eating breakfast, requests to use  bathroom. Pt completes toilet transfer without AD req CGA-SBA. Pt manages diaper and hygiene with verbal cues for sequencing. Pt returns to edge of bed, then completes upper body dressing req assist to latch bra in method of hooking in front and otherwise set-up for PT to bring pt clothes. Pt dresses lower body with assist for shoes and increased time for rest breaks.   Pt is continuing to slowly increase activity tolerance, but requires frequent, prolonged rest breaks due to SOB and fatigue. Pt will benefit from additional balance and endurance training, as well as reinforcement of following sternal precautions with all functional mobility. Continue per PT POC.    Therapy Documentation Precautions:  Precautions Precautions: Sternal, Fall Restrictions Weight Bearing Restrictions: No Vital Signs: Therapy Vitals Temp: 99 F (37.2 C) Temp Source: Oral Pulse Rate: 81 Resp: 18 BP: 94/72 mmHg Patient Position (if appropriate): Sitting Oxygen Therapy SpO2: 100 % O2 Device: Not Delivered Pain: Pain Assessment Pain Assessment: No/denies pain  Balance: Balance Balance Assessed: Yes Standardized Balance Assessment Standardized Balance Assessment: Timed Up and Go Test Timed Up and Go Test TUG: Normal TUG Normal TUG (seconds): 27  See FIM for current functional status  Therapy/Group: Individual Therapy  Rihanna Marseille M 08/06/2014, 8:03 AM

## 2014-08-06 NOTE — Progress Notes (Signed)
  [place] PHYSICAL MEDICINE & REHABILITATION     PROGRESS NOTE    Subjective/Complaints: No complaints. Loose stools have resolved.   No other complaints.   Objective: Vital Signs: Blood pressure 126/58, pulse 76, temperature 99 F (37.2 C), temperature source Oral, resp. rate 18, weight 159 lb 4.8 oz (72.258 kg), SpO2 100 %.   No acute distress HEENT: atraumatic, normocephalic, neck supple Chest clear to auscultation Cardiac exam S1 and S2 are regular without gallop.  Extremities: No edema Assessment/Plan: 1. Functional deficits secondary to debility/anoxic encephalopathy   Medical Problem List and Plan: 1. Functional deficits secondary to debilitation and probable anoxic encephalopathy after cardiac arrest/sepsis, subsequent mitral valve redo  2. DVT Prophylaxis/Anticoagulation: Chronic Coumadin therapy. Lab Results  Component Value Date   INR 2.11* 08/06/2014   INR 1.96* 08/05/2014   INR 2.18* 08/04/2014    3. Pain Management:currently denies pain. 4. Acute blood loss anemia. Lab Results  Component Value Date   HGB 8.5* 08/04/2014    5. Neuropsych: This patient is capable of making decisions on her own behalf. 6. Skin/Wound Care: Routine skin checks 7. Fluids/Electrolytes/Nutrition: electrolytes normal. 8. Hypertension/atrial fibrillation. Amiodarone 200 mg twice a day, Coreg 6.25 mg twice a day, . Good control at present, HR  72-95 bpm BP: 93/62-126/58 9. Diastolic congestive heart failure. Clinically stable.  9. Acute on chronic renal insufficiency.  Lab Results  Component Value Date   CREATININE 2.26* 08/05/2014   10. Hyperlipidemia. Lipitor 11. Diarrhea: Resolved.  LOS (Days) 3 A FACE TO FACE EVALUATION WAS PERFORMED  Bilal Manzer HENRY 08/06/2014 8:04 AM

## 2014-08-06 NOTE — Progress Notes (Signed)
Occupational Therapy Session Note  Patient Details  Name: [name] MRN: [id number] Date of Birth: 09-15-1945  Today's Date: 08/06/2014 OT Individual Time: 1000-1059 OT Individual Time Calculation (min): 59 min    Short Term Goals: Week 1:  OT Short Term Goal 1 (Week 1): STG=LTG d/t anticipated short LOS  Skilled Therapeutic Interventions/Progress Updates:    Pt performed shower and dressing this session.  Mod instructional cueing to remove all clothing in order to shower with min steady assist for ambulation to the shower bench.  Once on the shower seat pt reported needing to void so she transferred stand pivot over to the toilet.  Completion of all toileting hygiene with supervision in sitting.  She transitioned back to the shower bench and completed the shower with mod instructional cueing to finish.  Dressing was then performed EOB with pt sequencing dressing with min questioning cues.  She was able to independently tell therapist she needed a new brief as well when he questioned her as to what to put on next.  Pt left in bed with bed alarm on and call button and phone within reach.    Therapy Documentation Precautions:  Precautions Precautions: Sternal, Fall Restrictions Weight Bearing Restrictions: No  Vital Signs: Therapy Vitals Pulse Rate: 81 BP: 94/72 mmHg Patient Position (if appropriate): Sitting Oxygen Therapy SpO2: 100 % O2 Device: Not Delivered Pain: Pain Assessment Pain Assessment: No/denies pain ADL: See FIM for current functional status  Therapy/Group: Individual Therapy  Jacaria Colburn 08/06/2014, 11:14 AM

## 2014-08-07 ENCOUNTER — Inpatient Hospital Stay (HOSPITAL_COMMUNITY): Payer: Medicare Other | Admitting: Occupational Therapy

## 2014-08-07 LAB — PROTIME-INR
INR: 2.4 — AB (ref 0.00–1.49)
Prothrombin Time: 25.9 seconds — ABNORMAL HIGH (ref 11.6–15.2)

## 2014-08-07 MED ORDER — WARFARIN SODIUM 3 MG PO TABS
3.0000 mg | ORAL_TABLET | Freq: Once | ORAL | Status: AC
  Administered 2014-08-07: 3 mg via ORAL
  Filled 2014-08-07: qty 1

## 2014-08-07 NOTE — IPOC Note (Signed)
Overall Plan of Care [facility]) Patient Details Name: [name] MRN: [id number] DOB: 12-Jul-1945  Admitting Diagnosis: Debility with Cardiac Arrest   Hospital Problems: Principal Problem:   Anoxic encephalopathy Active Problems:   S/P mitral valve replacement with a bileaflet mechanical valve   Debility   Diastolic CHF     Functional Problem List: Nursing Endurance, Bladder, Nutrition, Safety  PT Balance, Skin Integrity, Behavior, Edema, Endurance, Motor, Pain, Perception, Safety  OT Cognition, Endurance, Safety, Balance  SLP Cognition  TR         Basic ADL's: OT Eating, Grooming, Bathing, Dressing, Toileting     Advanced  ADL's: OT Simple Meal Preparation, Light Housekeeping     Transfers: PT Bed Mobility, Bed to Chair, Car, Occupational psychologist, Research scientist (life sciences): PT Ambulation, Psychologist, prison and probation services, Stairs     Additional Impairments: OT None  SLP Social Cognition   Social Interaction, Awareness, Problem Solving, Memory, Attention  TR      Anticipated Outcomes Item Anticipated Outcome  Self Feeding Mod I  Swallowing      Basic self-care  Supervision  Toileting  Supervision   Bathroom Transfers Supervision  Bowel/Bladder  Continent to bowel and bladder  Transfers  Supervision  Locomotion  Supervision  Communication     Cognition  Supervision-Mod A   Pain  Less than 3,on scale 1 to 10  Safety/Judgment  Free from falls during her stay in rehab.   Therapy Plan: PT Intensity: Minimum of 1-2 x/day ,45 to 90 minutes PT Frequency: 5 out of 7 days PT Duration Estimated Length of Stay: 7-10 days OT Intensity: Minimum of 1-2 x/day, 45 to 90 minutes OT Frequency: 5 out of 7 days OT Duration/Estimated Length of Stay: 7-10 days SLP Intensity: Minumum of 1-2 x/day, 30 to 90 minutes SLP Frequency: 3 to 5 out of 7 days SLP Duration/Estimated Length of Stay: 7 days        Team Interventions: Nursing Interventions    PT interventions  Ambulation/gait training, Discharge planning, Functional mobility training, Psychosocial support, Therapeutic Activities, Visual/perceptual remediation/compensation, Balance/vestibular training, Disease management/prevention, Neuromuscular re-education, Skin care/wound management, Therapeutic Exercise, Wheelchair propulsion/positioning, Cognitive remediation/compensation, DME/adaptive equipment instruction, Pain management, Splinting/orthotics, UE/LE Strength taining/ROM, Firefighter, Equities trader education, Museum/gallery curator, UE/LE Coordination activities  OT Interventions Cognitive remediation/compensation, Equities trader education, Self Care/advanced ADL retraining, Therapeutic Activities, DME/adaptive equipment instruction, Functional mobility training, Discharge planning  SLP Interventions Cognitive remediation/compensation, Cueing hierarchy, Functional tasks, Internal/external aids, Environmental controls, Patient/family education, Therapeutic Activities  TR Interventions    SW/CM Interventions Discharge Planning, Psychosocial Support, Patient/Family Education    Team Discharge Planning: Destination: PT-Home ,OT- Home , SLP-Home Projected Follow-up: PT-24 hour supervision/assistance, Other (comment) (Cardiac Rehab), OT-  Home health OT, SLP-24 hour supervision/assistance, Outpatient SLP Projected Equipment Needs: PT-To be determined, OT- To be determined, SLP-None recommended by SLP Equipment Details: PT- , OT-  Patient/family involved in discharge planning: PT- Patient,  OT-Patient, SLP-Patient  MD ELOS: 7-8d Medical Rehab Prognosis:  Good Assessment: 69 y.o. right handed female with history of hypertension, remote history of endocarditis with destruction of mitral valve requiring St. Jude mechanical valve placement by Dr. Cornelius Moras in April 2000 maintained on chronic Coumadin. Independent prior to admission living alone. Presented 07/21/2014 with altered mental status and malaise and  witnessed PEA arrest in the ED with bradycardia down to the 20s. Patient was severely diaphoretic on arrival. Noted creatinine 3.57. Troponin 0.95. EKG showed right bundle branch block and left anterior fascicular block consistent  with previous EKG from 03/11/2014.Marland Kitchen Renal ultrasound with no hydronephrosis. Cardiology as well as critical care medicine follow-up for septic shock and cardiac arrest. Patient required full intubation. TEE completed showing ejection fraction of 55% motions of the leaflets of the mechanical valve in the mitral position appeared restricted. Underwent redo mitral valve replacement 07/25/2014 per Dr. Cornelius Moras.    Now requiring 24/7 Rehab RN,MD, as well as CIR level PT, OT and SLP.  Treatment team will focus on ADLs and mobility with goals set at sup  See Team Conference Notes for weekly updates to the plan of care

## 2014-08-07 NOTE — Progress Notes (Signed)
Social Work  Social Work Assessment and Plan  Patient Details  Name: [name] MRN: [id number] Date of Birth: 67-Mar-1947  Today's Date: 08/07/2014  Problem List:  Patient Active Problem List   Diagnosis Date Noted  . Debility 08/04/2014  . Anoxic encephalopathy 08/04/2014  . Diastolic CHF 08/04/2014  . 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 S/P redo mitral valve replacement with bioprosthetic valve 07/25/2014  . Prosthetic valve dysfunction 07/22/2014  . Shock 07/21/2014  . Cardiac arrest 07/21/2014  . Acute respiratory failure, unspecified whether with hypoxia or hypercapnia   . Metabolic acidosis   . RBBB 03/11/2014  . HTN (hypertension) 01/23/2013  . Dyslipidemia 01/23/2013  . S/P mitral valve replacement with a bileaflet mechanical valve 04/11/1998  . Long term current use of anticoagulant therapy for mechanical mitral valve 04/11/1998   Past Medical History:  Past Medical History  Diagnosis Date  . Hypertension   . Hyperlipidemia   . S/P MVR (mitral valve replacement) 04/11/1998    #29 St. Jude bileaflet mechanical valve for bacterial endocarditis  . 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 S/P redo mitral valve replacement with bioprosthetic valve 07/25/2014    27 mm [facility] Mitral bovine bioprosthetic tissue valve   Past Surgical History:  Past Surgical History  Procedure Laterality Date  . Mitral valve replacement  04/11/1998    St. Jude mechanical MVR (severe MR, episode of streptococcal bacterial endocarditis - Dr. Cornelius Moras  . Tubal ligation    . Transthoracic echocardiogram  11/2009    EF=>55%; bi-leaflet St. Jude mech prosthesis; mild TR, RVSP elevated at 40-65mmHg, mod pulm HTN; trace AV regurg; mild pulm valve regurg  . Total  abdominal hysterectomy w/ bilateral salpingoophorectomy  12/14/1998  . Cardiac catheterization N/A 07/24/2014    Procedure: Right/Left Heart Cath and Coronary Angiography;  Surgeon: Iran Ouch, MD;  Location: [facility] INVASIVE CV LAB;  Service: Cardiovascular;  Laterality: N/A;  . Cardiac catheterization N/A 07/22/2014    Procedure: Fluoroscopy Guidance;  Surgeon: Lennette Bihari, MD;  Location: [facility] INVASIVE CV LAB;  Service: Cardiovascular;  Laterality: N/A;  . Mitral valve replacement N/A 07/25/2014    Procedure: REDO MITRAL VALVE REPLACEMENT (MVR);  Surgeon: Purcell Nails, MD;  Location: [facility] OR;  Service: Open Heart Surgery;  Laterality: N/A;   Social History:  reports that she quit smoking about 41 years ago. She has never used smokeless tobacco. She reports that she does not drink alcohol or use illicit drugs.  Family / Support Systems Marital Status: Divorced How Long?: "many years ago" Patient Roles: Parent, Other (Comment) (grandparent) Children: daughter, [name] @ (H) [phone] or (C) [phone] plus 3 son (in [place]?) Other Supports: neice, Jamey Ripa @ [phone] Anticipated Caregiver: [name] and supportive family Ability/Limitations of Caregiver: no limitations as dtr plans to take time off work Garment/textile technologist is a Armed forces operational officer) and plans to have pt stay with her until pt is able to return home independently to her apartment. 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  She often "scowls" at herself with her answers and reports she does feel she is having some recall difficulty.  Flat affect overall and appears almost disconnected with interview process at times i.e. gazes away, voice/ answers trail off.  She denies any s/s of significant emotional distress but do want to monitor this.  A formal depression screen not indicated at this time, however, will refer to neuropsychology for further evaluation of cognition. Recent Psychosocial Issues: None Pyschiatric History: None Substance Abuse History: None  Patient / Family Perceptions, Expectations & Goals Pt/Family understanding of illness & functional limitations: pt only able to state, "They had to do a re-do...".  When questioned where, she was able to note that she had cardaic surgery to redo her MVR Premorbid pt/family roles/activities: Pt was living alone and was caring for her 1 y.o granddaughter Anticipated changes in roles/activities/participation: most change will be dependent on her cognitive recovery.  Responsibility of grandaughter might be a concern if pt/ family are hoping for her to be able to return to doing that. Pt/family expectations/goals: Pt's focus is on regaining her strength and returning to care for her grandaughter  [employer]: None Premorbid Home Care/DME Agencies: None Transportation available at discharge: yes Resource referrals recommended: Neuropsychology  Discharge Planning Living Arrangements: Alone Support Systems: Children, Other relatives Type of Residence: Private residence Insurance Resources:  [employer], Media planner (specify) Building services engineer) Financial Resources: Restaurant manager, fast food Screen Referred: No Living Expenses: Psychologist, sport and exercise Management: Patient Does the patient have any problems obtaining your medications?: No Home Management: pt Patient/Family Preliminary Plans: pt reports that she will discharge to her daughter's home initially Social Work Anticipated Follow Up Needs: HH/OP Expected length of stay: 7-10 days  Clinical Impression Pleasant woman here following MVR surgery and now with physical and some cognitive deficits.  Appears to have good family support and plans to d/c to daughter's home after CIR, however, have not yet been able to connect with daughter to confirm these plans.  Pt does seem to be aware that she has had some decline in cognition.  ST following and will ask for neuropsychology consult as well to determine extent.  Will follow for support and d/c planning needs.  Shakyra Mattera 08/07/2014, 11:28 AM

## 2014-08-07 NOTE — Progress Notes (Signed)
ANTICOAGULATION CONSULT NOTE - Follow Up Consult  Pharmacy Consult for coumadin Indication: heart valve  Allergies  Allergen Reactions  . Augmentin [Amoxicillin-Pot Clavulanate]     Patient Measurements: Weight: 150 lb 11.2 oz (68.357 kg) Heparin Dosing Weight:   Vital Signs: Temp: 98.4 F (36.9 C) (07/31 0459) Temp Source: Oral (07/31 0459) BP: 132/66 mmHg (07/31 0459) Pulse Rate: 83 (07/31 0459)  Labs:  Recent Labs  08/05/14 0515 08/05/14 0808 08/06/14 0500 08/07/14 0513  LABPROT 22.2*  --  23.5* 25.9*  INR 1.96*  --  2.11* 2.40*  CREATININE  --  2.26*  --   --     Estimated Creatinine Clearance: 22.9 mL/min (by C-G formula based on Cr of 2.26).   Medications:  Scheduled:  . amiodarone  200 mg Oral BID PC  . antiseptic oral rinse  7 mL Mouth Rinse BID  . aspirin EC  81 mg Oral Daily  . atorvastatin  20 mg Oral q1800  . bisacodyl  10 mg Oral Daily   Or  . bisacodyl  10 mg Rectal Daily  . carvedilol  6.25 mg Oral BID WC  . furosemide  40 mg Oral BID  . pantoprazole  40 mg Oral Daily  . potassium chloride  20 mEq Oral BID  . saccharomyces boulardii  250 mg Oral BID  . Warfarin - Pharmacist Dosing Inpatient   Does not apply q1800   Infusions:    Assessment: 69 yo female with heart valve is currently on subtherapeutic coumadin.  INR today is down to 2.4 from 2.11.  Off cipro but on amidoarone.  Goal of Therapy:  INR 2.5-3.5 Monitor platelets by anticoagulation protocol: Yes   Plan:  - coumadin 3 mg po x1 - INR in am  [name] 08/07/2014,8:03 AM

## 2014-08-07 NOTE — Progress Notes (Signed)
  [place] PHYSICAL MEDICINE & REHABILITATION     PROGRESS NOTE    Subjective/Complaints: Feels well , no complaints  Needs to use BR and was "afraid to bother the nurses"  Objective: Vital Signs: Blood pressure 132/66, pulse 83, temperature 98.4 F (36.9 C), temperature source Oral, resp. rate 18, weight 150 lb 11.2 oz (68.357 kg), SpO2 99 %.   No acute distress HEENT: atraumatic, normocephalic, neck supple Chest clear to auscultation Cardiac exam S1 and S2 are regular without gallop.  Extremities: No edema Assessment/Plan: 1. Functional deficits secondary to debility/anoxic encephalopathy   Medical Problem List and Plan: 1. Functional deficits secondary to debilitation and probable anoxic encephalopathy after cardiac arrest/sepsis, subsequent mitral valve redo  2. DVT Prophylaxis/Anticoagulation: Chronic Coumadin therapy. Lab Results  Component Value Date   INR 2.40* 08/07/2014   INR 2.11* 08/06/2014   INR 1.96* 08/05/2014    3. Pain Management:currently denies pain. 4. Acute blood loss anemia. Lab Results  Component Value Date   HGB 8.5* 08/04/2014    5. Neuropsych: This patient is capable of making decisions on her own behalf. 6. Skin/Wound Care: Routine skin checks 7. Fluids/Electrolytes/Nutrition: electrolytes normal. 8. Hypertension/atrial fibrillation. Amiodarone 200 mg twice a day, Coreg 6.25 mg twice a day, . Good control at present, HR  72-95 bpm BP: is controlled 9. Diastolic congestive heart failure. Clinically stable.  9. Acute on chronic renal insufficiency.  Lab Results  Component Value Date   CREATININE 2.26* 08/05/2014   10. Hyperlipidemia. Lipitor 11. Diarrhea: Resolved.  LOS (Days) 4 A FACE TO FACE EVALUATION WAS PERFORMED  SWORDS,BRUCE HENRY 08/07/2014 8:05 AM

## 2014-08-07 NOTE — Progress Notes (Signed)
Occupational Therapy Session Note  Patient Details  Name: [name] MRN: [id number] Date of Birth: [birth year]  Today's Date: 08/07/2014 OT Individual Time:  -   1530-1615  (45 min)      Short Term Goals: Week 1:  OT Short Term Goal 1 (Week 1): STG=LTG d/t anticipated short LOS Week 2:     Skilled Therapeutic Interventions/Progress Updates:    Skilled OT intervention with treatment focus on the following:  Cognition, endurance, functional mobility, balance.  Pt. amubulated to gym area.  Engaged in simple house duty of dusting counters.  Pt had decreased attention to detail during activity.  She had no LOB , but needed close SBA during tasks.  She able to provide recipe for cupcakes and icing.  Pt. Found her room at the end of the session.  Left pt in room in bed with all needs in reach and bed alarm engaged.     Therapy Documentation Precautions:  Precautions Precautions: Sternal, Fall Restrictions Weight Bearing Restrictions: No       Pain:  none   ADL: ADL ADL Comments: see FIM  See FIM for current functional status  Therapy/Group: Individual Therapy  Humberto Seals 08/07/2014, 6:32 PM

## 2014-08-08 ENCOUNTER — Inpatient Hospital Stay (HOSPITAL_COMMUNITY): Payer: Medicare Other

## 2014-08-08 ENCOUNTER — Inpatient Hospital Stay (HOSPITAL_COMMUNITY): Payer: Medicare Other | Admitting: Speech Pathology

## 2014-08-08 ENCOUNTER — Inpatient Hospital Stay (HOSPITAL_COMMUNITY): Payer: Medicare Other | Admitting: Physical Therapy

## 2014-08-08 ENCOUNTER — Inpatient Hospital Stay (HOSPITAL_COMMUNITY): Payer: Self-pay | Admitting: Occupational Therapy

## 2014-08-08 DIAGNOSIS — I5033 Acute on chronic diastolic (congestive) heart failure: Secondary | ICD-10-CM

## 2014-08-08 LAB — BASIC METABOLIC PANEL
Anion gap: 7 (ref 5–15)
BUN: 13 mg/dL (ref 6–20)
CHLORIDE: 103 mmol/L (ref 101–111)
CO2: 27 mmol/L (ref 22–32)
Calcium: 10.6 mg/dL — ABNORMAL HIGH (ref 8.9–10.3)
Creatinine, Ser: 2.35 mg/dL — ABNORMAL HIGH (ref 0.44–1.00)
GFR, EST AFRICAN AMERICAN: 24 mL/min — AB (ref >60)
GFR, EST NON AFRICAN AMERICAN: 20 mL/min — AB (ref >60)
Glucose, Bld: 147 mg/dL — ABNORMAL HIGH (ref 65–99)
Potassium: 3.7 mmol/L (ref 3.5–5.1)
SODIUM: 137 mmol/L (ref 135–145)

## 2014-08-08 LAB — CBC
HEMATOCRIT: 28 % — AB (ref 36.0–46.0)
HEMOGLOBIN: 9 g/dL — AB (ref 12.0–15.0)
MCH: 28.3 pg (ref 26.0–34.0)
MCHC: 32.1 g/dL (ref 30.0–36.0)
MCV: 88.1 fL (ref 78.0–100.0)
PLATELETS: 297 10*3/uL (ref 150–400)
RBC: 3.18 MIL/uL — ABNORMAL LOW (ref 3.87–5.11)
RDW: 13.9 % (ref 11.5–15.5)
WBC: 6.3 10*3/uL (ref 4.0–10.5)

## 2014-08-08 LAB — PROTIME-INR
INR: 2.77 — ABNORMAL HIGH (ref 0.00–1.49)
Prothrombin Time: 28.8 seconds — ABNORMAL HIGH (ref 11.6–15.2)

## 2014-08-08 MED ORDER — WARFARIN SODIUM 3 MG PO TABS
3.0000 mg | ORAL_TABLET | Freq: Once | ORAL | Status: AC
  Administered 2014-08-08: 3 mg via ORAL
  Filled 2014-08-08: qty 1

## 2014-08-08 MED ORDER — SODIUM CHLORIDE 0.45 % IV SOLN
Freq: Once | INTRAVENOUS | Status: AC
  Administered 2014-08-08: 18:00:00 via INTRAVENOUS

## 2014-08-08 NOTE — Progress Notes (Signed)
Physical Therapy Session Note  Patient Details  Name: [name] MRN: [id number] Date of Birth: 07/10/1945  Today's Date: 08/08/2014 PT Individual Time: 1300-1404 PT Individual Time Calculation (min): 64 min   Short Term Goals: Week 1:  PT Short Term Goal 1 (Week 1): STGs = LTGs due to ELOS  Skilled Therapeutic Interventions/Progress Updates:     W/C: Pt instructed in sternal precautions related to functional mobility. Pt propelled w/c x150' to therapy gym with LEs only requiring min-mod A from PT to steer and propel w/c forwards.    Therapeutic Exercise: Pt performed sit<>stand x5 from a variety of surfaces with supervision and min cues to maintain sternal precautions. Pt participated in NuStep exercise to increase endurance, resistance at 4 x3 minutes (87 bpm and 93% O2 assessed at rest break) and x4 minutes (78 bpm and 98% O2 assessed at conclusion). Pt performed toilet transfer once back in room with supervision for balance.   NMR: Pt performed activities to address dynamic sitting balance and cognitive deficits. Pt grouped bean bags by color and planned 3 meals with food bean bags focusing on reaching in different directions without LE or UE support. Pt matched playing cards on back of mirror focusing on reaching to various heights.   Pt returned to room at end of session and left sitting in recliner with QRB on and needs in reach.   Therapy Documentation Precautions:  Precautions Precautions: Sternal, Fall Restrictions Weight Bearing Restrictions: No (sternal precautions)  Vital Signs: Therapy Vitals Pulse Rate: 60 BP: 69/50 mmHg Patient Position (if appropriate): Sitting SpO2: 100 %  Pain: Pain Assessment Pain Assessment: No/denies pain  Locomotion : Wheelchair Mobility Distance: 150   See FIM for current functional status  Therapy/Group: Individual Therapy  [name] 08/08/2014, 4:08 PM

## 2014-08-08 NOTE — Progress Notes (Signed)
[place] PHYSICAL MEDICINE & REHABILITATION     PROGRESS NOTE    Subjective/Complaints: No more loose stools over weekend. States things went fairly well..   ROS: Pt denies fever, rash/itching, headache, blurred or double vision, nausea, vomiting, abdominal pain, chest pain, shortness of breath, palpitations, dysuria, dizziness, neck or back pain, bleeding, anxiety, or depression   Objective: Vital Signs: Blood pressure 117/62, pulse 75, temperature 98.9 F (37.2 C), temperature source Oral, resp. rate 18, weight 67 kg (147 lb 11.3 oz), SpO2 100 %. No results found. No results for input(s): WBC, HGB, HCT, PLT in the last 72 hours. No results for input(s): NA, K, CL, GLUCOSE, BUN, CREATININE, CALCIUM in the last 72 hours.  Invalid input(s): CO CBG (last 3)  No results for input(s): GLUCAP in the last 72 hours.  Wt Readings from Last 3 Encounters:  08/08/14 67 kg (147 lb 11.3 oz)  08/03/14 71.714 kg (158 lb 1.6 oz)  03/11/14 68.72 kg (151 lb 8 oz)    Physical Exam:  Constitutional: She appears well-developed.  HENT: oral mucosa pink and moist Head: Normocephalic.  Eyes: EOM are normal.  Neck: Normal range of motion. Neck supple. No thyromegaly present.  Cardiovascular:  Cardiac rate controlled. No murmurs.no LE edema.  Respiratory: Effort normal and breath sounds normal. No respiratory distress.  GI: Soft. Bowel sounds are normal. She exhibits no distension.  Neurological: She is alert.  Mood is flat  She was able to provide her age but needed some cues for dates. Fair insight and awareness.  Still needs cues for higher level tasks and sequencing. Follows simple commands. UE 4/5 prox to distal with inhibition due to pain/sternum. LE: 2+ hf, 3ke, 4/5 adf/afp. Decreased sensation in distal feet/toes.  Skin: Skin is warm and dry. Wound incision healing nicely along chest, drain/pacer lead site with steristrips /dried blood Psychiatric: She has a normal mood and  affect. pleasant   Assessment/Plan: 1. Functional deficits secondary to debility/anoxic encephalopathy which require 3+ hours per day of interdisciplinary therapy in a comprehensive inpatient rehab setting. Physiatrist is providing close team supervision and 24 hour management of active medical problems listed below. Physiatrist and rehab team continue to assess barriers to discharge/monitor patient progress toward functional and medical goals. FIM: FIM - Bathing Bathing Steps Patient Completed: Chest, Abdomen, Front perineal area, Buttocks, Right Arm, Left Arm, Right upper leg, Left upper leg, Right lower leg (including foot), Left lower leg (including foot) Bathing: 4: Steadying assist  FIM - Upper Body Dressing/Undressing Upper body dressing/undressing steps patient completed: Thread/unthread right bra strap, Thread/unthread left bra strap, Thread/unthread right sleeve of front closure shirt/dress, Thread/unthread left sleeve of front closure shirt/dress, Pull shirt around back of front closure shirt/dress, Thread/unthread right sleeve of pullover shirt/dresss, Thread/unthread left sleeve of pullover shirt/dress, Put head through opening of pull over shirt/dress, Pull shirt over trunk Upper body dressing/undressing: 4: Steadying assist FIM - Lower Body Dressing/Undressing Lower body dressing/undressing steps patient completed: Pull underwear up/down, Thread/unthread left underwear leg, Thread/unthread right underwear leg, Thread/unthread right pants leg, Thread/unthread left pants leg, Pull pants up/down, Don/Doff right sock, Don/Doff left sock Lower body dressing/undressing: 4: Steadying Assist  FIM - Toileting Toileting steps completed by patient: Adjust clothing prior to toileting, Performs perineal hygiene, Adjust clothing after toileting Toileting Assistive Devices: Grab bar or rail for support Toileting: 4: Steadying assist  FIM - Diplomatic Services operational officer Devices:  Grab bars, Elevated toilet seat Toilet Transfers: 4-To toilet/BSC: Min A (steadying Pt. >  75%), 4-From toilet/BSC: Min A (steadying Pt. > 75%)  FIM - Bed/Chair Transfer Bed/Chair Transfer Assistive Devices: HOB elevated Bed/Chair Transfer: 5: Supine > Sit: Supervision (verbal cues/safety issues), 5: Sit > Supine: Supervision (verbal cues/safety issues)  FIM - Locomotion: Wheelchair Distance: 150 Locomotion: Wheelchair: 0: Activity did not occur FIM - Locomotion: Ambulation Locomotion: Ambulation Assistive Devices: Other (comment) (no AD) Ambulation/Gait Assistance: 4: Min assist Locomotion: Ambulation: 2: Travels 50 - 149 ft with minimal assistance (Pt.>75%)  Comprehension Comprehension Mode: Auditory Comprehension: 4-Understands basic 75 - 89% of the time/requires cueing 10 - 24% of the time  Expression Expression Mode: Verbal Expression: 4-Expresses basic 75 - 89% of the time/requires cueing 10 - 24% of the time. Needs helper to occlude trach/needs to repeat words.  Social Interaction Social Interaction: 4-Interacts appropriately 75 - 89% of the time - Needs redirection for appropriate language or to initiate interaction.  Problem Solving Problem Solving: 3-Solves basic 50 - 74% of the time/requires cueing 25 - 49% of the time  Memory Memory: 3-Recognizes or recalls 50 - 74% of the time/requires cueing 25 - 49% of the time  Medical Problem List and Plan: 1. Functional deficits secondary to debilitation and probable anoxic encephalopathy after cardiac arrest/sepsis, subsequent mitral valve redo  2. DVT Prophylaxis/Anticoagulation: Chronic Coumadin therapy. INR therapeutic 2.8 3. Pain Management: Ultram as needed. Monitor pain needs with increased mobility 4. Acute blood loss anemia. hgb 8.5 . Continue serial labs.  Check tomorrow 5. Neuropsych: This patient is capable of making decisions on her own behalf. 6. Skin/Wound Care: Routine skin checks 7.  Fluids/Electrolytes/Nutrition: electrolytes normal. 8. Hypertension/atrial fibrillation. Amiodarone 200 mg twice a day, Coreg 6.25 mg twice a day, . Good control at present, HR  72-95 bpm 9. Diastolic congestive heart failure. Continue Lasix 40 mg twice daily. Monitor for any signs of fluid overload -following weights and i's and o's, weight 67 kg today 9. Acute on chronic renal insufficiency. Baseline creatinine 1.48---- now around creatinine 2.06 over last few lab checks. . Renal ultrasound negative.   -recheck electrolytes tomorrow 10. Hyperlipidemia. Lipitor 11. Diarrhea: resolved---laxative induced   LOS (Days) 5 A FACE TO FACE EVALUATION WAS PERFORMED  Coren Sagan T 08/08/2014 8:30 AM

## 2014-08-08 NOTE — Progress Notes (Signed)
Occupational Therapy Session Note  Patient Details  Name: [name] MRN: [id number] Date of Birth: Aug 03, 1945  Today's Date: 08/08/2014 OT Individual Time: 1000-1100 OT Individual Time Calculation (min): 60 min   Short Term Goals: Week 1:  OT Short Term Goal 1 (Week 1): STG=LTG d/t anticipated short LOS  Skilled Therapeutic Interventions/Progress Updates: ADL-retraining with focus on improved activity tolerance, functional transfers, functional mobility, intellectual awareness, memory, sequencing and dynamic standing balance.   Pt received seated in w/c awaiting therapist and reporting symptoms of fatigue.   Pt agreed to bathing/dressing at shower level although requesting directions on what she "should do" in therapy.   After re-ed on goals of treatment, pt ambulated to bathroom with hand-held guidance and toileted with supervision to sequence.   Pt required cues to undress beside toilet d/t fall risk.   Pt demo'd LOB while standing supported but recovered to transfer to toilet with steadying assist.   Pt toileted and ambulated to shower, bathing sitting and standing with steadying assist and min vc to sequence through task.   Pt dressed at EOB with setup.   Pt continues to wear briefs and was challenged for reported need of briefs d/t no accidents or report of incontinence.    Pt agreed to decrease use of briefs during next session.   Pt groomed at sink after rest break d/t fatigue and was noted for low BP and poor oxygen saturation (see flowsheet).   Pt required extra time to recover and was assisted to recliner at end of session with QRB attached and call light within reach.     Therapy Documentation Precautions:  Precautions Precautions: Sternal, Fall Restrictions Weight Bearing Restrictions: No (sternal precautions)   Vital Signs: Therapy Vitals Pulse Rate: 67 Resp: 18 BP: (!) 77/51 mmHg Patient Position (if appropriate): Sitting Oxygen Therapy SpO2: 100 % O2 Device:  Not Delivered   Pain: Pain Assessment Pain Score: 0-No pain   ADL: ADL ADL Comments: see FIM   See FIM for current functional status  Therapy/Group: Individual Therapy  Jeane Cashatt 08/08/2014, 12:45 PM

## 2014-08-08 NOTE — Progress Notes (Signed)
Occupational Therapy Session Note  Patient Details  Name: [name] MRN: [id number] Date of Birth: Apr 13, 1945  Today's Date: 08/08/2014 OT Individual Time: 1500-1530 OT Individual Time Calculation (min): 30 min    Short Term Goals: Week 1:  OT Short Term Goal 1 (Week 1): STG=LTG d/t anticipated short LOS  Skilled Therapeutic Interventions/Progress Updates:    1:1 focus on cognitive retraining with focus on selective to alternating attention to task, task organization, sorting and anticipatory meal planning for granddaughter. Pt required mod cuing to organize tasks and express her thought process to therapist. Pt with max for verbal and written organization in regards  in planning a meal for her granddaughter similar to task she was performing prior to admission.   Therapy Documentation Precautions:  Precautions Precautions: Sternal, Fall Restrictions Weight Bearing Restrictions: No (sternal precautions) Pain:  no c/o pain ADL: ADL ADL Comments: see FIM  See FIM for current functional status  Therapy/Group: Individual Therapy  Roney [facility] 08/08/2014, 3:41 PM

## 2014-08-08 NOTE — Progress Notes (Signed)
ANTICOAGULATION CONSULT NOTE - Follow Up Consult  Pharmacy Consult for coumadin Indication: mechanical heart valve  Allergies  Allergen Reactions  . Augmentin [Amoxicillin-Pot Clavulanate]     Patient Measurements: Weight: 147 lb 11.3 oz (67 kg) Heparin Dosing Weight:   Vital Signs: Temp: 98.9 F (37.2 C) (08/01 0529) Temp Source: Oral (08/01 0529) BP: 117/62 mmHg (08/01 0529) Pulse Rate: 75 (08/01 0529)  Labs:  Recent Labs  08/06/14 0500 08/07/14 0513  LABPROT 23.5* 25.9*  INR 2.11* 2.40*    Estimated Creatinine Clearance: 22.9 mL/min (by C-G formula based on Cr of 2.26).   Medications:  Scheduled:  . amiodarone  200 mg Oral BID PC  . antiseptic oral rinse  7 mL Mouth Rinse BID  . aspirin EC  81 mg Oral Daily  . atorvastatin  20 mg Oral q1800  . bisacodyl  10 mg Oral Daily   Or  . bisacodyl  10 mg Rectal Daily  . carvedilol  6.25 mg Oral BID WC  . furosemide  40 mg Oral BID  . pantoprazole  40 mg Oral Daily  . potassium chloride  20 mEq Oral BID  . saccharomyces boulardii  250 mg Oral BID  . Warfarin - Pharmacist Dosing Inpatient   Does not apply q1800   Infusions:    Assessment: 69 yo female with mechanical heart valve is currently on therapeutic coumadin.  INR today is 2.77.  Goal of Therapy:  INR 2.5-3.5 Monitor platelets by anticoagulation protocol: Yes   Plan:  - coumadin 3 mg po x1 - INR in am;   Nicholette Dolson, Tsz-Yin 08/08/2014,8:15 AM

## 2014-08-08 NOTE — Progress Notes (Signed)
Speech Language Pathology Daily Session Note  Patient Details  Name: [name] MRN: [id number] Date of Birth: 04/17/1945  Today's Date: 08/08/2014 SLP Individual Time: 0900-1000 SLP Individual Time Calculation (min): 60 min  Short Term Goals: Week 1: SLP Short Term Goal 1 (Week 1): Patient will utilize memory compensatory strategies to recall new, daily information with Mod A multimodal cues.  SLP Short Term Goal 2 (Week 1): Patient will utilize call bell to request assistance in 50% of opportunities with Mod A multimodal cues.  SLP Short Term Goal 3 (Week 1): Patient will complete functional and familiar tasks safely in regards to problem solving with Mod A multimodal cues.  SLP Short Term Goal 4 (Week 1): Patient will demonstrate selective attention in a mildly distracting enviornment for 30 minutes with Min A verbal cues for redirection.  SLP Short Term Goal 5 (Week 1): Patient will utilize external aids for orientaiton with supervision verbal cues.  SLP Short Term Goal 6 (Week 1): Patient will self-monitor and correct errors with verbal and functional tasks with Mod A multimodal cues.   Skilled Therapeutic Interventions: Skilled treatment session focused on cognitive goals. Upon arrival, patient was supine in bed with daughter present. Patient transferred to the wheelchair and was agreeable to participate in session.  SLP facilitated session by providing total A for recall of current medications and their functions and Max A multimodal cues for problem solving and organization during a mildly complex but familiar task of organizing a 2 time per day pill box with use of a written aid. Patient's daughter present at beginning of session and reported baseline cognitive deficits.  SLP educated daughter that patient will require 24 hour supervision at discharge.  Patient left upright in wheelchair with quick release belt in place and all needs within reach. Continue with current plan of  care.    FIM:  Expression Expression Mode: Verbal Expression: 5-Expresses basic needs/ideas: With extra time/assistive device Social Interaction Social Interaction: 5-Interacts appropriately 90% of the time - Needs monitoring or encouragement for participation or interaction. Problem Solving Problem Solving: 3-Solves basic 50 - 74% of the time/requires cueing 25 - 49% of the time Memory Memory: 3-Recognizes or recalls 50 - 74% of the time/requires cueing 25 - 49% of the time  Pain Pain Assessment Pain Assessment: No/denies pain  Therapy/Group: Individual Therapy  Shalini Mair 08/08/2014, 4:11 PM

## 2014-08-09 ENCOUNTER — Inpatient Hospital Stay (HOSPITAL_COMMUNITY): Payer: Medicare Other | Admitting: Speech Pathology

## 2014-08-09 ENCOUNTER — Inpatient Hospital Stay (HOSPITAL_COMMUNITY): Payer: Medicare Other

## 2014-08-09 ENCOUNTER — Inpatient Hospital Stay (HOSPITAL_COMMUNITY): Payer: Medicare Other | Admitting: Physical Therapy

## 2014-08-09 DIAGNOSIS — N289 Disorder of kidney and ureter, unspecified: Secondary | ICD-10-CM

## 2014-08-09 LAB — BASIC METABOLIC PANEL
Anion gap: 7 (ref 5–15)
BUN: 12 mg/dL (ref 6–20)
CHLORIDE: 103 mmol/L (ref 101–111)
CO2: 25 mmol/L (ref 22–32)
CREATININE: 2.21 mg/dL — AB (ref 0.44–1.00)
Calcium: 10.1 mg/dL (ref 8.9–10.3)
GFR calc non Af Amer: 22 mL/min — ABNORMAL LOW (ref >60)
GFR, EST AFRICAN AMERICAN: 26 mL/min — AB (ref >60)
Glucose, Bld: 96 mg/dL (ref 65–99)
POTASSIUM: 4 mmol/L (ref 3.5–5.1)
Sodium: 135 mmol/L (ref 135–145)

## 2014-08-09 LAB — PROTIME-INR
INR: 3.75 — ABNORMAL HIGH (ref 0.00–1.49)
Prothrombin Time: 36.2 seconds — ABNORMAL HIGH (ref 11.6–15.2)

## 2014-08-09 MED ORDER — WARFARIN SODIUM 1 MG PO TABS
1.0000 mg | ORAL_TABLET | Freq: Once | ORAL | Status: AC
  Administered 2014-08-09: 1 mg via ORAL
  Filled 2014-08-09: qty 1

## 2014-08-09 MED ORDER — FUROSEMIDE 40 MG PO TABS
40.0000 mg | ORAL_TABLET | Freq: Two times a day (BID) | ORAL | Status: DC
  Administered 2014-08-10: 40 mg via ORAL
  Filled 2014-08-09: qty 1

## 2014-08-09 NOTE — Progress Notes (Signed)
ANTICOAGULATION CONSULT NOTE - Follow Up Consult  Pharmacy Consult for coumadin Indication: mechanical heart valve  Allergies  Allergen Reactions  . Augmentin [Amoxicillin-Pot Clavulanate]     Patient Measurements: Weight: 151 lb 1.3 oz (68.53 kg) Heparin Dosing Weight:   Vital Signs: Temp: 98.4 F (36.9 C) (08/02 0609) Temp Source: Oral (08/02 0609) BP: 128/67 mmHg (08/02 0609) Pulse Rate: 83 (08/02 0609)  Labs:  Recent Labs  08/07/14 0513 08/08/14 0739 08/08/14 1515 08/09/14 0600  HGB  --   --  9.0*  --   HCT  --   --  28.0*  --   PLT  --   --  297  --   LABPROT 25.9* 28.8*  --  36.2*  INR 2.40* 2.77*  --  3.75*  CREATININE  --   --  2.35* 2.21*    Estimated Creatinine Clearance: 23.4 mL/min (by C-G formula based on Cr of 2.21).   Medications:  Scheduled:  . amiodarone  200 mg Oral BID PC  . antiseptic oral rinse  7 mL Mouth Rinse BID  . aspirin EC  81 mg Oral Daily  . atorvastatin  20 mg Oral q1800  . bisacodyl  10 mg Oral Daily   Or  . bisacodyl  10 mg Rectal Daily  . carvedilol  6.25 mg Oral BID WC  . [START ON 08/10/2014] furosemide  40 mg Oral BID  . pantoprazole  40 mg Oral Daily  . potassium chloride  20 mEq Oral BID  . saccharomyces boulardii  250 mg Oral BID  . Warfarin - Pharmacist Dosing Inpatient   Does not apply q1800   Infusions:    Assessment: 69 yo female with MVR s/p valve replacement is currently on supratherapeutic coumadin.  INR today is up to 3.75 from 2.77 Goal of Therapy:  INR 2.5-3.5 Monitor platelets by anticoagulation protocol: Yes   Plan:  - coumadin 1 mg po x1 - INR in am  [name], Tsz-Yin 08/09/2014,8:05 AM

## 2014-08-09 NOTE — Progress Notes (Signed)
[place] PHYSICAL MEDICINE & REHABILITATION     PROGRESS NOTE    Subjective/Complaints: bp's dipped in therapy yesterday. resopnded to IVF. Rested fairly well last night. Just waking up this morning  ROS: Pt denies fever, rash/itching, headache, blurred or double vision, nausea, vomiting, abdominal pain, chest pain, shortness of breath, palpitations, dysuria,, neck or back pain, bleeding, anxiety, or depression   Objective: Vital Signs: Blood pressure 128/67, pulse 83, temperature 98.4 F (36.9 C), temperature source Oral, resp. rate 19, weight 68.53 kg (151 lb 1.3 oz), SpO2 98 %. No results found.  Recent Labs  08/08/14 1515  WBC 6.3  HGB 9.0*  HCT 28.0*  PLT 297    Recent Labs  08/08/14 1515 08/09/14 0600  NA 137 135  K 3.7 4.0  CL 103 103  GLUCOSE 147* 96  BUN 13 12  CREATININE 2.35* 2.21*  CALCIUM 10.6* 10.1   CBG (last 3)  No results for input(s): GLUCAP in the last 72 hours.  Wt Readings from Last 3 Encounters:  08/09/14 68.53 kg (151 lb 1.3 oz)  08/03/14 71.714 kg (158 lb 1.6 oz)  03/11/14 68.72 kg (151 lb 8 oz)    Physical Exam:  Constitutional: She appears well-developed.  HENT: oral mucosa pink and moist Head: Normocephalic.  Eyes: EOM are normal.  Neck: Normal range of motion. Neck supple. No thyromegaly present.  Cardiovascular:  Cardiac rate controlled. No murmurs.no LE edema.  Respiratory: Effort normal and breath sounds normal. No respiratory distress.  GI: Soft. Bowel sounds are normal. She exhibits no distension.  Neurological: She is alert.  Mood is flat  She was able to provide her age but needed some cues for dates. Fair insight and awareness.  Still needs cues for higher level tasks and sequencing. Follows simple commands. UE 4/5 prox to distal with inhibition due to pain/sternum. LE: 2+ hf, 3ke, 4/5 adf/afp. Decreased sensation in distal feet/toes.  Skin: Skin is warm and dry. Wound incision healing nicely along chest,  drain/pacer lead site with steristrips /dried blood Psychiatric: She has a normal mood and affect. pleasant   Assessment/Plan: 1. Functional deficits secondary to debility/anoxic encephalopathy which require 3+ hours per day of interdisciplinary therapy in a comprehensive inpatient rehab setting. Physiatrist is providing close team supervision and 24 hour management of active medical problems listed below. Physiatrist and rehab team continue to assess barriers to discharge/monitor patient progress toward functional and medical goals. FIM: FIM - Bathing Bathing Steps Patient Completed: Chest, Right Arm, Left Arm, Abdomen, Front perineal area, Buttocks, Right upper leg, Left upper leg, Right lower leg (including foot), Left lower leg (including foot) Bathing: 5: Supervision: Safety issues/verbal cues  FIM - Upper Body Dressing/Undressing Upper body dressing/undressing steps patient completed: Thread/unthread right sleeve of pullover shirt/dresss, Thread/unthread left sleeve of pullover shirt/dress, Put head through opening of pull over shirt/dress, Pull shirt over trunk Upper body dressing/undressing: 5: Set-up assist to: Obtain clothing/put away FIM - Lower Body Dressing/Undressing Lower body dressing/undressing steps patient completed: Thread/unthread right pants leg, Thread/unthread left pants leg, Pull pants up/down, Don/Doff right sock, Don/Doff left sock, Don/Doff right shoe, Don/Doff left shoe, Fasten/unfasten right shoe, Fasten/unfasten left shoe Lower body dressing/undressing: 5: Supervision: Safety issues/verbal cues  FIM - Toileting Toileting steps completed by patient: Adjust clothing prior to toileting, Performs perineal hygiene, Adjust clothing after toileting Toileting Assistive Devices: Grab bar or rail for support Toileting: 4: Steadying assist  FIM - Diplomatic Services operational officer Devices: Grab bars Toilet Transfers: 4-To toilet/BSC:  Min A (steadying Pt. >  75%), 4-From toilet/BSC: Min A (steadying Pt. > 75%)  FIM - Bed/Chair Transfer Bed/Chair Transfer Assistive Devices: Arm rests Bed/Chair Transfer: 5: Chair or W/C > Bed: Supervision (verbal cues/safety issues), 5: Bed > Chair or W/C: Supervision (verbal cues/safety issues)  FIM - Locomotion: Wheelchair Distance: 150 Locomotion: Wheelchair: 3: Travels 150 ft or more: maneuvers on rugs and over door sills with moderate assistance  (Pt: 50 - 74%) FIM - Locomotion: Ambulation Locomotion: Ambulation Assistive Devices: Other (comment) (no AD) Ambulation/Gait Assistance: 4: Min assist Locomotion: Ambulation: 2: Travels 50 - 149 ft with minimal assistance (Pt.>75%)  Comprehension Comprehension Mode: Auditory Comprehension: 4-Understands basic 75 - 89% of the time/requires cueing 10 - 24% of the time  Expression Expression Mode: Verbal Expression: 5-Expresses basic needs/ideas: With extra time/assistive device  Social Interaction Social Interaction: 5-Interacts appropriately 90% of the time - Needs monitoring or encouragement for participation or interaction.  Problem Solving Problem Solving: 3-Solves basic 50 - 74% of the time/requires cueing 25 - 49% of the time  Memory Memory: 3-Recognizes or recalls 50 - 74% of the time/requires cueing 25 - 49% of the time  Medical Problem List and Plan: 1. Functional deficits secondary to debilitation and probable anoxic encephalopathy after cardiac arrest/sepsis, subsequent mitral valve redo  2. DVT Prophylaxis/Anticoagulation: Chronic Coumadin therapy. INR therapeutic 2.8. Continue 3. Pain Management: Ultram as needed. Monitor pain needs with increased mobility 4. Acute blood loss anemia. hgb climbing---9.0 (may be somewhat volume related though) 5. Neuropsych: This patient is capable of making decisions on her own behalf. 6. Skin/Wound Care: Routine skin checks 7. Fluids/Electrolytes/Nutrition: electrolytes normal. 8. Hypertension/atrial  fibrillation. Amiodarone 200 mg twice a day, Coreg 6.25 mg twice a day, . Good control at present, HR  72-95 bpm  -consider cards follow up pending upon appearance today 9. Diastolic congestive heart failure. Hold Lasix 40 mg twice daily for now.  Monitor for any signs of fluid overload -following weights and i's and o's, weight 67 kg today 9. Acute on chronic renal insufficiency. Baseline creatinine 1.48---- 2.21 today upon lab review. . Renal ultrasound negative.   -given IVF yesterday 1L  -recheck electrolytes again tomorrow  -hold lasix today  -encourage PO 10. Hyperlipidemia. Lipitor 11. Diarrhea: resolved---laxative induced   LOS (Days) 6 A FACE TO FACE EVALUATION WAS PERFORMED  SWARTZ,ZACHARY T 08/09/2014 7:58 AM

## 2014-08-09 NOTE — Progress Notes (Signed)
Occupational Therapy Session Note  Patient Details  Name: [name] MRN: [id number] Date of Birth: 1945-05-14  Today's Date: 08/09/2014 OT Individual Time: 1000-1030 OT Individual Time Calculation (min): 30 min   Short Term Goals: Week 1:  OT Short Term Goal 1 (Week 1): STG=LTG d/t anticipated short LOS  Skilled Therapeutic Interventions/Progress Updates: ADL-retraining with focus on improved self-care through participation in treatment (pt ed).   Pt received supine in bed, in no distress, with HOB elevated.   Pt initially refused treatment but agreeable to self-feeding after redirection of attention to untouched breakfast tray near sink.   Pt able to rise to edge of bed and ambulate to bathroom to void urine prior to self-feeding, and required min assist to change brief while standing at toilet.   Pt rejected encouragement to self-feed supported in w/c and elected to return bed to self-feed using over bed table.   OT attempted re-motivating pt to participate in session, offered alternate plans to include excursion outdoors and/or to gift shop however pt reiterated request to be left alone in room.   During discussion, pt informed therapist of new plan for care of granddaughter and for supervised living with assist from church friend [name] (or [name]?) .   Pt acknowledges memory deficits but remains only partially aware of the severity of her deficits.    Pt also relates suspicion of staff reports as sabotaging her recovery plan to return home, to independent living.   Pt terminated session early and was re-educated on therapy schedule and administrative requirements relating to missed sessions.   OT alerted RN, SW, and PA on pt's refusal for treatment.     Therapy Documentation Precautions:  Precautions Precautions: Sternal, Fall Restrictions Weight Bearing Restrictions: No (sternal precautions)  General: General OT Amount of Missed Time: 60 Minutes   Pain: Pain  Assessment Pain Assessment: No/denies pain Pain Score: 0-No pain  ADL: ADL ADL Comments: see FIM  See FIM for current functional status  Therapy/Group: Individual Therapy  [name] 08/09/2014, 10:57 AM

## 2014-08-09 NOTE — Progress Notes (Signed)
Physical Therapy Session Note  Patient Details  Name: [name] MRN: [id number] Date of Birth: 1945/04/30  Today's Date: 08/09/2014 PT Individual Time: 1415-1500 PT Individual Time Calculation (min): 45 min   Short Term Goals: Week 1:  PT Short Term Goal 1 (Week 1): STGs = LTGs due to ELOS  Skilled Therapeutic Interventions/Progress Updates:    Pt received supine in bed with son present; reluctantly agreeable to treatment, no c/o pain. BP measured 96/66 and pt asymptomatic. Pt requests to use restroom; assisted to/from bathroom with supervision, clothing management and personal hygiene and sitting balance on toilet performed modI. Gait training for 4 trials of 150-175' each with supervision, occasional use of handrail for stability, however no LOB noted. Pt given extended seated rest breaks between trials due to fatigue and as behavior management due to pt unwillingness to participate. Pt states repetitively that she is not interested in doing any activities with therapy because she feels she is at a level where she can go home and be able to do all required activities. Educated pt regarding the role of therapy to improve strength, endurance and safety to allow pt inc independence upon returning home. Pt additionally educated on therapy schedule, expectation of hours of therapy that pt participates in on a daily basis, and requirement to make up any missed time. Pt becomes irritated stating she will do any extra therapy tomorrow not today, and is adamant about being done for the day as she would like to visit with her son. Pt left seated on EOB with son present and all needs within reach at completion of session. Pt missed 30 minutes skilled PT due to pt refusal.   Therapy Documentation Precautions:  Precautions Precautions: Sternal, Fall Restrictions Weight Bearing Restrictions: No (sternal precautions) General: PT Amount of Missed Time (min): 30 Minutes PT Missed Treatment  Reason: Patient unwilling to participate Pain: Pain Assessment Pain Assessment: No/denies pain Pain Score: 0-No pain Locomotion : Ambulation Ambulation/Gait Assistance: 5: Supervision   See FIM for current functional status  Therapy/Group: Individual Therapy  Vista Lawman 08/09/2014, 3:10 PM

## 2014-08-09 NOTE — Progress Notes (Signed)
Social Work Patient ID: [name], female   DOB: 1945-06-11, 69 y.o.   MRN: [id number]  Have reviewed team conference with both pt and daughter (via phone).  Both aware that targeted d/c set for 8/5 and recommendation for 24/7 supervision.  Both reluctantly agreeable to this date and deny any concerns about meeting care needs at home (neither would offer what their plan is for 24/7 care).  Daughter agreeable to coming in tomorrow for family ed from 10-12 (will see OT and ST.)  Daughter also questions "Why so much therapy?  The home therapy and the extended stay there with you all?"  Daughter reports that pt "just wants to come home."  Educated daughter on purpose of CIR and HH, however, also explained that we can discuss earlier d/c following family education tomorrow IF MD in agreement.  Will meet with pt and daughter tomorrow.  Have alerted therapies to pt/ family questions.  Continue to follow.  Tiffiny Worthy, LCSW

## 2014-08-09 NOTE — Patient Care Conference (Signed)
Inpatient RehabilitationTeam Conference and Plan of Care Update Date: 08/09/2014   Time: 2:05 PM    Patient Name: [name]      Medical Record Number: [id number]  Date of Birth: 1945-07-21 Sex: Female         Room/Bed: 4M03C/4M03C-01 Payor Info: Payor: MEDICARE / Plan: MEDICARE PART A AND B / Product Type: *No Product type* /    Admitting Diagnosis: Debility with Cardiac Arrest   Admit Date/Time:  08/03/2014  6:12 PM Admission Comments: No comment available   Primary Diagnosis:  Anoxic encephalopathy Principal Problem: Anoxic encephalopathy  Patient Active Problem List   Diagnosis Date Noted  . Debility 08/04/2014  . Anoxic encephalopathy 08/04/2014  . Diastolic CHF 08/04/2014  . 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 S/P redo mitral valve replacement with bioprosthetic valve 07/25/2014  . Prosthetic valve dysfunction 07/22/2014  . Shock 07/21/2014  . Cardiac arrest 07/21/2014  . Acute respiratory failure, unspecified whether with hypoxia or hypercapnia   . Metabolic acidosis   . RBBB 03/11/2014  . HTN (hypertension) 01/23/2013  . Dyslipidemia 01/23/2013  . S/P mitral valve replacement with a bileaflet mechanical valve 04/11/1998  . Long term current use of anticoagulant therapy for mechanical mitral valve 04/11/1998    Expected Discharge Date: Expected Discharge Date: 08/12/14  Team Members Present: Physician leading conference: Dr. Faith Rogue Social Worker Present: Amada Jupiter, LCSW Nurse Present: Carmie End, RN PT Present: Edman Circle, Grayland Ormond, PT OT Present: Donzetta Kohut, OT;Other (comment) Johnsie Cancel, OT) SLP Present: Feliberto Gottron, SLP Other (Discipline and Name): Ottie Glazier, RN [facility]) PPS Coordinator present : Tora Duck, RN, CRRN     Current Status/Progress Goal Weekly Team Focus  Medical   ongoing cognitive issues, bp low, working on volume mgt,   improve activity tolerance  pain, cv issues, safety awareness    Bowel/Bladder   Continent of bowel and bladder/ LBM 08/07/14  Remain continent of bowel and baldder with min assist   Offer toileting Q2 hours and prn   Swallow/Nutrition/ Hydration             ADL's   supervision overall  supervision overall  family education/activity tolerance, active participation   Mobility   Min A overall; low BP affecting ability to participate in therapy  Supervision overall due to cognitive deficits  Activity tolerance/endurance, gait/balance, safety and cognition   Communication             Safety/Cognition/ Behavioral Observations  Mod-Max A  Min A  awareness, memory, problem solving    Pain   no complaints of pain  less than 3 out of 10   Assess for pain q4 hours and prn   Skin   Chest incision with dermabond. Clean dry intact. Wound to mid abdomen. Dry dressing in place. Clean, Dry, and intact   Prevent infection and skin breakdown.   Assess skin q shift and prn. encourage pressure relief methods to prevent skin breakdown.     Rehab Goals Patient on target to meet rehab goals: Yes *See Care Plan and progress notes for long and short-term goals.  Barriers to Discharge: cognition, safety    Possible Resolutions to Barriers:  supervision at home, continued strength and stamina training    Discharge Planning/Teaching Needs:  on track to meet supervision goals, however, family cannot provide 24/7 supervision beyond first few days after d/c - have stressed need for supervision to daughter  ongoing   Team Discussion:  Likely some  anoxia following MI;  Renal - dry, BP issues affecting balance.  Per SW, family indicates pt close to baseline with cognition.  Currently supervision/ min assist with amb - poor balance.  Requiring assistance to sequence through activities  Very little change/ progress this week and pt becoming frustrated with therapies.  Appears to be "shutting down" on program and "offended" by tasks we are working on.  SW to follow up with  daughter and schedule family ed.  Revisions to Treatment Plan:  None   Continued Need for Acute Rehabilitation Level of Care: The patient requires daily medical management by a physician with specialized training in physical medicine and rehabilitation for the following conditions: Daily direction of a multidisciplinary physical rehabilitation program to ensure safe treatment while eliciting the highest outcome that is of practical value to the patient.: Yes Daily medical management of patient stability for increased activity during participation in an intensive rehabilitation regime.: Yes Daily analysis of laboratory values and/or radiology reports with any subsequent need for medication adjustment of medical intervention for : Neurological problems;Other  Terry Abila 08/09/2014, 3:53 PM

## 2014-08-09 NOTE — Progress Notes (Signed)
Speech Language Pathology Daily Session Note  Patient Details  Name: [name] MRN: [id number] Date of Birth: June 10, 1945  Today's Date: 08/09/2014 SLP Individual Time: 0915-1000 SLP Individual Time Calculation (min): 45 min and Today's Date: 08/09/2014 SLP Missed Time: 15 Minutes Missed Time Reason: Other (Comment)   Short Term Goals: Week 1: SLP Short Term Goal 1 (Week 1): Patient will utilize memory compensatory strategies to recall new, daily information with Mod A multimodal cues.  SLP Short Term Goal 2 (Week 1): Patient will utilize call bell to request assistance in 50% of opportunities with Mod A multimodal cues.  SLP Short Term Goal 3 (Week 1): Patient will complete functional and familiar tasks safely in regards to problem solving with Mod A multimodal cues.  SLP Short Term Goal 4 (Week 1): Patient will demonstrate selective attention in a mildly distracting enviornment for 30 minutes with Min A verbal cues for redirection.  SLP Short Term Goal 5 (Week 1): Patient will utilize external aids for orientaiton with supervision verbal cues.  SLP Short Term Goal 6 (Week 1): Patient will self-monitor and correct errors with verbal and functional tasks with Mod A multimodal cues.   Skilled Therapeutic Interventions: Skilled treatment session focused on cognitive goals. Upon arrival, patient was awake while supine in bed and was agreeable to participate in treatment session but did not want to get out of bed.  SLP facilitated session by providing Max-Total A multimodal cues for anticipatory awareness in regards to d/c planning and for utilization of memory compensatory strategies during a structured recall task.  Patient appeared frustrated throughout session and repeated that she "was done and wanted to go home" several times throughout the session and required encouragement for participation.  Patient left upright in bed with alarm on and all needs within reach. Continue with current  plan of care.    FIM:  Comprehension Comprehension Mode: Auditory Comprehension: 4-Understands basic 75 - 89% of the time/requires cueing 10 - 24% of the time Expression Expression Mode: Verbal Expression: 4-Expresses basic 75 - 89% of the time/requires cueing 10 - 24% of the time. Needs helper to occlude trach/needs to repeat words. Social Interaction Social Interaction: 4-Interacts appropriately 75 - 89% of the time - Needs redirection for appropriate language or to initiate interaction. Problem Solving Problem Solving: 6-Solves complex problems: With extra time Memory Memory: 2-Recognizes or recalls 25 - 49% of the time/requires cueing 51 - 75% of the time  Pain Pain Assessment Pain Assessment: No/denies pain  Therapy/Group: Individual Therapy  [name] 08/09/2014, 2:28 PM

## 2014-08-10 ENCOUNTER — Inpatient Hospital Stay (HOSPITAL_COMMUNITY): Payer: Medicare Other | Admitting: Physical Therapy

## 2014-08-10 ENCOUNTER — Inpatient Hospital Stay (HOSPITAL_COMMUNITY): Payer: Self-pay | Admitting: Physical Therapy

## 2014-08-10 ENCOUNTER — Inpatient Hospital Stay (HOSPITAL_COMMUNITY): Payer: Self-pay | Admitting: Speech Pathology

## 2014-08-10 ENCOUNTER — Inpatient Hospital Stay (HOSPITAL_COMMUNITY): Payer: Self-pay

## 2014-08-10 DIAGNOSIS — I5031 Acute diastolic (congestive) heart failure: Secondary | ICD-10-CM

## 2014-08-10 LAB — CBC
HCT: 27.8 % — ABNORMAL LOW (ref 36.0–46.0)
Hemoglobin: 8.9 g/dL — ABNORMAL LOW (ref 12.0–15.0)
MCH: 27.9 pg (ref 26.0–34.0)
MCHC: 32 g/dL (ref 30.0–36.0)
MCV: 87.1 fL (ref 78.0–100.0)
Platelets: 311 10*3/uL (ref 150–400)
RBC: 3.19 MIL/uL — ABNORMAL LOW (ref 3.87–5.11)
RDW: 13.7 % (ref 11.5–15.5)
WBC: 4.2 10*3/uL (ref 4.0–10.5)

## 2014-08-10 LAB — PROTIME-INR
INR: 3.16 — ABNORMAL HIGH (ref 0.00–1.49)
Prothrombin Time: 31.8 seconds — ABNORMAL HIGH (ref 11.6–15.2)

## 2014-08-10 LAB — BASIC METABOLIC PANEL
Anion gap: 8 (ref 5–15)
BUN: 11 mg/dL (ref 6–20)
CALCIUM: 10.6 mg/dL — AB (ref 8.9–10.3)
CO2: 26 mmol/L (ref 22–32)
Chloride: 104 mmol/L (ref 101–111)
Creatinine, Ser: 2.2 mg/dL — ABNORMAL HIGH (ref 0.44–1.00)
GFR, EST AFRICAN AMERICAN: 26 mL/min — AB (ref >60)
GFR, EST NON AFRICAN AMERICAN: 22 mL/min — AB (ref >60)
GLUCOSE: 123 mg/dL — AB (ref 65–99)
Potassium: 4 mmol/L (ref 3.5–5.1)
Sodium: 138 mmol/L (ref 135–145)

## 2014-08-10 MED ORDER — ASPIRIN 81 MG PO TBEC: 81.0000 mg | DELAYED_RELEASE_TABLET | Freq: Every day | ORAL | Status: DC

## 2014-08-10 MED ORDER — SIMVASTATIN 40 MG PO TABS: 40.0000 mg | ORAL_TABLET | Freq: Every day | ORAL | Status: DC

## 2014-08-10 MED ORDER — PANTOPRAZOLE SODIUM 40 MG PO TBEC: 40.0000 mg | DELAYED_RELEASE_TABLET | Freq: Every day | ORAL | Status: DC

## 2014-08-10 MED ORDER — AMIODARONE HCL 200 MG PO TABS: 200.0000 mg | ORAL_TABLET | Freq: Two times a day (BID) | ORAL | Status: DC

## 2014-08-10 MED ORDER — WARFARIN SODIUM 2 MG PO TABS
2.0000 mg | ORAL_TABLET | Freq: Once | ORAL | Status: DC
  Filled 2014-08-10: qty 1

## 2014-08-10 MED ORDER — TRAMADOL HCL 50 MG PO TABS: 50.0000 mg | ORAL_TABLET | Freq: Two times a day (BID) | ORAL | Status: DC | PRN

## 2014-08-10 MED ORDER — WARFARIN SODIUM 2 MG PO TABS
2.0000 mg | ORAL_TABLET | Freq: Every day | ORAL | Status: DC
  Administered 2014-08-10: 2 mg via ORAL
  Filled 2014-08-10 (×2): qty 1

## 2014-08-10 MED ORDER — CARVEDILOL 6.25 MG PO TABS: 6.2500 mg | ORAL_TABLET | Freq: Two times a day (BID) | ORAL | Status: DC

## 2014-08-10 MED ORDER — SACCHAROMYCES BOULARDII 250 MG PO CAPS: 250.0000 mg | ORAL_CAPSULE | Freq: Two times a day (BID) | ORAL | Status: DC

## 2014-08-10 MED ORDER — POTASSIUM CHLORIDE CRYS ER 20 MEQ PO TBCR: 20.0000 meq | EXTENDED_RELEASE_TABLET | Freq: Two times a day (BID) | ORAL | Status: DC

## 2014-08-10 MED ORDER — FUROSEMIDE 40 MG PO TABS: 40.0000 mg | ORAL_TABLET | Freq: Two times a day (BID) | ORAL | Status: DC

## 2014-08-10 MED ORDER — WARFARIN SODIUM 2 MG PO TABS: 2.0000 mg | ORAL_TABLET | Freq: Every day | ORAL | Status: DC

## 2014-08-10 NOTE — Progress Notes (Signed)
[place] PHYSICAL MEDICINE & REHABILITATION     PROGRESS NOTE    Subjective/Complaints: States she feels better this am. Wants to go home. Needed extensive motivation and coaxing to participate in therapies yesterday  ROS: Pt denies fever, rash/itching, headache, blurred or double vision, nausea, vomiting, abdominal pain, chest pain, shortness of breath, palpitations, dysuria,, neck or back pain, bleeding, anxiety, or depression   Objective: Vital Signs: Blood pressure 143/88, pulse 81, temperature 98.9 F (37.2 C), temperature source Oral, resp. rate 18, weight 66.6 kg (146 lb 13.2 oz), SpO2 100 %. No results found.  Recent Labs  08/08/14 1515  WBC 6.3  HGB 9.0*  HCT 28.0*  PLT 297    Recent Labs  08/08/14 1515 08/09/14 0600  NA 137 135  K 3.7 4.0  CL 103 103  GLUCOSE 147* 96  BUN 13 12  CREATININE 2.35* 2.21*  CALCIUM 10.6* 10.1   CBG (last 3)  No results for input(s): GLUCAP in the last 72 hours.  Wt Readings from Last 3 Encounters:  08/10/14 66.6 kg (146 lb 13.2 oz)  08/03/14 71.714 kg (158 lb 1.6 oz)  03/11/14 68.72 kg (151 lb 8 oz)    Physical Exam:  Constitutional: She appears well-developed.  HENT: oral mucosa pink and moist Head: Normocephalic.  Eyes: EOM are normal.  Neck: Normal range of motion. Neck supple. No thyromegaly present.  Cardiovascular:  Cardiac rate controlled. No murmurs.no LE edema.  Respiratory: Effort normal and breath sounds normal. No respiratory distress.  GI: Soft. Bowel sounds are normal. She exhibits no distension.  Neurological: She is alert.  Mood is flat  She was able to provide her age but needed some cues for dates. Fair insight and awareness.  Still needs cues for higher level tasks and sequencing. Follows simple commands. UE 4/5 prox to distal with inhibition due to pain/sternum. LE: 3hf, 3+ke, 4/5 adf/afp. Decreased sensation in distal feet/toes.  Skin: Skin is warm and dry. Wound incision healing nicely  along chest, drain/pacer lead site with steristrips /dried blood Psychiatric: affect flat, cooperative with me   Assessment/Plan: 1. Functional deficits secondary to debility/anoxic encephalopathy which require 3+ hours per day of interdisciplinary therapy in a comprehensive inpatient rehab setting. Physiatrist is providing close team supervision and 24 hour management of active medical problems listed below. Physiatrist and rehab team continue to assess barriers to discharge/monitor patient progress toward functional and medical goals. FIM: FIM - Bathing Bathing Steps Patient Completed: Chest, Right Arm, Left Arm, Abdomen, Front perineal area, Buttocks, Right upper leg, Left upper leg, Right lower leg (including foot), Left lower leg (including foot) Bathing: 5: Supervision: Safety issues/verbal cues  FIM - Upper Body Dressing/Undressing Upper body dressing/undressing steps patient completed: Thread/unthread right sleeve of pullover shirt/dresss, Thread/unthread left sleeve of pullover shirt/dress, Put head through opening of pull over shirt/dress, Pull shirt over trunk Upper body dressing/undressing: 5: Set-up assist to: Obtain clothing/put away FIM - Lower Body Dressing/Undressing Lower body dressing/undressing steps patient completed: Thread/unthread right pants leg, Thread/unthread left pants leg, Pull pants up/down, Don/Doff right sock, Don/Doff left sock, Don/Doff right shoe, Don/Doff left shoe, Fasten/unfasten right shoe, Fasten/unfasten left shoe Lower body dressing/undressing: 5: Supervision: Safety issues/verbal cues  FIM - Toileting Toileting steps completed by patient: Adjust clothing prior to toileting, Performs perineal hygiene, Adjust clothing after toileting Toileting Assistive Devices: Grab bar or rail for support Toileting: 4: Steadying assist  FIM - Diplomatic Services operational officer Devices: Grab bars Toilet Transfers: 5-To toilet/BSC: Supervision (verbal  cues/safety issues), 5-From toilet/BSC: Supervision (verbal cues/safety issues)  FIM - Banker Devices: Arm rests Bed/Chair Transfer: 6: Supine > Sit: No assist, 5: Bed > Chair or W/C: Supervision (verbal cues/safety issues), 5: Chair or W/C > Bed: Supervision (verbal cues/safety issues)  FIM - Locomotion: Wheelchair Distance: 150 Locomotion: Wheelchair: 0: Activity did not occur FIM - Locomotion: Ambulation Locomotion: Ambulation Assistive Devices: Other (comment) (no AD) Ambulation/Gait Assistance: 5: Supervision Locomotion: Ambulation: 5: Travels 150 ft or more with supervision/safety issues  Comprehension Comprehension Mode: Auditory Comprehension: 4-Understands basic 75 - 89% of the time/requires cueing 10 - 24% of the time  Expression Expression Mode: Verbal Expression: 4-Expresses basic 75 - 89% of the time/requires cueing 10 - 24% of the time. Needs helper to occlude trach/needs to repeat words.  Social Interaction Social Interaction: 5-Interacts appropriately 90% of the time - Needs monitoring or encouragement for participation or interaction.  Problem Solving Problem Solving: 5-Solves basic 90% of the time/requires cueing < 10% of the time  Memory Memory: 2-Recognizes or recalls 25 - 49% of the time/requires cueing 51 - 75% of the time  Medical Problem List and Plan: 1. Functional deficits secondary to debilitation and probable anoxic encephalopathy after cardiac arrest/sepsis, subsequent mitral valve redo   -spoke to patient about the potential of going home early  -will need to see labs this morning and she will need to participate in therapy  -needs outpt follow up as below as well surgery 2. DVT Prophylaxis/Anticoagulation: Chronic Coumadin therapy. INR therapeutic 2.8. Continue 3. Pain Management: Ultram as needed. Monitor pain needs with increased mobility 4. Acute blood loss anemia. hgb climbing---9.0  5. Neuropsych:  This patient is capable of making decisions on her own behalf. 6. Skin/Wound Care: Routine skin checks 7. Fluids/Electrolytes/Nutrition: electrolytes normal. 8. Hypertension/atrial fibrillation. Amiodarone 200 mg twice a day, Coreg 6.25 mg twice a day. reasonable control at present, HR  80's bpm  -outpatient cards follow up 9. Recent diastolic congestive heart failure. Improving-- Holding Lasix 40 mg twice daily for now.    -following weights and i's and o's, weight 67 kg today 9. Acute on chronic renal insufficiency. Baseline creatinine 1.48---- 2.21 yesterday . Renal ultrasound negative.   -given IVF yesterday 1L  -labs pending for today  -held lasix yesterday  -encourage PO  -will need outpt renal f/u 10. Hyperlipidemia. Lipitor 11. Diarrhea: resolved---laxative induced   LOS (Days) 7 A FACE TO FACE EVALUATION WAS PERFORMED  [name] T 08/10/2014 9:19 AM

## 2014-08-10 NOTE — Progress Notes (Signed)
Occupational Therapy Session Note  Patient Details  Name: [name] MRN: [id number] Date of Birth: [birth year]  Today's Date: 08/10/2014 OT Individual Time: 1000-1030 OT Individual Time Calculation (min): 30 min  and Today's Date: 08/10/2014 OT Missed Time: 30 Minutes Missed Time Reason: Patient fatigue;Patient unwilling/refused to participate without medical reason   Short Term Goals: Week 1:  OT Short Term Goal 1 (Week 1): STG=LTG d/t anticipated short LOS  Skilled Therapeutic Interventions/Progress Updates:    Pt seen for OT this morning for family education and discharge planning.  Pt's daughter [employer]. Pt stated she had already bathed and changed clothing and did not need to do it again.  Pt amb without AD to tub room to discuss bathing options at daughter's home.  Tub transfer bench recommended. Pt's daughter stated they would purchase after discharge.  Pt exhibited LOB X 1 but stated it was because we were walking too fast.  Pt's daughter provided HHA when walking back to room.  24 hour supervision recommended after discharge. Pt and daughter verbalzied understanding and stated that supervision would be provided.  Pt and daughter stated they knew patient would continue to progress at a quicker rate once patient in home environment.  Pt denied any memory deficits but she was unable to name the OT she had worked with previously.  Focus on functional amb without AD, cognitive remediation, family education, discharge planning, and safety awareness.   Therapy Documentation Precautions:  Precautions Precautions: Sternal, Fall Restrictions Weight Bearing Restrictions: No General: General OT Amount of Missed Time: 30 Minutes Pain: Pain Assessment Pain Assessment: No/denies pain ADL: ADL ADL Comments: see FIM  See FIM for current functional status  Therapy/Group: Individual Therapy  Rich Brave 08/10/2014, 10:59 AM

## 2014-08-10 NOTE — Progress Notes (Signed)
ANTICOAGULATION CONSULT NOTE - Follow Up Consult  Pharmacy Consult for coumadin Indication: mechanical heart valve  Allergies  Allergen Reactions  . Augmentin [Amoxicillin-Pot Clavulanate]     Patient Measurements: Weight: 146 lb 13.2 oz (66.6 kg) Heparin Dosing Weight:   Vital Signs: Temp: 98.9 F (37.2 C) (08/03 0539) Temp Source: Oral (08/03 0539) BP: 129/82 mmHg (08/03 0539) Pulse Rate: 87 (08/03 0539)  Labs:  Recent Labs  08/08/14 0739 08/08/14 1515 08/09/14 0600  HGB  --  9.0*  --   HCT  --  28.0*  --   PLT  --  297  --   LABPROT 28.8*  --  36.2*  INR 2.77*  --  3.75*  CREATININE  --  2.35* 2.21*    Estimated Creatinine Clearance: 23.4 mL/min (by C-G formula based on Cr of 2.21).   Medications:  Scheduled:  . amiodarone  200 mg Oral BID PC  . antiseptic oral rinse  7 mL Mouth Rinse BID  . aspirin EC  81 mg Oral Daily  . atorvastatin  20 mg Oral q1800  . bisacodyl  10 mg Oral Daily   Or  . bisacodyl  10 mg Rectal Daily  . carvedilol  6.25 mg Oral BID WC  . furosemide  40 mg Oral BID  . pantoprazole  40 mg Oral Daily  . potassium chloride  20 mEq Oral BID  . saccharomyces boulardii  250 mg Oral BID  . Warfarin - Pharmacist Dosing Inpatient   Does not apply q1800   Infusions:    Assessment: 69 yo female with mechanical heart valve is currently on therapeutic coumadin.  INR today is down to 3.16.  Goal of Therapy:  INR 2.5-3.5 Monitor platelets by anticoagulation protocol: Yes   Plan:  - coumadin 2 mg po x1 - INR in am  Carolie Mcilrath, Tsz-Yin 08/10/2014,8:25 AM

## 2014-08-10 NOTE — Progress Notes (Signed)
Social Work  Discharge Note  The overall goal for the admission was met for:   Discharge location: Yes - home with daughter.  Family to provide 24/7 supervision per daughter.  Length of Stay: Yes - actually d/c'd earlier than planned as education completed and family confirms ready for d/c.  Pt requests d/c today.  LOS= 7 days  Discharge activity level: Yes - supervision/ light min assist  Home/community participation: Yes  Services provided included: MD, RD, PT, OT, SLP, RN, Pharmacy and [place]: Medicare and Private Insurance: [employer]  Follow-up services arranged: Home Health: PT, ST, RN via [facility] and Patient/Family has no preference for HH/DME agencies  Comments (or additional information):  Patient/Family verbalized understanding of follow-up arrangements: Yes  Individual responsible for coordination of the follow-up plan: pt/dtr  Confirmed correct DME delivered: NA    Zailynn Brandel

## 2014-08-10 NOTE — Plan of Care (Signed)
Problem: RH Ambulation Goal: LTG Patient will ambulate in community environment (PT) LTG: Patient will ambulate in community environment, # of feet with assistance (PT).  Outcome: Not Met (add Reason) Did not meet due to early D/C  Problem: RH Wheelchair Mobility Goal: LTG Patient will propel w/c in controlled environment (PT) LTG: Patient will propel wheelchair in controlled environment, # of feet with assist (PT)  Outcome: Not Met (add Reason) Did not meet due to sternal precautions; pt ambulatory Goal: LTG Patient will propel w/c in community environment (PT) LTG: Patient will propel wheelchair in community environment, # of feet with assist (PT)  Outcome: Not Met (add Reason) Not met due to sternal precautions; pt ambulatory and family refusing need for w/c for community

## 2014-08-10 NOTE — Progress Notes (Signed)
Physical Therapy Session Note  Patient Details  Name: [name] MRN: [id number] Date of Birth: 07/22/1945  Today's Date: 08/10/2014 PT Individual Time: 0800-0900 PT Individual Time Calculation (min): 60 min MAKEUP SESSION  Short Term Goals: Week 1:  PT Short Term Goal 1 (Week 1): STGs = LTGs due to ELOS  Skilled Therapeutic Interventions/Progress Updates:   Makeup session focused on dynamic balance, generalized strengthening, and activity tolerance. Patient agreeable to participate with coaxing, initially stating, "I can't do anything until my daughter gets here." Educated/reviewed rehab schedule with patient. Gait without AD to/from therapy gym 2 x 150 ft with supervision. Patient demonstrates decreased gait speed with decreased stride length and absent trunk rotation/reciprocal arm swing. Performed FGA with score of 11/30, indicating patient is at risk for falls. Stair negotiation up/down four 6" steps using light fingertip support on B rails with step-to pattern and close supervision. NuStep using BLE only at level 3 x 10 min with 2 rest breaks. Attempted to utilize Borg perceived exertion scale but patient with increased difficulty understanding scale. Patient required frequent seated rest breaks due to fatigue and SOB.    Functional Gait Assessment (FGA) Requirements: A marked 6-m (20-ft) walkway that is marked with a 30.48-cm (12-in) width.  _1_ 1. GAIT LEVEL SURFACE Instructions: Walk at your normal speed from here to the next mark (6 m[20 ft]). Grading: Loraine Leriche the highest category that applies. (3) Normal-Walks 6 m (20 ft) in less than 5.5 seconds, no assistive devices, good speed, no evidence for imbalance, normal gait pattern, deviates no more than 15.24 cm (6 in) outside of the 30.48-cm (12-in) walkway width. (2) Mild impairment-Walks 6 m (20 ft) in less than 7 seconds but greater than 5.5 seconds, uses assistive device, slower speed, mild gait deviations, or deviates  15.24-25.4 cm (6-10 in) outside of the 30.48-cm (12-in) walkway width. (1) Moderate impairment-Walks 6 m (20 ft), slow speed, abnormal gait pattern, evidence for imbalance, or deviates 25.4-38.1 cm (10-15 in) outside of the 30.48-cm (12-in) walkway width. Requires more than 7 seconds to ambulate 6 m (20 ft). (0) Severe impairment-Cannot walk 6 m (20 ft) without assistance,severe gait deviations or imbalance, deviates greater than 38.1 cm (15 in) outside of the 30.48-cm (12-in) walkway width or reaches and touches the wall.  _0_ 2. CHANGE IN GAIT SPEED Instructions: Begin walking at your normal pace (for 1.5 m [5 ft]). When I tell you "go," walk as fast as you can (for 1.5 m [5 ft]). When I tell you "slow," walk as slowly as you can (for 1.5 m [5 ft]). Grading: Loraine Leriche the highest category that applies. (3) Normal-Able to smoothly change walking speed without loss of balance or gait deviation. Shows a significant difference in walking speeds between normal, fast, and slow speeds. Deviates no more than 15.24 cm (6 in) outside of the 30.48-cm (12-in) walkway width. (2) Mild impairment-Is able to change speed but demonstrates mild gait deviations, deviates 15.24-25.4 cm (6-10 in) outside of the 30.48-cm (12-in) walkway width, or no gait deviations but unable to achieve a significant change in velocity, or uses an assistive device. (1) Moderate impairment-Makes only minor adjustments to walking speed, or accomplishes a change in speed with significant gait deviations, deviates 25.4-38.1 cm (10-15 in) outside the 30.48-cm (12-in) walkway width, or changes speed but loses balance but is able to recover and continue walking. (0) Severe impairment-Cannot change speeds, deviates greater than 38.1 cm (15 in) outside 30.48-cm (12-in) walkway width, or loses balance and  has to reach for wall or be caught.  _2_ 3. GAIT WITH HORIZONTAL HEAD TURNS Instructions: Walk from here to the next mark 6 m (20 ft) away. Begin  walking at your normal pace. Keep walking straight; after 3 steps, turn your head to the right and keep walking straight while looking to the right. After 3 more steps, turn your head to the left and keep walking straight while looking left. Continue alternating looking right and left every 3 steps until you have completed 2 repetitions in each direction. Grading: Loraine Leriche the highest category that applies. (3) Normal-Performs head turns smoothly with no change in gait. Deviates no more than 15.24 cm (6 in) outside 30.48-cm (12-in) walkway width. (2) Mild impairment-Performs head turns smoothly with slight change in gait velocity (eg, minor disruption to smooth gait path), deviates 15.24-25.4 cm (6-10 in) outside 30.48-cm (12-in) walkway width, or uses an assistive device.  (1) Moderate impairment-Performs head turns with moderate change in gait velocity, slows down, deviates 25.4-38.1 cm (10-15 in) outside 30.48-cm (12-in) walkway width but recovers, can continue to walk. (0) Severe impairment-Performs task with severe disruption of gait (eg, staggers 38.1 cm [15 in] outside 30.48-cm (12-in) walkway width, loses balance, stops, or reaches for wall).  _2_ 4. GAIT WITH VERTICAL HEAD TURNS Instructions: Walk from here to the next mark (6 m [20 ft]). Begin walking at your normal pace. Keep walking straight; after 3 steps, tip your head up and keep walking straight while looking up. After 3 more steps, tip your head down, keep walking straight while looking down. Continue alternating looking up and down every 3 steps until you have completed 2 repetitions in each direction. Grading: Loraine Leriche the highest category that applies. (3) Normal-Performs head turns with no change in gait. Deviates no more than 15.24 cm (6 in) outside 30.48-cm (12-in) walkway width. (2) Mild impairment-Performs task with slight change in gait velocity (eg, minor disruption to smooth gait path), deviates 15.24-25.4 cm (6-10 in) outside 30.48-cm  (12-in) walkway width or uses assistive device. (1) Moderate impairment-Performs task with moderate change in gait velocity, slows down, deviates 25.4-38.1 cm (10-15 in) outside 30.48-cm (12-in) walkway width but recovers, can continue to walk. (0) Severe impairment-Performs task with severe disruption of gait (eg, staggers 38.1 cm [15 in] outside 30.48-cm (12-in) walkway width, loses balance, stops, reaches for wall).  _1_ 5. GAIT AND PIVOT TURN Instructions: Begin with walking at your normal pace. When I tell you, "turn and stop," turn as quickly as you can to face the opposite direction and stop. Grading: Loraine Leriche the highest category that applies. (3) Normal-Pivot turns safely within 3 seconds and stops quickly with no loss of balance. (2) Mild impairment-Pivot turns safely in _3 seconds and stops with no loss of balance, or pivot turns safely within 3 seconds and stops with mild imbalance, requires small steps to catch balance. (1) Moderate impairment-Turns slowly, requires verbal cueing, or requires several small steps to catch balance following turn and stop. (0) Severe impairment-Cannot turn safely, requires assistance to turn and stop.  _1_ 6. STEP OVER OBSTACLE Instructions: Begin walking at your normal speed. When you come to the shoe box, step over it, not around it, and keep walking. Grading: Loraine Leriche the highest category that applies. (3) Normal-Is able to step over 2 stacked shoe boxes taped together (22.86 cm [9 in] total height) without changing gait speed; no evidence of imbalance. (2) Mild impairment-Is able to step over one shoe box (11.43 cm [4.5 in] total  height) without changing gait speed; no evidence of imbalance. (1) Moderate impairment-Is able to step over one shoe box (11.43 cm [4.5 in] total height) but must slow down and adjust steps to clear box safely. May require verbal cueing. (0) Severe impairment-Cannot perform without assistance.  _0_ 7. GAIT WITH NARROW BASE OF  SUPPORT Instructions: Walk on the floor with arms folded across the chest, feet aligned heel to toe in tandem for a distance of 3.6 m [12 ft]. The number of steps taken in a straight line are counted for a maximum of 10 steps. Grading: Loraine Leriche the highest category that applies. (3) Normal-Is able to ambulate for 10 steps heel to toe with no staggering. (2) Mild impairment-Ambulates 7-9 steps. (1) Moderate impairment-Ambulates 4-7 steps. (0) Severe impairment-Ambulates less than 4 steps heel to toe or cannot perform without assistance.  _1_ 8. GAIT WITH EYES CLOSED Instructions: Walk at your normal speed from here to the next mark (6 m [20 ft]) with your eyes closed. Grading: Loraine Leriche the highest category that applies. (3) Normal-Walks 6 m (20 ft), no assistive devices, good speed, no evidence of imbalance, normal gait pattern, deviates no more than 15.24 cm (6 in) outside 30.48-cm (12-in) walkway width. Ambulates 6 m (20 ft) in less than 7 seconds. (2) Mild impairment-Walks 6 m (20 ft), uses assistive device, slower speed, mild gait deviations, deviates 15.24-25.4 cm (6-10 in) outside 30.48-cm (12-in) walkway width. Ambulates 6 m (20 ft) in less than 9 seconds but greater than 7 seconds. (1) Moderate impairment-Walks 6 m (20 ft), slow speed, abnormal gait pattern, evidence for imbalance, deviates 25.4-38.1 cm (10-15 in) outside 30.48-cm (12-in) walkway width. Requires more than 9 seconds to ambulate 6 m (20 ft). (0) Severe impairment-Cannot walk 6 m (20 ft) without assistance, severe gait deviations or imbalance, deviates greater than 38.1 cm (15 in) outside 30.48-cm (12-in) walkway width or will not attempt task.  _2_ 9. AMBULATING BACKWARDS Instructions: Walk backwards until I tell you to stop. Grading: Loraine Leriche the highest category that applies. (3) Normal-Walks 6 m (20 ft), no assistive devices, good speed, no evidence for imbalance, normal gait pattern, deviates no more than 15.24 cm (6 in) outside  30.48-cm (12-in) walkway width. (2) Mild impairment-Walks 6 m (20 ft), uses assistive device, slower speed, mild gait deviations, deviates 15.24-25.4 cm (6-10 in) outside 30.48-cm (12-in) walkway width. (1) Moderate impairment-Walks 6 m (20 ft), slow speed, abnormal gait pattern, evidence for imbalance, deviates 25.4-38.1 cm (10-15 in) outside 30.48-cm (12-in) walkway width. (0) Severe impairment-Cannot walk 6 m (20 ft) without assistance, severe gait deviations or imbalance, deviates greater than 38.1 cm (15 in) outside 30.48-cm (12-in) walkway width or will not attempt task.  _1_ 10. STEPS Instructions: Walk up these stairs as you would at home (ie, using the rail if necessary). At the top turn around and walk down. Grading: Loraine Leriche the highest category that applies. (3) Normal-Alternating feet, no rail. (2) Mild impairment-Alternating feet, must use rail. (1) Moderate impairment-Two feet to a stair; must use rail. (0) Severe impairment-Cannot do safely.  TOTAL SCORE: __11____ /30 (MAXIMUM SCORE=30)  Scores of ? 22/30 on the FGA were found to be effective in predicting falls, Sensitivity 85%, Specificity 86% Scores of ? 20/30 on the FGA were optimal to predict older adults residing in community dwellings who would sustain unexplained falls in the next 6 months, Sensitivity 100%, Specificity 76% Alvino Chapel & Lucianne Muss, 2010; aged 23 to 67, Older Adults) MDC: 4.2 points for CVA Juel Burrow et  al, 2010) MCID: 8 points for Balance and Vestibular Disorders Levander Campion and Juel Burrow, 2014)   Therapy Documentation Precautions:  Precautions Precautions: Sternal, Fall Restrictions Weight Bearing Restrictions: No Vital Signs: Therapy Vitals Temp: 98.9 F (37.2 C) Temp Source: Oral Pulse Rate: 81 Resp: 18 BP: (!) 143/88 mmHg Patient Position (if appropriate): Sitting (after ambulation) Oxygen Therapy SpO2: 100 % O2 Device: Not Delivered Pain: Pain Assessment Pain Assessment: No/denies pain  See FIM for  current functional status  Therapy/Group: Individual Therapy  Kerney Elbe 08/10/2014, 8:50 AM

## 2014-08-10 NOTE — Discharge Summary (Signed)
Discharge summary job # [id number]

## 2014-08-10 NOTE — Discharge Instructions (Signed)
Inpatient Rehab Discharge Instructions  [name] Discharge date and time: No discharge date for patient encounter.   Activities/Precautions/ Functional Status: Activity: activity as tolerated Diet: regular diet Wound Care: none needed Functional status:  ___ No restrictions     ___ Walk up steps independently _x__ 24/7 supervision/assistance   ___ Walk up steps with assistance ___ Intermittent supervision/assistance  ___ Bathe/dress independently ___ Walk with walker     ___ Bathe/dress with assistance ___ Walk Independently    ___ Shower independently _x__ Walk with assistance    ___ Shower with assistance ___ No alcohol     ___ Return to work/school ________    COMMUNITY REFERRALS UPON DISCHARGE:  Home Health:   PT      ST    RN                    Agency:  [facility] Phone: [phone]     Special Instructions: Home health nurse to check INR on  08/15/2014     results to Dr. Rennis Golden [phone]   My questions have been answered and I understand these instructions. I will adhere to these goals and the provided educational materials after my discharge from the hospital.  Patient/Caregiver Signature _______________________________ Date __________  Clinician Signature _______________________________________ Date __________  Please bring this form and your medication list with you to all your follow-up doctor's appointments.

## 2014-08-10 NOTE — Progress Notes (Signed)
Speech Language Pathology Daily Session Note  Patient Details  Name: [name] MRN: [id number] Date of Birth: [birth year]  Today's Date: 08/10/2014 SLP Individual Time: 1100-1130 SLP Individual Time Calculation (min): 30 min  Short Term Goals: Week 1: SLP Short Term Goal 1 (Week 1): Patient will utilize memory compensatory strategies to recall new, daily information with Mod A multimodal cues.  SLP Short Term Goal 2 (Week 1): Patient will utilize call bell to request assistance in 50% of opportunities with Mod A multimodal cues.  SLP Short Term Goal 3 (Week 1): Patient will complete functional and familiar tasks safely in regards to problem solving with Mod A multimodal cues.  SLP Short Term Goal 4 (Week 1): Patient will demonstrate selective attention in a mildly distracting enviornment for 30 minutes with Min A verbal cues for redirection.  SLP Short Term Goal 5 (Week 1): Patient will utilize external aids for orientaiton with supervision verbal cues.  SLP Short Term Goal 6 (Week 1): Patient will self-monitor and correct errors with verbal and functional tasks with Mod A multimodal cues.   Skilled Therapeutic Interventions: Skilled treatment session focused on patient and family education. Upon arrival, patient requested to use the bathroom. Patient ambulated to bathroom with supervision and was Mod I for self-care tasks. SLP facilitated session by providing education to the patient and her daughter in regards to the patient's current cognitive-linguistic function and strategies to utilize to maximize recall, carryover, attention, problem solving and overall safety. Also reinforced that therapy team is recommending 24 hour supervision, family verbalized this will be provided. Both verbalized understanding of all information and daughter asked appropriate questions. A handout was also given to reinforce information. Patient left in bed with family present.    FIM:   Comprehension Comprehension Mode: Auditory Comprehension: 4-Understands basic 75 - 89% of the time/requires cueing 10 - 24% of the time Expression Expression Mode: Verbal Expression: 4-Expresses basic 75 - 89% of the time/requires cueing 10 - 24% of the time. Needs helper to occlude trach/needs to repeat words. Social Interaction Social Interaction: 4-Interacts appropriately 75 - 89% of the time - Needs redirection for appropriate language or to initiate interaction. Problem Solving Problem Solving: 4-Solves basic 75 - 89% of the time/requires cueing 10 - 24% of the time Memory Memory: 3-Recognizes or recalls 50 - 74% of the time/requires cueing 25 - 49% of the time FIM - Eating Eating Activity: 6: Assistive device: dentures  Pain Pain Assessment Pain Assessment: No/denies pain  Therapy/Group: Individual Therapy  Juliene Kirsh 08/10/2014, 11:58 AM

## 2014-08-10 NOTE — Plan of Care (Signed)
Problem: RH BOWEL ELIMINATION Goal: RH STG MANAGE BOWEL WITH ASSISTANCE STG Manage Bowel with Supervision Assistance.  Outcome: Not Met (add Reason) takingbiscodyltabsforBMdaily  Problem: RH BLADDER ELIMINATION Goal: RH STG MANAGE BLADDER WITH ASSISTANCE STG Manage Bladder With Supervision Assistance  Outcome: Completed/Met Date Met:  08/10/14 usingdiaperhoweverptisindependentwiththis     

## 2014-08-10 NOTE — Plan of Care (Signed)
Problem: RH Car Transfers Goal: LTG Patient will perform car transfers with assist (PT) LTG: Patient will perform car transfers with assistance (PT).  Outcome: Not Met (add Reason) Did not meet goal due to early D/C and family denying need for education; pt was supervision for basic transfers.

## 2014-08-10 NOTE — Progress Notes (Signed)
Dischargedtohomeaccommpaniedbyson.Dtrandptgivend/cinfobyDan[place]PACandRNreviewedmedstogiventhisafternoon.Coumadindosegivenbeforedischargeandfamilyaware.Noquestionsnoted.Pamelia Hoit

## 2014-08-10 NOTE — Progress Notes (Addendum)
Physical Therapy Discharge Summary  Patient Details  Name: [name] MRN: [id number] Date of Birth: 1945/05/23   Patient has met 8 of 12 long term goals due to improved activity tolerance, improved balance and improved postural control.  Patient to discharge at an ambulatory level Supervision.   Patient's care partner refused formal education with PT but verbalized understanding to provide the necessary 24/7 cognitive and supervision assistance at discharge.  Reasons goals not met: Did not meet car transfer, community ambulation or w/c mobility goals.  Pt D/C'd early from CIR with family and patient denying need to practice with therapy for car transfer training and community ambulation training.  Pt did not meet w/c mobility goals due to sternal precautions and family denying need for w/c in community for endurance.  Recommendation:  Patient will benefit from ongoing skilled PT services in home health setting to continue to advance safe functional mobility, address ongoing impairments in activity tolerance/endurance, balance, cognition/safety, and minimize fall risk.   See FIM for current functional status  [name] [facility] 08/10/2014, 4:13 PM

## 2014-08-11 NOTE — Progress Notes (Signed)
Occupational Therapy Discharge Summary  Patient Details  Name: [name] MRN: [id number] Date of Birth: 1945-05-26   Patient has met 37 of 16 long term goals due to improved activity tolerance, improved balance and ability to compensate for deficits.  Patient to discharge at overall Supervision level.  Patient's care partner is independent to provide the necessary cognitive assistance at discharge to supervise pt's attempt at homemaking when there is a need to sequence complex functional tasks or alternate or divide her attention between task components as in attending to laundry or meal prep.   Pt demonstrated some improvement with attention and reported awareness of memory deficits however remained moderately impaired with her short-term memory at discharge and requires 24/7 supervision for safety due to residual intermittent disorientation and frequent need for rest breaks d/t poor endurance.  Reasons goals not met: Pt and family reported plan to allied health team to provide assist with homemaking tasks as needed at discharge.  Recommendation:  Patient will benefit from ongoing skilled OT services in home health setting to continue to advance functional skills in the area of iADL and Reduce care partner burden.  Equipment: No equipment provided  Reasons for discharge: discharge from hospital  Patient/family agrees with progress made and goals achieved: Yes  OT Discharge Precautions/Restrictions  Precautions Precautions: Sternal;Fall Restrictions Weight Bearing Restrictions: No Other Position/Activity Restrictions: Limited use of BUE during transfers (WB through arms) due to sternal precautions  Pain Pain Assessment Pain Assessment: No/denies pain  ADL ADL ADL Comments: see FIM   Vision/Perception  Vision- History Baseline Vision/History: Wears glasses Wears Glasses: Reading only Patient Visual Report: No change from baseline Perception Comments:  Demonstrated deficits with naming objects within context of treatment session Praxis Praxis-Other Comments: difficult to assess d/t disengaging from therapies (question behavioral versus compensatory avoidance)   Cognition Overall Cognitive Status: Impaired/Different from baseline Arousal/Alertness: Awake/alert Orientation Level: Oriented to place;Oriented to person;Disoriented to time;Oriented to situation Focused Attention: Appears intact Sustained Attention: Appears intact Selective Attention: Impaired Selective Attention Impairment: Functional basic Memory: Impaired Memory Impairment: Storage deficit;Retrieval deficit;Decreased recall of new information Awareness: Impaired Awareness Impairment: Intellectual impairment Problem Solving: Impaired Problem Solving Impairment: Functional basic Executive Function: Organizing;Decision Making;Sequencing;Reasoning;Self Monitoring;Self Correcting Reasoning: Impaired Reasoning Impairment: Functional basic Sequencing: Impaired Sequencing Impairment: Functional basic Organizing: Impaired Decision Making Impairment: Functional basic Self Monitoring: Impaired Self Monitoring Impairment: Functional basic;Verbal basic Self Correcting: Impaired Self Correcting Impairment: Verbal basic;Functional basic Behaviors: Poor frustration tolerance Safety/Judgment: Impaired Comments: flat affect    Sensation Sensation Light Touch: Appears Intact Stereognosis: Appears Intact Hot/Cold: Appears Intact Proprioception: Appears Intact Coordination Gross Motor Movements are Fluid and Coordinated: Yes Fine Motor Movements are Fluid and Coordinated: Yes   Motor  Motor Motor: Within Functional Limits   Mobility  Bed Mobility Rolling Right: 6: Modified independent (Device/Increase time) Right Sidelying to Sit: 6: Modified independent (Device/Increase time) Supine to Sit: 6: Modified independent (Device/Increase time) Sitting - Scoot to Edge of Bed:  6: Modified independent (Device/Increase time) Transfers Transfers: Sit to Stand;Stand to Sit Sit to Stand: 6: Modified independent (Device/Increase time) Stand to Sit: 6: Modified independent (Device/Increase time)    Trunk/Postural Assessment  Cervical Assessment Cervical Assessment: Within Functional Limits Thoracic Assessment Thoracic Assessment: Within Functional Limits Lumbar Assessment Lumbar Assessment: Within Functional Limits Postural Control Postural Control: Within Functional Limits   Balance Dynamic Sitting Balance Dynamic Sitting - Balance Support: Feet supported Dynamic Sitting - Level of Assistance: 6: Modified independent (Device/Increase time) Static Standing Balance Static Standing -  Balance Support: During functional activity Dynamic Standing Balance Dynamic Standing - Balance Support: During functional activity Dynamic Standing - Level of Assistance: 5: Stand by assistance   Extremity/Trunk Assessment RUE Assessment RUE Assessment: Exceptions to [facility] RUE AROM (degrees) Overall AROM Right Upper Extremity: Deficits;Due to precautions RUE Overall AROM Comments: arm flexion tested to 90 degrees; elbows & grip wfl RUE Strength RUE Overall Strength: Deficits;Due to precautions LUE Assessment LUE Assessment: Exceptions to WFL LUE AROM (degrees) Overall AROM Left Upper Extremity: Deficits;Due to precautions LUE Overall AROM Comments: armflexion to 90 degrees; elbow & grip wfl LUE Strength LUE Overall Strength: Deficits;Due to precautions  See FIM for current functional status  Quintrell Baze 08/11/2014, 2:34 PM

## 2014-08-11 NOTE — Progress Notes (Signed)
Speech Language Pathology Discharge Summary  Patient Details  Name: [name] MRN: [id number] Date of Birth: 09/28/1945  Patient has met 4 of 5 long term goals.  Patient to discharge at overall Mod level.   Reasons goals not met: Patient continues to require Max A multimodal cues for emergent awareness of errors with functional tasks.    Clinical Impression/Discharge Summary: Patient has made minimal gains and has met 4 of 5 LTG's this admission due to increased orientation and recall with use of external aids, functional problem solving and selective attention.  Currently, patient requires overall Mod A multimodal cues for recall of basic and familiar information and Min A multimodal cues for functional problem solving with basic and familiar tasks and functional problem solving.  Patient continues to require Max-total A multimodal cues for emergent awareness of errors with tasks, therefore, 24 hour supervision is recommended. Patient/family education completed in regards to strategies to utilize at home to maximize recall, orientation, problem solving, attention and overall safety. Both verbalized understanding and handouts were also given to reinforce information. Patient's daughter verbalized that someone will be home with the patient at all times and requested to discharge home earlier than recommended.  Patient would benefit from f/u SLP services to maximize cognitive function and overall functional independence in order to reduce caregiver burden.   Care Partner:  Caregiver Able to Provide Assistance: Yes  Type of Caregiver Assistance: Physical;Cognitive  Recommendation:  Home Health SLP;24 hour supervision/assistance  Rationale for SLP Follow Up: Reduce caregiver burden;Maximize cognitive function and independence   Equipment: N/A   Reasons for discharge: Discharged from hospital   Patient/Family Agrees with Progress Made and Goals Achieved: Yes   See FIM for current  functional status  ,  08/11/2014, 7:10 AM    

## 2014-08-11 NOTE — Discharge Summary (Signed)
[name], [name]       ACCOUNT NO.:  [id number]  MEDICAL RECORD NO.:  [id number]  LOCATION:  4M03C                        FACILITY:  [facility]  PHYSICIAN:  Ranelle Oyster, M.D.DATE OF BIRTH:  March 31, 1945  DATE OF ADMISSION:  08/03/2014 DATE OF DISCHARGE:  08/10/2014                              DISCHARGE SUMMARY   DISCHARGE DIAGNOSES: 1. Functional deficit secondary to debilitation and probable anoxic     encephalopathy after cardiac arrest with subsequent mitral valve re-     do. 2. Chronic Coumadin therapy. 3. Pain management. 4. Acute blood loss anemia. 5. Hypertension with atrial fibrillation. 6. Recent diastolic congestive heart failure. 7. Acute-on-chronic renal insufficiency with baseline creatinine 1.48     to 2.21. 8. Hyperlipidemia.  HISTORY OF PRESENT ILLNESS:  This is a 69 year old right-handed female with remote history of endocarditis requiring St. Jude mechanical valve by Dr. Cornelius Moras in 2000, maintained on chronic Coumadin who was independent, living alone prior to admission.  Presented on July 21, 2014, with altered mental status, generalized weakness with witnessed PEA arrest in the ED with bradycardia into the 20s.  Noted creatinine 3.57.  Troponin 0.95.  EKG showed right bundle-branch block and left anterior fascicular block consistent with previous EKG from March 11, 2014.  Renal ultrasound with no hydronephrosis.  Cardiology Service was consulted for septic shock, cardiac arrest, required full intubation.  TEE completed showing ejection fraction 55%.  Motions of the leaflets of the mechanical valve in the mitral position appeared restricted.  Underwent redo mitral valve replacement on July 25, 2014, per Dr. Cornelius Moras.  Renal function slowly improved 2.08, felt renal insufficiency related to acute decompensated congestive heart failure per Nephrology Services.  She remained on chronic Coumadin.  Acute blood loss anemia 8.5.  She was extubated on July 26, 2014.  Physical and occupational therapy ongoing.  The patient was admitted for comprehensive rehab program.  PAST MEDICAL HISTORY:  See discharge diagnoses.  SOCIAL HISTORY:  Lives alone, independent prior to admission. Functional status upon admission to rehab services was moderate assist, ambulate 60 feet rolling walker; min assist, sit to stand; min-to-mod assist, activities of daily living.  PHYSICAL EXAMINATION:  VITAL SIGNS:  Blood pressure 115/63, pulse 90, temperature 98, respirations 18. GENERAL:  This was an alert female, flat affect.  Made good eye contact with examiner.  She was able to provide her age, fair awareness of her deficits. LUNGS:  Clear to auscultation. CARDIAC:  Rate controlled. ABDOMEN:  Soft, nontender.  Good bowel sounds.  REHABILITATION HOSPITAL COURSE:  Patient was admitted to inpatient rehab services with therapies initiated on a 3-hour daily basis consisting of physical therapy, occupational therapy, and rehabilitation nursing.  The following issues were addressed during patient's rehabilitation stay. Pertaining to [name]'s debilitation related to cardiac arrest, suspect anoxic encephalopathy, remained stable.  Family felt she was essentially at her neurologic baseline.  She remained on chronic Coumadin.  INR of 3.16.  She would remain on 2 mg of Coumadin and follow up with Dr. Rennis Golden for ongoing management of Coumadin therapy, atrial fibrillation, [phone].  A home health nurse had been arranged for necessary blood draws until she followed up with Cardiology.  Noted acute-on-chronic renal insufficiency, baseline creatinine  1.48 to 2.21. Latest creatinine 2.20.  Her Lasix was held for 1 day resumed at current dose of 40 mg twice daily.  She would follow up with Nephrology Services.  Her blood pressure is well controlled.  No chest pain or shortness of breath.  She remained on amiodarone 200 mg twice daily. The patient received weekly  collaborative interdisciplinary team conferences to discuss estimated length of stay, family teaching, and any barriers to discharge.  She is ambulating 150 feet x2 with supervision without assistive device demonstrates decreased gait with decreased stride length, stair negotiation, up and down 6 steps with supervision.  She was able to gather her belongings for activities of daily living and homemaking.  Full family teaching was completed.  Plan was discharged to home with 24-hour supervision.  DISCHARGE MEDICATIONS: 1. Amiodarone 200 mg twice daily. 2. Aspirin 81 mg daily. 3. Lipitor 20 mg daily. 4. Coreg 6.25 mg b.i.d. 5. Lasix 40 mg b.i.d. 6. Potassium chloride 20 mEq b.i.d. 7. Florastor 250 mg b.i.d. 8. Ultram 50 mg every 6 hours as needed pain dispense of 60 tablets. 9. Coumadin 2 mg daily.  DIET:  Regular.  FOLLOWUP:  She would follow up with Dr. Rennis Golden, Cardiology Services call, for appointment; Dr. Terrial Rhodes, Renal Services 2 weeks; Dr. Faith Rogue as needed; Dr. Tressie Stalker, call for appointment.  Home health nurse had been arranged to draw prothrombin time on August 15, 2014, results to Dr. Rennis Golden, [phone].     Mariam Dollar, P.A.   ______________________________ Ranelle Oyster, M.D.    DA/MEDQ  D:  08/10/2014  T:  08/11/2014  Job:  [id number]  cc:   Terrial Rhodes, M.D. Dr. Candis Schatz H. Cornelius Moras, M.D.

## 2014-08-13 DIAGNOSIS — Z7901 Long term (current) use of anticoagulants: Secondary | ICD-10-CM | POA: Diagnosis not present

## 2014-08-13 DIAGNOSIS — Z8674 Personal history of sudden cardiac arrest: Secondary | ICD-10-CM | POA: Diagnosis not present

## 2014-08-13 DIAGNOSIS — T82867D Thrombosis of cardiac prosthetic devices, implants and grafts, subsequent encounter: Secondary | ICD-10-CM | POA: Diagnosis not present

## 2014-08-13 DIAGNOSIS — Z952 Presence of prosthetic heart valve: Secondary | ICD-10-CM | POA: Diagnosis not present

## 2014-08-13 DIAGNOSIS — I4891 Unspecified atrial fibrillation: Secondary | ICD-10-CM | POA: Diagnosis not present

## 2014-08-13 DIAGNOSIS — E785 Hyperlipidemia, unspecified: Secondary | ICD-10-CM | POA: Diagnosis not present

## 2014-08-13 DIAGNOSIS — I5030 Unspecified diastolic (congestive) heart failure: Secondary | ICD-10-CM | POA: Diagnosis not present

## 2014-08-13 DIAGNOSIS — Z5181 Encounter for therapeutic drug level monitoring: Secondary | ICD-10-CM | POA: Diagnosis not present

## 2014-08-16 ENCOUNTER — Ambulatory Visit (INDEPENDENT_AMBULATORY_CARE_PROVIDER_SITE_OTHER): Payer: Medicare Other | Admitting: Pharmacist Clinician (PhC)/ Clinical Pharmacy Specialist

## 2014-08-16 ENCOUNTER — Telehealth: Payer: Self-pay | Admitting: Internal Medicine

## 2014-08-16 DIAGNOSIS — Z954 Presence of other heart-valve replacement: Secondary | ICD-10-CM | POA: Diagnosis not present

## 2014-08-16 DIAGNOSIS — T82867D Thrombosis of cardiac prosthetic devices, implants and grafts, subsequent encounter: Secondary | ICD-10-CM | POA: Diagnosis not present

## 2014-08-16 DIAGNOSIS — I5030 Unspecified diastolic (congestive) heart failure: Secondary | ICD-10-CM | POA: Diagnosis not present

## 2014-08-16 DIAGNOSIS — Z7901 Long term (current) use of anticoagulants: Secondary | ICD-10-CM | POA: Diagnosis not present

## 2014-08-16 DIAGNOSIS — E785 Hyperlipidemia, unspecified: Secondary | ICD-10-CM | POA: Diagnosis not present

## 2014-08-16 DIAGNOSIS — I4891 Unspecified atrial fibrillation: Secondary | ICD-10-CM | POA: Diagnosis not present

## 2014-08-16 DIAGNOSIS — Z5181 Encounter for therapeutic drug level monitoring: Secondary | ICD-10-CM | POA: Diagnosis not present

## 2014-08-16 LAB — PROTIME-INR: INR: 7.9 — AB (ref 0.9–1.1)

## 2014-08-16 NOTE — Telephone Encounter (Signed)
See anticoag note

## 2014-08-16 NOTE — Telephone Encounter (Signed)
Spoke with [facility], pt INR today was 7.9 by the machine. They have drawn blood and have delivered it to the lab for a stat protime. They have told the pt not to take any warfarin until she hears from [place]. They have also dicussed bleeding precautions with the pt. Lab results are to be called and faxed to [place]. Will make the [facility] clinic aware.

## 2014-08-16 NOTE — Telephone Encounter (Signed)
Joann form [facility] is calling with Stat labs - Panic .Marland Kitchen

## 2014-08-16 NOTE — Telephone Encounter (Signed)
PT= 85.2 and INR = 7.9 Routing to pharm md

## 2014-08-17 ENCOUNTER — Telehealth: Payer: Self-pay | Admitting: *Deleted

## 2014-08-17 NOTE — Telephone Encounter (Signed)
Kiki PT [facility] called letting Dr Riley Kill know that PT  eval cannot do at present because pts INR was 7.9 and they cannot do until below 4.  Dr Rennis Golden is managing INR's.  Holding tx until INR in appropriate range.

## 2014-08-18 ENCOUNTER — Telehealth: Payer: Self-pay | Admitting: Physical Medicine & Rehabilitation

## 2014-08-18 ENCOUNTER — Ambulatory Visit (INDEPENDENT_AMBULATORY_CARE_PROVIDER_SITE_OTHER): Payer: Medicare Other | Admitting: Pharmacist Clinician (PhC)/ Clinical Pharmacy Specialist

## 2014-08-18 DIAGNOSIS — Z5181 Encounter for therapeutic drug level monitoring: Secondary | ICD-10-CM | POA: Diagnosis not present

## 2014-08-18 DIAGNOSIS — E785 Hyperlipidemia, unspecified: Secondary | ICD-10-CM | POA: Diagnosis not present

## 2014-08-18 DIAGNOSIS — Z7901 Long term (current) use of anticoagulants: Secondary | ICD-10-CM | POA: Diagnosis not present

## 2014-08-18 DIAGNOSIS — T82867D Thrombosis of cardiac prosthetic devices, implants and grafts, subsequent encounter: Secondary | ICD-10-CM | POA: Diagnosis not present

## 2014-08-18 DIAGNOSIS — I4891 Unspecified atrial fibrillation: Secondary | ICD-10-CM | POA: Diagnosis not present

## 2014-08-18 DIAGNOSIS — I5030 Unspecified diastolic (congestive) heart failure: Secondary | ICD-10-CM | POA: Diagnosis not present

## 2014-08-18 DIAGNOSIS — Z954 Presence of other heart-valve replacement: Secondary | ICD-10-CM

## 2014-08-18 LAB — POCT INR: INR: 3.7

## 2014-08-18 NOTE — Telephone Encounter (Signed)
Atikia with [facility] wanted to let Dr. Riley Kill  Know there is a delay in PT, patient did not want to start this week.  They will start PT next week.  Any questions please call her at [phone]

## 2014-08-22 ENCOUNTER — Telehealth: Payer: Self-pay | Admitting: Pharmacist Clinician (PhC)/ Clinical Pharmacy Specialist

## 2014-08-22 ENCOUNTER — Ambulatory Visit (INDEPENDENT_AMBULATORY_CARE_PROVIDER_SITE_OTHER): Payer: Medicare Other | Admitting: Pharmacist Clinician (PhC)/ Clinical Pharmacy Specialist

## 2014-08-22 ENCOUNTER — Ambulatory Visit: Payer: Self-pay | Admitting: Physician Assistant

## 2014-08-22 DIAGNOSIS — Z7901 Long term (current) use of anticoagulants: Secondary | ICD-10-CM

## 2014-08-22 DIAGNOSIS — I4891 Unspecified atrial fibrillation: Secondary | ICD-10-CM | POA: Diagnosis not present

## 2014-08-22 DIAGNOSIS — Z954 Presence of other heart-valve replacement: Secondary | ICD-10-CM

## 2014-08-22 DIAGNOSIS — Z5181 Encounter for therapeutic drug level monitoring: Secondary | ICD-10-CM

## 2014-08-22 DIAGNOSIS — E785 Hyperlipidemia, unspecified: Secondary | ICD-10-CM | POA: Diagnosis not present

## 2014-08-22 DIAGNOSIS — Z8674 Personal history of sudden cardiac arrest: Secondary | ICD-10-CM

## 2014-08-22 DIAGNOSIS — Z7952 Long term (current) use of systemic steroids: Secondary | ICD-10-CM

## 2014-08-22 DIAGNOSIS — I5030 Unspecified diastolic (congestive) heart failure: Secondary | ICD-10-CM | POA: Diagnosis not present

## 2014-08-22 DIAGNOSIS — T82867D Thrombosis of cardiac prosthetic devices, implants and grafts, subsequent encounter: Secondary | ICD-10-CM | POA: Diagnosis not present

## 2014-08-22 LAB — POCT INR: INR: 2.8

## 2014-08-22 NOTE — Telephone Encounter (Signed)
INR today 2.8---Please advise

## 2014-08-22 NOTE — Telephone Encounter (Signed)
See anticoag note

## 2014-08-23 ENCOUNTER — Telehealth: Payer: Self-pay | Admitting: Physical Medicine & Rehabilitation

## 2014-08-23 DIAGNOSIS — I5030 Unspecified diastolic (congestive) heart failure: Secondary | ICD-10-CM | POA: Diagnosis not present

## 2014-08-23 DIAGNOSIS — I4891 Unspecified atrial fibrillation: Secondary | ICD-10-CM | POA: Diagnosis not present

## 2014-08-23 DIAGNOSIS — E785 Hyperlipidemia, unspecified: Secondary | ICD-10-CM | POA: Diagnosis not present

## 2014-08-23 DIAGNOSIS — Z7901 Long term (current) use of anticoagulants: Secondary | ICD-10-CM | POA: Diagnosis not present

## 2014-08-23 DIAGNOSIS — T82867D Thrombosis of cardiac prosthetic devices, implants and grafts, subsequent encounter: Secondary | ICD-10-CM | POA: Diagnosis not present

## 2014-08-23 DIAGNOSIS — Z5181 Encounter for therapeutic drug level monitoring: Secondary | ICD-10-CM | POA: Diagnosis not present

## 2014-08-23 NOTE — Telephone Encounter (Signed)
Kiki PT with [facility] went out for [facility] and patient had a fall last night.  Patient had no pain or injury, just got dizzy getting up.  Any questions please call her at [phone]

## 2014-08-24 DIAGNOSIS — Z7901 Long term (current) use of anticoagulants: Secondary | ICD-10-CM | POA: Diagnosis not present

## 2014-08-24 DIAGNOSIS — I5030 Unspecified diastolic (congestive) heart failure: Secondary | ICD-10-CM | POA: Diagnosis not present

## 2014-08-24 DIAGNOSIS — Z5181 Encounter for therapeutic drug level monitoring: Secondary | ICD-10-CM | POA: Diagnosis not present

## 2014-08-24 DIAGNOSIS — T82867D Thrombosis of cardiac prosthetic devices, implants and grafts, subsequent encounter: Secondary | ICD-10-CM | POA: Diagnosis not present

## 2014-08-24 DIAGNOSIS — I4891 Unspecified atrial fibrillation: Secondary | ICD-10-CM | POA: Diagnosis not present

## 2014-08-24 DIAGNOSIS — E785 Hyperlipidemia, unspecified: Secondary | ICD-10-CM | POA: Diagnosis not present

## 2014-08-26 ENCOUNTER — Telehealth: Payer: Self-pay | Admitting: Internal Medicine

## 2014-08-26 ENCOUNTER — Other Ambulatory Visit: Payer: Self-pay | Admitting: Thoracic Surgery (Cardiothoracic Vascular Surgery)

## 2014-08-26 ENCOUNTER — Ambulatory Visit (INDEPENDENT_AMBULATORY_CARE_PROVIDER_SITE_OTHER): Payer: Medicare Other | Admitting: Pharmacist Clinician (PhC)/ Clinical Pharmacy Specialist

## 2014-08-26 DIAGNOSIS — Z7901 Long term (current) use of anticoagulants: Secondary | ICD-10-CM | POA: Diagnosis not present

## 2014-08-26 DIAGNOSIS — T82867D Thrombosis of cardiac prosthetic devices, implants and grafts, subsequent encounter: Secondary | ICD-10-CM | POA: Diagnosis not present

## 2014-08-26 DIAGNOSIS — I4891 Unspecified atrial fibrillation: Secondary | ICD-10-CM | POA: Diagnosis not present

## 2014-08-26 DIAGNOSIS — Z5181 Encounter for therapeutic drug level monitoring: Secondary | ICD-10-CM | POA: Diagnosis not present

## 2014-08-26 DIAGNOSIS — I5030 Unspecified diastolic (congestive) heart failure: Secondary | ICD-10-CM | POA: Diagnosis not present

## 2014-08-26 DIAGNOSIS — E785 Hyperlipidemia, unspecified: Secondary | ICD-10-CM | POA: Diagnosis not present

## 2014-08-26 DIAGNOSIS — Z954 Presence of other heart-valve replacement: Secondary | ICD-10-CM

## 2014-08-26 LAB — POCT INR: INR: 4.6

## 2014-08-26 LAB — PROTIME-INR

## 2014-08-26 NOTE — Telephone Encounter (Signed)
See anticoag note

## 2014-08-26 NOTE — Telephone Encounter (Signed)
Received a call from Donita with [facility] calling to report INR 4.6.Stated she would like to notified of instructions.Message sent to coumadin clinic.

## 2014-08-29 ENCOUNTER — Ambulatory Visit (INDEPENDENT_AMBULATORY_CARE_PROVIDER_SITE_OTHER): Payer: Self-pay | Admitting: Thoracic Surgery (Cardiothoracic Vascular Surgery)

## 2014-08-29 ENCOUNTER — Encounter: Payer: Self-pay | Admitting: Thoracic Surgery (Cardiothoracic Vascular Surgery)

## 2014-08-29 ENCOUNTER — Ambulatory Visit
Admission: RE | Admit: 2014-08-29 | Discharge: 2014-08-29 | Disposition: A | Discharge status: Home / Self Care | Payer: Medicare Other | Source: Ambulatory Visit | Attending: Thoracic Surgery (Cardiothoracic Vascular Surgery) | Admitting: Thoracic Surgery (Cardiothoracic Vascular Surgery)

## 2014-08-29 VITALS — BP 124/56 | HR 80 | Resp 20 | Ht 66.0 in | Wt 146.0 lb

## 2014-08-29 DIAGNOSIS — T82867D Thrombosis of cardiac prosthetic devices, implants and grafts, subsequent encounter: Secondary | ICD-10-CM

## 2014-08-29 DIAGNOSIS — R570 Cardiogenic shock: Secondary | ICD-10-CM

## 2014-08-29 DIAGNOSIS — Z954 Presence of other heart-valve replacement: Secondary | ICD-10-CM

## 2014-08-29 DIAGNOSIS — R918 Other nonspecific abnormal finding of lung field: Secondary | ICD-10-CM | POA: Diagnosis not present

## 2014-08-29 DIAGNOSIS — I050 Rheumatic mitral stenosis: Secondary | ICD-10-CM

## 2014-08-29 DIAGNOSIS — Z952 Presence of prosthetic heart valve: Secondary | ICD-10-CM

## 2014-08-29 DIAGNOSIS — T82867A Thrombosis of cardiac prosthetic devices, implants and grafts, initial encounter: Secondary | ICD-10-CM

## 2014-08-29 DIAGNOSIS — Z953 Presence of xenogenic heart valve: Secondary | ICD-10-CM

## 2014-08-29 MED ORDER — FUROSEMIDE 40 MG PO TABS: 40.0000 mg | ORAL_TABLET | Freq: Every day | ORAL | Status: DC

## 2014-08-29 MED ORDER — POTASSIUM CHLORIDE CRYS ER 20 MEQ PO TBCR: 20.0000 meq | EXTENDED_RELEASE_TABLET | Freq: Every day | ORAL | Status: DC

## 2014-08-29 MED ORDER — AMIODARONE HCL 200 MG PO TABS: 200.0000 mg | ORAL_TABLET | Freq: Every day | ORAL | Status: DC

## 2014-08-29 NOTE — Patient Instructions (Addendum)
The patient is to decrease their dose of Amiodarone to 200 mg daily until their current prescription runs out, then stop taking it altogether.  The patient is to decrease their dose of lasix (furosemide) to one 40 mg tablet daily in the mornings  The patient is to decrease their dose of potassium to on 20 mEq tablet daily in the mornings  The patient should continue to avoid any heavy lifting or strenuous use of arms or shoulders for at least a total of three months from the time of surgery.  The patient is encouraged to enroll and participate in the outpatient cardiac rehab program beginning as soon as practical once home health physical therapy has been discontinued  Record your weight on a daily basis

## 2014-08-29 NOTE — Progress Notes (Signed)
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  Despite her complicated acute presentation prior to surgery, the patient's postoperative recovery has been uncomplicated.  She did develop postoperative atrial fibrillation for which she was treated with amiodarone, but she was maintaining sinus rhythm at the time of hospital discharge. She was discharged to the inpatient rehabilitation service on the ninth postoperative day. She stayed there about a week and was subsequently discharged home with home health physical therapy. The patient returns to our office today for routine follow-up and is accompanied by her daughter. She has been doing very well. She is going to church every day and her activity continues to gradually improve. She denies any significant soreness in her chest. She has not been experiencing any shortness of breath. Her appetite has been slowly improving. Overall she has no particular complaints. Her prothrombin time has been checked on several occasions and her Coumadin dose adjusted to the Coumadin clinic. She has not yet been seen in follow-up by Dr. Rennis Golden.   Current Outpatient Prescriptions  Medication Sig Dispense Refill  . amiodarone (PACERONE) 200 MG tablet Take 1 tablet (200 mg total) by mouth 2 (two) times daily after a meal. 60 tablet 0  . aspirin EC 81 MG EC tablet Take 1 tablet (81 mg total) by mouth daily.    . carvedilol (COREG)  6.25 MG tablet Take 1 tablet (6.25 mg total) by mouth 2 (two) times daily with a meal. 60 tablet 11  . furosemide (LASIX) 40 MG tablet Take 1 tablet (40 mg total) by mouth 2 (two) times daily. 60 tablet 0  . pantoprazole (PROTONIX) 40 MG tablet Take 1 tablet (40 mg total) by mouth daily. 30 tablet 1  . potassium chloride SA (K-DUR,KLOR-CON) 20 MEQ tablet Take 1 tablet (20 mEq total) by mouth 2 (two) times daily. 60 tablet 0  . saccharomyces boulardii (FLORASTOR) 250 MG capsule Take 1 capsule (250 mg total) by mouth 2 (two) times daily. 60 capsule 0  . simvastatin (ZOCOR) 40 MG tablet Take 1 tablet (40 mg total) by mouth daily. 30 tablet 11  . warfarin (COUMADIN) 2 MG tablet Take 1 tablet (2 mg total) by mouth daily at 6 PM. 30 tablet 1   No current facility-administered medications for this visit.      Physical Exam:   BP 124/56 mmHg  Pulse 80  Resp 20  Ht 5\' 6"  (1.676 m)  Wt 146 lb (66.225 kg)  BMI 23.58 kg/m2  SpO2 96%  General:  Well-appearing  Chest:   clear  CV:   Regular rate and rhythm without murmur  Incisions:  Healing nicely, sternum is stable  Abdomen:  Soft and nontender  Extremities:  Warm and well-perfused  Diagnostic Tests:  CHEST 2 VIEW  COMPARISON: 08/02/2014  FINDINGS: The cardiac silhouette is mildly enlarged. Changes from mitral valve surgery are stable. No mediastinal or hilar  masses or evidence of adenopathy.  There is rotated type opacity in both lung bases most consistent with atelectasis, improved from the prior study. Pleural effusions have resolved. There are no areas of lung consolidation. No pulmonary edema. No pneumothorax.  Bony thorax is demineralized but intact.  IMPRESSION: 1. No acute findings. 2. Lung base opacity has improved since prior study with resolution of pleural effusions and improved basilar atelectasis. 3. No other change.   Electronically Signed  By: Amie Portland M.D.  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    Plan:  I have instructed the patient to decrease her dose of amiodarone to 200 mg daily. She will continue amiodarone 200 mg daily until her current prescription runs out and then stop taking amiodarone altogether. I have also instructed the patient to decrease her dose of Lasix to 40 mg daily and correspondingly to cut her dose of potassium to 20 mEq daily. The patient has been instructed to record her weight on a daily basis. It is possible that she may not need any long-term diuretic therapy.  The patient has been encouraged to continue to increase her physical activity as tolerated with her primary limitation remaining that she refrain from heavy lifting or strenuous use of her arms or shoulders for at least another 2 months. When she completes her course of home health physical therapy I have encouraged the patient to enroll in outpatient cardiac rehabilitation program. Patient has been reminded to schedule a routine follow-up appointment with Dr. Rennis Golden in the near future. The patient will return in 2 months at which time we will discuss the possibility of stopping Coumadin.   Salvatore Decent. Cornelius Moras, MD 08/29/2014 11:46 AM

## 2014-08-30 ENCOUNTER — Telehealth: Payer: Self-pay

## 2014-08-30 DIAGNOSIS — Z7901 Long term (current) use of anticoagulants: Secondary | ICD-10-CM | POA: Diagnosis not present

## 2014-08-30 DIAGNOSIS — I5030 Unspecified diastolic (congestive) heart failure: Secondary | ICD-10-CM | POA: Diagnosis not present

## 2014-08-30 DIAGNOSIS — E785 Hyperlipidemia, unspecified: Secondary | ICD-10-CM | POA: Diagnosis not present

## 2014-08-30 DIAGNOSIS — T82867D Thrombosis of cardiac prosthetic devices, implants and grafts, subsequent encounter: Secondary | ICD-10-CM | POA: Diagnosis not present

## 2014-08-30 DIAGNOSIS — I4891 Unspecified atrial fibrillation: Secondary | ICD-10-CM | POA: Diagnosis not present

## 2014-08-30 DIAGNOSIS — Z5181 Encounter for therapeutic drug level monitoring: Secondary | ICD-10-CM | POA: Diagnosis not present

## 2014-08-30 NOTE — Telephone Encounter (Signed)
Orders for INR faxed to [facility].

## 2014-08-31 ENCOUNTER — Telehealth: Payer: Self-pay | Admitting: *Deleted

## 2014-08-31 NOTE — Telephone Encounter (Signed)
Physical Therapist called for verbal orders to extend PT home visits for 4 more weeks 2X a week to progress with mobility.  Verbal orders given per office protocol

## 2014-09-02 ENCOUNTER — Ambulatory Visit (INDEPENDENT_AMBULATORY_CARE_PROVIDER_SITE_OTHER): Payer: Medicare Other | Admitting: Pharmacist Clinician (PhC)/ Clinical Pharmacy Specialist

## 2014-09-02 ENCOUNTER — Telehealth: Payer: Self-pay | Admitting: Internal Medicine

## 2014-09-02 DIAGNOSIS — Z7901 Long term (current) use of anticoagulants: Secondary | ICD-10-CM

## 2014-09-02 DIAGNOSIS — Z953 Presence of xenogenic heart valve: Secondary | ICD-10-CM | POA: Diagnosis not present

## 2014-09-02 DIAGNOSIS — I4891 Unspecified atrial fibrillation: Secondary | ICD-10-CM | POA: Diagnosis not present

## 2014-09-02 DIAGNOSIS — Z5181 Encounter for therapeutic drug level monitoring: Secondary | ICD-10-CM | POA: Diagnosis not present

## 2014-09-02 DIAGNOSIS — I5030 Unspecified diastolic (congestive) heart failure: Secondary | ICD-10-CM | POA: Diagnosis not present

## 2014-09-02 DIAGNOSIS — T82867D Thrombosis of cardiac prosthetic devices, implants and grafts, subsequent encounter: Secondary | ICD-10-CM | POA: Diagnosis not present

## 2014-09-02 DIAGNOSIS — E785 Hyperlipidemia, unspecified: Secondary | ICD-10-CM | POA: Diagnosis not present

## 2014-09-02 LAB — POCT INR: INR: 3.2

## 2014-09-02 NOTE — Telephone Encounter (Signed)
Pt had open heart surgery last month. She wants to know if she can take the cough medicine, Delsym?

## 2014-09-02 NOTE — Telephone Encounter (Signed)
Spoke with patient after confirming with MD that it is OK to take delsym. Patient voiced understanding.

## 2014-09-07 ENCOUNTER — Telehealth: Payer: Self-pay | Admitting: *Deleted

## 2014-09-07 NOTE — Telephone Encounter (Signed)
PT called asking for verbal orders to scale back PT visits to once a week.... Verbal orders given

## 2014-09-08 ENCOUNTER — Ambulatory Visit (INDEPENDENT_AMBULATORY_CARE_PROVIDER_SITE_OTHER): Payer: Medicare Other | Admitting: Pharmacist Clinician (PhC)/ Clinical Pharmacy Specialist

## 2014-09-08 ENCOUNTER — Telehealth: Payer: Self-pay | Admitting: Pharmacist Clinician (PhC)/ Clinical Pharmacy Specialist

## 2014-09-08 DIAGNOSIS — Z953 Presence of xenogenic heart valve: Secondary | ICD-10-CM

## 2014-09-08 DIAGNOSIS — Z7901 Long term (current) use of anticoagulants: Secondary | ICD-10-CM | POA: Diagnosis not present

## 2014-09-08 DIAGNOSIS — I4891 Unspecified atrial fibrillation: Secondary | ICD-10-CM | POA: Diagnosis not present

## 2014-09-08 DIAGNOSIS — T82867D Thrombosis of cardiac prosthetic devices, implants and grafts, subsequent encounter: Secondary | ICD-10-CM | POA: Diagnosis not present

## 2014-09-08 DIAGNOSIS — E785 Hyperlipidemia, unspecified: Secondary | ICD-10-CM | POA: Diagnosis not present

## 2014-09-08 DIAGNOSIS — Z5181 Encounter for therapeutic drug level monitoring: Secondary | ICD-10-CM | POA: Diagnosis not present

## 2014-09-08 DIAGNOSIS — I5030 Unspecified diastolic (congestive) heart failure: Secondary | ICD-10-CM | POA: Diagnosis not present

## 2014-09-08 LAB — POCT INR: INR: 2.3

## 2014-09-08 NOTE — Telephone Encounter (Signed)
Orders clarified, INR drawn today, see anticoag note

## 2014-09-08 NOTE — Telephone Encounter (Signed)
She needs order for INR for today please.

## 2014-09-09 ENCOUNTER — Ambulatory Visit: Payer: Self-pay | Admitting: Physician Assistant

## 2014-09-14 ENCOUNTER — Telehealth: Payer: Self-pay | Admitting: Internal Medicine

## 2014-09-14 MED ORDER — FUROSEMIDE 40 MG PO TABS: 40.0000 mg | ORAL_TABLET | Freq: Every day | ORAL | Status: DC

## 2014-09-14 NOTE — Telephone Encounter (Signed)
Patient would like to know if Dr. Rennis Golden will refill her generic Lasix 40 mg ---if so , please call the [facility].  Please let her know either way.

## 2014-09-14 NOTE — Telephone Encounter (Signed)
Lasix refilled.   Encounter routed to to [facility] for warfarin refill.

## 2014-09-14 NOTE — Telephone Encounter (Signed)
°  1. Which medications need to be refilled? Coumadin 5mg   2. Which pharmacy is medication to be sent to?[facility]   3. Do they need a 30 day or 90 day supply? 30  4. Would they like a call back once the medication has been sent to the pharmacy? Yes

## 2014-09-15 ENCOUNTER — Telehealth: Payer: Self-pay | Admitting: *Deleted

## 2014-09-15 ENCOUNTER — Other Ambulatory Visit: Payer: Self-pay | Admitting: *Deleted

## 2014-09-15 MED ORDER — POTASSIUM CHLORIDE CRYS ER 20 MEQ PO TBCR: 20.0000 meq | EXTENDED_RELEASE_TABLET | Freq: Every day | ORAL | Status: DC

## 2014-09-15 NOTE — Telephone Encounter (Signed)
Faxed signed orders r/t PT/INR to [facility] 

## 2014-09-16 ENCOUNTER — Ambulatory Visit (INDEPENDENT_AMBULATORY_CARE_PROVIDER_SITE_OTHER): Payer: Medicare Other | Admitting: Pharmacist Clinician (PhC)/ Clinical Pharmacy Specialist

## 2014-09-16 DIAGNOSIS — I5030 Unspecified diastolic (congestive) heart failure: Secondary | ICD-10-CM | POA: Diagnosis not present

## 2014-09-16 DIAGNOSIS — E785 Hyperlipidemia, unspecified: Secondary | ICD-10-CM | POA: Diagnosis not present

## 2014-09-16 DIAGNOSIS — Z7901 Long term (current) use of anticoagulants: Secondary | ICD-10-CM | POA: Diagnosis not present

## 2014-09-16 DIAGNOSIS — T82867D Thrombosis of cardiac prosthetic devices, implants and grafts, subsequent encounter: Secondary | ICD-10-CM | POA: Diagnosis not present

## 2014-09-16 DIAGNOSIS — Z953 Presence of xenogenic heart valve: Secondary | ICD-10-CM

## 2014-09-16 DIAGNOSIS — Z5181 Encounter for therapeutic drug level monitoring: Secondary | ICD-10-CM | POA: Diagnosis not present

## 2014-09-16 DIAGNOSIS — I4891 Unspecified atrial fibrillation: Secondary | ICD-10-CM | POA: Diagnosis not present

## 2014-09-16 LAB — POCT INR: INR: 1.8

## 2014-09-16 MED ORDER — WARFARIN SODIUM 2 MG PO TABS: ORAL_TABLET | ORAL | Status: DC

## 2014-09-16 NOTE — Telephone Encounter (Signed)
Has this been taken care of?

## 2014-09-19 ENCOUNTER — Other Ambulatory Visit: Payer: Self-pay | Admitting: Pharmacist Clinician (PhC)/ Clinical Pharmacy Specialist

## 2014-09-19 MED ORDER — WARFARIN SODIUM 2 MG PO TABS: ORAL_TABLET | ORAL | Status: DC

## 2014-09-22 ENCOUNTER — Encounter: Payer: Self-pay | Admitting: Physician Assistant

## 2014-09-22 ENCOUNTER — Ambulatory Visit (INDEPENDENT_AMBULATORY_CARE_PROVIDER_SITE_OTHER): Payer: Medicare Other | Admitting: Physician Assistant

## 2014-09-22 VITALS — BP 100/70 | HR 60 | Ht 63.0 in | Wt 145.3 lb

## 2014-09-22 DIAGNOSIS — I4819 Other persistent atrial fibrillation: Secondary | ICD-10-CM

## 2014-09-22 DIAGNOSIS — I5032 Chronic diastolic (congestive) heart failure: Secondary | ICD-10-CM | POA: Diagnosis not present

## 2014-09-22 DIAGNOSIS — E785 Hyperlipidemia, unspecified: Secondary | ICD-10-CM

## 2014-09-22 DIAGNOSIS — Z953 Presence of xenogenic heart valve: Secondary | ICD-10-CM

## 2014-09-22 DIAGNOSIS — T82867D Thrombosis of cardiac prosthetic devices, implants and grafts, subsequent encounter: Secondary | ICD-10-CM

## 2014-09-22 DIAGNOSIS — I481 Persistent atrial fibrillation: Secondary | ICD-10-CM

## 2014-09-22 DIAGNOSIS — I4891 Unspecified atrial fibrillation: Secondary | ICD-10-CM | POA: Insufficient documentation

## 2014-09-22 DIAGNOSIS — I10 Essential (primary) hypertension: Secondary | ICD-10-CM

## 2014-09-22 MED ORDER — CARVEDILOL 6.25 MG PO TABS: 3.1250 mg | ORAL_TABLET | Freq: Two times a day (BID) | ORAL | Status: DC

## 2014-09-22 NOTE — Patient Instructions (Addendum)
Your physician recommends that you schedule a follow-up appointment in: 3 MONTHS WITH DR HILTY   DECREASE CARVEDILOL TO 3.125 MG TWICE DAILY= 1/2 OF THE 6.25 MG TABLETS TWICE DAILY

## 2014-09-22 NOTE — Progress Notes (Signed)
Patient ID: [name], female   DOB: 1945/03/10, 69 y.o.   MRN: [id number]    Date:  09/22/2014   ID:  [name], DOB [birth year], MRN [id number]  PCP:  No PCP Per Patient  Primary Cardiologist:  Hilty  Chief Complaint  Patient presents with  . Follow-up    Post hospital:  No complaints of chest pain, SOB, edema or dizziness     History of Present Illness: [name] is a 69 y.o. female with a history of hypertension, hyperlipidemia mitral valve area in April 2000 with a St. Jude bileaflet mechanical valve secondary to endocarditis. She is status post redo mitral valve replacement with a bioprosthetic valve which is at 27 mm Edwards magna completed 07/25/2014. She also has a history of atrial fibrillation and is on amiodarone, chronic renal insufficiency, hyperlipidemia.  She is on Coumadin.  She had a cardiac catheterization prior to valve replacement which revealed no evidence of obstructive CAD. There was severe mitral stenosis and pulmonary hypertension.  The TEE did not done 07/22/2014 showed an ejection fraction of 55-60%. During the study the mitral valve mechanical prosthesis posterior leaflet was fixed in the closed position.  Patient presents for posthospital follow-up.  She says she occasionally gets little dizzy when she is in the bright sun. But otherwise she denies nausea, vomiting, fever, chest pain, shortness of breath, orthopnea, PND, cough, congestion, abdominal pain, hematochezia, melena, lower extremity edema, claudication.  Wt Readings from Last 3 Encounters:  09/22/14 145 lb 4.8 oz (65.908 kg)  08/29/14 146 lb (66.225 kg)  08/10/14 146 lb 13.2 oz (66.6 kg)     Past Medical History  Diagnosis Date  . Hypertension   . Hyperlipidemia   . S/P MVR (mitral valve replacement) 04/11/1998    #29 St. Jude bileaflet mechanical valve for bacterial endocarditis  . 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 Prosthetic valve dysfunction  07/22/2014    Acute mitral stenosis due to restricted leaflet mobility of bileaflet mechanical mitral valve prosthesis  . 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 S/P redo mitral valve replacement with bioprosthetic valve 07/25/2014    27 mm [facility] Mitral bovine bioprosthetic tissue valve    Current Outpatient Prescriptions  Medication Sig Dispense Refill  . amiodarone (PACERONE) 200 MG tablet Take 1 tablet (200 mg total) by mouth daily. 60 tablet 0  . aspirin EC 81 MG EC tablet Take 1 tablet (81 mg total) by mouth daily.    . carvedilol (COREG) 6.25 MG tablet Take 0.5 tablets (3.125 mg total) by mouth 2 (two) times daily with a meal. 180 tablet 3  . furosemide (LASIX) 40 MG tablet Take 1 tablet (40 mg total) by mouth daily. 30 tablet 5  . pantoprazole (PROTONIX) 40 MG tablet Take 1 tablet (40 mg total) by mouth daily. 30 tablet 1  . potassium chloride SA (K-DUR,KLOR-CON) 20 MEQ tablet Take 1 tablet (20 mEq total) by mouth daily. 60 tablet 0  . saccharomyces boulardii (FLORASTOR) 250 MG capsule Take 1 capsule (250 mg total) by mouth 2 (two) times daily. 60 capsule 0  . simvastatin (ZOCOR) 40 MG tablet Take 1 tablet (40 mg total) by mouth daily. 30 tablet 11  . warfarin (COUMADIN) 2 MG tablet Take 1 tablet by mouth daily or as directed by coumadin clinic 30 tablet 1   No current facility-administered medications for this visit.    Allergies:    Allergies  Allergen Reactions  .  Augmentin [Amoxicillin-Pot Clavulanate]     Social History:  The patient  reports that she quit smoking about 41 years ago. She has never used smokeless tobacco. She reports that she does not drink alcohol or use illicit drugs.   Family history:   Family History  Problem Relation Age of Onset  . Lung cancer Mother   . Heart failure Maternal Grandmother   . Heart failure Maternal Grandfather     ROS:  Please see the history of present illness.  All other systems reviewed and negative.    PHYSICAL EXAM: VS:  BP 100/70 mmHg  Pulse 60  Ht 5\' 3"  (1.6 m)  Wt 145 lb 4.8 oz (65.908 kg)  BMI 25.75 kg/m2 Well nourished, well developed, in no acute distress HEENT: Pupils are equal round react to light accommodation extraocular movements are intact.  Neck: no JVDNo cervical lymphadenopathy. Cardiac: Irregular rate and rhythm without murmurs rubs or gallops.  Rate slow  Lungs:  clear to auscultation bilaterally, no wheezing, rhonchi or rales Abd: soft, nontender, positive bowel sounds all quadrants, no hepatosplenomegaly.  Ext: no lower extremity edema.  2+ radial and dorsalis pedis pulses. Skin: warm and dry Neuro:  Grossly normal  EKG:   Atrial fibrillation rate 49 bpm right bundle branch block occasional P VC  ASSESSMENT AND PLAN:  Problem List Items Addressed This Visit    S/P redo mitral valve replacement with bioprosthetic valve - Primary   Relevant Orders   EKG 12-Lead   HTN (hypertension)   Relevant Medications   carvedilol (COREG) 6.25 MG tablet   Dyslipidemia   Diastolic CHF   Relevant Medications   carvedilol (COREG) 6.25 MG tablet   Atrial fibrillation   Relevant Medications   carvedilol (COREG) 6.25 MG tablet      Status post redo mitral valve replacement with a bioprosthetic valve: Patient appears to be doing quite well.  Essential hypertension Blood pressure little on the low side. When a decrease Coreg to 3.125  Dyslipidemia Continue statin  Atrial fibrillation with slow ventricular response Does not appear to be symptomatic. Decrease Coreg 3.125 twice daily.  Continue amiodarone and Coumadin. Diastolic heart failure, chronic Appears euvolemic. Currently taking Lasix 40 mg daily and potassium. We discussed daily weight monitoring and when to call the office either with a decreased weight or increased weight.

## 2014-09-23 ENCOUNTER — Ambulatory Visit (INDEPENDENT_AMBULATORY_CARE_PROVIDER_SITE_OTHER): Payer: Medicare Other | Admitting: Pharmacist Clinician (PhC)/ Clinical Pharmacy Specialist

## 2014-09-23 DIAGNOSIS — Z953 Presence of xenogenic heart valve: Secondary | ICD-10-CM

## 2014-09-23 DIAGNOSIS — I5030 Unspecified diastolic (congestive) heart failure: Secondary | ICD-10-CM | POA: Diagnosis not present

## 2014-09-23 DIAGNOSIS — Z5181 Encounter for therapeutic drug level monitoring: Secondary | ICD-10-CM | POA: Diagnosis not present

## 2014-09-23 DIAGNOSIS — I4891 Unspecified atrial fibrillation: Secondary | ICD-10-CM | POA: Diagnosis not present

## 2014-09-23 DIAGNOSIS — Z7901 Long term (current) use of anticoagulants: Secondary | ICD-10-CM | POA: Diagnosis not present

## 2014-09-23 DIAGNOSIS — E785 Hyperlipidemia, unspecified: Secondary | ICD-10-CM | POA: Diagnosis not present

## 2014-09-23 DIAGNOSIS — T82867D Thrombosis of cardiac prosthetic devices, implants and grafts, subsequent encounter: Secondary | ICD-10-CM | POA: Diagnosis not present

## 2014-09-23 LAB — POCT INR: INR: 2

## 2014-09-26 DIAGNOSIS — Z5181 Encounter for therapeutic drug level monitoring: Secondary | ICD-10-CM | POA: Diagnosis not present

## 2014-09-26 DIAGNOSIS — I4891 Unspecified atrial fibrillation: Secondary | ICD-10-CM | POA: Diagnosis not present

## 2014-09-26 DIAGNOSIS — I5030 Unspecified diastolic (congestive) heart failure: Secondary | ICD-10-CM | POA: Diagnosis not present

## 2014-09-26 DIAGNOSIS — Z7901 Long term (current) use of anticoagulants: Secondary | ICD-10-CM | POA: Diagnosis not present

## 2014-09-26 DIAGNOSIS — E785 Hyperlipidemia, unspecified: Secondary | ICD-10-CM | POA: Diagnosis not present

## 2014-09-26 DIAGNOSIS — T82867D Thrombosis of cardiac prosthetic devices, implants and grafts, subsequent encounter: Secondary | ICD-10-CM | POA: Diagnosis not present

## 2014-09-27 ENCOUNTER — Telehealth: Payer: Self-pay | Admitting: *Deleted

## 2014-09-27 NOTE — Telephone Encounter (Signed)
Faxed orders r/t PT/INR to [facility] 

## 2014-10-07 ENCOUNTER — Ambulatory Visit (INDEPENDENT_AMBULATORY_CARE_PROVIDER_SITE_OTHER): Payer: Medicare Other | Admitting: Pharmacist Clinician (PhC)/ Clinical Pharmacy Specialist

## 2014-10-07 DIAGNOSIS — Z953 Presence of xenogenic heart valve: Secondary | ICD-10-CM

## 2014-10-07 DIAGNOSIS — Z952 Presence of prosthetic heart valve: Secondary | ICD-10-CM

## 2014-10-07 DIAGNOSIS — Z954 Presence of other heart-valve replacement: Secondary | ICD-10-CM | POA: Diagnosis not present

## 2014-10-07 DIAGNOSIS — Z7901 Long term (current) use of anticoagulants: Secondary | ICD-10-CM

## 2014-10-07 LAB — POCT INR: INR: 2.4

## 2014-10-18 ENCOUNTER — Other Ambulatory Visit: Payer: Self-pay | Admitting: *Deleted

## 2014-10-18 MED ORDER — PANTOPRAZOLE SODIUM 40 MG PO TBEC: 40.0000 mg | DELAYED_RELEASE_TABLET | Freq: Every day | ORAL | Status: DC

## 2014-10-28 ENCOUNTER — Ambulatory Visit (INDEPENDENT_AMBULATORY_CARE_PROVIDER_SITE_OTHER): Payer: Medicare Other | Admitting: Pharmacist Clinician (PhC)/ Clinical Pharmacy Specialist

## 2014-10-28 DIAGNOSIS — Z953 Presence of xenogenic heart valve: Secondary | ICD-10-CM

## 2014-10-28 DIAGNOSIS — Z7901 Long term (current) use of anticoagulants: Secondary | ICD-10-CM

## 2014-10-28 LAB — POCT INR: INR: 2.4

## 2014-10-31 ENCOUNTER — Encounter: Payer: Medicare Other | Admitting: Thoracic Surgery (Cardiothoracic Vascular Surgery)

## 2014-10-31 ENCOUNTER — Encounter: Payer: Self-pay | Admitting: Thoracic Surgery (Cardiothoracic Vascular Surgery)

## 2014-10-31 NOTE — Progress Notes (Signed)
This encounter was created in error - please disregard.  This encounter was created in error - please disregard.

## 2014-11-02 ENCOUNTER — Telehealth: Payer: Self-pay | Admitting: *Deleted

## 2014-11-03 ENCOUNTER — Other Ambulatory Visit: Payer: Self-pay | Admitting: Internal Medicine

## 2014-11-07 ENCOUNTER — Ambulatory Visit (INDEPENDENT_AMBULATORY_CARE_PROVIDER_SITE_OTHER): Payer: Medicare Other | Admitting: Thoracic Surgery (Cardiothoracic Vascular Surgery)

## 2014-11-07 ENCOUNTER — Telehealth: Payer: Self-pay | Admitting: *Deleted

## 2014-11-07 ENCOUNTER — Encounter: Payer: Self-pay | Admitting: Thoracic Surgery (Cardiothoracic Vascular Surgery)

## 2014-11-07 VITALS — BP 140/103 | HR 96 | Resp 16 | Ht 63.0 in | Wt 148.0 lb

## 2014-11-07 DIAGNOSIS — I050 Rheumatic mitral stenosis: Secondary | ICD-10-CM

## 2014-11-07 DIAGNOSIS — T82867D Thrombosis of cardiac prosthetic devices, implants and grafts, subsequent encounter: Secondary | ICD-10-CM

## 2014-11-07 DIAGNOSIS — Z953 Presence of xenogenic heart valve: Secondary | ICD-10-CM

## 2014-11-07 NOTE — Telephone Encounter (Signed)
Faxed order for phase 2 cardiac rehab  

## 2014-11-07 NOTE — Progress Notes (Signed)
[address]       [place] [address]             [phone]     CARDIOTHORACIC SURGERY OFFICE NOTE  Referring Provider is Jake Bathe, MD Primary Cardiologist is Chrystie Nose, MD Primary Care Physician is Raeford Razor, MD    HPI:  Patient returns to the office today for routine follow-up status post emergency redo aortic valve replacement using a bioprosthetic tissue valve on 07/25/2014 for prosthetic valve dysfunction due to valve thrombosis with history of mechanical mitral valve replacement in the remote past. Her presentation at the time was complicated by cardiogenic shock and cardiac arrest.  Despite her complicated acute presentation prior to surgery the patient recovered remarkably well. She developed postoperative atrial fibrillation for which she was treated with amiodarone. She was noted to have some degree of cognitive dysfunction particularly with notable short-term memory disturbance that was felt likely to be related to the patient's acute recent [place] and preoperative cardiac arrest.  She was initially discharged to the inpatient rehabilitation service but only stayed there for approximately one week. She was discharged home in stayed with her daughter for a period of time.  She was last seen here in our office on 08/29/2014 at which time she was doing fairly well.  Since then she was seen in follow-up by Wilburt Finlay at [facility] on 09/22/2014. She was felt to be in atrial fibrillation at that time. She returns to our office for follow-up today. She comes in alone and states that she is no longer staying with her daughter. She is living independently and drove herself to our office today. She did not bring her list of current medications with her and she does not know what medications she is taking. She does state that she remains on Coumadin and she has had regular follow-up in the Coumadin clinic. She states that she feels remarkably well.  She denies any symptoms of exertional shortness of breath or chest discomfort. She has not had any palpitations or dizzy spells. She states that her only complaint is that her family members think that she still has some problems with memory. She disputes this.   Current Outpatient Prescriptions  Medication Sig Dispense Refill  . amiodarone (PACERONE) 200 MG tablet Take 1 tablet (200 mg total) by mouth daily. 60 tablet 0  . carvedilol (COREG) 6.25 MG tablet Take 0.5 tablets (3.125 mg total) by mouth 2 (two) times daily with a meal. 180 tablet 3  . furosemide (LASIX) 40 MG tablet Take 1 tablet (40 mg total) by mouth daily. 30 tablet 5  . pantoprazole (PROTONIX) 40 MG tablet Take 1 tablet (40 mg total) by mouth daily. 30 tablet 0  . potassium chloride SA (K-DUR,KLOR-CON) 20 MEQ tablet TAKE 1 TABLET BY MOUTH ONCE DAILY 60 tablet 3  . saccharomyces boulardii (FLORASTOR) 250 MG capsule Take 1 capsule (250 mg total) by mouth 2 (two) times daily. 60 capsule 0  . simvastatin (ZOCOR) 40 MG tablet Take 1 tablet (40 mg total) by mouth daily. 30 tablet 11  . warfarin (COUMADIN) 2 MG tablet Take 1 tablet by mouth daily or as directed by coumadin clinic 30 tablet 1  . aspirin EC 81 MG EC tablet Take 1 tablet (81 mg total) by mouth daily.     No current facility-administered medications for this visit.      Physical Exam:   BP 140/103 mmHg  Pulse 96  Resp 16  Ht 5\' 3"  (1.6 m)  Wt 148 lb (67.132 kg)  BMI 26.22 kg/m2  SpO2 98%  General:  Well-appearing  Chest:   Clear to auscultation  CV:   Regular rate and rhythm without murmur  Incisions:  Completely healed, sternum is stable  Abdomen:  Soft and nontender  Extremities:  Warm and well-perfused  Diagnostic Tests:  n/a   Impression:  Patient is doing very well more than 3 months status post emergency redo mitral valve replacement using a bioprosthetic tissue valve. Prior to surgery she presented with severe prosthetic valve dysfunction of her  previous mechanical mitral valve secondary to both acute and subacute valve thrombosis.  She initially presented in cardiogenic shock and suffered a cardiac arrest. She has had some cognitive dysfunction ever since, although she clearly has improved significantly.  In the past the patient's family has suggested that she may have had some mild problems with short term memory even prior to her acute presentation and surgery. Despite this the patient currently reports to be living alone and functionally independent. She appears to be doing very well although she does not know what medications she is taking at present. She does remain on Coumadin.  Plan:  We have not recommended that the patient change any of her medications at this time. I suggested that she should keep a list in her pocketbook with her. I've also encouraged her to make certain she has been scheduled for a follow-up appointment with Dr. Rennis Golden within the next 2-3 months. It would be reasonable to consider stopping Coumadin if she is maintaining sinus rhythm, although she was reportedly felt to be an atrophic fibrillation at the time of her most recent office visit at [facility].  The patient has been reminded regarding the importance of dental hygiene and the lifelong need for antibiotic prophylaxis for all dental cleanings and other related invasive procedures. We will plan to see her back for routine follow-up next July, approximately 1 year following her surgery. During the interim period of time she will call and return to see [place] only as needed.  I spent in excess of 15 minutes during the conduct of this office consultation and >50% of this time involved direct face-to-face encounter with the patient for counseling and/or coordination of their care.   Salvatore Decent. Cornelius Moras, MD 11/07/2014 3:40 PM

## 2014-11-07 NOTE — Patient Instructions (Signed)
Continue all previous medications without any changes at this time  Make certain that you are scheduled for follow up appointment with Dr. Rennis Golden within the next 2-3 months.  Endocarditis is a potentially serious infection of heart valves or inside lining of the heart.  It occurs more commonly in patients with diseased heart valves (such as patient's with aortic or mitral valve disease) and in patients who have undergone heart valve repair or replacement.  Certain surgical and dental procedures may put you at risk, such as dental cleaning, other dental procedures, or any surgery involving the respiratory, urinary, gastrointestinal tract, gallbladder or prostate gland.   To minimize your chances for develooping endocarditis, maintain good oral health and seek prompt medical attention for any infections involving the mouth, teeth, gums, skin or urinary tract.    Always notify your doctor or dentist about your underlying heart valve condition before having any invasive procedures. You will need to take antibiotics before certain procedures, including all routine dental cleanings or other dental procedures.  Your cardiologist or dentist should prescribe these antibiotics for you to be taken ahead of time.

## 2014-11-16 ENCOUNTER — Encounter: Payer: Self-pay | Admitting: Internal Medicine

## 2014-11-17 ENCOUNTER — Telehealth: Payer: Self-pay | Admitting: Internal Medicine

## 2014-11-17 ENCOUNTER — Telehealth (HOSPITAL_COMMUNITY): Payer: Self-pay

## 2014-11-17 NOTE — Telephone Encounter (Signed)
Pt has appointment for INR on 11/11, will discuss importance of med compliance

## 2014-11-17 NOTE — Telephone Encounter (Signed)
Pt refused CRPII at this time. Informed pt that if anything changes or decides to come to rehab please contact us at [phone]

## 2014-11-17 NOTE — Telephone Encounter (Signed)
Pharmacy called to notify on amiodarone - wanted to make sure we knew pt last had filled in august - concern for compliance, she's not sure pt remembers to take every day. I do note she had recently been instructed on taking this daily instead of twice a day. Pharmacist concerned for possible alteration in coumadin levels if restarting amiodarone.  I reviewed, pt has visit w/ [place] tomorrow to see Dr. Rennis Golden and also to have PT INR done. Informed pharmacist I will route to [facility] to review.

## 2014-11-18 ENCOUNTER — Ambulatory Visit (INDEPENDENT_AMBULATORY_CARE_PROVIDER_SITE_OTHER): Payer: Medicare Other | Admitting: Pharmacist Clinician (PhC)/ Clinical Pharmacy Specialist

## 2014-11-18 DIAGNOSIS — Z953 Presence of xenogenic heart valve: Secondary | ICD-10-CM

## 2014-11-18 DIAGNOSIS — Z7901 Long term (current) use of anticoagulants: Secondary | ICD-10-CM | POA: Diagnosis not present

## 2014-11-18 LAB — POCT INR: INR: 1.2

## 2014-11-28 ENCOUNTER — Ambulatory Visit (INDEPENDENT_AMBULATORY_CARE_PROVIDER_SITE_OTHER): Payer: Medicare Other | Admitting: Pharmacist Clinician (PhC)/ Clinical Pharmacy Specialist

## 2014-11-28 DIAGNOSIS — Z7901 Long term (current) use of anticoagulants: Secondary | ICD-10-CM

## 2014-11-28 DIAGNOSIS — Z953 Presence of xenogenic heart valve: Secondary | ICD-10-CM | POA: Diagnosis not present

## 2014-11-28 LAB — POCT INR: INR: 2.2

## 2014-11-29 ENCOUNTER — Ambulatory Visit (HOSPITAL_COMMUNITY): Payer: Self-pay

## 2014-12-13 ENCOUNTER — Telehealth: Payer: Self-pay | Admitting: Internal Medicine

## 2014-12-13 ENCOUNTER — Ambulatory Visit (INDEPENDENT_AMBULATORY_CARE_PROVIDER_SITE_OTHER): Payer: Medicare Other | Admitting: Family Medicine

## 2014-12-13 ENCOUNTER — Ambulatory Visit (INDEPENDENT_AMBULATORY_CARE_PROVIDER_SITE_OTHER): Payer: Medicare Other

## 2014-12-13 ENCOUNTER — Telehealth: Payer: Self-pay | Admitting: Family Medicine

## 2014-12-13 VITALS — BP 126/74 | HR 91 | Temp 97.5°F | Resp 17 | Ht 65.0 in | Wt 151.0 lb

## 2014-12-13 DIAGNOSIS — Z952 Presence of prosthetic heart valve: Secondary | ICD-10-CM

## 2014-12-13 DIAGNOSIS — Z954 Presence of other heart-valve replacement: Secondary | ICD-10-CM | POA: Diagnosis not present

## 2014-12-13 DIAGNOSIS — R059 Cough, unspecified: Secondary | ICD-10-CM

## 2014-12-13 DIAGNOSIS — T82867D Thrombosis of cardiac prosthetic devices, implants and grafts, subsequent encounter: Secondary | ICD-10-CM | POA: Diagnosis not present

## 2014-12-13 DIAGNOSIS — R0602 Shortness of breath: Secondary | ICD-10-CM | POA: Diagnosis not present

## 2014-12-13 DIAGNOSIS — J189 Pneumonia, unspecified organism: Secondary | ICD-10-CM

## 2014-12-13 DIAGNOSIS — I5030 Unspecified diastolic (congestive) heart failure: Secondary | ICD-10-CM | POA: Diagnosis not present

## 2014-12-13 DIAGNOSIS — I509 Heart failure, unspecified: Secondary | ICD-10-CM | POA: Diagnosis not present

## 2014-12-13 DIAGNOSIS — R05 Cough: Secondary | ICD-10-CM

## 2014-12-13 LAB — POCT CBC
Granulocyte percent: 55.9 %G (ref 37–80)
HCT, POC: 36.5 % — AB (ref 37.7–47.9)
HEMOGLOBIN: 12.3 g/dL (ref 12.2–16.2)
LYMPH, POC: 1.6 (ref 0.6–3.4)
MCH: 26.2 pg — AB (ref 27–31.2)
MCHC: 33.8 g/dL (ref 31.8–35.4)
MCV: 77.5 fL — AB (ref 80–97)
MID (cbc): 0.4 (ref 0–0.9)
MPV: 7.1 fL (ref 0–99.8)
POC Granulocyte: 2.5 (ref 2–6.9)
POC LYMPH PERCENT: 34.7 %L (ref 10–50)
POC MID %: 9.4 %M (ref 0–12)
Platelet Count, POC: 286 10*3/uL (ref 142–424)
RBC: 4.7 M/uL (ref 4.04–5.48)
RDW, POC: 19.1 %
WBC: 4.5 10*3/uL — AB (ref 4.6–10.2)

## 2014-12-13 LAB — BRAIN NATRIURETIC PEPTIDE: Brain Natriuretic Peptide: 2395.6 pg/mL — ABNORMAL HIGH (ref 0.0–100.0)

## 2014-12-13 MED ORDER — DOXYCYCLINE HYCLATE 100 MG PO TABS: 100.0000 mg | ORAL_TABLET | Freq: Two times a day (BID) | ORAL | Status: DC

## 2014-12-13 NOTE — Telephone Encounter (Signed)
Left message w/ OTC recommendations & number to call if further questions.

## 2014-12-13 NOTE — Telephone Encounter (Signed)
Stat lab result received: Results for orders placed or performed in visit on 12/13/14  Brain natriuretic peptide  Result Value Ref Range   Brain Natriuretic Peptide 2395.6 (H) 0.0 - 100.0 pg/mL  Will call patient in am to likely be evaluated in ER given pleural effusion, and now appears to be CHF>

## 2014-12-13 NOTE — Telephone Encounter (Signed)
Spoke with daughter and she was wanting to know what patient could take for cold Per daughter patient has had a cold for over 2 weeks now and she is feeling "hot and then cold" Daughter states she can her her breathing over the phone and she is just feeling worse Advised daughter to call PCP or take her to Urgent Care as she may need chest Xray  Daughter verbalized understanding

## 2014-12-13 NOTE — Telephone Encounter (Signed)
Pt has a cold,what can she take? If not there,please leave a message.

## 2014-12-13 NOTE — Progress Notes (Addendum)
Subjective:  This chart was scribed for Margaret Staggers, MD by [name], Medical Scribe. This patient was seen in room 9 and the patient's care was started 5:15 PM.   Patient ID: [name], female    DOB: 1945/09/22, 69 y.o.   MRN: [id number] Chief Complaint  Patient presents with  . Shortness of Breath  . Hot Flashes  . Chills  . Abdominal Pain    HPI [name] is a 69 y.o. female Pt is here for multiple concerns today, including shortness of breath, hot flashes, chills and abd pain. She has multiple medical problems including afib, chf, mitral valve replacement, HTN. Phone note today to cardiologist regarding possible worsening breathing with cold symptoms, advised pcp or urgent care evaluation. She denies having a PCP.   Cardiology She presented to hospital July this year with cardiogenic shock and cardiac arrest. Post op afib, treated with amiodarone and coumadin. Some resulting cognitive dysfunction since cardiogenic shock and arrest with possible short term memory issues prior to surgery. She has appointment with her cardiologist, Dr. Rennis Golden, next week. She's been taking her medications.   Illness She has cold symptoms that started 2 weeks ago with congestion, rhinorrhea, and cough. She had some hot & cold spells early on and have some spells today as well. She also noticed shortness of breath during coughs, initially thought to be something she ate. She felt chest congestion, coughing up yellow phlegm, starting a week ago. She states loss of sleep due to the coughs. She also mentions having abd pain when she coughs. She denies taking medication for this. She denies appetite loss, diarrhea, vomiting. She denies copd or any other lung problems.   Personal She is brought in with her daughter today. Her daughter works as a Sales executive. She is living alone.   Patient Active Problem List   Diagnosis Date Noted  . Atrial fibrillation (HCC) 09/22/2014    . Debility 08/04/2014  . Anoxic encephalopathy (HCC) 08/04/2014  . Diastolic CHF (HCC) 08/04/2014  . 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 S/P redo mitral valve replacement with bioprosthetic valve 07/25/2014  . Prosthetic valve dysfunction 07/22/2014  . Shock (HCC) 07/21/2014  . Cardiac arrest (HCC) 07/21/2014  . Acute respiratory failure, unspecified whether with hypoxia or hypercapnia (HCC)   . Metabolic acidosis   . RBBB 03/11/2014  . HTN (hypertension) 01/23/2013  . Dyslipidemia 01/23/2013   Past Medical History  Diagnosis Date  . Hypertension   . Hyperlipidemia   . S/P MVR (mitral valve replacement) 04/11/1998    #29 St. Jude bileaflet mechanical valve for bacterial endocarditis  . 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 S/P redo mitral valve replacement with bioprosthetic valve 07/25/2014    27 mm [facility] Mitral bovine bioprosthetic tissue valve   Past Surgical History  Procedure Laterality Date  . Mitral valve replacement  04/11/1998    St. Jude mechanical MVR (severe MR, episode of streptococcal bacterial endocarditis - Dr. Cornelius Moras  . Tubal ligation    . Transthoracic echocardiogram  11/2009    EF=>55%; bi-leaflet St. Jude mech prosthesis; mild TR, RVSP elevated at 40-51mmHg, mod pulm HTN; trace AV regurg; mild pulm valve regurg  . Total abdominal hysterectomy w/ bilateral salpingoophorectomy  12/14/1998  . Cardiac catheterization  N/A 07/24/2014    Procedure: Right/Left Heart Cath and Coronary Angiography;  Surgeon: Iran Ouch, MD;  Location: [facility];  Service: Cardiovascular;  Laterality: N/A;  . Cardiac catheterization N/A 07/22/2014    Procedure: Fluoroscopy Guidance;  Surgeon: Lennette Bihari, MD;  Location: [facility];   Service: Cardiovascular;  Laterality: N/A;  . Mitral valve replacement N/A 07/25/2014    Procedure: REDO MITRAL VALVE REPLACEMENT (MVR);  Surgeon: Purcell Nails, MD;  Location: [facility] OR;  Service: Open Heart Surgery;  Laterality: N/A;   Allergies  Allergen Reactions  . Augmentin [Amoxicillin-Pot Clavulanate]    Prior to Admission medications   Medication Sig Start Date End Date Taking? Authorizing Provider  amiodarone (PACERONE) 200 MG tablet Take 1 tablet (200 mg total) by mouth daily. 08/29/14  Yes Purcell Nails, MD  aspirin EC 81 MG EC tablet Take 1 tablet (81 mg total) by mouth daily. 08/10/14  Yes Daniel J Angiulli, PA-C  carvedilol (COREG) 6.25 MG tablet Take 0.5 tablets (3.125 mg total) by mouth 2 (two) times daily with a meal. 09/22/14  Yes Dwana Melena, PA-C  furosemide (LASIX) 40 MG tablet Take 1 tablet (40 mg total) by mouth daily. 09/14/14  Yes Chrystie Nose, MD  pantoprazole (PROTONIX) 40 MG tablet Take 1 tablet (40 mg total) by mouth daily. 10/18/14  Yes Ranelle Oyster, MD  potassium chloride SA (K-DUR,KLOR-CON) 20 MEQ tablet TAKE 1 TABLET BY MOUTH ONCE DAILY 11/03/14  Yes Chrystie Nose, MD  saccharomyces boulardii (FLORASTOR) 250 MG capsule Take 1 capsule (250 mg total) by mouth 2 (two) times daily. 08/10/14  Yes Daniel J Angiulli, PA-C  simvastatin (ZOCOR) 40 MG tablet Take 1 tablet (40 mg total) by mouth daily. 08/10/14  Yes Daniel J Angiulli, PA-C  warfarin (COUMADIN) 2 MG tablet Take 1 tablet by mouth daily or as directed by coumadin clinic 09/19/14  Yes Chrystie Nose, MD   Social History   Social History  . Marital Status: Divorced    Spouse Name: N/A  . Number of Children: 3  . Years of Education: 14   Occupational History  .       Social History Main Topics  . Smoking status: Former Smoker    Quit date: 01/17/1973  . Smokeless tobacco: Never Used  . Alcohol Use: No  . Drug Use: No  . Sexual Activity: Not on file   Other Topics Concern  . Not on file    Social History Narrative   Review of Systems  Constitutional: Positive for chills and fatigue. Negative for fever and appetite change.  HENT: Positive for congestion and rhinorrhea.   Respiratory: Positive for cough and shortness of breath.   Gastrointestinal: Positive for abdominal pain. Negative for vomiting and diarrhea.       Objective:   Physical Exam  Constitutional: She is oriented to person, place, and time. She appears well-developed and well-nourished. No distress.  HENT:  Head: Normocephalic and atraumatic.  Mouth/Throat: Oropharynx is clear and moist. No oropharyngeal exudate.  Eyes: EOM are normal. Pupils are equal, round, and reactive to light.  Neck: Neck supple.  Cardiovascular: Normal rate.   Murmur (3-4 / 6 systolic murmur left and right sternal border greater than apex) heard. Pulmonary/Chest: Effort normal. No respiratory distress. She has no wheezes. She has no rales.  Distant breath sounds but no apparent rales, ronchi or wheeze  Abdominal: Soft. Bowel sounds are normal. She exhibits no distension.  There is no tenderness.  Musculoskeletal: Normal range of motion.  Neurological: She is alert and oriented to person, place, and time.  Skin: Skin is warm and dry.  Psychiatric: She has a normal mood and affect. Her behavior is normal.  Nursing note and vitals reviewed.   Filed Vitals:   12/13/14 1655  BP: 126/74  Pulse: 91  Temp: 97.5 F (36.4 C)  TempSrc: Oral  Resp: 17  Height: 5\' 5"  (1.651 m)  Weight: 151 lb (68.493 kg)  SpO2: 94%   Wt Readings from Last 3 Encounters:  12/13/14 151 lb (68.493 kg)  11/07/14 148 lb (67.132 kg)  09/22/14 145 lb 4.8 oz (65.908 kg)    [facility] reading (PRIMARY) by Dr. Neva Seat : chest xray: increased markings in left and right lower lobes, possible atelectasis versus early infiltrate, questionable effusion right lower lobe compared to august 22nd  Results for orders placed or performed in visit on 12/13/14  POCT CBC   Result Value Ref Range   WBC 4.5 (A) 4.6 - 10.2 K/uL   Lymph, poc 1.6 0.6 - 3.4   POC LYMPH PERCENT 34.7 10 - 50 %L   MID (cbc) 0.4 0 - 0.9   POC MID % 9.4 0 - 12 %M   POC Granulocyte 2.5 2 - 6.9   Granulocyte percent 55.9 37 - 80 %G   RBC 4.70 4.04 - 5.48 M/uL   Hemoglobin 12.3 12.2 - 16.2 g/dL   HCT, POC 29.5 (A) 28.4 - 47.9 %   MCV 77.5 (A) 80 - 97 fL   MCH, POC 26.2 (A) 27 - 31.2 pg   MCHC 33.8 31.8 - 35.4 g/dL   RDW, POC 13.2 %   Platelet Count, POC 286 142 - 424 K/uL   MPV 7.1 0 - 99.8 fL   CXR report:   IMPRESSION: Cardiomegaly with vascular congestion and bibasilar edema versus pneumonia.  Small right pleural effusion.      Assessment & Plan:   [name] is a 69 y.o. female Cough - Plan: DG Chest 2 View, POCT CBC, Brain natriuretic peptide, doxycycline (VIBRA-TABS) 100 MG tablet  Shortness of breath - Plan: DG Chest 2 View, POCT CBC, Brain natriuretic peptide, CANCELED: Brain natriuretic peptide  History of mitral valve replacement - Plan: Brain natriuretic peptide  CAP (community acquired pneumonia) - Plan: Brain natriuretic peptide, doxycycline (VIBRA-TABS) 100 MG tablet  Diastolic congestive heart failure, unspecified congestive heart failure chronicity (HCC) - Plan: Brain natriuretic peptide  Hx of diastolic CHF, with complicated cardiac history in July and Mitral valve replacement.  Now with cough/initial URI symptoms, with progression of cough to chest, productive of yellow phlegm. possible early pneumonia based on hx and CXR findings, but afebrile without leukocytosis, reassuring O2 sat and respiratory effort. DDX also includes CHF as couse of increased cough and possible bibasilar edema. 6 pound weight gain since September. No LE edema or audible rales on exam.   -BNP ordered stat.   -initially planned on levaquin with comorbidities, but this poses risk (as does azithromycin) of QT prolongation with Amiodarone. Discussed with cardiologist on  call - recommended against these as well. Augmentin allergy. Decided on doxycycline as alternative PNA regimen, with close follow up in 48 hours. Discussed choice and reasoning with patient and daughter.   -advised pt to call coumadin provider tomorrow to decide on follow up interval or med changes as on doxycycline which may affect level.   -mucinex if needed for cough - samples #  4 given.   -ER/911 precautions given. Recheck 48hrs.    See lab results - markedly elevated BNP. Called pt and daughter  - left VM around midnight to go to ER. Noted this am she has been admitted.   Meds ordered this encounter  Medications  . doxycycline (VIBRA-TABS) 100 MG tablet    Sig: Take 1 tablet (100 mg total) by mouth 2 (two) times daily.    Dispense:  20 tablet    Refill:  0   Patient Instructions  You likely had a cold virus initially, but now may be early pneumonia vesus some congestion in your lungs from heart seen on xray tonight. There is a heart failure test still pending, but for now - continue the lasix  once per day.   I am waiting on a call back form the cardiologist regarding antibiotic for possible pneumonia and I can call you with that information later tonight.   Follow up with me in next 48 hours unless we call and advise you otherwise.   Return to the clinic or go to the nearest emergency room if any of your symptoms worsen or new symptoms occur - including any new or worsening shortness of breath, chest pain or other worsening.    Cough, Adult Coughing is a reflex that clears your throat and your airways. Coughing helps to heal and protect your lungs. It is normal to cough occasionally, but a cough that happens with other symptoms or lasts a long time may be a sign of a condition that needs treatment. A cough may last only 2-3 weeks (acute), or it may last longer than 8 weeks (chronic). CAUSES Coughing is commonly caused by:  Breathing in substances that irritate your lungs.  A  viral or bacterial respiratory infection.  Allergies.  Asthma.  Postnasal drip.  Smoking.  Acid backing up from the stomach into the esophagus (gastroesophageal reflux).  Certain medicines.  Chronic lung problems, including COPD (or rarely, lung cancer).  Other medical conditions such as heart failure. HOME CARE INSTRUCTIONS  Pay attention to any changes in your symptoms. Take these actions to help with your discomfort:  Take medicines only as told by your health care provider.  If you were prescribed an antibiotic medicine, take it as told by your health care provider. Do not stop taking the antibiotic even if you start to feel better.  Talk with your health care provider before you take a cough suppressant medicine.  Drink enough fluid to keep your urine clear or pale yellow.  If the air is dry, use a cold steam vaporizer or humidifier in your bedroom or your home to help loosen secretions.  Avoid anything that causes you to cough at work or at home.  If your cough is worse at night, try sleeping in a semi-upright position.  Avoid cigarette smoke. If you smoke, quit smoking. If you need help quitting, ask your health care provider.  Avoid caffeine.  Avoid alcohol.  Rest as needed. SEEK MEDICAL CARE IF:   You have new symptoms.  You cough up pus.  Your cough does not get better after 2-3 weeks, or your cough gets worse.  You cannot control your cough with suppressant medicines and you are losing sleep.  You develop pain that is getting worse or pain that is not controlled with pain medicines.  You have a fever.  You have unexplained weight loss.  You have night sweats. SEEK IMMEDIATE MEDICAL CARE IF:  You cough up  blood.  You have difficulty breathing.  Your heartbeat is very fast.   This information is not intended to replace advice given to you by your health care provider. Make sure you discuss any questions you have with your health care  provider.   Document Released: 06/22/2010 Document Revised: 09/14/2014 Document Reviewed: 03/02/2014 Elsevier Interactive Patient Education 2016 [employer]   Community-Acquired Pneumonia, Adult Pneumonia is an infection of the lungs. There are different types of pneumonia. One type can develop while a person is in a hospital. A different type, called community-acquired pneumonia, develops in people who are not, or have not recently been, in the hospital or other health care facility.  CAUSES Pneumonia may be caused by bacteria, viruses, or funguses. Community-acquired pneumonia is often caused by Streptococcus pneumonia bacteria. These bacteria are often passed from one person to another by breathing in droplets from the cough or sneeze of an infected person. RISK FACTORS The condition is more likely to develop in:  People who havechronic diseases, such as chronic obstructive pulmonary disease (COPD), asthma, congestive heart failure, cystic fibrosis, diabetes, or kidney disease.  People who haveearly-stage or late-stage HIV.  People who havesickle cell disease.  People who havehad their spleen removed (splenectomy).  People who havepoor Administrator.  People who havemedical conditions that increase the risk of breathing in (aspirating) secretions their own mouth and nose.   People who havea weakened immune system (immunocompromised).  People who smoke.  People whotravel to areas where pneumonia-causing germs commonly exist.  People whoare around animal habitats or animals that have pneumonia-causing germs, including birds, bats, rabbits, cats, and farm animals. SYMPTOMS Symptoms of this condition include:  Adry cough.  A wet (productive) cough.  Fever.  Sweating.  Chest pain, especially when breathing deeply or coughing.  Rapid breathing or difficulty breathing.  Shortness of breath.  Shaking chills.  Fatigue.  Muscle aches. DIAGNOSIS Your  health care provider will take a medical history and perform a physical exam. You may also have other tests, including:  Imaging studies of your chest, including X-rays.  Tests to check your blood oxygen level and other blood gases.  Other tests on blood, mucus (sputum), fluid around your lungs (pleural fluid), and urine. If your pneumonia is severe, other tests may be done to identify the specific cause of your illness. TREATMENT The type of treatment that you receive depends on many factors, such as the cause of your pneumonia, the medicines you take, and other medical conditions that you have. For most adults, treatment and recovery from pneumonia may occur at home. In some cases, treatment must happen in a hospital. Treatment may include:  Antibiotic medicines, if the pneumonia was caused by bacteria.  Antiviral medicines, if the pneumonia was caused by a virus.  Medicines that are given by mouth or through an IV tube.  Oxygen.  Respiratory therapy. Although rare, treating severe pneumonia may include:  Mechanical ventilation. This is done if you are not breathing well on your own and you cannot maintain a safe blood oxygen level.  Thoracentesis. This procedureremoves fluid around one lung or both lungs to help you breathe better. HOME CARE INSTRUCTIONS  Take over-the-counter and prescription medicines only as told by your health care provider.  Only takecough medicine if you are losing sleep. Understand that cough medicine can prevent your body's natural ability to remove mucus from your lungs.  If you were prescribed an antibiotic medicine, take it as told by your health care provider. Do  not stop taking the antibiotic even if you start to feel better.  Sleep in a semi-upright position at night. Try sleeping in a reclining chair, or place a few pillows under your head.  Do not use tobacco products, including cigarettes, chewing tobacco, and e-cigarettes. If you need help  quitting, ask your health care provider.  Drink enough water to keep your urine clear or pale yellow. This will help to thin out mucus secretions in your lungs. PREVENTION There are ways that you can decrease your risk of developing community-acquired pneumonia. Consider getting a pneumococcal vaccine if:  You are older than 67 years of age.  You are older than 67 years of age and are undergoing cancer treatment, have chronic lung disease, or have other medical conditions that affect your immune system. Ask your health care provider if this applies to you. There are different types and schedules of pneumococcal vaccines. Ask your health care provider which vaccination option is best for you. You may also prevent community-acquired pneumonia if you take these actions:  Get an influenza vaccine every year. Ask your health care provider which type of influenza vaccine is best for you.  Go to the dentist on a regular basis.  Wash your hands often. Use hand sanitizer if soap and water are not available. SEEK MEDICAL CARE IF:  You have a fever.  You are losing sleep because you cannot control your cough with cough medicine. SEEK IMMEDIATE MEDICAL CARE IF:  You have worsening shortness of breath.  You have increased chest pain.  Your sickness becomes worse, especially if you are an older adult or have a weakened immune system.  You cough up blood.   This information is not intended to replace advice given to you by your health care provider. Make sure you discuss any questions you have with your health care provider.   Document Released: 12/24/2004 Document Revised: 09/14/2014 Document Reviewed: 04/20/2014 Elsevier Interactive Patient Education [employer]       By signing my name below, I, [name], attest that this documentation has been prepared under the direction and in the presence of Margaret Staggers, MD. Electronically Signed: Stann Rodriguez, Scribe. 12/13/2014  , 5:15 PM .  I personally performed the services described in this documentation, which was scribed in my presence. The recorded information has been reviewed and considered, and addended by me as needed.

## 2014-12-13 NOTE — Patient Instructions (Addendum)
You likely had a cold virus initially, but now may be early pneumonia vesus some congestion in your lungs from heart seen on xray tonight. There is a heart failure test still pending, but for now - continue the lasix  once per day.   I am waiting on a call back form the cardiologist regarding antibiotic for possible pneumonia and I can call you with that information later tonight.   Follow up with me in next 48 hours unless we call and advise you otherwise.   Return to the clinic or go to the nearest emergency room if any of your symptoms worsen or new symptoms occur - including any new or worsening shortness of breath, chest pain or other worsening.    Cough, Adult Coughing is a reflex that clears your throat and your airways. Coughing helps to heal and protect your lungs. It is normal to cough occasionally, but a cough that happens with other symptoms or lasts a long time may be a sign of a condition that needs treatment. A cough may last only 2-3 weeks (acute), or it may last longer than 8 weeks (chronic). CAUSES Coughing is commonly caused by:  Breathing in substances that irritate your lungs.  A viral or bacterial respiratory infection.  Allergies.  Asthma.  Postnasal drip.  Smoking.  Acid backing up from the stomach into the esophagus (gastroesophageal reflux).  Certain medicines.  Chronic lung problems, including COPD (or rarely, lung cancer).  Other medical conditions such as heart failure. HOME CARE INSTRUCTIONS  Pay attention to any changes in your symptoms. Take these actions to help with your discomfort:  Take medicines only as told by your health care provider.  If you were prescribed an antibiotic medicine, take it as told by your health care provider. Do not stop taking the antibiotic even if you start to feel better.  Talk with your health care provider before you take a cough suppressant medicine.  Drink enough fluid to keep your urine clear or pale  yellow.  If the air is dry, use a cold steam vaporizer or humidifier in your bedroom or your home to help loosen secretions.  Avoid anything that causes you to cough at work or at home.  If your cough is worse at night, try sleeping in a semi-upright position.  Avoid cigarette smoke. If you smoke, quit smoking. If you need help quitting, ask your health care provider.  Avoid caffeine.  Avoid alcohol.  Rest as needed. SEEK MEDICAL CARE IF:   You have new symptoms.  You cough up pus.  Your cough does not get better after 2-3 weeks, or your cough gets worse.  You cannot control your cough with suppressant medicines and you are losing sleep.  You develop pain that is getting worse or pain that is not controlled with pain medicines.  You have a fever.  You have unexplained weight loss.  You have night sweats. SEEK IMMEDIATE MEDICAL CARE IF:  You cough up blood.  You have difficulty breathing.  Your heartbeat is very fast.   This information is not intended to replace advice given to you by your health care provider. Make sure you discuss any questions you have with your health care provider.   Document Released: 06/22/2010 Document Revised: 09/14/2014 Document Reviewed: 03/02/2014 Elsevier Interactive Patient Education 2016 Else[employer] Inc.   Community-Acquired Pneumonia, Adult Pneumonia is an infection of the lungs. There are different types of pneumonia. One type can develop while a person is in a  hospital. A different type, called community-acquired pneumonia, develops in people who are not, or have not recently been, in the hospital or other health care facility.  CAUSES Pneumonia may be caused by bacteria, viruses, or funguses. Community-acquired pneumonia is often caused by Streptococcus pneumonia bacteria. These bacteria are often passed from one person to another by breathing in droplets from the cough or sneeze of an infected person. RISK FACTORS The condition is  more likely to develop in:  People who havechronic diseases, such as chronic obstructive pulmonary disease (COPD), asthma, congestive heart failure, cystic fibrosis, diabetes, or kidney disease.  People who haveearly-stage or late-stage HIV.  People who havesickle cell disease.  People who havehad their spleen removed (splenectomy).  People who havepoor Administrator.  People who havemedical conditions that increase the risk of breathing in (aspirating) secretions their own mouth and nose.   People who havea weakened immune system (immunocompromised).  People who smoke.  People whotravel to areas where pneumonia-causing germs commonly exist.  People whoare around animal habitats or animals that have pneumonia-causing germs, including birds, bats, rabbits, cats, and farm animals. SYMPTOMS Symptoms of this condition include:  Adry cough.  A wet (productive) cough.  Fever.  Sweating.  Chest pain, especially when breathing deeply or coughing.  Rapid breathing or difficulty breathing.  Shortness of breath.  Shaking chills.  Fatigue.  Muscle aches. DIAGNOSIS Your health care provider will take a medical history and perform a physical exam. You may also have other tests, including:  Imaging studies of your chest, including X-rays.  Tests to check your blood oxygen level and other blood gases.  Other tests on blood, mucus (sputum), fluid around your lungs (pleural fluid), and urine. If your pneumonia is severe, other tests may be done to identify the specific cause of your illness. TREATMENT The type of treatment that you receive depends on many factors, such as the cause of your pneumonia, the medicines you take, and other medical conditions that you have. For most adults, treatment and recovery from pneumonia may occur at home. In some cases, treatment must happen in a hospital. Treatment may include:  Antibiotic medicines, if the pneumonia was caused by  bacteria.  Antiviral medicines, if the pneumonia was caused by a virus.  Medicines that are given by mouth or through an IV tube.  Oxygen.  Respiratory therapy. Although rare, treating severe pneumonia may include:  Mechanical ventilation. This is done if you are not breathing well on your own and you cannot maintain a safe blood oxygen level.  Thoracentesis. This procedureremoves fluid around one lung or both lungs to help you breathe better. HOME CARE INSTRUCTIONS  Take over-the-counter and prescription medicines only as told by your health care provider.  Only takecough medicine if you are losing sleep. Understand that cough medicine can prevent your body's natural ability to remove mucus from your lungs.  If you were prescribed an antibiotic medicine, take it as told by your health care provider. Do not stop taking the antibiotic even if you start to feel better.  Sleep in a semi-upright position at night. Try sleeping in a reclining chair, or place a few pillows under your head.  Do not use tobacco products, including cigarettes, chewing tobacco, and e-cigarettes. If you need help quitting, ask your health care provider.  Drink enough water to keep your urine clear or pale yellow. This will help to thin out mucus secretions in your lungs. PREVENTION There are ways that you can decrease your risk  of developing community-acquired pneumonia. Consider getting a pneumococcal vaccine if:  You are older than 67 years of age.  You are older than 67 years of age and are undergoing cancer treatment, have chronic lung disease, or have other medical conditions that affect your immune system. Ask your health care provider if this applies to you. There are different types and schedules of pneumococcal vaccines. Ask your health care provider which vaccination option is best for you. You may also prevent community-acquired pneumonia if you take these actions:  Get an influenza vaccine  every year. Ask your health care provider which type of influenza vaccine is best for you.  Go to the dentist on a regular basis.  Wash your hands often. Use hand sanitizer if soap and water are not available. SEEK MEDICAL CARE IF:  You have a fever.  You are losing sleep because you cannot control your cough with cough medicine. SEEK IMMEDIATE MEDICAL CARE IF:  You have worsening shortness of breath.  You have increased chest pain.  Your sickness becomes worse, especially if you are an older adult or have a weakened immune system.  You cough up blood.   This information is not intended to replace advice given to you by your health care provider. Make sure you discuss any questions you have with your health care provider.   Document Released: 12/24/2004 Document Revised: 09/14/2014 Document Reviewed: 04/20/2014 Elsevier Interactive Patient Education [employer]

## 2014-12-14 ENCOUNTER — Encounter (HOSPITAL_COMMUNITY): Payer: Self-pay | Admitting: Emergency Medicine

## 2014-12-14 ENCOUNTER — Emergency Department (HOSPITAL_COMMUNITY): Payer: Medicare Other

## 2014-12-14 ENCOUNTER — Inpatient Hospital Stay (HOSPITAL_COMMUNITY)
Admission: EM | Admit: 2014-12-14 | Discharge: 2014-12-17 | DRG: 286 | Disposition: A | Discharge status: Home / Self Care | Payer: Medicare Other | Attending: Cardiology | Admitting: Cardiology

## 2014-12-14 ENCOUNTER — Inpatient Hospital Stay (HOSPITAL_COMMUNITY): Payer: Medicare Other

## 2014-12-14 DIAGNOSIS — I5043 Acute on chronic combined systolic (congestive) and diastolic (congestive) heart failure: Secondary | ICD-10-CM | POA: Diagnosis present

## 2014-12-14 DIAGNOSIS — Z7902 Long term (current) use of antithrombotics/antiplatelets: Secondary | ICD-10-CM | POA: Diagnosis not present

## 2014-12-14 DIAGNOSIS — I4891 Unspecified atrial fibrillation: Secondary | ICD-10-CM | POA: Diagnosis present

## 2014-12-14 DIAGNOSIS — Z881 Allergy status to other antibiotic agents status: Secondary | ICD-10-CM | POA: Diagnosis not present

## 2014-12-14 DIAGNOSIS — Z952 Presence of prosthetic heart valve: Secondary | ICD-10-CM

## 2014-12-14 DIAGNOSIS — Z87891 Personal history of nicotine dependence: Secondary | ICD-10-CM

## 2014-12-14 DIAGNOSIS — Z801 Family history of malignant neoplasm of trachea, bronchus and lung: Secondary | ICD-10-CM

## 2014-12-14 DIAGNOSIS — Z954 Presence of other heart-valve replacement: Secondary | ICD-10-CM

## 2014-12-14 DIAGNOSIS — I509 Heart failure, unspecified: Secondary | ICD-10-CM

## 2014-12-14 DIAGNOSIS — N183 Chronic kidney disease, stage 3 (moderate): Secondary | ICD-10-CM | POA: Diagnosis present

## 2014-12-14 DIAGNOSIS — K219 Gastro-esophageal reflux disease without esophagitis: Secondary | ICD-10-CM | POA: Diagnosis present

## 2014-12-14 DIAGNOSIS — I130 Hypertensive heart and chronic kidney disease with heart failure and stage 1 through stage 4 chronic kidney disease, or unspecified chronic kidney disease: Secondary | ICD-10-CM | POA: Diagnosis not present

## 2014-12-14 DIAGNOSIS — Z8249 Family history of ischemic heart disease and other diseases of the circulatory system: Secondary | ICD-10-CM

## 2014-12-14 DIAGNOSIS — N179 Acute kidney failure, unspecified: Secondary | ICD-10-CM | POA: Diagnosis present

## 2014-12-14 DIAGNOSIS — I480 Paroxysmal atrial fibrillation: Secondary | ICD-10-CM | POA: Diagnosis not present

## 2014-12-14 DIAGNOSIS — Z8674 Personal history of sudden cardiac arrest: Secondary | ICD-10-CM

## 2014-12-14 DIAGNOSIS — E785 Hyperlipidemia, unspecified: Secondary | ICD-10-CM | POA: Diagnosis present

## 2014-12-14 DIAGNOSIS — I351 Nonrheumatic aortic (valve) insufficiency: Secondary | ICD-10-CM | POA: Diagnosis present

## 2014-12-14 DIAGNOSIS — I4510 Unspecified right bundle-branch block: Secondary | ICD-10-CM | POA: Diagnosis present

## 2014-12-14 DIAGNOSIS — I10 Essential (primary) hypertension: Secondary | ICD-10-CM | POA: Diagnosis not present

## 2014-12-14 DIAGNOSIS — Z86718 Personal history of other venous thrombosis and embolism: Secondary | ICD-10-CM | POA: Diagnosis not present

## 2014-12-14 DIAGNOSIS — Z7982 Long term (current) use of aspirin: Secondary | ICD-10-CM

## 2014-12-14 DIAGNOSIS — I5031 Acute diastolic (congestive) heart failure: Secondary | ICD-10-CM | POA: Diagnosis not present

## 2014-12-14 DIAGNOSIS — R05 Cough: Secondary | ICD-10-CM | POA: Diagnosis not present

## 2014-12-14 DIAGNOSIS — I110 Hypertensive heart disease with heart failure: Secondary | ICD-10-CM | POA: Diagnosis present

## 2014-12-14 DIAGNOSIS — Z880 Allergy status to penicillin: Secondary | ICD-10-CM

## 2014-12-14 DIAGNOSIS — R0602 Shortness of breath: Secondary | ICD-10-CM | POA: Diagnosis not present

## 2014-12-14 DIAGNOSIS — I481 Persistent atrial fibrillation: Secondary | ICD-10-CM

## 2014-12-14 DIAGNOSIS — I5023 Acute on chronic systolic (congestive) heart failure: Secondary | ICD-10-CM | POA: Diagnosis not present

## 2014-12-14 DIAGNOSIS — N289 Disorder of kidney and ureter, unspecified: Secondary | ICD-10-CM | POA: Diagnosis not present

## 2014-12-14 DIAGNOSIS — I272 Other secondary pulmonary hypertension: Secondary | ICD-10-CM | POA: Diagnosis present

## 2014-12-14 DIAGNOSIS — I428 Other cardiomyopathies: Secondary | ICD-10-CM | POA: Diagnosis present

## 2014-12-14 DIAGNOSIS — Z953 Presence of xenogenic heart valve: Secondary | ICD-10-CM | POA: Diagnosis not present

## 2014-12-14 HISTORY — DX: Heart failure, unspecified: I50.9

## 2014-12-14 LAB — BASIC METABOLIC PANEL
Anion gap: 7 (ref 5–15)
BUN: 19 mg/dL (ref 6–20)
CHLORIDE: 111 mmol/L (ref 101–111)
CO2: 22 mmol/L (ref 22–32)
CREATININE: 1.81 mg/dL — AB (ref 0.44–1.00)
Calcium: 10.4 mg/dL — ABNORMAL HIGH (ref 8.9–10.3)
GFR, EST AFRICAN AMERICAN: 32 mL/min — AB (ref >60)
GFR, EST NON AFRICAN AMERICAN: 28 mL/min — AB (ref >60)
Glucose, Bld: 157 mg/dL — ABNORMAL HIGH (ref 65–99)
POTASSIUM: 4.9 mmol/L (ref 3.5–5.1)
SODIUM: 140 mmol/L (ref 135–145)

## 2014-12-14 LAB — CBC
HCT: 39 % (ref 36.0–46.0)
Hemoglobin: 12.3 g/dL (ref 12.0–15.0)
MCH: 25.6 pg — ABNORMAL LOW (ref 26.0–34.0)
MCHC: 31.5 g/dL (ref 30.0–36.0)
MCV: 81.3 fL (ref 78.0–100.0)
PLATELETS: 295 10*3/uL (ref 150–400)
RBC: 4.8 MIL/uL (ref 3.87–5.11)
RDW: 18.1 % — AB (ref 11.5–15.5)
WBC: 5.4 10*3/uL (ref 4.0–10.5)

## 2014-12-14 LAB — HEPATIC FUNCTION PANEL
ALBUMIN: 3.3 g/dL — AB (ref 3.5–5.0)
ALK PHOS: 93 U/L (ref 38–126)
ALT: 38 U/L (ref 14–54)
AST: 36 U/L (ref 15–41)
BILIRUBIN INDIRECT: 1.1 mg/dL — AB (ref 0.3–0.9)
Bilirubin, Direct: 0.3 mg/dL (ref 0.1–0.5)
TOTAL PROTEIN: 7.6 g/dL (ref 6.5–8.1)
Total Bilirubin: 1.4 mg/dL — ABNORMAL HIGH (ref 0.3–1.2)

## 2014-12-14 LAB — I-STAT CG4 LACTIC ACID, ED: Lactic Acid, Venous: 1.38 mmol/L (ref 0.5–2.0)

## 2014-12-14 LAB — APTT: aPTT: 46 seconds — ABNORMAL HIGH (ref 24–37)

## 2014-12-14 LAB — PROTIME-INR
INR: 3.71 — AB (ref 0.00–1.49)
Prothrombin Time: 35.9 seconds — ABNORMAL HIGH (ref 11.6–15.2)

## 2014-12-14 MED ORDER — FUROSEMIDE 10 MG/ML IJ SOLN: 40.0000 mg | Freq: Every day | INTRAMUSCULAR | Status: DC

## 2014-12-14 MED ORDER — WARFARIN - PHARMACIST DOSING INPATIENT: Freq: Every day | Status: DC

## 2014-12-14 MED ORDER — AMIODARONE HCL 200 MG PO TABS
200.0000 mg | ORAL_TABLET | Freq: Every day | ORAL | Status: DC
  Administered 2014-12-14 – 2014-12-17 (×4): 200 mg via ORAL
  Filled 2014-12-14 (×4): qty 1

## 2014-12-14 MED ORDER — NITROGLYCERIN 0.4 MG SL SUBL
0.4000 mg | SUBLINGUAL_TABLET | SUBLINGUAL | Status: DC
  Administered 2014-12-14 (×3): 0.4 mg via SUBLINGUAL
  Filled 2014-12-14: qty 1

## 2014-12-14 MED ORDER — FUROSEMIDE 10 MG/ML IJ SOLN
40.0000 mg | Freq: Two times a day (BID) | INTRAMUSCULAR | Status: DC
Start: 2014-12-14 — End: 2014-12-16
  Administered 2014-12-14 – 2014-12-16 (×4): 40 mg via INTRAVENOUS
  Filled 2014-12-14 (×4): qty 4

## 2014-12-14 MED ORDER — FUROSEMIDE 10 MG/ML IJ SOLN
40.0000 mg | Freq: Once | INTRAMUSCULAR | Status: AC
  Administered 2014-12-14: 40 mg via INTRAVENOUS
  Filled 2014-12-14: qty 4

## 2014-12-14 MED ORDER — ASPIRIN EC 81 MG PO TBEC
81.0000 mg | DELAYED_RELEASE_TABLET | Freq: Every day | ORAL | Status: DC
  Administered 2014-12-14 – 2014-12-17 (×4): 81 mg via ORAL
  Filled 2014-12-14 (×4): qty 1

## 2014-12-14 MED ORDER — FUROSEMIDE 10 MG/ML IJ SOLN: 40.0000 mg | Freq: Once | INTRAMUSCULAR | Status: DC

## 2014-12-14 MED ORDER — HYDRALAZINE HCL 20 MG/ML IJ SOLN: 5.0000 mg | Freq: Three times a day (TID) | INTRAMUSCULAR | Status: DC | PRN

## 2014-12-14 MED ORDER — PANTOPRAZOLE SODIUM 40 MG PO TBEC
40.0000 mg | DELAYED_RELEASE_TABLET | Freq: Every day | ORAL | Status: DC
  Administered 2014-12-14 – 2014-12-17 (×4): 40 mg via ORAL
  Filled 2014-12-14 (×5): qty 1

## 2014-12-14 MED ORDER — SIMVASTATIN 40 MG PO TABS
40.0000 mg | ORAL_TABLET | Freq: Every day | ORAL | Status: DC
  Administered 2014-12-14 – 2014-12-17 (×4): 40 mg via ORAL
  Filled 2014-12-14 (×4): qty 1

## 2014-12-14 MED ORDER — LISINOPRIL 5 MG PO TABS
5.0000 mg | ORAL_TABLET | Freq: Every day | ORAL | Status: DC
  Administered 2014-12-14: 5 mg via ORAL
  Filled 2014-12-14: qty 1

## 2014-12-14 MED ORDER — FUROSEMIDE 10 MG/ML IJ SOLN
40.0000 mg | Freq: Two times a day (BID) | INTRAMUSCULAR | Status: DC
  Administered 2014-12-14: 40 mg via INTRAVENOUS
  Filled 2014-12-14: qty 4

## 2014-12-14 MED ORDER — POTASSIUM CHLORIDE CRYS ER 20 MEQ PO TBCR
20.0000 meq | EXTENDED_RELEASE_TABLET | Freq: Every day | ORAL | Status: DC
  Administered 2014-12-14 – 2014-12-17 (×4): 20 meq via ORAL
  Filled 2014-12-14 (×4): qty 1

## 2014-12-14 MED ORDER — CARVEDILOL 3.125 MG PO TABS
3.1250 mg | ORAL_TABLET | Freq: Two times a day (BID) | ORAL | Status: DC
  Administered 2014-12-14 – 2014-12-17 (×8): 3.125 mg via ORAL
  Filled 2014-12-14 (×8): qty 1

## 2014-12-14 NOTE — Progress Notes (Signed)
Patient seen and examined  69 y.o. female with a past medical history significant for HTN, Afib on warfarin and amiodarone, chronic diastolic CHF, and history of MVR s/p thrombosis/cardiac arrest and revision this past summer who presents with worsening dyspnea on exertion for two weeks.  Plan 1. Acute diastolic CHF:  Patient is status post cardiac cath 7/17. Had severe mitral stenosis and pulmonary hypertension but no evidence of obstructive coronary artery disease. The patient presents with subacute dyspnea on exertion, elevated BNP, and pulmonary edema on chest x-ray. Note dry weight of 148 lbs, which is where the patient is at admission. Possibly accelerated hypertension is contributing. Increase Lasix to 40 mg IV every 12 -Echocardiogram pending -Consult to Cardiology,   status post emergency redo mitral valve replacement using a bioprosthetic tissue valve 3 months ago  2. Afib:  CHADS2-VASc 3. -Continue warfarin and amiodarone and carvedilol  3. HTN:  -Continue carvedilol -Start lisinopril 5 mg daily (been held after the patient's cardiac arrest, and not needed since) -Hydralazine only PRN  4. History of mitral valve repair:  -Repeat echocardiogram  5. GERD:  Stable.  -Continue home pantoprazole

## 2014-12-14 NOTE — Progress Notes (Signed)
Utilization review completed.  

## 2014-12-14 NOTE — ED Notes (Addendum)
Pt. reports worsening exertional dyspnea onset yesterday with productive cough seen at [facility] yesterday chest x-ray done / prescribed with oral antibiotic .

## 2014-12-14 NOTE — Progress Notes (Signed)
ANTICOAGULATION CONSULT NOTE - Initial Consult  Pharmacy Consult for Coumadin Indication: Afib/MVR  Allergies  Allergen Reactions  . Augmentin [Amoxicillin-Pot Clavulanate] Other (See Comments)    Unknown reaction    Patient Measurements: Height: 5\' 5"  (165.1 cm) Weight: 146 lb 2.6 oz (66.3 kg) IBW/kg (Calculated) : 57  Vital Signs: Temp: 97.3 F (36.3 C) (12/07 0443) Temp Source: Oral (12/07 0443) BP: 145/95 mmHg (12/07 0443) Pulse Rate: 72 (12/07 0443)  Labs:  Recent Labs  12/13/14 1807 12/14/14 0124 12/14/14 0310  HGB 12.3 12.3  --   HCT 36.5* 39.0  --   PLT  --  295  --   APTT  --   --  46*  LABPROT  --   --  35.9*  INR  --   --  3.71*  CREATININE  --  1.81*  --     Estimated Creatinine Clearance: 27.1 mL/min (by C-G formula based on Cr of 1.81).   Medical History: Past Medical History  Diagnosis Date  . Hypertension   . Hyperlipidemia   . S/P MVR (mitral valve replacement) 04/11/1998    #29 St. Jude bileaflet mechanical valve for bacterial endocarditis  . 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 S/P redo mitral valve replacement with bioprosthetic valve 07/25/2014    27 mm [facility] Mitral bovine bioprosthetic tissue valve    Medications:  Prescriptions prior to admission  Medication Sig Dispense Refill Last Dose  . amiodarone (PACERONE) 200 MG tablet Take 1 tablet (200 mg total) by mouth daily. 60 tablet 0 12/13/2014 at Unknown time  . aspirin EC 81 MG EC tablet Take 1 tablet (81 mg total) by mouth daily.   12/13/2014 at Unknown time  . carvedilol (COREG) 6.25 MG tablet Take 0.5 tablets (3.125 mg total) by mouth 2 (two) times daily with a meal. 180 tablet 3 12/13/2014 at 1800  . furosemide (LASIX) 40 MG tablet Take 1 tablet (40 mg total) by mouth  daily. 30 tablet 5 12/13/2014 at Unknown time  . pantoprazole (PROTONIX) 40 MG tablet Take 1 tablet (40 mg total) by mouth daily. 30 tablet 0 12/13/2014 at Unknown time  . potassium chloride SA (K-DUR,KLOR-CON) 20 MEQ tablet TAKE 1 TABLET BY MOUTH ONCE DAILY 60 tablet 3 12/13/2014 at Unknown time  . saccharomyces boulardii (FLORASTOR) 250 MG capsule Take 1 capsule (250 mg total) by mouth 2 (two) times daily. 60 capsule 0 12/13/2014 at Unknown time  . simvastatin (ZOCOR) 40 MG tablet Take 1 tablet (40 mg total) by mouth daily. 30 tablet 11 12/13/2014 at Unknown time  . warfarin (COUMADIN) 2 MG tablet Take 1 tablet by mouth daily or as directed by coumadin clinic (Patient taking differently: Take 2 mg by mouth daily. ) 30 tablet 1 12/13/2014 at Unknown time   Scheduled:  . amiodarone  200 mg Oral Daily  . aspirin EC  81 mg Oral Daily  . carvedilol  3.125 mg Oral BID WC  . furosemide  40 mg Intravenous Daily  . lisinopril  5 mg Oral Daily  . pantoprazole  40 mg Oral Daily  . potassium chloride SA  20 mEq Oral Daily  . simvastatin  40 mg Oral Daily    Assessment: 69yo female c/o worsening exertional dyspnea and productive cough, [facility]'d [facility]X [facility], CXR shows edema vs atelectasis vs infection, to continue Coumadin during admission; current INR above goal with last Coumadin dose taken 12/6 PTA.  Goal of Therapy:  INR 2-3   Plan:  Will hold Coumadin for now and monitor INR for dose adjustments.  Vernard Gambles, PharmD, BCPS  12/14/2014,4:52 AM

## 2014-12-14 NOTE — ED Notes (Signed)
Pt to xray

## 2014-12-14 NOTE — H&P (Signed)
History and Physical  Patient Name: [name]     YIF:[id number]    DOB: 1945-07-02    DOA: 12/14/2014 Referring physician: Elpidio Anis, PA-C PCP: No PCP Per Patient      Chief Complaint: Dyspnea on exertion  HPI: [name] is a 69 y.o. female with a past medical history significant for HTN, Afib on warfarin and amiodarone, chronic diastolic CHF, and history of MVR s/p thrombosis/cardiac arrest and revision this past summer who presents with worsening dyspnea on exertion for two weeks.  The patient was in her usual state of health until about 2 weeks ago when she had an upper respiratory infection with runny nose and sore throat. Since then she has had increasing shortness of breath. Today her daughter noticed that she couldn't walk around the store without getting short of breath, and had her come over to her house which she noticed that her mother couldn't lie flat or get comfortable while sleeping and so she brought her to the ER. There has been no swelling, increase in abdominal girth, or weight gain noticed. There is no fever, chills, sputum, wheezing.  In the ED, the patient had a chest x-ray with bilateral interstitial markings, and a BNP that was elevated >2000 pg/mL. She was given 40 mg of IV furosemide and [facility] was asked to admit for CHF.     Review of Systems:  Pt complains of dyspnea, dyspnea on exertion, paroxysmal nocturnal dyspnea, orthopnea (nuchanged). Pt denies any fever, sputum, wheezing.  All other systems negative except as just noted or noted in the history of present illness.  Allergies  Allergen Reactions  . Augmentin [Amoxicillin-Pot Clavulanate] Other (See Comments)    Unknown reaction    Prior to Admission medications   Medication Sig Start Date End Date Taking? Authorizing Provider  amiodarone (PACERONE) 200 MG tablet Take 1 tablet (200 mg total) by mouth daily. 08/29/14  Yes Purcell Nails, MD  aspirin EC 81 MG EC tablet Take 1  tablet (81 mg total) by mouth daily. 08/10/14  Yes Daniel J Angiulli, PA-C  carvedilol (COREG) 6.25 MG tablet Take 0.5 tablets (3.125 mg total) by mouth 2 (two) times daily with a meal. 09/22/14  Yes Dwana Melena, PA-C  furosemide (LASIX) 40 MG tablet Take 1 tablet (40 mg total) by mouth daily. 09/14/14  Yes Chrystie Nose, MD  pantoprazole (PROTONIX) 40 MG tablet Take 1 tablet (40 mg total) by mouth daily. 10/18/14  Yes Ranelle Oyster, MD  potassium chloride SA (K-DUR,KLOR-CON) 20 MEQ tablet TAKE 1 TABLET BY MOUTH ONCE DAILY 11/03/14  Yes Chrystie Nose, MD  saccharomyces boulardii (FLORASTOR) 250 MG capsule Take 1 capsule (250 mg total) by mouth 2 (two) times daily. 08/10/14  Yes Daniel J Angiulli, PA-C  simvastatin (ZOCOR) 40 MG tablet Take 1 tablet (40 mg total) by mouth daily. 08/10/14  Yes Daniel J Angiulli, PA-C  warfarin (COUMADIN) 2 MG tablet Take 1 tablet by mouth daily or as directed by coumadin clinic Patient taking differently: Take 2 mg by mouth daily.  09/19/14  Yes Chrystie Nose, MD    Past Medical History  Diagnosis Date  . Hypertension   . Hyperlipidemia   . S/P MVR (mitral valve replacement) 04/11/1998    #29 St. Jude bileaflet mechanical valve for bacterial endocarditis  . 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 Prosthetic valve dysfunction 07/22/2014    Acute mitral stenosis due to restricted leaflet mobility  of bileaflet mechanical mitral valve prosthesis  . 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 S/P redo mitral valve replacement with bioprosthetic valve 07/25/2014    27 mm [facility] Mitral bovine bioprosthetic tissue valve    Past Surgical History  Procedure Laterality Date  . Mitral valve replacement  04/11/1998    St. Jude mechanical MVR (severe MR, episode of streptococcal bacterial endocarditis - Dr. Cornelius Moras  . Tubal ligation    . Transthoracic echocardiogram  11/2009    EF=>55%; bi-leaflet St. Jude mech prosthesis; mild TR, RVSP elevated at  40-21mmHg, mod pulm HTN; trace AV regurg; mild pulm valve regurg  . Total abdominal hysterectomy w/ bilateral salpingoophorectomy  12/14/1998  . Cardiac catheterization N/A 07/24/2014    Procedure: Right/Left Heart Cath and Coronary Angiography;  Surgeon: Iran Ouch, MD;  Location: [facility] INVASIVE CV LAB;  Service: Cardiovascular;  Laterality: N/A;  . Cardiac catheterization N/A 07/22/2014    Procedure: Fluoroscopy Guidance;  Surgeon: Lennette Bihari, MD;  Location: [facility] INVASIVE CV LAB;  Service: Cardiovascular;  Laterality: N/A;  . Mitral valve replacement N/A 07/25/2014    Procedure: REDO MITRAL VALVE REPLACEMENT (MVR);  Surgeon: Purcell Nails, MD;  Location: [facility] OR;  Service: Open Heart Surgery;  Laterality: N/A;    Family history: family history includes Heart failure in her maternal grandfather and maternal grandmother; Lung cancer in her mother.  Social History: Patient lives by herslef.  She is not a smoker, lives by herself, drives and does not walk with a cane. She is independent with all ADLs.       Physical Exam: BP 173/106 mmHg  Pulse 76  Temp(Src) 97.6 F (36.4 C) (Oral)  Resp 22  SpO2 92% General appearance: Well-developed, adult female, alert and in no acute distress.   Eyes: Anicteric, lids and lashes normal.     ENT: No nasal deformity, discharge, or epistaxis.  OP moist without lesions.   Skin: Warm and dry.  Cardiac: RRR, nl S1-S2, there is a musical systolic murmur, not an MR murmur.  JVP appears elevated, HJR positive.  No LE edema.   Respiratory: Normal respiratory rate and rhythm.  CTAB, no wheezes, minimal crackles at bases. Abdomen: Abdomen soft without rigidity.  No TTP. No ascites, distension.   MSK: No deformities or effusions. Neuro: Sensorium intact and responding to questions, attention normal.  Speech is fluent.  Moves all extremities equally and with normal coordination.    Psych: Behavior appropriate.  Affect normal.  No evidence of aural or visual  hallucinations or delusions.       Labs on Admission:  The metabolic panel shows normal sodium, potassium, bicarbonate. Serum creatinine is at her baseline of 1.8 mg/dL. The BNP is elevated to 2395 pg/mL. The lactic acid level is normal. The complete blood count shows no leukocytosis, anemia or thrombocytopenia.   Radiological Exams on Admission: Personally reviewed: Dg Chest 2 View 12/14/2014  IMPRESSION: 1. Similar appearance of the chest with cardiomegaly and diffuse vascular congestion. 2. Small bilateral pleural effusions with bibasilar opacities which may reflect edema, atelectasis, or infection.     EKG: Independently reviewed. Long QTc, rate 89.  Sinus.  RBBB, old.    Assessment/Plan 1. Acute diastolic CHF:  This is new.  The patient presents with subacute dyspnea on exertion, elevated BNP, and pulmonary edema on chest x-ray.  Note dry weight of 148 lbs, which is where the patient is at admission. Possibly accelerated hypertension is contributing. -Furosemide 40 mg IV daily -  Echocardiogram -Consult to Cardiology, appreciate recommendations  2. Afib:  CHADS2-VASc 3. -Continue warfarin and amiodarone and carvedilol  3. HTN:  -Continue carvedilol -Start lisinopril 5 mg daily (been held after the patient's cardiac arrest, and not needed since) -Hydralazine only PRN  4. History of mitral valve repair:  -Repeat echocardiogram  5. GERD:  Stable.  -Continue home pantoprazole    DVT PPx: Warfarin Diet: Cardiac Consultants: Cardiology Code Status: Full Family Communication: Daughter, present at bedside  Medical decision making: What exists of the patient's previous chart was reviewed in depth and the case was discussed with Elpidio Anis. Patient seen 3:34 AM on 12/14/2014.  Disposition Plan:  Admit for IV diuresis, repeat echocardiogram and consultation with cardiology.  Anticipate admission for 2-3 days.      Alberteen Sam [facility] Pager [phone]

## 2014-12-14 NOTE — Care Management Note (Addendum)
Case Management Note Donn Pierini RN, BSN Unit 2W-Case Manager [phone]  Patient Details  Name: [name] MRN: [id number] Date of Birth: [birth year]  Subjective/Objective:   Pt admitted with CHF                 Action/Plan: PTA Pt lived at home- may benefit from [facility] services for CHF management at discharge- noted pt has no PCP- [facility] clinic to see pt regarding possible f/u.- CM to follow progression for d/c needs.  Expected Discharge Date:                  Expected Discharge Plan:  Home w Home Health Services  In-House Referral:     Discharge planning Services  CM Consult  Post Acute Care Choice:    Choice offered to:     DME Arranged:    DME Agency:     HH Arranged:    HH Agency:     Status of Service:  In process, will continue to follow  Medicare Important Message Given:    Date Medicare IM Given:    Medicare IM give by:    Date Additional Medicare IM Given:    Additional Medicare Important Message give by:     If discussed at Long Length of Stay Meetings, dates discussed:    Additional Comments:  Darrold Span, RN 12/14/2014, 12:27 PM

## 2014-12-14 NOTE — Consult Note (Signed)
CARDIOLOGY CONSULT NOTE   Patient ID: [name] MRN: [id number], DOB/AGE: 1945-02-11   Admit date: 12/14/2014 Date of Consult: 12/14/2014 Reason for  Consult: CHF  Primary Physician: No PCP Per Patient Primary Cardiologist: Dr. Rennis Golden  HPI: [name] is a 69 y.o. female with past medical history of MVR (s/p St. Jude mechanical valve 2000, replacement with 26mm Edwards Magna valve in 07/2014 due to thrombosis), HTN, HLD, atrial fibrillation (on Coumadin), and chronic renal insufficiency who presented to [facility] ED on 12/14/2014 for worsening shortness of breath.   The patient reports having a productive cough for 2-3 weeks. She denies any associated fever, chills, nausea, or vomiting. Over the past week, she has developed worsening shortness of breath. She endorses orthopnea along with dyspnea on exertion. Her family notes they have taken her shopping recently and she has to stop for frequent breaks to catch her breath, which is unusual for her. She denies any chest pain, palpitations, increased abdominal girth, or lower extremity edema.  She reports weighing herself once every week or two. Her weight on her home scales most recently was 151 lbs, and she says her weight is usually around 146 lbs. The patient's family report she has been consuming less food and they believe she looks less muscular than before. Therefore, a new dry weight may need to be established for the patient.  On the day of admission, her BNP was found to be elevated to 2395. Her initial CXR showed vascular congestion with bibasilar edema vs. pneumonia. A small right pleural effusion was also noted. A repeat CXR on 12/14/2014 showed a similar appearance of the chest with cardiomegaly and diffuse vascular congestion. Small bilateral pleural effusions with bibasilar opacities were also noted.  She was seen in the office by Wilburt Finlay, PA-C on 09/22/2014 and was doing well at that time. Reports good  compliance with her medications.  Problem List  Past Medical History  Diagnosis Date  . Hypertension   . Hyperlipidemia   . S/P MVR (mitral valve replacement) 04/11/1998    #29 St. Jude bileaflet mechanical valve for bacterial endocarditis  . 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 S/P redo mitral valve replacement with bioprosthetic valve 07/25/2014    27 mm [facility] Mitral bovine bioprosthetic tissue valve    Past Surgical History  Procedure Laterality Date  . Mitral valve replacement  04/11/1998    St. Jude mechanical MVR (severe MR, episode of streptococcal bacterial endocarditis - Dr. Cornelius Moras  . Tubal ligation    . Transthoracic echocardiogram  11/2009    EF=>55%; bi-leaflet St. Jude mech prosthesis; mild TR, RVSP elevated at 40-41mmHg, mod pulm HTN; trace AV regurg; mild pulm valve regurg  . Total abdominal hysterectomy w/ bilateral salpingoophorectomy  12/14/1998  . Cardiac catheterization N/A 07/24/2014    Procedure: Right/Left Heart Cath and Coronary Angiography;  Surgeon: Iran Ouch, MD;  Location: [facility] INVASIVE CV LAB;  Service: Cardiovascular;  Laterality: N/A;  . Cardiac catheterization N/A 07/22/2014    Procedure: Fluoroscopy Guidance;  Surgeon: Lennette Bihari, MD;  Location: [facility] INVASIVE CV LAB;  Service: Cardiovascular;  Laterality: N/A;  . Mitral valve replacement N/A 07/25/2014    Procedure: REDO MITRAL VALVE REPLACEMENT (MVR);  Surgeon: Purcell Nails, MD;  Location: [facility]  OR;  Service: Open Heart Surgery;  Laterality: N/A;     Allergies  Allergies  Allergen Reactions  . Augmentin [Amoxicillin-Pot Clavulanate] Other (See Comments)    Unknown reaction      Inpatient Medications  . amiodarone  200 mg Oral Daily  . aspirin EC  81 mg Oral Daily  .  carvedilol  3.125 mg Oral BID WC  . furosemide  40 mg Intravenous Q12H  . lisinopril  5 mg Oral Daily  . pantoprazole  40 mg Oral Daily  . potassium chloride SA  20 mEq Oral Daily  . simvastatin  40 mg Oral Daily  . Warfarin - Pharmacist Dosing Inpatient   Does not apply q1800    Family History Family History  Problem Relation Age of Onset  . Lung cancer Mother   . Heart failure Maternal Grandmother   . Heart failure Maternal Grandfather      Social History Social History   Social History  . Marital Status: Divorced    Spouse Name: N/A  . Number of Children: 3  . Years of Education: 14   Occupational History  .       Social History Main Topics  . Smoking status: Former Smoker    Quit date: 01/17/1973  . Smokeless tobacco: Never Used  . Alcohol Use: No  . Drug Use: No  . Sexual Activity: Not on file   Other Topics Concern  . Not on file   Social History Narrative     Review of Systems General:  No chills, fever, night sweats or weight changes.  Cardiovascular:  No chest pain, edema, palpitations, paroxysmal nocturnal dyspnea. Positive for dyspnea on exertion and orthopnea. Dermatological: No rash, lesions/masses Respiratory: No cough, dyspnea Urologic: No hematuria, dysuria Abdominal:   No nausea, vomiting, diarrhea, bright red blood per rectum, melena, or hematemesis Neurologic:  No visual changes, wkns, changes in mental status. All other systems reviewed and are otherwise negative except as noted above.  Physical Exam  Blood pressure 148/95, pulse 72, temperature 97.3 F (36.3 C), temperature source Oral, resp. rate 20, height  (1.651 m), weight 146 lb 2.6 oz (66.3 kg), SpO2 95 %.  General: Pleasant, African American female in NAD Psych: Normal affect. Neuro: Alert and oriented X 3. Moves all extremities spontaneously. HEENT: Normal  Neck: Supple without bruits. JVD slightly elevated. Lungs:  Resp regular and unlabored, Rales at bases  bilaterally. Heart: RRR no s3, s4, or murmurs. Abdomen: Soft, non-tender, non-distended, BS + x 4.  Extremities: No clubbing, cyanosis or edema. DP/PT/Radials 2+ and equal bilaterally.  Labs  No results for input(s): CKTOTAL, CKMB, TROPONINI in the last 72 hours. Lab Results  Component Value Date   WBC 5.4 12/14/2014   HGB 12.3 12/14/2014   HCT 39.0 12/14/2014   MCV 81.3 12/14/2014   PLT 295 12/14/2014    Recent Labs Lab 12/14/14 0124  NA 140  K 4.9  CL 111  CO2 22  BUN 19  CREATININE 1.81*  CALCIUM 10.4*  GLUCOSE 157*   Lab Results  Component Value Date   CHOL 169 02/17/2013   HDL 50 02/17/2013   LDLCALC 99 02/17/2013   TRIG 99 02/17/2013   No results found for: DDIMER  Radiology/Studies Dg Chest 2 View: 12/14/2014  CLINICAL DATA:  Initial evaluation for shortness of breath, cough for 2 weeks. EXAM: CHEST  2 VIEW COMPARISON:  Prior study from 12/13/2014. FINDINGS: Median sternotomy wires with underlying prostatic valve again noted. Cardiomegaly is  stable. Mediastinal silhouette within normal limits. Diffuse pulmonary vascular congestion again noted. Small bilateral pleural effusions present, right greater than left. Patchy bibasilar opacities, which may reflect atelectasis, edema, or infection. Overall, changes are similar to prior study. No pneumothorax. Osseous structures are unchanged. Wedging of mid thoracic vertebral body noted, stable. IMPRESSION: 1. Similar appearance of the chest with cardiomegaly and diffuse vascular congestion. 2. Small bilateral pleural effusions with bibasilar opacities which may reflect edema, atelectasis, or infection. Electronically Signed   By: Rise Mu M.D.   On: 12/14/2014 02:43   Dg Chest 2 View: 12/13/2014  CLINICAL DATA:  Cough shortness of breath, history of CHF and mitral valve replacement EXAM: CHEST  2 VIEW COMPARISON:  08/29/2014 FINDINGS: Prior median sternotomy and mitral valve replacement. Stable cardiomegaly. Central  vascular congestion evident with increased bibasilar streaky opacities and small right pleural effusion noted. Basilar edema or pneumonia not excluded. Upper lobes remain clear. No pneumothorax. Trachea is midline. IMPRESSION: Cardiomegaly with vascular congestion and bibasilar edema versus pneumonia. Small right pleural effusion. Electronically Signed   By: Judie Petit.  Shick M.D.   On: 12/13/2014 18:24    ECG: NSR with rate in 80's. Old RBBB.   ECHOCARDIOGRAM: 07/21/2014 (Prior to Re-do MVR) Study Conclusions - Left ventricle: The cavity size was normal. Wall thickness was normal. Systolic function was normal. The estimated ejection fraction was in the range of 55% to 60%. - Mitral valve: A mechanical prosthesis was present and functioning abnormally. The prosthesis posterior leaflet was fixed in the closed position. There was evidence for pannus formation. The findings are consistent with severe stenosis. There was mild regurgitation directed centrally. Mean gradient (D): 27 mm Hg. - Pulmonary arteries: Possible PA thrombus versus shadow artifact. Note INR 2.6 on admission.    ASSESSMENT AND PLAN:  1. Acute on Chronic Diastolic CHF - Echo in 07/2014 showed EF of 55-60%. Repeat echo is pending. - Worsening orthopnea and dyspnea with exertion over the past week. - baseline weight on home scale is 146 lbs. Was up to 151 lbs on her scales PTA. Family believes she has lost actual muscle mass so a new baseline weight will need to be established. She only weighs once every 1-2 weeks. The importance of daily weights was explained to the patient and her family. - CXR shows diffuse vascular congestion and small bilateral pleural effusions. - has been receiving Lasix  IV daily (was on Lasix  PO daily PTA). Consider increasing current dose to Lasix  IV BID. Will need to monitor creatinine closely in setting of CKD. - continue to obtain strict I&O's.  2. Paroxysmal Atrial  Fibrillation - currently in NSR - This patients CHA2DS2-VASc Score and unadjusted Ischemic Stroke Rate (% per year) is equal to 4.8 % stroke rate/year from a score of 4 (CHF, HTN, Age, Female). Continue Coumadin for anticoagulation. - continue Amiodarone and BB.  3. HLD - continue statin  4. S/p MVR in 07/2014 - repeat echo is pending.  5. Stage 3 CKD - baseline 1.8 - 2.2 - 1.81 on 12/14/2014. Continue to monitor with diuresis.  Signed, Ellsworth Lennox, PA-C 12/14/2014, 10:28 AM Pager: [phone]

## 2014-12-14 NOTE — Research (Signed)
REDS_0  Informed Consent   Subject Name: [name]  Subject met inclusion and exclusion criteria.  The informed consent form, study requirements and expectations were reviewed with the subject and questions and concerns were addressed prior to the signing of the consent form.  The subject verbalized understanding of the trail requirements.  The subject agreed to participate in the REDS_1  trial and signed the informed consent.  The informed consent was obtained prior to performance of any protocol-specific procedures for the subject.  A copy of the signed informed consent was given to the subject and a copy was placed in the subject's medical record.  [name] 12/14/2014, 9:52 AM

## 2014-12-14 NOTE — ED Provider Notes (Signed)
CSN: [id number]     Arrival date & time 12/14/14  0103 History   First MD Initiated Contact with Patient 12/14/14 0124     Chief Complaint  Patient presents with  . Shortness of Breath  . Cough     (Consider location/radiation/quality/duration/timing/severity/associated sxs/prior Treatment) Patient is a 69 y.o. female presenting with shortness of breath and cough. The history is provided by the patient. No language interpreter was used.  Shortness of Breath Severity:  Moderate Onset quality:  Gradual Duration:  1 week Timing:  Constant Progression:  Worsening Context: activity   Associated symptoms: cough   Associated symptoms: no abdominal pain, no chest pain, no fever and no vomiting   Associated symptoms comment:  Patient with a history of CHF, CAD, recent valvular surgery, A-fib, coagulopathy, HTN presents with SOB associated initially with cough/URI symptoms one week ago, now DOE. No fever. She denies chest pain. Breathing becomes difficult with speaking in long sentences, walking, lying down. No vomiting. She was seen at Urgent Care last night and diagnosed with CAP vs CHF exacerbation. She was discharged home with antibiotic but was contacted by the examining physician after blood test resulted showing CHF and told to come to the ER. Cough Associated symptoms: shortness of breath   Associated symptoms: no chest pain, no chills and no fever     Past Medical History  Diagnosis Date  . Hypertension   . Hyperlipidemia   . S/P MVR (mitral valve replacement) 04/11/1998    #29 St. Jude bileaflet mechanical valve for bacterial endocarditis  . 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 S/P redo mitral valve replacement with bioprosthetic valve 07/25/2014    27 mm [facility]  Mitral bovine bioprosthetic tissue valve   Past Surgical History  Procedure Laterality Date  . Mitral valve replacement  04/11/1998    St. Jude mechanical MVR (severe MR, episode of streptococcal bacterial endocarditis - Dr. Cornelius Moras  . Tubal ligation    . Transthoracic echocardiogram  11/2009    EF=>55%; bi-leaflet St. Jude mech prosthesis; mild TR, RVSP elevated at 40-56mmHg, mod pulm HTN; trace AV regurg; mild pulm valve regurg  . Total abdominal hysterectomy w/ bilateral salpingoophorectomy  12/14/1998  . Cardiac catheterization N/A 07/24/2014    Procedure: Right/Left Heart Cath and Coronary Angiography;  Surgeon: Iran Ouch, MD;  Location: [facility] INV[facility] CV LAB;  Service: Cardiovascular;  Laterality: N/A;  . Cardiac catheterization N/A 07/22/2014    Procedure: Fluoroscopy Guidance;  Surgeon: Lennette Bihari, MD;  Location: [facility] INVAS[facility] CV LAB;  Service: Cardiovascular;  Laterality: N/A;  . Mitral valve replacement N/A 07/25/2014    Procedure: REDO MITRAL VALVE REPLACEMENT (MVR);  Surgeon: Purcell Nails, MD;  Location: [facility] OR;  Service: Open Heart Surgery;  Laterality: N/A;   Family History  Problem Relation Age of Onset  . Lung cancer Mother   . Heart failure Maternal Grandmother   . Heart failure Maternal Grandfather    Social History  Substance Use Topics  . Smoking status: Former Smoker    Quit date: 01/17/1973  . Smokeless tobacco: Never Used  . Alcohol Use: No   OB History    No data available     Review of Systems  Constitutional: Negative for fever and chills.  HENT: Negative.   Respiratory: Positive  for cough and shortness of breath.   Cardiovascular: Negative.  Negative for chest pain and leg swelling.  Gastrointestinal: Negative.  Negative for vomiting and abdominal pain.  Musculoskeletal: Negative.   Skin: Negative.   Neurological: Negative.       Allergies  Augmentin  Home Medications   Prior to Admission medications   Medication Sig Start Date End Date  Taking? Authorizing Provider  amiodarone (PACERONE) 200 MG tablet Take 1 tablet (200 mg total) by mouth daily. 08/29/14   Purcell Nails, MD  aspirin EC 81 MG EC tablet Take 1 tablet (81 mg total) by mouth daily. 08/10/14   Mcarthur Rossetti Angiulli, PA-C  carvedilol (COREG) 6.25 MG tablet Take 0.5 tablets (3.125 mg total) by mouth 2 (two) times daily with a meal. 09/22/14   Dwana Melena, PA-C  doxycycline (VIBRA-TABS) 100 MG tablet Take 1 tablet (100 mg total) by mouth 2 (two) times daily. 12/13/14   Shade Flood, MD  furosemide (LASIX) 40 MG tablet Take 1 tablet (40 mg total) by mouth daily. 09/14/14   Chrystie Nose, MD  pantoprazole (PROTONIX) 40 MG tablet Take 1 tablet (40 mg total) by mouth daily. 10/18/14   Ranelle Oyster, MD  potassium chloride SA (K-DUR,KLOR-CON) 20 MEQ tablet TAKE 1 TABLET BY MOUTH ONCE DAILY 11/03/14   Chrystie Nose, MD  saccharomyces boulardii (FLORASTOR) 250 MG capsule Take 1 capsule (250 mg total) by mouth 2 (two) times daily. 08/10/14   Mcarthur Rossetti Angiulli, PA-C  simvastatin (ZOCOR) 40 MG tablet Take 1 tablet (40 mg total) by mouth daily. 08/10/14   Mcarthur Rossetti Angiulli, PA-C  warfarin (COUMADIN) 2 MG tablet Take 1 tablet by mouth daily or as directed by coumadin clinic 09/19/14   Chrystie Nose, MD   BP 174/118 mmHg  Pulse 88  Temp(Src) 97.6 F (36.4 C) (Oral)  Resp 20  SpO2 94% Physical Exam  Constitutional: She is oriented to person, place, and time. She appears well-developed and well-nourished.  HENT:  Head: Normocephalic.  Neck: Normal range of motion. Neck supple.  Cardiovascular: Normal rate and regular rhythm.   Pulmonary/Chest: Effort normal. She has rales.  Abdominal: Soft. Bowel sounds are normal. There is no tenderness. There is no rebound and no guarding.  Musculoskeletal: Normal range of motion. She exhibits no edema.  Neurological: She is alert and oriented to person, place, and time.  Skin: Skin is warm and dry. No rash noted.  Psychiatric: She has a  normal mood and affect.    ED Course  Procedures (including critical care time) Labs Review Labs Reviewed  CBC - Abnormal; Notable for the following:    MCH 25.6 (*)    RDW 18.1 (*)    All other components within normal limits  BASIC METABOLIC PANEL  I-STAT TROPOININ, ED  I-STAT CG4 LACTIC ACID, ED    Imaging Review Dg Chest 2 View  12/13/2014  CLINICAL DATA:  Cough shortness of breath, history of CHF and mitral valve replacement EXAM: CHEST  2 VIEW COMPARISON:  08/29/2014 FINDINGS: Prior median sternotomy and mitral valve replacement. Stable cardiomegaly. Central vascular congestion evident with increased bibasilar streaky opacities and small right pleural effusion noted. Basilar edema or pneumonia not excluded. Upper lobes remain clear. No pneumothorax. Trachea is midline. IMPRESSION: Cardiomegaly with vascular congestion and bibasilar edema versus pneumonia. Small right pleural effusion. Electronically Signed   By: Judie Petit.  Shick M.D.   On: 12/13/2014 18:24   I have personally reviewed and evaluated  these images and lab results as part of my medical decision-making.   EKG Interpretation None      MDM   Final diagnoses:  None    1. CHF  The patient is given 40 mg IV Lasix. Discussed her condition with cardiology (Dr. Onalee Hua) who advises medicine to admit and that he would be available to consult as needed. Discussed with Dr. Maryfrances Bunnell who accepts the patient for admission.  Hypertension noted. CHF considered and SL NTG ordered. Patient stable for admission.     Elpidio Anis, PA-C 12/14/14 1610  Tomasita Crumble, MD 12/14/14 [phone]

## 2014-12-14 NOTE — ED Notes (Signed)
Pt back from x-ray.

## 2014-12-14 NOTE — Telephone Encounter (Signed)
Given her history of MVR and cardiac issues in July, along with findings on CXR and significantly elevated BNP, I called patient tonight on her listed number - left voicemail with results and need to be evaluated in the ER tonight. Also left voicemail on her daughter's phone (emergency contact) regarding need to be evaluated in ER for her symptoms. Left VM on both numbers that if they had any questions - to call answering service to get in touch with me, but again needed to be evaluated in ER tonight.

## 2014-12-14 NOTE — Progress Notes (Signed)
  Echocardiogram 2D Echocardiogram has been performed.  Janalyn Harder 12/14/2014, 3:01 PM

## 2014-12-15 ENCOUNTER — Encounter (HOSPITAL_COMMUNITY): Payer: Self-pay | Admitting: General Practice

## 2014-12-15 DIAGNOSIS — N179 Acute kidney failure, unspecified: Secondary | ICD-10-CM

## 2014-12-15 DIAGNOSIS — Z953 Presence of xenogenic heart valve: Secondary | ICD-10-CM

## 2014-12-15 DIAGNOSIS — I5043 Acute on chronic combined systolic (congestive) and diastolic (congestive) heart failure: Secondary | ICD-10-CM | POA: Insufficient documentation

## 2014-12-15 DIAGNOSIS — I5023 Acute on chronic systolic (congestive) heart failure: Secondary | ICD-10-CM

## 2014-12-15 DIAGNOSIS — I4510 Unspecified right bundle-branch block: Secondary | ICD-10-CM

## 2014-12-15 LAB — PROTIME-INR
INR: 2.77 — AB (ref 0.00–1.49)
PROTHROMBIN TIME: 28.8 s — AB (ref 11.6–15.2)

## 2014-12-15 LAB — BASIC METABOLIC PANEL
Anion gap: 8 (ref 5–15)
BUN: 20 mg/dL (ref 6–20)
CALCIUM: 10 mg/dL (ref 8.9–10.3)
CHLORIDE: 107 mmol/L (ref 101–111)
CO2: 27 mmol/L (ref 22–32)
CREATININE: 2.22 mg/dL — AB (ref 0.44–1.00)
GFR, EST AFRICAN AMERICAN: 25 mL/min — AB (ref >60)
GFR, EST NON AFRICAN AMERICAN: 22 mL/min — AB (ref >60)
Glucose, Bld: 87 mg/dL (ref 65–99)
Potassium: 3.7 mmol/L (ref 3.5–5.1)
SODIUM: 142 mmol/L (ref 135–145)

## 2014-12-15 LAB — T4, FREE: Free T4: 1.65 ng/dL — ABNORMAL HIGH (ref 0.61–1.12)

## 2014-12-15 LAB — TSH: TSH: 1.515 u[IU]/mL (ref 0.350–4.500)

## 2014-12-15 MED ORDER — ISOSORBIDE MONONITRATE ER 30 MG PO TB24
15.0000 mg | ORAL_TABLET | Freq: Every day | ORAL | Status: DC
  Administered 2014-12-15: 15 mg via ORAL
  Filled 2014-12-15: qty 1

## 2014-12-15 MED ORDER — HYDRALAZINE HCL 10 MG PO TABS
10.0000 mg | ORAL_TABLET | Freq: Three times a day (TID) | ORAL | Status: DC
  Administered 2014-12-15 – 2014-12-16 (×3): 10 mg via ORAL
  Filled 2014-12-15 (×3): qty 1

## 2014-12-15 MED ORDER — SODIUM CHLORIDE 0.9 % IV SOLN
INTRAVENOUS | Status: DC
  Administered 2014-12-16: 02:00:00 via INTRAVENOUS

## 2014-12-15 MED ORDER — SODIUM CHLORIDE 0.9 % IJ SOLN: 3.0000 mL | INTRAMUSCULAR | Status: DC | PRN

## 2014-12-15 MED ORDER — SODIUM CHLORIDE 0.9 % IV SOLN: 250.0000 mL | INTRAVENOUS | Status: DC | PRN

## 2014-12-15 MED ORDER — SODIUM CHLORIDE 0.9 % IJ SOLN
3.0000 mL | Freq: Two times a day (BID) | INTRAMUSCULAR | Status: DC
  Administered 2014-12-15: 3 mL via INTRAVENOUS

## 2014-12-15 NOTE — Care Management (Signed)
[facility] [place]:  This Case Manager spoke with Marvetta Gibbons, RN CM regarding patient, and it was determined patient may benefit from follow-up with the [facility] [place] after discharge. Patient has had 3 inpatient admissions in the last 6 months and does not have a PCP. She has a history of CHF, Atrial fibrillation, HTN, mitral valve repair.  Met with patient at bedside. Patient confirmed she does not have a PCP. Thoroughly discussed the follow-up and medical management provided at the [facility] at [facility] and [facility].  Patient requested more time to decide if she wants to follow-up at the [facility] after discharge. Will follow-up with patient at a later time. Marvetta Gibbons, RN CM updated.

## 2014-12-15 NOTE — Progress Notes (Signed)
[facility] PROGRESS NOTE  [name] FKC:[id number] DOB: 01-02-1946 DOA: 12/14/2014 PCP: No PCP Per Patient  Length of stay: 1   Assessment/Plan: Principal Problem:   Acute diastolic CHF (congestive heart failure) (HCC) Active Problems:   HTN (hypertension)   RBBB   S/P redo mitral valve replacement with bioprosthetic valve   Atrial fibrillation (HCC)   Hypertensive heart disease with heart failure (HCC)   S/P MVR (mitral valve replacement)   History of present illness  69 y.o. female with past medical history of MVR  , HTN, HLD, atrial fibrillation (on Coumadin), and chronic renal insufficiency, baseline creatinine around 2.2, who presented to [facility] ED on 12/14/2014 for worsening shortness of breath. Dr Rennis Golden pt and has been managed by tem this admission. Received IV diuresis with BNP 2395 and CXR with vascular congestion with bibasilar edema vs pneumonia. Repeat Echo 12/7 shows drop in EF from 55-60% to 25-30% since 07/2014. Recent LHC at that time with normal coronaries.She states she has been feeling worsening fatigue with exertion and PND over the past several weeks.   Assessment and plan. 1. Acute on Chronic Combined Systolic and Diastolic CHF Echo 12/14/14 EF 25-30%, Grade 3 DD consistent with a reversible restrictive pattern, indicative of decrease LV diastolic compliance and/or increased LA pressure. PA peak pressure 60 mm Hg. Patient is status post cardiac cath 7/17. Had severe mitral stenosis and pulmonary hypertension but no evidence of obstructive coronary artery disease. The patient presents with subacute dyspnea on exertion, elevated BNP, and pulmonary edema on chest x-ray. Note dry weight of 148 lbs, which is where the patient is at admission.  Continue Lasix to 40 mg IV every 12,Out 3.3L thus far and down 5 lbs.  Appreciate heart failure team input. Does patient need a repeat left heart cath?  2. Afib:  CHADS2-VASc 4. -Continue warfarin  and amiodarone and carvedilol  3. HTN:  -Continue carvedilol Called lisinopril 5 mg daily given chronic kidney disease, need for diuresis (been held after the patient's cardiac arrest, and not needed since) -Hydralazine only PRN  4. History of mitral valve repair:  -Repeat echocardiogram as above  5. GERD:  Stable.  -Continue home pantoprazole   DVT prophylaxsis Coumadin  Code Status:      Code Status Orders        Start     Ordered   12/14/14 0451  Full code   Continuous     12/14/14 0450      Family Communication: Discussed in detail with the patient, all imaging results, lab results explained to the patient   Disposition Plan:  Per cardiology      Consultants:  Cardiology  Procedures:  None  Antibiotics: Anti-infectives    None         HPI/Subjective: 3 L negative overnight, denies any shortness of breath this morning, blood pressure stable  Objective: Filed Vitals:   12/14/14 1315 12/14/14 1329 12/14/14 2021 12/15/14 0535  BP: 79/59 126/77 105/57 144/81  Pulse: 50 77 59 62  Temp: 97.9 F (36.6 C)  98.1 F (36.7 C) 98.2 F (36.8 C)  TempSrc: Oral  Oral Oral  Resp: 21  20 18   Height:      Weight:    64.275 kg (141 lb 11.2 oz)  SpO2: 99%  99% 100%    Intake/Output Summary (Last 24 hours) at 12/15/14 1248 Last data filed at 12/15/14 0800  Gross per 24 hour  Intake  860 ml  Output   3800 ml  Net  -2940 ml    Exam:  General: Well appearing. No resp difficulty HEENT: normal Neck: supple. JVP 6-7, does not appear markedly elevated. Carotids 2+ bilat; no bruits. No thyromegaly or nodule noted. Cor: PMI nondisplaced. RRR. Loud S2. Lungs: CTAB, normal effort.  Abdomen: soft, NT, ND, no HSM. No bruits or masses. +BS  Extremities: no cyanosis, clubbing, rash. 0-trace ankle edema Neuro: alert & orientedx3, cranial nerves grossly intact. moves all 4 extremities w/o difficulty. Affect pleasant   Data Review   Micro  Results No results found for this or any previous visit (from the past 240 hour(s)).  Radiology Reports Dg Chest 2 View  12/14/2014  CLINICAL DATA:  Initial evaluation for shortness of breath, cough for 2 weeks. EXAM: CHEST  2 VIEW COMPARISON:  Prior study from 12/13/2014. FINDINGS: Median sternotomy wires with underlying prostatic valve again noted. Cardiomegaly is stable. Mediastinal silhouette within normal limits. Diffuse pulmonary vascular congestion again noted. Small bilateral pleural effusions present, right greater than left. Patchy bibasilar opacities, which may reflect atelectasis, edema, or infection. Overall, changes are similar to prior study. No pneumothorax. Osseous structures are unchanged. Wedging of mid thoracic vertebral body noted, stable. IMPRESSION: 1. Similar appearance of the chest with cardiomegaly and diffuse vascular congestion. 2. Small bilateral pleural effusions with bibasilar opacities which may reflect edema, atelectasis, or infection. Electronically Signed   By: Rise Mu M.D.   On: 12/14/2014 02:43   Dg Chest 2 View  12/13/2014  CLINICAL DATA:  Cough shortness of breath, history of CHF and mitral valve replacement EXAM: CHEST  2 VIEW COMPARISON:  08/29/2014 FINDINGS: Prior median sternotomy and mitral valve replacement. Stable cardiomegaly. Central vascular congestion evident with increased bibasilar streaky opacities and small right pleural effusion noted. Basilar edema or pneumonia not excluded. Upper lobes remain clear. No pneumothorax. Trachea is midline. IMPRESSION: Cardiomegaly with vascular congestion and bibasilar edema versus pneumonia. Small right pleural effusion. Electronically Signed   By: Judie Petit.  Shick M.D.   On: 12/13/2014 18:24     CBC  Recent Labs Lab 12/13/14 1807 12/14/14 0124  WBC 4.5* 5.4  HGB 12.3 12.3  HCT 36.5* 39.0  PLT  --  295  MCV 77.5* 81.3  MCH 26.2* 25.6*  MCHC 33.8 31.5  RDW  --  18.1*    Chemistries   Recent  Labs Lab 12/14/14 0124 12/14/14 1040 12/15/14 0529  NA 140  --  142  K 4.9  --  3.7  CL 111  --  107  CO2 22  --  27  GLUCOSE 157*  --  87  BUN 19  --  20  CREATININE 1.81*  --  2.22*  CALCIUM 10.4*  --  10.0  AST  --  36  --   ALT  --  38  --   ALKPHOS  --  93  --   BILITOT  --  1.4*  --    ------------------------------------------------------------------------------------------------------------------ estimated creatinine clearance is 22.1 mL/min (by C-G formula based on Cr of 2.22). ------------------------------------------------------------------------------------------------------------------ No results for input(s): HGBA1C in the last 72 hours. ------------------------------------------------------------------------------------------------------------------ No results for input(s): CHOL, HDL, LDLCALC, TRIG, CHOLHDL, LDLDIRECT in the last 72 hours. ------------------------------------------------------------------------------------------------------------------ No results for input(s): TSH, T4TOTAL, T3FREE, THYROIDAB in the last 72 hours.  Invalid input(s): FREET3 ------------------------------------------------------------------------------------------------------------------ No results for input(s): VITAMINB12, FOLATE, FERRITIN, TIBC, IRON, RETICCTPCT in the last 72 hours.  Coagulation profile  Recent Labs Lab 12/14/14 0310 12/15/14 0529  INR 3.71* 2.77*    No results for input(s): DDIMER in the last 72 hours.  Cardiac Enzymes No results for input(s): CKMB, TROPONINI, MYOGLOBIN in the last 168 hours.  Invalid input(s): CK ------------------------------------------------------------------------------------------------------------------ Invalid input(s): POCBNP   CBG: No results for input(s): GLUCAP in the last 168 hours.     Studies: Dg Chest 2 View  12/14/2014  CLINICAL DATA:  Initial evaluation for shortness of breath, cough for 2 weeks. EXAM: CHEST   2 VIEW COMPARISON:  Prior study from 12/13/2014. FINDINGS: Median sternotomy wires with underlying prostatic valve again noted. Cardiomegaly is stable. Mediastinal silhouette within normal limits. Diffuse pulmonary vascular congestion again noted. Small bilateral pleural effusions present, right greater than left. Patchy bibasilar opacities, which may reflect atelectasis, edema, or infection. Overall, changes are similar to prior study. No pneumothorax. Osseous structures are unchanged. Wedging of mid thoracic vertebral body noted, stable. IMPRESSION: 1. Similar appearance of the chest with cardiomegaly and diffuse vascular congestion. 2. Small bilateral pleural effusions with bibasilar opacities which may reflect edema, atelectasis, or infection. Electronically Signed   By: Rise Mu M.D.   On: 12/14/2014 02:43   Dg Chest 2 View  12/13/2014  CLINICAL DATA:  Cough shortness of breath, history of CHF and mitral valve replacement EXAM: CHEST  2 VIEW COMPARISON:  08/29/2014 FINDINGS: Prior median sternotomy and mitral valve replacement. Stable cardiomegaly. Central vascular congestion evident with increased bibasilar streaky opacities and small right pleural effusion noted. Basilar edema or pneumonia not excluded. Upper lobes remain clear. No pneumothorax. Trachea is midline. IMPRESSION: Cardiomegaly with vascular congestion and bibasilar edema versus pneumonia. Small right pleural effusion. Electronically Signed   By: Judie Petit.  Shick M.D.   On: 12/13/2014 18:24      Lab Results  Component Value Date   HGBA1C  01/13/2007    5.8 (NOTE)   The ADA recommends the following therapeutic goals for glycemic   control related to Hgb A1C measurement:   Goal of Therapy:   < 7.0% Hgb A1C   Action Suggested:  > 8.0% Hgb A1C   Ref:  Diabetes Care, 22, Suppl. 1, 1999   Lab Results  Component Value Date   LDLCALC 99 02/17/2013   CREATININE 2.22* 12/15/2014       Scheduled Meds: . amiodarone  200 mg Oral  Daily  . aspirin EC  81 mg Oral Daily  . carvedilol  3.125 mg Oral BID WC  . furosemide  40 mg Intravenous BID  . pantoprazole  40 mg Oral Daily  . potassium chloride SA  20 mEq Oral Daily  . simvastatin  40 mg Oral Daily  . Warfarin - Pharmacist Dosing Inpatient   Does not apply q1800   Continuous Infusions:   Principal Problem:   Acute diastolic CHF (congestive heart failure) (HCC) Active Problems:   HTN (hypertension)   RBBB   S/P redo mitral valve replacement with bioprosthetic valve   Atrial fibrillation (HCC)   Hypertensive heart disease with heart failure (HCC)   S/P MVR (mitral valve replacement)    Time spent: 45 minutes   [facility]  [facility] Pager [phone]. If 7PM-7AM, please contact night-coverage at [email or web address], password [facility] 12/15/2014, 12:48 PM  LOS: 1 day

## 2014-12-15 NOTE — Progress Notes (Signed)
ANTICOAGULATION CONSULT NOTE   Pharmacy Consult for Coumadin Indication: Afib/MVR  Allergies  Allergen Reactions  . Augmentin [Amoxicillin-Pot Clavulanate] Other (See Comments)    Unknown reaction    Patient Measurements: Height: 5\' 5"  (165.1 cm) Weight: 141 lb 11.2 oz (64.275 kg) IBW/kg (Calculated) : 57  Vital Signs: Temp: 98.3 F (36.8 C) (12/08 1324) Temp Source: Oral (12/08 1324) BP: 129/74 mmHg (12/08 1410) Pulse Rate: 69 (12/08 1324)  Labs:  Recent Labs  12/13/14 1807 12/14/14 0124 12/14/14 0310 12/15/14 0529  HGB 12.3 12.3  --   --   HCT 36.5* 39.0  --   --   PLT  --  295  --   --   APTT  --   --  46*  --   LABPROT  --   --  35.9* 28.8*  INR  --   --  3.71* 2.77*  CREATININE  --  1.81*  --  2.22*    Estimated Creatinine Clearance: 22.1 mL/min (by C-G formula based on Cr of 2.22).   Medical History: Past Medical History  Diagnosis Date  . Hypertension   . Hyperlipidemia   . S/P MVR (mitral valve replacement) 04/11/1998    #29 St. Jude bileaflet mechanical valve for bacterial endocarditis  . 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 S/P redo mitral valve replacement with bioprosthetic valve 07/25/2014    27 mm [facility] Mitral bovine bioprosthetic tissue valve  . Acute CHF (congestive heart failure) (HCC) 12/14/2014    Medications:  Prescriptions prior to admission  Medication Sig Dispense Refill Last Dose  . amiodarone (PACERONE) 200 MG tablet Take 1 tablet (200 mg total) by mouth daily. 60 tablet 0 12/13/2014 at Unknown time  . aspirin EC 81 MG EC tablet Take 1 tablet (81 mg total) by mouth daily.   12/13/2014 at Unknown time  . carvedilol (COREG) 6.25 MG tablet Take 0.5 tablets (3.125 mg total) by mouth 2 (two) times daily with a meal. 180  tablet 3 12/13/2014 at 1800  . furosemide (LASIX) 40 MG tablet Take 1 tablet (40 mg total) by mouth daily. 30 tablet 5 12/13/2014 at Unknown time  . pantoprazole (PROTONIX) 40 MG tablet Take 1 tablet (40 mg total) by mouth daily. 30 tablet 0 12/13/2014 at Unknown time  . potassium chloride SA (K-DUR,KLOR-CON) 20 MEQ tablet TAKE 1 TABLET BY MOUTH ONCE DAILY 60 tablet 3 12/13/2014 at Unknown time  . saccharomyces boulardii (FLORASTOR) 250 MG capsule Take 1 capsule (250 mg total) by mouth 2 (two) times daily. 60 capsule 0 12/13/2014 at Unknown time  . simvastatin (ZOCOR) 40 MG tablet Take 1 tablet (40 mg total) by mouth daily. 30 tablet 11 12/13/2014 at Unknown time  . warfarin (COUMADIN) 2 MG tablet Take 1 tablet by mouth daily or as directed by coumadin clinic (Patient taking differently: Take 2 mg by mouth daily. ) 30 tablet 1 12/13/2014 at Unknown time   Scheduled:  . amiodarone  200 mg Oral Daily  . aspirin EC  81 mg Oral Daily  . carvedilol  3.125 mg Oral BID WC  . furosemide  40 mg Intravenous BID  . hydrALAZINE  10 mg Oral 3 times per day  . isosorbide mononitrate  15 mg Oral Daily  . pantoprazole  40 mg Oral Daily  .  potassium chloride SA  20 mEq Oral Daily  . simvastatin  40 mg Oral Daily    Assessment: 69yo female c/o worsening exertional dyspnea and productive cough, Rx'd PO ABX yesterday at [facility], CXR shows edema vs atelectasis vs infection, to continue Coumadin during admission; current INR above goal with last Coumadin dose taken 12/6 PTA.  Goal of Therapy:  INR 2-3   Plan:  Will hold Coumadin for now for planned cath tomorrow.  Tad Moore, BCPS  Clinical Pharmacist Pager [phone]  12/15/2014 2:51 PM

## 2014-12-15 NOTE — Progress Notes (Addendum)
Advanced Heart Failure Rounding Note  PCP: None Primary Cardiologist: Dr Rennis Golden Primary HF: NEW (Dr. Shirlee Latch)  Subjective:    [name] is a 69 y.o. female with past medical history of MVR (s/p St. Jude mechanical valve 2000, replacement with 67mm Edwards Magna valve in 07/2014 due to thrombosis), HTN, HLD, atrial fibrillation (on Coumadin), and chronic renal insufficiency who presented to [facility] ED on 12/14/2014 for worsening shortness of breath. Dr Rennis Golden pt and has been managed by tem this admission. Received IV diuresis with BNP 2395 and CXR with vascular congestion with bibasilar edema vs pneumonia. Repeat Echo shows drop in EF from 55-60% to 25-30% since 07/2014.  Of note, she had presented in 07/2014 with cardiogenic shock with cardiac arrest requiring emergent repeat MVR with bioprosthetic valve.  LHC at that time with normal coronaries.   Son present at bedside. She states she has been feeling worsening fatigue with exertion and PND over the past several weeks. States she would have to stop after every 2-3 aisles in the grocery store due to fatigue and to cath her breath. Had + Bendopnea during that time as well.  She denies any CP. Denies, lightheadedness, dizziness, or near syncope. Did have a cold several weeks ago that she got over.  Denies any large stressful events. Takes all medicines as directed.  Does not weigh regularly.   ECG: NSR, LPFB, RBBB  Objective:   Weight Range: 141 lb 11.2 oz (64.275 kg) Body mass index is 23.58 kg/(m^2).   Vital Signs:   Temp:  [97.9 F (36.6 C)-98.2 F (36.8 C)] 98.2 F (36.8 C) (12/08 0535) Pulse Rate:  [50-77] 62 (12/08 0535) Resp:  [18-21] 18 (12/08 0535) BP: (79-144)/(57-81) 144/81 mmHg (12/08 0535) SpO2:  [99 %-100 %] 100 % (12/08 0535) Weight:  [141 lb 11.2 oz (64.275 kg)] 141 lb 11.2 oz (64.275 kg) (12/08 0535) Last BM Date: 12/14/14  Weight change: Filed Weights   12/14/14 0443 12/15/14 0535  Weight: 146 lb 2.6  oz (66.3 kg) 141 lb 11.2 oz (64.275 kg)    Intake/Output:   Intake/Output Summary (Last 24 hours) at 12/15/14 1155 Last data filed at 12/15/14 0800  Gross per 24 hour  Intake   1100 ml  Output   3800 ml  Net  -2700 ml     Physical Exam: General:  Well appearing. No resp difficulty HEENT: normal Neck: supple. JVP 10 cm. Carotids 2+ bilat; no bruits. No thyromegaly or nodule noted. Cor: PMI nondisplaced. RRR. S3 noted. Lungs: CTAB, normal effort.  Abdomen: soft, NT, ND, no HSM. No bruits or masses. +BS  Extremities: no cyanosis, clubbing, rash. Trace ankle edema Neuro: alert & orientedx3, cranial nerves grossly intact. moves all 4 extremities w/o difficulty. Affect pleasant  Telemetry: NSR 60-70s  Labs: CBC  Recent Labs  12/13/14 1807 12/14/14 0124  WBC 4.5* 5.4  HGB 12.3 12.3  HCT 36.5* 39.0  MCV 77.5* 81.3  PLT  --  295   Basic Metabolic Panel  Recent Labs  12/14/14 0124 12/15/14 0529  NA 140 142  K 4.9 3.7  CL 111 107  CO2 22 27  GLUCOSE 157* 87  BUN 19 20  CREATININE 1.81* 2.22*  CALCIUM 10.4* 10.0   Liver Function Tests  Recent Labs  12/14/14 1040  AST 36  ALT 38  ALKPHOS 93  BILITOT 1.4*  PROT 7.6  ALBUMIN 3.3*   No results for input(s): LIPASE, AMYLASE in the last 72 hours. Cardiac Enzymes  No results for input(s): CKTOTAL, CKMB, CKMBINDEX, TROPONINI in the last 72 hours.  BNP: BNP (last 3 results)  Recent Labs  07/21/14 1436  BNP 2554.2*    ProBNP (last 3 results) No results for input(s): PROBNP in the last 8760 hours.   D-Dimer No results for input(s): DDIMER in the last 72 hours. Hemoglobin A1C No results for input(s): HGBA1C in the last 72 hours. Fasting Lipid Panel No results for input(s): CHOL, HDL, LDLCALC, TRIG, CHOLHDL, LDLDIRECT in the last 72 hours. Thyroid Function Tests No results for input(s): TSH, T4TOTAL, T3FREE, THYROIDAB in the last 72 hours.  Invalid input(s): FREET3  Other  results:  Imaging/Studies:  Dg Chest 2 View  12/14/2014  CLINICAL DATA:  Initial evaluation for shortness of breath, cough for 2 weeks. EXAM: CHEST  2 VIEW COMPARISON:  Prior study from 12/13/2014. FINDINGS: Median sternotomy wires with underlying prostatic valve again noted. Cardiomegaly is stable. Mediastinal silhouette within normal limits. Diffuse pulmonary vascular congestion again noted. Small bilateral pleural effusions present, right greater than left. Patchy bibasilar opacities, which may reflect atelectasis, edema, or infection. Overall, changes are similar to prior study. No pneumothorax. Osseous structures are unchanged. Wedging of mid thoracic vertebral body noted, stable. IMPRESSION: 1. Similar appearance of the chest with cardiomegaly and diffuse vascular congestion. 2. Small bilateral pleural effusions with bibasilar opacities which may reflect edema, atelectasis, or infection. Electronically Signed   By: Rise Mu M.D.   On: 12/14/2014 02:43   Dg Chest 2 View  12/13/2014  CLINICAL DATA:  Cough shortness of breath, history of CHF and mitral valve replacement EXAM: CHEST  2 VIEW COMPARISON:  08/29/2014 FINDINGS: Prior median sternotomy and mitral valve replacement. Stable cardiomegaly. Central vascular congestion evident with increased bibasilar streaky opacities and small right pleural effusion noted. Basilar edema or pneumonia not excluded. Upper lobes remain clear. No pneumothorax. Trachea is midline. IMPRESSION: Cardiomegaly with vascular congestion and bibasilar edema versus pneumonia. Small right pleural effusion. Electronically Signed   By: Judie Petit.  Shick M.D.   On: 12/13/2014 18:24     Medications:     Scheduled Medications: . amiodarone  200 mg Oral Daily  . aspirin EC  81 mg Oral Daily  . carvedilol  3.125 mg Oral BID WC  . furosemide  40 mg Intravenous BID  . pantoprazole  40 mg Oral Daily  . potassium chloride SA  20 mEq Oral Daily  . simvastatin  40 mg Oral  Daily  . Warfarin - Pharmacist Dosing Inpatient   Does not apply q1800     Infusions:     PRN Medications:  hydrALAZINE   Assessment/Plan  69 y.o. female w/ PMH of MVR (s/p St. Jude mechanical valve 2000, replacement with 27mm Edwards Magna valve in 07/2014 due to thrombosis), HTN, HLD, atrial fibrillation (on Coumadin), and chronic renal insufficiency who presented to [facility] ED on 12/14/2014 for worsening shortness of breath secondary to acute on chronic diastolic CHF. HF team consulted with newly decreased EF.   1. Acute on Chronic Combined Systolic and Diastolic CHF - Echo 12/14/14 EF 25-30%, Grade 3 DD consistent with a reversible restrictive pattern, indicative of decrease LV diastolic compliance and/or increased LA pressure. PA peak pressure 60 mm Hg. - She remains volume overloaded. Continue 40 mg IV lasix BID. - LHC 07/2014 which showed no evidence of obstructive CAD. - Previous baseline home weight 146. Family thinks she has deconditioned and lost weight. Does not weigh regularly. - Out 3.3L thus far and down  5 lbs.  - Will arrange for RHC in the am.  Will likely need cMRI vs LHC vs Stress test soon. - Will place on hydrlazine 10 mg TID and imdur 15 mg daily.  2. Paroxysmal Atrial Fibrillation - currently in NSR - CHA2DS2-VASc = 4 (CHF, HTN, Age, Female).  - Continue amiodarone, and BB. - INR 2.77. Hold coumadin for RHC tomorrow. 3. HLD - continue statin 4. HTN - Fluctuating. Ace held today with Cr bump.  5. S/p MVR in 07/2014 -  Stable on echo 6. AKI on Stage 3 CKD - Cr 1.81 => 2.22 overnight. - Of note, this does not seem to be far from his baseline of 1.8 -2.2.  - Follow closely with diuresis and for any procedures.  Length of Stay: 1  Graciella Freer PA-C 12/15/2014, 11:55 AM  Advanced Heart Failure Team Pager [phone] (M-F; 7a - 4p)  Please contact [facility] for night-coverage after hours (4p -7a ) and weekends on [email or web address]  Patient seen  with PA agree with the above note.   1. Acute on chronic systolic CHF: I reviewed echo this admission, EF now in the 25-30% range with RV dysfunction and moderate pulmonary hypertension. I do not see any significant regionality to the wall motion.  EF was 55% prior to 7/16 valve surgery.  Her most recent prior echo was the TEE done prior to valve replacement in 7/16.  She has had exertional dyspnea for several weeks, worse since after Thanksgiving.  Uncertain etiology and chronicity of cardiomyopathy => it is possible that this could be her post-operative state from 7/16 as she did not have a post-operative echo.  She has not had definite ACS symptoms and had no significant CAD on cardiac cath pre-valve surgery in 7/16.  Currently, she is volume overloaded on exam still but has diuresed well with IV Lasix so far.  Creatinine 1.8 => 2.2 (though creatinine in 2.2 range in the past).   - Ongoing volume overload, treat with Lasix 40 mg IV bid as she has diuresed well so far.  Follow creatinine closely given rise today.  - Hold on ACEI, will use low dose hydralazine/nitrates and titrate up as long as BP tolerates.  - Can continue low dose Coreg.  - I will arrange for RHC tomorrow morning to assess filling pressures and cardiac output.  INR 2.77 today, hold warfarin tonight and will aim for brachial access.  I discussed risks/benefits of procedure with her and she agrees to proceed.  - Would hold off on cardiac MRI as we would not be able to give contrast.  Can consider Cardiolite in the future.  Would also hold off on repeating coronary angiography given elevated creatinine, lack of regionality of wall motion abnormalities, lack of suggestive chest pain, and normal coronaries in 7/16.  2. Atrial fibrillation: Paroxysmal.  She is in NSR on amiodarone.  She is on warfarin.  3. AKI on CKD stage III: Rise in creatinine to 2.2 => got IV Lasix and lisinopril.  Suspect cardiorenal component.  Hold further lisinopril.   She is volume overloaded so will continue diuresis but will need to watch creatinine closely.  RHC to assess cardiac output.  4. S/p redo MV replacement: Mechanical MV thrombosis in 7/16, now with bioprosthetic MV.  The valve appears to be functioning properly on echo this admission.   45 minutes critical care time.   Marca Ancona 12/15/2014 1:17 PM

## 2014-12-15 NOTE — Progress Notes (Signed)
Patient Name: [name] Date of Encounter: 12/15/2014  Principal Problem:   Acute diastolic CHF (congestive heart failure) (HCC) Active Problems:   HTN (hypertension)   RBBB   S/P redo mitral valve replacement with bioprosthetic valve   Atrial fibrillation (HCC)   Hypertensive heart disease with heart failure (HCC)   S/P MVR (mitral valve replacement)    Primary Cardiologist: Dr. Rennis Golden Patient Profile: 69 y.o. female w/ PMH of MVR (s/p St. Jude mechanical valve 2000, replacement with 22mm Edwards Magna valve in 07/2014 due to thrombosis), HTN, HLD, atrial fibrillation (on Coumadin), and chronic renal insufficiency who presented to [facility] ED on 12/14/2014 for worsening shortness of breath secondary to acute on chronic diastolic CHF.  SUBJECTIVE: Reports improvement in her breathing and productive cough. Only complaint this morning is the food.  OBJECTIVE Filed Vitals:   12/14/14 1315 12/14/14 1329 12/14/14 2021 12/15/14 0535  BP: 79/59 126/77 105/57 144/81  Pulse: 50 77 59 62  Temp: 97.9 F (36.6 C)  98.1 F (36.7 C) 98.2 F (36.8 C)  TempSrc: Oral  Oral Oral  Resp: 21  20 18   Height:      Weight:    141 lb 11.2 oz (64.275 kg)  SpO2: 99%  99% 100%    Intake/Output Summary (Last 24 hours) at 12/15/14 0853 Last data filed at 12/15/14 0730  Gross per 24 hour  Intake   1000 ml  Output   3600 ml  Net  -2600 ml   Filed Weights   12/14/14 0443 12/15/14 0535  Weight: 146 lb 2.6 oz (66.3 kg) 141 lb 11.2 oz (64.275 kg)    PHYSICAL EXAM General: Well developed, well nourished, female in no acute distress. Head: Normocephalic, atraumatic.  Neck: Supple without bruits, JVD minimally elevated. Lungs:  Resp regular and unlabored, Rales at bases bilaterally, but improved from previous day Heart: RRR, S1, S2, no S3, S4, or murmur; no rub. Abdomen: Soft, non-tender, non-distended with normoactive bowel sounds. No hepatomegaly. No rebound/guarding. No obvious  abdominal masses. Extremities: No clubbing, cyanosis, or edema. Distal pedal pulses are 2+ bilaterally. Neuro: Alert and oriented X 3. Moves all extremities spontaneously. Psych: Normal affect.   LABS: CBC: Recent Labs  12/13/14 1807 12/14/14 0124  WBC 4.5* 5.4  HGB 12.3 12.3  HCT 36.5* 39.0  MCV 77.5* 81.3  PLT  --  295   INR: Recent Labs  12/15/14 0529  INR 2.77*   Basic Metabolic Panel: Recent Labs  12/14/14 0124 12/15/14 0529  NA 140 142  K 4.9 3.7  CL 111 107  CO2 22 27  GLUCOSE 157* 87  BUN 19 20  CREATININE 1.81* 2.22*  CALCIUM 10.4* 10.0   Liver Function Tests: Recent Labs  12/14/14 1040  AST 36  ALT 38  ALKPHOS 93  BILITOT 1.4*  PROT 7.6  ALBUMIN 3.3*   Cardiac Enzymes:No results for input(s): CKTOTAL, CKMB, CKMBINDEX, TROPONINI in the last 72 hours. No results for input(s): TROPIPOC in the last 72 hours. BNP:  B NATRIURETIC PEPTIDE  Date/Time Value Ref Range Status  07/21/2014 02:36 PM 2554.2* 0.0 - 100.0 pg/mL Final   TELE: NSR with rate in 60's - 70's. No atopic events.       ECHO: Study Conclusions - Left ventricle: The cavity size was normal. There was moderate concentric hypertrophy. Systolic function was severely reduced. The estimated ejection fraction was in the range of 25% to 30%. Diffuse hypokinesis. Doppler parameters are consistent with  a reversible restrictive pattern, indicative of decreased left ventricular diastolic compliance and/or increased left atrial pressure (grade 3 diastolic dysfunction). - Ventricular septum: Septal motion showed paradox. - Aortic valve: There was mild regurgitation. - Mitral valve: A bioprosthesis was present and functioning normally. - Right ventricle: The cavity size was mildly dilated. Wall thickness was normal. Systolic function was severely reduced. - Tricuspid valve: There was moderate regurgitation. - Pulmonic valve: There was moderate regurgitation. - Pulmonary  arteries: Systolic pressure was moderately to severely increased. PA peak pressure: 60 mm Hg (S).  Impressions: - When compared to prior, EF is markedly reduced.   Radiology/Studies: Dg Chest 2 View: 12/14/2014  CLINICAL DATA:  Initial evaluation for shortness of breath, cough for 2 weeks. EXAM: CHEST  2 VIEW COMPARISON:  Prior study from 12/13/2014. FINDINGS: Median sternotomy wires with underlying prostatic valve again noted. Cardiomegaly is stable. Mediastinal silhouette within normal limits. Diffuse pulmonary vascular congestion again noted. Small bilateral pleural effusions present, right greater than left. Patchy bibasilar opacities, which may reflect atelectasis, edema, or infection. Overall, changes are similar to prior study. No pneumothorax. Osseous structures are unchanged. Wedging of mid thoracic vertebral body noted, stable. IMPRESSION: 1. Similar appearance of the chest with cardiomegaly and diffuse vascular congestion. 2. Small bilateral pleural effusions with bibasilar opacities which may reflect edema, atelectasis, or infection. Electronically Signed   By: Rise Mu M.D.   On: 12/14/2014 02:43   Dg Chest 2 View: 12/13/2014  CLINICAL DATA:  Cough shortness of breath, history of CHF and mitral valve replacement EXAM: CHEST  2 VIEW COMPARISON:  08/29/2014 FINDINGS: Prior median sternotomy and mitral valve replacement. Stable cardiomegaly. Central vascular congestion evident with increased bibasilar streaky opacities and small right pleural effusion noted. Basilar edema or pneumonia not excluded. Upper lobes remain clear. No pneumothorax. Trachea is midline. IMPRESSION: Cardiomegaly with vascular congestion and bibasilar edema versus pneumonia. Small right pleural effusion. Electronically Signed   By: Judie Petit.  Shick M.D.   On: 12/13/2014 18:24    Current Medications:  . amiodarone  200 mg Oral Daily  . aspirin EC  81 mg Oral Daily  . carvedilol  3.125 mg Oral BID WC  . furosemide   40 mg Intravenous BID  . lisinopril  5 mg Oral Daily  . pantoprazole  40 mg Oral Daily  . potassium chloride SA  20 mEq Oral Daily  . simvastatin  40 mg Oral Daily  . Warfarin - Pharmacist Dosing Inpatient   Does not apply q1800      ASSESSMENT AND PLAN:  1. Acute on Chronic Combined Systolic and Diastolic CHF - echo on 12/14/2014 showed EF of 25-30% with diffuse hypokinesis (Previously 55-60% in 07/2014). Doppler parameters were consistent with a reversible restrictive pattern, indicative of decreased left ventricular diastolic compliance and/or increased left atrial pressure (grade 3 diastolic dysfunction).increased. PA peak pressure was elevated to 60 mm Hg (S). Had a cath in 07/2014 which showed no evidence of obstructive CAD. - baseline weight on home scale is 146 lbs. Was up to 151 lbs on her scales PTA. Family believes she has lost actual muscle mass so a new baseline weight will need to be established. She only weighs once every 1-2 weeks. The importance of daily weights was explained to the patient and her family. Weight 146 on admission, now 141 lbs on 12/15/2014. - Net output thus far is -3.2L. Is responding well to Lasix  IV BID. Would not increase dose at this time due to bump in  creatinine. Can hopefully convert to PO in the next 24-48 hours.  2. Paroxysmal Atrial Fibrillation - currently in NSR - This patients CHA2DS2-VASc Score and unadjusted Ischemic Stroke Rate (% per year) is equal to 4.8 % stroke rate/year from a score of 4 (CHF, HTN, Age, Female). Continue Coumadin for anticoagulation. - continue Amiodarone and BB.  3. HLD - continue statin  4. HTN - BP has been variable at 79/57 - 148/95 in the past 24 hours. - Will stop ACE-I with increased creatinine. MD to consider starting Hydralazine with variable BP fluctuations.  5. S/p MVR in 07/2014 - echo this admission shows normally functioning valve.  6. Stage 3 CKD - baseline 1.8 - 2.2 - Increased from 1.81 on  12/14/2014 to 2.22 on 12/15/2014. Continue to monitor with diuresis.   Signed, Ellsworth Lennox , PA-C 8:53 AM 12/15/2014 Pager: [phone]

## 2014-12-16 ENCOUNTER — Encounter (HOSPITAL_COMMUNITY): Payer: Self-pay | Admitting: Cardiology

## 2014-12-16 ENCOUNTER — Ambulatory Visit (HOSPITAL_COMMUNITY): Admit: 2014-12-16 | Payer: Self-pay | Admitting: Cardiology

## 2014-12-16 ENCOUNTER — Encounter (HOSPITAL_COMMUNITY)
Admission: EM | Disposition: A | Discharge status: Home / Self Care | Payer: Medicare Other | Source: Home / Self Care | Attending: Internal Medicine

## 2014-12-16 DIAGNOSIS — I5031 Acute diastolic (congestive) heart failure: Secondary | ICD-10-CM

## 2014-12-16 HISTORY — PX: CARDIAC CATHETERIZATION: SHX172

## 2014-12-16 LAB — COMPREHENSIVE METABOLIC PANEL
ALT: 27 U/L (ref 14–54)
ANION GAP: 10 (ref 5–15)
AST: 24 U/L (ref 15–41)
Albumin: 3.4 g/dL — ABNORMAL LOW (ref 3.5–5.0)
Alkaline Phosphatase: 78 U/L (ref 38–126)
BILIRUBIN TOTAL: 1.7 mg/dL — AB (ref 0.3–1.2)
BUN: 23 mg/dL — ABNORMAL HIGH (ref 6–20)
CHLORIDE: 103 mmol/L (ref 101–111)
CO2: 26 mmol/L (ref 22–32)
Calcium: 9.8 mg/dL (ref 8.9–10.3)
Creatinine, Ser: 2.19 mg/dL — ABNORMAL HIGH (ref 0.44–1.00)
GFR, EST AFRICAN AMERICAN: 26 mL/min — AB (ref >60)
GFR, EST NON AFRICAN AMERICAN: 22 mL/min — AB (ref >60)
Glucose, Bld: 85 mg/dL (ref 65–99)
POTASSIUM: 3.5 mmol/L (ref 3.5–5.1)
Sodium: 139 mmol/L (ref 135–145)
TOTAL PROTEIN: 6.9 g/dL (ref 6.5–8.1)

## 2014-12-16 LAB — CBC
HEMATOCRIT: 34.8 % — AB (ref 36.0–46.0)
HEMOGLOBIN: 11.1 g/dL — AB (ref 12.0–15.0)
MCH: 25.8 pg — ABNORMAL LOW (ref 26.0–34.0)
MCHC: 31.9 g/dL (ref 30.0–36.0)
MCV: 80.9 fL (ref 78.0–100.0)
Platelets: 245 10*3/uL (ref 150–400)
RBC: 4.3 MIL/uL (ref 3.87–5.11)
RDW: 18.2 % — ABNORMAL HIGH (ref 11.5–15.5)
WBC: 3.8 10*3/uL — ABNORMAL LOW (ref 4.0–10.5)

## 2014-12-16 LAB — POCT I-STAT 3, VENOUS BLOOD GAS (G3P V)
ACID-BASE EXCESS: 4 mmol/L — AB (ref 0.0–2.0)
Acid-Base Excess: 2 mmol/L (ref 0.0–2.0)
BICARBONATE: 25 meq/L — AB (ref 20.0–24.0)
BICARBONATE: 28.2 meq/L — AB (ref 20.0–24.0)
O2 Saturation: 59 %
O2 Saturation: 64 %
PCO2 VEN: 33.3 mmHg — AB (ref 45.0–50.0)
PH VEN: 7.47 — AB (ref 7.250–7.300)
PH VEN: 7.482 — AB (ref 7.250–7.300)
PO2 VEN: 29 mmHg — AB (ref 30.0–45.0)
TCO2: 26 mmol/L (ref 0–100)
TCO2: 29 mmol/L (ref 0–100)
pCO2, Ven: 38.8 mmHg — ABNORMAL LOW (ref 45.0–50.0)
pO2, Ven: 30 mmHg (ref 30.0–45.0)

## 2014-12-16 LAB — PROTIME-INR
INR: 2.09 — AB (ref 0.00–1.49)
PROTHROMBIN TIME: 23.4 s — AB (ref 11.6–15.2)

## 2014-12-16 SURGERY — RIGHT HEART CATH
Anesthesia: LOCAL

## 2014-12-16 MED ORDER — HYDRALAZINE HCL 25 MG PO TABS
25.0000 mg | ORAL_TABLET | Freq: Three times a day (TID) | ORAL | Status: DC
  Administered 2014-12-17 (×2): 25 mg via ORAL
  Filled 2014-12-16 (×3): qty 1

## 2014-12-16 MED ORDER — ISOSORBIDE MONONITRATE ER 30 MG PO TB24
30.0000 mg | ORAL_TABLET | Freq: Every day | ORAL | Status: DC
  Administered 2014-12-16 – 2014-12-17 (×2): 30 mg via ORAL
  Filled 2014-12-16 (×2): qty 1

## 2014-12-16 MED ORDER — SODIUM CHLORIDE 0.9 % IJ SOLN
3.0000 mL | Freq: Two times a day (BID) | INTRAMUSCULAR | Status: DC
  Administered 2014-12-16 – 2014-12-17 (×2): 3 mL via INTRAVENOUS

## 2014-12-16 MED ORDER — SPIRONOLACTONE 25 MG PO TABS
12.5000 mg | ORAL_TABLET | Freq: Every day | ORAL | Status: DC
  Administered 2014-12-16 – 2014-12-17 (×2): 12.5 mg via ORAL
  Filled 2014-12-16 (×2): qty 1

## 2014-12-16 MED ORDER — SODIUM CHLORIDE 0.9 % IJ SOLN: 3.0000 mL | INTRAMUSCULAR | Status: DC | PRN

## 2014-12-16 MED ORDER — ACETAMINOPHEN 325 MG PO TABS: 650.0000 mg | ORAL_TABLET | ORAL | Status: DC | PRN

## 2014-12-16 MED ORDER — HEPARIN (PORCINE) IN NACL 2-0.9 UNIT/ML-% IJ SOLN
INTRAMUSCULAR | Status: AC
  Filled 2014-12-16: qty 1000

## 2014-12-16 MED ORDER — SODIUM CHLORIDE 0.9 % IJ SOLN
3.0000 mL | Freq: Two times a day (BID) | INTRAMUSCULAR | Status: DC
  Administered 2014-12-16 – 2014-12-17 (×3): 3 mL via INTRAVENOUS

## 2014-12-16 MED ORDER — SODIUM CHLORIDE 0.9 % IV SOLN: 250.0000 mL | INTRAVENOUS | Status: DC | PRN

## 2014-12-16 MED ORDER — WARFARIN SODIUM 3 MG PO TABS
3.0000 mg | ORAL_TABLET | Freq: Once | ORAL | Status: AC
  Administered 2014-12-16: 3 mg via ORAL
  Filled 2014-12-16: qty 1

## 2014-12-16 MED ORDER — ONDANSETRON HCL 4 MG/2ML IJ SOLN: 4.0000 mg | Freq: Four times a day (QID) | INTRAMUSCULAR | Status: DC | PRN

## 2014-12-16 MED ORDER — MIDAZOLAM HCL 2 MG/2ML IJ SOLN
INTRAMUSCULAR | Status: DC | PRN
  Administered 2014-12-16: 1 mg via INTRAVENOUS

## 2014-12-16 MED ORDER — FUROSEMIDE 40 MG PO TABS
40.0000 mg | ORAL_TABLET | Freq: Every day | ORAL | Status: DC
  Administered 2014-12-17: 40 mg via ORAL
  Filled 2014-12-16: qty 1

## 2014-12-16 MED ORDER — LIDOCAINE HCL (PF) 1 % IJ SOLN
INTRAMUSCULAR | Status: AC
  Filled 2014-12-16: qty 30

## 2014-12-16 MED ORDER — WARFARIN - PHARMACIST DOSING INPATIENT
Freq: Every day | Status: DC
  Administered 2014-12-16: 1
  Administered 2014-12-17: 18:00:00

## 2014-12-16 SURGICAL SUPPLY — 5 items
CATH BALLN WEDGE 5F 110CM (CATHETERS) ×2 IMPLANT
KIT HEART RIGHT NAMIC (KITS) ×2 IMPLANT
SHEATH FAST CATH BRACH 5F 5CM (SHEATH) ×2 IMPLANT
TRANSDUCER W/STOPCOCK (MISCELLANEOUS) ×2 IMPLANT
TUBING CIL FLEX 10 FLL-RA (TUBING) ×2 IMPLANT

## 2014-12-16 NOTE — Progress Notes (Signed)
Advanced Heart Failure Rounding Note  PCP: None Primary Cardiologist: Dr Rennis Golden Primary HF: Dr Shirlee Latch  Subjective:    No complaints this AM. Denies SOB, Orthopnea, or CP. No lightheadedness/dizziness with walking to bathroom.   Out 1.8 L and down 1 lb on 40 mg IV lasix BID. Creatinine stable.   Objective:   Weight Range: 140 lb 9.6 oz (63.776 kg) Body mass index is 23.4 kg/(m^2).   Vital Signs:   Temp:  [97.9 F (36.6 C)-98.3 F (36.8 C)] 97.9 F (36.6 C) (12/09 0629) Pulse Rate:  [67-79] 79 (12/09 0629) Resp:  [16-18] 18 (12/09 0629) BP: (89-142)/(67-90) 142/90 mmHg (12/09 0630) SpO2:  [96 %-100 %] 100 % (12/09 0629) Weight:  [140 lb 9.6 oz (63.776 kg)] 140 lb 9.6 oz (63.776 kg) (12/09 0630) Last BM Date: 12/14/14  Weight change: Filed Weights   12/14/14 0443 12/15/14 0535 12/16/14 0630  Weight: 146 lb 2.6 oz (66.3 kg) 141 lb 11.2 oz (64.275 kg) 140 lb 9.6 oz (63.776 kg)    Intake/Output:   Intake/Output Summary (Last 24 hours) at 12/16/14 0648 Last data filed at 12/15/14 1630  Gross per 24 hour  Intake   1210 ml  Output   2250 ml  Net  -1040 ml     Physical Exam: General: Well appearing. NAD HEENT: normal Neck: supple. JVP 8-9 cm. Carotids 2+ bilat; no bruits. No thyromegaly. Cor: PMI nondisplaced. RRR. +S3. Lungs: Clear Abdomen: soft, NT, ND, no HSM. No bruits or masses. +BS  Extremities: no cyanosis, clubbing, rash. Trace LE edema Neuro: alert & orientedx3, cranial nerves grossly intact. moves all 4 extremities w/o difficulty. Affect flat  Telemetry: NSR 70s, occasional PVCs  Labs: CBC  Recent Labs  12/14/14 0124 12/16/14 0230  WBC 5.4 3.8*  HGB 12.3 11.1*  HCT 39.0 34.8*  MCV 81.3 80.9  PLT 295 245   Basic Metabolic Panel  Recent Labs  12/15/14 0529 12/16/14 0230  NA 142 139  K 3.7 3.5  CL 107 103  CO2 27 26  GLUCOSE 87 85  BUN 20 23*  CREATININE 2.22* 2.19*  CALCIUM 10.0 9.8   Liver Function Tests  Recent Labs   12/14/14 1040 12/16/14 0230  AST 36 24  ALT 38 27  ALKPHOS 93 78  BILITOT 1.4* 1.7*  PROT 7.6 6.9  ALBUMIN 3.3* 3.4*   No results for input(s): LIPASE, AMYLASE in the last 72 hours. Cardiac Enzymes No results for input(s): CKTOTAL, CKMB, CKMBINDEX, TROPONINI in the last 72 hours.  BNP: BNP (last 3 results)  Recent Labs  07/21/14 1436  BNP 2554.2*    ProBNP (last 3 results) No results for input(s): PROBNP in the last 8760 hours.   D-Dimer No results for input(s): DDIMER in the last 72 hours. Hemoglobin A1C No results for input(s): HGBA1C in the last 72 hours. Fasting Lipid Panel No results for input(s): CHOL, HDL, LDLCALC, TRIG, CHOLHDL, LDLDIRECT in the last 72 hours. Thyroid Function Tests  Recent Labs  12/15/14 1435  TSH 1.515    Other results:     Imaging/Studies:   No results found.  Latest Echo  Latest Cath   Medications:     Scheduled Medications: . amiodarone  200 mg Oral Daily  . aspirin EC  81 mg Oral Daily  . carvedilol  3.125 mg Oral BID WC  . furosemide  40 mg Intravenous BID  . hydrALAZINE  10 mg Oral 3 times per day  . isosorbide mononitrate  15 mg Oral  Daily  . pantoprazole  40 mg Oral Daily  . potassium chloride SA  20 mEq Oral Daily  . simvastatin  40 mg Oral Daily  . sodium chloride  3 mL Intravenous Q12H     Infusions: . sodium chloride 10 mL/hr at 12/16/14 0228     PRN Medications:  sodium chloride, hydrALAZINE, sodium chloride   Assessment/Plan  69 y.o. female w/ PMH of MVR (s/p St. Jude mechanical valve 2000, replacement with 59mm Edwards Magna valve in 07/2014 due to thrombosis), HTN, HLD, atrial fibrillation (on Coumadin), and chronic renal insufficiency who presented to [facility] ED on 12/14/2014 for worsening shortness of breath secondary to acute on chronic diastolic CHF. HF team consulted with newly decreased EF.   1. Acute on chronic systolic CHF: - Echo 12/7 EF 25-30% RV dysfunction and moderate  pulmonary hypertension.  -  No RWA on echo. EF was 55% prior to 7/16 valve surgery.No follow up echo s/p surgery, so unclear etiology/chronicity. - No definite ACS and no obstructive CAD on cardiac cath pre-valve surgery in 7/16.  - She remains volume overloaded. Creatinine stable. Continue IV lasix 40 mg BID. RHC today will give definitive picture of volume status.  - No ACEI with AKI/CKD. - Increase hydralazine to 25 mg TID and Imdur to 30 mg daily.  - Continue Coreg at low dose. - RHC planned for this afternoon. Pt has been told risks/benefits of procedure and she agrees to proceed.  - Eventually cardiac MRI (limited now by creatinine, would not be able to give contrast) or Cardiolite.   - Coronary angiography not great option with CKD, no WMA on echo, lack of suggestive chest pain, and normal coronaries by cath 7/16.  2. Atrial fibrillation, Paroxysmal.  - Remains in NSR on amiodarone.  - Continue warfarin.  3. AKI on CKD stage III:  - Stable today with IV diuretics - Lisinopril on hold  - Follow closely with diuresis. RHC will help establish cardiorenal component with assessment of CO 4. S/p redo MV replacement:  - s/p Mechanical MV thrombosis in 7/16, now with bioprosthetic MV.  - Stable on Echo 12/14/14  Length of Stay: 2  Graciella Freer PA-C 12/16/2014, 6:48 AM  Advanced Heart Failure Team Pager [phone] (M-F; 7a - 4p)  Please contact [facility] for night-coverage after hours (4p -7a ) and weekends on [email or web address]  Patient seen with PA, agree with the above note. She diuresed reasonably yesterday, weight is down and creatinine is stable. Still mild volume overload on exam.   - As above, plan RHC today (INR 2.09), aim for brachial access.  RHC will allow assessment of cardiac output and filling pressures today.  - Continue Lasix 40 mg IV bid, further diuretic plan will depend on RHC.  - Add spironolactone 12.5 mg daily.  - Will transition to Bidil if she  tolerates today's increase in hydralazine/nitrates.   Marca Ancona 12/16/2014 8:09 AM

## 2014-12-16 NOTE — Care Management (Signed)
[facility] [place]:  This Case Manager spoke with Marvetta Gibbons, RN CM once again regarding patient. Patient has had 3 inpatient admissions in the last 6 months and does not have a PCP. She has a history of CHF, Atrial fibrillation, HTN, mitral valve repair. Patient may benefit from post-discharge follow-up at the [facility].  Met with patient once reiterate the follow-up and medical management provided at the [facility]. Patient very sleepy during conversation and no decision made about primary care follow-up. Daughter indicated she would discuss with patient at a later time and have patient follow-up with this Case Manager. Transitional Care Case Manager will also attempt to follow-up with patient at a later time. Marvetta Gibbons, RN CM updated.

## 2014-12-16 NOTE — Progress Notes (Signed)
ANTICOAGULATION CONSULT NOTE   Pharmacy Consult for Coumadin Indication: Afib/MVR  Allergies  Allergen Reactions  . Augmentin [Amoxicillin-Pot Clavulanate] Other (See Comments)    Unknown reaction    Patient Measurements: Height: 5\' 5"  (165.1 cm) Weight: 140 lb 9.6 oz (63.776 kg) IBW/kg (Calculated) : 57  Vital Signs: Temp: 97.7 F (36.5 C) (12/09 1345) Temp Source: Oral (12/09 1345) BP: 104/76 mmHg (12/09 1345) Pulse Rate: 68 (12/09 1345)  Labs:  Recent Labs  12/13/14 1807 12/14/14 0124 12/14/14 0310 12/15/14 0529 12/16/14 0230  HGB 12.3 12.3  --   --  11.1*  HCT 36.5* 39.0  --   --  34.8*  PLT  --  295  --   --  245  APTT  --   --  46*  --   --   LABPROT  --   --  35.9* 28.8* 23.4*  INR  --   --  3.71* 2.77* 2.09*  CREATININE  --  1.81*  --  2.22* 2.19*    Estimated Creatinine Clearance: 22.4 mL/min (by C-G formula based on Cr of 2.19).   Medical History: Past Medical History  Diagnosis Date  . Hypertension   . Hyperlipidemia   . S/P MVR (mitral valve replacement) 04/11/1998    #29 St. Jude bileaflet mechanical valve for bacterial endocarditis  . 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 S/P redo mitral valve replacement with bioprosthetic valve 07/25/2014    27 mm [facility] Mitral bovine bioprosthetic tissue valve  . Acute CHF (congestive heart failure) (HCC) 12/14/2014    Medications:  Prescriptions prior to admission  Medication Sig Dispense Refill Last Dose  . amiodarone (PACERONE) 200 MG tablet Take 1 tablet (200 mg total) by mouth daily. 60 tablet 0 12/13/2014 at Unknown time  . aspirin EC 81 MG EC tablet Take 1 tablet (81 mg total) by mouth daily.   12/13/2014 at Unknown time  . carvedilol (COREG) 6.25 MG tablet Take 0.5 tablets (3.125 mg  total) by mouth 2 (two) times daily with a meal. 180 tablet 3 12/13/2014 at 1800  . furosemide (LASIX) 40 MG tablet Take 1 tablet (40 mg total) by mouth daily. 30 tablet 5 12/13/2014 at Unknown time  . pantoprazole (PROTONIX) 40 MG tablet Take 1 tablet (40 mg total) by mouth daily. 30 tablet 0 12/13/2014 at Unknown time  . potassium chloride SA (K-DUR,KLOR-CON) 20 MEQ tablet TAKE 1 TABLET BY MOUTH ONCE DAILY 60 tablet 3 12/13/2014 at Unknown time  . saccharomyces boulardii (FLORASTOR) 250 MG capsule Take 1 capsule (250 mg total) by mouth 2 (two) times daily. 60 capsule 0 12/13/2014 at Unknown time  . simvastatin (ZOCOR) 40 MG tablet Take 1 tablet (40 mg total) by mouth daily. 30 tablet 11 12/13/2014 at Unknown time  . warfarin (COUMADIN) 2 MG tablet Take 1 tablet by mouth daily or as directed by coumadin clinic (Patient taking differently: Take 2 mg by mouth daily. ) 30 tablet 1 12/13/2014 at Unknown time   Scheduled:  . amiodarone  200 mg Oral Daily  . aspirin EC  81 mg Oral Daily  . carvedilol  3.125 mg Oral BID WC  . [START ON 12/17/2014] furosemide  40 mg Oral Daily  . hydrALAZINE  25 mg Oral 3 times per day  . isosorbide  mononitrate  30 mg Oral Daily  . pantoprazole  40 mg Oral Daily  . potassium chloride SA  20 mEq Oral Daily  . simvastatin  40 mg Oral Daily  . sodium chloride  3 mL Intravenous Q12H  . sodium chloride  3 mL Intravenous Q12H  . spironolactone  12.5 mg Oral Daily  . warfarin  3 mg Oral ONCE-1800  . Warfarin - Pharmacist Dosing Inpatient   Does not apply q1800    Assessment: 69yo female c/o worsening exertional dyspnea and productive cough, Rx'd PO [facility]X yesterday at [facility], CXR shows edema vs atelectasis vs infection, to continue Coumadin during admission; current INR above goal with last Coumadin dose taken 12/6 PTA.  Dose held for cath this AM, per Dr. Shirlee Latch okay to resume now.  INR down to 2.09.  Goal of Therapy:  INR 2-3   Plan:  1. Coumadin 3 mg po x 1 tonight. 2.  Daily PT/INR.  Tad Moore, BCPS  Clinical Pharmacist Pager [phone]  12/16/2014 2:09 PM

## 2014-12-16 NOTE — H&P (View-Only) (Signed)
Advanced Heart Failure Rounding Note  PCP: None Primary Cardiologist: Dr Hilty Primary HF: Dr Soraya Paquette  Subjective:    No complaints this AM. Denies SOB, Orthopnea, or CP. No lightheadedness/dizziness with walking to bathroom.   Out 1.8 L and down 1 lb on 40 mg IV lasix BID. Creatinine stable.   Objective:   Weight Range: 140 lb 9.6 oz (63.776 kg) Body mass index is 23.4 kg/(m^2).   Vital Signs:   Temp:  [97.9 F (36.6 C)-98.3 F (36.8 C)] 97.9 F (36.6 C) (12/09 0629) Pulse Rate:  [67-79] 79 (12/09 0629) Resp:  [16-18] 18 (12/09 0629) BP: (89-142)/(67-90) 142/90 mmHg (12/09 0630) SpO2:  [96 %-100 %] 100 % (12/09 0629) Weight:  [140 lb 9.6 oz (63.776 kg)] 140 lb 9.6 oz (63.776 kg) (12/09 0630) Last BM Date: 12/14/14  Weight change: Filed Weights   12/14/14 0443 12/15/14 0535 12/16/14 0630  Weight: 146 lb 2.6 oz (66.3 kg) 141 lb 11.2 oz (64.275 kg) 140 lb 9.6 oz (63.776 kg)    Intake/Output:   Intake/Output Summary (Last 24 hours) at 12/16/14 0648 Last data filed at 12/15/14 1630  Gross per 24 hour  Intake   1210 ml  Output   2250 ml  Net  -1040 ml     Physical Exam: General: Well appearing. NAD HEENT: normal Neck: supple. JVP 8-9 cm. Carotids 2+ bilat; no bruits. No thyromegaly. Cor: PMI nondisplaced. RRR. +S3. Lungs: Clear Abdomen: soft, NT, ND, no HSM. No bruits or masses. +BS  Extremities: no cyanosis, clubbing, rash. Trace LE edema Neuro: alert & orientedx3, cranial nerves grossly intact. moves all 4 extremities w/o difficulty. Affect flat  Telemetry: NSR 70s, occasional PVCs  Labs: CBC  Recent Labs  12/14/14 0124 12/16/14 0230  WBC 5.4 3.8*  HGB 12.3 11.1*  HCT 39.0 34.8*  MCV 81.3 80.9  PLT 295 245   Basic Metabolic Panel  Recent Labs  12/15/14 0529 12/16/14 0230  NA 142 139  K 3.7 3.5  CL 107 103  CO2 27 26  GLUCOSE 87 85  BUN 20 23*  CREATININE 2.22* 2.19*  CALCIUM 10.0 9.8   Liver Function Tests  Recent Labs   12/14/14 1040 12/16/14 0230  AST 36 24  ALT 38 27  ALKPHOS 93 78  BILITOT 1.4* 1.7*  PROT 7.6 6.9  ALBUMIN 3.3* 3.4*   No results for input(s): LIPASE, AMYLASE in the last 72 hours. Cardiac Enzymes No results for input(s): CKTOTAL, CKMB, CKMBINDEX, TROPONINI in the last 72 hours.  BNP: BNP (last 3 results)  Recent Labs  07/21/14 1436  BNP 2554.2*    ProBNP (last 3 results) No results for input(s): PROBNP in the last 8760 hours.   D-Dimer No results for input(s): DDIMER in the last 72 hours. Hemoglobin A1C No results for input(s): HGBA1C in the last 72 hours. Fasting Lipid Panel No results for input(s): CHOL, HDL, LDLCALC, TRIG, CHOLHDL, LDLDIRECT in the last 72 hours. Thyroid Function Tests  Recent Labs  12/15/14 1435  TSH 1.515    Other results:     Imaging/Studies:   No results found.  Latest Echo  Latest Cath   Medications:     Scheduled Medications: . amiodarone  200 mg Oral Daily  . aspirin EC  81 mg Oral Daily  . carvedilol  3.125 mg Oral BID WC  . furosemide  40 mg Intravenous BID  . hydrALAZINE  10 mg Oral 3 times per day  . isosorbide mononitrate  15 mg Oral   Daily  . pantoprazole  40 mg Oral Daily  . potassium chloride SA  20 mEq Oral Daily  . simvastatin  40 mg Oral Daily  . sodium chloride  3 mL Intravenous Q12H     Infusions: . sodium chloride 10 mL/hr at 12/16/14 0228     PRN Medications:  sodium chloride, hydrALAZINE, sodium chloride   Assessment/Plan  69 y.o. female w/ PMH of MVR (s/p St. Jude mechanical valve 2000, replacement with 59mm Edwards Magna valve in 07/2014 due to thrombosis), HTN, HLD, atrial fibrillation (on Coumadin), and chronic renal insufficiency who presented to [facility] ED on 12/14/2014 for worsening shortness of breath secondary to acute on chronic diastolic CHF. HF team consulted with newly decreased EF.   1. Acute on chronic systolic CHF: - Echo 12/7 EF 25-30% RV dysfunction and moderate  pulmonary hypertension.  -  No RWA on echo. EF was 55% prior to 7/16 valve surgery.No follow up echo s/p surgery, so unclear etiology/chronicity. - No definite ACS and no obstructive CAD on cardiac cath pre-valve surgery in 7/16.  - She remains volume overloaded. Creatinine stable. Continue IV lasix 40 mg BID. RHC today will give definitive picture of volume status.  - No ACEI with AKI/CKD. - Increase hydralazine to 25 mg TID and Imdur to 30 mg daily.  - Continue Coreg at low dose. - RHC planned for this afternoon. Pt has been told risks/benefits of procedure and she agrees to proceed.  - Eventually cardiac MRI (limited now by creatinine, would not be able to give contrast) or Cardiolite.   - Coronary angiography not great option with CKD, no WMA on echo, lack of suggestive chest pain, and normal coronaries by cath 7/16.  2. Atrial fibrillation, Paroxysmal.  - Remains in NSR on amiodarone.  - Continue warfarin.  3. AKI on CKD stage III:  - Stable today with IV diuretics - Lisinopril on hold  - Follow closely with diuresis. RHC will help establish cardiorenal component with assessment of CO 4. S/p redo MV replacement:  - s/p Mechanical MV thrombosis in 7/16, now with bioprosthetic MV.  - Stable on Echo 12/14/14  Length of Stay: 2  Graciella Freer PA-C 12/16/2014, 6:48 AM  Advanced Heart Failure Team Pager [phone] (M-F; 7a - 4p)  Please contact [facility] for night-coverage after hours (4p -7a ) and weekends on [email or web address]  Patient seen with PA, agree with the above note. She diuresed reasonably yesterday, weight is down and creatinine is stable. Still mild volume overload on exam.   - As above, plan RHC today (INR 2.09), aim for brachial access.  RHC will allow assessment of cardiac output and filling pressures today.  - Continue Lasix 40 mg IV bid, further diuretic plan will depend on RHC.  - Add spironolactone 12.5 mg daily.  - Will transition to Bidil if she  tolerates today's increase in hydralazine/nitrates.   Marca Ancona 12/16/2014 8:09 AM

## 2014-12-16 NOTE — Interval H&P Note (Signed)
History and Physical Interval Note:  12/16/2014 12:40 PM  [name]  has presented today for surgery, with the diagnosis of chf  The various methods of treatment have been discussed with the patient and family. After consideration of risks, benefits and other options for treatment, the patient has consented to  Procedure(s): Right Heart Cath (N/A) as a surgical intervention .  The patient's history has been reviewed, patient examined, no change in status, stable for surgery.  I have reviewed the patient's chart and labs.  Questions were answered to the patient's satisfaction.     Elenna Spratling [employer]

## 2014-12-17 DIAGNOSIS — N289 Disorder of kidney and ureter, unspecified: Secondary | ICD-10-CM

## 2014-12-17 DIAGNOSIS — N189 Chronic kidney disease, unspecified: Secondary | ICD-10-CM

## 2014-12-17 LAB — BASIC METABOLIC PANEL
Anion gap: 9 (ref 5–15)
BUN: 23 mg/dL — ABNORMAL HIGH (ref 6–20)
CALCIUM: 9.5 mg/dL (ref 8.9–10.3)
CO2: 26 mmol/L (ref 22–32)
CREATININE: 2.18 mg/dL — AB (ref 0.44–1.00)
Chloride: 105 mmol/L (ref 101–111)
GFR calc non Af Amer: 22 mL/min — ABNORMAL LOW (ref >60)
GFR, EST AFRICAN AMERICAN: 26 mL/min — AB (ref >60)
Glucose, Bld: 84 mg/dL (ref 65–99)
Potassium: 3.7 mmol/L (ref 3.5–5.1)
Sodium: 140 mmol/L (ref 135–145)

## 2014-12-17 LAB — PROTIME-INR
INR: 1.68 — AB (ref 0.00–1.49)
Prothrombin Time: 19.8 seconds — ABNORMAL HIGH (ref 11.6–15.2)

## 2014-12-17 MED ORDER — WARFARIN SODIUM 3 MG PO TABS
3.0000 mg | ORAL_TABLET | Freq: Once | ORAL | Status: AC
  Administered 2014-12-17: 3 mg via ORAL
  Filled 2014-12-17: qty 1

## 2014-12-17 MED ORDER — SPIRONOLACTONE 25 MG PO TABS: 12.5000 mg | ORAL_TABLET | Freq: Every day | ORAL | Status: DC

## 2014-12-17 MED ORDER — HYDRALAZINE HCL 25 MG PO TABS: 25.0000 mg | ORAL_TABLET | Freq: Three times a day (TID) | ORAL | Status: DC

## 2014-12-17 MED ORDER — ISOSORBIDE MONONITRATE ER 30 MG PO TB24: 30.0000 mg | ORAL_TABLET | Freq: Every day | ORAL | Status: DC

## 2014-12-17 NOTE — Discharge Summary (Signed)
Physician Discharge Summary    Cardiologist: Shirlee Latch Patient ID: [name] MRN: [id number] DOB/AGE: Jul 29, 1945 69 y.o.  Admit date: 12/14/2014 Discharge date: 12/17/2014  Admission Diagnoses:  Acute diastolic congestive heart failure  Discharge Diagnoses:  Principal Problem:   Acute diastolic CHF (congestive heart failure) (HCC) Active Problems:   HTN (hypertension)   RBBB   S/P redo mitral valve replacement with bioprosthetic valve   Atrial fibrillation (HCC)   Hypertensive heart disease with heart failure (HCC)   S/P MVR (mitral valve replacement)   Acute on chronic combined systolic and diastolic heart failure (HCC)   AKI (acute kidney injury) (HCC)   Discharged Condition: stable  Hospital Course:   [name] is a 69 y.o. female with past medical history of MVR (s/p St. Jude mechanical valve 2000, replacement with 27mm Edwards Magna valve in 07/2014 due to thrombosis), HTN, HLD, atrial fibrillation (on Coumadin), and chronic renal insufficiency who presented to [facility] ED on 12/14/2014 for worsening shortness of breath.   The patient reports having a productive cough for 2-3 weeks. She denies any associated fever, chills, nausea, or vomiting. Over the past week, she has developed worsening shortness of breath. She endorses orthopnea along with dyspnea on exertion. Her family notes they have taken her shopping recently and she has to stop for frequent breaks to catch her breath, which is unusual for her. She denies any chest pain, palpitations, increased abdominal girth, or lower extremity edema.  She reports weighing herself once every week or two. Her weight on her home scales most recently was 151 lbs, and she says her weight is usually around 146 lbs. The patient's family report she has been consuming less food and they believe she looks less muscular than before. Therefore, a new dry weight may need to be established for the patient.  On the day of  admission, her BNP was found to be elevated to 2395. Her initial CXR showed vascular congestion with bibasilar edema vs. pneumonia. A small right pleural effusion was also noted. A repeat CXR on 12/14/2014 showed a similar appearance of the chest with cardiomegaly and diffuse vascular congestion. Small bilateral pleural effusions with bibasilar opacities were also noted.  Patient was admitted for IV diuresis. Check a 2-D echocardiogram which revealed ejection fraction of 25-30% which is markedly reduced from her prior echo.. There was moderate LV concentric hypertrophy. Diffuse hypokinesis. Grade 3 diastolic dysfunction. Mild aortic regurgitation. Bioprosthetic Mitral valve was functioning normally. Right ventricle mildly dilated. RV systolics function was severely reduced. There was moderate TR and moderate PR, peak PA pressure was 60 mmHg.   Her CHADSVASC score is 4.  She is on Coumadin. She ultimately diuresed 5.6 L and was switched to Lasix 40 mg daily. She was also put on carvedilol 3.125 mg twice daily Imdur 30 mg spironolactone 12.5 mg daily, hydralazine 10 mg 3 times daily.  She underwent a right heart catheterization which revealed low filling pressures and preserved cardiac output. She mains somewhat sinus rhythm on by mouth amiodarone which is one of her previous home medications.  The patient was seen by Dr. Diona Browner who felt she was stable for DC home.   She will need basic metabolic panel at office visit in CHF clinic   Consults: Cardiology  Significant Diagnostic Studies:   Echocardiogram Study Conclusions  - Left ventricle: The cavity size was normal. There was moderate concentric hypertrophy. Systolic function was severely reduced. The estimated ejection fraction was in the range  of 25% to 30%. Diffuse hypokinesis. Doppler parameters are consistent with a reversible restrictive pattern, indicative of decreased left ventricular diastolic compliance and/or increased left  atrial pressure (grade 3 diastolic dysfunction). - Ventricular septum: Septal motion showed paradox. - Aortic valve: There was mild regurgitation. - Mitral valve: A bioprosthesis was present and functioning normally. - Right ventricle: The cavity size was mildly dilated. Wall thickness was normal. Systolic function was severely reduced. - Tricuspid valve: There was moderate regurgitation. - Pulmonic valve: There was moderate regurgitation. - Pulmonary arteries: Systolic pressure was moderately to severely increased. PA peak pressure: 60 mm Hg (S).  Impressions:  - When compared to prior, EF is markedly reduced.  Right heart catheterization Conclusion    1. Low filling pressures.  2. Preserved cardiac output.   I will stop IV Lasix, transition to po tomorrow.      Right Heart Pressures Procedural Findings: Hemodynamics (mmHg) RA mean 1 RV 35/1 PA 34/12, mean 21 PCWP mean 9  Oxygen saturations: PA 61% AO 95%  Cardiac Output (Fick) 4.43  Cardiac Index (Fick) 2.59 PVR 2.7 WU     CHEST 2 VIEW  COMPARISON: Prior study from 12/13/2014.  FINDINGS: Median sternotomy wires with underlying prostatic valve again noted. Cardiomegaly is stable. Mediastinal silhouette within normal limits.  Diffuse pulmonary vascular congestion again noted. Small bilateral pleural effusions present, right greater than left. Patchy bibasilar opacities, which may reflect atelectasis, edema, or infection. Overall, changes are similar to prior study. No pneumothorax.  Osseous structures are unchanged. Wedging of mid thoracic vertebral body noted, stable.  IMPRESSION: 1. Similar appearance of the chest with cardiomegaly and diffuse vascular congestion. 2. Small bilateral pleural effusions with bibasilar opacities which may reflect edema, atelectasis, or infection. Treatments: See above  Discharge Exam: Blood pressure 106/78, pulse 81, temperature 98.3 F (36.8 C),  temperature source Oral, resp. rate 18, height 5\' 5"  (1.651 m), weight 139 lb 8 oz (63.277 kg), SpO2 98 %.   Disposition: 06-Home-Health Care Svc      Discharge Instructions    Diet - low sodium heart healthy    Complete by:  As directed      Discharge instructions    Complete by:  As directed   Monitor your weight every morning.  If you gain 3 pounds in 24 hours, or 5 pounds in a week, call the office for instructions.     Increase activity slowly    Complete by:  As directed             Medication List    STOP taking these medications        aspirin 81 MG EC tablet      TAKE these medications        amiodarone 200 MG tablet  Commonly known as:  PACERONE  Take 1 tablet (200 mg total) by mouth daily.     carvedilol 6.25 MG tablet  Commonly known as:  COREG  Take 0.5 tablets (3.125 mg total) by mouth 2 (two) times daily with a meal.     furosemide 40 MG tablet  Commonly known as:  LASIX  Take 1 tablet (40 mg total) by mouth daily.     hydrALAZINE 25 MG tablet  Commonly known as:  APRESOLINE  Take 1 tablet (25 mg total) by mouth every 8 (eight) hours.     isosorbide mononitrate 30 MG 24 hr tablet  Commonly known as:  IMDUR  Take 1 tablet (30 mg total) by mouth daily.  pantoprazole 40 MG tablet  Commonly known as:  PROTONIX  Take 1 tablet (40 mg total) by mouth daily.     potassium chloride SA 20 MEQ tablet  Commonly known as:  K-DUR,KLOR-CON  TAKE 1 TABLET BY MOUTH ONCE DAILY     saccharomyces boulardii 250 MG capsule  Commonly known as:  FLORASTOR  Take 1 capsule (250 mg total) by mouth 2 (two) times daily.     simvastatin 40 MG tablet  Commonly known as:  ZOCOR  Take 1 tablet (40 mg total) by mouth daily.     spironolactone 25 MG tablet  Commonly known as:  ALDACTONE  Take 0.5 tablets (12.5 mg total) by mouth daily.     warfarin 2 MG tablet  Commonly known as:  COUMADIN  Take 1 tablet by mouth daily or as directed by coumadin clinic        Follow-up Information    Follow up with Marca Ancona, MD On 01/04/2015.   Specialty:  Cardiology   Why:  at 1120 for HF follow up. Please bring all of your medications to your visit.  Code for pt parking is [id number].   Contact information:   [address] [place] [address] [phone]      Greater than 30 minutes was spent completing the patient's discharge.   SignedWilburt Finlay, PAC 12/17/2014, 11:57 AM

## 2014-12-17 NOTE — Progress Notes (Signed)
ANTICOAGULATION CONSULT NOTE   Pharmacy Consult for Coumadin Indication: Afib/MVR  Allergies  Allergen Reactions  . Augmentin [Amoxicillin-Pot Clavulanate] Other (See Comments)    Unknown reaction    Patient Measurements: Height: 5\' 5"  (165.1 cm) Weight: 139 lb 8 oz (63.277 kg) IBW/kg (Calculated) : 57  Vital Signs: Temp: 98.3 F (36.8 C) (12/10 0540) Temp Source: Oral (12/10 0540) BP: 106/78 mmHg (12/10 0540) Pulse Rate: 81 (12/10 0540)  Labs:  Recent Labs  12/15/14 0529 12/16/14 0230 12/17/14 0310  HGB  --  11.1*  --   HCT  --  34.8*  --   PLT  --  245  --   LABPROT 28.8* 23.4* 19.8*  INR 2.77* 2.09* 1.68*  CREATININE 2.22* 2.19* 2.18*    Estimated Creatinine Clearance: 22.5 mL/min (by C-G formula based on Cr of 2.18).   Medical History: Past Medical History  Diagnosis Date  . Hypertension   . Hyperlipidemia   . S/P MVR (mitral valve replacement) 04/11/1998    #29 St. Jude bileaflet mechanical valve for bacterial endocarditis  . 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 S/P redo mitral valve replacement with bioprosthetic valve 07/25/2014    27 mm [facility] Mitral bovine bioprosthetic tissue valve  . Acute CHF (congestive heart failure) (HCC) 12/14/2014    Medications:  Prescriptions prior to admission  Medication Sig Dispense Refill Last Dose  . amiodarone (PACERONE) 200 MG tablet Take 1 tablet (200 mg total) by mouth daily. 60 tablet 0 12/13/2014 at Unknown time  . aspirin EC 81 MG EC tablet Take 1 tablet (81 mg total) by mouth daily.   12/13/2014 at Unknown time  . carvedilol (COREG) 6.25 MG tablet Take 0.5 tablets (3.125 mg total) by mouth 2 (two) times daily with a meal. 180 tablet 3 12/13/2014 at 1800  . furosemide (LASIX) 40 MG tablet Take 1  tablet (40 mg total) by mouth daily. 30 tablet 5 12/13/2014 at Unknown time  . pantoprazole (PROTONIX) 40 MG tablet Take 1 tablet (40 mg total) by mouth daily. 30 tablet 0 12/13/2014 at Unknown time  . potassium chloride SA (K-DUR,KLOR-CON) 20 MEQ tablet TAKE 1 TABLET BY MOUTH ONCE DAILY 60 tablet 3 12/13/2014 at Unknown time  . saccharomyces boulardii (FLORASTOR) 250 MG capsule Take 1 capsule (250 mg total) by mouth 2 (two) times daily. 60 capsule 0 12/13/2014 at Unknown time  . simvastatin (ZOCOR) 40 MG tablet Take 1 tablet (40 mg total) by mouth daily. 30 tablet 11 12/13/2014 at Unknown time  . warfarin (COUMADIN) 2 MG tablet Take 1 tablet by mouth daily or as directed by coumadin clinic (Patient taking differently: Take 2 mg by mouth daily. ) 30 tablet 1 12/13/2014 at Unknown time   Scheduled:  . amiodarone  200 mg Oral Daily  . carvedilol  3.125 mg Oral BID WC  . furosemide  40 mg Oral Daily  . hydrALAZINE  25 mg Oral 3 times per day  . isosorbide mononitrate  30 mg Oral Daily  . pantoprazole  40 mg Oral Daily  . potassium chloride SA  20 mEq Oral Daily  . simvastatin  40 mg Oral Daily  . sodium chloride  3 mL Intravenous Q12H  . sodium chloride  3 mL Intravenous Q12H  . spironolactone  12.5 mg Oral  Daily  . Warfarin - Pharmacist Dosing Inpatient   Does not apply q1800    Assessment: 69yo female c/o worsening exertional dyspnea and productive cough, Rx'd PO ABX yesterday at [facility], CXR shows edema vs atelectasis vs infection, to continue Coumadin during admission for Hx St. Jude bioprosthetic MVR and afib. Held dose 12/8 for cath, resumed 12/9. INR now subtherapeutic, not unexpected with held dose 2.09>>1.68. Hgb 11.1, PLTC 245. No bleed issues documented.  Per Med rec home Coumadin dose 2 mg daily.  Per clinic note, dose is 2 mg daily except 1 mg on Mon and Fri  Goal of Therapy:  INR 2-3   Plan:  Coumadin 3 mg x 1 Daily INR, mon s/sx bleeding  Babs Bertin, PharmD, [facility] Clinical  Pharmacist Pager [phone] 12/17/2014 11:42 AM

## 2014-12-17 NOTE — Care Management Important Message (Signed)
Important Message  Patient Details  Name: [name] MRN: [id number] Date of Birth: 1945-09-13   Medicare Important Message Given:  Yes    Antony Haste, RN 12/17/2014, 9:35 AM

## 2014-12-17 NOTE — Progress Notes (Signed)
Pt. Discharged to home Pt. D/C'd via  Wheelchair with daughter Discharge information reviewed and given All personal belongings given to Pt.  Education discussed IV's was d/c intact upon removal Tele d/c  Teresa Coombs 5:17 PM

## 2014-12-17 NOTE — Progress Notes (Signed)
CHF cardiologist: Dr. Marca Ancona  Seen for followup: CHF, PAF  Subjective:    Feels better. Not short of breath at rest. No chest pain or palpitations. Stable appetite.  Objective:   Temp:  [97.6 F (36.4 C)-98.3 F (36.8 C)] 98.3 F (36.8 C) (12/10 0540) Pulse Rate:  [0-114] 81 (12/10 0540) Resp:  [11-29] 18 (12/10 0540) BP: (92-132)/(65-96) 106/78 mmHg (12/10 0540) SpO2:  [0 %-99 %] 98 % (12/10 0540) Weight:  [139 lb 8 oz (63.277 kg)] 139 lb 8 oz (63.277 kg) (12/10 0540) Last BM Date: 12/15/14  Filed Weights   12/15/14 0535 12/16/14 0630 12/17/14 0540  Weight: 141 lb 11.2 oz (64.275 kg) 140 lb 9.6 oz (63.776 kg) 139 lb 8 oz (63.277 kg)    Intake/Output Summary (Last 24 hours) at 12/17/14 1058 Last data filed at 12/17/14 0700  Gross per 24 hour  Intake    860 ml  Output   1600 ml  Net   -740 ml    Telemetry: Normal sinus rhythm.  Exam:  General: Appears comfortable at rest.  Lungs: Clear, nonlabored.  Cardiac: JVP normal, RRR with S3.  Abdomen: NABS.  Extremities: No pitting edema.  Lab Results:  Basic Metabolic Panel:  Recent Labs Lab 12/15/14 0529 12/16/14 0230 12/17/14 0310  NA 142 139 140  K 3.7 3.5 3.7  CL 107 103 105  CO2 GLUCOSE 87 85 84  BUN 20 23* 23*  CREATININE 2.22* 2.19* 2.18*  CALCIUM 10.0 9.8 9.5    Liver Function Tests:  Recent Labs Lab 12/14/14 1040 12/16/14 0230  AST 36 24  ALT 38 27  ALKPHOS 93 78  BILITOT 1.4* 1.7*  PROT 7.6 6.9  ALBUMIN 3.3* 3.4*    CBC:  Recent Labs Lab 12/13/14 1807 12/14/14 0124 12/16/14 0230  WBC 4.5* 5.4 3.8*  HGB 12.3 12.3 11.1*  HCT 36.5* 39.0 34.8*  MCV 77.5* 81.3 80.9  PLT  --  295 245    Coagulation:  Recent Labs Lab 12/15/14 0529 12/16/14 0230 12/17/14 0310  INR 2.77* 2.09* 1.68*    Medications:   Scheduled Medications: . amiodarone  200 mg Oral Daily  . aspirin EC  81 mg Oral Daily  . carvedilol  3.125 mg Oral BID WC  . furosemide  40 mg  Oral Daily  . hydrALAZINE  25 mg Oral 3 times per day  . isosorbide mononitrate  30 mg Oral Daily  . pantoprazole  40 mg Oral Daily  . potassium chloride SA  20 mEq Oral Daily  . simvastatin  40 mg Oral Daily  . sodium chloride  3 mL Intravenous Q12H  . sodium chloride  3 mL Intravenous Q12H  . spironolactone  12.5 mg Oral Daily  . Warfarin - Pharmacist Dosing Inpatient   Does not apply q1800     PRN Medications:  sodium chloride, sodium chloride, acetaminophen, hydrALAZINE, ondansetron (ZOFRAN) IV, sodium chloride, sodium chloride   Assessment:   1. Acute on chronic systolic heart failure with LVEF 25-30%, nonischemic cardiomyopathy suspected based on previous workup. Right heart catheterization yesterday showed low filling pressures with preserved cardiac output. Lasix has been transitioned to oral.  2. Paroxysmal atrial fibrillation, maintaining sinus rhythm on amiodarone. Also on Coumadin.  3. Acute on chronic renal insufficiency with CKD stage 3 at baseline. Lisinopril has been on hold. Creatinine 2.1.  4. Status post redo mitral valve replacement due to thrombosis in July, now with bioprosthetic MVR. Stable function  by recent echocardiogram.  5. Minimal coronary luminal irregularities by cardiac catheterization in July 2016.   Plan/Discussion:    I communicated with Dr. Shirlee Latch, patient is ready for discharge today. She should already have an appointment for follow-up in the CHF clinic. Continue amiodarone 200 mg daily, Coumadin, Aldactone 12.5 mg daily, KCl 20 mEq daily, Lasix 40 mg daily, hydralazine 25 mg 3 times a day, Imdur 30 mg daily, and Coreg 3.125 mg twice daily. Do not resume ACE inhibitor. She will need follow-up BMET for her visit with CHF clinic.   Jonelle Sidle, M.D., F.A.C.C.

## 2014-12-19 ENCOUNTER — Ambulatory Visit (INDEPENDENT_AMBULATORY_CARE_PROVIDER_SITE_OTHER): Payer: Medicare Other | Admitting: Pharmacist Clinician (PhC)/ Clinical Pharmacy Specialist

## 2014-12-19 ENCOUNTER — Telehealth: Payer: Self-pay

## 2014-12-19 DIAGNOSIS — Z954 Presence of other heart-valve replacement: Secondary | ICD-10-CM

## 2014-12-19 DIAGNOSIS — Z953 Presence of xenogenic heart valve: Secondary | ICD-10-CM

## 2014-12-19 DIAGNOSIS — Z7901 Long term (current) use of anticoagulants: Secondary | ICD-10-CM

## 2014-12-19 DIAGNOSIS — Z952 Presence of prosthetic heart valve: Secondary | ICD-10-CM

## 2014-12-19 LAB — POCT INR: INR: 2.3

## 2014-12-19 MED FILL — Heparin Sodium (Porcine) 2 Unit/ML in Sodium Chloride 0.9%: INTRAMUSCULAR | Qty: 500 | Status: AC

## 2014-12-19 MED FILL — Lidocaine HCl Local Preservative Free (PF) Inj 1%: INTRAMUSCULAR | Qty: 30 | Status: AC

## 2014-12-19 NOTE — Telephone Encounter (Signed)
At the request of Peterson Lombard, RN CM, this CM attempted to contact the patient to discuss plans for medical follow up after her hospitalization and further explain the [facility]itional [facility] services. Call placed to # [phone] (M) and a HIPAA compliant voice mail message was left requesting a call back to # [phone] or [phone].

## 2014-12-20 ENCOUNTER — Telehealth: Payer: Self-pay

## 2014-12-20 ENCOUNTER — Other Ambulatory Visit: Payer: Self-pay | Admitting: Physical Medicine & Rehabilitation

## 2014-12-20 NOTE — Telephone Encounter (Signed)
Attempted to contact the patient again to discuss plans for medical follow up post hospitalization and the Transitional Care Program.  Call placed to # [phone] (M) and a HIPAA compliant voice mail message was left requesting a call back to # [phone] or [phone].

## 2014-12-22 ENCOUNTER — Other Ambulatory Visit: Payer: Self-pay | Admitting: Internal Medicine

## 2014-12-22 MED ORDER — PANTOPRAZOLE SODIUM 40 MG PO TBEC: 40.0000 mg | DELAYED_RELEASE_TABLET | Freq: Every day | ORAL | Status: DC

## 2014-12-23 ENCOUNTER — Ambulatory Visit: Payer: Self-pay | Admitting: Internal Medicine

## 2014-12-26 ENCOUNTER — Telehealth: Payer: Self-pay

## 2014-12-26 NOTE — Telephone Encounter (Signed)
Attempted to contact the patient to check on her status and to discuss follow up medical care post hospitalization. Call placed to # [phone] (M) and a HIPAA compliant voice mail message was left requesting a call back to # [phone] or # [phone].

## 2015-01-04 ENCOUNTER — Encounter (HOSPITAL_COMMUNITY): Payer: Self-pay

## 2015-01-04 ENCOUNTER — Encounter: Payer: Self-pay | Admitting: *Deleted

## 2015-01-04 ENCOUNTER — Ambulatory Visit (HOSPITAL_COMMUNITY)
Admit: 2015-01-04 | Discharge: 2015-01-04 | Disposition: A | Discharge status: Home / Self Care | Payer: Medicare Other | Source: Ambulatory Visit | Attending: Cardiology | Admitting: Cardiology

## 2015-01-04 VITALS — BP 114/62 | HR 83 | Wt 141.5 lb

## 2015-01-04 DIAGNOSIS — Z953 Presence of xenogenic heart valve: Secondary | ICD-10-CM

## 2015-01-04 DIAGNOSIS — I5022 Chronic systolic (congestive) heart failure: Secondary | ICD-10-CM | POA: Diagnosis not present

## 2015-01-04 DIAGNOSIS — N183 Chronic kidney disease, stage 3 unspecified: Secondary | ICD-10-CM | POA: Insufficient documentation

## 2015-01-04 DIAGNOSIS — I5032 Chronic diastolic (congestive) heart failure: Secondary | ICD-10-CM

## 2015-01-04 DIAGNOSIS — I480 Paroxysmal atrial fibrillation: Secondary | ICD-10-CM | POA: Insufficient documentation

## 2015-01-04 DIAGNOSIS — Z954 Presence of other heart-valve replacement: Secondary | ICD-10-CM | POA: Diagnosis not present

## 2015-01-04 MED ORDER — CARVEDILOL 6.25 MG PO TABS: 6.2500 mg | ORAL_TABLET | Freq: Two times a day (BID) | ORAL | Status: DC

## 2015-01-04 MED ORDER — SPIRONOLACTONE 25 MG PO TABS: 25.0000 mg | ORAL_TABLET | Freq: Every day | ORAL | Status: DC

## 2015-01-04 NOTE — Progress Notes (Signed)
Patient ID: [name], female   DOB: [birth year], 69 y.o.   MRN: [id number]  Cardiology: Dr. Rennis Golden HF Cardiology: Dr. Shirlee Latch  69 yo with past medical history of MVR (s/p St. Jude mechanical valve 2000 in setting of endocarditis, replacement with 27mm Edwards Magna valve in 07/2014 due to mechanical valve thrombosis), HTN, paroxysmal atrial fibrillation (on Coumadin), and CKD who presented to [facility] ED on 12/14/2014 for worsening shortness of breath. Echo showed drop in EF from 55-60% in 7/16 to 25-30% in 12/16.  Of note, she had presented in 07/2014 with cardiogenic shock with cardiac arrest requiring emergent repeat MVR with bioprosthetic valve. LHC at that time showed normal coronaries.   During the 12/16 admission, she was diuresed with IV Lasix and felt considerably better.  RHC after diuresis showed normal right and left heart filling pressures and preserved cardiac output.  She is now home, lives alone.  She is no longer noticing exertional dyspnea.  She is able to climb steps without problems.  No orthopnea/PND.  No chest pain.  No lightheadedness.  No palpitations.  She appears to be in NSR today.   ECG (12/16): NSR, RBBB, LPFB  Labs (12/16): K 3.7, creatinine 2.18, LFTs normal, TSH normal  PMH: 1. Bacterial endocarditis with St Jude mechanical MVR in 2000.  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   2. Atrial fibrillation: Paroxysmal. She is on amiodarone.  3. HTN 4. Hyperlipidemia 5. CKD 6. Chronic systolic CHF:  Suspect nonischemic cardiomyopathy.  Echo (12/16) with EF 25-30%, moderate LVH, diffuse hypokinesis, normally functioning bioprosthetic mitral valve, PASP 60 mmHg, mildly dilated RV with severely decreased systolic function.  Echo prior to MV surgery in 7/16 showed normal EF.  LHC in 7/16 with normal coronary arteries.  RHC (12/16) with mean RA 1, PA 34/12 mean 21, mean PCWP 9, CI 2.59, PVR 2.7 WU.   Social History   Social  History  . Marital Status: Divorced    Spouse Name: N/A  . Number of Children: 3  . Years of Education: 14   Occupational History  .       Social History Main Topics  . Smoking status: Former Smoker -- 0.12 packs/day    Types: Cigarettes    Quit date: 01/17/1973  . Smokeless tobacco: Never Used  . Alcohol Use: No  . Drug Use: No  . Sexual Activity: Not Currently   Other Topics Concern  . Not on file   Social History Narrative   Family History  Problem Relation Age of Onset  . Lung cancer Mother   . Heart failure Maternal Grandmother   . Heart failure Maternal Grandfather    ROS: All systems reviewed and negative except as per HPI.   Current Outpatient Prescriptions  Medication Sig Dispense Refill  . amiodarone (PACERONE) 200 MG tablet Take 1 tablet (200 mg total) by mouth daily. 60 tablet 0  . carvedilol (COREG) 6.25 MG tablet Take 1 tablet (6.25 mg total) by mouth 2 (two) times daily with a meal. 180 tablet 3  . furosemide (LASIX) 40 MG tablet Take 1 tablet (40 mg total) by mouth daily. 30 tablet 5  . hydrALAZINE (APRESOLINE) 25 MG tablet Take 1 tablet (25 mg total) by mouth every 8 (eight) hours. 90 tablet 11  . isosorbide mononitrate (IMDUR) 30 MG 24 hr tablet Take 1 tablet (30 mg total) by mouth daily. 30 tablet 11  . pantoprazole (PROTONIX) 40 MG tablet Take 1  tablet (40 mg total) by mouth daily. 30 tablet 3  . potassium chloride SA (K-DUR,KLOR-CON) 20 MEQ tablet TAKE 1 TABLET BY MOUTH ONCE DAILY 60 tablet 3  . saccharomyces boulardii (FLORASTOR) 250 MG capsule Take 1 capsule (250 mg total) by mouth 2 (two) times daily. 60 capsule 0  . simvastatin (ZOCOR) 40 MG tablet Take 1 tablet (40 mg total) by mouth daily. 30 tablet 11  . spironolactone (ALDACTONE) 25 MG tablet Take 1 tablet (25 mg total) by mouth daily. 90 tablet 3  . warfarin (COUMADIN) 2 MG tablet Take 1 tablet by mouth daily or as directed by coumadin clinic (Patient taking differently: Take 2 mg by mouth  daily. ) 30 tablet 1   No current facility-administered medications for this encounter.   BP 114/62 mmHg  Pulse 83  Wt 141 lb 8 oz (64.184 kg)  SpO2 98% General: NAD Neck: No JVD, no thyromegaly or thyroid nodule.  Lungs: Clear to auscultation bilaterally with normal respiratory effort. CV: Nondisplaced PMI.  Heart regular S1/S2, no S3/S4, 1/6 SEM RUSB.  No peripheral edema.  No carotid bruit.  Normal pedal pulses.  Abdomen: Soft, nontender, no hepatosplenomegaly, no distention.  Skin: Intact without lesions or rashes.  Neurologic: Alert and oriented x 3.  Psych: Normal affect. Extremities: No clubbing or cyanosis.  HEENT: Normal.   Assessment/Plan: 1. Chronic systolic CHF: Echo in 12/16 showed EF in 25-30% range with RV dysfunction and moderate pulmonary hypertension. No significant regionality to the wall motion. EF was 55% prior to 7/16 valve surgery. Her most recent prior echo was the TEE done prior to valve replacement in 7/16. She had exertional dyspnea for several weeks prior to 12/16 admission. Uncertain etiology and chronicity of cardiomyopathy => it is possible that this could be her post-operative state from 7/16 as she did not have a post-operative echo. She has not had definite ACS symptoms and had no significant CAD on cardiac cath pre-valve surgery in 7/16. RHC after diuresis in 12/16 showed preserved cardiac output and normal filling pressures.  Since hospitalization, she has been doing well with NYHA class II symptoms.  She is no longer significantly volume overloaded on exam.  - Continue Lasix 40 mg daily.  BMET/BNP today.  - She can increase Coreg today to 6.25 mg bid and spironolactone to 25 mg daily.  Will need BMET in 2 wks, follow K/creatinine closely with spironolactone increase. - Hold off on ACEI/ARB now with elevated creatinine.  Continue current hydralazine/nitrates.   - As above, suspect nonischemic cardiomyopathy given normal cath prior to 7/16 valve  surgery and lack of regionality on echo or chest pain, but cannot fully rule out CAD. Would hold off on repeating coronary angiography given elevated creatinine and lack of suggestive symptoms.  However, think it would be reasonable to obtain ETT-Cardiolite to assess for ischemia/infarction.  - Will titrate up HF meds, repeat echo in 3 months or so to look for improvement. She would be ICD candidate (RBBB so not CRT) if EF remains < 35% over time.  2. Atrial fibrillation: Paroxysmal. She is in NSR on amiodarone. She is on warfarin. Recent LFTs/TSH normal.  Will need regular eye exams with amiodarone use.  3. CKD stage III: BMET today, most recent creatinine 2.18.  4. S/p redo MV replacement: Mechanical MV thrombosis in 7/16, now with bioprosthetic MV. The valve appeared to be functioning properly on 12/16 echo.   Marca Ancona 01/04/2015

## 2015-01-04 NOTE — Progress Notes (Signed)
Per Dr. Shirlee Latch, attempted to obtain venipuncture labs from patient.  Patient jumped and began squirming around in chair constantly to where advancing needle to obtain labs was impossible.  Patient refused to allow advancement of butterfly needle and labs for today's apt had to be cancelled for this reason.  Ave Filter

## 2015-01-04 NOTE — Patient Instructions (Signed)
INCREASE Carvedilol (Coreg) to 6.25 mg (1 tab) twice daily.  INCREASE Spironolactone to 25 mg (1 tab) once daily.  Routine lab work today. Will notify you of abnormal results, otherwise no news is good news!  Return in 1-2 weeks to repeat lab work.  Follow up 1 month.  Do the following things EVERYDAY: 1) Weigh yourself in the morning before breakfast. Write it down and keep it in a log. 2) Take your medicines as prescribed 3) Eat low salt foods-Limit salt (sodium) to 2000 mg per day.  4) Stay as active as you can everyday 5) Limit all fluids for the day to less than 2 liters

## 2015-01-05 ENCOUNTER — Telehealth (HOSPITAL_COMMUNITY): Payer: Self-pay | Admitting: *Deleted

## 2015-01-05 ENCOUNTER — Other Ambulatory Visit (HOSPITAL_COMMUNITY): Payer: Self-pay | Admitting: Cardiology

## 2015-01-05 DIAGNOSIS — I5022 Chronic systolic (congestive) heart failure: Secondary | ICD-10-CM

## 2015-01-05 NOTE — Telephone Encounter (Signed)
Patient given detailed instructions per Myocardial Perfusion Study Information Sheet for the test on 01/09/14 at 0730. Patient notified to arrive 15 minutes early and that it is imperative to arrive on time for appointment to keep from having the test rescheduled.  If you need to cancel or reschedule your appointment, please call the office within 24 hours of your appointment. Failure to do so may result in a cancellation of your appointment, and a $50 no show fee. Patient verbalized understanding.Nico Rogness, Adelene Idler

## 2015-01-05 NOTE — Telephone Encounter (Signed)
Spoke to pt gave her appt time and date for treadmill test

## 2015-01-05 NOTE — Telephone Encounter (Signed)
Please schedule for cardiolite treadmill per Dr.McLean *see note below*  " had wanted this patient to have treadmill Cardiolite done over at [facility] st office to assess for ischemia. Had talked to her about it but do not think I asked you to schedule it. Could to talk to her and then arrange the Cardiolite in the next couple of weeks? Thanks.

## 2015-01-06 ENCOUNTER — Telehealth: Payer: Self-pay | Admitting: Internal Medicine

## 2015-01-06 MED ORDER — WARFARIN SODIUM 2 MG PO TABS: ORAL_TABLET | ORAL | Status: DC

## 2015-01-06 NOTE — Telephone Encounter (Signed)
°*  STAT* If patient is at the pharmacy, call can be transferred to refill team.   1. Which medications need to be refilled? (please list name of each medication and dose if known) Warfarin-please call today,will take her last pill tonight  2. Which pharmacy/location (including street and city if local pharmacy) is medication to be sent to?[facility] [phone]  3. Do they need a 30 day or 90 day supply? 30 and refills

## 2015-01-07 ENCOUNTER — Other Ambulatory Visit: Payer: Self-pay | Admitting: Physician Assistant

## 2015-01-07 ENCOUNTER — Telehealth: Payer: Self-pay | Admitting: Physician Assistant

## 2015-01-07 MED ORDER — WARFARIN SODIUM 2 MG PO TABS: ORAL_TABLET | ORAL | Status: DC

## 2015-01-07 NOTE — Telephone Encounter (Signed)
    Ran out of coumadin. I have called in a 7 day Rx to a [facility] hour [facility] with the holiday and a normal Rx to her normal pharmacy [facility] outpatient pharmacy)  Cline Crock PA-C  [facility]

## 2015-01-10 ENCOUNTER — Ambulatory Visit (HOSPITAL_COMMUNITY): Payer: Medicare Other

## 2015-01-11 ENCOUNTER — Telehealth (HOSPITAL_COMMUNITY): Payer: Self-pay | Admitting: *Deleted

## 2015-01-11 NOTE — Telephone Encounter (Signed)
Patient given detailed instructions per Myocardial Perfusion Study Information Sheet for the test on 01/12/14 at 1100 Patient notified to arrive 15 minutes early and that it is imperative to arrive on time for appointment to keep from having the test rescheduled.  If you need to cancel or reschedule your appointment, please call the office within 24 hours of your appointment. Failure to do so may result in a cancellation of your appointment, and a $50 no show fee. Patient verbalized understanding.Antionette Char, RN

## 2015-01-13 ENCOUNTER — Encounter (HOSPITAL_COMMUNITY): Payer: Medicare Other

## 2015-01-18 ENCOUNTER — Ambulatory Visit (INDEPENDENT_AMBULATORY_CARE_PROVIDER_SITE_OTHER): Payer: Medicare Other | Admitting: Internal Medicine

## 2015-01-18 ENCOUNTER — Ambulatory Visit (INDEPENDENT_AMBULATORY_CARE_PROVIDER_SITE_OTHER): Payer: Medicare Other | Admitting: Pharmacist Clinician (PhC)/ Clinical Pharmacy Specialist

## 2015-01-18 VITALS — BP 94/72 | HR 54 | Ht 64.0 in | Wt 136.5 lb

## 2015-01-18 DIAGNOSIS — I480 Paroxysmal atrial fibrillation: Secondary | ICD-10-CM

## 2015-01-18 DIAGNOSIS — I5043 Acute on chronic combined systolic (congestive) and diastolic (congestive) heart failure: Secondary | ICD-10-CM | POA: Diagnosis not present

## 2015-01-18 DIAGNOSIS — Z954 Presence of other heart-valve replacement: Secondary | ICD-10-CM

## 2015-01-18 DIAGNOSIS — Z953 Presence of xenogenic heart valve: Secondary | ICD-10-CM

## 2015-01-18 DIAGNOSIS — Z79899 Other long term (current) drug therapy: Secondary | ICD-10-CM

## 2015-01-18 DIAGNOSIS — R0602 Shortness of breath: Secondary | ICD-10-CM

## 2015-01-18 DIAGNOSIS — Z7901 Long term (current) use of anticoagulants: Secondary | ICD-10-CM | POA: Diagnosis not present

## 2015-01-18 DIAGNOSIS — I110 Hypertensive heart disease with heart failure: Secondary | ICD-10-CM

## 2015-01-18 DIAGNOSIS — Z952 Presence of prosthetic heart valve: Secondary | ICD-10-CM

## 2015-01-18 LAB — BASIC METABOLIC PANEL
BUN: 39 mg/dL — ABNORMAL HIGH (ref 7–25)
CALCIUM: 10.8 mg/dL — AB (ref 8.6–10.4)
CO2: 28 mmol/L (ref 20–31)
Chloride: 103 mmol/L (ref 98–110)
Creat: 2.67 mg/dL — ABNORMAL HIGH (ref 0.50–0.99)
Glucose, Bld: 92 mg/dL (ref 65–99)
POTASSIUM: 4.5 mmol/L (ref 3.5–5.3)
SODIUM: 138 mmol/L (ref 135–146)

## 2015-01-18 LAB — POCT INR: INR: 5.6

## 2015-01-18 NOTE — Patient Instructions (Addendum)
RESCHEDULE THE NUCLEAR STRESS TEST ORDERED BY DR. [facility]  Your physician recommends that you return for lab work in: TODAY AT [facility].  YOU DO NOT NEED TO GET THE BLOOD WORK DONE ON Friday.  THIS IS BEING DONE TODAY.  KEEP FOLLOW-UP APPOINTMENT AT THE HEART FAILURE CLINIC 02/06/15 10:40AM

## 2015-01-19 ENCOUNTER — Encounter: Payer: Self-pay | Admitting: Internal Medicine

## 2015-01-19 LAB — BRAIN NATRIURETIC PEPTIDE: BRAIN NATRIURETIC PEPTIDE: 227 pg/mL — AB (ref 0.0–100.0)

## 2015-01-19 NOTE — Progress Notes (Signed)
OFFICE NOTE  Chief Complaint:  Hospital follow-up  Primary Care Physician: No PCP Per Patient  HPI:  [name] is a pleasant 82 her old female who unfortunately had a spontaneous valvular endocarditis after a pneumonia in 2000. She ultimately had severe mitral regurgitation and destruction of the mitral valve necessitating emergent replacement with a 29 mm mechanical St. Jude valve by Dr. Cornelius Moras. Since that time she's done very well. She continues to exercise and did not get any shortness of breath or chest pain. She also has dyslipidemia and hypertension which is well managed. She is chronically on warfarin therapy which is followed in this office.  Her last echocardiogram to evaluate valvular gradients was in 2011 which showed an EF of  greater than 55%. The gradients across the valve were 9 and 4.9 mm Hg.  I saw [name] back in the office today. She is doing extremely well. She denies any shortness of breath or worsening chest pain. She continues to exercise. Her weight is appropriate. Carina was mildly elevated today and is followed by [name]. Her mitral valve apparently is working well. Her last echocardiogram in 2015 showed stable gradients and normal LV function. She is due for cholesterol reassessment. Of note her blood pressure is elevated today. She says that she's been out of her medicines for a little while and that there were no refills that were authorized, however our system indicates that they are available.  [name] returns today for follow-up from her recent hospitalization. She was admitted for congestive heart failure. An echocardiogram showed an EF of 25-30%. This represents an acute decline from her preoperative EF which was normal this past summer. Unfortunately the summer she developed valve thrombosis and had to have a redo mitral valve surgery. She was doing fairly well for that for while and then developed worsening shortness of breath and was  found to have cardiomyopathy. It's unclear whether her EF drop was perioperative course at some later time. She was diuresed and treated by the advanced heart failure service. She had a right heart catheterization and since her hospitalization has been doing better. She does not appear to have had recurrent atrial fibrillation and is been on amiodarone therapy as well as warfarin anticoagulation. She says she has had a productive cough for the last several weeks after an upper respiratory infection but it is improving. Blood pressure today is noted to be very low at 94/72. Her weight is actually 5 pounds lower than her discharge weight.  PMHx:  Past Medical History  Diagnosis Date  . Hypertension   . Hyperlipidemia   . S/P MVR (mitral valve replacement) 04/11/1998    #29 St. Jude bileaflet mechanical valve for bacterial endocarditis  . 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 S/P redo mitral valve replacement with bioprosthetic valve 07/25/2014    27 mm [facility] Mitral bovine bioprosthetic tissue valve  . Acute CHF (congestive heart failure) (HCC) 12/14/2014    Past Surgical History  Procedure Laterality Date  . Mitral valve replacement  04/11/1998    St. Jude mechanical MVR (severe MR, episode of streptococcal bacterial endocarditis - Dr. Cornelius Moras  . Tubal ligation    . Transthoracic echocardiogram  11/2009    EF=>55%; bi-leaflet St. Jude mech prosthesis; mild TR, RVSP  elevated at 40-69mmHg, mod pulm HTN; trace AV regurg; mild pulm valve regurg  . Total abdominal hysterectomy w/ bilateral salpingoophorectomy  12/14/1998  . Cardiac catheterization N/A 07/24/2014    Procedure: Right/Left Heart Cath and Coronary Angiography;  Surgeon: Iran Ouch, MD;  Location: [facility] INVASIVE CV LAB;   Service: Cardiovascular;  Laterality: N/A;  . Cardiac catheterization N/A 07/22/2014    Procedure: Fluoroscopy Guidance;  Surgeon: Lennette Bihari, MD;  Location: [facility] INVASIVE CV LAB;  Service: Cardiovascular;  Laterality: N/A;  . Mitral valve replacement N/A 07/25/2014    Procedure: REDO MITRAL VALVE REPLACEMENT (MVR);  Surgeon: Purcell Nails, MD;  Location: [facility] OR;  Service: Open Heart Surgery;  Laterality: N/A;  . Cardiac valve replacement    . Cardiac catheterization N/A 12/16/2014    Procedure: Right Heart Cath;  Surgeon: Laurey Morale, MD;  Location: [facility] INVASIVE CV LAB;  Service: Cardiovascular;  Laterality: N/A;    FAMHx:  Family History  Problem Relation Age of Onset  . Lung cancer Mother   . Heart failure Maternal Grandmother   . Heart failure Maternal Grandfather     SOCHx:   reports that she quit smoking about 42 years ago. Her smoking use included Cigarettes. She smoked 0.12 packs per day. She has never used smokeless tobacco. She reports that she does not drink alcohol or use illicit drugs.  ALLERGIES:  Allergies  Allergen Reactions  . Augmentin [Amoxicillin-Pot Clavulanate] Other (See Comments)    Unknown reaction    ROS: A comprehensive review of systems was negative except for: Respiratory: positive for cough  HOME MEDS: Current Outpatient Prescriptions  Medication Sig Dispense Refill  . amiodarone (PACERONE) 200 MG tablet Take 1 tablet (200 mg total) by mouth daily. 60 tablet 0  . carvedilol (COREG) 6.25 MG tablet Take 1 tablet (6.25 mg total) by mouth 2 (two) times daily with a meal. 180 tablet 3  . furosemide (LASIX) 40 MG tablet Take 1 tablet (40 mg total) by mouth daily. 30 tablet 5  . hydrALAZINE (APRESOLINE) 25 MG tablet Take 1 tablet (25 mg total) by mouth every 8 (eight) hours. 90 tablet 11  . isosorbide mononitrate (IMDUR) 30 MG 24 hr tablet Take 1 tablet (30 mg total) by mouth daily. 30 tablet 11  . pantoprazole (PROTONIX) 40 MG tablet Take 1 tablet (40  mg total) by mouth daily. 30 tablet 3  . potassium chloride SA (K-DUR,KLOR-CON) 20 MEQ tablet TAKE 1 TABLET BY MOUTH ONCE DAILY 60 tablet 3  . saccharomyces boulardii (FLORASTOR) 250 MG capsule Take 1 capsule (250 mg total) by mouth 2 (two) times daily. 60 capsule 0  . simvastatin (ZOCOR) 40 MG tablet Take 1 tablet (40 mg total) by mouth daily. 30 tablet 11  . spironolactone (ALDACTONE) 25 MG tablet Take 1 tablet (25 mg total) by mouth daily. 90 tablet 3  . warfarin (COUMADIN) 2 MG tablet Take 1 tablet by mouth daily or as directed by coumadin clinic 30 tablet 6   No current facility-administered medications for this visit.    LABS/IMAGING: Results for orders placed or performed in visit on 01/18/15 (from the past 48 hour(s))  Basic metabolic panel     Status: Abnormal   Collection Time: 01/18/15 10:19 AM  Result Value Ref Range   Sodium 138 135 - 146 mmol/L   Potassium 4.5 3.5 - 5.3 mmol/L   Chloride 103 98 - 110 mmol/L   CO2 28 20 - 31 mmol/L   Glucose, Bld  92 65 - 99 mg/dL   BUN 39 (H) 7 - 25 mg/dL   Creat 1.61 (H) 0.96 - 0.99 mg/dL   Calcium 04.5 (H) 8.6 - 10.4 mg/dL  Brain natriuretic peptide     Status: Abnormal   Collection Time: 01/18/15 10:19 AM  Result Value Ref Range   Brain Natriuretic Peptide 227.0 (H) 0.0 - 100.0 pg/mL   No results found.  VITALS: BP 94/72 mmHg  Pulse 54  Ht 5\' 4"  (1.626 m)  Wt 136 lb 8 oz (61.916 kg)  BMI 23.42 kg/m2  EXAM: General appearance: alert and no distress Neck: no carotid bruit and no JVD Lungs: clear to auscultation bilaterally Heart: regular rate and rhythm, S1, S2 normal, there is a mitral click Abdomen: soft, non-tender; bowel sounds normal; no masses,  no organomegaly Extremities: extremities normal, atraumatic, no cyanosis or edema Pulses: 2+ and symmetric Skin: Skin color, texture, turgor normal. No rashes or lesions Neurologic: Grossly normal Psych: Mood, affect normal  EKG: Sinus bradycardia with PACs at  54  ASSESSMENT: 1. History of 29 mm St. Jude mechanical mitral valve replacement (2000) for SBE with valve thrombosis and subsequent re-do MVR with a 27 mm Edwards Magna-Ease bioprosthetic valve 2. Acute systolic congestive heart failure-EF 25-30% 3. HTN 4. Dyslipidemia 5. Paroxysmal atrial fibrillation on amiodarone 6. Chronic anticoagulation on warfarin 7. Right bundle branch block 8. Question memory loss/confusion  PLAN: 1.   [name] seems to be doing well with regards to volume status after discharge. She is hypotensive today although not symptomatic. I discussed with Dr. Shirlee Latch and he does not want to decrease carvedilol although he recently increased it as well as her Aldactone. She was supposed to have a repeat metabolic profile which we'll go ahead and check today as well as a BNP. Also she was scheduled for nuclear stress test which was canceled due to the snow. We'll go ahead and reschedule that for her. She scheduled a follow-up with him in the heart failure clinic later this month. I like to see her back in about 6 months hopefully she'll be optimized from a heart failure standpoint at that time.  Chrystie Nose, MD, [facility] Attending Cardiologist [facility]  Chrystie Nose 01/19/2015, 11:55 AM

## 2015-01-20 ENCOUNTER — Telehealth: Payer: Self-pay | Admitting: *Deleted

## 2015-01-20 ENCOUNTER — Other Ambulatory Visit (HOSPITAL_COMMUNITY): Payer: Self-pay

## 2015-01-20 ENCOUNTER — Telehealth: Payer: Self-pay | Admitting: Pharmacist Clinician (PhC)/ Clinical Pharmacy Specialist

## 2015-01-20 ENCOUNTER — Telehealth: Payer: Self-pay | Admitting: Internal Medicine

## 2015-01-20 ENCOUNTER — Other Ambulatory Visit: Payer: Self-pay | Admitting: Thoracic Surgery (Cardiothoracic Vascular Surgery)

## 2015-01-20 DIAGNOSIS — N289 Disorder of kidney and ureter, unspecified: Secondary | ICD-10-CM

## 2015-01-20 NOTE — Telephone Encounter (Signed)
Pt's med and labwork instructions re-communicated. Pt aware of coumadin hold instructions and lasix alternating dose instructions.

## 2015-01-20 NOTE — Telephone Encounter (Signed)
-----   Message from Chrystie Nose, MD sent at 01/19/2015 12:04 PM EST ----- BNP is much lower - reflective of volume status on exam. Creatinine is higher at 2.67. She is not on any nephrotoxins or an ACE/ARB. Potassium is ok. Defer to Dr. Shirlee Latch.  Dr. Rennis Golden

## 2015-01-20 NOTE — Telephone Encounter (Signed)
[name] is calling because her mother had an appointment on yesterday and Mrs. [name] says that there have been some changes to medication .Marland Kitchen Please call   Thanks

## 2015-01-20 NOTE — Telephone Encounter (Signed)
Patient called LM stating she needed to clarify dosing on Lasix  Returned call, patient getting lasix and warfarin mixed up when telling me her dosing.    Called her daughter and clarified lasix and warfarin doses.  In the future will email warfarin results to tiffany.

## 2015-01-20 NOTE — Telephone Encounter (Signed)
Notes Recorded by Laurey Morale, MD on 01/19/2015 at 12:43 PM Decrease Lasix to 40 daily alternating with 20 mg daily. BMET in 2 wks.  Spoke with pt, she voiced understanding of medication change. Lab orders mailed to the pt.

## 2015-01-25 ENCOUNTER — Other Ambulatory Visit: Payer: Self-pay | Admitting: Thoracic Surgery (Cardiothoracic Vascular Surgery)

## 2015-01-25 ENCOUNTER — Telehealth: Payer: Self-pay | Admitting: Internal Medicine

## 2015-01-25 MED ORDER — AMIODARONE HCL 200 MG PO TABS: 200.0000 mg | ORAL_TABLET | Freq: Every day | ORAL | Status: DC

## 2015-01-25 NOTE — Telephone Encounter (Signed)
Amiodarone has been refilled

## 2015-01-25 NOTE — Telephone Encounter (Signed)
[name] is calling about her mother medication Amiodarone 200mg  HCL  Wants to know if she is supposed to continue to take this , if so please send the refill to [facility] for a 90 day supply .Marland Kitchen Please call . Can leave a Detailed message on her cell phone about this and if you can tell her how her mother needs to take her Warfrian . 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   Thanks

## 2015-01-25 NOTE — Telephone Encounter (Signed)
Left detailed message about how to take warfarin and next check date

## 2015-01-27 ENCOUNTER — Inpatient Hospital Stay (HOSPITAL_COMMUNITY): Admission: RE | Admit: 2015-01-27 | Payer: Self-pay | Source: Ambulatory Visit

## 2015-01-30 ENCOUNTER — Other Ambulatory Visit: Payer: Self-pay

## 2015-01-30 DIAGNOSIS — I5031 Acute diastolic (congestive) heart failure: Secondary | ICD-10-CM

## 2015-01-30 NOTE — Patient Outreach (Signed)
[facility]) Care Management  01/30/2015  [name] 09-24-1945 [id number]  Referral Date:  01/25/15 Referral Source:  NextGen tier 4 list as of 12/22/2014. Referral Issue:  2 admissions and 2 home health claims.  H/o [facility] care.  Please inquire to see if patient visits her PCP on a regular basis and when was patients last visit to PCP.   Providers: Primary MD:   NONE per patient.   Patient states she is seeing Dr. Rennis Golden and considers him her Primary MD. HH: none  Insurance: Medicare and AARP  Social: Patient lives in her home.  Patient states h/o previously working at [employer] for 27 years. Mobility: Ambulates with not assistive devices.  Falls: none  Pain: none  Transportation: yes / owns a car.  Caregiver: Daughter, [name]  DME: cane, scales   THN conditions:  CHF Admissions: 3 ER visits: 0 12/14/2014 - 12/17/2014  Acute diastolic congestive heart failure  Medications:  Taking more than 10 medications  Co-pay cost issues:  No  Flu Vaccine:  No   Consent: Patient agreed to [facility] services.   Plan:  Referral 01/25/15 Screening:  01/30/15 Telephonic RN CM start date:  01/30/15 Introductory package mailed 01/30/2015    [facility] Referral 01/30/15 -taking 10 medications   PCP needed: RN CM provided education on difference in Primary and Specialist care.   Advised patient to confirm with Dr. Rennis Golden that he has agreed to provide Primary care services on next MD visit.   Advised that this would be unusual practice and that most Specialist want their patients followed by a Primary MD in addition to Specialty services.  RN CM used example of vaccines, illness care and yearly physicals.   Advised that Primary MD co-pay cost is also less than Specialist care and would benefit patient with decreasing her cost of care.   Advised in importance to have primary MD following patient to make sure patient receiving  holistic care and all needs are identified.   Patient will consult Dr. Rennis Golden for confirmation on next MD appt..   RN CM advised to notify RN CM if patient requires additional assistance finding a Primary MD.  RN CM will continue to follow and provide CM intervention as needed.  (Per [facility] MR:  Dr. Asencion Partridge. [facility]).   RN CM notified [facility] Care Management Assistant: agreed to services/case opened.  RN CM will schedule for next contact call within 30 days.  RN CM advised to please notify MD of any changes in condition prior to scheduled appt's.   RN CM provided contact name and # [phone] or main office # [phone] and 24-hour nurse line # [phone].  RN CM confirmed patient is aware of 911 services for urgent emergency needs.  Donato Schultz, RN, BSN, [facility], [facility], [facility], CCM  [facility] Coordinator [phone] Direct [phone] Cell [phone] Office [phone] Fax

## 2015-02-01 ENCOUNTER — Ambulatory Visit (INDEPENDENT_AMBULATORY_CARE_PROVIDER_SITE_OTHER): Payer: Medicare Other | Admitting: Pharmacist Clinician (PhC)/ Clinical Pharmacy Specialist

## 2015-02-01 DIAGNOSIS — Z953 Presence of xenogenic heart valve: Secondary | ICD-10-CM

## 2015-02-01 DIAGNOSIS — Z7901 Long term (current) use of anticoagulants: Secondary | ICD-10-CM

## 2015-02-01 LAB — POCT INR: INR: 2.1

## 2015-02-06 ENCOUNTER — Other Ambulatory Visit: Payer: Self-pay

## 2015-02-06 ENCOUNTER — Encounter (HOSPITAL_COMMUNITY): Payer: Self-pay

## 2015-02-06 ENCOUNTER — Ambulatory Visit (HOSPITAL_COMMUNITY)
Admission: RE | Admit: 2015-02-06 | Discharge: 2015-02-06 | Disposition: A | Discharge status: Home / Self Care | Payer: Medicare Other | Source: Ambulatory Visit | Attending: Cardiology | Admitting: Cardiology

## 2015-02-06 VITALS — BP 120/60 | HR 56 | Wt 136.5 lb

## 2015-02-06 DIAGNOSIS — Z952 Presence of prosthetic heart valve: Secondary | ICD-10-CM | POA: Diagnosis not present

## 2015-02-06 DIAGNOSIS — Z87891 Personal history of nicotine dependence: Secondary | ICD-10-CM | POA: Diagnosis not present

## 2015-02-06 DIAGNOSIS — I272 Other secondary pulmonary hypertension: Secondary | ICD-10-CM | POA: Insufficient documentation

## 2015-02-06 DIAGNOSIS — Z79899 Other long term (current) drug therapy: Secondary | ICD-10-CM | POA: Insufficient documentation

## 2015-02-06 DIAGNOSIS — I130 Hypertensive heart and chronic kidney disease with heart failure and stage 1 through stage 4 chronic kidney disease, or unspecified chronic kidney disease: Secondary | ICD-10-CM | POA: Diagnosis not present

## 2015-02-06 DIAGNOSIS — I4510 Unspecified right bundle-branch block: Secondary | ICD-10-CM | POA: Diagnosis not present

## 2015-02-06 DIAGNOSIS — Z7901 Long term (current) use of anticoagulants: Secondary | ICD-10-CM | POA: Diagnosis not present

## 2015-02-06 DIAGNOSIS — E785 Hyperlipidemia, unspecified: Secondary | ICD-10-CM | POA: Diagnosis not present

## 2015-02-06 DIAGNOSIS — N183 Chronic kidney disease, stage 3 unspecified: Secondary | ICD-10-CM

## 2015-02-06 DIAGNOSIS — I5022 Chronic systolic (congestive) heart failure: Secondary | ICD-10-CM | POA: Insufficient documentation

## 2015-02-06 DIAGNOSIS — Z953 Presence of xenogenic heart valve: Secondary | ICD-10-CM

## 2015-02-06 DIAGNOSIS — I480 Paroxysmal atrial fibrillation: Secondary | ICD-10-CM | POA: Insufficient documentation

## 2015-02-06 DIAGNOSIS — I4891 Unspecified atrial fibrillation: Secondary | ICD-10-CM | POA: Diagnosis not present

## 2015-02-06 LAB — BASIC METABOLIC PANEL
Anion gap: 11 (ref 5–15)
BUN: 28 mg/dL — AB (ref 6–20)
CHLORIDE: 104 mmol/L (ref 101–111)
CO2: 25 mmol/L (ref 22–32)
Calcium: 11 mg/dL — ABNORMAL HIGH (ref 8.9–10.3)
Creatinine, Ser: 3.06 mg/dL — ABNORMAL HIGH (ref 0.44–1.00)
GFR calc Af Amer: 17 mL/min — ABNORMAL LOW (ref >60)
GFR calc non Af Amer: 15 mL/min — ABNORMAL LOW (ref >60)
GLUCOSE: 100 mg/dL — AB (ref 65–99)
POTASSIUM: 5.1 mmol/L (ref 3.5–5.1)
Sodium: 140 mmol/L (ref 135–145)

## 2015-02-06 MED ORDER — AMIODARONE HCL 200 MG PO TABS: 100.0000 mg | ORAL_TABLET | Freq: Every day | ORAL | Status: DC

## 2015-02-06 MED ORDER — FUROSEMIDE 20 MG PO TABS: 20.0000 mg | ORAL_TABLET | Freq: Every day | ORAL | Status: DC

## 2015-02-06 MED ORDER — CARVEDILOL 3.125 MG PO TABS: 3.1250 mg | ORAL_TABLET | Freq: Two times a day (BID) | ORAL | Status: DC

## 2015-02-06 NOTE — Patient Outreach (Signed)
[facility]) Care Management  02/06/2015  [name] 25-May-1945 [id number]   Outreach call #1 to patient for Initial Assessment.  Patient not reached [phone] home/cell.  RN CM left HIPAA compliant voice message with name and number.  RN CM scheduled for next contact call within two weeks.   Donato Schultz, RN, BSN, [facility], CCM  [facility] Coordinator [phone] Direct [phone] Cell [phone] Office [phone] Fax

## 2015-02-06 NOTE — Progress Notes (Signed)
Patient ID: [name], female   DOB: February 04, 1945, 70 y.o.   MRN: [id number]  Cardiology: Dr. Rennis Golden HF Cardiology: Dr. Shirlee Latch  70 yo with past medical history of MVR (s/p St. Jude mechanical valve 2000 in setting of endocarditis, replacement with 27mm Edwards Magna valve in 07/2014 due to mechanical valve thrombosis), HTN, paroxysmal atrial fibrillation (on Coumadin), and CKD who presented to [facility] ED on 12/14/2014 for worsening shortness of breath. Echo showed drop in EF from 55-60% in 7/16 to 25-30% in 12/16.  Of note, she had presented in 07/2014 with cardiogenic shock with cardiac arrest requiring emergent repeat MVR with bioprosthetic valve. LHC at that time showed normal coronaries.   During 12/16 admission, she was diuresed with IV Lasix and felt considerably better.  RHC after diuresis showed normal right and left heart filling pressures and preserved cardiac output.     She returns HF follow up. Last visit lasix and spiro increased. On 1/11 renal function was up and she was instructed to alternate lasix 40 mg daily then 20 mg daily. She continued to take lasix 40 mg daily. Denies SOB/PND/Orthopnea/CP.  Weight at home 131-134 pounds. She has not had medications today. Occasionally misses doses of hydralazine.  Lives alone. Daughter prepares medications and placed in a pill box.   ECG (12/16): NSR, RBBB, LPFB ECG (01/2015): Sinus Brady 68 bpm, RBBB  Labs (12/16): K 3.7, creatinine 2.18, LFTs normal, TSH normal] Labs (01/18/2015): K 4.5, Creatinine 2.67, BNP 227   PMH: 1. Bacterial endocarditis with St Jude mechanical MVR in 2000.  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   2. Atrial fibrillation: Paroxysmal. She is on amiodarone.  3. HTN 4. Hyperlipidemia 5. CKD 6. Chronic systolic CHF:  Suspect nonischemic cardiomyopathy.  Echo (12/16) with EF 25-30%, moderate LVH, diffuse hypokinesis, normally functioning bioprosthetic mitral valve,  PASP 60 mmHg, mildly dilated RV with severely decreased systolic function.  Echo prior to MV surgery in 7/16 showed normal EF.  LHC in 7/16 with normal coronary arteries.  RHC (12/16) with mean RA 1, PA 34/12 mean 21, mean PCWP 9, CI 2.59, PVR 2.7 WU.   Social History   Social History  . Marital Status: Divorced    Spouse Name: N/A  . Number of Children: 3  . Years of Education: 14   Occupational History  .       Social History Main Topics  . Smoking status: Former Smoker -- 0.12 packs/day    Types: Cigarettes    Quit date: 01/17/1973  . Smokeless tobacco: Never Used  . Alcohol Use: No  . Drug Use: No  . Sexual Activity: Not Currently   Other Topics Concern  . Not on file   Social History Narrative   Family History  Problem Relation Age of Onset  . Lung cancer Mother   . Heart failure Maternal Grandmother   . Heart failure Maternal Grandfather    ROS: All systems reviewed and negative except as per HPI.   Current Outpatient Prescriptions  Medication Sig Dispense Refill  . amiodarone (PACERONE) 200 MG tablet Take 1 tablet (200 mg total) by mouth daily. 90 tablet 2  . carvedilol (COREG) 6.25 MG tablet Take 1 tablet (6.25 mg total) by mouth 2 (two) times daily with a meal. 180 tablet 3  . furosemide (LASIX) 40 MG tablet Take 1 tablet (40 mg total) by mouth daily. 30 tablet 5  . hydrALAZINE (APRESOLINE) 25 MG tablet Take 1  tablet (25 mg total) by mouth every 8 (eight) hours. 90 tablet 11  . isosorbide mononitrate (IMDUR) 30 MG 24 hr tablet Take 1 tablet (30 mg total) by mouth daily. 30 tablet 11  . pantoprazole (PROTONIX) 40 MG tablet Take 1 tablet (40 mg total) by mouth daily. 30 tablet 3  . potassium chloride SA (K-DUR,KLOR-CON) 20 MEQ tablet TAKE 1 TABLET BY MOUTH ONCE DAILY 60 tablet 3  . saccharomyces boulardii (FLORASTOR) 250 MG capsule Take 1 capsule (250 mg total) by mouth 2 (two) times daily. 60 capsule 0  . simvastatin (ZOCOR) 40 MG tablet Take 1 tablet (40 mg  total) by mouth daily. 30 tablet 11  . spironolactone (ALDACTONE) 25 MG tablet Take 1 tablet (25 mg total) by mouth daily. 90 tablet 3  . warfarin (COUMADIN) 2 MG tablet Take 1 tablet by mouth daily or as directed by coumadin clinic 30 tablet 6   No current facility-administered medications for this encounter.   BP 120/60 mmHg  Pulse 56  Wt 136 lb 8 oz (61.916 kg)  SpO2 100% General: NAD Neck: No JVD, no thyromegaly or thyroid nodule.  Lungs: Clear to auscultation bilaterally with normal respiratory effort. CV: Nondisplaced PMI.  Heart bradycardic, regular S1/S2, no S3/S4, 1/6 SEM RUSB.  No peripheral edema.  No carotid bruit.  Normal pedal pulses.  Abdomen: Soft, nontender, no hepatosplenomegaly, no distention.  Skin: Intact without lesions or rashes.  Neurologic: Alert and oriented x 3.  Psych: Normal affect. Extremities: No clubbing or cyanosis.  HEENT: Normal.   Assessment/Plan: 1. Chronic systolic CHF: Echo in 12/16 showed EF in 25-30% range with RV dysfunction and moderate pulmonary hypertension. No significant regionality to the wall motion. EF was 55% prior to 7/16 valve surgery. Her most recent prior echo was the TEE done prior to valve replacement in 7/16. She had exertional dyspnea for several weeks prior to 12/16 admission. Uncertain etiology and chronicity of cardiomyopathy => it is possible that this could be her post-operative state from 7/16 as she did not have a post-operative echo. She has not had definite ACS symptoms and had no significant CAD on cardiac cath pre-valve surgery in 7/16. RHC after diuresis in 12/16 showed preserved cardiac output and normal filling pressures.  Since hospitalization, she has been doing well with NYHA class II symptoms.  She is not volume overloaded on exam. Recent kidney function was elevated on January 11th and she was instructed to cut back lasix however she continued lasix 40 mg daily.  - Today we will cut back lasix to 20 mg daily.  Continue 25 mg spiro daily.  Check BMET today.  - Cut back carvedilol to 3.125 mg twice a day with marked sinus bradycardia (she is asymptomatic).   - Hold off on ACEI/ARB now with elevated creatinine.  Continue current hydralazine/nitrates.   - As above, suspect nonischemic cardiomyopathy given normal cath prior to 7/16 valve surgery and lack of regionality on echo or chest pain, but cannot fully rule out CAD. Would hold off on repeating coronary angiography given elevated creatinine and lack of suggestive symptoms.  However, think it would be reasonable to obtain ETT-Cardiolite to assess for ischemia/infarction. Re-schedule as she missed her last stress test appointment (thought she was having a stress test today).   - Repeat echo in 3 months or so to look for improvement. She would be ICD candidate (RBBB so not CRT) if EF remains < 35% over time.  2. Atrial fibrillation: Paroxysmal. She is in  NSR on amiodarone. She is on warfarin. Recent LFTs/TSH normal.  Will need regular eye exams with amiodarone use. Cut back amio to 100 mg daily given bradycardia.  3. CKD stage III: Most recent creatinine 2.67. Check BMET today and will cut back on Lasix.  4. S/p redo MV replacement: Mechanical MV thrombosis in 7/16, now with bioprosthetic MV. The valve appeared to be functioning properly on 12/16 echo.   Follow in 4 weeks. Offered paramedicine however she declines.   Amy Clegg NP-C  02/06/2015   Patient seen with NP, agree with the above note.  She seems to have some memory problems.  Says that her daughter prepares pillbox but some concerns about med compliance (did not cut back on Lasix).    On exam, she is not volume overloaded.  With increased creatinine, I will have her cut back Lasix to 20 mg daily. BMET today and again in 10 days.   She is markedly bradycardic though denies lightheadedness.  I will have her decrease Coreg to 3.125 mg bid and amiodarone to 100 mg daily.   Repeat echo in 2-3  months.   Will reschedule Cardiolite.   Followup in 1 month.   Marca Ancona 02/06/2015

## 2015-02-06 NOTE — Patient Instructions (Signed)
Decrease Carvedilol to 3.125 mg Twice daily, we have sent you in a new prescription for this dose  Decrease Amiodarone to 100 mg (1/2 tab) daily  Decrease Furosemide to 20 mg daily, we have sent you in a new prescription for this dose  Make sure to take your Hydralazine 3 times a day  Labs today  Your physician recommends that you schedule a follow-up appointment in: 1 month

## 2015-02-07 ENCOUNTER — Telehealth (HOSPITAL_COMMUNITY): Payer: Self-pay

## 2015-02-07 ENCOUNTER — Other Ambulatory Visit: Payer: Self-pay

## 2015-02-07 ENCOUNTER — Other Ambulatory Visit: Payer: Self-pay | Admitting: Cardiology

## 2015-02-07 DIAGNOSIS — I5022 Chronic systolic (congestive) heart failure: Secondary | ICD-10-CM

## 2015-02-07 NOTE — Telephone Encounter (Signed)
Reviewed medications changes with [name] who sets up pills:  She understands to stop the K+ and Spironolactone Hold Lasix for 3 days Start back lasix 20 mg on 4th day Reduce amiodorone to 1/2 of a 200 mg tablet a day

## 2015-02-07 NOTE — Telephone Encounter (Signed)
Patient called Is unsure of instructions on what medications to stop and what to hold Difficulty with understanding what to do. Her daughter sets up all her medications Asked her to have daughter call me so I could review instructions with her Also asked her to bring in her medications when she comes in for lab draw on Monday

## 2015-02-07 NOTE — Patient Outreach (Signed)
[facility]) Care Management  02/07/2015  Kaydance Grable 04/20/1945 [id number]   Outreach call to daughter, [name] [phone]. Contact not reached.  RN CM left HIPAA compliant voice message with name and number for call back.  RN CM will schedule for next outreach call within 1 week.    Donato Schultz, RN, BSN, [facility], CCM  [facility] [employer] Management Coordinator [phone] Direct [phone] Cell [phone] Office [phone] Fax

## 2015-02-07 NOTE — Telephone Encounter (Signed)
Updated patients medication list.

## 2015-02-07 NOTE — Patient Outreach (Addendum)
[facility]) Care Management  02/07/2015  [name] [birth year] [id number]   Referral Date: 01/25/15 Referral Source: NextGen tier 4 list as of 12/22/2014. Referral Issue: 2 admissions and 2 home health claims. H/o [facility].  Please inquire to see if patient visits her PCP on a regular basis and when was patients last visit to PCP.   Providers: Primary MD: NONE per patient. (Per [facility] MR: Dr. Asencion Rodriguez. [name] [facility]).  Patient states she is seeing Dr. Rennis Rodriguez and considers him her Primary MD.  Patient will consult Dr. Rennis Rodriguez to confirm if Dr. Rennis Rodriguez agrees to provide PCP services.  RN CM discussed with patient on 01/30/15 and advised PCP needed.  Patient refused RN CM assistance in finding PCP and stated she would discuss with Dr. Rennis Rodriguez. (See 01/30/2015 RN CM note for further details).   Dr. Marca Rodriguez:  Last appt 02/07/2015 Dr. Barry Rodriguez:  appt scheduled on 08/24/15. HH: none  Insurance: Medicare and AARP  Social:   Patient noted to have memory issues.  Patient unable to provide address without much time and locating an envelope with her address.  Patient verbalized she does not understand why she has to provide her address.  Patient difficult to engage and does not understand why RN CM is asking so many questions.  States she does not keep up with any of these things that RN CM is asking about and RN CM will need to call her doctor for this information.  Patient states "if you want answers to your questions; you will need to call the HF Clinic at  [phone]."  Patient is divorced and lives in her home. Patient states h/o previously working at [employer] for 27 years. Mobility: Ambulates with not assistive devices.  Falls: none  Pain: none  Safety:  None reported, however, patient noted to lack ability to understand questions or provide answers.   Patient noted to have memory issues.   Transportation:  yes / owns a car.  Caregiver: Daughter, [name] lives across the street Resources:  Patient offered paramedicine but declined on 02/06/2015 MD appt.  DME: cane, scales, contacts  THN conditions: Chronic systolic CHF with CKD stage III Admissions: 2  12/14/2014 - 12/17/2014 Acute diastolic congestive heart failure ER visits: 0 H/o EF 24-30% 12/2014.  States nothing has changed with her condition.  States plans for Stress test and lab work over the next couple of months.  States last known weight of 131 and not weighing or checking BP at home.  H/o HF Clinic weight 136 on 02/06/15 visit with BP of 120/60.      Medications:  Taking more than 10 medications - H/o [facility] Referral 01/30/15 Co-pay cost issues: No  Flu Vaccine: patient unable to answer.  No history noted on [facility].   Patient states she had medication changes on her MD visit to the HF Clinic on 02/06/15 but is not able to provide this detail. H/o Medication box prepared by daughter. H/o per Epic 02/06/15 note patient did not cut back on Lasix as ordered on prior visit.    Consent: Patient states she feels she has been provided adequate CHF education by her MD and does not see any need for [facility] services.    Plan:  Referral 01/25/15 Screening: 01/30/15 Telephonic RN CM start date: 01/30/15  Introductory package mailed 01/30/2015   H/o 01-18-2015 Patient authorizes all chmg practices to release information to daughter,  [name], [phone]. RN CM will attempt outreach call to Daughter to assess needs prior to closing case.  Per Epic MR review; H/o daughter manages medications.  H/o HF Clinic communicated Plan of Care and orders to daughter on 02/06/2015 due to patients unable to understand instructions on MD appt 02/06/2015 -RN CM will follow-up with daughter regarding patients current memory issues and who is assessing and monitoring this issue.   -RN CM will also discuss safety concerns.  -RN CM will notify [facility]  pharmacist referral sent 01/30/2015; outcome pending.  -RN CM will schedule next contact with daughter, [name] within the next 30 days. RN CM will review the following for more clarification  - PCP  - memory issues: who has assessed and what is the treatment currently in place.  (Needs PCP appt).  - med management  - Safety:  living alone, memory issues, (patient unable to provide address without coaching). H/o not understanding medication instructions.    - patient not weighing, checking BP or able to provide self disease management   - needs for S.W. To discuss further possible level of care needs in the home.  -RN CM will send initial assessment and barriers letter to primary MD once MD verified with daughter.    RN CM advised to please notify MD of any changes in condition prior to scheduled appt's.  RN CM provided contact name and # [phone] or main office # [phone] and 24-hour nurse line # [phone].  RN CM confirmed patient is aware of 911 services for urgent emergency needs.  Margaret Schultz, RN, BSN, [facility], CCM  [facility] [employer] Management Coordinator [phone] Direct [phone] Cell [phone] Office [phone] Fax

## 2015-02-08 ENCOUNTER — Other Ambulatory Visit: Payer: Self-pay

## 2015-02-08 NOTE — Patient Outreach (Signed)
[facility]) Care Management  02/08/2015  [name] July 12, 1945 [id number]   Inbound call returned back to RN CM from daughter, [name].    RN CM discussed concerns found on [facility] Telephonic RN CM assessment. -Memory issues:  living alone, still driving h/o recently not understanding her medication instructions and continued taking a medication that had a modified dosage change.  Patient unable to provide address.  Daughter is not aware of any diagnosed issues relating to memory but reports h/o patient coding twice and did not state she had noted any memory issues.    RN CM advised in need for MD appt to assess memory issues and perhaps provide option of treatment if cause can be determined.  Advised of importance of providing safety for patient and discussed concerns with patient driving and the possible risk of not remembering how to get back home.     -No PCP:   daughter confirms no Primary MD and has not seen a PCP in a very long time.  States she does not have a preference on a Primary MD and consents to allow RN CM to locate a Primary MD taking new patients.  -Caregiver assistance:  Patient and daughter have not discussed any long term plans for patient when level of care changes and patient needs additional services.  Stated daughter lives across the street but daughter confirms she lives across town and is not that close.  Daughter states she works and has a 1 month old and is not able to go with patient to any appt's.  States she has limited time to help her mother but does go by to prepare her medication box weekly and recently updated the box with CHF Clinic updates.  States unable to afford any care for her mother such as ILF/ALF.  RN CM advised in option of [facility] SW to assist with any needs.  Daughter agreed to assist patient with phone communication from [facility] to facilitate most effective care.  Advanced Directive:  Not discussed this call due to daughter had  limited time for call (working) and had to end call.   Plan:   RN CM will search for Primary MD accepting new patients and Medicare.  RN CM will notify [facility] Pharmacist to contact daughter rather than patient for medication review.   Donato Schultz, RN, BSN, [facility], CCM  [facility] [employer] Management Coordinator [phone] Direct [phone] Cell [phone] Office [phone] Fax

## 2015-02-08 NOTE — Patient Outreach (Signed)
[facility]) Care Management  02/08/2015  Jamarria Jubb 1945-07-19 [id number]   Inbound voice message received from daughter, [name].   States can be reached at [phone].    Outbound call to daughter but not reached.  RN CM left HIPAA compliant voice mail message with name and number.    Donato Schultz, RN, BSN, [facility], CCM  [facility] [employer] Management Coordinator [phone] Direct [phone] Cell [phone] Office [phone] Fax

## 2015-02-13 ENCOUNTER — Ambulatory Visit (HOSPITAL_COMMUNITY)
Admission: RE | Admit: 2015-02-13 | Discharge: 2015-02-13 | Disposition: A | Discharge status: Home / Self Care | Payer: Medicare Other | Source: Ambulatory Visit | Attending: Cardiology | Admitting: Cardiology

## 2015-02-13 DIAGNOSIS — I5022 Chronic systolic (congestive) heart failure: Secondary | ICD-10-CM

## 2015-02-13 LAB — BASIC METABOLIC PANEL
Anion gap: 11 (ref 5–15)
BUN: 26 mg/dL — ABNORMAL HIGH (ref 6–20)
CHLORIDE: 106 mmol/L (ref 101–111)
CO2: 24 mmol/L (ref 22–32)
Calcium: 10.3 mg/dL (ref 8.9–10.3)
Creatinine, Ser: 2.27 mg/dL — ABNORMAL HIGH (ref 0.44–1.00)
GFR calc non Af Amer: 21 mL/min — ABNORMAL LOW (ref >60)
GFR, EST AFRICAN AMERICAN: 25 mL/min — AB (ref >60)
GLUCOSE: 93 mg/dL (ref 65–99)
Potassium: 4.1 mmol/L (ref 3.5–5.1)
Sodium: 141 mmol/L (ref 135–145)

## 2015-02-14 ENCOUNTER — Other Ambulatory Visit: Payer: Self-pay

## 2015-02-14 NOTE — Patient Outreach (Signed)
[facility]) Care Management  02/14/2015  Remmi Langerman 11-29-1945 [id number]   Outreach call #1 to patient for Telephonic Monthly Assessment and Care Coordination services.  Patient not reached; no answer following several rings.  RN CM scheduled for next contact call within one week.  RN CM will mail patient letter to notify of new Primary MD appt.  RN CM will review appt details again on next contact call.  RN CM will notify daughter, [name] of appt details and MD office package being sent to patients home address.   Donato Schultz, RN, BSN, [facility], CCM  [facility] [employer] Management Coordinator [phone] Direct [phone] Cell [phone] Office [phone] Fax

## 2015-02-14 NOTE — Patient Outreach (Signed)
[facility]) Care Management  02/14/2015  Indria Dood February 16, 1945 [id number]   Care Coordination Services  Contact call to [facility] - Contact:  Lauri Issue:  New Primary MD appt needed  Appt scheduled for Tuesday 03/28/2015 @ 9:00 am with Dr. Austin Miles. [employer] will mail new patient packet to patients address.  RN CM verified mailing address.   Plan:   RN CM will notify patient and daughter of appt information.  RN CM will request daughter assist patient with completing package.   Donato Schultz, RN, BSN, [facility], CCM  [facility] [employer] Management Coordinator [phone] Direct [phone] Cell [phone] Office [phone] Fax

## 2015-02-15 ENCOUNTER — Ambulatory Visit (INDEPENDENT_AMBULATORY_CARE_PROVIDER_SITE_OTHER): Payer: Medicare Other | Admitting: Pharmacist Clinician (PhC)/ Clinical Pharmacy Specialist

## 2015-02-15 DIAGNOSIS — Z953 Presence of xenogenic heart valve: Secondary | ICD-10-CM | POA: Diagnosis not present

## 2015-02-15 DIAGNOSIS — Z7901 Long term (current) use of anticoagulants: Secondary | ICD-10-CM | POA: Diagnosis not present

## 2015-02-15 LAB — POCT INR: INR: 2.8

## 2015-02-17 ENCOUNTER — Other Ambulatory Visit: Payer: Self-pay | Admitting: Pharmacist

## 2015-02-17 NOTE — Patient Outreach (Signed)
[facility]) Care Management  [place]   02/17/2015  [name] August 21, 1945 [id number]  Subjective: [name] is a 70 y.o. female referred to pharmacy for medication review. Reviewed patient's allergy, medication and problem list in order to perform this evaluation.  Objective:   Current Medications: Current Outpatient Prescriptions  Medication Sig Dispense Refill  . amiodarone (PACERONE) 200 MG tablet Take 0.5 tablets (100 mg total) by mouth daily. 90 tablet 2  . carvedilol (COREG) 3.125 MG tablet Take 1 tablet (3.125 mg total) by mouth 2 (two) times daily with a meal. 60 tablet 3  . furosemide (LASIX) 20 MG tablet Take 1 tablet (20 mg total) by mouth daily. 30 tablet 6  . hydrALAZINE (APRESOLINE) 25 MG tablet Take 1 tablet (25 mg total) by mouth every 8 (eight) hours. 90 tablet 11  . isosorbide mononitrate (IMDUR) 30 MG 24 hr tablet Take 1 tablet (30 mg total) by mouth daily. 30 tablet 11  . pantoprazole (PROTONIX) 40 MG tablet Take 1 tablet (40 mg total) by mouth daily. 30 tablet 3  . saccharomyces boulardii (FLORASTOR) 250 MG capsule Take 1 capsule (250 mg total) by mouth 2 (two) times daily. 60 capsule 0  . simvastatin (ZOCOR) 40 MG tablet Take 1 tablet (40 mg total) by mouth daily. 30 tablet 11  . warfarin (COUMADIN) 2 MG tablet Take 1 tablet by mouth daily or as directed by coumadin clinic 30 tablet 6   No current facility-administered medications for this visit.    Assessment:   Drugs sorted by system:  Cardiovascular: amiodarone, carvedilol, furosemide, hydralazine, isosorbide moninitrate, simvastatin, warfarin  Gastrointestinal: pantoprazole, Florastor   Duplications in therapy: none noted  Gaps in therapy:  . Patient not currently on ACE-I or ARB for heart failure treatment. Per EPIC, ECHO on 12/14/14 showed a left ventricle EF of 25-30%. ACE-I or ARB therapy recommended in all patients with heart failure and an EF  <40% due to improved mortality benefit. Per recent heart failure clinic note from Amy Clegg/Dr. Aundra Dubin on 02/06/15, provider stated "hold off on ACEI/ARB now with elevated creatinine". Note that serum creatinine on 02/06/15 was 3.06 mg/dL per EPIC record. Clinic with 1 month follow up appointment for patient.  Medications to avoid in the elderly: none noted  Drug interactions:  . Warfarin + amiodarone: Amiodarone may increase the serum concentration and enhance the anticoagulant effect of warfarin. Patient has been on both of these maintenance medications and is followed by Coumadin Clinic. Per 02/15/15 Coumadin Clinic note, clinic aware of amiodarone dose decrease on 02/06/15. . Amiodarone + simvastatin: Amiodarone may increase the serum concentration of Simvastatin. Note patient has been on both maintenance medications long-term and most recent AST and ALT in EPIC on 12/16/14 were normal and amiodarone dose recently decreased. However, note that simvastatin and amiodarone prescribing information both state that due to the risk of statin-related muscle toxicities, including rhabdomyolysis, the dose of simvastatin should be limited to 20 mg daily if combined with amiodarone.  Other issues noted:  Per EPIC, on 02/13/15, patient with a serum creatinine of 2.27 mg/dL, with a calculated eGFR of 25 mL/min.  Plan:  1) Will send a message within this encounter to Amy Clegg/Dr. Aundra Dubin, recommending that at upcoming visit on 03/08/15 patient be evaluated for evidence of simvastatin toxicities (eg, myalgia, liver function test elevations, rhabdomyolysis). Will also recommend recheck of patient's serum creatinine and consideration of adding an ACE-inhibitor or ARB at that time.  Harlow Asa, PharmD Clinical Pharmacist [place] [facility] [phone]

## 2015-02-20 ENCOUNTER — Other Ambulatory Visit: Payer: Self-pay

## 2015-02-20 NOTE — Patient Outreach (Signed)
[facility]) Care Management  02/20/2015  [name] 26-Jan-1945 [id number]  Care Coordination Services.   Inbound call received from Caregiver/Daughter/[facility]  [address], [place], [place] [address] [phone]  to confirm appointment information for new primary MD appt.   Your appointment has been scheduled for Tuesday, March 28, 2015 at 9:00 am  Dr. Lanora Manis A. [facility] [address] [place], [place] [address]  [phone]  Dr. Frutoso Chase office will mail patient a package with forms you will need to complete for your appointment.   RN CM advised to please help patient watch for package and help patient complete the forms.   RN CM advised will mail same letter to daughter that went to patient with appointment information.   Daughter appreciative of assistance and guidance.   Donato Schultz, RN, BSN, [facility], CCM  [facility] Coordinator [phone] Direct [phone] Cell [phone] Office [phone] Fax

## 2015-02-21 ENCOUNTER — Other Ambulatory Visit: Payer: Self-pay

## 2015-02-21 ENCOUNTER — Encounter (HOSPITAL_COMMUNITY): Payer: Self-pay

## 2015-02-21 ENCOUNTER — Inpatient Hospital Stay (HOSPITAL_COMMUNITY)
Admission: EM | Admit: 2015-02-21 | Discharge: 2015-02-24 | DRG: 065 | Disposition: A | Discharge status: Home / Self Care | Payer: Medicare Other | Source: Ambulatory Visit | Attending: Internal Medicine | Admitting: Internal Medicine

## 2015-02-21 ENCOUNTER — Ambulatory Visit (HOSPITAL_COMMUNITY)
Admission: RE | Admit: 2015-02-21 | Discharge: 2015-02-21 | Disposition: A | Discharge status: Home / Self Care | Payer: Medicare Other | Source: Ambulatory Visit | Attending: Family Medicine | Admitting: Family Medicine

## 2015-02-21 ENCOUNTER — Ambulatory Visit (INDEPENDENT_AMBULATORY_CARE_PROVIDER_SITE_OTHER): Payer: Medicare Other | Admitting: Family Medicine

## 2015-02-21 VITALS — BP 142/74 | HR 67 | Temp 98.8°F | Resp 16 | Ht 65.0 in | Wt 136.0 lb

## 2015-02-21 DIAGNOSIS — I672 Cerebral atherosclerosis: Secondary | ICD-10-CM | POA: Diagnosis present

## 2015-02-21 DIAGNOSIS — R42 Dizziness and giddiness: Secondary | ICD-10-CM

## 2015-02-21 DIAGNOSIS — Z954 Presence of other heart-valve replacement: Secondary | ICD-10-CM

## 2015-02-21 DIAGNOSIS — I639 Cerebral infarction, unspecified: Secondary | ICD-10-CM | POA: Diagnosis not present

## 2015-02-21 DIAGNOSIS — Z86718 Personal history of other venous thrombosis and embolism: Secondary | ICD-10-CM | POA: Diagnosis not present

## 2015-02-21 DIAGNOSIS — E785 Hyperlipidemia, unspecified: Secondary | ICD-10-CM | POA: Diagnosis not present

## 2015-02-21 DIAGNOSIS — Z8673 Personal history of transient ischemic attack (TIA), and cerebral infarction without residual deficits: Secondary | ICD-10-CM

## 2015-02-21 DIAGNOSIS — I272 Other secondary pulmonary hypertension: Secondary | ICD-10-CM | POA: Diagnosis present

## 2015-02-21 DIAGNOSIS — N183 Chronic kidney disease, stage 3 unspecified: Secondary | ICD-10-CM

## 2015-02-21 DIAGNOSIS — Z881 Allergy status to other antibiotic agents status: Secondary | ICD-10-CM

## 2015-02-21 DIAGNOSIS — Z952 Presence of prosthetic heart valve: Secondary | ICD-10-CM

## 2015-02-21 DIAGNOSIS — Z87891 Personal history of nicotine dependence: Secondary | ICD-10-CM

## 2015-02-21 DIAGNOSIS — I5042 Chronic combined systolic (congestive) and diastolic (congestive) heart failure: Secondary | ICD-10-CM | POA: Diagnosis present

## 2015-02-21 DIAGNOSIS — I130 Hypertensive heart and chronic kidney disease with heart failure and stage 1 through stage 4 chronic kidney disease, or unspecified chronic kidney disease: Secondary | ICD-10-CM | POA: Diagnosis present

## 2015-02-21 DIAGNOSIS — R413 Other amnesia: Secondary | ICD-10-CM | POA: Diagnosis not present

## 2015-02-21 DIAGNOSIS — I10 Essential (primary) hypertension: Secondary | ICD-10-CM

## 2015-02-21 DIAGNOSIS — I679 Cerebrovascular disease, unspecified: Secondary | ICD-10-CM | POA: Insufficient documentation

## 2015-02-21 DIAGNOSIS — R8299 Other abnormal findings in urine: Secondary | ICD-10-CM | POA: Diagnosis not present

## 2015-02-21 DIAGNOSIS — R4182 Altered mental status, unspecified: Secondary | ICD-10-CM

## 2015-02-21 DIAGNOSIS — R29700 NIHSS score 0: Secondary | ICD-10-CM | POA: Diagnosis present

## 2015-02-21 DIAGNOSIS — I6312 Cerebral infarction due to embolism of basilar artery: Secondary | ICD-10-CM | POA: Diagnosis not present

## 2015-02-21 DIAGNOSIS — R918 Other nonspecific abnormal finding of lung field: Secondary | ICD-10-CM | POA: Insufficient documentation

## 2015-02-21 DIAGNOSIS — N184 Chronic kidney disease, stage 4 (severe): Secondary | ICD-10-CM | POA: Diagnosis present

## 2015-02-21 DIAGNOSIS — Z7901 Long term (current) use of anticoagulants: Secondary | ICD-10-CM | POA: Diagnosis not present

## 2015-02-21 DIAGNOSIS — Z880 Allergy status to penicillin: Secondary | ICD-10-CM

## 2015-02-21 DIAGNOSIS — Z953 Presence of xenogenic heart valve: Secondary | ICD-10-CM

## 2015-02-21 DIAGNOSIS — Z8674 Personal history of sudden cardiac arrest: Secondary | ICD-10-CM | POA: Diagnosis not present

## 2015-02-21 DIAGNOSIS — I4891 Unspecified atrial fibrillation: Secondary | ICD-10-CM | POA: Diagnosis present

## 2015-02-21 DIAGNOSIS — E876 Hypokalemia: Secondary | ICD-10-CM | POA: Diagnosis not present

## 2015-02-21 DIAGNOSIS — I6359 Cerebral infarction due to unspecified occlusion or stenosis of other cerebral artery: Secondary | ICD-10-CM

## 2015-02-21 DIAGNOSIS — R791 Abnormal coagulation profile: Secondary | ICD-10-CM

## 2015-02-21 DIAGNOSIS — R8290 Unspecified abnormal findings in urine: Secondary | ICD-10-CM

## 2015-02-21 DIAGNOSIS — I63311 Cerebral infarction due to thrombosis of right middle cerebral artery: Secondary | ICD-10-CM | POA: Diagnosis not present

## 2015-02-21 DIAGNOSIS — I5031 Acute diastolic (congestive) heart failure: Secondary | ICD-10-CM

## 2015-02-21 DIAGNOSIS — N289 Disorder of kidney and ureter, unspecified: Secondary | ICD-10-CM | POA: Diagnosis not present

## 2015-02-21 DIAGNOSIS — R2990 Unspecified symptoms and signs involving the nervous system: Secondary | ICD-10-CM | POA: Insufficient documentation

## 2015-02-21 DIAGNOSIS — Z90710 Acquired absence of both cervix and uterus: Secondary | ICD-10-CM

## 2015-02-21 DIAGNOSIS — I361 Nonrheumatic tricuspid (valve) insufficiency: Secondary | ICD-10-CM | POA: Diagnosis not present

## 2015-02-21 LAB — POC MICROSCOPIC URINALYSIS (UMFC): Mucus: ABSENT

## 2015-02-21 LAB — CBC
HEMATOCRIT: 41 % (ref 36.0–46.0)
Hemoglobin: 13.3 g/dL (ref 12.0–15.0)
MCH: 28.2 pg (ref 26.0–34.0)
MCHC: 32.4 g/dL (ref 30.0–36.0)
MCV: 86.9 fL (ref 78.0–100.0)
PLATELETS: 217 10*3/uL (ref 150–400)
RBC: 4.72 MIL/uL (ref 3.87–5.11)
RDW: 15.3 % (ref 11.5–15.5)
WBC: 3.8 10*3/uL — ABNORMAL LOW (ref 4.0–10.5)

## 2015-02-21 LAB — AMMONIA: Ammonia: 31 umol/L (ref 16–53)

## 2015-02-21 LAB — POCT CBC
Granulocyte percent: 52.7 % (ref 37–80)
HCT, POC: 37.9 % (ref 37.7–47.9)
Hemoglobin: 12.5 g/dL (ref 12.2–16.2)
Lymph, poc: 1.3 (ref 0.6–3.4)
MCH, POC: 27.7 pg (ref 27–31.2)
MCHC: 32.9 g/dL (ref 31.8–35.4)
MCV: 84.2 fL (ref 80–97)
MID (cbc): 0.3 (ref 0–0.9)
MPV: 6.7 fL (ref 0–99.8)
POC Granulocyte: 1.7 — AB (ref 2–6.9)
POC LYMPH PERCENT: 38.9 %L (ref 10–50)
POC MID %: 8.4 % (ref 0–12)
Platelet Count, POC: 196 10*3/uL (ref 142–424)
RBC: 4.5 M/uL (ref 4.04–5.48)
RDW, POC: 16.5 %
WBC: 3.3 10*3/uL — AB (ref 4.6–10.2)

## 2015-02-21 LAB — DIFFERENTIAL
BASOS ABS: 0.1 10*3/uL (ref 0.0–0.1)
BASOS PCT: 2 %
EOS ABS: 0.1 10*3/uL (ref 0.0–0.7)
Eosinophils Relative: 4 %
Lymphocytes Relative: 40 %
Lymphs Abs: 1.3 10*3/uL (ref 0.7–4.0)
MONOS PCT: 16 %
Monocytes Absolute: 0.5 10*3/uL (ref 0.1–1.0)
NEUTROS ABS: 1.2 10*3/uL — AB (ref 1.7–7.7)
NEUTROS PCT: 38 %

## 2015-02-21 LAB — I-STAT TROPONIN, ED: Troponin i, poc: 0.02 ng/mL (ref 0.00–0.08)

## 2015-02-21 LAB — RAPID URINE DRUG SCREEN, HOSP PERFORMED
AMPHETAMINES: NOT DETECTED
BARBITURATES: NOT DETECTED
Benzodiazepines: NOT DETECTED
Cocaine: NOT DETECTED
OPIATES: NOT DETECTED
TETRAHYDROCANNABINOL: NOT DETECTED

## 2015-02-21 LAB — POCT URINALYSIS DIP (MANUAL ENTRY)
Bilirubin, UA: NEGATIVE
Blood, UA: NEGATIVE
Glucose, UA: NEGATIVE
Ketones, POC UA: NEGATIVE
Nitrite, UA: NEGATIVE
Protein Ur, POC: NEGATIVE
Spec Grav, UA: 1.01
Urobilinogen, UA: 0.2
pH, UA: 6.5

## 2015-02-21 LAB — COMPREHENSIVE METABOLIC PANEL
ALT: 18 U/L (ref 14–54)
AST: 29 U/L (ref 15–41)
Albumin: 4.4 g/dL (ref 3.5–5.0)
Alkaline Phosphatase: 62 U/L (ref 38–126)
Anion gap: 10 (ref 5–15)
BUN: 26 mg/dL — AB (ref 6–20)
CHLORIDE: 100 mmol/L — AB (ref 101–111)
CO2: 28 mmol/L (ref 22–32)
CREATININE: 2.03 mg/dL — AB (ref 0.44–1.00)
Calcium: 10.8 mg/dL — ABNORMAL HIGH (ref 8.9–10.3)
GFR calc Af Amer: 28 mL/min — ABNORMAL LOW (ref >60)
GFR calc non Af Amer: 24 mL/min — ABNORMAL LOW (ref >60)
Glucose, Bld: 141 mg/dL — ABNORMAL HIGH (ref 65–99)
Potassium: 3 mmol/L — ABNORMAL LOW (ref 3.5–5.1)
SODIUM: 138 mmol/L (ref 135–145)
Total Bilirubin: 2 mg/dL — ABNORMAL HIGH (ref 0.3–1.2)
Total Protein: 8.5 g/dL — ABNORMAL HIGH (ref 6.5–8.1)

## 2015-02-21 LAB — COMPLETE METABOLIC PANEL WITH GFR
ALT: 14 U/L (ref 6–29)
Albumin: 3.8 g/dL (ref 3.6–5.1)
Alkaline Phosphatase: 60 U/L (ref 33–130)
BUN: 25 mg/dL (ref 7–25)
CO2: 29 mmol/L (ref 20–31)
Creat: 1.99 mg/dL — ABNORMAL HIGH (ref 0.50–0.99)
GFR, Est African American: 29 mL/min — ABNORMAL LOW (ref >=60)
GFR, Est Non African American: 25 mL/min — ABNORMAL LOW (ref >=60)
Glucose, Bld: 76 mg/dL (ref 65–99)
Sodium: 139 mmol/L (ref 135–146)
Total Bilirubin: 1.8 mg/dL — ABNORMAL HIGH (ref 0.2–1.2)
Total Protein: 7.4 g/dL (ref 6.1–8.1)

## 2015-02-21 LAB — URINE MICROSCOPIC-ADD ON

## 2015-02-21 LAB — COMPLETE METABOLIC PANEL WITHOUT GFR
AST: 21 U/L (ref 10–35)
Calcium: 10.4 mg/dL (ref 8.6–10.4)
Chloride: 101 mmol/L (ref 98–110)
Potassium: 3.5 mmol/L (ref 3.5–5.3)

## 2015-02-21 LAB — URINALYSIS, ROUTINE W REFLEX MICROSCOPIC
BILIRUBIN URINE: NEGATIVE
Glucose, UA: NEGATIVE mg/dL
Hgb urine dipstick: NEGATIVE
Ketones, ur: NEGATIVE mg/dL
NITRITE: NEGATIVE
PH: 5.5 (ref 5.0–8.0)
Protein, ur: NEGATIVE mg/dL
SPECIFIC GRAVITY, URINE: 1.009 (ref 1.005–1.030)

## 2015-02-21 LAB — PROTIME-INR
INR: 1.65 — ABNORMAL HIGH (ref 0.00–1.49)
Prothrombin Time: 19.5 seconds — ABNORMAL HIGH (ref 11.6–15.2)

## 2015-02-21 LAB — GLUCOSE, POCT (MANUAL RESULT ENTRY): POC Glucose: 86 mg/dl (ref 70–99)

## 2015-02-21 LAB — APTT: APTT: 33 s (ref 24–37)

## 2015-02-21 LAB — ETHANOL

## 2015-02-21 MED ORDER — POTASSIUM CHLORIDE CRYS ER 20 MEQ PO TBCR
40.0000 meq | EXTENDED_RELEASE_TABLET | ORAL | Status: AC
  Administered 2015-02-21: 40 meq via ORAL
  Filled 2015-02-21: qty 2

## 2015-02-21 MED ORDER — HEPARIN (PORCINE) IN NACL 100-0.45 UNIT/ML-% IJ SOLN
12.0000 [IU]/kg/h | INTRAMUSCULAR | Status: DC
  Filled 2015-02-21: qty 250

## 2015-02-21 NOTE — ED Notes (Addendum)
Pt c/o altered mental status x 2 days.  Denies pain.  Pt's daughter reports pt got lost while trying to drive home from church x 2 days ago and Pt has vague recollection of past 2 days.  Pt is currently A & Ox 3.  When asked the date, Pt reports 1917 and then, 2117.  Pt's daughter reports the Pt was "a little" confused this morning, but seems to be returning to normal.  Pt had a CT scan this afternoon and sent over for evaluation.            Pt had several medication changes x 1 week ago.

## 2015-02-21 NOTE — Patient Outreach (Signed)
[facility]) Care Management  02/21/2015  [name] [birth year] [id number]  Care Coordination Services   Issue:  Primary MD appt needed to assess AMS changes.   Contact call to last known Primary MD per [facility] Medical Record.  Asencion Partridge. Neva Seat, MD [facility] [address] [place], [place] [address] [phone] Office contact confirms last appt 12/13/2015 with Dr. Neva Seat.     Dr. Neva Seat taking walk in appt's today but leaving at 2:00pm.    RN CM advised office family will be updated.   RN CM advised family work and please schedule patient to see ANY MD if unable to arrive prior to 2:00pm.    Plan:   RN CM notified daughter of this update via voice mail message.   Donato Schultz, RN, BSN, [facility], CCM  [facility] [employer] [facility] Coordinator [phone] Direct [phone] Cell [phone] Office [phone] Fax

## 2015-02-21 NOTE — ED Provider Notes (Signed)
CSN: [id number]     Arrival date & time 02/21/15  1651 History   First MD Initiated Contact with Patient 02/21/15 1919     Chief Complaint  Patient presents with  . Altered Mental Status     (Consider location/radiation/quality/duration/timing/severity/associated sxs/prior Treatment) HPI   Patient is a 70 year old female with hypertension hyperlipidemia status post mitral valve replacement, history of endocarditis presenting today with issues with memory since Sunday. Patient got lost in her way home on Sunday. Since then she's been more forgetful than usual. She went to urgent care today. Urgent care sent here for CAT scan. CAT scan shows subacute infarct. No focal weakness.  No fevers at home. No nausea no vomiting or diarrhea.  Level V caveat confusion. Most of history is delivered by daughter. According to daughter patient on aspirin or Plavix.  Past Medical History  Diagnosis Date  . Hypertension   . Hyperlipidemia   . S/P MVR (mitral valve replacement) 04/11/1998    #29 St. Jude bileaflet mechanical valve for bacterial endocarditis  . 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 S/P redo mitral valve replacement with bioprosthetic valve 07/25/2014    27 mm [facility] Mitral bovine bioprosthetic tissue valve  . Acute CHF (congestive heart failure) (HCC) 12/14/2014   Past Surgical History  Procedure Laterality Date  . Mitral valve replacement  04/11/1998    St. Jude mechanical MVR (severe MR, episode of streptococcal bacterial endocarditis - Dr. Cornelius Moras  . Tubal ligation    . Transthoracic echocardiogram  11/2009    EF=>55%; bi-leaflet St. Jude mech prosthesis; mild TR, RVSP elevated at 40-37mmHg, mod pulm HTN; trace AV regurg; mild pulm valve regurg  . Total abdominal  hysterectomy w/ bilateral salpingoophorectomy  12/14/1998  . Cardiac catheterization N/A 07/24/2014    Procedure: Right/Left Heart Cath and Coronary Angiography;  Surgeon: Iran Ouch, MD;  Location: [facility] INVASIVE CV LAB;  Service: Cardiovascular;  Laterality: N/A;  . Cardiac catheterization N/A 07/22/2014    Procedure: Fluoroscopy Guidance;  Surgeon: Lennette Bihari, MD;  Location: [facility] INVASIVE CV LAB;  Service: Cardiovascular;  Laterality: N/A;  . Mitral valve replacement N/A 07/25/2014    Procedure: REDO MITRAL VALVE REPLACEMENT (MVR);  Surgeon: Purcell Nails, MD;  Location: [facility] OR;  Service: Open Heart Surgery;  Laterality: N/A;  . Cardiac valve replacement    . Cardiac catheterization N/A 12/16/2014    Procedure: Right Heart Cath;  Surgeon: Laurey Morale, MD;  Location: [facility] INVASIVE CV LAB;  Service: Cardiovascular;  Laterality: N/A;   Family History  Problem Relation Age of Onset  . Lung cancer Mother   . Heart failure Maternal Grandmother   . Heart failure Maternal Grandfather    Social History  Substance Use Topics  . Smoking status: Former Smoker -- 0.12 packs/day    Types: Cigarettes    Quit date: 01/17/1973  . Smokeless tobacco: Never Used  . Alcohol Use: No   OB History    No data available     Review of Systems  Constitutional: Negative for fever, activity change and fatigue.  HENT: Negative for congestion.   Respiratory: Negative for shortness of breath.   Cardiovascular: Negative for chest pain.  Gastrointestinal: Negative for abdominal pain.  Genitourinary: Negative for dysuria.  Neurological: Negative for syncope, facial asymmetry,  speech difficulty and weakness.  Psychiatric/Behavioral: Positive for confusion and decreased concentration.      Allergies  Augmentin  Home Medications   Prior to Admission medications   Medication Sig Start Date End Date Taking? Authorizing Provider  amiodarone (PACERONE) 200 MG tablet Take 0.5 tablets (100 mg total) by  mouth daily. 02/06/15  Yes Laurey Morale, MD  carvedilol (COREG) 3.125 MG tablet Take 1 tablet (3.125 mg total) by mouth 2 (two) times daily with a meal. 02/06/15  Yes Laurey Morale, MD  furosemide (LASIX) 20 MG tablet Take 1 tablet (20 mg total) by mouth daily. Patient taking differently: Take 10 mg by mouth daily.  02/06/15  Yes Laurey Morale, MD  hydrALAZINE (APRESOLINE) 25 MG tablet Take 1 tablet (25 mg total) by mouth every 8 (eight) hours. 12/17/14  Yes Dwana Melena, PA-C  isosorbide mononitrate (IMDUR) 30 MG 24 hr tablet Take 1 tablet (30 mg total) by mouth daily. 12/17/14  Yes Dwana Melena, PA-C  pantoprazole (PROTONIX) 40 MG tablet Take 1 tablet (40 mg total) by mouth daily. 12/22/14  Yes Chrystie Nose, MD  simvastatin (ZOCOR) 40 MG tablet Take 1 tablet (40 mg total) by mouth daily. 08/10/14  Yes Daniel J Angiulli, PA-C  warfarin (COUMADIN) 2 MG tablet Take 1 tablet by mouth daily or as directed by coumadin clinic Patient taking differently: Take 1-2 mg by mouth daily. Take 1mg  moonday & Friday, take 2mg  on all other days 01/07/15  Yes Janetta Hora, PA-C  saccharomyces boulardii (FLORASTOR) 250 MG capsule Take 1 capsule (250 mg total) by mouth 2 (two) times daily. Patient not taking: Reported on 02/21/2015 08/10/14   Mcarthur Rossetti Angiulli, PA-C   BP 113/77 mmHg  Pulse 50  Temp(Src) 98.7 F (37.1 C) (Oral)  Resp 17  SpO2 98% Physical Exam  Constitutional: She appears well-developed and well-nourished.  HENT:  Head: Normocephalic and atraumatic.  Eyes: Conjunctivae are normal. Right eye exhibits no discharge.  Neck: Neck supple.  Cardiovascular: Normal rate and regular rhythm.   Murmur heard. Patient has murmur on exam.  Pulmonary/Chest: Effort normal and breath sounds normal. She has no wheezes. She has no rales.  Scar midline.  Abdominal: Soft. She exhibits no distension. There is no tenderness.  Musculoskeletal: Normal range of motion. She exhibits no edema.   Neurological: No cranial nerve deficit.  Oriented to person. No pronator drift. Finger-to-nose is relatively intact. No gross facial weakness.  Skin: Skin is warm and dry. No rash noted. She is not diaphoretic.  Nursing note and vitals reviewed.   ED Course  Procedures (including critical care time) Labs Review Labs Reviewed  COMPREHENSIVE METABOLIC PANEL - Abnormal; Notable for the following:    Potassium 3.0 (*)    Chloride 100 (*)    Glucose, Bld 141 (*)    BUN 26 (*)    Creatinine, Ser 2.03 (*)    Calcium 10.8 (*)    Total Protein 8.5 (*)    Total Bilirubin 2.0 (*)    GFR calc non Af Amer 24 (*)    GFR calc Af Amer 28 (*)    All other components within normal limits  CBC - Abnormal; Notable for the following:    WBC 3.8 (*)    All other components within normal limits  PROTIME-INR - Abnormal; Notable for the following:    Prothrombin Time 19.5 (*)    INR 1.65 (*)    All other components within normal limits  URINALYSIS, ROUTINE W REFLEX MICROSCOPIC (NOT AT [facility]) - Abnormal; Notable for the following:    Leukocytes, UA SMALL (*)    All other components within normal limits  URINE MICROSCOPIC-ADD ON - Abnormal; Notable for the following:    Squamous Epithelial / LPF 0-5 (*)    Bacteria, UA RARE (*)    All other components within normal limits  DIFFERENTIAL - Abnormal; Notable for the following:    Neutro Abs 1.2 (*)    All other components within normal limits  CULTURE, BLOOD (ROUTINE X 2)  CULTURE, BLOOD (ROUTINE X 2)  ETHANOL  APTT  URINE RAPID DRUG SCREEN, HOSP PERFORMED  I-STAT TROPOININ, ED    Imaging Review Ct Head Wo Contrast  02/21/2015  CLINICAL DATA:  Memory loss, altered mental status, dizziness EXAM: CT HEAD WITHOUT CONTRAST TECHNIQUE: Contiguous axial images were obtained from the base of the skull through the vertex without intravenous contrast. COMPARISON:  MRI 01/17/2007 FINDINGS: Encephalomalacia within the right cerebellar hemisphere from old  infarct. There is low-density within the right basal ganglia which is compatible with infarct, age indeterminate a concerning for acute to subacute infarct. No hemorrhage. No hydrocephalus or midline shift. No acute calvarial abnormality. IMPRESSION: Low-density within the right basal ganglia compatible with age-indeterminate infarct. This is suspicious for acute to subacute infarct. 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 Electronically Signed   By: Charlett Nose M.D.   On: 02/21/2015 16:32   I have personally reviewed and evaluated these images and lab results as part of my medical decision-making.   EKG Interpretation None      MDM   Final diagnoses:  None   patient is a 70 year old female with history of endocarditis and valve replacement, presenting today with new onset of neurologic symptoms since Sunday. Patient had difficulty remembering, time and space. Patient here with daughter currently. CT shows subacute infarct. We'll workup for stroke. Given the patient also has history of valve replacement adn murmur on exam concern for embolic, we will get blood cultures, continue to monitor.  Will discuss with neurology and plan to admit to medicine.  9:49 PM Discussed with nuerology, will admit for stroke work up at [facility].  Nance Mccombs Randall An, MD 02/21/15 2149

## 2015-02-21 NOTE — ED Notes (Signed)
Pt stated she cannot give a urine sample at this time. 

## 2015-02-21 NOTE — Progress Notes (Signed)
Chief Complaint:  Chief Complaint  Patient presents with  . Memory Loss    mostly since Sunday    HPI: [name] is a 70 y.o. female who reports to [facility] today complaining of memory changes since Sunday, per daughter she was driving around on the road after church was lost, pulled herself over and stopped on side of the road , she did not remember how to get home per police, she had a drooping or asymmetry in the right side of her face per daughter,  she stayed in pajamas all day and forgot to get dress. She did not remember the car event on Sunday. She has ahd lapse in memory. She was dizzy after chruch, going to the car. Denies  CP or SOB, no vision changes, no Headaches  All last week she felt the best in a while.  Patient has significant heart history CAD, MVR s/p valve replacement x 2, on warfarin. Valvular endocarditis, Thrombosis of prosthetic valve    Past Medical History  Diagnosis Date  . Hypertension   . Hyperlipidemia   . S/P MVR (mitral valve replacement) 04/11/1998    #29 St. Jude bileaflet mechanical valve for bacterial endocarditis  . 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 S/P redo mitral valve replacement with bioprosthetic valve 07/25/2014    27 mm [facility] Mitral bovine bioprosthetic tissue valve  . Acute CHF (congestive heart failure) (HCC) 12/14/2014   Past Surgical History  Procedure Laterality Date  . Mitral valve replacement  04/11/1998    St. Jude mechanical MVR (severe MR, episode of streptococcal bacterial endocarditis - Dr. Cornelius Moras  . Tubal ligation    . Transthoracic echocardiogram  11/2009    EF=>55%; bi-leaflet St. Jude mech prosthesis; mild TR, RVSP elevated at 40-41mmHg, mod pulm HTN; trace AV regurg; mild pulm valve regurg  .  Total abdominal hysterectomy w/ bilateral salpingoophorectomy  12/14/1998  . Cardiac catheterization N/A 07/24/2014    Procedure: Right/Left Heart Cath and Coronary Angiography;  Surgeon: Iran Ouch, MD;  Location: [facility] INVASIVE CV LAB;  Service: Cardiovascular;  Laterality: N/A;  . Cardiac catheterization N/A 07/22/2014    Procedure: Fluoroscopy Guidance;  Surgeon: Lennette Bihari, MD;  Location: [facility] INVASIVE CV LAB;  Service: Cardiovascular;  Laterality: N/A;  . Mitral valve replacement N/A 07/25/2014    Procedure: REDO MITRAL VALVE REPLACEMENT (MVR);  Surgeon: Purcell Nails, MD;  Location: [facility] OR;  Service: Open Heart Surgery;  Laterality: N/A;  . Cardiac valve replacement    . Cardiac catheterization N/A 12/16/2014    Procedure: Right Heart Cath;  Surgeon: Laurey Morale, MD;  Location: [facility] INVASIVE CV LAB;  Service: Cardiovascular;  Laterality: N/A;   Social History   Social History  . Marital Status: Divorced    Spouse Name: N/A  . Number of Children: 3  . Years of Education: 14   Occupational History  .       Social History Main Topics  . Smoking status: Former Smoker -- 0.12 packs/day    Types: Cigarettes    Quit date: 01/17/1973  . Smokeless tobacco: Never Used  . Alcohol Use: No  . Drug Use: No  . Sexual Activity: Not Currently   Other Topics Concern  . None   Social  History Narrative   Family History  Problem Relation Age of Onset  . Lung cancer Mother   . Heart failure Maternal Grandmother   . Heart failure Maternal Grandfather    Allergies  Allergen Reactions  . Augmentin [Amoxicillin-Pot Clavulanate] Other (See Comments)    Unknown reaction   Prior to Admission medications   Medication Sig Start Date End Date Taking? Authorizing Provider  amiodarone (PACERONE) 200 MG tablet Take 0.5 tablets (100 mg total) by mouth daily. 02/06/15  Yes Laurey Morale, MD  carvedilol (COREG) 3.125 MG tablet Take 1 tablet (3.125 mg total) by mouth 2 (two) times daily with a  meal. 02/06/15  Yes Laurey Morale, MD  furosemide (LASIX) 20 MG tablet Take 1 tablet (20 mg total) by mouth daily. 02/06/15  Yes Laurey Morale, MD  hydrALAZINE (APRESOLINE) 25 MG tablet Take 1 tablet (25 mg total) by mouth every 8 (eight) hours. 12/17/14  Yes Dwana Melena, PA-C  isosorbide mononitrate (IMDUR) 30 MG 24 hr tablet Take 1 tablet (30 mg total) by mouth daily. 12/17/14  Yes Dwana Melena, PA-C  pantoprazole (PROTONIX) 40 MG tablet Take 1 tablet (40 mg total) by mouth daily. 12/22/14  Yes Chrystie Nose, MD  simvastatin (ZOCOR) 40 MG tablet Take 1 tablet (40 mg total) by mouth daily. 08/10/14  Yes Daniel J Angiulli, PA-C  warfarin (COUMADIN) 2 MG tablet Take 1 tablet by mouth daily or as directed by coumadin clinic 01/07/15  Yes Janetta Hora, PA-C  saccharomyces boulardii (FLORASTOR) 250 MG capsule Take 1 capsule (250 mg total) by mouth 2 (two) times daily. Patient not taking: Reported on 02/21/2015 08/10/14   Mcarthur Rossetti Angiulli, PA-C     ROS: The patient denies fevers, chills, night sweats, unintentional weight loss, chest pain, palpitations, wheezing, dyspnea on exertion, nausea, vomiting, abdominal pain, dysuria, hematuria, melena, numbness, weakness, or tingling.   All other systems have been reviewed and were otherwise negative with the exception of those mentioned in the HPI and as above.    PHYSICAL EXAM: Filed Vitals:   02/21/15 1400  BP: 142/74  Pulse: 67  Temp: 98.8 F (37.1 C)  Resp: 16   Body mass index is 22.63 kg/(m^2).   General: Alert, no acute distress HEENT:  Normocephalic, atraumatic, oropharynx patent. EOMI, PERRLA, TM normal ,fundo exam normal  Cardiovascular:  Regular rate and rhythm, no rubs or gallops.  No Carotid bruits, radial pulse intact. No pedal edema.  Respiratory: Clear to auscultation bilaterally.  No wheezes, rales, or rhonchi.  No cyanosis, no use of accessory musculature Abdominal: No organomegaly, abdomen is soft and non-tender,  positive bowel sounds. No masses. Skin: No rashes. Neurologic: Facial musculature asymmetric. Psychiatric: Patient acts appropriately throughout our interaction. Lymphatic: No cervical or submandibular lymphadenopathy Musculoskeletal: Gait intact. No edema, tenderness. 5/5 strength , 2/2 DTRs Romberg neg, heel to toe neg  President's name: can't think about his name Date-unsure Year- unknown DOB-1945/04/29 Daughter's DOB-Unsure Daughter's Name- [name]  Serial 7s is normal Able to spell world backwards Recall : 1/3 , able to rememebr dog , not able to remember ball falg   Neuro-minimal left sided droop at the mouth    LABS: Results for orders placed or performed in visit on 02/21/15  POCT CBC  Result Value Ref Range   WBC 3.3 (A) 4.6 - 10.2 K/uL   Lymph, poc 1.3 0.6 - 3.4   POC LYMPH PERCENT 38.9 10 - 50 %L   MID (cbc) 0.3  0 - 0.9   POC MID % 8.4 0 - 12 %M   POC Granulocyte 1.7 (A) 2 - 6.9   Granulocyte percent 52.7 37 - 80 %G   RBC 4.50 4.04 - 5.48 M/uL   Hemoglobin 12.5 12.2 - 16.2 g/dL   HCT, POC 40.9 81.1 - 47.9 %   MCV 84.2 80 - 97 fL   MCH, POC 27.7 27 - 31.2 pg   MCHC 32.9 31.8 - 35.4 g/dL   RDW, POC 91.4 %   Platelet Count, POC 196 142 - 424 K/uL   MPV 6.7 0 - 99.8 fL  POCT Microscopic Urinalysis ([facility])  Result Value Ref Range   WBC,UR,HPF,POC None None WBC/hpf   RBC,UR,HPF,POC None None RBC/hpf   Bacteria None None, Too numerous to count   Mucus Absent Absent   Epithelial Cells, UR Per Microscopy Few (A) None, Too numerous to count cells/hpf  POCT urinalysis dipstick  Result Value Ref Range   Color, UA yellow yellow   Clarity, UA clear clear   Glucose, UA negative negative   Bilirubin, UA negative negative   Ketones, POC UA negative negative   Spec Grav, UA 1.010    Blood, UA negative negative   pH, UA 6.5    Protein Ur, POC negative negative   Urobilinogen, UA 0.2    Nitrite, UA Negative Negative   Leukocytes, UA Trace (A) Negative  POCT glucose  (manual entry)  Result Value Ref Range   POC Glucose 86 70 - 99 mg/dl     EKG/XRAY:   Primary read interpreted by Dr. Conley Rolls at [facility].   ASSESSMENT/PLAN: Encounter Diagnoses  Name Primary?  . Memory loss Yes  . Altered mental status, unspecified altered mental status type   . Dizziness   . Function kidney decreased   . Essential hypertension   . HLD (hyperlipidemia)   . History of mitral valve replacement   . Abnormal urine    70 y/o female with PMH of HTN, HLD, endocarditis, MVR s/p valve replacement x 2 on coumadin, AKI x 1 month trending down today  who presents with stroke like sxs and AMS since SUnday Labs pending Stat CT head to rule out CVA, NPH, malignancy Fu with CT  CT results  called to me, she will go to [facility] for further evaluation , [facility] triage notified.             CLINICAL DATA: Memory loss, altered mental status, dizziness  EXAM: CT HEAD WITHOUT CONTRAST  TECHNIQUE: Contiguous axial images were obtained from the base of the skull through the vertex without intravenous contrast.  COMPARISON: MRI 01/17/2007  FINDINGS: Encephalomalacia within the right cerebellar hemisphere from old infarct. There is low-density within the right basal ganglia which is compatible with infarct, age indeterminate a concerning for acute to subacute infarct. No hemorrhage. No hydrocephalus or midline shift. No acute calvarial abnormality.  IMPRESSION: Low-density within the right basal ganglia compatible with age-indeterminate infarct. This is suspicious for acute to subacute infarct.  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   Electronically Signed  By: Charlett Nose M.D.  On: 02/21/2015 16:32  Gross sideeffects, risk and benefits, and alternatives of medications d/w patient. Patient is aware that all medications have potential sideeffects and we are unable to predict every sideeffect or drug-drug interaction that may occur.  Thao Le DO    02/21/2015 5:04 PM

## 2015-02-21 NOTE — Patient Outreach (Addendum)
[facility]) Care Management  02/21/2015  [name] 06/11/1945 [id number]   Inbound call received from daughter confirming she received MD appt instructions.   Daughter states she is working and patient's son is also working and no option to take patient to MD appt today.   States she is only off on Fridays and was not able to get an appt update for that day.   RN CM confirmed patient has no other option such as friend, neighbor and other family member to assist. RN CM suggested family member should attend appt with patient to assist with reporting changes and understanding instructions due to patient's memory changes.  (Daughter agreed and stated she would attend appt with patient).  RN CM encouraged and reminded that office takes walk-in appt's and patient was last seen by Dr. Neva Seat on 12/13/2015.  RN CM routed identifiable MD physicians barrier letter, initial assessment and care plan updates 02/21/2015  Donato Schultz, RN, BSN, [facility], CCM  [facility] [employer] [facility] Care Management Coordinator [phone] Direct [phone] Cell [phone] Office [phone] Fax

## 2015-02-21 NOTE — Progress Notes (Signed)
ANTICOAGULATION CONSULT NOTE - Initial Consult  Pharmacy Consult for IV heparin Indication: Subtherapeutic INR with St Jude mechanical valve    Patient Measurements:   Heparin Dosing Weight: 61 kg  Vital Signs: Temp: 98.7 F (37.1 C) (02/14 2026) Temp Source: Oral (02/14 1723) BP: 120/86 mmHg (02/14 2327) Pulse Rate: 52 (02/14 2327)  Labs:  Recent Labs  02/21/15 1454 02/21/15 1510 02/21/15 1838 02/21/15 2034  HGB  --  12.5 13.3  --   HCT  --  37.9 41.0  --   PLT  --   --  217  --   APTT  --   --   --  33  LABPROT  --   --  19.5*  --   INR  --   --  1.65*  --   CREATININE 1.99*  --  2.03*  --     Estimated Creatinine Clearance: 24.2 mL/min (by C-G formula based on Cr of 2.03).   Medical History: Past Medical History  Diagnosis Date  . Hypertension   . Hyperlipidemia   . S/P MVR (mitral valve replacement) 04/11/1998    #29 St. Jude bileaflet mechanical valve for bacterial endocarditis  . 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 S/P redo mitral valve replacement with bioprosthetic valve 07/25/2014    27 mm [facility] Mitral bovine bioprosthetic tissue valve  . Acute CHF (congestive heart failure) (HCC) 12/14/2014    Medications:  Scheduled:  . potassium chloride SA  40 mEq Oral STAT   Infusions:  . heparin      Assessment: 65 yoF c/o altered mental status sent from [facility] for CAT scan.  Scan shows subacute infarct.  Pt on chronic warfarin for St Jude valve with INR=1.65 on admission.  IV heparin per Rx.  Goal of Therapy:  Heparin level 0.3-0.7 units/ml Monitor platelets by anticoagulation protocol: Yes   Plan:   Heparin 750 units/hr (no bolus)  Daily CBC/HL  Check 1st HL in 8 hours (CrCl<30)  [name] 02/21/2015,11:45 PM

## 2015-02-21 NOTE — Patient Instructions (Signed)
YOU ARE TO GO OVER TO [facility].  GO TO THE MAIN ENTRANCE, THEN TO ADMITTING.  LET THEM KNOW THAT YOU ARE THERE FOR A CT SCAN AND THEY WILL GET YOU WERE YOU NEED TO GO.

## 2015-02-21 NOTE — H&P (Signed)
[facility] History and Physical  Borghild Thaker WUJ:[id number] DOB: 03/13/1945 DOA: 02/21/2015  Referring physician: [facility] PCP: Shade Flood, MD   Chief Complaint: Confusion  HPI:  Ms. good is a 70 year old female with a past medical history significant for HTN, HLD, s/p MVR ( St. Jude mechanical valve s/p thrombosis/cardiac arrest and revision ), valvular endocarditis in 2000, and combined systolic and diastolic CHF(last echo in 12/2014 EF 25-30% with grade 3 diastolic dysfunction); who presents with complaints of confusion for the past 3 days. History is obtained from the patient's daughter she is unclear of details surrounding her admission. Symptoms apparently started after patient was driving home from church on 2/11. She forgot how to get home and called her daughter who came and picked her up and took her home. Thereafter the daughter states that on the following day she stayed in her pajamas all day which was unusual for her. Then today, her son had called who will pick up the car but the patient cannot remember where the car was what happened to it. They noted that she was still in her pajamas and took her to urgent care to be further evaluated. Upon evaluation at the urgent care it was recommended that she come to the emergency department to have a CT scan done. Initial CT scan revealed a low density area within the right basal ganglia suspicious for acute to subacute infarct. Patient was noted to have a subtherapeutic INR of 1.65.  Review of Systems  Constitutional: Negative for fever, chills, malaise/fatigue and diaphoresis.  HENT: Negative for ear pain.   Eyes: Negative for photophobia and pain.  Respiratory: Negative for sputum production and shortness of breath.   Cardiovascular: Negative for chest pain, palpitations and leg swelling.  Gastrointestinal: Negative for vomiting, abdominal pain and diarrhea.  Genitourinary: Negative for urgency and frequency.   Musculoskeletal: Negative for back pain and neck pain.  Skin: Negative for itching and rash.  Neurological: Negative for sensory change, speech change, focal weakness and headaches.  Psychiatric/Behavioral: Positive for memory loss. Negative for hallucinations. The patient is not nervous/anxious.        Confusion.     Past Medical History  Diagnosis Date  . Hypertension   . Hyperlipidemia   . S/P MVR (mitral valve replacement) 04/11/1998    #29 St. Jude bileaflet mechanical valve for bacterial endocarditis  . 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 S/P redo mitral valve replacement with bioprosthetic valve 07/25/2014    27 mm [facility] Mitral bovine bioprosthetic tissue valve  . Acute CHF (congestive heart failure) (HCC) 12/14/2014     Past Surgical History  Procedure Laterality Date  . Mitral valve replacement  04/11/1998    St. Jude mechanical MVR (severe MR, episode of streptococcal bacterial endocarditis - Dr. Cornelius Moras  . Tubal ligation    . Transthoracic echocardiogram  11/2009    EF=>55%; bi-leaflet St. Jude mech prosthesis; mild TR, RVSP elevated at 40-61mmHg, mod pulm HTN; trace AV regurg; mild pulm valve regurg  . Total abdominal hysterectomy w/ bilateral salpingoophorectomy  12/14/1998  . Cardiac catheterization N/A 07/24/2014    Procedure: Right/Left Heart Cath and Coronary Angiography;  Surgeon: Iran Ouch, MD;  Location: [facility] INVASIVE CV LAB;  Service: Cardiovascular;  Laterality: N/A;  . Cardiac catheterization  N/A 07/22/2014    Procedure: Fluoroscopy Guidance;  Surgeon: Lennette Bihari, MD;  Location: [facility] INVASIVE CV LAB;  Service: Cardiovascular;  Laterality: N/A;  . Mitral valve replacement N/A 07/25/2014    Procedure: REDO MITRAL VALVE REPLACEMENT (MVR);  Surgeon:  Purcell Nails, MD;  Location: [facility];  Service: Open Heart Surgery;  Laterality: N/A;  . Cardiac valve replacement    . Cardiac catheterization N/A 12/16/2014    Procedure: Right Heart Cath;  Surgeon: Laurey Morale, MD;  Location: [facility] INVASIVE CV LAB;  Service: Cardiovascular;  Laterality: N/A;      Social History:  reports that she quit smoking about 42 years ago. Her smoking use included Cigarettes. She smoked 0.12 packs per day. She has never used smokeless tobacco. She reports that she does not drink alcohol or use illicit drugs. Where does patient live--home  and with whom if at home? alone Can patient participate in ADLs? Yes  Allergies  Allergen Reactions  . Augmentin [Amoxicillin-Pot Clavulanate] Other (See Comments)    Unknown reaction      Family History  Problem Relation Age of Onset  . Lung cancer Mother   . Heart failure Maternal Grandmother   . Heart failure Maternal Grandfather         Prior to Admission medications   Medication Sig Start Date End Date Taking? Authorizing Provider  amiodarone (PACERONE) 200 MG tablet Take 0.5 tablets (100 mg total) by mouth daily. 02/06/15  Yes Laurey Morale, MD  carvedilol (COREG) 3.125 MG tablet Take 1 tablet (3.125 mg total) by mouth 2 (two) times daily with a meal. 02/06/15  Yes Laurey Morale, MD  furosemide (LASIX) 20 MG tablet Take 1 tablet (20 mg total) by mouth daily. Patient taking differently: Take 10 mg by mouth daily.  02/06/15  Yes Laurey Morale, MD  hydrALAZINE (APRESOLINE) 25 MG tablet Take 1 tablet (25 mg total) by mouth every 8 (eight) hours. 12/17/14  Yes Dwana Melena, PA-C  isosorbide mononitrate (IMDUR) 30 MG 24 hr tablet Take 1 tablet (30 mg total) by mouth daily. 12/17/14  Yes Dwana Melena, PA-C  pantoprazole (PROTONIX) 40 MG tablet Take 1 tablet (40 mg total) by mouth daily. 12/22/14  Yes Chrystie Nose, MD  simvastatin (ZOCOR) 40 MG tablet Take 1 tablet (40 mg total) by mouth daily. 08/10/14  Yes  Daniel J Angiulli, PA-C  warfarin (COUMADIN) 2 MG tablet Take 1 tablet by mouth daily or as directed by coumadin clinic Patient taking differently: Take 1-2 mg by mouth daily. Take 1mg  moonday & Friday, take 2mg  on all other days 01/07/15  Yes Janetta Hora, PA-C  saccharomyces boulardii (FLORASTOR) 250 MG capsule Take 1 capsule (250 mg total) by mouth 2 (two) times daily. Patient not taking: Reported on 02/21/2015 08/10/14   Charlton Amor, PA-C     Physical Exam: Filed Vitals:   02/21/15 2043 02/21/15 2100 02/21/15 2130 02/21/15 2200  BP: 114/80 108/82 113/77 100/69  Pulse: 52 54 50 56  Temp:      TempSrc:      Resp: 14 17 17 17   SpO2: 99% 97% 98% 96%     Constitutional: Vital signs reviewed. Patient is a well-developed and well-nourished in no acute distress and cooperative with exam. Alert and oriented x3.  Head: Normocephalic and atraumatic  Ear: TM normal bilaterally  Mouth: no erythema or exudates, MMM  Eyes: PERRL, EOMI, conjunctivae normal, No scleral icterus.  Neck: Supple, Trachea  midline normal ROM, No JVD, mass, thyromegaly, or carotid bruit present.  Cardiovascular: Bradycardic with positive murmur. Healed midline scar of the chest from previous surgery. Pulmonary/Chest: CTAB, no wheezes, rales, or rhonchi  Abdominal: Soft. Non-tender, non-distended, bowel sounds are normal, no masses, organomegaly, or guarding present.  GU: no CVA tenderness Musculoskeletal: No joint deformities, erythema, or stiffness, ROM full and no nontender Ext: no edema and no cyanosis, pulses palpable bilaterally (DP and PT)  Hematology: no cervical, inginal, or axillary adenopathy.  Neurological: A&O x3, Strenght is normal and symmetric bilaterally, cranial nerve II-XII are grossly intact, no focal motor deficit, sensory intact to light touch bilaterally.  Skin: Warm, dry and intact. No rash, cyanosis, or clubbing.  Psychiatric: Normal mood and affect. speech and behavior is normal.  Judgment and thought content normal. Cognition and memory are abnormal     Data Review   Micro Results No results found for this or any previous visit (from the past 240 hour(s)).  Radiology Reports Ct Head Wo Contrast  02/21/2015  CLINICAL DATA:  Memory loss, altered mental status, dizziness EXAM: CT HEAD WITHOUT CONTRAST TECHNIQUE: Contiguous axial images were obtained from the base of the skull through the vertex without intravenous contrast. COMPARISON:  MRI 01/17/2007 FINDINGS: Encephalomalacia within the right cerebellar hemisphere from old infarct. There is low-density within the right basal ganglia which is compatible with infarct, age indeterminate a concerning for acute to subacute infarct. No hemorrhage. No hydrocephalus or midline shift. No acute calvarial abnormality. IMPRESSION: Low-density within the right basal ganglia compatible with age-indeterminate infarct. This is suspicious for acute to subacute infarct. 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 Electronically Signed   By: Charlett Nose M.D.   On: 02/21/2015 16:32     CBC  Recent Labs Lab 02/21/15 1510 02/21/15 1838  WBC 3.3* 3.8*  HGB 12.5 13.3  HCT 37.9 41.0  PLT  --  217  MCV 84.2 86.9  MCH 27.7 28.2  MCHC 32.9 32.4  RDW  --  15.3  LYMPHSABS  --  1.3  MONOABS  --  0.5  EOSABS  --  0.1  BASOSABS  --  0.1    Chemistries   Recent Labs Lab 02/21/15 1838  NA 138  K 3.0*  CL 100*  CO2 28  GLUCOSE 141*  BUN 26*  CREATININE 2.03*  CALCIUM 10.8*  AST 29  ALT 18  ALKPHOS 62  BILITOT 2.0*   ------------------------------------------------------------------------------------------------------------------ estimated creatinine clearance is 24.2 mL/min (by C-G formula based on Cr of 2.03). ------------------------------------------------------------------------------------------------------------------ No results for input(s): HGBA1C in the last 72  hours. ------------------------------------------------------------------------------------------------------------------ No results for input(s): CHOL, HDL, LDLCALC, TRIG, CHOLHDL, LDLDIRECT in the last 72 hours. ------------------------------------------------------------------------------------------------------------------ No results for input(s): TSH, T4TOTAL, T3FREE, THYROIDAB in the last 72 hours.  Invalid input(s): FREET3 ------------------------------------------------------------------------------------------------------------------ No results for input(s): VITAMINB12, FOLATE, FERRITIN, TIBC, IRON, RETICCTPCT in the last 72 hours.  Coagulation profile  Recent Labs Lab 02/15/15 0947 02/21/15 1838  INR 2.8 1.65*    No results for input(s): DDIMER in the last 72 hours.  Cardiac Enzymes No results for input(s): CKMB, TROPONINI, MYOGLOBIN in the last 168 hours.  Invalid input(s): CK ------------------------------------------------------------------------------------------------------------------ Invalid input(s): POCBNP   CBG: No results for input(s): GLUCAP in the last 168 hours.     EKG: Independently reviewed. Sinus rhythm with a ventricular rate of 55   Assessment/Plan Right basal ganglia infarct: Subacute. Exact cause of infarct unknown at this time and question question the possibility of underlying - Admit to   to telemetry bed  - Consult neurology upon arrival - Recheck echocardiogram  - Bilateral carotid Dopplers - PT/OT/speech to eval and treat - Follow-up telemetry - Will follow-up on neurology recommendations  -  MRI/MRA Not ordered yet because wanted to verify with the patient's specific mechanical heart valve  S/p mechanical valve replacement on chronic anticoagulation therapy: Patient is on chronic anticoagulation therapy with a goal INR between 2.5 and 3.5. Patient's previous INR on 2/8 was 2.8. - will start on heparin drip given risk  with mechanical valve normal goal INR between 2.5 and 3.5 - Pharmacy to dose Coumadin  Chronic kidney disease stage 4: Patient at her baseline with a creatinine that varies between 2 and 3. Patient denies having a nephrologist in which she sees on a regular basis. - Continue to monitor - May want to set up patient with a nephrologist as an outpatient  Hypokalemia: Initial potassium 3.0  - replace potassium what 40 mEq 1 dose now  Mild hypercalcemia: Calcium 10.8 initial blood work - Continue to monitor, pursue further workup if warranted  Subtherapeutic INR: Acute.  Code Status:   full Family Communication: bedside Disposition Plan: admit   Total time spent 55 minutes.Greater than 50% of this time was spent in counseling, explanation of diagnosis, planning of further management, and coordination of care  Clydie Braun [facility] Pager [phone]  If 7PM-7AM, please contact night-coverage [email or web address] Password [facility] 02/21/2015, 10:21 PM

## 2015-02-21 NOTE — Patient Outreach (Addendum)
[facility]) Care Management  02/21/2015  [name] 11/17/1945 [id number]  Telephone Assessment   Inbound call received from daughter, [name].  Issue:  States patient was driving on Sunday and ended up in [place], [place] and was lost.  States she/daughter had to go get her.  States patient does not recall getting lost or has any memory of this event happening.  States patients son is on his way to patients home to get the car so that patient will not be driving.  Daughter contacted [place] to attempt to move up NEW INITIAL appt for today but no appt's available.  Daughter states she does not know what to do and is not sure if patient should be home alone.    RN CM advised that patient should have supervised care during this time of AMS.  RN CM advised patient needs to be seen by a MD for AMS changes and assessment to determine associated cause.  RN CM advised RN CM will explore options and call daughter back with updates.   Daughter gives permission to leave voice mail message details due to works in dental office with patients and may not be accessible at time of call.   Donato Schultz, RN, BSN, [facility], CCM  [facility] [employer] Management Coordinator [phone] Direct [phone] Cell [phone] Office [phone] Fax

## 2015-02-21 NOTE — ED Notes (Signed)
Pt continues to be confused.  Pt came out to lobby and asked this RN why she's here.  This RN had an extensive conversation w/ the Pt previously about condition.

## 2015-02-22 ENCOUNTER — Inpatient Hospital Stay (HOSPITAL_COMMUNITY): Payer: Self-pay

## 2015-02-22 ENCOUNTER — Inpatient Hospital Stay (HOSPITAL_COMMUNITY): Payer: Medicare Other

## 2015-02-22 DIAGNOSIS — E876 Hypokalemia: Secondary | ICD-10-CM | POA: Diagnosis present

## 2015-02-22 DIAGNOSIS — N184 Chronic kidney disease, stage 4 (severe): Secondary | ICD-10-CM | POA: Diagnosis present

## 2015-02-22 DIAGNOSIS — I639 Cerebral infarction, unspecified: Secondary | ICD-10-CM

## 2015-02-22 DIAGNOSIS — R791 Abnormal coagulation profile: Secondary | ICD-10-CM | POA: Diagnosis present

## 2015-02-22 DIAGNOSIS — I361 Nonrheumatic tricuspid (valve) insufficiency: Secondary | ICD-10-CM

## 2015-02-22 LAB — LIPID PANEL
CHOLESTEROL: 181 mg/dL (ref 0–200)
HDL: 67 mg/dL (ref >40)
LDL Cholesterol: 107 mg/dL — ABNORMAL HIGH (ref 0–99)
TRIGLYCERIDES: 33 mg/dL (ref <150)
Total CHOL/HDL Ratio: 2.7 RATIO
VLDL: 7 mg/dL (ref 0–40)

## 2015-02-22 LAB — PROTIME-INR
INR: 2.06 — ABNORMAL HIGH (ref 0.00–1.49)
Prothrombin Time: 23.1 seconds — ABNORMAL HIGH (ref 11.6–15.2)

## 2015-02-22 LAB — TSH: TSH: 0.42 mIU/L

## 2015-02-22 LAB — HEPARIN LEVEL (UNFRACTIONATED): Heparin Unfractionated: 0.32 IU/mL (ref 0.30–0.70)

## 2015-02-22 MED ORDER — PANTOPRAZOLE SODIUM 40 MG PO TBEC
40.0000 mg | DELAYED_RELEASE_TABLET | Freq: Every day | ORAL | Status: DC
  Administered 2015-02-22 – 2015-02-24 (×3): 40 mg via ORAL
  Filled 2015-02-22 (×3): qty 1

## 2015-02-22 MED ORDER — ACETAMINOPHEN 650 MG RE SUPP: 650.0000 mg | RECTAL | Status: DC | PRN

## 2015-02-22 MED ORDER — HEPARIN (PORCINE) IN NACL 100-0.45 UNIT/ML-% IJ SOLN
750.0000 [IU]/h | INTRAMUSCULAR | Status: DC
  Administered 2015-02-22: 750 [IU]/h via INTRAVENOUS
  Filled 2015-02-22: qty 250

## 2015-02-22 MED ORDER — CARVEDILOL 3.125 MG PO TABS
3.1250 mg | ORAL_TABLET | Freq: Two times a day (BID) | ORAL | Status: DC
  Administered 2015-02-22 – 2015-02-23 (×3): 3.125 mg via ORAL
  Filled 2015-02-22 (×4): qty 1

## 2015-02-22 MED ORDER — WARFARIN - PHARMACIST DOSING INPATIENT
Freq: Every day | Status: DC
  Administered 2015-02-23: 17:00:00

## 2015-02-22 MED ORDER — SENNOSIDES-DOCUSATE SODIUM 8.6-50 MG PO TABS: 1.0000 | ORAL_TABLET | Freq: Every evening | ORAL | Status: DC | PRN

## 2015-02-22 MED ORDER — ACETAMINOPHEN 325 MG PO TABS: 650.0000 mg | ORAL_TABLET | ORAL | Status: DC | PRN

## 2015-02-22 MED ORDER — WARFARIN SODIUM 3 MG PO TABS
3.0000 mg | ORAL_TABLET | Freq: Once | ORAL | Status: AC
  Administered 2015-02-22: 3 mg via ORAL
  Filled 2015-02-22: qty 1

## 2015-02-22 MED ORDER — STROKE: EARLY STAGES OF RECOVERY BOOK
Freq: Once | Status: AC
  Administered 2015-02-22: 02:00:00

## 2015-02-22 MED ORDER — ISOSORBIDE MONONITRATE ER 30 MG PO TB24
30.0000 mg | ORAL_TABLET | Freq: Every day | ORAL | Status: DC
  Administered 2015-02-22 – 2015-02-24 (×3): 30 mg via ORAL
  Filled 2015-02-22 (×3): qty 1

## 2015-02-22 MED ORDER — HYDRALAZINE HCL 25 MG PO TABS
25.0000 mg | ORAL_TABLET | Freq: Three times a day (TID) | ORAL | Status: DC
  Administered 2015-02-22: 25 mg via ORAL
  Filled 2015-02-22 (×2): qty 1

## 2015-02-22 MED ORDER — WARFARIN SODIUM 2 MG PO TABS
2.0000 mg | ORAL_TABLET | Freq: Once | ORAL | Status: AC
  Administered 2015-02-22: 2 mg via ORAL
  Filled 2015-02-22: qty 1

## 2015-02-22 MED ORDER — FUROSEMIDE 20 MG PO TABS
10.0000 mg | ORAL_TABLET | Freq: Every day | ORAL | Status: DC
  Administered 2015-02-22 – 2015-02-24 (×3): 10 mg via ORAL
  Filled 2015-02-22 (×3): qty 1

## 2015-02-22 MED ORDER — AMIODARONE HCL 200 MG PO TABS
100.0000 mg | ORAL_TABLET | Freq: Every day | ORAL | Status: DC
  Administered 2015-02-22 – 2015-02-24 (×3): 100 mg via ORAL
  Filled 2015-02-22 (×3): qty 1

## 2015-02-22 MED ORDER — SIMVASTATIN 40 MG PO TABS
40.0000 mg | ORAL_TABLET | Freq: Every day | ORAL | Status: DC
  Administered 2015-02-22 – 2015-02-24 (×3): 40 mg via ORAL
  Filled 2015-02-22 (×3): qty 1

## 2015-02-22 NOTE — Evaluation (Signed)
Speech Language Pathology Evaluation Patient Details Name: [name] MRN: [id number] DOB: 1945/12/13 Today's Date: 02/22/2015 Time: 7253-6644 SLP Time Calculation (min) (ACUTE ONLY): 33 min  Problem List:  Patient Active Problem List   Diagnosis Date Noted  . Chronic kidney disease (CKD), stage IV (severe) (HCC) 02/22/2015  . Hypokalemia 02/22/2015  . Hypercalcemia 02/22/2015  . Subtherapeutic international normalized ratio (INR) 02/22/2015  . Stroke-like symptoms 02/21/2015  . Right basal ganglia embolic stroke (HCC) 02/21/2015  . Chronic systolic CHF (congestive heart failure) (HCC) 01/04/2015  . CKD (chronic kidney disease) stage 3, GFR 30-59 ml/min 01/04/2015  . Acute on chronic combined systolic and diastolic heart failure (HCC)   . AKI (acute kidney injury) (HCC)   . Acute diastolic CHF (congestive heart failure) (HCC) 12/14/2014  . Hypertensive heart disease with heart failure (HCC)   . S/P MVR (mitral valve replacement)   . Atrial fibrillation (HCC) 09/22/2014  . Debility 08/04/2014  . Anoxic encephalopathy (HCC) 08/04/2014  . Diastolic CHF (HCC) 08/04/2014  . 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 S/P redo mitral valve replacement with bioprosthetic valve 07/25/2014  . Prosthetic valve dysfunction 07/22/2014  . Shock (HCC) 07/21/2014  . Cardiac arrest (HCC) 07/21/2014  . Acute respiratory failure, unspecified whether with hypoxia or hypercapnia (HCC)   . Metabolic acidosis   . RBBB 03/11/2014  . HTN (hypertension) 01/23/2013  . Dyslipidemia 01/23/2013   Past Medical History:  Past Medical History  Diagnosis Date  . Hypertension   . Hyperlipidemia   . S/P MVR (mitral valve replacement) 04/11/1998    #29 St. Jude bileaflet mechanical valve for bacterial endocarditis  . 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 S/P redo mitral valve replacement with bioprosthetic valve 07/25/2014    27 mm [facility] Mitral bovine bioprosthetic tissue valve  . Acute CHF (congestive heart failure) (HCC) 12/14/2014   Past Surgical History:  Past Surgical History  Procedure Laterality Date  . Mitral valve replacement  04/11/1998    St. Jude mechanical MVR (severe MR, episode of streptococcal bacterial endocarditis - Dr. Cornelius Rodriguez  . Tubal ligation    . Transthoracic echocardiogram  11/2009    EF=>55%; bi-leaflet St. Jude mech prosthesis; mild TR, RVSP elevated at 40-36mmHg, mod pulm HTN; trace AV regurg; mild pulm valve regurg  . Total abdominal hysterectomy w/ bilateral salpingoophorectomy  12/14/1998  . Cardiac catheterization N/A 07/24/2014    Procedure: Right/Left Heart Cath and Coronary Angiography;  Surgeon: Margaret Ouch, MD;  Location: [facility] INVASIVE CV LAB;  Service: Cardiovascular;  Laterality: N/A;  . Cardiac catheterization N/A 07/22/2014    Procedure: Fluoroscopy Guidance;  Surgeon: Margaret Bihari, MD;  Location: [facility] INVASIVE CV LAB;  Service: Cardiovascular;  Laterality: N/A;  . Mitral valve replacement N/A 07/25/2014    Procedure: REDO MITRAL VALVE REPLACEMENT (MVR);  Surgeon: Margaret Nails, MD;  Location: [facility] OR;  Service: Open Heart Surgery;  Laterality: N/A;  . Cardiac valve replacement    . Cardiac catheterization N/A 12/16/2014    Procedure: Right Heart Cath;  Surgeon: Margaret Morale, MD;  Location: [facility] INVASIVE CV LAB;  Service: Cardiovascular;  Laterality: N/A;   HPI:  70 y.o. female with h/o HTN, HLD, s/p MVR (St. Judge mechanical valve s/p thrombosis/cardiac arrest and revision), valvular endocarditis (2000),  and combined systolic and diastrolic CHF. Pt presented to ED with c/o confusion over past 3 days. CT Head 2/14 low-density within the R basal ganglia compatile with age-indeterminate infarct (suspicious for acute to subacute  infarct).   Assessment / Plan / Recommendation Clinical Impression  Pt reported baseline memory deficits without use of compensatory strategies PTA. SLP administered Cognistat standardized assessment to evaluate cognition s/p CVA. Cognitive deficits found include memory (decreased from baseline) and reasoning impairments, which lead to negative impact on safety awareness and judgment and inability to live alone at home (24 hour supervision). SLP provided theurapeutic strategies to compensate for memory deficits. Pt and brother educated re: results of Cognistat. Recommend further inpatient SLP f/u and outpatient or home health SLP intervention upon d/c from [facility]    SLP Assessment  Patient needs continued Speech Lanaguage Pathology Services    Follow Up Recommendations  24 hour supervision/assistance;Outpatient SLP    Frequency and Duration min 2x/week  2 weeks      SLP Evaluation Prior Functioning  Cognitive/Linguistic Baseline: Baseline deficits (memory deficit since MI Simultaneous filing. 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[place])   Cognition  Overall Cognitive Status: Impaired/Different from baseline Arousal/Alertness: Awake/alert Orientation Level: Oriented X4 Attention: Sustained;Selective Sustained Attention: Appears intact Selective Attention: Appears intact Memory: Impaired Memory Impairment: Retrieval deficit;Decreased recall of new information;Decreased short term memory;Storage deficit Decreased Short Term Memory: Verbal basic;Functional basic Awareness: Impaired Awareness Impairment: Emergent impairment;Anticipatory impairment Problem Solving: Appears intact Executive Function: Reasoning;Self Monitoring;Self Correcting;Decision Making Reasoning: Appears intact Safety/Judgment: Impaired    Comprehension  Auditory  Comprehension Overall Auditory Comprehension: Appears within functional limits for tasks assessed Yes/No Questions: Not tested Commands: Within Functional Limits Conversation: Simple Interfering Components: Working Theatre manager: Not tested Reading Comprehension Reading Status: Not tested    Expression Expression Primary Mode of Expression: Verbal Verbal Expression Overall Verbal Expression: Appears within functional limits for tasks assessed Initiation: No impairment Level of Generative/Spontaneous Verbalization: Conversation Repetition: No impairment Naming: No impairment Pragmatics: No impairment Non-Verbal Means of Communication: Not applicable Written Expression Dominant Hand: Right Written Expression: Not tested   Oral / Motor  Oral Motor/Sensory Function Overall Oral Motor/Sensory Function: Within functional limits Motor Speech Overall Motor Speech: Appears within functional limits for tasks assessed Respiration: Within functional limits Phonation: Normal Resonance: Within functional limits Articulation: Within functional limitis Intelligibility: Intelligible Motor Planning: Witnin functional limits Motor Speech Errors: Not applicable   GO                    Idabell Picking 02/22/2015, 3:02 PM  Lynita Lombard, Student-SLP

## 2015-02-22 NOTE — Consult Note (Signed)
Neurology Consultation Reason for Consult: Stroke Referring Physician: Madelyn Flavors  CC: Confusion  History is obtained from: Patient, daughter  HPI: [name] is a 70 y.o. female with a history of mechanical heart valve who recently had her Coumadin dose decreased.  On Sunday, she was noted to be confused and since then the family has noticed that they have had to remind her frequently of the same thing. She also became lost recently, which is not something that is ever happened to her before.  Due to concerns about her memory loss, they took her to her outpatient physician today who performed a head CT which shows concern for stroke on the right including the head of caudate.  LKW: Sunday tpa given?: no, out of window  ROS: A 14 point ROS was performed and is negative except as noted in the HPI.   Past Medical History  Diagnosis Date  . Hypertension   . Hyperlipidemia   . S/P MVR (mitral valve replacement) 04/11/1998    #29 St. Jude bileaflet mechanical valve for bacterial endocarditis  . 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 S/P redo mitral valve replacement with bioprosthetic valve 07/25/2014    27 mm [facility] Mitral bovine bioprosthetic tissue valve  . Acute CHF (congestive heart failure) (HCC) 12/14/2014     Family History  Problem Relation Age of Onset  . Lung cancer Mother   . Heart failure Maternal Grandmother   . Heart failure Maternal Grandfather      Social History:  reports that she quit smoking about 42 years ago. Her smoking use included Cigarettes. She smoked 0.12 packs per day. She has never used smokeless tobacco. She reports that she does not drink alcohol or use illicit drugs.   Exam: Current vital signs: BP 116/89 mmHg  Pulse  56  Temp(Src) 98.1 F (36.7 C) (Oral)  Resp 16  SpO2 95% Vital signs in last 24 hours: Temp:  [98.1 F (36.7 C)-98.8 F (37.1 C)] 98.1 F (36.7 C) (02/15 0037) Pulse Rate:  [50-108] 56 (02/15 0037) Resp:  [11-18] 16 (02/15 0037) BP: (88-142)/(69-89) 116/89 mmHg (02/15 0037) SpO2:  [95 %-100 %] 95 % (02/15 0037) Weight:  [61.689 kg (136 lb)] 61.689 kg (136 lb) (02/14 1400)   Physical Exam  Constitutional: Appears well-developed and well-nourished.  Psych: Affect appropriate to situation Eyes: No scleral injection HENT: No OP obstrucion Head: Normocephalic.  Cardiovascular: Normal rate and regular rhythm.  Respiratory: Effort normal and breath sounds normal to anterior ascultation GI: Soft.  No distension. There is no tenderness.  Skin: WDI  Neuro: Mental Status: Patient is awake, alert, oriented to person, place, and situation. She is unable to give the month or year. Patient is able to give a clear and coherent history. No signs of aphasia or neglect She is able to give a number of quarters in $2.75 Cranial Nerves: II: Visual Fields are full. Pupils are equal, round, and reactive to light.   III,IV, VI: EOMI without ptosis or diploplia.  V: Facial sensation is symmetric to temperature VII: Facial movement is symmetric.  VIII: hearing is intact to voice X: Uvula elevates symmetrically XI: Shoulder shrug is symmetric. XII: tongue is midline without atrophy or fasciculations.  Motor: Tone is normal. Bulk is normal. 5/5 strength was present  in all four extremities.  Sensory: Sensation is symmetric to light touch and temperature in the arms and legs. Deep Tendon Reflexes: 2+ and symmetric in the biceps and patellae.  Plantars: Toes are downgoing bilaterally.  Cerebellar: FNF and HKS are intact bilaterally  I have reviewed labs in epic and the results pertinent to this consultation are: Mildly elevated creatinine INR 1.6  I have reviewed the images obtained: CT  head-hypodensity in the right consistent with stroke  Impression: 70 year old female with subcortical stroke on the right. My suspicion, though small vessel disease is a possibility, is that this is embolic due to mechanical heart valve with subtherapeutic INR. The stroke is relatively small in size. Though there is risk of hemorrhagic conversion with anticoagulation, there is also a risk with mechanical heart valve and therefore given the relatively small size of the stroke would favor starting anticoagulation at this time.  Recommendations: 1. HgbA1c, fasting lipid panel 2. MRI, MRA  of the brain without contrast 3. Frequent neuro checks 4. Echocardiogram 5. Carotid dopplers 6. Prophylactic therapy-anticoagulation  7. Risk factor modification 8. Telemetry monitoring 9. PT consult, OT consult, Speech consult 10. please page stroke NP  Or  PA  Or MD  M-F from 8am -4 pm starting 2/15 as this patient will be followed by the stroke team at this point.   You can look them up on [email or web address]  Password [email or web address]    Ritta Slot, MD [facility] [phone]  If 7pm- 7am, please page neurology on call as listed in AMION.

## 2015-02-22 NOTE — Progress Notes (Signed)
[facility] PROGRESS NOTE  [name] JFT:[id number] DOB: Oct 15, 1945 DOA: 02/21/2015 PCP: Shade Flood, MD  HPI/Brief narrative Please review h and p from 2/14 for details. Briefly, 70 year old female with a past medical history significant for HTN, HLD, s/p MVR ( St. Jude mechanical valve s/p thrombosis/cardiac arrest and revision ), valvular endocarditis in 2000, and combined systolic and diastolic CHF(last echo in 12/2014 EF 25-30% with grade 3 diastolic dysfunction); who presents with complaints of confusion for the past 3 days prior to admission. Patient was found to have subtherapeutic INR on admit  Assessment/Plan: Right basal ganglia infarct: Subacute. Exact cause of infarct unknown at this time and question question the possibility of underlying - Consulted Neurology - 2d echocardiogram pending - Bilateral carotid Dopplers with findings suggestive of L subclavian steal, follow neuro recs - PT/OT/speech to eval and treat - MRI/MRA ordered, pending. Pt noted to have prosthetic valve from prior documentation  S/p mechanical valve replacement on chronic anticoagulation therapy with prosthetic valve replacement in 2016: Patient is on chronic anticoagulation therapy, presented subtherapeutic. - Started on heparin drip, since stopped given therapeutic INR - Pharmacy to dose Coumadin - Goal INR 2.0-3.0 for afib  Chronic kidney disease stage 4: Patient at her baseline with a creatinine that varies between 2 and 3. Patient denies having a nephrologist in which she sees on a regular basis. - Continue to monitor - May want to set up patient with a nephrologist as an outpatient  Hypokalemia: Initial potassium 3.0  - replaced  Mild hypercalcemia: Calcium 10.8 initial blood work - Continue to monitor, pursue further workup if warranted  Afib - Rate controlled at present - Continue coumadin as per above  Code Status: Full Family Communication: Pt in room, family  at bedside Disposition Plan: Possible d/c in 24-48hrs   Consultants:  Neurology  Procedures:    Antibiotics: Anti-infectives    None      HPI/Subjective: No complaints today  Objective: Filed Vitals:   02/22/15 0835 02/22/15 1024 02/22/15 1208 02/22/15 1800  BP: 104/70 103/70 107/74 126/71  Pulse: 65 59 59 69  Temp: 97.7 F (36.5 C) 98 F (36.7 C) 98.2 F (36.8 C) 98.1 F (36.7 C)  TempSrc: Oral Oral Oral Oral  Resp: 16 16 16 16   SpO2: 100% 100% 100% 98%    Intake/Output Summary (Last 24 hours) at 02/22/15 1836 Last data filed at 02/22/15 1318  Gross per 24 hour  Intake    803 ml  Output      0 ml  Net    803 ml   There were no vitals filed for this visit.  Exam:   General:  Awake, in nad  Cardiovascular: regular, s1, s2  Respiratory: normal resp effort, no wheezing  Abdomen: soft,nondistended  Musculoskeletal: perfused, no clubbing   Data Reviewed: Basic Metabolic Panel:  Recent Labs Lab 02/21/15 1454 02/21/15 1838  NA 139 138  K 3.5 3.0*  CL 101 100*  CO2 29 28  GLUCOSE 76 141*  BUN 25 26*  CREATININE 1.99* 2.03*  CALCIUM 10.4 10.8*   Liver Function Tests:  Recent Labs Lab 02/21/15 1454 02/21/15 1838  AST 21 29  ALT 14 18  ALKPHOS 60 62  BILITOT 1.8* 2.0*  PROT 7.4 8.5*  ALBUMIN 3.8 4.4   No results for input(s): LIPASE, AMYLASE in the last 168 hours. No results for input(s): AMMONIA in the last 168 hours. CBC:  Recent Labs Lab 02/21/15 1510 02/21/15 1838  WBC 3.3*  3.8*  NEUTROABS  --  1.2*  HGB 12.5 13.3  HCT 37.9 41.0  MCV 84.2 86.9  PLT  --  217   Cardiac Enzymes: No results for input(s): CKTOTAL, CKMB, CKMBINDEX, TROPONINI in the last 168 hours. BNP (last 3 results)  Recent Labs  07/21/14 1436  BNP 2554.2*    ProBNP (last 3 results) No results for input(s): PROBNP in the last 8760 hours.  CBG: No results for input(s): GLUCAP in the last 168 hours.  Recent Results (from the past 240 hour(s))   Blood culture (routine x 2)     Status: None (Preliminary result)   Collection Time: 02/21/15  8:15 PM  Result Value Ref Range Status   Specimen Description BLOOD LEFT ANTECUBITAL  Final   Special Requests BOTTLES DRAWN AEROBIC AND ANAEROBIC 5CC EACH  Final   Culture   Final    NO GROWTH < 24 HOURS Performed at [facility]    Report Status PENDING  Incomplete  Blood culture (routine x 2)     Status: None (Preliminary result)   Collection Time: 02/21/15  8:20 PM  Result Value Ref Range Status   Specimen Description BLOOD RIGHT ANTECUBITAL  Final   Special Requests BOTTLES DRAWN AEROBIC AND ANAEROBIC 5CC EACH  Final   Culture   Final    NO GROWTH < 24 HOURS Performed at [facility]    Report Status PENDING  Incomplete     Studies: Ct Head Wo Contrast  02/21/2015  CLINICAL DATA:  Memory loss, altered mental status, dizziness EXAM: CT HEAD WITHOUT CONTRAST TECHNIQUE: Contiguous axial images were obtained from the base of the skull through the vertex without intravenous contrast. COMPARISON:  MRI 01/17/2007 FINDINGS: Encephalomalacia within the right cerebellar hemisphere from old infarct. There is low-density within the right basal ganglia which is compatible with infarct, age indeterminate a concerning for acute to subacute infarct. No hemorrhage. No hydrocephalus or midline shift. No acute calvarial abnormality. IMPRESSION: Low-density within the right basal ganglia compatible with age-indeterminate infarct. This is suspicious for acute to subacute infarct. 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 Electronically Signed   By: Charlett Nose M.D.   On: 02/21/2015 16:32    Scheduled Meds: . amiodarone  100 mg Oral Daily  . carvedilol  3.125 mg Oral BID WC  . furosemide  10 mg Oral Daily  . hydrALAZINE  25 mg Oral 3 times per day  . isosorbide mononitrate  30 mg Oral Daily  . pantoprazole  40 mg Oral Daily  . simvastatin  40 mg Oral Daily  . Warfarin -  Pharmacist Dosing Inpatient   Does not apply q1800   Continuous Infusions:   Principal Problem:   Right basal ganglia embolic stroke (HCC) Active Problems:   S/P MVR (mitral valve replacement)   Chronic kidney disease (CKD), stage IV (severe) (HCC)   Hypokalemia   Hypercalcemia   Subtherapeutic international normalized ratio (INR)   [name] K  [facility] Pager [phone]. If 7PM-7AM, please contact night-coverage at [email or web address], password [facility] 02/22/2015, 6:36 PM  LOS: 1 day

## 2015-02-22 NOTE — Progress Notes (Signed)
  Echocardiogram 2D Echocardiogram has been performed.  Cathie Beams 02/22/2015, 9:27 AM

## 2015-02-22 NOTE — Evaluation (Addendum)
Occupational Therapy Evaluation Patient Details Name: [name] MRN: [id number] DOB: 07-Dec-1945 Today's Date: 02/22/2015    History of Present Illness 70 year old female with a past medical history significant for HTN, HLD, s/p MVR ( St. Jude mechanical valve s/p thrombosis and revision), valvular endocarditis in 2000, and combined systolic and diastolic CHF(last echo in 12/2014 EF 25-30% with grade 3 diastolic dysfunction). Pt admitted for confusion/memory loss. Test revealed Low-density within the right basal ganglia compatible with age-indeterminate infarct. This is suspicious for acute to subacute infarct.   Clinical Impression   Pt admitted with above. Pt presents with decreased memory and decreased higher level balance. Feel pt will benefit from acute OT to increase independence prior to d/c.     Follow Up Recommendations  No OT follow up;Supervision/Assistance - 24 hour    Equipment Recommendations    3 in 1 beside commode   Recommendations for Other Services       Precautions / Restrictions Precautions Precautions: Fall Restrictions Weight Bearing Restrictions: No      Mobility Bed Mobility Overal bed mobility: Needs Assistance Bed Mobility: Supine to Sit;Sit to Supine     Supine to sit: Supervision Sit to supine: Supervision      Transfers Overall transfer level: Needs assistance   Transfers: Sit to/from Stand Sit to Stand: Supervision              Balance      LOB when simulating functional task while standing with single leg stance-assist given.                                       ADL Overall ADL's : Needs assistance/impaired             Lower Body Bathing: Supervison/ safety;Set up;Sit to/from stand Lower Body Bathing Details (indicate cue type and reason): simulated standing in single leg stance and pt with LOB-Min assist. Less assist would be required for performing LB bathing of legs/feet in sitting and  washing bottom in standing.     Lower Body Dressing: Supervision/safety;Sit to/from stand;Set up   Toilet Transfer: Supervision/safety;Ambulation (sit to stand from bed)           Functional mobility during ADLs: Supervision/safety General ADL Comments: Educated on options for shower chair-recommended sitting for LB bathing. Educated on strategies to help with decreased memory (calendar, alarm, writing things down). OT explained reasoning for recommending someone be with her 24/7.     Vision Pt reports no vision changes from baseline; wears reading glasses Vision Assessment?: Yes Tracking/Visual Pursuits: Able to track stimulus in all quads without difficulty Visual Fields: No apparent deficits   Perception     Praxis      Pertinent Vitals/Pain Pain Assessment: No/denies pain     Hand Dominance     Extremity/Trunk Assessment Upper Extremity Assessment Upper Extremity Assessment: Generalized weakness (weakness in bilateral shoulder flexors)   Lower Extremity Assessment Lower Extremity Assessment: Defer to PT evaluation       Communication Communication Communication: No difficulties   Cognition Arousal/Alertness: Awake/alert Behavior During Therapy: WFL for tasks assessed/performed Overall Cognitive Status: Impaired/Different from baseline Area of Impairment: Memory;Safety/judgement; Orientation Orientation: not oriented to time     Memory: Decreased short-term memory   Safety/Judgement: Decreased awareness of safety;Decreased awareness of deficits         General Comments       Exercises  Shoulder Instructions      Home Living Family/patient expects to be discharged to:: Private residence Living Arrangements: Alone Available Help at Discharge: Family Type of Home: Apartment Home Access: Level entry     Home Layout: One level     Bathroom Shower/Tub: IT trainer: Standard     Home Equipment: None           Prior Functioning/Environment Level of Independence: Needs assistance    ADL's / Homemaking Assistance Needed: assist with managing medications   Comments: retired from [employer]    OT Diagnosis: Generalized weakness; Cognitive Deficit   OT Problem List: Decreased strength;Impaired balance (sitting and/or standing);Decreased cognition;Decreased safety awareness;Decreased knowledge of use of DME or AE   OT Treatment/Interventions: Self-care/ADL training;Therapeutic exercise;DME and/or AE instruction;Patient/family education;Cognitive remediation/compensation;Therapeutic activities;Balance training    OT Goals(Current goals can be found in the care plan section) Acute Rehab OT Goals Patient Stated Goal: not stated OT Goal Formulation: With patient/family Time For Goal Achievement: 03/01/15 Potential to Achieve Goals: Good ADL Goals Pt Will Perform Tub/Shower Transfer: Tub transfer;with supervision;ambulating;3 in 1;with set-up Additional ADL Goal #1: Pt/family will verbalize understanding of strategies to help with memory. Additional ADL Goal #2: Pt will gather ADL items from low drawer at Mod I level. Additional ADL Goal #3: Pt will independently perform HEP for bilateral UEs to increase strength.  OT Frequency: Min 2X/week   Barriers to D/C:            Co-evaluation              End of Session Nurse Communication: Other (comment) (bed alarm not set; decreased memory)  Activity Tolerance: Patient tolerated treatment well Patient left: in bed;with call bell/phone within reach;with family/visitor present   Time: 1110-1131 OT Time Calculation (min): 21 min Charges:  OT General Charges $OT Visit: 1 Procedure OT Evaluation $OT Eval Moderate Complexity: 1 Procedure G-CodesEarlie Raveling OTR/L [phone] 02/22/2015, 2:03 PM

## 2015-02-22 NOTE — Progress Notes (Addendum)
ANTICOAGULATION CONSULT NOTE - Initial Consult  Pharmacy Consult for Warfarin/Heparin  Indication: Bioprosthetic Mitral Valve  Vital Signs: Temp: 98.1 F (36.7 C) (02/15 0037) Temp Source: Oral (02/15 0037) BP: 116/89 mmHg (02/15 0037) Pulse Rate: 56 (02/15 0037)  Labs:  Recent Labs  02/21/15 1454 02/21/15 1510 02/21/15 1838 02/21/15 2034  HGB  --  12.5 13.3  --   HCT  --  37.9 41.0  --   PLT  --   --  217  --   APTT  --   --   --  33  LABPROT  --   --  19.5*  --   INR  --   --  1.65*  --   CREATININE 1.99*  --  2.03*  --     Estimated Creatinine Clearance: 24.2 mL/min (by C-G formula based on Cr of 2.03).   Medical History: Past Medical History  Diagnosis Date  . Hypertension   . Hyperlipidemia   . S/P MVR (mitral valve replacement) 04/11/1998    #29 St. Jude bileaflet mechanical valve for bacterial endocarditis  . 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 S/P redo mitral valve replacement with bioprosthetic valve 07/25/2014    27 mm [facility] Mitral bovine bioprosthetic tissue valve  . Acute CHF (congestive heart failure) (HCC) 12/14/2014    Assessment: 70 y/o F tx from [facility], heparin ordered at [facility] due to sub-therapeutic INR of 1.65, CBC good, noted renal dysfunction.   Warfarin PTA dose is 1 mg on Mon/Fri and 2 mg all other days, last dose 2/13  Goal of Therapy:  INR 2-3 per outpatient anti-coag notes Heparin level 0.3-0.5 units/mL (possible small infarct on CT) Monitor platelets by anticoagulation protocol: Yes   Plan:  -Start heparin at 750 units/hr (doesn't appear to have been started at [facility]) -0900 HL -Daily CBC/HL  -Warfarin 3 mg PO X 1 now -Daily PT/INR -Monitor for bleeding  Abran Duke 02/22/2015,12:47 AM

## 2015-02-22 NOTE — Evaluation (Signed)
Physical Therapy Evaluation Patient Details Name: [name] MRN: [id number] DOB: 1945-10-16 Today's Date: 02/22/2015   History of Present Illness  70 year old female with a past medical history significant for HTN, HLD, s/p MVR ( St. Jude mechanical valve s/p thrombosis and revision), valvular endocarditis in 2000, and combined systolic and diastolic CHF(last echo in 12/2014 EF 25-30% with grade 3 diastolic dysfunction). Pt admitted for confusion/memory loss. Test revealed Low-density within the right basal ganglia compatible with age-indeterminate infarct. This is suspicious for acute to subacute infarct.  Clinical Impression  Pt admitted with above diagnosis. Pt currently with functional limitations due to the deficits listed below (see PT Problem List).  Pt will benefit from skilled PT to increase their independence and safety with mobility to allow discharge to the venue listed below.  Pt with no LOB during PT session with gait in hallway with head turns/nods start/stops.  Pt needs 24/7 S due to decreased memory/cognitive issues.    Follow Up Recommendations No PT follow up;Supervision/Assistance - 24 hour    Equipment Recommendations  None recommended by PT    Recommendations for Other Services       Precautions / Restrictions Precautions Precautions: Fall Restrictions Weight Bearing Restrictions: No      Mobility  Bed Mobility Overal bed mobility: Modified Independent Bed Mobility: Supine to Sit;Sit to Supine     Supine to sit: Supervision Sit to supine: Supervision      Transfers Overall transfer level: Needs assistance Equipment used: None Transfers: Sit to/from Stand Sit to Stand: Supervision            Ambulation/Gait Ambulation/Gait assistance: Min guard;Supervision Ambulation Distance (Feet): 200 Feet Assistive device: None Gait Pattern/deviations: Step-through pattern Gait velocity: decreased   General Gait Details: performed head  turns, head nods, start/stops with no LOB. Min/guard initially and progressed to S.  Stairs            Wheelchair Mobility    Modified Rankin (Stroke Patients Only) Modified Rankin (Stroke Patients Only) Pre-Morbid Rankin Score: No symptoms Modified Rankin: No significant disability     Balance Overall balance assessment: Needs assistance   Sitting balance-Leahy Scale: Normal       Standing balance-Leahy Scale: Good                               Pertinent Vitals/Pain Pain Assessment: No/denies pain    Home Living Family/patient expects to be discharged to:: Private residence Living Arrangements: Alone Available Help at Discharge: Family Type of Home: Apartment Home Access: Level entry     Home Layout: One level Home Equipment: None      Prior Function Level of Independence: Needs assistance      ADL's / Homemaking Assistance Needed: assist with managing medications  Comments: retired from [employer]        Extremity/Trunk Assessment   Upper Extremity Assessment: Defer to OT evaluation           Lower Extremity Assessment: Overall WFL for tasks assessed         Communication   Communication: No difficulties  Cognition Arousal/Alertness: Awake/alert Behavior During Therapy: WFL for tasks assessed/performed Overall Cognitive Status: Impaired/Different from baseline Area of Impairment: Orientation Orientation Level: Person   Memory: Decreased short-term memory   Safety/Judgement: Decreased awareness of deficits;Decreased awareness of safety     General Comments: Pt really not aware of why she is in  the hospital. Could not tell the month even when given clue of Valentine's day.  She could say year was 17, but could not come up with full year. Pt states that she has been having trouble with memory since her last surgery.    General Comments      Exercises        Assessment/Plan    PT Assessment Patient  needs continued PT services  PT Diagnosis Difficulty walking   PT Problem List Decreased balance;Decreased mobility;Decreased cognition  PT Treatment Interventions Gait training;Functional mobility training;Therapeutic activities;Therapeutic exercise;Stair training;Balance training   PT Goals (Current goals can be found in the Care Plan section) Acute Rehab PT Goals Patient Stated Goal: none state PT Goal Formulation: With patient Time For Goal Achievement: 03/01/15 Potential to Achieve Goals: Good    Frequency Min 4X/week   Barriers to discharge Decreased caregiver support lives alone    Co-evaluation               End of Session Equipment Utilized During Treatment: Gait belt Activity Tolerance: Patient tolerated treatment well Patient left: in chair;with family/visitor present (with MD and NP present) Nurse Communication: Mobility status         Time: 1610-9604 PT Time Calculation (min) (ACUTE ONLY): 18 min   Charges:   PT Evaluation $PT Eval Moderate Complexity: 1 Procedure     PT G Codes:        [name] 02/22/2015, 2:20 PM

## 2015-02-22 NOTE — Progress Notes (Signed)
ANTICOAGULATION CONSULT NOTE - Follow Up Consult  Pharmacy Consult for heparin and warfarin  Indication: bioprosthetic heart valve replacement in July 2016 and chronic afib   Vital Signs: Temp: 98.2 F (36.8 C) (02/15 1208) Temp Source: Oral (02/15 1208) BP: 107/74 mmHg (02/15 1208) Pulse Rate: 59 (02/15 1208)  Labs:  Recent Labs  02/21/15 1454 02/21/15 1510 02/21/15 1838 02/21/15 2034 02/22/15 0953  HGB  --  12.5 13.3  --   --   HCT  --  37.9 41.0  --   --   PLT  --   --  217  --   --   APTT  --   --   --  33  --   LABPROT  --   --  19.5*  --  23.1*  INR  --   --  1.65*  --  2.06*  HEPARINUNFRC  --   --   --   --  0.32  CREATININE 1.99*  --  2.03*  --   --     Assessment: 89 yoF with PMHx for bioprosthetic mitral valve replacement/redo in 07/2014 and chronic afib on coumadin PTA that was admitted on 2/14 with AMS and found to have subcortical stroke on the right. INR on admission 1.65. Neurology felt the stroke was small and that the benefits out weigh the risk for anticoagulation vs conversion. Heparin was subsequently started and coumadin resumed. INR today is 2.06 after one 3 mg dose of coumadin and remains on heparin gtt with heparin level of 0.32. Most recent CBC stable with no sxs of bleeding.  PTA dose of coumadin: 1 mg on Mon/Fri and 2 mg all other days, last dose 2/13  Goal of Therapy:  INR 2-3 Heparin level 0.3-0.7 units/ml Monitor platelets by anticoagulation protocol: Yes   Plan:  1. Discontinue heparin drip with therapeutic INR; discussed with stroke team 2. Coumadin 2 mg x 1 today 3. Daily INR and CBC 4. Following along with you daily   Pollyann Samples, PharmD, BCPS 02/22/2015, 12:35 PM Pager: [phone]

## 2015-02-22 NOTE — Care Management Note (Signed)
Case Management Note  Patient Details  Name: [name] MRN: [id number] Date of Birth: [birth year]  Subjective/Objective:    Patient admitted to r/o CVA. Patient is from home alone.                 Action/Plan: Awaiting further testing and PT/OT recommendations. CM following for discharge disposition.   Expected Discharge Date:   (unknown)               Expected Discharge Plan:     In-House Referral:     Discharge planning Services     Post Acute Care Choice:    Choice offered to:     DME Arranged:    DME Agency:     HH Arranged:    HH Agency:     Status of Service:  In process, will continue to follow  Medicare Important Message Given:    Date Medicare IM Given:    Medicare IM give by:    Date Additional Medicare IM Given:    Additional Medicare Important Message give by:     If discussed at Long Length of Stay Meetings, dates discussed:    Additional Comments:  Kermit Balo, RN 02/22/2015, 2:28 PM

## 2015-02-22 NOTE — Progress Notes (Signed)
STROKE TEAM PROGRESS NOTE   HISTORY OF PRESENT ILLNESS [name] is a 70 y.o. female with a history of bioprosthetic heart valve who is on coumadin for atrial fibrillation, recently had her Coumadin dose decreased. On Sunday, she was noted to be confused and since then the family has noticed that they have had to remind her frequently of the same thing. She also became lost recently, which is not something that is ever happened to her before. Due to concerns about her memory loss, they took her to her outpatient physician today who performed a head CT which shows concern for stroke on the right including the head of caudate. She was [name] Sunday 02/19/2015. Patient was not administered TPA secondary to delay in arrival. She was admitted for further evaluation and treatment.   SUBJECTIVE (INTERVAL HISTORY) Her daughter cares for her at home (she is not present) . PT is at the bedside.  Overall she feels her condition is stable and improving. Hs is having some memory difficulties per PT - cannot remember that Valentine's day is in Feb. Per notes, she was having memory difficulties in 10.2016 - i did not go back further than that. Denies any LOC, seizure, weakness or numbness.    OBJECTIVE Temp:  [97.5 F (36.4 C)-98.8 F (37.1 C)] 98.2 F (36.8 C) (02/15 1208) Pulse Rate:  [50-108] 59 (02/15 1208) Cardiac Rhythm:  [-] Sinus bradycardia;Heart block (02/15 0702) Resp:  [11-18] 16 (02/15 1208) BP: (88-142)/(69-89) 107/74 mmHg (02/15 1208) SpO2:  [95 %-100 %] 100 % (02/15 1208) Weight:  [61.689 kg (136 lb)] 61.689 kg (136 lb) (02/14 1400)  CBC:   Recent Labs Lab 02/21/15 1510 02/21/15 1838  WBC 3.3* 3.8*  NEUTROABS  --  1.2*  HGB 12.5 13.3  HCT 37.9 41.0  MCV 84.2 86.9  PLT  --  217    Basic Metabolic Panel:   Recent Labs Lab 02/21/15 1454 02/21/15 1838  NA 139 138  K 3.5 3.0*  CL 101 100*  CO2 29 28  GLUCOSE 76 141*  BUN 25 26*  CREATININE 1.99* 2.03*  CALCIUM  10.4 10.8*    Lipid Panel:     Component Value Date/Time   CHOL 181 02/22/2015 0707   TRIG 33 02/22/2015 0707   HDL 67 02/22/2015 0707   CHOLHDL 2.7 02/22/2015 0707   VLDL 7 02/22/2015 0707   LDLCALC 107* 02/22/2015 0707   HgbA1c:  Lab Results  Component Value Date   HGBA1C  01/13/2007    5.8 (NOTE)   The ADA recommends the following therapeutic goals for glycemic   control related to Hgb A1C measurement:   Goal of Therapy:   < 7.0% Hgb A1C   Action Suggested:  > 8.0% Hgb A1C   Ref:  Diabetes Care, 22, Suppl. 1, 1999   Urine Drug Screen:     Component Value Date/Time   LABOPIA NONE DETECTED 02/21/2015 2222   COCAINSCRNUR NONE DETECTED 02/21/2015 2222   LABBENZ NONE DETECTED 02/21/2015 2222   AMPHETMU NONE DETECTED 02/21/2015 2222   THCU NONE DETECTED 02/21/2015 2222   LABBARB NONE DETECTED 02/21/2015 2222      IMAGING I have personally reviewed the radiological images below and agree with the radiology interpretations.  Ct Head Wo Contrast 02/21/2015   Low-density within the right basal ganglia compatible with age-indeterminate infarct. This is suspicious for acute to subacute infarct. 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   Carotid Doppler   Bilateral: 1-39% ICA stenosis.  Right: Vertebral artery flow is antegrade. Left: Vertebral artery retrograde suggesting left subclavian steal.    2D Echocardiogram  - Left ventricle: The cavity size was normal. Systolic function wasnormal. The estimated ejection fraction was in the range of 50%to 55%. Doppler parameters are consistent with abnormal leftventricular relaxation (grade 1 diastolic dysfunction). - Aortic valve: There was mild regurgitation. - Left atrium: The atrium was normal in size. - Right ventricle: The cavity size was mildly dilated. Wallthickness was normal. Systolic function was moderately reduced. - Tricuspid valve: There was moderate regurgitation. - Pulmonic valve: There was moderate  regurgitation. - Pulmonary arteries: Systolic pressure was mildly increased. PApeak pressure: 41 mm Hg (S). - Pericardium, extracardiac: There was no pericardial effusion. Impressions:  - When compared to the prior study from 12/14/2014 LVEF has improvedfom 25-30% to low normal 50-55%.RVEF is moderate ly reduced. There is mild pulmonaryhypertension.  MRI / MRA pending   Physical exam  Temp:  [97.5 F (36.4 C)-99 F (37.2 C)] 99 F (37.2 C) (02/15 2107) Pulse Rate:  [50-69] 62 (02/15 2107) Resp:  [11-18] 18 (02/15 2107) BP: (100-126)/(70-89) 124/72 mmHg (02/15 2107) SpO2:  [95 %-100 %] 99 % (02/15 2107)  General - Well nourished, well developed, in no apparent distress.  Ophthalmologic - Fundi not visualized due to noncooperation.  Cardiovascular - Regular rate and rhythm with diastolic murmur.  Mental Status -  Level of arousal and orientation to time, place, and person were intact. Language including expression, naming, repetition, comprehension was assessed and found intact. Recent and remote memory were 3/3 registration, 1/3 delayed recall, with cue 3/3. Fund of Knowledge was assessed and was intact.  Cranial Nerves II - XII - II - Visual field intact OU. III, IV, VI - Extraocular movements intact. V - Facial sensation intact bilaterally. VII - Facial movement intact bilaterally. VIII - Hearing & vestibular intact bilaterally. X - Palate elevates symmetrically. XI - Chin turning & shoulder shrug intact bilaterally. XII - Tongue protrusion intact.  Motor Strength - The patient's strength was normal in all extremities and pronator drift was absent.  Bulk was normal and fasciculations were absent.   Motor Tone - Muscle tone was assessed at the neck and appendages and was normal.  Reflexes - The patient's reflexes were 1+ in all extremities and she had no pathological reflexes.  Sensory - Light touch, temperature/pinprick were assessed and were symmetrical.     Coordination - The patient had normal movements in the hands with no ataxia or dysmetria.  Tremor was absent.  Gait and Station - not tested as pt is eating lunch.   ASSESSMENT/PLAN [name] is a 70 y.o. female with history of bioprosthetic heart valve, atrial fibrillation on coumadin with increased confusion. She did not receive IV t-PA due to delay in arrival.   Probable acute right BG infarct, workup underway  Resultant  Worsening of baseline confusion  MRI  pending   MRA  pending   Carotid Doppler  Questionable L subclavian steal syndrome  2D Echo Improved EF 25-30% to low normal 50-55% since last echo  LDL 107  HgbA1c pending  Warfarin for VTE prophylaxis Diet Heart Room service appropriate?: Yes; Fluid consistency:: Thin  warfarin daily prior to admission, now on heparin IV and warfarin daily. INR therapeutic today. D/c heparin. INR goal 2-3  Patient counseled to be compliant with her antithrombotic medications  Ongoing aggressive stroke risk factor management  Therapy recommendations:  No therapy needs  Disposition:  Return  home (daughter helps provide care)  Atrial Fibrillation  Home anticoagulation:  warfarin daily, placed on IV heparin and continued in the hospital  INR 1.65 on admission. Today INR 2.06.  Pharmacy following, as goal is within 2.0-3.0, will discontinue IV heparin drip and continue warfarin  Likely Subclavian Steal Syndrome  Asymptomatic  Follow up as outpt with [facility]  Hypertension  Stable   Permissive hypertension (OK if < 220/120) but gradually normalize in 5-7 days  Hyperlipidemia  Home meds:  zocor 40, resumed in hospital  LDL 107, goal < 70  Continue statin at discharge  Other Stroke Risk Factors  Advanced age  Former Cigarette smoker, quit smoking 42 years ago  Other Active Problems  Hx MVR with [employer] Jude mechanical valve for bacterial endocarditis 2000 with trombosis of heart valve 2016 s/p  redo with bioprosthetic valve  Chronic kidney disease stage 4 with AKI x 1 mo, trending down  Hypokalemia, 3.0->  Mild hyperclcemia  Hospital day # 1  Marvel Plan, MD PhD Stroke Neurology 02/22/2015 10:18 PM     To contact Stroke Continuity provider, please refer to [email or web address]. After hours, contact General Neurology

## 2015-02-22 NOTE — Progress Notes (Signed)
VASCULAR LAB PRELIMINARY  PRELIMINARY  PRELIMINARY  PRELIMINARY  Carotid duplex  completed.    Preliminary report:  Bilateral: 1-39% ICA stenosis. Right:  Vertebral artery flow is antegrade.  Left:  Vertebral artery retrograde suggesting left subclavian steal.     Alyssamae Klinck, RVT 02/22/2015, 12:32 PM

## 2015-02-22 NOTE — Progress Notes (Signed)
Nutrition Brief Note  Patient identified on the Malnutrition Screening Tool (MST) Report  Wt Readings from Last 15 Encounters:  02/21/15 136 lb (61.689 kg)  02/06/15 136 lb 8 oz (61.916 kg)  01/18/15 136 lb 8 oz (61.916 kg)  01/04/15 141 lb 8 oz (64.184 kg)  12/17/14 139 lb 8 oz (63.277 kg)  12/13/14 151 lb (68.493 kg)  11/07/14 148 lb (67.132 kg)  09/22/14 145 lb 4.8 oz (65.908 kg)  08/29/14 146 lb (66.225 kg)  08/10/14 146 lb 13.2 oz (66.6 kg)  08/03/14 158 lb 1.6 oz (71.714 kg)  03/11/14 151 lb 8 oz (68.72 kg)  01/21/13 164 lb 1.6 oz (74.435 kg)    Body Mass Index of 21.9 kg/(m^2). Patient meets criteria for Normal Weight based on current BMI.   Current diet order is Heart Healthy, patient is consuming approximately 75-100% of meals at this time. Pt reports losing from 160 lbs to 134 lbs in the past year (15% weight loss) due to eating healthier and being on Lasix. She reports eating more fruits, vegetables, and baked fish. She states that her appetite is good and she was eating 3 meals daily PTA. She denies any nutrition related questions or concerns at this time. Labs and medications reviewed. RD encouraged pt to monitor and maintain weight. Encouraged general healthful PO intake with a variety of foods daily.   No nutrition interventions warranted at this time. If nutrition issues arise, please consult RD.   Dorothea Ogle RD, LDN Inpatient Clinical Dietitian Pager: [phone] After Hours Pager: [phone]

## 2015-02-23 DIAGNOSIS — I679 Cerebrovascular disease, unspecified: Secondary | ICD-10-CM | POA: Insufficient documentation

## 2015-02-23 DIAGNOSIS — E785 Hyperlipidemia, unspecified: Secondary | ICD-10-CM | POA: Insufficient documentation

## 2015-02-23 DIAGNOSIS — I639 Cerebral infarction, unspecified: Secondary | ICD-10-CM

## 2015-02-23 DIAGNOSIS — I63311 Cerebral infarction due to thrombosis of right middle cerebral artery: Secondary | ICD-10-CM

## 2015-02-23 LAB — CBC WITH DIFFERENTIAL/PLATELET
BASOS ABS: 0.1 10*3/uL (ref 0.0–0.1)
Basophils Relative: 2 %
EOS ABS: 0.2 10*3/uL (ref 0.0–0.7)
EOS PCT: 7 %
HCT: 34.9 % — ABNORMAL LOW (ref 36.0–46.0)
HEMOGLOBIN: 11.5 g/dL — AB (ref 12.0–15.0)
Lymphocytes Relative: 36 %
Lymphs Abs: 1.1 10*3/uL (ref 0.7–4.0)
MCH: 28.3 pg (ref 26.0–34.0)
MCHC: 33 g/dL (ref 30.0–36.0)
MCV: 86 fL (ref 78.0–100.0)
Monocytes Absolute: 0.4 10*3/uL (ref 0.1–1.0)
Monocytes Relative: 12 %
NEUTROS PCT: 43 %
Neutro Abs: 1.3 10*3/uL — ABNORMAL LOW (ref 1.7–7.7)
PLATELETS: 149 10*3/uL — AB (ref 150–400)
RBC: 4.06 MIL/uL (ref 3.87–5.11)
RDW: 15 % (ref 11.5–15.5)
WBC: 2.9 10*3/uL — AB (ref 4.0–10.5)

## 2015-02-23 LAB — BASIC METABOLIC PANEL
ANION GAP: 9 (ref 5–15)
BUN: 27 mg/dL — ABNORMAL HIGH (ref 6–20)
CO2: 27 mmol/L (ref 22–32)
Calcium: 9.8 mg/dL (ref 8.9–10.3)
Chloride: 105 mmol/L (ref 101–111)
Creatinine, Ser: 2.04 mg/dL — ABNORMAL HIGH (ref 0.44–1.00)
GFR, EST AFRICAN AMERICAN: 28 mL/min — AB (ref >60)
GFR, EST NON AFRICAN AMERICAN: 24 mL/min — AB (ref >60)
Glucose, Bld: 106 mg/dL — ABNORMAL HIGH (ref 65–99)
POTASSIUM: 3.8 mmol/L (ref 3.5–5.1)
SODIUM: 141 mmol/L (ref 135–145)

## 2015-02-23 LAB — PROTIME-INR
INR: 2.78 — AB (ref 0.00–1.49)
Prothrombin Time: 28.9 seconds — ABNORMAL HIGH (ref 11.6–15.2)

## 2015-02-23 LAB — HEMOGLOBIN A1C
HEMOGLOBIN A1C: 5.8 % — AB (ref 4.8–5.6)
MEAN PLASMA GLUCOSE: 120 mg/dL

## 2015-02-23 MED ORDER — ASPIRIN 325 MG PO TABS
325.0000 mg | ORAL_TABLET | Freq: Every day | ORAL | Status: DC
  Administered 2015-02-23 – 2015-02-24 (×2): 325 mg via ORAL
  Filled 2015-02-23 (×2): qty 1

## 2015-02-23 MED ORDER — WARFARIN SODIUM 1 MG PO TABS
1.0000 mg | ORAL_TABLET | Freq: Once | ORAL | Status: AC
  Administered 2015-02-23: 1 mg via ORAL
  Filled 2015-02-23: qty 1

## 2015-02-23 NOTE — Progress Notes (Addendum)
STROKE TEAM PROGRESS NOTE   SUBJECTIVE (INTERVAL HISTORY) Her brother is at the bedside.  Overall she feels her condition is stable and improving. Her MRA showed significant posterior vessel stenosis/occlusion and MRI consistent with embolic stroke. Will need coumadin plus ASA. Pt agrees.      OBJECTIVE Temp:  [98 F (36.7 C)-99 F (37.2 C)] 98.4 F (36.9 C) (02/16 1650) Pulse Rate:  [55-87] 62 (02/16 1650) Cardiac Rhythm:  [-] Sinus bradycardia (02/16 1205) Resp:  [16-18] 16 (02/16 1650) BP: (100-124)/(49-72) 115/55 mmHg (02/16 1650) SpO2:  [99 %-100 %] 99 % (02/16 1650)  CBC:   Recent Labs Lab 02/21/15 1838 02/23/15 0706  WBC 3.8* 2.9*  NEUTROABS 1.2* 1.3*  HGB 13.3 11.5*  HCT 41.0 34.9*  MCV 86.9 86.0  PLT 217 149*    Basic Metabolic Panel:   Recent Labs Lab 02/21/15 1838 02/23/15 0706  NA 138 141  K 3.0* 3.8  CL 100* 105  CO2 28 27  GLUCOSE 141* 106*  BUN 26* 27*  CREATININE 2.03* 2.04*  CALCIUM 10.8* 9.8    Lipid Panel:     Component Value Date/Time   CHOL 181 02/22/2015 0707   TRIG 33 02/22/2015 0707   HDL 67 02/22/2015 0707   CHOLHDL 2.7 02/22/2015 0707   VLDL 7 02/22/2015 0707   LDLCALC 107* 02/22/2015 0707   HgbA1c:  Lab Results  Component Value Date   HGBA1C 5.8* 02/22/2015   Urine Drug Screen:     Component Value Date/Time   LABOPIA NONE DETECTED 02/21/2015 2222   COCAINSCRNUR NONE DETECTED 02/21/2015 2222   LABBENZ NONE DETECTED 02/21/2015 2222   AMPHETMU NONE DETECTED 02/21/2015 2222   THCU NONE DETECTED 02/21/2015 2222   LABBARB NONE DETECTED 02/21/2015 2222      IMAGING I have personally reviewed the radiological images below and agree with the radiology interpretations.  Ct Head Wo Contrast 02/21/2015   Low-density within the right basal ganglia compatible with age-indeterminate infarct. This is suspicious for acute to subacute infarct. 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   Carotid Doppler   Bilateral:  1-39% ICA stenosis. Right: Vertebral artery flow is antegrade. Left: Vertebral artery retrograde suggesting left subclavian steal.    2D Echocardiogram  - Left ventricle: The cavity size was normal. Systolic function wasnormal. The estimated ejection fraction was in the range of 50%to 55%. Doppler parameters are consistent with abnormal leftventricular relaxation (grade 1 diastolic dysfunction). - Aortic valve: There was mild regurgitation. - Left atrium: The atrium was normal in size. - Right ventricle: The cavity size was mildly dilated. Wallthickness was normal. Systolic function was moderately reduced. - Tricuspid valve: There was moderate regurgitation. - Pulmonic valve: There was moderate regurgitation. - Pulmonary arteries: Systolic pressure was mildly increased. PApeak pressure: 41 mm Hg (S). - Pericardium, extracardiac: There was no pericardial effusion. Impressions:  - When compared to the prior study from 12/14/2014 LVEF has improvedfom 25-30% to low normal 50-55%.RVEF is moderate ly reduced. There is mild pulmonaryhypertension.  MRI and MRA head MRI HEAD: Acute RIGHT basal ganglia infarct. Multiple cerebellar infarcts, small RIGHT occipital lobe encephalomalacia/infarct. Old mesial LEFT thalamus lacunar infarct. Scattered susceptibility artifact in a distribution compatible with chronic hypertension. Mild chronic small vessel ischemic disease. MRA HEAD: Occluded LEFT vertebral artery, occluded basilar artery is which are likely chronic considering posterior circulation distribution old infarcts. Complete circle of Willis. Occluded LEFT posterior cerebral artery at P2 segment. High-grade stenosis RIGHT A1 segment, patent anterior communicating artery. Moderate stenosis RIGHT  M1 segment. General mild luminal regularity of the vessels compatible with atherosclerosis.    Physical exam  Temp:  [98 F (36.7 C)-99 F (37.2 C)] 98.4 F (36.9 C) (02/16 1650) Pulse Rate:   [55-87] 62 (02/16 1650) Resp:  [16-18] 16 (02/16 1650) BP: (100-124)/(49-72) 115/55 mmHg (02/16 1650) SpO2:  [99 %-100 %] 99 % (02/16 1650)  General - Well nourished, well developed, in no apparent distress.  Ophthalmologic - Fundi not visualized due to noncooperation.  Cardiovascular - Regular rate and rhythm with diastolic murmur.  Mental Status -  Level of arousal and orientation to time, place, and person were intact. Language including expression, naming, repetition, comprehension was assessed and found intact. Recent and remote memory were 3/3 registration, 1/3 delayed recall, with cue 3/3. Fund of Knowledge was assessed and was intact.  Cranial Nerves II - XII - II - Visual field intact OU. III, IV, VI - Extraocular movements intact. V - Facial sensation intact bilaterally. VII - Facial movement intact bilaterally. VIII - Hearing & vestibular intact bilaterally. X - Palate elevates symmetrically. XI - Chin turning & shoulder shrug intact bilaterally. XII - Tongue protrusion intact.  Motor Strength - The patient's strength was normal in all extremities and pronator drift was absent.  Bulk was normal and fasciculations were absent.   Motor Tone - Muscle tone was assessed at the neck and appendages and was normal.  Reflexes - The patient's reflexes were 1+ in all extremities and she had no pathological reflexes.  Sensory - Light touch, temperature/pinprick were assessed and were symmetrical.    Coordination - The patient had normal movements in the hands with no ataxia or dysmetria.  Tremor was absent.  Gait and Station - not tested as pt is eating lunch.   ASSESSMENT/PLAN [name] is a 70 y.o. female with history of bioprosthetic heart valve, atrial fibrillation on coumadin with increased confusion. She did not receive IV t-PA due to delay in arrival.   Stroke: right BG and caudate head infarcts, embolic pattern due to afib on coumadin with  subtherapeutic INR.   Resultant  Worsening of baseline confusion  MRI  Right BG and right caudate head infarcts, embolic pattern   MRA showed severe posterior athero including BA occlusion, left VA and left P2 occlusion.  Carotid Doppler  L subclavian steal syndrome  2D Echo Improved from EF 25-30% to low normal 50-55% since last echo  LDL 107  HgbA1c 5.8  Warfarin for VTE prophylaxis Diet Heart Room service appropriate?: Yes; Fluid consistency:: Thin  warfarin daily prior to admission, now on coumadin with INR goal 2-3. Due to severe intracranial atherosclerosis, we recommend to add ASA in addition to coumadin.  Patient counseled to be compliant with her antithrombotic medications  Ongoing aggressive stroke risk factor management  Therapy recommendations:  No therapy needs  Disposition:  Return home (daughter helps provide care)  Atrial Fibrillation  Home anticoagulation:  warfarin daily, placed on IV heparin and continued in the hospital  INR 1.65 on admission. Today INR 2.06->2.78.  INR goal 2.0-3.0.  Severe posterior athero with BA, left VA, left P2 occlusion and Subclavian Steal Syndrome  Asymptomatic  Recommend ASA in addition to coumadin.  BP goal 130-150 due to BA occlusion  Follow up as outpt with [facility] for left subclavian steal  Hypertension  Stable  BP goal 130--150 due to [facility] occlusion  Hyperlipidemia  Home meds:  zocor 40, resumed in hospital  LDL 107, goal < 70  Continue statin at discharge  Other Stroke Risk Factors  Advanced age  Former Cigarette smoker, quit smoking 42 years ago  Other Active Problems  Hx MVR with [employer] Jude mechanical valve for bacterial endocarditis 2000 with trombosis of heart valve 2016 s/p redo with bioprosthetic valve  Chronic kidney disease stage 4 with AKI x 1 mo, trending down  Hypokalemia, 3.0->  Mild hyperclcemia  Hospital day # 2   Neurology will sign off. Please call with questions. Pt will follow  up with Dr. Roda Shutters at [facility] in about 2 months. Thanks for the consult.   Marvel Plan, MD PhD Stroke Neurology 02/23/2015 8:07 PM     To contact Stroke Continuity provider, please refer to [email or web address]. After hours, contact General Neurology

## 2015-02-23 NOTE — Progress Notes (Signed)
[facility] PROGRESS NOTE  [name] NAT:[id number] DOB: 1945/12/09 DOA: 02/21/2015 PCP: Shade Flood, MD  HPI/Brief narrative Please review h and p from 2/14 for details. Briefly, 70 year old female with a past medical history significant for HTN, HLD, s/p MVR ( St. Jude mechanical valve s/p thrombosis/cardiac arrest and revision ), valvular endocarditis in 2000, and combined systolic and diastolic CHF(last echo in 12/2014 EF 25-30% with grade 3 diastolic dysfunction); who presents with complaints of confusion for the past 3 days prior to admission. Patient was found to have subtherapeutic INR on admit  Assessment/Plan: Right basal ganglia infarct: Subacute. Exact cause of infarct unknown at this time and question question the possibility of underlying - Consulted Neurology - 2d echocardiogram with improved EF from prior study from 25-30% to 50-55% - Bilateral carotid Dopplers with findings suggestive of L subclavian steal - PT/OT/speech to eval and treat - MRI/MRA with findings of multiple infarcts - discussed with Neurology. Recs to continue on coumadin plus 325mg  aspirin daily  S/p mechanical valve replacement on chronic anticoagulation therapy with prosthetic valve replacement in 2016: Patient is on chronic anticoagulation therapy, presented subtherapeutic. - Started on heparin drip, since stopped given therapeutic INR - Pharmacy to dose Coumadin - Goal INR 2.0-3.0 for afib  Chronic kidney disease stage 4: Patient at her baseline with a creatinine that varies between 2 and 3. Patient denies having a nephrologist in which she sees on a regular basis. - Continue to monitor - May want to set up patient with a nephrologist as an outpatient  Hypokalemia: Initial potassium 3.0  - replaced  Mild hypercalcemia: Calcium 10.8 initial blood work - Improved. Currently 9.8  Afib - Rate controlled at present - Continue coumadin as per above  Code Status:  Full Family Communication: Pt in room, family at bedside Disposition Plan: Possible d/c in 24hrs   Consultants:  Neurology  Procedures:    Antibiotics: Anti-infectives    None      HPI/Subjective: No complaints today. Eager to go home  Objective: Filed Vitals:   02/23/15 0217 02/23/15 0641 02/23/15 0959 02/23/15 1356  BP: 102/63 110/54 118/62 100/49  Pulse: 65 55 57 87  Temp: 98.6 F (37 C) 98.2 F (36.8 C) 98 F (36.7 C) 98.2 F (36.8 C)  TempSrc: Oral Oral Oral Oral  Resp: 18 18 16 16   Height:      Weight:      SpO2: 100% 100% 99% 100%    Intake/Output Summary (Last 24 hours) at 02/23/15 1628 Last data filed at 02/23/15 1002  Gross per 24 hour  Intake    240 ml  Output      0 ml  Net    240 ml   Filed Weights   02/22/15 0400  Weight: 61.689 kg (136 lb)    Exam:   General:  Awake, in nad, sitting in chair  Cardiovascular: regular, s1, s2  Respiratory: normal resp effort, no wheezing  Abdomen: soft,nondistended  Musculoskeletal: perfused, no clubbing, no cyanosis  Data Reviewed: Basic Metabolic Panel:  Recent Labs Lab 02/21/15 1454 02/21/15 1838 02/23/15 0706  NA 139 138 141  K 3.5 3.0* 3.8  CL 101 100* 105  CO2 29 28 27   GLUCOSE 76 141* 106*  BUN 25 26* 27*  CREATININE 1.99* 2.03* 2.04*  CALCIUM 10.4 10.8* 9.8   Liver Function Tests:  Recent Labs Lab 02/21/15 1454 02/21/15 1838  AST 21 29  ALT 14 18  ALKPHOS 60 62  BILITOT 1.8*  2.0*  PROT 7.4 8.5*  ALBUMIN 3.8 4.4   No results for input(s): LIPASE, AMYLASE in the last 168 hours. No results for input(s): AMMONIA in the last 168 hours. CBC:  Recent Labs Lab 02/21/15 1510 02/21/15 1838 02/23/15 0706  WBC 3.3* 3.8* 2.9*  NEUTROABS  --  1.2* 1.3*  HGB 12.5 13.3 11.5*  HCT 37.9 41.0 34.9*  MCV 84.2 86.9 86.0  PLT  --  217 149*   Cardiac Enzymes: No results for input(s): CKTOTAL, CKMB, CKMBINDEX, TROPONINI in the last 168 hours. BNP (last 3 results)  Recent  Labs  07/21/14 1436  BNP 2554.2*    ProBNP (last 3 results) No results for input(s): PROBNP in the last 8760 hours.  CBG: No results for input(s): GLUCAP in the last 168 hours.  Recent Results (from the past 240 hour(s))  Urine culture     Status: None (Preliminary result)   Collection Time: 02/21/15  5:39 PM  Result Value Ref Range Status   Colony Count 25,000 COLONIES/ML  Preliminary   Preliminary Report STAPHYLOCOCCUS SPECIES (COAGULASE NEGATIVE)  Preliminary    Comment: Rifampin and Gentamicin should not be used as single drugs for treatment of Staph infections.   Blood culture (routine x 2)     Status: None (Preliminary result)   Collection Time: 02/21/15  8:15 PM  Result Value Ref Range Status   Specimen Description BLOOD LEFT ANTECUBITAL  Final   Special Requests BOTTLES DRAWN AEROBIC AND ANAEROBIC 5CC EACH  Final   Culture   Final    NO GROWTH 2 DAYS Performed at [facility]    Report Status PENDING  Incomplete  Blood culture (routine x 2)     Status: None (Preliminary result)   Collection Time: 02/21/15  8:20 PM  Result Value Ref Range Status   Specimen Description BLOOD RIGHT ANTECUBITAL  Final   Special Requests BOTTLES DRAWN AEROBIC AND ANAEROBIC 5CC EACH  Final   Culture   Final    NO GROWTH 2 DAYS Performed at [facility]    Report Status PENDING  Incomplete     Studies: Mr Shirlee Latch Wo Contrast  02/22/2015  CLINICAL DATA:  Confusion for 4 days, memory loss. Follow-up possible caudate stroke. On Coumadin for atrial fibrillation, history of hypertension, hyperlipidemia. EXAM: MRI HEAD WITHOUT CONTRAST MRA HEAD WITHOUT CONTRAST TECHNIQUE: Multiplanar, multiecho pulse sequences of the brain and surrounding structures were obtained without intravenous contrast. Angiographic images of the head were obtained using MRA technique without contrast. COMPARISON:  CT head February 21, 2015 and MRI of the brain January 17, 2007 FINDINGS: MRI HEAD FINDINGS  24 x 16 mm area reduced diffusion with low ADC value in RIGHT basal ganglia (caudate head and anterior putaminal), expansile associated FLAIR T2 hyperintense signal. Findings correspond to yesterday's CT abnormality. Scattered punctate foci of susceptibility artifact in the supratentorial brain. Larger RIGHT cerebellar encephalomalacia with additional small bilateral cerebellar infarcts. Small area RIGHT occipital lobe encephalomalacia. Old small mesial LEFT thalamic lacunar infarct. Loss of LEFT vertebral artery flow void, loss of proximal basilar artery flow void. Ventricles and sulci are overall normal for patient's age. Patchy supratentorial white matter FLAIR T2 hyperintensities. No abnormal extra-axial fluid collections. Status post bilateral ocular lens implants. Bilateral maxillary mucosal retention cyst with moderate LEFT maxillary sinus mucosal thickening. Paranasal sinuses are well-aerated. No abnormal sellar expansion no abnormal bone marrow signal. No cerebellar tonsillar ectopia. MRA HEAD FINDINGS Anterior circulation: Normal flow related enhancement of the cervical, petrous,  cavernous carotid arteries. At least moderate stenosis anterior genu of LEFT internal carotid artery, likely due to atherosclerosis. High-grade stenosis in luminal regularity of the RIGHT A1 segment, with probable retrograde flow of the distal RIGHT A1 segment via a patent anterior communicating artery. Mild stenosis LEFT A1 origin. Anterior cerebral arteries are patent. Middle cerebral arteries are patent, moderate stenosis RIGHT M1 segment. Posterior circulation: Loss of LEFT vertebral artery flow related enhancement from the included V3 and, V4 segment. Retrograde flow into distal LEFT V4 segment. Luminal irregularity, patent RIGHT V4 segment. Complete loss of basilar artery flow related enhancement within the mid segment to the basilar tip. Small LEFT posterior communicating artery is present. Thready LEFT P1 and proximal P2  segment with distal occlusion. Robust RIGHT posterior communicating artery with minimal retrograde flow into RIGHT P1 segment. RIGHT posterior cerebral artery is widely patent. No aneurysm. Mild generalized luminal irregularity of the intracranial vessels. IMPRESSION: MRI HEAD: Acute RIGHT basal ganglia infarct. Multiple cerebellar infarcts, small RIGHT occipital lobe encephalomalacia/infarct. Old mesial LEFT thalamus lacunar infarct. Scattered susceptibility artifact in a distribution compatible with chronic hypertension. Mild chronic small vessel ischemic disease. MRA HEAD: Occluded LEFT vertebral artery, occluded basilar artery is which are likely chronic considering posterior circulation distribution old infarcts. Complete circle of Willis. Occluded LEFT posterior cerebral artery at P2 segment. High-grade stenosis RIGHT A1 segment, patent anterior communicating artery. Moderate stenosis RIGHT M1 segment. General mild luminal regularity of the vessels compatible with atherosclerosis. Electronically Signed   By: Awilda Metro M.D.   On: 02/22/2015 22:50   Mr Brain Wo Contrast  02/22/2015  CLINICAL DATA:  Confusion for 4 days, memory loss. Follow-up possible caudate stroke. On Coumadin for atrial fibrillation, history of hypertension, hyperlipidemia. EXAM: MRI HEAD WITHOUT CONTRAST MRA HEAD WITHOUT CONTRAST TECHNIQUE: Multiplanar, multiecho pulse sequences of the brain and surrounding structures were obtained without intravenous contrast. Angiographic images of the head were obtained using MRA technique without contrast. COMPARISON:  CT head February 21, 2015 and MRI of the brain January 17, 2007 FINDINGS: MRI HEAD FINDINGS 24 x 16 mm area reduced diffusion with low ADC value in RIGHT basal ganglia (caudate head and anterior putaminal), expansile associated FLAIR T2 hyperintense signal. Findings correspond to yesterday's CT abnormality. Scattered punctate foci of susceptibility artifact in the supratentorial  brain. Larger RIGHT cerebellar encephalomalacia with additional small bilateral cerebellar infarcts. Small area RIGHT occipital lobe encephalomalacia. Old small mesial LEFT thalamic lacunar infarct. Loss of LEFT vertebral artery flow void, loss of proximal basilar artery flow void. Ventricles and sulci are overall normal for patient's age. Patchy supratentorial white matter FLAIR T2 hyperintensities. No abnormal extra-axial fluid collections. Status post bilateral ocular lens implants. Bilateral maxillary mucosal retention cyst with moderate LEFT maxillary sinus mucosal thickening. Paranasal sinuses are well-aerated. No abnormal sellar expansion no abnormal bone marrow signal. No cerebellar tonsillar ectopia. MRA HEAD FINDINGS Anterior circulation: Normal flow related enhancement of the cervical, petrous, cavernous carotid arteries. At least moderate stenosis anterior genu of LEFT internal carotid artery, likely due to atherosclerosis. High-grade stenosis in luminal regularity of the RIGHT A1 segment, with probable retrograde flow of the distal RIGHT A1 segment via a patent anterior communicating artery. Mild stenosis LEFT A1 origin. Anterior cerebral arteries are patent. Middle cerebral arteries are patent, moderate stenosis RIGHT M1 segment. Posterior circulation: Loss of LEFT vertebral artery flow related enhancement from the included V3 and, V4 segment. Retrograde flow into distal LEFT V4 segment. Luminal irregularity, patent RIGHT V4 segment. Complete loss of basilar  artery flow related enhancement within the mid segment to the basilar tip. Small LEFT posterior communicating artery is present. Thready LEFT P1 and proximal P2 segment with distal occlusion. Robust RIGHT posterior communicating artery with minimal retrograde flow into RIGHT P1 segment. RIGHT posterior cerebral artery is widely patent. No aneurysm. Mild generalized luminal irregularity of the intracranial vessels. IMPRESSION: MRI HEAD: Acute RIGHT  basal ganglia infarct. Multiple cerebellar infarcts, small RIGHT occipital lobe encephalomalacia/infarct. Old mesial LEFT thalamus lacunar infarct. Scattered susceptibility artifact in a distribution compatible with chronic hypertension. Mild chronic small vessel ischemic disease. MRA HEAD: Occluded LEFT vertebral artery, occluded basilar artery is which are likely chronic considering posterior circulation distribution old infarcts. Complete circle of Willis. Occluded LEFT posterior cerebral artery at P2 segment. High-grade stenosis RIGHT A1 segment, patent anterior communicating artery. Moderate stenosis RIGHT M1 segment. General mild luminal regularity of the vessels compatible with atherosclerosis. Electronically Signed   By: Awilda Metro M.D.   On: 02/22/2015 22:50    Scheduled Meds: . amiodarone  100 mg Oral Daily  . carvedilol  3.125 mg Oral BID WC  . furosemide  10 mg Oral Daily  . hydrALAZINE  25 mg Oral 3 times per day  . isosorbide mononitrate  30 mg Oral Daily  . pantoprazole  40 mg Oral Daily  . simvastatin  40 mg Oral Daily  . warfarin  1 mg Oral ONCE-1800  . Warfarin - Pharmacist Dosing Inpatient   Does not apply q1800   Continuous Infusions:   Principal Problem:   Right basal ganglia embolic stroke (HCC) Active Problems:   S/P MVR (mitral valve replacement)   Chronic kidney disease (CKD), stage IV (severe) (HCC)   Hypokalemia   Hypercalcemia   Subtherapeutic international normalized ratio (INR)   CVA (cerebral infarction)   CHIU, STEPHEN K  [facility] Pager [phone]. If 7PM-7AM, please contact night-coverage at [email or web address], password [facility] 02/23/2015, 4:28 PM  LOS: 2 days

## 2015-02-23 NOTE — Progress Notes (Signed)
Received a call from tele that patient's heart rate was in the 40's. Patient asymptomatic, MD was on the unit and was notified verbally.

## 2015-02-23 NOTE — Progress Notes (Signed)
Occupational Therapy Treatment Patient Details Name: Jeniece Longcore MRN: [id number] DOB: 1945-08-08 Today's Date: 02/23/2015    History of present illness 70 year old female with a past medical history significant for HTN, HLD, s/p MVR ( St. Jude mechanical valve s/p thrombosis and revision), valvular endocarditis in 2000, and combined systolic and diastolic CHF(last echo in 12/2014 EF 25-30% with grade 3 diastolic dysfunction). Pt admitted for confusion/memory loss. Test revealed Low-density within the right basal ganglia compatible with age-indeterminate infarct. This is suspicious for acute to subacute infarct.   OT comments  Pt demonstrates improved balance today.  She requires supervision with ADL.  Pt demonstrates impaired working memory, impaired problem solving, and poor awareness of deficits and impact on function.   At this time, recommend she have 24 hour supervision for safety and recommend no driving until cleared by [employer]OT and/or undergoes a driving assessment.  Brother present during this conversation and verbalizes understanding of this info.    Follow Up Recommendations  Outpatient OT;Supervision/Assistance - 24 hour    Equipment Recommendations  3 in 1 bedside comode    Recommendations for Other Services      Precautions / Restrictions Precautions Precautions: Fall       Mobility Bed Mobility                  Transfers Overall transfer level: Needs assistance Equipment used: None Transfers: Sit to/from Stand;Stand Pivot Transfers Sit to Stand: Supervision Stand pivot transfers: Supervision            Balance Overall balance assessment: Needs assistance Sitting-balance support: Feet supported Sitting balance-Leahy Scale: Normal       Standing balance-Leahy Scale: Good                     ADL                                         General ADL Comments: Pt able to simulate shower in standing this date with  supervision with unilateral UE support and no LOB.   She was able to retrieve items from floor with supervision.   She performed path finding task with min A.  Despite being told to recall her room number at the beginning of the activity, she was unable to recall it at the end of the session.  She found her room by trial and error only due to recognizing her brother sitting in her room.   If he had not been there, she would not have been able to locate it as she had just stated "I know it's not the last room"        Vision                     Perception     Praxis      Cognition   Behavior During Therapy: [facility] for tasks assessed/performed Overall Cognitive Status: Impaired/Different from baseline       Memory: Decreased short-term memory    Safety/Judgement: Decreased awareness of deficits;Decreased awareness of safety     General Comments: Pt does not recall conversations yesterday re: her memory deficits, and that she almost fell with OT yesterday.  Brother present and reminded her of these things.  Pt with very poor awareness of her deficits and their impact on her function despite long conversation with her YK:DXIPJA risks.  Extremity/Trunk Assessment               Exercises     Shoulder Instructions       General Comments      Pertinent Vitals/ Pain       Pain Assessment: No/denies pain  Home Living                                          Prior Functioning/Environment              Frequency Min 2X/week     Progress Toward Goals  OT Goals(current goals can now be found in the care plan section)  Progress towards OT goals: Progressing toward goals  ADL Goals Pt Will Perform Tub/Shower Transfer: Tub transfer;with supervision;ambulating;3 in 1;with set-up Additional ADL Goal #1: Pt/family will verbalize understanding of strategies to help with memory. Additional ADL Goal #2: Pt will gather ADL items from low drawer at Mod  I level. Additional ADL Goal #3: Pt will independently perform HEP for bilateral UEs to increase strength.  Plan Discharge plan needs to be updated    Co-evaluation                 End of Session     Activity Tolerance Patient tolerated treatment well   Patient Left in chair;with call bell/phone within reach;with chair alarm set;with family/visitor present   Nurse Communication Mobility status        Time: 1610-9604 OT Time Calculation (min): 27 min  Charges: OT General Charges $OT Visit: 1 Procedure OT Treatments $Self Care/Home Management : 8-22 mins $Therapeutic Activity: 8-22 mins  Kimmora Risenhoover M 02/23/2015, 12:28 PM

## 2015-02-23 NOTE — Progress Notes (Signed)
ANTICOAGULATION CONSULT NOTE - Follow Up Consult  Pharmacy Consult for warfarin  Indication: bioprosthetic heart valve replacement in July 2016 and chronic afib   Vital Signs: Temp: 98 F (36.7 C) (02/16 0959) Temp Source: Oral (02/16 0959) BP: 118/62 mmHg (02/16 0959) Pulse Rate: 57 (02/16 0959)  Labs:  Recent Labs  02/21/15 1454 02/21/15 1510 02/21/15 1838 02/21/15 2034 02/22/15 0953 02/23/15 0706  HGB  --  12.5 13.3  --   --  11.5*  HCT  --  37.9 41.0  --   --  34.9*  PLT  --   --  217  --   --  149*  APTT  --   --   --  33  --   --   LABPROT  --   --  19.5*  --  23.1* 28.9*  INR  --   --  1.65*  --  2.06* 2.78*  HEPARINUNFRC  --   --   --   --  0.32  --   CREATININE 1.99*  --  2.03*  --   --  2.04*    Assessment: 58 yoF with PMHx for bioprosthetic mitral valve replacement/redo in 07/2014 and chronic afib on coumadin PTA that was admitted on 2/14 with AMS and found to have subcortical stroke on the right. INR on admission 1.65. Neurology felt the stroke was small and that the benefits out weigh the risk for anticoagulation vs conversion. Heparin was subsequently started and coumadin resumed. Heparin stopped 2/16 with therapeutic INR.   INR today is 2.78 - therapeutic with 5.8 sec jump in protime.  Hg 11.5, pltc down to 149.  No bleeding reported.  PTA dose of coumadin: 1 mg on Mon/Fri and 2 mg all other days, last dose 2/13  Goal of Therapy: INR 2-3  Plan:  -coumadin 1 mg po x 1 dose today - daily INR  Herby Abraham, Pharm.D. [phone] 02/23/2015 11:47 AM

## 2015-02-24 ENCOUNTER — Other Ambulatory Visit: Payer: Self-pay | Admitting: *Deleted

## 2015-02-24 ENCOUNTER — Other Ambulatory Visit: Payer: Self-pay

## 2015-02-24 DIAGNOSIS — I679 Cerebrovascular disease, unspecified: Secondary | ICD-10-CM

## 2015-02-24 DIAGNOSIS — E785 Hyperlipidemia, unspecified: Secondary | ICD-10-CM

## 2015-02-24 LAB — BASIC METABOLIC PANEL
ANION GAP: 8 (ref 5–15)
BUN: 24 mg/dL — ABNORMAL HIGH (ref 6–20)
CO2: 29 mmol/L (ref 22–32)
Calcium: 9.9 mg/dL (ref 8.9–10.3)
Chloride: 105 mmol/L (ref 101–111)
Creatinine, Ser: 1.93 mg/dL — ABNORMAL HIGH (ref 0.44–1.00)
GFR calc Af Amer: 30 mL/min — ABNORMAL LOW (ref >60)
GFR, EST NON AFRICAN AMERICAN: 26 mL/min — AB (ref >60)
Glucose, Bld: 93 mg/dL (ref 65–99)
POTASSIUM: 3.7 mmol/L (ref 3.5–5.1)
SODIUM: 142 mmol/L (ref 135–145)

## 2015-02-24 LAB — PROTIME-INR
INR: 2.71 — AB (ref 0.00–1.49)
Prothrombin Time: 28.4 seconds — ABNORMAL HIGH (ref 11.6–15.2)

## 2015-02-24 LAB — URINE CULTURE: Colony Count: 25000

## 2015-02-24 MED ORDER — WARFARIN SODIUM 2 MG PO TABS: 2.0000 mg | ORAL_TABLET | ORAL | Status: DC

## 2015-02-24 MED ORDER — ASPIRIN EC 81 MG PO TBEC: 81.0000 mg | DELAYED_RELEASE_TABLET | Freq: Every day | ORAL | Status: DC

## 2015-02-24 MED ORDER — WARFARIN SODIUM 1 MG PO TABS
1.0000 mg | ORAL_TABLET | ORAL | Status: DC
  Filled 2015-02-24: qty 1

## 2015-02-24 NOTE — Care Management Important Message (Signed)
Important Message  Patient Details  Name: [name] MRN: [id number] Date of Birth: [birth year]   Medicare Important Message Given:  Yes    Kayle Passarelli P Jamani Eley 02/24/2015, 11:49 AM

## 2015-02-24 NOTE — Progress Notes (Signed)
PT NOTE: Pt performed Berg Balance Assessment and was in moderate fall risk category with most difficulty with SLS and tandem stance.  Daughter present and supportive.  Gave handout for standing LE therex.  Pt a little more clear today, but still with memory deficits.    02/24/15 1000  PT Visit Information  Last PT Received On 02/24/15  Assistance Needed +1  History of Present Illness 70 year old female with a past medical history significant for HTN, HLD, s/p MVR ( St. Jude mechanical valve s/p thrombosis and revision), valvular endocarditis in 2000, and combined systolic and diastolic CHF(last echo in 12/2014 EF 25-30% with grade 3 diastolic dysfunction). Pt admitted for confusion/memory loss. Test revealed Low-density within the right basal ganglia compatible with age-indeterminate infarct. This is suspicious for acute to subacute infarct.  PT Time Calculation  PT Start Time (ACUTE ONLY) 0945  PT Stop Time (ACUTE ONLY) 1009  PT Time Calculation (min) (ACUTE ONLY) 24 min  Subjective Data  Subjective Daughter present  Precautions  Precautions Fall  Pain Assessment  Pain Assessment No/denies pain  Cognition  Arousal/Alertness Awake/alert  Behavior During Therapy WFL for tasks assessed/performed  Overall Cognitive Status Impaired/Different from baseline  Memory Decreased short-term memory  Safety/Judgement Decreased awareness of deficits  General Comments Pt does not remember PT from Wednesday, but aware that is is February and that Valentine's day was this week. This is an improvment since last session.  Bed Mobility  Overal bed mobility Modified Independent  Supine to sit Modified independent (Device/Increase time)  Transfers  Equipment used None  Transfers Sit to/from Stand  Sit to Stand Modified independent (Device/Increase time)  Stand pivot transfers Modified independent (Device/Increase time)  General transfer comment no LOB with transfer. Stood from bed and toilet   Ambulation/Gait  Ambulation/Gait assistance Modified independent (Device/Increase time)  Ambulation Distance (Feet) 300 Feet  Assistive device None  Gait Pattern/deviations Step-through pattern  General Gait Details Pt able to perform side stepping and backwards gait without difficulty. Pt was able to locate room after gait.  Gait velocity WNL  Stairs (deferred, pt and daughter state she never has to do stairs)  Modified Rankin (Stroke Patients Only)  Pre-Morbid Rankin Score 0  Modified Rankin 1  Balance  Sitting balance-Leahy Scale Normal  Standing balance-Leahy Scale Good  Standardized Balance Assessment  Standardized Balance Assessment  Berg Balance Test  Berg Balance Test  Sit to Stand 4  Standing Unsupported 4  Sitting with Back Unsupported but Feet Supported on Floor or Stool 4  Stand to Sit 4  Transfers 4  Standing Unsupported with Eyes Closed 3  Standing Ubsupported with Feet Together 4  From Standing, Reach Forward with Outstretched Arm 4  From Standing Position, Pick up Object from Floor 4  From Standing Position, Turn to Look Behind Over each Shoulder 4  Turn 360 Degrees 4  Standing Unsupported, Alternately Place Feet on Step/Stool 4  Standing Unsupported, One Foot in Front 1  Standing on One Leg 2  Total Score 50  Exercises  Exercises (discussed standing ther ex with daughter and given handout)  PT - End of Session  Activity Tolerance Patient tolerated treatment well  Patient left in bed;with family/visitor present  Nurse Communication Mobility status  PT - Assessment/Plan  PT Plan Current plan remains appropriate  PT Frequency (ACUTE ONLY) Min 4X/week  Follow Up Recommendations No PT follow up;Supervision/Assistance - 24 hour  PT equipment None recommended by PT  PT Goal Progression  Progress towards PT goals  Progressing toward goals  Acute Rehab PT Goals  PT Goal Formulation With patient  Time For Goal Achievement 03/01/15  Potential to Achieve Goals  Good  PT General Charges  $$ ACUTE PT VISIT 1 Procedure  PT Treatments  $Gait Training 8-22 mins  $Neuromuscular Re-education 8-22 mins

## 2015-02-24 NOTE — Discharge Instructions (Signed)

## 2015-02-24 NOTE — Patient Outreach (Signed)
[facility]) Care Management  02/24/2015  Kailin Bakos 1945/06/07 [id number]  Case closed to Telephonic RN CM services and Transitioned to [employer] CM services for Transition of Care services.   Donato Schultz, RN, BSN, [facility], CCM  [facility] [employer] Management Coordinator [phone] Direct [phone] Cell [phone] Office [phone] Fax

## 2015-02-24 NOTE — Care Management Note (Signed)
Case Management Note  Patient Details  Name: [name] MRN: [id number] Date of Birth: 15-Nov-1945  Subjective/Objective:                    Action/Plan: Patient discharging home today with scripts for outpatient OT/ST. CM met with the patient and her family and asked about patient attending [facility] for her outpatient therapy. They would like to think about it and maybe find something closer to her home. CM left them with the map and phone number for [facility] and instructed them to call and make an appointment with them or another outpatient therapy group they select. They voiced understanding. Bedside RN updated.   Expected Discharge Date:   (unknown)               Expected Discharge Plan:  Home/Self Care  In-House Referral:     Discharge planning Services  CM Consult  Post Acute Care Choice:    Choice offered to:     DME Arranged:    DME Agency:     HH Arranged:    [place] Agency:     Status of Service:  Completed, signed off  Medicare Important Message Given:  Yes Date Medicare IM Given:    Medicare IM give by:    Date Additional Medicare IM Given:    Additional Medicare Important Message give by:     If discussed at [place] of Stay Meetings, dates discussed:    Additional Comments:  Pollie Friar, RN 02/24/2015, 1:51 PM

## 2015-02-24 NOTE — Progress Notes (Signed)
Patient is being d/c, d/c instructions given and patient verbalized understanding. 

## 2015-02-24 NOTE — Consult Note (Signed)
   [facility] CM Inpatient Consult   02/24/2015  [name] 22-Aug-1945 [id number]   Spoke with patient prior to discharge. She has been engaged by [facility] Telephonic RNCM. Please see chart review tab then notes in EPIC for further [facility] correspondence with patient/family. Discussed at bedside that she will be followed by [facility] RNCM for transition of care calls and possible monthly home visits if needed. She is agreeable to this. Written consent obtained. Patient brother, Fayrene Fearing, was at bedside as well during conversation. [facility] Care Management packet along with disclosure and contact information left at bedside. Appreciative of visit. Will request to be assigned to [facility] RNCM. Will make inpatient RNCM aware of the above as well.   Raiford Noble, MSN-Ed, RN,BSN [facility] Liaison [phone]

## 2015-02-24 NOTE — Progress Notes (Signed)
ANTICOAGULATION CONSULT NOTE - Follow Up Consult  Pharmacy Consult for warfarin  Indication: bioprosthetic heart valve replacement in July 2016 and chronic afib   Vital Signs: Temp: 98.1 F (36.7 C) (02/17 0540) Temp Source: Oral (02/17 0540) BP: 111/58 mmHg (02/17 0540) Pulse Rate: 67 (02/17 0540)  Labs:  Recent Labs  02/21/15 1510  02/21/15 1838 02/21/15 2034 02/22/15 0953 02/23/15 0706 02/24/15 0536  HGB 12.5  --  13.3  --   --  11.5*  --   HCT 37.9  --  41.0  --   --  34.9*  --   PLT  --   --  217  --   --  149*  --   APTT  --   --   --  33  --   --   --   LABPROT  --   < > 19.5*  --  23.1* 28.9* 28.4*  INR  --   < > 1.65*  --  2.06* 2.78* 2.71*  HEPARINUNFRC  --   --   --   --  0.32  --   --   CREATININE  --   --  2.03*  --   --  2.04* 1.93*  < > = values in this interval not displayed.  Assessment: 40 yoF with PMHx for bioprosthetic mitral valve replacement/redo in 07/2014 and chronic afib on coumadin PTA that was admitted on 2/14 with AMS and found to have subcortical stroke on the right. INR on admission 1.65. Neurology felt the stroke was small and that the benefits out weigh the risk for anticoagulation vs conversion. Heparin was subsequently started and coumadin resumed. Heparin stopped 2/16 with therapeutic INR.   INR today is 2.71 - therapeutic.  Hg 11.5, pltc down to 149 yestereday.  No bleeding reported.  PTA dose of coumadin: 1 mg on Mon/Fri and 2 mg all other days, last dose 2/13  Goal of Therapy: INR 2-3  Plan:  -resume home dose 1 mg Mon/Fri, 2 mg all other days - daily INR - has coumadin clinic visit scheduled for 03/08/15  Herby Abraham, Pharm.D. [phone] 02/24/2015 9:46 AM

## 2015-02-26 LAB — CULTURE, BLOOD (ROUTINE X 2)
CULTURE: NO GROWTH
Culture: NO GROWTH

## 2015-02-27 ENCOUNTER — Other Ambulatory Visit: Payer: Self-pay | Admitting: *Deleted

## 2015-02-27 NOTE — Patient Outreach (Signed)
[facility]) Care Management  02/27/2015  [name] 04-02-1945 [id number]    Assessment: Transition of care week 1 Referral received from hospital liaison (A.Hall) for transition of care. 70 year old with recent admission to hospital (2/14- 02/24/15) with altered mental status/ confusion; acute diastolic heart failure. CT scan showed suspicious for acute to subacute infarct.  Call placed and spoke with patient. Patient reports feeling better. She reports being able to do self care but daughter manages her checks and finances. According to patient, she use to live alone but now she is living with her daughter and daughter's family. Patient states that daughter went out and will be coming back just the same.   Spoke with daughter [name]) when she came in and she seemed upset about talking to patient. Daughter states that patient does not know much of what is being talked to and makes her upset.   Apologized to daughter and informed daughter that patient answered the call and seemed okay with her answers.   Transition of care call completed with daughter [name]).  Patient's daughter reports good understanding of medications, diet and hospital stay. Patient states patient does not have a schedule for follow up appointment with primary care provider and neurology as well as occupational and speech therapy yet since she just discharged last Friday, but daughter is aware to call, set-up/ schedule for appointments.  Transportation is being provided by daughter/ family as stated.   Patient's daughter reports managing medications and patient takes it on her own.  According to daughter, patient has all her medication supplies.  Daughter reports no immediate needs at this time. Attempted to set- up a home visit with daughter but she states that she "will need to regroup" because she felt agitated. She told care management coordinator that she will call back to inform what  was decided on regarding home visit.    Plan: Will await for daughter's call. If unable to receive a call back, will schedule for next outreach call.  [name], BSN, RN-BC [facility] Community Care Management Coordinator Cell: [phone]

## 2015-03-01 ENCOUNTER — Other Ambulatory Visit: Payer: Self-pay | Admitting: *Deleted

## 2015-03-01 NOTE — Progress Notes (Signed)
This encounter was created in error - please disregard.

## 2015-03-01 NOTE — Patient Outreach (Signed)
[facility]) Care Management  03/01/2015  [name] 1945/01/22 [id number]   Assessment: Care coordination call  Received message from care management assistant that patient is showing Red on EMMI dashboard for stroke.  Call placed to daughter's phone number (as what was requested on our first conversation) but unable to reach her. HIPAA compliant voice message left with name and contact number.  Call also placed to patient's phone number and daughter answered the call. Daughter seemed upset that care management coordinator called patient's phone number and since she told care management coordinator that she will give a call back and not care coordinator calling her. It was explained to daughter that her phone number was first contacted but unable to get hold of her. Attempted to reach daughter on patient's phone number and daughter answered the call.  Daughter was made aware of the call to follow-up with patient since she is showing red on EMMI stroke which showed that a follow-up appointment with provider has not been scheduled yet for the patient and care management coordinator can be of help schedule an appointment if needed.  Patient's daughter seemed more agitated and states that she is being told to do what she is suppose to be doing. Daughter was made aware that this is just to give a reminder of the things needed to be in place for patient's continued care.  Patient's daughter notified care management coordinator that she had already made a neurology appointment for April but not with primary care provider yet.  Daughter was made aware that patient will continue to show red on EMMI stroke dashboard until an appointment is scheduled to see her primary care provider within 2 weeks of discharge. She states that she will be making the appointment then.   Attempt to arrange appointment for home visit with patient for next week as well. Patient told care management  coordinator that she is driving. Offered to call her back since she is presently driving and daughter states "if it is not going to be done now then it will not happen". Appointment date of 03/10/15 at 9 am was made but patient's daughter then asked care management coordinator if a permission was given to call her by the person whom she spoke to from [facility] office. She states that she spoke to someone at [facility] office who told her that she will be assisted to find another person to see patient instead of this care management coordinator.  She was unable to provide the name of the person that she spoke to and told care management coordinator that she will get in contact with that person and will call care management coordinator back. She then proceeded to hang up the phone.   Plan: Will await for daughter's call back. Will coordinate with Livia Snellen [facility] Assistant Director) on the best way to proceed with this case.   [name], BSN, RN-BC [facility] Community Care Management Coordinator Cell: [phone]

## 2015-03-04 ENCOUNTER — Other Ambulatory Visit: Payer: Self-pay | Admitting: *Deleted

## 2015-03-04 NOTE — Patient Outreach (Signed)
[name]) Care Management  03/04/2015  [name] Oct 17, 1945 [id number]   Assessment: Care coordination Per patient's daughter request, continued care management services by other care management coordinator. Elliot Cousin, community care coordinator will follow up on patient.   Plan: Cancel scheduled transition of care call for 03/10/15.   Brittlyn Cloe A. Vickie Ponds, BSN, RN-BC [facility] Community Care Management Coordinator Cell: [phone]

## 2015-03-06 ENCOUNTER — Telehealth (HOSPITAL_COMMUNITY): Payer: Self-pay | Admitting: *Deleted

## 2015-03-06 NOTE — Telephone Encounter (Signed)
Patient given detailed instructions per Myocardial Perfusion Study Information Sheet for the test on 3/1/17Patient notified to arrive 15 minutes early and that it is imperative to arrive on time for appointment to keep from having the test rescheduled.  If you need to cancel or reschedule your appointment, please call the office within 24 hours of your appointment. Failure to do so may result in a cancellation of your appointment, and a $50 no show fee. Patient verbalized understanding. [name] J Alaina Donati, RN  

## 2015-03-07 ENCOUNTER — Encounter: Payer: Medicare Other | Admitting: Pharmacist Clinician (PhC)/ Clinical Pharmacy Specialist

## 2015-03-07 ENCOUNTER — Ambulatory Visit: Payer: Self-pay

## 2015-03-07 ENCOUNTER — Ambulatory Visit (INDEPENDENT_AMBULATORY_CARE_PROVIDER_SITE_OTHER): Payer: Medicare Other | Admitting: Pharmacist Clinician (PhC)/ Clinical Pharmacy Specialist

## 2015-03-07 DIAGNOSIS — Z953 Presence of xenogenic heart valve: Secondary | ICD-10-CM

## 2015-03-07 DIAGNOSIS — Z7901 Long term (current) use of anticoagulants: Secondary | ICD-10-CM | POA: Diagnosis not present

## 2015-03-07 LAB — POCT INR: INR: 1.7

## 2015-03-07 NOTE — Discharge Summary (Signed)
Physician Discharge Summary  [name] OEU:[id number] DOB: [birth year] DOA: 02/21/2015  PCP: Shade Flood, MD  Admit date: 02/21/2015 Discharge date: 03/07/2015  Time spent: 20 minutes  Recommendations for Outpatient Follow-up:  1. Follow up with PCP in 2-3 weeks 2. Follow up with Neurology as scheduled   Discharge Diagnoses:  Principal Problem:   Right basal ganglia embolic stroke [facility]) Active Problems:   S/P MVR (mitral valve replacement)   Chronic kidney disease (CKD), stage IV (severe) (HCC)   Hypokalemia   Hypercalcemia   Subtherapeutic international normalized ratio (INR)   CVA (cerebral infarction)   Intracranial vascular stenosis   HLD (hyperlipidemia)   Discharge Condition: Stable  Diet recommendation: Heart healthy  Filed Weights   02/22/15 0400  Weight: 61.689 kg (136 lb)    History of present illness:  Please review dictated H and P from 2/14 for details. Briefly, 70 year old female with a past medical history significant for HTN, HLD, s/p MVR ( [employer] mechanical valve s/p thrombosis/cardiac arrest and revision ), valvular endocarditis in 2000, and combined systolic and diastolic CHF(last echo in 12/2014 EF 25-30% with grade 3 diastolic dysfunction); who presents with complaints of confusion for the past 3 days prior to admission. Patient was found to have subtherapeutic INR on admit  Hospital Course:  Right basal ganglia infarct: Subacute.  - Consulted Neurology - 2d echocardiogram with improved EF from prior study from 25-30% to 50-55% - Bilateral carotid Dopplers with findings suggestive of L subclavian steal - PT/OT/speech to eval and treat - MRI/MRA with findings of multiple infarcts - discussed with Neurology. Recs to continue on coumadin plus 325mg  aspirin daily  S/p mechanical valve replacement on chronic anticoagulation therapy with prosthetic valve replacement in 2016: Patient is on chronic anticoagulation therapy, presented  subtherapeutic. - Initially started on heparin drip, since stopped given therapeutic INR - Pharmacy to dose Coumadin - Goal INR 2.0-3.0 for afib  Chronic kidney disease stage 4: Patient at her baseline with a creatinine that varies between 2 and 3. Patient denies having a nephrologist in which she sees on a regular basis. - Continue to monitor - May want to set up patient with a nephrologist as an outpatient  Hypokalemia: Initial potassium 3.0  - replaced  Mild hypercalcemia: Calcium 10.8 initial blood work - Improved. Currently 9.8  Afib - Rate controlled at present - Continue coumadin as per above  Consultations:  Neurology/Stroke Team  Discharge Exam: Filed Vitals:   02/24/15 0041 02/24/15 0540 02/24/15 0949 02/24/15 1349  BP: 116/61 111/58 120/58 105/67  Pulse: 58 67 62 60  Temp:  98.1 F (36.7 C) 97.7 F (36.5 C) 98.4 F (36.9 C)  TempSrc:  Oral Oral Oral  Resp:   18 18  Height:      Weight:      SpO2: 100% 99% 97% 98%    General: awake, in nad Cardiovascular: regular, s1, s2 Respiratory: normal resp effort, no wheezing  Discharge Instructions  Discharge Instructions    AMB Referral to [facility] Care Management    Complete by:  As directed   Please assign to community [facility] RNCM. Has been active with [facility] Telephonic RNCM. Written consent obtained. To discharge home today 02/24/15. Thank you and please call with questions. Raiford Noble, MSN-Ed, [facility] [phone]  Reason for consult:  Please assign to [facility] RNCM. Currently active with Telephonic RNCM  Expected date of contact:  1-3 days (reserved for hospital discharges)     Ambulatory referral  to Neurology    Complete by:  As directed   Pt will follow up with Dr. Roda Shutters at [facility] in about 2 months. Thanks.     Ambulatory referral to Occupational Therapy    Complete by:  As directed      Ambulatory referral to Speech Therapy    Complete by:  As directed             Medication List     TAKE these medications        amiodarone 200 MG tablet  Commonly known as:  PACERONE  Take 0.5 tablets (100 mg total) by mouth daily.     aspirin EC 81 MG tablet  Take 1 tablet (81 mg total) by mouth daily.     carvedilol 3.125 MG tablet  Commonly known as:  COREG  Take 1 tablet (3.125 mg total) by mouth 2 (two) times daily with a meal.     furosemide 20 MG tablet  Commonly known as:  LASIX  Take 1 tablet (20 mg total) by mouth daily.     hydrALAZINE 25 MG tablet  Commonly known as:  APRESOLINE  Take 1 tablet (25 mg total) by mouth every 8 (eight) hours.     isosorbide mononitrate 30 MG 24 hr tablet  Commonly known as:  IMDUR  Take 1 tablet (30 mg total) by mouth daily.     pantoprazole 40 MG tablet  Commonly known as:  PROTONIX  Take 1 tablet (40 mg total) by mouth daily.     saccharomyces boulardii 250 MG capsule  Commonly known as:  FLORASTOR  Take 1 capsule (250 mg total) by mouth 2 (two) times daily.     simvastatin 40 MG tablet  Commonly known as:  ZOCOR  Take 1 tablet (40 mg total) by mouth daily.     warfarin 2 MG tablet  Commonly known as:  COUMADIN  Take 1 tablet by mouth daily or as directed by coumadin clinic            Follow-up Information    Follow up with Xu,Jindong, MD. Schedule an appointment as soon as possible for a visit in 2 months.   Specialty:  Neurology   Why:  stroke clinic,OFFICE    Contact information:   [address] [place] [address] [phone]       Follow up with Shade Flood, MD. Schedule an appointment as soon as possible for a visit in 2 weeks.   Specialties:  Family Medicine, Sports Medicine   Why:  Hospital follow up,OFFICE CLOSED AT 12NOON TODAY   Contact information:   [address] [place] [address] [phone]        The results of significant diagnostics from this hospitalization (including imaging, microbiology, ancillary and laboratory) are listed below for reference.     Significant Diagnostic Studies: Ct Head Wo Contrast  02/21/2015  CLINICAL DATA:  Memory loss, altered mental status, dizziness EXAM: CT HEAD WITHOUT CONTRAST TECHNIQUE: Contiguous axial images were obtained from the base of the skull through the vertex without intravenous contrast. COMPARISON:  MRI 01/17/2007 FINDINGS: Encephalomalacia within the right cerebellar hemisphere from old infarct. There is low-density within the right basal ganglia which is compatible with infarct, age indeterminate a concerning for acute to subacute infarct. No hemorrhage. No hydrocephalus or midline shift. No acute calvarial abnormality. IMPRESSION: Low-density within the right basal ganglia compatible with age-indeterminate infarct. This is suspicious for acute to subacute infarct. Old right cerebellar infarct and  encephalomalacia. Electronically Signed   By: Charlett Nose M.D.   On: 02/21/2015 16:32   Mr Maxine Glenn Head Wo Contrast  02/22/2015  CLINICAL DATA:  Confusion for 4 days, memory loss. Follow-up possible caudate stroke. On Coumadin for atrial fibrillation, history of hypertension, hyperlipidemia. EXAM: MRI HEAD WITHOUT CONTRAST MRA HEAD WITHOUT CONTRAST TECHNIQUE: Multiplanar, multiecho pulse sequences of the brain and surrounding structures were obtained without intravenous contrast. Angiographic images of the head were obtained using MRA technique without contrast. COMPARISON:  CT head February 21, 2015 and MRI of the brain January 17, 2007 FINDINGS: MRI HEAD FINDINGS 24 x 16 mm area reduced diffusion with low ADC value in RIGHT basal ganglia (caudate head and anterior putaminal), expansile associated FLAIR T2 hyperintense signal. Findings correspond to yesterday's CT abnormality. Scattered punctate foci of susceptibility artifact in the supratentorial brain. Larger RIGHT cerebellar encephalomalacia with additional small bilateral cerebellar infarcts. Small area RIGHT occipital lobe encephalomalacia. Old small mesial LEFT  thalamic lacunar infarct. Loss of LEFT vertebral artery flow void, loss of proximal basilar artery flow void. Ventricles and sulci are overall normal for patient's age. Patchy supratentorial white matter FLAIR T2 hyperintensities. No abnormal extra-axial fluid collections. Status post bilateral ocular lens implants. Bilateral maxillary mucosal retention cyst with moderate LEFT maxillary sinus mucosal thickening. Paranasal sinuses are well-aerated. No abnormal sellar expansion no abnormal bone marrow signal. No cerebellar tonsillar ectopia. MRA HEAD FINDINGS Anterior circulation: Normal flow related enhancement of the cervical, petrous, cavernous carotid arteries. At least moderate stenosis anterior genu of LEFT internal carotid artery, likely due to atherosclerosis. High-grade stenosis in luminal regularity of the RIGHT A1 segment, with probable retrograde flow of the distal RIGHT A1 segment via a patent anterior communicating artery. Mild stenosis LEFT A1 origin. Anterior cerebral arteries are patent. Middle cerebral arteries are patent, moderate stenosis RIGHT M1 segment. Posterior circulation: Loss of LEFT vertebral artery flow related enhancement from the included V3 and, V4 segment. Retrograde flow into distal LEFT V4 segment. Luminal irregularity, patent RIGHT V4 segment. Complete loss of basilar artery flow related enhancement within the mid segment to the basilar tip. Small LEFT posterior communicating artery is present. Thready LEFT P1 and proximal P2 segment with distal occlusion. Robust RIGHT posterior communicating artery with minimal retrograde flow into RIGHT P1 segment. RIGHT posterior cerebral artery is widely patent. No aneurysm. Mild generalized luminal irregularity of the intracranial vessels. IMPRESSION: MRI HEAD: Acute RIGHT basal ganglia infarct. Multiple cerebellar infarcts, small RIGHT occipital lobe encephalomalacia/infarct. Old mesial LEFT thalamus lacunar infarct. Scattered susceptibility  artifact in a distribution compatible with chronic hypertension. Mild chronic small vessel ischemic disease. MRA HEAD: Occluded LEFT vertebral artery, occluded basilar artery is which are likely chronic considering posterior circulation distribution old infarcts. Complete circle of Willis. Occluded LEFT posterior cerebral artery at P2 segment. High-grade stenosis RIGHT A1 segment, patent anterior communicating artery. Moderate stenosis RIGHT M1 segment. General mild luminal regularity of the vessels compatible with atherosclerosis. Electronically Signed   By: Awilda Metro M.D.   On: 02/22/2015 22:50   Mr Brain Wo Contrast  02/22/2015  CLINICAL DATA:  Confusion for 4 days, memory loss. Follow-up possible caudate stroke. On Coumadin for atrial fibrillation, history of hypertension, hyperlipidemia. EXAM: MRI HEAD WITHOUT CONTRAST MRA HEAD WITHOUT CONTRAST TECHNIQUE: Multiplanar, multiecho pulse sequences of the brain and surrounding structures were obtained without intravenous contrast. Angiographic images of the head were obtained using MRA technique without contrast. COMPARISON:  CT head February 21, 2015 and MRI of the brain January 17, 2007 FINDINGS: MRI HEAD FINDINGS 24 x 16 mm area reduced diffusion with low ADC value in RIGHT basal ganglia (caudate head and anterior putaminal), expansile associated FLAIR T2 hyperintense signal. Findings correspond to yesterday's CT abnormality. Scattered punctate foci of susceptibility artifact in the supratentorial brain. Larger RIGHT cerebellar encephalomalacia with additional small bilateral cerebellar infarcts. Small area RIGHT occipital lobe encephalomalacia. Old small mesial LEFT thalamic lacunar infarct. Loss of LEFT vertebral artery flow void, loss of proximal basilar artery flow void. Ventricles and sulci are overall normal for patient's age. Patchy supratentorial white matter FLAIR T2 hyperintensities. No abnormal extra-axial fluid collections. Status post  bilateral ocular lens implants. Bilateral maxillary mucosal retention cyst with moderate LEFT maxillary sinus mucosal thickening. Paranasal sinuses are well-aerated. No abnormal sellar expansion no abnormal bone marrow signal. No cerebellar tonsillar ectopia. MRA HEAD FINDINGS Anterior circulation: Normal flow related enhancement of the cervical, petrous, cavernous carotid arteries. At least moderate stenosis anterior genu of LEFT internal carotid artery, likely due to atherosclerosis. High-grade stenosis in luminal regularity of the RIGHT A1 segment, with probable retrograde flow of the distal RIGHT A1 segment via a patent anterior communicating artery. Mild stenosis LEFT A1 origin. Anterior cerebral arteries are patent. Middle cerebral arteries are patent, moderate stenosis RIGHT M1 segment. Posterior circulation: Loss of LEFT vertebral artery flow related enhancement from the included V3 and, V4 segment. Retrograde flow into distal LEFT V4 segment. Luminal irregularity, patent RIGHT V4 segment. Complete loss of basilar artery flow related enhancement within the mid segment to the basilar tip. Small LEFT posterior communicating artery is present. Thready LEFT P1 and proximal P2 segment with distal occlusion. Robust RIGHT posterior communicating artery with minimal retrograde flow into RIGHT P1 segment. RIGHT posterior cerebral artery is widely patent. No aneurysm. Mild generalized luminal irregularity of the intracranial vessels. IMPRESSION: MRI HEAD: Acute RIGHT basal ganglia infarct. Multiple cerebellar infarcts, small RIGHT occipital lobe encephalomalacia/infarct. Old mesial LEFT thalamus lacunar infarct. Scattered susceptibility artifact in a distribution compatible with chronic hypertension. Mild chronic small vessel ischemic disease. MRA HEAD: Occluded LEFT vertebral artery, occluded basilar artery is which are likely chronic considering posterior circulation distribution old infarcts. Complete circle of  Willis. Occluded LEFT posterior cerebral artery at P2 segment. High-grade stenosis RIGHT A1 segment, patent anterior communicating artery. Moderate stenosis RIGHT M1 segment. General mild luminal regularity of the vessels compatible with atherosclerosis. Electronically Signed   By: Awilda Metro M.D.   On: 02/22/2015 22:50    Microbiology: No results found for this or any previous visit (from the past 240 hour(s)).   Labs: Basic Metabolic Panel: No results for input(s): NA, K, CL, CO2, GLUCOSE, BUN, CREATININE, CALCIUM, MG, PHOS in the last 168 hours. Liver Function Tests: No results for input(s): AST, ALT, ALKPHOS, BILITOT, PROT, ALBUMIN in the last 168 hours. No results for input(s): LIPASE, AMYLASE in the last 168 hours. No results for input(s): AMMONIA in the last 168 hours. CBC: No results for input(s): WBC, NEUTROABS, HGB, HCT, MCV, PLT in the last 168 hours. Cardiac Enzymes: No results for input(s): CKTOTAL, CKMB, CKMBINDEX, TROPONINI in the last 168 hours. BNP: BNP (last 3 results)  Recent Labs  07/21/14 1436  BNP 2554.2*    ProBNP (last 3 results) No results for input(s): PROBNP in the last 8760 hours.  CBG: No results for input(s): GLUCAP in the last 168 hours.     Signed:  CHIU, Scheryl Marten  [facility] 03/07/2015, 6:58 PM

## 2015-03-08 ENCOUNTER — Encounter (HOSPITAL_COMMUNITY): Payer: Self-pay

## 2015-03-08 ENCOUNTER — Ambulatory Visit (HOSPITAL_COMMUNITY): Payer: Medicare Other | Attending: Cardiology

## 2015-03-08 DIAGNOSIS — I5022 Chronic systolic (congestive) heart failure: Secondary | ICD-10-CM | POA: Diagnosis not present

## 2015-03-08 DIAGNOSIS — R0602 Shortness of breath: Secondary | ICD-10-CM | POA: Diagnosis not present

## 2015-03-08 DIAGNOSIS — I110 Hypertensive heart disease with heart failure: Secondary | ICD-10-CM | POA: Insufficient documentation

## 2015-03-08 LAB — MYOCARDIAL PERFUSION IMAGING
CHL CUP NUCLEAR SDS: 0
CHL CUP NUCLEAR SRS: 0
CHL CUP RESTING HR STRESS: 61 {beats}/min
LV sys vol: 49 mL
LVDIAVOL: 104 mL
Peak HR: 71 {beats}/min
RATE: 0.3
SSS: 0
TID: 0.94

## 2015-03-08 MED ORDER — REGADENOSON 0.4 MG/5ML IV SOLN
0.4000 mg | Freq: Once | INTRAVENOUS | Status: AC
  Administered 2015-03-08: 0.4 mg via INTRAVENOUS

## 2015-03-08 MED ORDER — TECHNETIUM TC 99M SESTAMIBI GENERIC - CARDIOLITE
11.0000 | Freq: Once | INTRAVENOUS | Status: AC | PRN
  Administered 2015-03-08: 11 via INTRAVENOUS

## 2015-03-08 MED ORDER — TECHNETIUM TC 99M SESTAMIBI GENERIC - CARDIOLITE
32.3000 | Freq: Once | INTRAVENOUS | Status: AC | PRN
  Administered 2015-03-08: 32.3 via INTRAVENOUS

## 2015-03-13 ENCOUNTER — Other Ambulatory Visit: Payer: Self-pay | Admitting: *Deleted

## 2015-03-13 NOTE — Patient Outreach (Signed)
[facility]) Care Management  03/13/2015  [name] 1945/07/26 [id number]   Transition of care (week 3)  RN attempted to contact daughter [name]) at both number noted via EPIC. RN left HIPAA approved voice message to both contacts requesting a call back for services. Will follow up further with interventions upon contact.   Elliot Cousin, RN Care Management Coordinator [facility] Main Office [phone]

## 2015-03-14 ENCOUNTER — Encounter (HOSPITAL_COMMUNITY): Payer: Self-pay

## 2015-03-14 ENCOUNTER — Other Ambulatory Visit: Payer: Self-pay | Admitting: *Deleted

## 2015-03-14 ENCOUNTER — Ambulatory Visit (HOSPITAL_COMMUNITY)
Admission: RE | Admit: 2015-03-14 | Discharge: 2015-03-14 | Disposition: A | Discharge status: Home / Self Care | Payer: Medicare Other | Source: Ambulatory Visit | Attending: Cardiology | Admitting: Cardiology

## 2015-03-14 VITALS — BP 156/76 | HR 74 | Resp 18 | Wt 147.8 lb

## 2015-03-14 DIAGNOSIS — I639 Cerebral infarction, unspecified: Secondary | ICD-10-CM | POA: Diagnosis not present

## 2015-03-14 DIAGNOSIS — I5022 Chronic systolic (congestive) heart failure: Secondary | ICD-10-CM | POA: Diagnosis not present

## 2015-03-14 DIAGNOSIS — Z953 Presence of xenogenic heart valve: Secondary | ICD-10-CM

## 2015-03-14 DIAGNOSIS — N183 Chronic kidney disease, stage 3 unspecified: Secondary | ICD-10-CM

## 2015-03-14 DIAGNOSIS — I130 Hypertensive heart and chronic kidney disease with heart failure and stage 1 through stage 4 chronic kidney disease, or unspecified chronic kidney disease: Secondary | ICD-10-CM | POA: Diagnosis not present

## 2015-03-14 DIAGNOSIS — Z8674 Personal history of sudden cardiac arrest: Secondary | ICD-10-CM | POA: Diagnosis not present

## 2015-03-14 DIAGNOSIS — I480 Paroxysmal atrial fibrillation: Secondary | ICD-10-CM | POA: Insufficient documentation

## 2015-03-14 DIAGNOSIS — Z87891 Personal history of nicotine dependence: Secondary | ICD-10-CM | POA: Diagnosis not present

## 2015-03-14 DIAGNOSIS — Z8673 Personal history of transient ischemic attack (TIA), and cerebral infarction without residual deficits: Secondary | ICD-10-CM | POA: Insufficient documentation

## 2015-03-14 DIAGNOSIS — Z7901 Long term (current) use of anticoagulants: Secondary | ICD-10-CM | POA: Diagnosis not present

## 2015-03-14 DIAGNOSIS — E785 Hyperlipidemia, unspecified: Secondary | ICD-10-CM | POA: Diagnosis not present

## 2015-03-14 DIAGNOSIS — N189 Chronic kidney disease, unspecified: Secondary | ICD-10-CM | POA: Diagnosis not present

## 2015-03-14 DIAGNOSIS — Z79899 Other long term (current) drug therapy: Secondary | ICD-10-CM | POA: Insufficient documentation

## 2015-03-14 DIAGNOSIS — I272 Other secondary pulmonary hypertension: Secondary | ICD-10-CM | POA: Diagnosis not present

## 2015-03-14 DIAGNOSIS — Z952 Presence of prosthetic heart valve: Secondary | ICD-10-CM | POA: Diagnosis not present

## 2015-03-14 LAB — BASIC METABOLIC PANEL
ANION GAP: 10 (ref 5–15)
BUN: 14 mg/dL (ref 6–20)
CALCIUM: 10 mg/dL (ref 8.9–10.3)
CHLORIDE: 111 mmol/L (ref 101–111)
CO2: 23 mmol/L (ref 22–32)
Creatinine, Ser: 1.44 mg/dL — ABNORMAL HIGH (ref 0.44–1.00)
GFR calc non Af Amer: 37 mL/min — ABNORMAL LOW (ref >60)
GFR, EST AFRICAN AMERICAN: 42 mL/min — AB (ref >60)
GLUCOSE: 107 mg/dL — AB (ref 65–99)
Potassium: 3.6 mmol/L (ref 3.5–5.1)
Sodium: 144 mmol/L (ref 135–145)

## 2015-03-14 MED ORDER — ATORVASTATIN CALCIUM 40 MG PO TABS: 40.0000 mg | ORAL_TABLET | Freq: Every day | ORAL | Status: DC

## 2015-03-14 MED ORDER — FUROSEMIDE 40 MG PO TABS: ORAL_TABLET | ORAL | Status: DC

## 2015-03-14 MED ORDER — HYDRALAZINE HCL 50 MG PO TABS: 50.0000 mg | ORAL_TABLET | Freq: Three times a day (TID) | ORAL | Status: DC

## 2015-03-14 NOTE — Patient Outreach (Signed)
[facility]) Care Management  03/14/2015  [name] 1945-10-09 [id number]   Transition of care (week 3/2nd attempt)  RN received a call back from pt's daughter Jerrell Belfast) this morning and RN attempted to return another call however only able to leave a HIPAA approved voice message. RN also included on the voice message for caregiver to provide a convenient time and/or date to contact her that maybe accommodating to her schedule. RN will continue attempts to service this pt further.   Elliot Cousin, RN Care Management Coordinator [facility] Main Office [phone]

## 2015-03-14 NOTE — Patient Instructions (Signed)
Stop Simvastatin  Start Atorvastatin 40 mg daily  Increase Hydralazine to 50 mg Three times a day   Labs today  We will contact you in 4 months to schedule your next appointment.

## 2015-03-15 ENCOUNTER — Other Ambulatory Visit (HOSPITAL_COMMUNITY): Payer: Self-pay | Admitting: *Deleted

## 2015-03-15 ENCOUNTER — Telehealth: Payer: Self-pay | Admitting: Internal Medicine

## 2015-03-15 MED ORDER — FUROSEMIDE 20 MG PO TABS: ORAL_TABLET | ORAL | Status: DC

## 2015-03-15 NOTE — Telephone Encounter (Signed)
Pt seen in HF clinic yesterday. Daughter needed clarification on lasix. States when pt saw Dr. Shirlee Latch yesterday, she was asked to report what dosing pt is currently taking at home. She was confused about whether dosing instructions would need to be altered. I read the change for 40mg  EOD and 20mg  EOD from yesterday's AVS.  Daughter states this was not discussed.  She reports pt currently taking 20mg  lasix daily. Seeks clarification on the medication, wants to make sure pt needs dose adjustment. She is aware I am routing for advice.

## 2015-03-15 NOTE — Telephone Encounter (Signed)
Leave Lasix at 20 mg daily.  I was not trying to change her dose, they told me the wrong dose.  Keep at whatever she has been taking at home as it is adequate.  If she is taking 20 daily, continue 20 daily.  Ask them to bring med bottles to next appointment so we don't have this confusion again.

## 2015-03-15 NOTE — Telephone Encounter (Signed)
LM for daughter to return call

## 2015-03-15 NOTE — Progress Notes (Signed)
Patient ID: [name], female   DOB: May 03, 1945, 70 y.o.   MRN: [id number]  Cardiology: Dr. Rennis Golden HF Cardiology: Dr. Shirlee Latch  70 yo with past medical history of MVR (s/p St. Jude mechanical valve 2000 in setting of endocarditis, replacement with 27mm Edwards Magna valve in 07/2014 due to mechanical valve thrombosis), HTN, paroxysmal atrial fibrillation (on Coumadin), and CKD who presented to [facility] ED on 12/14/2014 for worsening shortness of breath. Echo showed drop in EF from 55-60% in 7/16 to 25-30% in 12/16.  Of note, she had presented in 07/2014 with cardiogenic shock with cardiac arrest requiring emergent repeat MVR with bioprosthetic valve. LHC at that time showed normal coronaries.   During 12/16 admission, she was diuresed with IV Lasix and felt considerably better.  RHC after diuresis showed normal right and left heart filling pressures and preserved cardiac output.    She was admitted in 2/17 with CVA, likely embolic in setting of subtherapeutic INR.  MRA head, however, showed extensive cerebrovascular atherosclerosis.  Aspirin was added to her regimen.  Echo in 2/17 showed EF improved to 50-55%.  The RV was mildly dilated with moderately decreased systolic function.  Lexiscan Cardiolite in 3/17 showed EF 53%, no ischemia or infarction.    She returns for HF follow up. She is here with her daughter.  They do not think that she has much residual impairment from her stroke.  She has some trouble with memory, but had this prior to CVA.  She continues to live alone. No exertional dyspnea or chest pain.  BP is running high today. No orthopnea, PND, lightheadedness, or palpitations.   ECG (12/16): NSR, RBBB, LPFB ECG (01/2015): Sinus Brady 37 bpm, RBBB ECG (3/17): NSR, LAFB, RBBB  Labs (12/16): K 3.7, creatinine 2.18, LFTs normal, TSH normal] Labs (01/18/2015): K 4.5, Creatinine 2.67, BNP 227  Labs (2/17): K 3.75m, creatinine 1.93, TSH normal, HCT 34.9, LDL 107  PMH: 1.  Bacterial endocarditis with St Jude mechanical MVR in 2000.  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   2. Atrial fibrillation: Paroxysmal. She is on amiodarone.  3. HTN 4. Hyperlipidemia 5. CKD 6. Chronic systolic CHF:  Suspect nonischemic cardiomyopathy.  Echo (12/16) with EF 25-30%, moderate LVH, diffuse hypokinesis, normally functioning bioprosthetic mitral valve, PASP 60 mmHg, mildly dilated RV with severely decreased systolic function.  Echo prior to MV surgery in 7/16 showed normal EF.  LHC in 7/16 with normal coronary arteries.  RHC (12/16) with mean RA 1, PA 34/12 mean 21, mean PCWP 9, CI 2.59, PVR 2.7 WU.  Echo (2/17) with EF 50-55%, mildly dilated RV with mildly decreased systolic function, PA systolic pressure 41 mmHg, normal bioprosthetic mitral valve.  Cardiolite (3/17) with EF 53%, no ischemia or infarction.   7. CVA: 2/17.   8. Subclavian stenosis: Carotid dopplers (2/17) with < 39% ICA stenosis but left subclavian steal.   9. Cerebrovascular atherosclerosis on MRA head 2/17.   Social History   Social History  . Marital Status: Divorced    Spouse Name: N/A  . Number of Children: 3  . Years of Education: 14   Occupational History  .       Social History Main Topics  . Smoking status: Former Smoker -- 0.12 packs/day    Types: Cigarettes    Quit date: 01/17/1973  . Smokeless tobacco: Never Used  . Alcohol Use: No  . Drug Use: No  . Sexual Activity: Not Currently  Other Topics Concern  . Not on file   Social History Narrative   Family History  Problem Relation Age of Onset  . Lung cancer Mother   . Heart failure Maternal Grandmother   . Heart failure Maternal Grandfather    ROS: All systems reviewed and negative except as per HPI.   Current Outpatient Prescriptions  Medication Sig Dispense Refill  . amiodarone (PACERONE) 200 MG tablet Take 0.5 tablets (100 mg total) by mouth daily. 90 tablet 2  . aspirin EC 81 MG tablet  Take 1 tablet (81 mg total) by mouth daily. 30 tablet 0  . carvedilol (COREG) 3.125 MG tablet Take 1 tablet (3.125 mg total) by mouth 2 (two) times daily with a meal. 60 tablet 3  . hydrALAZINE (APRESOLINE) 50 MG tablet Take 1 tablet (50 mg total) by mouth every 8 (eight) hours. 90 tablet 3  . isosorbide mononitrate (IMDUR) 30 MG 24 hr tablet Take 1 tablet (30 mg total) by mouth daily. 30 tablet 11  . pantoprazole (PROTONIX) 40 MG tablet Take 1 tablet (40 mg total) by mouth daily. 30 tablet 3  . saccharomyces boulardii (FLORASTOR) 250 MG capsule Take 1 capsule (250 mg total) by mouth 2 (two) times daily. 60 capsule 0  . warfarin (COUMADIN) 2 MG tablet Take 1 tablet by mouth daily or as directed by coumadin clinic (Patient taking differently: Take 1-2 mg by mouth daily. Take 1mg  moonday & Friday, take 2mg  on all other days) 30 tablet 6  . atorvastatin (LIPITOR) 40 MG tablet Take 1 tablet (40 mg total) by mouth daily. 30 tablet 6  . furosemide (LASIX) 20 MG tablet Take 2 tab every other day ALTERNATING with 1 tab every other day 90 tablet 3   No current facility-administered medications for this encounter.   BP 156/76 mmHg  Pulse 74  Resp 18  Wt 147 lb 12 oz (67.019 kg)  SpO2 96% General: NAD Neck: No JVD, no thyromegaly or thyroid nodule.  Lungs: Clear to auscultation bilaterally with normal respiratory effort. CV: Nondisplaced PMI.  Heart bradycardic, regular S1/S2, no S3/S4, 1/6 SEM RUSB.  No peripheral edema.  No carotid bruit.  Normal pedal pulses.  Abdomen: Soft, nontender, no hepatosplenomegaly, no distention.  Skin: Intact without lesions or rashes.  Neurologic: Alert and oriented x 3.  Psych: Normal affect. Extremities: No clubbing or cyanosis.  HEENT: Normal.   Assessment/Plan: 1. Chronic systolic CHF: Echo in 12/16 showed EF in 25-30% range with RV dysfunction and moderate pulmonary hypertension. No significant regionality to the wall motion. EF was 55% prior to 7/16 valve  surgery. Her most recent prior echo was the TEE done prior to valve replacement in 7/16. She had exertional dyspnea for several weeks prior to 12/16 admission. Uncertain etiology and chronicity of cardiomyopathy => it is possible that this could be her post-operative state from 7/16 as she did not have a post-operative echo. She has not had definite ACS symptoms and had no significant CAD on cardiac cath pre-valve surgery in 7/16. RHC after diuresis in 12/16 showed preserved cardiac output and normal filling pressures.  Repeat echo in 2/17 showed improvement in EF back to 50-55%, and Cardiolite in 2/17 showed EF 53% with no ischemia or infarction.  - Continue Lasix 20 mg daily.  BMET today.   - Continue current Coreg.  - Hold off on ACEI/ARB now with elevated creatinine.   - With elevated BP, will increase hydralazine to 50 mg tid + Imdur 30 daily.  -  She is now out of range for ICD.  2. Atrial fibrillation: Paroxysmal. She is in NSR on amiodarone. She is on warfarin. Recent LFTs/TSH normal.  Will need regular eye exams with amiodarone use. She had a recent CVA in the setting of subtherapeutic INR.  Will need to be very careful in the future to keep INR in therapeutic range.  3. CKD stage III: Most recent creatinine 1.93. Check BMET today.  4. S/p redo MV replacement: Mechanical MV thrombosis in 7/16, now with bioprosthetic MV. The valve appeared to be functioning properly on 2/17 echo.  5. CVA: In setting of subtherapeutic INR with paroxysmal atrial fibrillation.  Will be watching INR carefully on warfarin. Also added ASA 81 daily given heavy cerebrovascular atherosclerosis.  6. Hyperlipidemia: Goal LDL < 70.  Stop simvastatin, start atorvastatin 40 daily with lipids/LFTs in 2 months.   Marca Ancona 03/15/2015

## 2015-03-15 NOTE — Telephone Encounter (Signed)
NeW Message  Pt dtr calling to clarify pt's dosage of Lasix. Please call back and discuss.

## 2015-03-17 ENCOUNTER — Ambulatory Visit (INDEPENDENT_AMBULATORY_CARE_PROVIDER_SITE_OTHER): Payer: Medicare Other | Admitting: Pharmacist Clinician (PhC)/ Clinical Pharmacy Specialist

## 2015-03-17 ENCOUNTER — Other Ambulatory Visit: Payer: Self-pay | Admitting: *Deleted

## 2015-03-17 DIAGNOSIS — Z953 Presence of xenogenic heart valve: Secondary | ICD-10-CM

## 2015-03-17 DIAGNOSIS — Z7901 Long term (current) use of anticoagulants: Secondary | ICD-10-CM | POA: Diagnosis not present

## 2015-03-17 LAB — POCT INR: INR: 1.7

## 2015-03-17 MED ORDER — FUROSEMIDE 20 MG PO TABS: 20.0000 mg | ORAL_TABLET | Freq: Every day | ORAL | Status: DC

## 2015-03-17 NOTE — Telephone Encounter (Signed)
Left message to call back  

## 2015-03-17 NOTE — Patient Outreach (Addendum)
[facility]) Care Management  03/17/2015  [name] 19-Nov-1945 [id number]  RN attempted outreach call to the requested party [name] (daughter) however unsuccessful. RN able to leave a HIPAA approved voice message requesting a call back. Will await a respond and continue attempts to reach caregiver for this pt for possible [facility] services.  Note several attempts made to reach this caregiver.  Elliot Cousin, RN Care Management Coordinator [facility] Main Office [phone]

## 2015-03-17 NOTE — Telephone Encounter (Signed)
Pt's daughter aware and will have her continue lasix 20 mg daily

## 2015-03-24 ENCOUNTER — Encounter: Payer: Self-pay | Admitting: *Deleted

## 2015-03-28 ENCOUNTER — Ambulatory Visit (INDEPENDENT_AMBULATORY_CARE_PROVIDER_SITE_OTHER): Payer: Medicare Other | Admitting: Internal Medicine

## 2015-03-28 ENCOUNTER — Encounter: Payer: Self-pay | Admitting: Internal Medicine

## 2015-03-28 ENCOUNTER — Other Ambulatory Visit (INDEPENDENT_AMBULATORY_CARE_PROVIDER_SITE_OTHER): Payer: Medicare Other

## 2015-03-28 VITALS — BP 162/102 | HR 72 | Temp 98.9°F | Resp 16 | Ht 64.0 in | Wt 149.8 lb

## 2015-03-28 DIAGNOSIS — I679 Cerebrovascular disease, unspecified: Secondary | ICD-10-CM

## 2015-03-28 DIAGNOSIS — I639 Cerebral infarction, unspecified: Secondary | ICD-10-CM

## 2015-03-28 DIAGNOSIS — N183 Chronic kidney disease, stage 3 (moderate): Secondary | ICD-10-CM

## 2015-03-28 DIAGNOSIS — N184 Chronic kidney disease, stage 4 (severe): Secondary | ICD-10-CM

## 2015-03-28 DIAGNOSIS — Z0000 Encounter for general adult medical examination without abnormal findings: Secondary | ICD-10-CM

## 2015-03-28 DIAGNOSIS — I10 Essential (primary) hypertension: Secondary | ICD-10-CM

## 2015-03-28 DIAGNOSIS — E785 Hyperlipidemia, unspecified: Secondary | ICD-10-CM

## 2015-03-28 LAB — COMPREHENSIVE METABOLIC PANEL
ALBUMIN: 4 g/dL (ref 3.5–5.2)
ALT: 23 U/L (ref 0–35)
AST: 26 U/L (ref 0–37)
Alkaline Phosphatase: 91 U/L (ref 39–117)
BILIRUBIN TOTAL: 1.1 mg/dL (ref 0.2–1.2)
BUN: 13 mg/dL (ref 6–23)
CALCIUM: 10.2 mg/dL (ref 8.4–10.5)
CO2: 33 mEq/L — ABNORMAL HIGH (ref 19–32)
CREATININE: 1.48 mg/dL — AB (ref 0.40–1.20)
Chloride: 106 mEq/L (ref 96–112)
GFR: 45.16 mL/min — ABNORMAL LOW (ref >60.00)
Glucose, Bld: 94 mg/dL (ref 70–99)
Potassium: 3.3 mEq/L — ABNORMAL LOW (ref 3.5–5.1)
Sodium: 144 mEq/L (ref 135–145)
TOTAL PROTEIN: 7.6 g/dL (ref 6.0–8.3)

## 2015-03-28 NOTE — Patient Instructions (Addendum)
We would like you to work on decreasing the salt in your diet. You should not have more than 3 grams of salt in your food (this is 3000 mg of salt).   We also want you to start working on exercising at least 3 times per week. Start with walking about 5 minutes and build up until you are able to walk for 30 minutes without stopping.   We are checking the labs today to make sure that the kidneys are still doing well.   Low-Sodium Eating Plan Sodium raises blood pressure and causes water to be held in the body. Getting less sodium from food will help lower your blood pressure, reduce any swelling, and protect your heart, liver, and kidneys. We get sodium by adding salt (sodium chloride) to food. Most of our sodium comes from canned, boxed, and frozen foods. Restaurant foods, fast foods, and pizza are also very high in sodium. Even if you take medicine to lower your blood pressure or to reduce fluid in your body, getting less sodium from your food is important. WHAT IS MY PLAN? Most people should limit their sodium intake to 3000 mg a day.   WHAT DO I NEED TO KNOW ABOUT THIS EATING PLAN? For the low-sodium eating plan, you will follow these general guidelines:  Choose foods with a % Daily Value for sodium of less than 5% (as listed on the food label).   Use salt-free seasonings or herbs instead of table salt or sea salt.   Check with your health care provider or pharmacist before using salt substitutes.   Eat fresh foods.  Eat more vegetables and fruits.  Limit canned vegetables. If you do use them, rinse them well to decrease the sodium.   Limit cheese to 1 oz (28 g) per day.   Eat lower-sodium products, often labeled as "lower sodium" or "no salt added."  Avoid foods that contain monosodium glutamate (MSG). MSG is sometimes added to [place] food and some canned foods.  Check food labels (Nutrition Facts labels) on foods to learn how much sodium is in one serving.  Eat more  home-cooked food and less restaurant, buffet, and fast food.  When eating at a restaurant, ask that your food be prepared with less salt, or no salt if possible.  HOW DO I READ FOOD LABELS FOR SODIUM INFORMATION? The Nutrition Facts label lists the amount of sodium in one serving of the food. If you eat more than one serving, you must multiply the listed amount of sodium by the number of servings. Food labels may also identify foods as:  Sodium free--Less than 5 mg in a serving.  Very low sodium--35 mg or less in a serving.  Low sodium--140 mg or less in a serving.  Light in sodium--50% less sodium in a serving. For example, if a food that usually has 300 mg of sodium is changed to become light in sodium, it will have 150 mg of sodium.  Reduced sodium--25% less sodium in a serving. For example, if a food that usually has 400 mg of sodium is changed to reduced sodium, it will have 300 mg of sodium. WHAT FOODS CAN I EAT? Grains Low-sodium cereals, including oats, puffed wheat and rice, and shredded wheat cereals. Low-sodium crackers. Unsalted rice and pasta. Lower-sodium bread.  Vegetables Frozen or fresh vegetables. Low-sodium or reduced-sodium canned vegetables. Low-sodium or reduced-sodium tomato sauce and paste. Low-sodium or reduced-sodium tomato and vegetable juices.  Fruits Fresh, frozen, and canned fruit. Fruit juice.  Meat and Other Protein Products Low-sodium canned tuna and salmon. Fresh or frozen meat, poultry, seafood, and fish. Lamb. Unsalted nuts. Dried beans, peas, and lentils without added salt. Unsalted canned beans. Homemade soups without salt. Eggs.  Dairy Milk. Soy milk. Ricotta cheese. Low-sodium or reduced-sodium cheeses. Yogurt.  Condiments Fresh and dried herbs and spices. Salt-free seasonings. Onion and garlic powders. Low-sodium varieties of mustard and ketchup. Fresh or refrigerated horseradish. Lemon juice.  Fats and Oils Reduced-sodium salad  dressings. Unsalted butter.  Other Unsalted popcorn and pretzels.  The items listed above may not be a complete list of recommended foods or beverages. Contact your dietitian for more options. WHAT FOODS ARE NOT RECOMMENDED? Grains Instant hot cereals. Bread stuffing, pancake, and biscuit mixes. Croutons. Seasoned rice or pasta mixes. Noodle soup cups. Boxed or frozen macaroni and cheese. Self-rising flour. Regular salted crackers. Vegetables Regular canned vegetables. Regular canned tomato sauce and paste. Regular tomato and vegetable juices. Frozen vegetables in sauces. Salted [place] fries. Olives. Rosita Fire. Relishes. Sauerkraut. Salsa. Meat and Other Protein Products Salted, canned, smoked, spiced, or pickled meats, seafood, or fish. Bacon, ham, sausage, hot dogs, corned beef, chipped beef, and packaged luncheon meats. Salt pork. Jerky. Pickled herring. Anchovies, regular canned tuna, and sardines. Salted nuts. Dairy Processed cheese and cheese spreads. Cheese curds. Blue cheese and cottage cheese. Buttermilk.  Condiments Onion and garlic salt, seasoned salt, table salt, and sea salt. Canned and packaged gravies. Worcestershire sauce. Tartar sauce. Barbecue sauce. Teriyaki sauce. Soy sauce, including reduced sodium. Steak sauce. Fish sauce. Oyster sauce. Cocktail sauce. Horseradish that you find on the shelf. Regular ketchup and mustard. Meat flavorings and tenderizers. Bouillon cubes. Hot sauce. Tabasco sauce. Marinades. Taco seasonings. Relishes. Fats and Oils Regular salad dressings. Salted butter. Margarine. Ghee. Bacon fat.  Other Potato and tortilla chips. Corn chips and puffs. Salted popcorn and pretzels. Canned or dried soups. Pizza. Frozen entrees and pot pies.  The items listed above may not be a complete list of foods and beverages to avoid. Contact your dietitian for more information.   This information is not intended to replace advice given to you by your health care  provider. Make sure you discuss any questions you have with your health care provider.   Document Released: 06/15/2001 Document Revised: 01/14/2014 Document Reviewed: 10/28/2012 Elsevier Interactive Patient Education [employer]

## 2015-03-28 NOTE — Progress Notes (Signed)
Pre visit review using our clinic review tool, if applicable. No additional management support is needed unless otherwise documented below in the visit note. 

## 2015-03-28 NOTE — Progress Notes (Signed)
   Subjective:    Patient ID: [name], female    DOB: [birth year], 70 y.o.   MRN: [id number]  HPI The patient is a new 70 YO female coming in for her blood pressure. She has been through a lot of health crises in the last year. Please see A/P for full details and treatment. She has been having CKD stage 4 recently (most recent appears to be CKD stage 3) due to blood pressure (never had medical care routinely for a long time). She just had repeat valve replacement last summer. Denies chest pains or headaches. Had stroke due to sub-therapeutic INR. She does not have any symptoms from the stroke including numbness, weakness, speech change. She does have some memory change which is not new from the stroke. Denies side effects from her medicines. Takes them regularly. Has not had any routine medical care.   PMH, [facility], social history reviewed and updated.   Review of Systems  Constitutional: Negative for fever, chills, activity change, appetite change, fatigue and unexpected weight change.  HENT: Negative.   Eyes: Negative.   Respiratory: Negative for cough, chest tightness, shortness of breath and wheezing.   Cardiovascular: Negative for chest pain, palpitations and leg swelling.  Gastrointestinal: Negative for nausea, abdominal pain, diarrhea, constipation and abdominal distention.  Musculoskeletal: Negative for myalgias, back pain and arthralgias.  Skin: Negative.   Neurological: Negative for dizziness, weakness, light-headedness and headaches.       Memory change  Psychiatric/Behavioral: Negative.       Objective:   Physical Exam  Constitutional: She is oriented to person, place, and time. She appears well-developed and well-nourished.  HENT:  Head: Normocephalic and atraumatic.  Right Ear: External ear normal.  Left Ear: External ear normal.  Eyes: EOM are normal.  Neck: Normal range of motion. No JVD present.  Cardiovascular: Normal rate.   Click noted  Pulmonary/Chest:  Effort normal and breath sounds normal. No respiratory distress. She has no wheezes. She has no rales.  Abdominal: Soft. Bowel sounds are normal. She exhibits no distension. There is no tenderness. There is no rebound.  Musculoskeletal: She exhibits no edema.  Neurological: She is alert and oriented to person, place, and time. Coordination normal.  Skin: Skin is warm and dry.  Psychiatric: She has a normal mood and affect.   Filed Vitals:   03/28/15 0907 03/28/15 0949  BP: 182/100 162/102  Pulse: 72   Temp: 98.9 F (37.2 C)   TempSrc: Oral   Resp: 16   Height: 5\' 4"  (1.626 m)   Weight: 149 lb 12.8 oz (67.949 kg)   SpO2: 96%       Assessment & Plan:

## 2015-03-29 LAB — HEPATITIS C ANTIBODY: HCV AB: NEGATIVE

## 2015-03-29 NOTE — Assessment & Plan Note (Signed)
I would suspect that this is from long term poorly controlled hypertension (limited health care in her lifetime). Talked to her about the importance of good BP control. Some memory impairment which could be related.

## 2015-03-29 NOTE — Assessment & Plan Note (Signed)
She has been increased therapy from last cardiologist visit and has tried to make that change yet. Talked to her about the seriousness of her kidney disease and explained the etiology of how high blood pressure damages the kidneys and heart. She is taking imdur, amiodarone, coreg, lasix, hydralazine, spironolactone. Her last Cr is better and may be worth retrial of ACE-I or ARB if stable in 1 month. She would benefit from that for prevention of progression. She does need increased therapy but does not want to make more changes today. Talked to her about the need to exercise 30 minutes per day 5 days per week and she will work up to that. Not exercising at all right now. She is also going to decrease her salt to 2500 mg daily to help with pressures. Needs close follow up with [place]. If still CKD stage 4 on labs today (instead of the recent lab with CKD stage 3) needs nephrologist.

## 2015-03-29 NOTE — Assessment & Plan Note (Signed)
Would set goal LDL <100 or <70 if possible. She recently had increase in therapy to lipitor 40 mg daily and no side effects from that. Given her stroke would push for aggressive management.

## 2015-03-29 NOTE — Assessment & Plan Note (Signed)
Most recent lab with CKD stage 3 but will recheck today to see if that increase in GFR is maintained. BP is not at goal and talked with them about the importance of that. She does not have diabetes. Not on ACE-I or ARB and should start that at next visit if GFR maintained.

## 2015-03-29 NOTE — Assessment & Plan Note (Signed)
Talked to her about the importance of going to all coumadin clinic visits and informing MD of any new meds or antibiotics as they can affect the levels.

## 2015-03-31 ENCOUNTER — Encounter: Payer: Medicare Other | Admitting: Pharmacist Clinician (PhC)/ Clinical Pharmacy Specialist

## 2015-04-04 ENCOUNTER — Ambulatory Visit (INDEPENDENT_AMBULATORY_CARE_PROVIDER_SITE_OTHER): Payer: Medicare Other | Admitting: Pharmacist Clinician (PhC)/ Clinical Pharmacy Specialist

## 2015-04-04 DIAGNOSIS — Z953 Presence of xenogenic heart valve: Secondary | ICD-10-CM

## 2015-04-04 DIAGNOSIS — Z7901 Long term (current) use of anticoagulants: Secondary | ICD-10-CM | POA: Diagnosis not present

## 2015-04-04 DIAGNOSIS — Z954 Presence of other heart-valve replacement: Secondary | ICD-10-CM

## 2015-04-04 DIAGNOSIS — Z952 Presence of prosthetic heart valve: Secondary | ICD-10-CM

## 2015-04-04 LAB — POCT INR: INR: 1.7

## 2015-04-18 ENCOUNTER — Ambulatory Visit (INDEPENDENT_AMBULATORY_CARE_PROVIDER_SITE_OTHER): Payer: Medicare Other | Admitting: Pharmacist Clinician (PhC)/ Clinical Pharmacy Specialist

## 2015-04-18 DIAGNOSIS — Z953 Presence of xenogenic heart valve: Secondary | ICD-10-CM | POA: Diagnosis not present

## 2015-04-18 DIAGNOSIS — Z7901 Long term (current) use of anticoagulants: Secondary | ICD-10-CM

## 2015-04-18 LAB — POCT INR: INR: 2.4

## 2015-04-20 ENCOUNTER — Other Ambulatory Visit: Payer: Self-pay | Admitting: Internal Medicine

## 2015-04-20 NOTE — Telephone Encounter (Signed)
°*  STAT* If patient is at the pharmacy, call can be transferred to refill team.   1. Which medications need to be refilled? (please list name of each medication and dose if known) Pantoprazole -need new prescription  2. Which pharmacy/location (including street and city if local pharmacy) is medication to be sent to?[facility] [phone]  3. Do they need a 30 day or 90 day supply? 30 and refills

## 2015-04-20 NOTE — Telephone Encounter (Signed)
Rx(s) sent to pharmacy electronically.  

## 2015-04-20 NOTE — Telephone Encounter (Signed)
  Pantoprazole refilled #30 with 9 refilled 04/20/15

## 2015-04-25 ENCOUNTER — Encounter: Payer: Self-pay | Admitting: Neurology

## 2015-04-25 ENCOUNTER — Ambulatory Visit (INDEPENDENT_AMBULATORY_CARE_PROVIDER_SITE_OTHER): Payer: Medicare Other | Admitting: Neurology

## 2015-04-25 VITALS — BP 168/80 | HR 64 | Ht 65.0 in | Wt 150.2 lb

## 2015-04-25 DIAGNOSIS — I10 Essential (primary) hypertension: Secondary | ICD-10-CM

## 2015-04-25 DIAGNOSIS — E785 Hyperlipidemia, unspecified: Secondary | ICD-10-CM | POA: Diagnosis not present

## 2015-04-25 DIAGNOSIS — Z954 Presence of other heart-valve replacement: Secondary | ICD-10-CM

## 2015-04-25 DIAGNOSIS — I480 Paroxysmal atrial fibrillation: Secondary | ICD-10-CM

## 2015-04-25 DIAGNOSIS — I679 Cerebrovascular disease, unspecified: Secondary | ICD-10-CM | POA: Diagnosis not present

## 2015-04-25 DIAGNOSIS — Z7901 Long term (current) use of anticoagulants: Secondary | ICD-10-CM | POA: Insufficient documentation

## 2015-04-25 DIAGNOSIS — I63411 Cerebral infarction due to embolism of right middle cerebral artery: Secondary | ICD-10-CM | POA: Diagnosis not present

## 2015-04-25 DIAGNOSIS — I639 Cerebral infarction, unspecified: Secondary | ICD-10-CM

## 2015-04-25 DIAGNOSIS — Z952 Presence of prosthetic heart valve: Secondary | ICD-10-CM

## 2015-04-25 DIAGNOSIS — Z953 Presence of xenogenic heart valve: Secondary | ICD-10-CM | POA: Insufficient documentation

## 2015-04-25 NOTE — Patient Instructions (Signed)
-   continue coumadin and ASA for now for stroke prevention - continue lipitor for stroke prevention - Due to difficulty INR control, stroke with low INR and increased bleeding risk with ASA, we recommend to switch from coumadin to eliquis for better afib management. Although eliquis is not approved by FDA due to your prothestic valve, we recommend you discuss with your cardiologist Dr. Rennis Golden and may need prior authorization before switch.  - check BP at home once a day at least for two weeks and record and bring over to PCP or Dr. Rennis Golden for medication adjustment if needed - Follow up with your primary care physician for stroke risk factor modification. Recommend maintain blood pressure goal <130/80, diabetes with hemoglobin A1c goal below 6.5% and lipids with LDL cholesterol goal below 70 mg/dL.  - follow up in 4 months.

## 2015-04-25 NOTE — Progress Notes (Signed)
STROKE NEUROLOGY FOLLOW UP NOTE  NAME: [name] DOB: Mar 04, 1945  REASON FOR VISIT: stroke follow up HISTORY FROM: daughter and chart and pt  Today we had the pleasure of seeing [name] in follow-up at our Neurology [facility]. Pt was accompanied by daughter.   History Summary [name] is a 70 y.o. 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 MRI showed right BG and right caudate head infarcts, embolic pattern. MRA showed severe posterior athero with BA occlusion, and left VA and P2 occlusion. CUS showed left subclavian steal syndrome. TTE showed EF 50-55% improved from prior 25-30%. LDL 107 and A1C 5.8. Her INR was 1.65 on admission. Her coumadin continued and INR on discharge 2.78. Due to severe posterior athero, she was put on ASA 81 on top of coumadin. Put on lipitor at discharge home.   Interval History During the interval time, the patient has been doing well. No residue symptoms. No recurrent stroke like symptoms. INR still difficulty to control, but last INR 2.4. She is also on ASA without bleeding. BP was high today 168/80 but she did not check BP at home. She is on coreg, lasix, spironolactone, and hydralazine.  REVIEW OF SYSTEMS: Full 14 system review of systems performed and notable only for those listed below and in HPI above, all others are negative:  Constitutional:   Cardiovascular:  Ear/Nose/Throat:   Skin:  Eyes:   Respiratory:   Gastroitestinal:   Genitourinary:  Hematology/Lymphatic:   Endocrine:  Musculoskeletal:   Allergy/Immunology:   Neurological:   Psychiatric:  Sleep:   The following represents the patient's updated allergies and side effects list: Allergies  Allergen Reactions  . Augmentin [Amoxicillin-Pot  Clavulanate] Other (See Comments)    Unknown reaction      The neurologically relevant items on the patient's problem list were reviewed on today's visit.  Neurologic Examination  A problem focused neurological exam (12 or more points of the single system neurologic examination, vital signs counts as 1 point, cranial nerves count for 8 points) was performed.  Blood pressure 168/80, pulse 64, height 5\' 5"  (1.651 m), weight 150 lb 3.2 oz (68.13 kg).  General - Well nourished, well developed, in no apparent distress.  Ophthalmologic - Sharp disc margins OU.   Cardiovascular - Regular rate and rhythm.  Mental Status -  Level of arousal and orientation to time, place, and person were intact. Language including expression, naming, repetition, comprehension was assessed and found intact. Fund of Knowledge was assessed and was intact.  Cranial Nerves II - XII - II - Visual field intact OU. III, IV, VI - Extraocular movements intact. V - Facial sensation intact bilaterally. VII - Facial movement intact bilaterally. VIII - Hearing & vestibular intact bilaterally. X - Palate elevates symmetrically XI - Chin turning & shoulder shrug intact bilaterally. XII - Tongue protrusion intact.  Motor Strength - The patient's strength was normal in all extremities and pronator drift was absent.  Bulk was normal and fasciculations were absent.   Motor Tone - Muscle tone was assessed at the neck and appendages and was normal.  Reflexes - The patient's reflexes were 1+ in all extremities and she had no pathological reflexes.  Sensory - Light touch, temperature/pinprick were assessed and were normal.    Coordination - The patient  had normal movements in the hands and feet with no ataxia or dysmetria.  Tremor was absent.  Gait and Station - The patient's transfers, posture, gait, station, and turns were observed as normal.   Functional score  mRS = 0   0 - No symptoms.   1 - No significant  disability. Able to carry out all usual activities, despite some symptoms.   2 - Slight disability. Able to look after own affairs without assistance, but unable to carry out all previous activities.   3 - Moderate disability. Requires some help, but able to walk unassisted.   4 - Moderately severe disability. Unable to attend to own bodily needs without assistance, and unable to walk unassisted.   5 - Severe disability. Requires constant nursing care and attention, bedridden, incontinent.   6 - Dead.   NIH Stroke Scale   Level Of Consciousness 0=Alert; keenly responsive 1=Not alert, but arousable by minor stimulation 2=Not alert, requires repeated stimulation 3=Responds only with reflex movements 0  LOC Questions to Month and Age 75=Answers both questions correctly 1=Answers one question correctly 2=Answers neither question correctly 0  LOC Commands      -Open/Close eyes     -Open/close grip 0=Performs both tasks correctly 1=Performs one task correctly 2=Performs neighter task correctly 0  Best Gaze 0=Normal 1=Partial gaze palsy 2=Forced deviation, or total gaze paresis 0  Visual 0=No visual loss 1=Partial hemianopia 2=Complete hemianopia 3=Bilateral hemianopia (blind including cortical blindness) 0  Facial Palsy 0=Normal symmetrical movement 1=Minor paralysis (asymmetry) 2=Partial paralysis (lower face) 3=Complete paralysis (upper and lower face) 0  Motor  0=No drift, limb holds posture for full 10 seconds 1=Drift, limb holds posture, no drift to bed 2=Some antigravity effort, cannot maintain posture, drifts to bed 3=No effort against gravity, limb falls 4=No movement Right Arm 0     Leg 0    Left Arm 0     Leg 0  Limb Ataxia 0=Absent 1=Present in one limb 2=Present in two limbs 0  Sensory 0=Normal 1=Mild to moderate sensory loss 2=Severe to total sensory loss 0  Best Language 0=No aphasia, normal 1=Mild to moderate aphasia 2=Mute, global aphasia 3=Mute,  global aphasia 0  Dysarthria 0=Normal 1=Mild to moderate 2=Severe, unintelligible or mute/anarthric 0  Extinction/Neglect 0=No abnormality 1=Extinction to bilateral simultaneous stimulation 2=Profound neglect 0  Total   0     Data reviewed: I personally reviewed the images and agree with the radiology interpretations.  Ct Head Wo Contrast 02/21/2015 Low-density within the right basal ganglia compatible with age-indeterminate infarct. This is suspicious for acute to subacute infarct. 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   Carotid Doppler  Bilateral: 1-39% ICA stenosis. Right: Vertebral artery flow is antegrade. Left: Vertebral artery retrograde suggesting left subclavian steal.   2D Echocardiogram  - Left ventricle: The cavity size was normal. Systolic function wasnormal. The estimated ejection fraction was in the range of 50%to 55%. Doppler parameters are consistent with abnormal leftventricular relaxation (grade 1 diastolic dysfunction). - Aortic valve: There was mild regurgitation. - Left atrium: The atrium was normal in size. - Right ventricle: The cavity size was mildly dilated. Wallthickness was normal. Systolic function was moderately reduced. - Tricuspid valve: There was moderate regurgitation. - Pulmonic valve: There was moderate regurgitation. - Pulmonary arteries: Systolic pressure was mildly increased. PApeak pressure: 41 mm Hg (S). - Pericardium, extracardiac: There was no pericardial effusion. Impressions: - When compared to the prior study from 12/14/2014 LVEF has improvedfom 25-30% to low normal 50-55%.RVEF is  moderate ly reduced. There is mild pulmonaryhypertension.  MRI and MRA head MRI HEAD: Acute RIGHT basal ganglia infarct. Multiple cerebellar infarcts, small RIGHT occipital lobe encephalomalacia/infarct. Old mesial LEFT thalamus lacunar infarct. Scattered susceptibility artifact in a distribution compatible with chronic  hypertension. Mild chronic small vessel ischemic disease. MRA HEAD: Occluded LEFT vertebral artery, occluded basilar artery is which are likely chronic considering posterior circulation distribution old infarcts. Complete circle of Willis. Occluded LEFT posterior cerebral artery at P2 segment. High-grade stenosis RIGHT A1 segment, patent anterior communicating artery. Moderate stenosis RIGHT M1 segment. General mild luminal regularity of the vessels compatible with atherosclerosis.  Component     Latest Ref Rng 02/21/2015 02/22/2015 02/23/2015  Cholesterol     0 - 200 mg/dL  478   Triglycerides     <150 mg/dL  33   HDL Cholesterol     >40 mg/dL  67   Total CHOL/HDL Ratio       2.7   VLDL     0 - 40 mg/dL  7   LDL (calc)     0 - 99 mg/dL  295 (H)   Prothrombin Time     11.6 - 15.2 seconds 19.5 (H) 23.1 (H) 28.9 (H)  INR     0.00 - 1.49 1.65 (H) 2.06 (H) 2.78 (H)  Hemoglobin A1C     4.8 - 5.6 %  5.8 (H)   Mean Plasma Glucose       120     Assessment: As you may recall, she is a 70 y.o. African American female with PMH of MVR with St. 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 MRI showed right BG and right caudate head infarcts, embolic pattern. MRA showed severe posterior athero with BA occlusion, and left VA and P2 occlusion. CUS showed left subclavian steal syndrome. TTE showed EF 50-55% improved from prior 25-30%. LDL 107 and A1C 5.8. Her INR was 1.65 on admission. Her coumadin continued and INR on discharge 2.78. Due to severe posterior athero, she was put on ASA 81 on top of coumadin. During the interval time, the patient has been doing well. INR still difficulty to control, but last INR 2.4. She is also on ASA without bleeding. BP was high today 168/80 but she did not check BP at home.  Plan:  - continue coumadin and ASA for  now for stroke prevention - continue lipitor for stroke prevention - Due to difficulty INR control, stroke with low INR and increased bleeding risk with ASA, we recommend to switch from coumadin to eliquis for better afib management. Although eliquis is not approved by FDA due to prothestic valve, we recommend you discuss with your cardiologist Dr. Rennis Golden and may need prior authorization for the switch.  - check BP at home once a day at least for two weeks and record and bring over to PCP or Dr. Rennis Golden for medication adjustment if needed - Follow up with your primary care physician for stroke risk factor modification. Recommend maintain blood pressure goal <130/80, diabetes with hemoglobin A1c goal below 6.5% and lipids with LDL cholesterol goal below 70 mg/dL.  - follow up in 4 months.  No orders of the defined types were placed in this encounter.     No orders of the defined types were placed in this encounter.    Patient Instructions  - continue coumadin and ASA  for now for stroke prevention - continue lipitor for stroke prevention - Due to difficulty INR control, stroke with low INR and increased bleeding risk with ASA, we recommend to switch from coumadin to eliquis for better afib management. Although eliquis is not approved by FDA due to your prothestic valve, we recommend you discuss with your cardiologist Dr. Rennis Golden and may need prior authorization before switch.  - check BP at home once a day at least for two weeks and record and bring over to PCP or Dr. Rennis Golden for medication adjustment if needed - Follow up with your primary care physician for stroke risk factor modification. Recommend maintain blood pressure goal <130/80, diabetes with hemoglobin A1c goal below 6.5% and lipids with LDL cholesterol goal below 70 mg/dL.  - follow up in 4 months.    Marvel Plan, MD PhD [facility] [address], [address] [place], [place] [address] [phone]

## 2015-05-03 ENCOUNTER — Telehealth: Payer: Self-pay | Admitting: Internal Medicine

## 2015-05-03 NOTE — Telephone Encounter (Signed)
Please advise 

## 2015-05-03 NOTE — Telephone Encounter (Signed)
[facility] - Client TELEPHONE ADVICE RECORD   [facility]    --------------------------------------------------------------------------------   Patient Name: [name]  Gender: Female  DOB: 08-09-1945   Age: 70 Y 7 M 30 D  Return Phone Number: [phone] (Primary), [phone] (Secondary)  Address:     City/State/Zip:  [place]     [facility] - [facility]  Client Site [facility]  Physician Kollar-Crawford, Elizabeth   Contact Type Call  Who Is Calling Patient / Member / Family / Caregiver  Call Type Triage / Clinical  Caller Name Jerrell Belfast  Relationship To Patient Daughter  Return Phone Number [phone] (Primary)  Chief Complaint Foot Pain  Reason for Call Symptomatic / Request for Health Information  Initial Comment Caller states her mother is having pain in her foot that is becoming worse the past few days.  Appointment Disposition EMR Appointment Scheduled  Info pasted into Epic Yes  PreDisposition Call Doctor  Translation No       Nurse Assessment  Nurse: Logan Bores, RN, Melissa Date/Time (Eastern Time): 05/03/2015 4:17:44 PM  Confirm and document reason for call. If symptomatic, describe symptoms. You must click the next button to save text entered. ---Caller states her mother is having pain in her foot that is becoming worse the past few days.    Has the patient traveled out of the country within the last 30 days? ---Not Applicable    Does the patient have any new or worsening symptoms? ---Yes    Will a triage be completed? ---Yes    Related visit to physician within the last 2 weeks? ---No    Does the PT have any chronic conditions? (i.e. diabetes, asthma, etc.) ---Yes    List chronic conditions. ---hospitalized for stroke Feb. 2017, heart problems-valve replacement July 2016, hypertension,    Is this a behavioral health or substance abuse call? ---No            Guidelines          Guideline Title Affirmed Question Affirmed Notes Nurse Date/Time (Eastern Time)  Foot Pain Pain in the big toe joint    Evans, RN, Melissa 05/03/2015 4:20:00 PM    Disp. Time Lamount Cohen Time) Disposition Final User    05/03/2015 4:02:07 PM Attempt made - message left   Logan Bores, RN, Efraim Kaufmann      05/03/2015 4:22:59 PM See PCP When Office is Open (within 3 days) Yes Logan Bores, RN, Efraim Kaufmann            Caller Understands: Yes  Disagree/Comply: Comply       Care Advice Given Per Guideline        SEE PCP WITHIN 3 DAYS: * You need to be seen within 2 or 3 days. Call your doctor during regular office hours and make an appointment. An urgent care center is often the best source of care if your doctor's office is closed or you can't get an appointment. NOTE: If office will be open tomorrow, tell caller to call then, not in 3 days. CALL BACK IF: * Fever occurs * You become worse.        --------------------------------------------------------------------------------         Comments  User: Ardeen Garland, RN Date/Time (Eastern Time): 05/03/2015 4:28:34 PM  Caller did not want to see any other practitioner in the office but the physician that she sees Dr. Hillard Danker.    Referrals  REFERRED TO PCP  OFFICE

## 2015-05-04 ENCOUNTER — Ambulatory Visit (INDEPENDENT_AMBULATORY_CARE_PROVIDER_SITE_OTHER)
Admission: RE | Admit: 2015-05-04 | Discharge: 2015-05-04 | Disposition: A | Discharge status: Home / Self Care | Payer: Medicare Other | Source: Ambulatory Visit | Attending: Family | Admitting: Family

## 2015-05-04 ENCOUNTER — Encounter: Payer: Self-pay | Admitting: Family

## 2015-05-04 ENCOUNTER — Ambulatory Visit (INDEPENDENT_AMBULATORY_CARE_PROVIDER_SITE_OTHER): Payer: Medicare Other | Admitting: Family

## 2015-05-04 VITALS — BP 122/78 | HR 69 | Temp 98.8°F | Ht 65.0 in | Wt 147.0 lb

## 2015-05-04 DIAGNOSIS — M79672 Pain in left foot: Secondary | ICD-10-CM

## 2015-05-04 DIAGNOSIS — I639 Cerebral infarction, unspecified: Secondary | ICD-10-CM

## 2015-05-04 DIAGNOSIS — M7989 Other specified soft tissue disorders: Secondary | ICD-10-CM | POA: Diagnosis not present

## 2015-05-04 MED ORDER — DICLOFENAC SODIUM 1 % TD GEL: 4.0000 g | Freq: Four times a day (QID) | TRANSDERMAL | Status: DC

## 2015-05-04 NOTE — Telephone Encounter (Signed)
It would appear patient was advised to call and schedule visit, I agree.

## 2015-05-04 NOTE — Progress Notes (Signed)
Pre visit review using our clinic review tool, if applicable. No additional management support is needed unless otherwise documented below in the visit note. 

## 2015-05-04 NOTE — Patient Instructions (Signed)
Possible you may have stress fracture. Go to [facility].    Will call with results.

## 2015-05-04 NOTE — Progress Notes (Signed)
Subjective:    Patient ID: [name], female    DOB: 1945-10-19, 70 y.o.   MRN: [id number]   [name] is a 70 y.o. female who presents today for an acute visit.    HPI Comments: Patient here for evaluation of left foot pain and swelling on right big toe that started yesterday.  Improved. Only hurts with weight bearing. Hasnt tried any medications. Rates pain when standing as 5/10. No history of gout. No history of osteoporosis or stress fractures.  Past Medical History  Diagnosis Date  . Hypertension   . Hyperlipidemia   . S/P MVR (mitral valve replacement) 04/11/1998    #29 St. Jude bileaflet mechanical valve for bacterial endocarditis  . 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 S/P redo mitral valve replacement with bioprosthetic valve 07/25/2014    27 mm [facility] Mitral bovine bioprosthetic tissue valve  . Acute CHF (congestive heart failure) (HCC) 12/14/2014  . Stroke [facility])    Augmentin Current Outpatient Prescriptions on File Prior to Visit  Medication Sig Dispense Refill  . amiodarone (PACERONE) 200 MG tablet Take 0.5 tablets (100 mg total) by mouth daily. 90 tablet 2  . aspirin EC 81 MG tablet Take 1 tablet (81 mg total) by mouth daily. 30 tablet 0  . atorvastatin (LIPITOR) 40 MG tablet Take 1 tablet (40 mg total) by mouth daily. 30 tablet 6  . carvedilol (COREG) 3.125 MG tablet Take 1 tablet (3.125 mg total) by mouth 2 (two) times daily with a meal. 60 tablet 3  . furosemide (LASIX) 20 MG tablet Take 1 tablet (20 mg total) by mouth daily. 90 tablet 3  . hydrALAZINE (APRESOLINE) 50 MG tablet Take 1 tablet (50 mg total) by mouth every 8 (eight) hours. 90 tablet 3  . isosorbide mononitrate (IMDUR) 30 MG 24 hr tablet Take 1 tablet (30 mg total)  by mouth daily. 30 tablet 11  . pantoprazole (PROTONIX) 40 MG tablet Take 1 tablet (40 mg total) by mouth daily. 30 tablet 9  . saccharomyces boulardii (FLORASTOR) 250 MG capsule Take 1 capsule (250 mg total) by mouth 2 (two) times daily. 60 capsule 0  . spironolactone (ALDACTONE) 25 MG tablet Take 25 mg by mouth daily.    Marland Kitchen warfarin (COUMADIN) 2 MG tablet Take 1 tablet by mouth daily or as directed by coumadin clinic (Patient taking differently: Take 1-2 mg by mouth daily. Take 1mg  moonday & Friday, take 2mg  on all other days) 30 tablet 6   No current facility-administered medications on file prior to visit.    Social History  Substance Use Topics  . Smoking status: Former Smoker -- 0.12 packs/day    Types: Cigarettes    Quit date: 01/17/1973  . Smokeless tobacco: Never Used  . Alcohol Use: No    Review of Systems  Constitutional: Negative for fever and chills.  Musculoskeletal: Negative for myalgias, joint swelling and gait problem.  Skin: Negative for rash and wound.      Objective:    Ht 5\' 5"  (1.651 m)  Wt 147 lb (66.679 kg)  BMI 24.46 kg/m2   Physical Exam  Constitutional: She appears well-developed and well-nourished.  Eyes: Conjunctivae are normal.  Cardiovascular: Normal rate, regular rhythm, normal heart sounds and normal pulses.   Pulmonary/Chest:  Effort normal and breath sounds normal. She has no wheezes. She has no rhonchi. She has no rales.  Musculoskeletal:       Left foot: There is tenderness. There is normal range of motion, no bony tenderness, no swelling, normal capillary refill, no deformity and no laceration.       Feet:  Left foot:  Patient describes pain on dorsal aspect of foot as noted on diagram. During my exam, there is no swelling, erythema. Mild tenderness appreciated over dorsal aspect of foot. No gross deformity. Full range of motion with doriflexion and plantarflexion.  No erythema, increased warmth. Skin intact.No rash.   No pain  appreciated with palpation of left great toe.   Neurological: She is alert.  Skin: Skin is warm and dry.  Psychiatric: She has a normal mood and affect. Her speech is normal and behavior is normal. Thought content normal.  Vitals reviewed.      Assessment & Plan:   1. Left foot pain Pain of left foot is nonspecific at this time. Pending x-ray to evaluate for stress fracture. I'm reassured that pain is improving today. No erythema or increased warmth to suggest gout.  Trial of topical NSAID as patient has compromised renal function.  - DG Foot Complete Left; Future - diclofenac sodium (VOLTAREN) 1 % GEL; Apply 4 g topically 4 (four) times daily.  Dispense: 1 Tube; Refill: 3   I am having [name] maintain her saccharomyces boulardii, isosorbide mononitrate, warfarin, carvedilol, amiodarone, aspirin EC, atorvastatin, hydrALAZINE, furosemide, spironolactone, and pantoprazole.   No orders of the defined types were placed in this encounter.     Start medications as prescribed and explained to patient on After Visit Summary ( AVS). Risks, benefits, and alternatives of the medications and treatment plan prescribed today were discussed, and patient expressed understanding.   Education regarding symptom management and diagnosis given to patient.   Follow-up:Plan follow-up as discussed or as needed if any worsening symptoms or change in condition. No Follow-up on file.   Continue to follow with Myrlene Broker, MD for routine health maintenance.   [name] and I agreed with plan.   Margaret Plowman, FNP

## 2015-05-05 ENCOUNTER — Telehealth: Payer: Self-pay

## 2015-05-05 NOTE — Telephone Encounter (Signed)
Judeth Cornfield I talked with them and let them know what is going on. They understand and said they would keep an eye on it and let you know they had any issues. Thank you.

## 2015-05-08 ENCOUNTER — Ambulatory Visit: Payer: Self-pay | Admitting: Internal Medicine

## 2015-05-12 ENCOUNTER — Ambulatory Visit (INDEPENDENT_AMBULATORY_CARE_PROVIDER_SITE_OTHER): Payer: Medicare Other | Admitting: Pharmacist Clinician (PhC)/ Clinical Pharmacy Specialist

## 2015-05-12 DIAGNOSIS — Z953 Presence of xenogenic heart valve: Secondary | ICD-10-CM

## 2015-05-12 DIAGNOSIS — Z7901 Long term (current) use of anticoagulants: Secondary | ICD-10-CM

## 2015-05-12 LAB — POCT INR: INR: 2.1

## 2015-05-19 ENCOUNTER — Ambulatory Visit (INDEPENDENT_AMBULATORY_CARE_PROVIDER_SITE_OTHER): Payer: Medicare Other | Admitting: Pharmacist Clinician (PhC)/ Clinical Pharmacy Specialist

## 2015-05-19 ENCOUNTER — Encounter: Payer: Self-pay | Admitting: Internal Medicine

## 2015-05-19 ENCOUNTER — Ambulatory Visit (INDEPENDENT_AMBULATORY_CARE_PROVIDER_SITE_OTHER): Payer: Medicare Other | Admitting: Internal Medicine

## 2015-05-19 VITALS — BP 160/82 | HR 62 | Ht 65.0 in | Wt 148.0 lb

## 2015-05-19 DIAGNOSIS — Z952 Presence of prosthetic heart valve: Secondary | ICD-10-CM

## 2015-05-19 DIAGNOSIS — Z7901 Long term (current) use of anticoagulants: Secondary | ICD-10-CM

## 2015-05-19 DIAGNOSIS — Z953 Presence of xenogenic heart valve: Secondary | ICD-10-CM

## 2015-05-19 DIAGNOSIS — I110 Hypertensive heart disease with heart failure: Secondary | ICD-10-CM | POA: Diagnosis not present

## 2015-05-19 DIAGNOSIS — I639 Cerebral infarction, unspecified: Secondary | ICD-10-CM | POA: Diagnosis not present

## 2015-05-19 DIAGNOSIS — Z954 Presence of other heart-valve replacement: Secondary | ICD-10-CM | POA: Diagnosis not present

## 2015-05-19 DIAGNOSIS — I5022 Chronic systolic (congestive) heart failure: Secondary | ICD-10-CM

## 2015-05-19 DIAGNOSIS — I480 Paroxysmal atrial fibrillation: Secondary | ICD-10-CM

## 2015-05-19 LAB — POCT INR: INR: 2.3

## 2015-05-19 MED ORDER — HYDRALAZINE HCL 50 MG PO TABS: 75.0000 mg | ORAL_TABLET | Freq: Three times a day (TID) | ORAL | Status: DC

## 2015-05-19 NOTE — Patient Instructions (Addendum)
Your physician has recommended you make the following change in your medication: -- INCREASE hydralazine to 75mg  three times daily  -- take one and one-half tablets 3 times daily  Your physician wants you to follow-up in: 6 months with Dr. Rennis Golden. You will receive a reminder letter in the mail two months in advance. If you don't receive a letter, please call our office to schedule the follow-up appointment.

## 2015-05-19 NOTE — Progress Notes (Signed)
OFFICE NOTE  Chief Complaint:  routine follow-up  Primary Care Physician: Myrlene Broker, MD  HPI:  [name] is a pleasant 86 her old female who unfortunately had a spontaneous valvular endocarditis after a pneumonia in 2000. She ultimately had severe mitral regurgitation and destruction of the mitral valve necessitating emergent replacement with a 29 mm mechanical St. Jude valve by Dr. Cornelius Moras. Since that time she's done very well. She continues to exercise and did not get any shortness of breath or chest pain. She also has dyslipidemia and hypertension which is well managed. She is chronically on warfarin therapy which is followed in this office.  Her last echocardiogram to evaluate valvular gradients was in 2011 which showed an EF of  greater than 55%. The gradients across the valve were 9 and 4.9 mm Hg.  I saw [name] back in the office today. She is doing extremely well. She denies any shortness of breath or worsening chest pain. She continues to exercise. Her weight is appropriate. Carina was mildly elevated today and is followed by [name]. Her mitral valve apparently is working well. Her last echocardiogram in 2015 showed stable gradients and normal LV function. She is due for cholesterol reassessment. Of note her blood pressure is elevated today. She says that she's been out of her medicines for a little while and that there were no refills that were authorized, however our system indicates that they are available.  [name] returns today for follow-up from her recent hospitalization. She was admitted for congestive heart failure. An echocardiogram showed an EF of 25-30%. This represents an acute decline from her preoperative EF which was normal this past summer. Unfortunately the summer she developed valve thrombosis and had to have a redo mitral valve surgery. She was doing fairly well for that for while and then developed worsening shortness of breath and  was found to have cardiomyopathy. It's unclear whether her EF drop was perioperative course at some later time. She was diuresed and treated by the advanced heart failure service. She had a right heart catheterization and since her hospitalization has been doing better. She does not appear to have had recurrent atrial fibrillation and is been on amiodarone therapy as well as warfarin anticoagulation. She says she has had a productive cough for the last several weeks after an upper respiratory infection but it is improving. Blood pressure today is noted to be very low at 94/72. Her weight is actually 5 pounds lower than her discharge weight.  05/19/2015  [name] returns today for follow-up. Overall she's been doing fairly well. Blood pressure has been running higher recently. Her hydralazine is at 50 mg 3 times a day. She's also on carvedilol 3125 twice a day however heart rate is in the low 60s. She appears euvolemic and her weight is been stable. She is taking amiodarone without recurrent A. fib. I had a discussion with her neurologist about her anticoagulation. She is interested in her going on to a novel oral anticoagulant, however I stressed the fact that there is little data and no FDA approval for use of those medications in patients with valvular A. fib. I would be concerned about making that switch. I feel that with better attempts at compliance we would be able to get more regulated INR levels. In addition, her bioprosthetic valve is much less likely at this point to thrombose (essentially the warfarin is for a-fib).  PMHx:  Past Medical History  Diagnosis Date  .  Hypertension   . Hyperlipidemia   . S/P MVR (mitral valve replacement) 04/11/1998    #29 St. Jude bileaflet mechanical valve for bacterial endocarditis  . 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 S/P redo mitral valve replacement with bioprosthetic valve 07/25/2014    27 mm [facility] Mitral bovine bioprosthetic tissue valve  . Acute CHF (congestive heart failure) (HCC) 12/14/2014  . Stroke [facility])     Past Surgical History  Procedure Laterality Date  . Mitral valve replacement  04/11/1998    St. Jude mechanical MVR (severe MR, episode of streptococcal bacterial endocarditis - Dr. Cornelius Moras  . Tubal ligation    . Transthoracic echocardiogram  11/2009    EF=>55%; bi-leaflet St. Jude mech prosthesis; mild TR, RVSP elevated at 40-65mmHg, mod pulm HTN; trace AV regurg; mild pulm valve regurg  . Total abdominal hysterectomy w/ bilateral salpingoophorectomy  12/14/1998  . Cardiac catheterization N/A 07/24/2014    Procedure: Right/Left Heart Cath and Coronary Angiography;  Surgeon: Iran Ouch, MD;  Location: [facility] INVASIVE CV LAB;  Service: Cardiovascular;  Laterality: N/A;  . Cardiac catheterization N/A 07/22/2014    Procedure: Fluoroscopy Guidance;  Surgeon: Lennette Bihari, MD;  Location: [facility] INVASIVE CV LAB;  Service: Cardiovascular;  Laterality: N/A;  . Mitral valve replacement N/A 07/25/2014    Procedure: REDO MITRAL VALVE REPLACEMENT (MVR);  Surgeon: Purcell Nails, MD;  Location: [facility] OR;  Service: Open Heart Surgery;  Laterality: N/A;  . Cardiac valve replacement    . Cardiac catheterization N/A 12/16/2014    Procedure: Right Heart Cath;  Surgeon: Laurey Morale, MD;  Location: [facility] INVASIVE CV LAB;  Service: Cardiovascular;  Laterality: N/A;  . Abdominal hysterectomy      FAMHx:  Family History  Problem Relation Age of Onset  . Lung cancer Mother   . Heart failure Maternal Grandmother   . Heart failure Maternal Grandfather   . Stroke Maternal Grandfather   . Stroke Sister     SOCHx:   reports that she quit smoking about 42 years ago. Her smoking use included Cigarettes. She smoked 0.12 packs per day. She  has never used smokeless tobacco. She reports that she does not drink alcohol or use illicit drugs.  ALLERGIES:  Allergies  Allergen Reactions  . Augmentin [Amoxicillin-Pot Clavulanate] Other (See Comments)    Unknown reaction Has patient had a PCN reaction causing immediate rash, facial/tongue/throat swelling, SOB or lightheadedness with hypotension Has patient had a PCN reaction causing severe rash involving mucus membranes or skin necrosis Has patient had a PCN reaction that required hospitalization  Has patient had a PCN reaction occurring within the last 10 years] If all of the above answers are "NO", then may proceed with Cephalosporin use.     ROS: Pertinent items noted in HPI and remainder of comprehensive ROS otherwise negative.  HOME MEDS: Current Outpatient Prescriptions  Medication Sig Dispense Refill  . amiodarone (PACERONE) 200 MG tablet Take 0.5 tablets (100 mg total) by mouth daily. 90 tablet 2  . aspirin EC 81 MG tablet Take 1 tablet (81 mg total) by mouth daily. 30 tablet 0  . atorvastatin (LIPITOR) 40 MG tablet Take 1 tablet (40 mg total) by mouth daily. 30 tablet 6  . carvedilol (COREG) 3.125 MG tablet Take 1 tablet (3.125 mg  total) by mouth 2 (two) times daily with a meal. 60 tablet 3  . furosemide (LASIX) 20 MG tablet Take 1 tablet (20 mg total) by mouth daily. 90 tablet 3  . hydrALAZINE (APRESOLINE) 50 MG tablet Take 1.5 tablets (75 mg total) by mouth every 8 (eight) hours. 135 tablet 3  . isosorbide mononitrate (IMDUR) 30 MG 24 hr tablet Take 1 tablet (30 mg total) by mouth daily. 30 tablet 11  . pantoprazole (PROTONIX) 40 MG tablet Take 1 tablet (40 mg total) by mouth daily. 30 tablet 9  . saccharomyces boulardii (FLORASTOR) 250 MG capsule Take 1 capsule (250 mg total) by mouth 2 (two) times daily. 60 capsule 0  . warfarin (COUMADIN) 2 MG tablet Take 1 tablet by mouth daily or as directed by coumadin clinic (Patient taking differently: Take 1-2 mg by mouth  daily. Take 1mg  moonday & Friday, take 2mg  on all other days) 30 tablet 6   No current facility-administered medications for this visit.    LABS/IMAGING: No results found for this or any previous visit (from the past 48 hour(s)). No results found.  VITALS: BP 160/82 mmHg  Pulse 62  Ht 5\' 5"  (1.651 m)  Wt 148 lb (67.132 kg)  BMI 24.63 kg/m2  EXAM: General appearance: alert and no distress Neck: no carotid bruit and no JVD Lungs: clear to auscultation bilaterally Heart: regular rate and rhythm, S1, S2 normal, there is a mitral click Abdomen: soft, non-tender; bowel sounds normal; no masses,  no organomegaly Extremities: extremities normal, atraumatic, no cyanosis or edema Pulses: 2+ and symmetric Skin: Skin color, texture, turgor normal. No rashes or lesions Neurologic: Grossly normal Psych: Mood, affect normal  EKG: Sinus rhythm with fusion beats at 62, bifascicular block  ASSESSMENT: 1. History of 29 mm St. Jude mechanical mitral valve replacement (2000) for SBE with valve thrombosis and subsequent re-do MVR with a 27 mm Edwards Magna-Ease bioprosthetic valve 2. Acute systolic congestive heart failure-EF 25-30% 3. HTN 4. Dyslipidemia 5. Paroxysmal atrial fibrillation on amiodarone - CHADSVASC score of 6 6. Chronic anticoagulation on warfarin 7. Right bundle branch block 8. Question memory loss/confusion  PLAN: 1.   [name] has been doing well. Her weight is stable. She appears euvolemic on exam. She's been followed at the heart failure clinic. Her mitral valve replacement is stable. She is maintaining sinus rhythm. Blood pressure is elevated today and has been at home for several months ranging in the 150s to 160s. I think she would tolerate an increase in her hydralazine to 75 mg 3 times a day. Her daughter is now living with her and I think this will help with medication compliance as well as INR control. Her INR in the office today was 2.3. This demonstrated stable  control. She'll have a repeat INR in 2 weeks and follow-up of blood pressure at that time. She can follow-up with me in 6 months and she has a follow-up with Dr. Shirlee Latch in the heart failure clinic in July at which time he can determine whether she should continue to follow-up with heart failure clinic or myself.  Chrystie Nose, MD, [facility] Attending Cardiologist [facility]  Chrystie Nose 05/19/2015, 3:18 PM

## 2015-06-09 ENCOUNTER — Ambulatory Visit (INDEPENDENT_AMBULATORY_CARE_PROVIDER_SITE_OTHER): Payer: Medicare Other | Admitting: Pharmacist

## 2015-06-09 DIAGNOSIS — Z953 Presence of xenogenic heart valve: Secondary | ICD-10-CM | POA: Diagnosis not present

## 2015-06-09 DIAGNOSIS — Z7901 Long term (current) use of anticoagulants: Secondary | ICD-10-CM

## 2015-06-09 LAB — POCT INR: INR: 3.6

## 2015-06-12 ENCOUNTER — Other Ambulatory Visit (HOSPITAL_COMMUNITY): Payer: Self-pay | Admitting: Cardiology

## 2015-06-30 ENCOUNTER — Ambulatory Visit (INDEPENDENT_AMBULATORY_CARE_PROVIDER_SITE_OTHER): Payer: Medicare Other | Admitting: Pharmacist Clinician (PhC)/ Clinical Pharmacy Specialist

## 2015-06-30 DIAGNOSIS — Z7901 Long term (current) use of anticoagulants: Secondary | ICD-10-CM | POA: Diagnosis not present

## 2015-06-30 DIAGNOSIS — Z953 Presence of xenogenic heart valve: Secondary | ICD-10-CM

## 2015-06-30 LAB — POCT INR: INR: 3.1

## 2015-07-28 ENCOUNTER — Ambulatory Visit (INDEPENDENT_AMBULATORY_CARE_PROVIDER_SITE_OTHER): Payer: Medicare Other | Admitting: Pharmacist

## 2015-07-28 DIAGNOSIS — Z952 Presence of prosthetic heart valve: Secondary | ICD-10-CM

## 2015-07-28 DIAGNOSIS — Z953 Presence of xenogenic heart valve: Secondary | ICD-10-CM

## 2015-07-28 DIAGNOSIS — Z7901 Long term (current) use of anticoagulants: Secondary | ICD-10-CM

## 2015-07-28 DIAGNOSIS — Z954 Presence of other heart-valve replacement: Secondary | ICD-10-CM

## 2015-07-28 LAB — POCT INR: INR: 3.5

## 2015-08-14 ENCOUNTER — Ambulatory Visit (INDEPENDENT_AMBULATORY_CARE_PROVIDER_SITE_OTHER): Payer: Medicare Other | Admitting: Thoracic Surgery (Cardiothoracic Vascular Surgery)

## 2015-08-14 ENCOUNTER — Encounter: Payer: Self-pay | Admitting: Thoracic Surgery (Cardiothoracic Vascular Surgery)

## 2015-08-14 VITALS — BP 133/60 | HR 50 | Resp 16 | Ht 65.0 in | Wt 135.0 lb

## 2015-08-14 DIAGNOSIS — I050 Rheumatic mitral stenosis: Secondary | ICD-10-CM | POA: Diagnosis not present

## 2015-08-14 DIAGNOSIS — I639 Cerebral infarction, unspecified: Secondary | ICD-10-CM

## 2015-08-14 DIAGNOSIS — Z953 Presence of xenogenic heart valve: Secondary | ICD-10-CM

## 2015-08-14 NOTE — Patient Instructions (Signed)

## 2015-08-14 NOTE — Progress Notes (Signed)
[address]       [place] [address]             [phone]     CARDIOTHORACIC SURGERY OFFICE NOTE  Referring Provider is Jake Bathe, MD Primary Cardiologist is Chrystie Nose, MD PCP is Myrlene Broker, MD   HPI:  Patient is a 70 year old African-American female with multiple medical problems who returns to our office today for routine follow-up approximately one year status post emergency redo mitral valve replacement using a bioprosthetic tissue valve on 07/25/2014 for prosthetic valve dysfunction secondary to valve thrombosis having previously undergone mechanical mitral valve replacement in the remote past. The patient's postoperative recovery was remarkably uncomplicated under the circumstances despite the fact that she presented in cardiogenic shock with cardiac arrest. She did develop postoperative atrial fibrillation for which she was treated with amiodarone. Early follow-up echocardiograms revealed severe lobe left ventricular systolic dysfunction, but this recovered. She was last seen here in our office on 11/07/2014 at which time she was doing fairly well. Since that time she has been followed intermittently in the advanced heart failure clinic and by Dr. Rennis Golden at [facility].  She has been maintaining sinus rhythm. In February of this year she suffered an embolic stroke.  She was subtherapeutic on warfarin with INR 1.65 at the time of admission.  She has been anticoagulated using both warfarin and 81 mg of aspirin since that time. She was last seen in follow-up by Dr. Rennis Golden on 05/19/2015. She returns to our office for routine follow-up today. She states that she has mostly recovered from her stroke. She still has mild weakness in her right forearm and hand. She is right handed and has difficulty riding. She still has some confusion. She denies any symptoms of exertional shortness of breath or chest discomfort. She states that she is able to do for  the most part everything that she wants to do, although she claims that her daughter feels like she does not exercise enough.   Current Outpatient Prescriptions  Medication Sig Dispense Refill  . amiodarone (PACERONE) 200 MG tablet Take 0.5 tablets (100 mg total) by mouth daily. 90 tablet 2  . aspirin EC 81 MG tablet Take 1 tablet (81 mg total) by mouth daily. 30 tablet 0  . atorvastatin (LIPITOR) 40 MG tablet Take 1 tablet (40 mg total) by mouth daily. 30 tablet 6  . carvedilol (COREG) 3.125 MG tablet TAKE 1 TABLET BY MOUTH 2 TIMES DAILY WITH A MEAL 60 tablet PRN  . furosemide (LASIX) 20 MG tablet Take 1 tablet (20 mg total) by mouth daily. 90 tablet 3  . hydrALAZINE (APRESOLINE) 50 MG tablet Take 1.5 tablets (75 mg total) by mouth every 8 (eight) hours. 135 tablet 3  . isosorbide mononitrate (IMDUR) 30 MG 24 hr tablet Take 1 tablet (30 mg total) by mouth daily. 30 tablet 11  . pantoprazole (PROTONIX) 40 MG tablet Take 1 tablet (40 mg total) by mouth daily. 30 tablet 9  . saccharomyces boulardii (FLORASTOR) 250 MG capsule Take 1 capsule (250 mg total) by mouth 2 (two) times daily. 60 capsule 0  . warfarin (COUMADIN) 2 MG tablet Take 1 tablet by mouth daily or as directed by coumadin clinic (Patient taking differently: Take 1-2 mg by mouth daily. Take  moonday & Friday, take  on all other days) 30 tablet 6   No current facility-administered medications for this visit.  Physical Exam:   BP 133/60   Pulse (!) 50   Resp 16   Ht 5\' 5"  (1.651 m)   Wt 135 lb (61.2 kg)   SpO2 97% Comment: ON RA  BMI 22.47 kg/m   General:  Well-appearing  Chest:   Clear to auscultation  CV:   Regular rate and rhythm without murmur  Incisions:  Well-healed sternotomy scar  Abdomen:  Soft nontender  Extremities:  Warm and well-perfused  Diagnostic Tests:  Transthoracic Echocardiography  Patient:    [name], [name] MR #:       [id number] Study Date: 02/22/2015 Gender:     F Age:         36 Height:     165.1 cm Weight:     61.7 kg BSA:        1.69 m^2 Pt. Status: Room:       5M11C   SONOGRAPHER  Cathie Beams  ATTENDING    Jerald Kief  PERFORMING   [facility], Inpatient  ADMITTING    Madelyn Flavors A  ORDERING     Smith, Rondell A  REFERRING    Katrinka Blazing, Rondell A  cc:  ------------------------------------------------------------------- LV EF: 50% -   55%  ------------------------------------------------------------------- Indications:   carotid images attached to echo in error. They were copied and reported on correctly.   CVA 436.  ------------------------------------------------------------------- History:   PMH:  Mitral valve replacement.  Congestive heart failure.  Risk factors:  Hypertension. Dyslipidemia.  ------------------------------------------------------------------- Study Conclusions  - Left ventricle: The cavity size was normal. Systolic function was   normal. The estimated ejection fraction was in the range of 50%   to 55%. Doppler parameters are consistent with abnormal left   ventricular relaxation (grade 1 diastolic dysfunction). - Aortic valve: There was mild regurgitation. - Left atrium: The atrium was normal in size. - Right ventricle: The cavity size was mildly dilated. Wall   thickness was normal. Systolic function was moderately reduced. - Tricuspid valve: There was moderate regurgitation. - Pulmonic valve: There was moderate regurgitation. - Pulmonary arteries: Systolic pressure was mildly increased. PA   peak pressure: 41 mm Hg (S). - Pericardium, extracardiac: There was no pericardial effusion.  Impressions:  - When compared to the prior study from 12/14/2014 LVEF has improved   fom 25-30% to low normal 50-55%.   RVEF is moderate ly reduced. There is mild pulmonary   hypertension.  Transthoracic echocardiography.  M-mode, complete 2D, spectral Doppler, and color Doppler.  Birthdate:  Patient  birthdate: August 28, 1945.  Age:  Patient is 70 yr old.  Sex:  Gender: female. BMI: 22.6 kg/m^2.  Blood pressure:     100/70  Patient status: Inpatient.  Study date:  Study date: 02/22/2015. Study time: 08:39 AM.  Location:  Echo laboratory.  -------------------------------------------------------------------  ------------------------------------------------------------------- Left ventricle:  Basal inferior and inferolateral hypokinesis. The cavity size was normal. Systolic function was normal. The estimated ejection fraction was in the range of 50% to 55%. Doppler parameters are consistent with abnormal left ventricular relaxation (grade 1 diastolic dysfunction).  ------------------------------------------------------------------- Aortic valve:   Trileaflet; normal thickness leaflets. Mobility was not restricted.  Doppler:  Transvalvular velocity was within the normal range. There was no stenosis. There was mild regurgitation.   ------------------------------------------------------------------- Aorta:  Aortic root: The aortic root was normal in size.  ------------------------------------------------------------------- Mitral valve:  A bioprosthesis sits well in the mitral position. No paravalvular leak. Normal transmitral gradients, unchanged from prior study. Mobility was not restricted.  Doppler:  Transvalvular velocity was within the normal range. There was no evidence for stenosis. There was trivial regurgitation.    Peak gradient (D): 6 mm Hg.  ------------------------------------------------------------------- Left atrium:  The atrium was normal in size.  ------------------------------------------------------------------- Right ventricle:  The cavity size was mildly dilated. Wall thickness was normal. Systolic function was moderately reduced.   ------------------------------------------------------------------- Pulmonic valve:    Structurally normal valve.   Cusp  separation was normal.  Doppler:  Transvalvular velocity was within the normal range. There was no evidence for stenosis. There was moderate regurgitation.  ------------------------------------------------------------------- Tricuspid valve:   Structurally normal valve.    Doppler: Transvalvular velocity was within the normal range. There was moderate regurgitation.  ------------------------------------------------------------------- Pulmonary artery:   The main pulmonary artery was normal-sized. Systolic pressure was mildly increased.  ------------------------------------------------------------------- Right atrium:  The atrium was normal in size.  ------------------------------------------------------------------- Pericardium:  There was no pericardial effusion.  ------------------------------------------------------------------- Systemic veins: Inferior vena cava: The vessel was dilated. The respirophasic diameter changes were blunted (< 50%), consistent with elevated central venous pressure.  ------------------------------------------------------------------- Measurements   Left ventricle                         Value        Reference  LV ID, ED, PLAX chordal        (L)     37.3  mm     43 - 52  LV ID, ES, PLAX chordal                28.7  mm     23 - 38  LV fx shortening, PLAX chordal (L)     23    %      >=29  LV PW thickness, ED                    11    mm     ---------  IVS/LV PW ratio, ED                    0.93         <=1.3    Ventricular septum                     Value        Reference  IVS thickness, ED                      10.2  mm     ---------    LVOT                                   Value        Reference  LVOT ID, S                             17    mm     ---------  LVOT area                              2.27  cm^2   ---------    Aorta  Value        Reference  Aortic root ID, ED                     28    mm      ---------    Left atrium                            Value        Reference  LA ID, A-P, ES                         37    mm     ---------  LA ID/bsa, A-P                         2.19  cm/m^2 <=2.2  LA volume, S                           43.5  ml     ---------  LA volume/bsa, S                       25.8  ml/m^2 ---------  LA volume, ES, 1-p A4C                 45    ml     ---------  LA volume/bsa, ES, 1-p A4C             26.7  ml/m^2 ---------  LA volume, ES, 1-p A2C                 39.8  ml     ---------  LA volume/bsa, ES, 1-p A2C             23.6  ml/m^2 ---------    Mitral valve                           Value        Reference  Mitral E-wave peak velocity            125   cm/s   ---------  Mitral A-wave peak velocity            125   cm/s   ---------  Mitral deceleration time       (H)     426   ms     150 - 230  Mitral peak gradient, D                6     mm Hg  ---------  Mitral E/A ratio, peak                 1            ---------    Pulmonary arteries                     Value        Reference  PA pressure, S, DP             (H)     41    mm Hg  <=30    Tricuspid valve                        Value  Reference  Tricuspid regurg peak velocity         287   cm/s   ---------  Tricuspid peak RV-RA gradient          33    mm Hg  ---------    Systemic veins                         Value        Reference  Estimated CVP                          8     mm Hg  ---------    Right ventricle                        Value        Reference  RV pressure, S, DP             (H)     41    mm Hg  <=30  RV s&', lateral, S                      6.42  cm/s   ---------    Pulmonic valve                         Value        Reference  Pulmonic regurg velocity, ED           136   cm/s   ---------  Pulmonic regurg gradient, ED           7     mm Hg  ---------  Legend: (L)  and  (H)  mark values outside specified reference  range.  ------------------------------------------------------------------- Prepared and Electronically Authenticated by  Tobias [name], M.D. 2017-02-15T14:09:24   Impression:  Patient is doing well from a cardiac standpoint approximately one year status post emergency redo mitral valve replacement for thrombosed mechanical prosthetic valve. She had her valve replaced using a bioprosthetic tissue valve. She has been maintaining sinus rhythm and she denies any symptoms of congestive heart failure. Most recent follow-up echocardiogram reveals essentially normal left ventricular systolic function with normal functioning bioprosthetic tissue valve in the mitral position.  Plan:  The patient will continue to follow-up with Dr. Rennis Golden. We have not recommended any changes to her current medications.  The patient has been reminded regarding the importance of dental hygiene and the lifelong need for antibiotic prophylaxis for all dental cleanings and other related invasive procedures.  In the future she will call and return to see [place] only as needed.  I spent in excess of 15 minutes during the conduct of this office consultation and >50% of this time involved direct face-to-face encounter with the patient for counseling and/or coordination of their care.  Salvatore Decent. Cornelius Moras, MD 08/14/2015 11:02 AM

## 2015-08-28 ENCOUNTER — Ambulatory Visit: Payer: Medicare Other | Admitting: Neurology

## 2015-08-29 ENCOUNTER — Encounter: Payer: Self-pay | Admitting: Neurology

## 2015-09-01 ENCOUNTER — Ambulatory Visit (INDEPENDENT_AMBULATORY_CARE_PROVIDER_SITE_OTHER): Payer: Medicare Other | Admitting: Pharmacist Clinician (PhC)/ Clinical Pharmacy Specialist

## 2015-09-01 DIAGNOSIS — Z7901 Long term (current) use of anticoagulants: Secondary | ICD-10-CM | POA: Diagnosis not present

## 2015-09-01 DIAGNOSIS — Z953 Presence of xenogenic heart valve: Secondary | ICD-10-CM

## 2015-09-01 LAB — POCT INR: INR: 5.5

## 2015-09-08 ENCOUNTER — Ambulatory Visit (INDEPENDENT_AMBULATORY_CARE_PROVIDER_SITE_OTHER): Payer: Medicare Other | Admitting: Pharmacist

## 2015-09-08 DIAGNOSIS — Z7901 Long term (current) use of anticoagulants: Secondary | ICD-10-CM

## 2015-09-08 DIAGNOSIS — Z953 Presence of xenogenic heart valve: Secondary | ICD-10-CM | POA: Diagnosis not present

## 2015-09-08 LAB — POCT INR: INR: 2.5

## 2015-09-22 ENCOUNTER — Ambulatory Visit (INDEPENDENT_AMBULATORY_CARE_PROVIDER_SITE_OTHER): Payer: Medicare Other | Admitting: Pharmacist Clinician (PhC)/ Clinical Pharmacy Specialist

## 2015-09-22 DIAGNOSIS — Z953 Presence of xenogenic heart valve: Secondary | ICD-10-CM

## 2015-09-22 DIAGNOSIS — Z7901 Long term (current) use of anticoagulants: Secondary | ICD-10-CM

## 2015-09-22 LAB — POCT INR: INR: 5

## 2015-09-29 ENCOUNTER — Encounter: Payer: Self-pay | Admitting: Neurology

## 2015-09-29 ENCOUNTER — Ambulatory Visit (INDEPENDENT_AMBULATORY_CARE_PROVIDER_SITE_OTHER): Payer: Medicare Other | Admitting: Neurology

## 2015-09-29 VITALS — BP 142/71 | HR 59 | Ht 65.0 in | Wt 138.0 lb

## 2015-09-29 DIAGNOSIS — I10 Essential (primary) hypertension: Secondary | ICD-10-CM

## 2015-09-29 DIAGNOSIS — I63411 Cerebral infarction due to embolism of right middle cerebral artery: Secondary | ICD-10-CM | POA: Diagnosis not present

## 2015-09-29 DIAGNOSIS — Z953 Presence of xenogenic heart valve: Secondary | ICD-10-CM

## 2015-09-29 DIAGNOSIS — Z7901 Long term (current) use of anticoagulants: Secondary | ICD-10-CM | POA: Diagnosis not present

## 2015-09-29 DIAGNOSIS — I480 Paroxysmal atrial fibrillation: Secondary | ICD-10-CM | POA: Diagnosis not present

## 2015-09-29 DIAGNOSIS — I639 Cerebral infarction, unspecified: Secondary | ICD-10-CM | POA: Diagnosis not present

## 2015-09-29 DIAGNOSIS — I679 Cerebrovascular disease, unspecified: Secondary | ICD-10-CM

## 2015-09-29 NOTE — Progress Notes (Signed)
STROKE NEUROLOGY FOLLOW UP NOTE  NAME: [name] DOB: 1945-11-24  REASON FOR VISIT: stroke follow up HISTORY FROM: daughter and chart and pt  Today we had the pleasure of seeing [name] in follow-up at our [facility]rology [facility]. Pt was accompanied by daughter.   History Summary [name] is a 70 y.o. 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 MRI showed right BG and right caudate head infarcts, embolic pattern. MRA showed severe posterior athero with BA occlusion, and left VA and P2 occlusion. CUS showed left subclavian steal syndrome. TTE showed EF 50-55% improved from prior 25-30%. LDL 107 and A1C 5.8. Her INR was 1.65 on admission. Her coumadin continued and INR on discharge 2.78. Due to severe posterior athero, she was put on ASA 81 on top of coumadin. Put on lipitor at discharge home.   04/25/2015 follow up - the patient has been doing well. No residue symptoms. No recurrent stroke like symptoms. INR still difficulty to control, but last INR 2.4. She is also on ASA without bleeding. BP was high today 168/80 but she did not check BP at home. She is on coreg, lasix, spironolactone, and hydralazine.  Interval History During the interval time, he has been doing well, without back symptoms. Follow up with Dr. Rennis Golden in cardiology, still on Coumadin for anticoagulation. Recent check INR supratherapeutic, INR 5.0. Overall, INR still not in good control. BP much better controlled than before, today 142/71. On multiple blood pressure medications.  REVIEW OF SYSTEMS: Full 14 system review of systems performed and notable only for those listed below and in HPI above, all others are negative:  Constitutional:   Cardiovascular:  Ear/Nose/Throat:   Skin:  Eyes:     Respiratory:   Gastroitestinal:   Genitourinary:  Hematology/Lymphatic:   Endocrine:  Musculoskeletal:   Allergy/Immunology:   Neurological:   Psychiatric:  Sleep:   The following represents the patient's updated allergies and side effects list: Allergies  Allergen Reactions  . Augmentin [Amoxicillin-Pot Clavulanate] Other (See Comments)    Unknown reaction      The neurologically relevant items on the patient's problem list were reviewed on today's visit.  Neurologic Examination  A problem focused neurological exam (12 or more points of the single system neurologic examination, vital signs counts as 1 point, cranial nerves count for 8 points) was performed.  Blood pressure (!) 142/71, pulse (!) 59, height 5\' 5"  (1.651 m), weight 138 lb (62.6 kg).  General - Well nourished, well developed, in no apparent distress.  Ophthalmologic - Sharp disc margins OU.   Cardiovascular - Regular rate and rhythm.  Mental Status -  Level of arousal and orientation to time, place, and person were intact. Language including expression, naming, repetition, comprehension was assessed and found intact. Fund of Knowledge was assessed and was intact.  Cranial Nerves II - XII - II - Visual field intact OU. III, IV, VI - Extraocular movements intact. V - Facial sensation intact bilaterally. VII - Facial movement intact bilaterally. VIII - Hearing & vestibular intact bilaterally. X - Palate elevates symmetrically XI - Chin turning & shoulder shrug intact bilaterally. XII - Tongue protrusion intact.  Motor Strength - The patient's strength was normal in all extremities and pronator drift was absent.  Bulk was normal and fasciculations were absent.  Motor Tone - Muscle tone was assessed at the neck and appendages and was normal.  Reflexes - The patient's reflexes were 1+ in all extremities and she had no pathological reflexes.  Sensory - Light touch, temperature/pinprick were assessed and  were normal.    Coordination - The patient had normal movements in the hands and feet with no ataxia or dysmetria.  Tremor was absent.  Gait and Station - The patient's transfers, posture, gait, station, and turns were observed as normal.   Data reviewed: I personally reviewed the images and agree with the radiology interpretations.  Ct Head Wo Contrast 02/21/2015 Low-density within the right basal ganglia compatible with age-indeterminate infarct. This is suspicious for acute to subacute infarct. 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   Carotid Doppler  Bilateral: 1-39% ICA stenosis. Right: Vertebral artery flow is antegrade. Left: Vertebral artery retrograde suggesting left subclavian steal.   2D Echocardiogram  - Left ventricle: The cavity size was normal. Systolic function wasnormal. The estimated ejection fraction was in the range of 50%to 55%. Doppler parameters are consistent with abnormal leftventricular relaxation (grade 1 diastolic dysfunction). - Aortic valve: There was mild regurgitation. - Left atrium: The atrium was normal in size. - Right ventricle: The cavity size was mildly dilated. Wallthickness was normal. Systolic function was moderately reduced. - Tricuspid valve: There was moderate regurgitation. - Pulmonic valve: There was moderate regurgitation. - Pulmonary arteries: Systolic pressure was mildly increased. PApeak pressure: 41 mm Hg (S). - Pericardium, extracardiac: There was no pericardial effusion. Impressions: - When compared to the prior study from 12/14/2014 LVEF has improvedfom 25-30% to low normal 50-55%.RVEF is moderate ly reduced. There is mild pulmonaryhypertension.  MRI and MRA head MRI HEAD: Acute RIGHT basal ganglia infarct. Multiple cerebellar infarcts, small RIGHT occipital lobe encephalomalacia/infarct. Old mesial LEFT thalamus lacunar infarct. Scattered susceptibility artifact in a distribution compatible with chronic  hypertension. Mild chronic small vessel ischemic disease. MRA HEAD: Occluded LEFT vertebral artery, occluded basilar artery is which are likely chronic considering posterior circulation distribution old infarcts. Complete circle of Willis. Occluded LEFT posterior cerebral artery at P2 segment. High-grade stenosis RIGHT A1 segment, patent anterior communicating artery. Moderate stenosis RIGHT M1 segment. General mild luminal regularity of the vessels compatible with atherosclerosis.  Component     Latest Ref Rng 02/21/2015 02/22/2015 02/23/2015  Cholesterol     0 - 200 mg/dL  782181   Triglycerides     <150 mg/dL  33   HDL Cholesterol     >40 mg/dL  67   Total CHOL/HDL Ratio       2.7   VLDL     0 - 40 mg/dL  7   LDL (calc)     0 - 99 mg/dL  956107 (H)   Prothrombin Time     11.6 - 15.2 seconds 19.5 (H) 23.1 (H) 28.9 (H)  INR     0.00 - 1.49 1.65 (H) 2.06 (H) 2.78 (H)  Hemoglobin A1C     4.8 - 5.6 %  5.8 (H)   Mean Plasma Glucose       120     Assessment: As you may recall, she is a 70 y.o. African American female with PMH of MVR with St. 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 MRI showed right BG and right caudate head infarcts, embolic pattern. MRA showed severe posterior athero with  BA occlusion, and left VA and P2 occlusion. CUS showed left subclavian steal syndrome. TTE showed EF 50-55% improved from prior 25-30%. LDL 107 and A1C 5.8. Her INR was 1.65 on admission. Her coumadin continued and INR on discharge 2.78. Due to severe posterior athero, she was put on ASA 81 on top of coumadin. During the interval time, the patient has been doing well. INR still difficulty to control, last INR 5.0. She is also on ASA without bleeding. BP much better controlled.  Plan:  - continue coumadin and ASA for now for stroke prevention - continue  lipitor for stroke prevention - Due to difficulty INR control, stroke with low INR and increased bleeding risk with ASA, we recommend to switch from coumadin to eliquis or Xarelto for better afib management. Although eliquis or Xarelto is not approved by FDA due to prothestic valve, we recommend you discuss with your cardiologist Dr. Rennis Golden and may need prior authorization for the switch.  - check BP at home - Follow up with your primary care physician for stroke risk factor modification. Recommend maintain blood pressure goal <130/80, diabetes with hemoglobin A1c goal below 6.5% and lipids with LDL cholesterol goal below 70 mg/dL.  - follow up in 6 months.  I spent more than 25 minutes of face to face time with the patient. Greater than 50% of time was spent in counseling and coordination of care. We discussed INR monitoring, anticoagulation choices, and blood pressure monitoring.   No orders of the defined types were placed in this encounter.    No orders of the defined types were placed in this encounter.   Patient Instructions  - continue coumadin and ASA for now for stroke prevention - continue lipitor for stroke prevention - Due to difficulty INR control, stroke with low INR and increased bleeding risk with ASA, we recommend to switch from coumadin to eliquis or Xarelto for better afib management. Although eliquis or Xarelto is not approved by FDA due to prothestic valve, we recommend you discuss with your cardiologist Dr. Rennis Golden and may need prior authorization for the switch.  - check BP at home - Follow up with your primary care physician for stroke risk factor modification. Recommend maintain blood pressure goal <130/80, diabetes with hemoglobin A1c goal below 6.5% and lipids with LDL cholesterol goal below 70 mg/dL.  - follow up in 6 months.    Marvel Plan, MD PhD [facility] [address] [place], [place] [address] [phone]

## 2015-09-29 NOTE — Patient Instructions (Addendum)
-   continue coumadin and ASA for now for stroke prevention - continue lipitor for stroke prevention - Due to difficulty INR control, stroke with low INR and increased bleeding risk with ASA, we recommend to switch from coumadin to eliquis or Xarelto for better afib management. Although eliquis or Xarelto is not approved by FDA due to prothestic valve, we recommend you discuss with your cardiologist Dr. Rennis Golden and may need prior authorization for the switch.  - check BP at home - Follow up with your primary care physician for stroke risk factor modification. Recommend maintain blood pressure goal <130/80, diabetes with hemoglobin A1c goal below 6.5% and lipids with LDL cholesterol goal below 70 mg/dL.  - follow up in 6 months.

## 2015-10-02 ENCOUNTER — Other Ambulatory Visit: Payer: Self-pay | Admitting: Internal Medicine

## 2015-10-02 ENCOUNTER — Other Ambulatory Visit (HOSPITAL_COMMUNITY): Payer: Self-pay | Admitting: Cardiology

## 2015-10-02 NOTE — Telephone Encounter (Signed)
New message       *STAT* If patient is at the pharmacy, call can be transferred to refill team.   1. Which medications need to be refilled? (please list name of each medication and dose if known) hydralazine 50mg  and atorvastatin 40mg   2. Which pharmacy/location (including street and city if local pharmacy) is medication to be sent to? [facility] 3. Do they need a 30 day or 90 day supply? 90 day

## 2015-10-02 NOTE — Telephone Encounter (Signed)
New message  Pt daughter called requesting to speak with RN. Pt daughter states she has called more than once today and the pts pharmacy has called as well. Pt daughter would like a call back to discuss pts meds being sent to the pharmacy. Pt states RN knows what meds she is speaking of. Please call back to discuss

## 2015-10-03 NOTE — Telephone Encounter (Signed)
Rx(s) sent to pharmacy electronically.  

## 2015-10-06 ENCOUNTER — Ambulatory Visit (INDEPENDENT_AMBULATORY_CARE_PROVIDER_SITE_OTHER): Payer: Medicare Other | Admitting: Pharmacist Clinician (PhC)/ Clinical Pharmacy Specialist

## 2015-10-06 DIAGNOSIS — Z953 Presence of xenogenic heart valve: Secondary | ICD-10-CM | POA: Diagnosis not present

## 2015-10-06 DIAGNOSIS — Z7901 Long term (current) use of anticoagulants: Secondary | ICD-10-CM

## 2015-10-06 LAB — POCT INR: INR: 3.1

## 2015-10-20 ENCOUNTER — Ambulatory Visit (INDEPENDENT_AMBULATORY_CARE_PROVIDER_SITE_OTHER): Payer: Medicare Other | Admitting: Pharmacist Clinician (PhC)/ Clinical Pharmacy Specialist

## 2015-10-20 DIAGNOSIS — Z953 Presence of xenogenic heart valve: Secondary | ICD-10-CM

## 2015-10-20 DIAGNOSIS — Z7901 Long term (current) use of anticoagulants: Secondary | ICD-10-CM

## 2015-10-20 LAB — POCT INR: INR: 2.1

## 2015-11-10 ENCOUNTER — Ambulatory Visit (INDEPENDENT_AMBULATORY_CARE_PROVIDER_SITE_OTHER): Payer: Medicare Other | Admitting: Pharmacist

## 2015-11-10 DIAGNOSIS — Z953 Presence of xenogenic heart valve: Secondary | ICD-10-CM

## 2015-11-10 DIAGNOSIS — Z7901 Long term (current) use of anticoagulants: Secondary | ICD-10-CM

## 2015-11-10 LAB — POCT INR: INR: 1.9

## 2015-11-14 ENCOUNTER — Telehealth: Payer: Self-pay | Admitting: Internal Medicine

## 2015-11-14 NOTE — Telephone Encounter (Signed)
Received a call back from patient.She stated she has developed a dry cough.She was wanting to know what to take.Advised she can take a cough syrup with no decongestant.Advised to call PCP if cough does not improve.

## 2015-11-14 NOTE — Telephone Encounter (Signed)
F/u Message ° °Pt daughter returning RN call. Please call back to discuss  °

## 2015-11-14 NOTE — Telephone Encounter (Signed)
New message  Pt's daughter is calling  Pt has dry cough, wants advise for something to give her due to meds

## 2015-11-14 NOTE — Telephone Encounter (Signed)
Returned call to patient no answer.LMTC. 

## 2015-11-27 ENCOUNTER — Other Ambulatory Visit: Payer: Self-pay

## 2015-11-27 MED ORDER — WARFARIN SODIUM 2 MG PO TABS: ORAL_TABLET | ORAL | 6 refills | Status: DC

## 2015-11-28 ENCOUNTER — Other Ambulatory Visit: Payer: Self-pay

## 2015-11-28 ENCOUNTER — Telehealth: Payer: Self-pay | Admitting: Internal Medicine

## 2015-11-28 MED ORDER — WARFARIN SODIUM 2 MG PO TABS: ORAL_TABLET | ORAL | 6 refills | Status: DC

## 2015-11-28 NOTE — Telephone Encounter (Signed)
Pt's dtr calling regarding refill not being called in, I told her it was called in yesterday, verified the pharmacy-we have [facility] and she states it was suppose to go to [facility]

## 2015-11-28 NOTE — Telephone Encounter (Signed)
Warfarin 5mg  was refilled at the wrong pharmacy. Got the okay from [place] to refill medication at the correct pharmacy. Left VM on daughter phone regarding [name] medication refill and pharmacy.

## 2015-11-28 NOTE — Telephone Encounter (Signed)
Pt pharmacy, was in EPIC incorrect. It was refilled yesterday to the wrong pharmacy. Refilled medication to the correct pharmacy.

## 2015-12-08 ENCOUNTER — Ambulatory Visit (INDEPENDENT_AMBULATORY_CARE_PROVIDER_SITE_OTHER): Payer: Medicare Other | Admitting: Pharmacist

## 2015-12-08 DIAGNOSIS — I639 Cerebral infarction, unspecified: Secondary | ICD-10-CM | POA: Diagnosis not present

## 2015-12-08 DIAGNOSIS — Z953 Presence of xenogenic heart valve: Secondary | ICD-10-CM

## 2015-12-08 DIAGNOSIS — Z7901 Long term (current) use of anticoagulants: Secondary | ICD-10-CM | POA: Diagnosis not present

## 2015-12-08 LAB — POCT INR: INR: 2.1

## 2015-12-13 ENCOUNTER — Ambulatory Visit: Payer: Medicare Other | Admitting: Internal Medicine

## 2015-12-22 ENCOUNTER — Ambulatory Visit (INDEPENDENT_AMBULATORY_CARE_PROVIDER_SITE_OTHER): Payer: Medicare Other | Admitting: Internal Medicine

## 2015-12-22 ENCOUNTER — Ambulatory Visit (INDEPENDENT_AMBULATORY_CARE_PROVIDER_SITE_OTHER): Payer: Medicare Other | Admitting: Pharmacist

## 2015-12-22 ENCOUNTER — Encounter: Payer: Self-pay | Admitting: Internal Medicine

## 2015-12-22 VITALS — BP 152/70 | HR 50 | Ht 63.0 in | Wt 129.0 lb

## 2015-12-22 DIAGNOSIS — I4891 Unspecified atrial fibrillation: Secondary | ICD-10-CM

## 2015-12-22 DIAGNOSIS — I639 Cerebral infarction, unspecified: Secondary | ICD-10-CM

## 2015-12-22 DIAGNOSIS — Z953 Presence of xenogenic heart valve: Secondary | ICD-10-CM

## 2015-12-22 DIAGNOSIS — Z79899 Other long term (current) drug therapy: Secondary | ICD-10-CM | POA: Diagnosis not present

## 2015-12-22 DIAGNOSIS — Z7901 Long term (current) use of anticoagulants: Secondary | ICD-10-CM

## 2015-12-22 DIAGNOSIS — Z952 Presence of prosthetic heart valve: Secondary | ICD-10-CM | POA: Diagnosis not present

## 2015-12-22 DIAGNOSIS — N183 Chronic kidney disease, stage 3 unspecified: Secondary | ICD-10-CM

## 2015-12-22 LAB — POCT INR: INR: 2.6

## 2015-12-22 MED ORDER — CLINDAMYCIN HCL 300 MG PO CAPS: 600.0000 mg | ORAL_CAPSULE | ORAL | 0 refills | Status: DC

## 2015-12-22 NOTE — Patient Instructions (Signed)
Your physician has requested that you have an echocardiogram in Feb 2018 @ [address] - [address]. Echocardiography is a painless test that uses sound waves to create images of your heart. It provides your doctor with information about the size and shape of your heart and how well your heart's chambers and valves are working. This procedure takes approximately one hour. There are no restrictions for this procedure.  Your physician has recommended that you have a pulmonary function test @ [facility]. Pulmonary Function Tests are a group of tests that measure how well air moves in and out of your lungs.  Your physician recommends that you schedule a follow-up appointment in SIX MONTHS with Dr. Rennis Golden  Take clindamycin as directed prior to dental cleaning.

## 2015-12-22 NOTE — Progress Notes (Signed)
OFFICE NOTE  Chief Complaint:  routine follow-up  Primary Care Physician: Myrlene Broker, MD  HPI:  Filomena Jungling Purnell Shoemaker is a pleasant 11 her old female who unfortunately had a spontaneous valvular endocarditis after a pneumonia in 2000. She ultimately had severe mitral regurgitation and destruction of the mitral valve necessitating emergent replacement with a 29 mm mechanical St. Jude valve by Dr. Cornelius Moras. Since that time she's done very well. She continues to exercise and did not get any shortness of breath or chest pain. She also has dyslipidemia and hypertension which is well managed. She is chronically on warfarin therapy which is followed in this office.  Her last echocardiogram to evaluate valvular gradients was in 2011 which showed an EF of  greater than 55%. The gradients across the valve were 9 and 4.9 mm Hg.  I saw [name] back in the office today. She is doing extremely well. She denies any shortness of breath or worsening chest pain. She continues to exercise. Her weight is appropriate. Carina was mildly elevated today and is followed by [name]. Her mitral valve apparently is working well. Her last echocardiogram in 2015 showed stable gradients and normal LV function. She is due for cholesterol reassessment. Of note her blood pressure is elevated today. She says that she's been out of her medicines for a little while and that there were no refills that were authorized, however our system indicates that they are available.  Mrs. Purnell Shoemaker returns today for follow-up from her recent hospitalization. She was admitted for congestive heart failure. An echocardiogram showed an EF of 25-30%. This represents an acute decline from her preoperative EF which was normal this past summer. Unfortunately the summer she developed valve thrombosis and had to have a redo mitral valve surgery. She was doing fairly well for that for while and then developed worsening shortness of breath and  was found to have cardiomyopathy. It's unclear whether her EF drop was perioperative course at some later time. She was diuresed and treated by the advanced heart failure service. She had a right heart catheterization and since her hospitalization has been doing better. She does not appear to have had recurrent atrial fibrillation and is been on amiodarone therapy as well as warfarin anticoagulation. She says she has had a productive cough for the last several weeks after an upper respiratory infection but it is improving. Blood pressure today is noted to be very low at 94/72. Her weight is actually 5 pounds lower than her discharge weight.  05/19/2015  Mrs. Purnell Shoemaker returns today for follow-up. Overall she's been doing fairly well. Blood pressure has been running higher recently. Her hydralazine is at 50 mg 3 times a day. She's also on carvedilol 3125 twice a day however heart rate is in the low 60s. She appears euvolemic and her weight is been stable. She is taking amiodarone without recurrent A. fib. I had a discussion with her neurologist about her anticoagulation. She is interested in her going on to a novel oral anticoagulant, however I stressed the fact that there is little data and no FDA approval for use of those medications in patients with valvular A. fib. I would be concerned about making that switch. I feel that with better attempts at compliance we would be able to get more regulated INR levels. In addition, her bioprosthetic valve is much less likely at this point to thrombose (essentially the warfarin is for a-fib).  12/22/2015  Mrs. Purnell Shoemaker was seen today in  follow-up. She reports doing fairly well. Her daughter was accompanying her today and has been helping her with her medications. She's been placing her warfarin into her pill boxes and that is helped with compliance. INRs have recently been very well controlled. She does have valvular A. fib and as previously mentioned, the novel  oral anticoagulants are not FDA approved for valvular atrial fibrillation. I would feel more comfortable if we continue with warfarin. She denies any worsening heart failure symptoms and remains on low-dose furosemide. Blood pressure was mildly elevated today however she did not take her morning medications. She is maintaining sinus rhythm and is had not recurrence of atrial fibrillation on low-dose amiodarone. She is due for repeat thyroid, liver function and pulmonary function tests.  PMHx:  Past Medical History:  Diagnosis Date  . Acute CHF (congestive heart failure) (HCC) 12/14/2014  . Hyperlipidemia   . Hypertension   . 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 S/P MVR (mitral valve replacement) 04/11/1998   #29 St. Jude bileaflet mechanical valve for bacterial endocarditis  . S/P redo mitral valve replacement with bioprosthetic valve 07/25/2014   27 mm [facility] Mitral bovine bioprosthetic tissue valve  . Stroke (HCC)   . 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 Valvular endocarditis 2000   Streptococcus    Past Surgical History:  Procedure Laterality Date  . ABDOMINAL HYSTERECTOMY    . CARDIAC CATHETERIZATION N/A 07/24/2014   Procedure: Right/Left Heart Cath and Coronary Angiography;  Surgeon: Iran Ouch, MD;  Location: [facility] INVASIVE CV LAB;  Service: Cardiovascular;  Laterality: N/A;  . CARDIAC CATHETERIZATION N/A 07/22/2014   Procedure: Fluoroscopy Guidance;  Surgeon: Lennette Bihari, MD;  Location: [facility] INVASIVE CV LAB;  Service: Cardiovascular;  Laterality: N/A;  . CARDIAC CATHETERIZATION N/A 12/16/2014   Procedure: Right Heart Cath;  Surgeon: Laurey Morale, MD;  Location: [facility] INVASIVE CV LAB;  Service: Cardiovascular;  Laterality: N/A;  . CARDIAC VALVE REPLACEMENT    . MITRAL VALVE REPLACEMENT  04/11/1998   St. Jude mechanical MVR (severe MR, episode of  streptococcal bacterial endocarditis - Dr. Cornelius Moras  . MITRAL VALVE REPLACEMENT N/A 07/25/2014   Procedure: REDO MITRAL VALVE REPLACEMENT (MVR);  Surgeon: Purcell Nails, MD;  Location: [facility] OR;  Service: Open Heart Surgery;  Laterality: N/A;  . TOTAL ABDOMINAL HYSTERECTOMY W/ BILATERAL SALPINGOOPHORECTOMY  12/14/1998  . TRANSTHORACIC ECHOCARDIOGRAM  11/2009   EF=>55%; bi-leaflet St. Jude mech prosthesis; mild TR, RVSP elevated at 40-48mmHg, mod pulm HTN; trace AV regurg; mild pulm valve regurg  . TUBAL LIGATION      FAMHx:  Family History  Problem Relation Age of Onset  . Lung cancer Mother   . Heart failure Maternal Grandmother   . Heart failure Maternal Grandfather   . Stroke Maternal Grandfather   . Stroke Father   . Stroke Sister     SOCHx:   reports that she quit smoking about 42 years ago. Her smoking use included Cigarettes. She smoked 0.12 packs per day. She has never used smokeless tobacco. She reports that she does not drink alcohol or use drugs.  ALLERGIES:  Allergies  Allergen Reactions  . Augmentin [Amoxicillin-Pot Clavulanate] Other (See Comments)    Unknown reaction Has patient had a PCN reaction causing immediate rash, facial/tongue/throat swelling, SOB or lightheadedness with hypotension Has patient had a PCN reaction causing severe rash involving mucus membranes or skin necrosis  Has patient had a PCN reaction that required hospitalization  Has patient had a PCN reaction occurring within the last 10 years] If all of the above answers are "NO", then may proceed with Cephalosporin use.     ROS: Pertinent items noted in HPI and remainder of comprehensive ROS otherwise negative.  HOME MEDS: Current Outpatient Prescriptions  Medication Sig Dispense Refill  . amiodarone (PACERONE) 200 MG tablet Take 0.5 tablets (100 mg total) by mouth daily. 90 tablet 2  . aspirin EC 81 MG tablet Take 1 tablet (81 mg total) by mouth daily. 30 tablet 0  . atorvastatin (LIPITOR) 40 MG  tablet TAKE 1 TABLET BY MOUTH DAILY. 30 tablet 6  . carvedilol (COREG) 3.125 MG tablet TAKE 1 TABLET BY MOUTH 2 TIMES DAILY WITH A MEAL 60 tablet PRN  . furosemide (LASIX) 20 MG tablet Take 1 tablet (20 mg total) by mouth daily. 90 tablet 3  . hydrALAZINE (APRESOLINE) 50 MG tablet AKE 1 AND 1/2 TABLETS BY MOUTH EVERY 8 HOURS. 135 tablet 6  . isosorbide mononitrate (IMDUR) 30 MG 24 hr tablet Take 1 tablet (30 mg total) by mouth daily. 30 tablet 11  . pantoprazole (PROTONIX) 40 MG tablet Take 1 tablet (40 mg total) by mouth daily. 30 tablet 9  . saccharomyces boulardii (FLORASTOR) 250 MG capsule Take 1 capsule (250 mg total) by mouth 2 (two) times daily. 60 capsule 0  . warfarin (COUMADIN) 2 MG tablet Take 1 tablet by mouth daily or as directed by coumadin clinic 30 tablet 6  . clindamycin (CLEOCIN) 300 MG capsule Take 2 capsules (600 mg total) by mouth as directed. Take one hour prior to dental work 2 capsule 0   No current facility-administered medications for this visit.     LABS/IMAGING: No results found for this or any previous visit (from the past 48 hour(s)). No results found.  VITALS: BP (!) 152/70   Pulse (!) 50   Ht 5\' 3"  (1.6 m)   Wt 129 lb (58.5 kg)   LMP  (LMP Unknown)   BMI 22.85 kg/m   EXAM: General appearance: alert and no distress Neck: no carotid bruit and no JVD Lungs: clear to auscultation bilaterally Heart: regular rate and rhythm, S1, S2 normal, there is a mitral click Abdomen: soft, non-tender; bowel sounds normal; no masses,  no organomegaly Extremities: extremities normal, atraumatic, no cyanosis or edema Pulses: 2+ and symmetric Skin: Skin color, texture, turgor normal. No rashes or lesions Neurologic: Grossly normal Psych: Mood, affect normal  EKG: Sinus bradycardia 50, right bundle branch block  ASSESSMENT: 1. History of 29 mm St. Jude mechanical mitral valve replacement (2000) for SBE with valve thrombosis and subsequent re-do MVR with a 27 mm  Edwards Magna-Ease bioprosthetic valve 2. Acute systolic congestive heart failure-EF 25-30% 3. HTN 4. Dyslipidemia 5. Paroxysmal atrial fibrillation on amiodarone - CHADSVASC score of 6 6. Chronic anticoagulation on warfarin 7. Right bundle branch block 8. Question memory loss/confusion  PLAN: 1.   [name] is doing well now with more stable INRs recently. She should have rechecks of her warfarin levels no less frequently than every 3 weeks. She reports upcoming dental work. We will provide her with clindamycin 600 mg to use prior to that since she is allergic to Augmentin. She seems stable with regards to her congestive heart failure. She will probably need a repeat echo next year. She is maintaining sinus rhythm on low-dose amiodarone. As mentioned we'll repeat PFTs, thyroid function and liver  function. Follow-up in 6 months.  Chrystie NoseKenneth C. Hilty, MD, [facility] Attending Cardiologist [facility]  Chrystie NoseKenneth C Hilty 12/22/2015, 1:31 PM

## 2015-12-25 ENCOUNTER — Other Ambulatory Visit: Payer: Self-pay | Admitting: Internal Medicine

## 2015-12-25 MED ORDER — AMIODARONE HCL 200 MG PO TABS: 100.0000 mg | ORAL_TABLET | Freq: Every day | ORAL | 3 refills | Status: DC

## 2015-12-25 MED ORDER — ISOSORBIDE MONONITRATE ER 30 MG PO TB24: 30.0000 mg | ORAL_TABLET | Freq: Every day | ORAL | 3 refills | Status: DC

## 2015-12-25 NOTE — Telephone Encounter (Signed)
Rx(s) sent to pharmacy electronically.  

## 2015-12-25 NOTE — Telephone Encounter (Signed)
°*  STAT* If patient is at the pharmacy, call can be transferred to refill team.   1. Which medications need to be refilled? (please list name of each medication and dose if known)amiodarone 200mg  and Isosobide Mononitrate 30 mg   2. Which pharmacy/location (including street and city if local pharmacy) is medication to be sent to?[facility] outpatient pharmacy   3. Do they need a 30 day or 90 day supply? 90

## 2016-01-05 ENCOUNTER — Encounter (HOSPITAL_COMMUNITY): Payer: Medicare Other

## 2016-01-12 ENCOUNTER — Ambulatory Visit (INDEPENDENT_AMBULATORY_CARE_PROVIDER_SITE_OTHER): Payer: Medicare Other | Admitting: Pharmacist

## 2016-01-12 DIAGNOSIS — Z952 Presence of prosthetic heart valve: Secondary | ICD-10-CM | POA: Diagnosis not present

## 2016-01-12 DIAGNOSIS — Z953 Presence of xenogenic heart valve: Secondary | ICD-10-CM | POA: Diagnosis not present

## 2016-01-12 DIAGNOSIS — Z7901 Long term (current) use of anticoagulants: Secondary | ICD-10-CM | POA: Diagnosis not present

## 2016-01-12 LAB — POCT INR: INR: 2.1

## 2016-01-19 ENCOUNTER — Encounter (HOSPITAL_COMMUNITY): Payer: Medicare Other

## 2016-02-02 ENCOUNTER — Ambulatory Visit (INDEPENDENT_AMBULATORY_CARE_PROVIDER_SITE_OTHER): Payer: Medicare Other | Admitting: Pharmacist Clinician (PhC)/ Clinical Pharmacy Specialist

## 2016-02-02 DIAGNOSIS — Z952 Presence of prosthetic heart valve: Secondary | ICD-10-CM

## 2016-02-02 DIAGNOSIS — Z953 Presence of xenogenic heart valve: Secondary | ICD-10-CM | POA: Diagnosis not present

## 2016-02-02 DIAGNOSIS — Z7901 Long term (current) use of anticoagulants: Secondary | ICD-10-CM | POA: Diagnosis not present

## 2016-02-02 LAB — POCT INR: INR: 2.2

## 2016-02-05 ENCOUNTER — Other Ambulatory Visit: Payer: Self-pay | Admitting: Internal Medicine

## 2016-02-05 NOTE — Telephone Encounter (Signed)
°  ° °  1. Which medications need to be refilled? (please list name of each medication and dose if known) amiodarone, 200 mg  2. Which pharmacy/location (including street and city if local pharmacy) is medication to be sent to? [facility] [address] [place], [place] [address] [phone]    3. Do they need a 30 day or 90 day supply?

## 2016-02-05 NOTE — Telephone Encounter (Signed)
   Disp Refills Start End   amiodarone (PACERONE) 200 MG tablet 45 tablet 3 12/25/2015    Sig - Route: Take 0.5 tablets (100 mg total) by mouth daily. - Oral   E-Prescribing Status: Receipt confirmed by pharmacy (12/25/2015 10:54 AM EST)   Pharmacy   [facility] - [place], [place] - [address]   -- refilled for 1 year supply in Dec 2017 -- verified with pharmacy that this prescription is on file  -- patient last picked up Rx 12/25/15 - too early for new Rx since it was Rxed for 90 day supply, per phamacy  LM for patient/dgtr with this info

## 2016-02-06 ENCOUNTER — Telehealth: Payer: Self-pay | Admitting: Internal Medicine

## 2016-02-06 NOTE — Telephone Encounter (Addendum)
New message       Talk to the nurse regarding medications.  Ok to talk to her daughter, [name].

## 2016-02-06 NOTE — Telephone Encounter (Signed)
Spoke with daughter she states that she tried to get a refill of pt's amiodarone and they stated that it is too soon to refill. Notified daughter that pt should be taking 100mg  (0.5 tablet) she states that she has been giving pt a whole tablet and will start giving pt 0.5 tablet when she gets it refilled. She states that she was not notified of that but I reveiewed office not from 05-19-2015 and it looks like pt was taking 0.5 tablet at that time too. daughter states that she will decrease to 0.5 tablet (100ng).

## 2016-02-09 ENCOUNTER — Telehealth: Payer: Self-pay | Admitting: Internal Medicine

## 2016-02-09 NOTE — Telephone Encounter (Signed)
As discussed will wait on provider input if OK to continue pt on amiodarone at her reported dose 200mg  or prescribed dose 100mg  daily.

## 2016-02-09 NOTE — Telephone Encounter (Signed)
She was supposed to take 1/2 tablet (100 mg) amiodarone daily. I'm in the hospital rounding this week - not on vacation!   Dr. Rexene Edison

## 2016-02-09 NOTE — Telephone Encounter (Signed)
New Message  Pt voiced she would like for nurse to give pts daughter a call.  Please f/u

## 2016-02-09 NOTE — Telephone Encounter (Signed)
Patient has enough medication until Dr Rennis Golden gets back from vacation.    Requesting advice (dose clarification) from Cardiologist before decreasing amiodarone dose to 100mg .   No additional intervention from [place] at thine time.

## 2016-02-09 NOTE — Telephone Encounter (Signed)
Spoke to daughter of patient. States pt has 0.5 tablet (100mg ) amiodarone listed as taking, but for as long as she can remember, she's been giving patient a whole tablet (200mg ). States pharmacy won't authorize refill because it's not time to refill medication. Looks like going back to at least May of this year, we have this medication listed as 100mg  daily, but she reports the patient never moved down from a 200mg  dose & has always taken that higher dose.  She needs clarification, and if possible, the OK to have 200mg  daily dose sent to pharmacy so that the med can be filled/covered by insurance. Aware Dr. Rennis Golden out of office today, will seek review.

## 2016-02-13 MED ORDER — AMIODARONE HCL 200 MG PO TABS
100.0000 mg | ORAL_TABLET | Freq: Every day | ORAL | 3 refills | Status: DC
Start: 2016-02-13 — End: 2016-03-08

## 2016-02-13 NOTE — Telephone Encounter (Signed)
Please leave her a message about the medicine and why was she not notified about the change of her medicine.If it is too much to leave,please call her at work and get them to get her.Her work number is [phone].

## 2016-02-13 NOTE — Telephone Encounter (Signed)
Left msg for daughter [name] to call.

## 2016-02-13 NOTE — Telephone Encounter (Signed)
Spoke to daughter at listed cell number. Discussed change recommendations and have sent this refill to pharmacy, w notification pt was taking higher dosage and to please make correction.  Daughter aware to call if new concerns, or if she has further questions.

## 2016-02-13 NOTE — Telephone Encounter (Signed)
Work number caller provided goes to a residential number. Was informed by person who answered not to call this number.

## 2016-02-16 ENCOUNTER — Other Ambulatory Visit: Payer: Self-pay

## 2016-02-16 ENCOUNTER — Ambulatory Visit (HOSPITAL_COMMUNITY): Payer: Medicare Other | Attending: Internal Medicine

## 2016-02-16 DIAGNOSIS — I351 Nonrheumatic aortic (valve) insufficiency: Secondary | ICD-10-CM | POA: Insufficient documentation

## 2016-02-16 DIAGNOSIS — Z952 Presence of prosthetic heart valve: Secondary | ICD-10-CM | POA: Insufficient documentation

## 2016-02-16 DIAGNOSIS — I517 Cardiomegaly: Secondary | ICD-10-CM | POA: Diagnosis not present

## 2016-02-16 DIAGNOSIS — I371 Nonrheumatic pulmonary valve insufficiency: Secondary | ICD-10-CM | POA: Diagnosis not present

## 2016-02-16 DIAGNOSIS — I272 Pulmonary hypertension, unspecified: Secondary | ICD-10-CM | POA: Insufficient documentation

## 2016-02-21 ENCOUNTER — Other Ambulatory Visit: Payer: Self-pay | Admitting: Internal Medicine

## 2016-02-21 MED ORDER — PANTOPRAZOLE SODIUM 40 MG PO TBEC: 40.0000 mg | DELAYED_RELEASE_TABLET | Freq: Every day | ORAL | 10 refills | Status: DC

## 2016-02-21 NOTE — Telephone Encounter (Signed)
Rx(s) sent to pharmacy electronically.  

## 2016-02-21 NOTE — Telephone Encounter (Signed)
New message     *STAT* If patient is at the pharmacy, call can be transferred to refill team.   1. Which medications need to be refilled? (please list name of each medication and dose if known) pantoprazole 40mg   2. Which pharmacy/location (including street and city if local pharmacy) is medication to be sent to? [facility]  3. Do they need a 30 day or 90 day supply? 30

## 2016-02-23 ENCOUNTER — Ambulatory Visit (INDEPENDENT_AMBULATORY_CARE_PROVIDER_SITE_OTHER): Payer: Medicare Other | Admitting: Pharmacist Clinician (PhC)/ Clinical Pharmacy Specialist

## 2016-02-23 ENCOUNTER — Ambulatory Visit (HOSPITAL_COMMUNITY)
Admission: RE | Admit: 2016-02-23 | Discharge: 2016-02-23 | Disposition: A | Discharge status: Home / Self Care | Payer: Medicare Other | Source: Ambulatory Visit | Attending: Internal Medicine | Admitting: Internal Medicine

## 2016-02-23 DIAGNOSIS — I4891 Unspecified atrial fibrillation: Secondary | ICD-10-CM | POA: Diagnosis not present

## 2016-02-23 DIAGNOSIS — Z79899 Other long term (current) drug therapy: Secondary | ICD-10-CM

## 2016-02-23 DIAGNOSIS — Z952 Presence of prosthetic heart valve: Secondary | ICD-10-CM | POA: Diagnosis not present

## 2016-02-23 DIAGNOSIS — J988 Other specified respiratory disorders: Secondary | ICD-10-CM | POA: Insufficient documentation

## 2016-02-23 DIAGNOSIS — Z7901 Long term (current) use of anticoagulants: Secondary | ICD-10-CM | POA: Diagnosis not present

## 2016-02-23 DIAGNOSIS — Z953 Presence of xenogenic heart valve: Secondary | ICD-10-CM | POA: Diagnosis not present

## 2016-02-23 LAB — PULMONARY FUNCTION TEST
DL/VA % PRED: 56 %
DL/VA: 2.66 ml/min/mmHg/L
DLCO UNC % PRED: 38 %
DLCO UNC: 8.9 ml/min/mmHg
FEF 25-75 POST: 1.72 L/s
FEF 25-75 PRE: 1.43 L/s
FEF2575-%Change-Post: 20 %
FEF2575-%PRED-PRE: 84 %
FEF2575-%Pred-Post: 102 %
FEV1-%Change-Post: 6 %
FEV1-%PRED-POST: 97 %
FEV1-%Pred-Pre: 91 %
FEV1-Post: 1.75 L
FEV1-Pre: 1.64 L
FEV1FVC-%CHANGE-POST: 2 %
FEV1FVC-%Pred-Pre: 96 %
FEV6-%CHANGE-POST: 3 %
FEV6-%PRED-POST: 101 %
FEV6-%PRED-PRE: 98 %
FEV6-Post: 2.25 L
FEV6-Pre: 2.17 L
FEV6FVC-%Pred-Post: 104 %
FEV6FVC-%Pred-Pre: 104 %
FVC-%Change-Post: 4 %
FVC-%Pred-Post: 99 %
FVC-%Pred-Pre: 95 %
FVC-Post: 2.28 L
FVC-Pre: 2.19 L
POST FEV6/FVC RATIO: 100 %
PRE FEV1/FVC RATIO: 75 %
Post FEV1/FVC ratio: 77 %
Pre FEV6/FVC Ratio: 100 %
RV % pred: 138 %
RV: 2.93 L
TLC % PRED: 99 %
TLC: 4.86 L

## 2016-02-23 LAB — POCT INR: INR: 2.6

## 2016-02-23 MED ORDER — ALBUTEROL SULFATE (2.5 MG/3ML) 0.083% IN NEBU
2.5000 mg | INHALATION_SOLUTION | Freq: Once | RESPIRATORY_TRACT | Status: AC
Start: 2016-02-23 — End: 2016-02-23
  Administered 2016-02-23: 2.5 mg via RESPIRATORY_TRACT

## 2016-02-26 NOTE — Progress Notes (Signed)
The patient is on amiodarone therapy for atrial fibrillation. This is also on the problem list.  Margaret Nose, MD, [facility] Attending Cardiologist [facility]

## 2016-03-01 ENCOUNTER — Other Ambulatory Visit: Payer: Self-pay | Admitting: *Deleted

## 2016-03-01 DIAGNOSIS — R942 Abnormal results of pulmonary function studies: Secondary | ICD-10-CM

## 2016-03-05 ENCOUNTER — Telehealth: Payer: Self-pay | Admitting: Internal Medicine

## 2016-03-05 NOTE — Telephone Encounter (Signed)
Patient's daughter [name]) is returning the call. [name] takes a lunch break each weekday 1-2 pm and is off work on Fridays. Please call at that time. Thanks.

## 2016-03-05 NOTE — Telephone Encounter (Signed)
Message routed to Dr. Hilty °

## 2016-03-08 NOTE — Telephone Encounter (Signed)
Patient's daughter [name] returned MD call. Dr. Rennis Golden spoke with [name]. All questions answered. Patient will d/c amiodarone per PFT results. Med list updated.

## 2016-03-08 NOTE — Addendum Note (Signed)
Addended by: Lindell Spar on: 03/08/2016 02:19 PM   Modules accepted: Orders

## 2016-03-08 NOTE — Telephone Encounter (Signed)
Dr. Rennis Golden has attempted to contact patient's daughter. No answer.

## 2016-03-15 ENCOUNTER — Ambulatory Visit (INDEPENDENT_AMBULATORY_CARE_PROVIDER_SITE_OTHER): Payer: Medicare Other | Admitting: Pharmacist Clinician (PhC)/ Clinical Pharmacy Specialist

## 2016-03-15 DIAGNOSIS — Z953 Presence of xenogenic heart valve: Secondary | ICD-10-CM

## 2016-03-15 DIAGNOSIS — Z7901 Long term (current) use of anticoagulants: Secondary | ICD-10-CM | POA: Diagnosis not present

## 2016-03-15 DIAGNOSIS — Z952 Presence of prosthetic heart valve: Secondary | ICD-10-CM

## 2016-03-15 LAB — POCT INR: INR: 2

## 2016-03-19 ENCOUNTER — Other Ambulatory Visit: Payer: Self-pay | Admitting: Pharmacist

## 2016-03-19 ENCOUNTER — Other Ambulatory Visit: Payer: Self-pay | Admitting: Internal Medicine

## 2016-03-19 MED ORDER — FUROSEMIDE 20 MG PO TABS: 20.0000 mg | ORAL_TABLET | Freq: Every day | ORAL | 0 refills | Status: DC

## 2016-03-19 NOTE — Telephone Encounter (Signed)
New message   Pt verbalized that she wants a refill from Dr.Hilty but Dr.Hilty did not write the prescription.   Give pt a call to see if it is possible to assist her further

## 2016-03-19 NOTE — Telephone Encounter (Signed)
Left msg to call.

## 2016-03-21 NOTE — Telephone Encounter (Signed)
Left message for pt to call.

## 2016-03-22 NOTE — Telephone Encounter (Signed)
Returned call to [employer]. She states the medication has already - lasix refilled 03/19/16. No further action needed.

## 2016-03-27 ENCOUNTER — Ambulatory Visit (INDEPENDENT_AMBULATORY_CARE_PROVIDER_SITE_OTHER)
Admission: RE | Admit: 2016-03-27 | Discharge: 2016-03-27 | Disposition: A | Discharge status: Home / Self Care | Payer: Medicare Other | Source: Ambulatory Visit | Attending: Pulmonary Disease | Admitting: Pulmonary Disease

## 2016-03-27 ENCOUNTER — Encounter: Payer: Self-pay | Admitting: Pulmonary Disease

## 2016-03-27 ENCOUNTER — Ambulatory Visit: Payer: Medicare Other | Admitting: Neurology

## 2016-03-27 ENCOUNTER — Ambulatory Visit (INDEPENDENT_AMBULATORY_CARE_PROVIDER_SITE_OTHER): Payer: Medicare Other | Admitting: Pulmonary Disease

## 2016-03-27 VITALS — BP 110/80 | HR 52 | Ht 63.0 in | Wt 131.8 lb

## 2016-03-27 DIAGNOSIS — R413 Other amnesia: Secondary | ICD-10-CM | POA: Diagnosis not present

## 2016-03-27 DIAGNOSIS — T462X5A Adverse effect of other antidysrhythmic drugs, initial encounter: Secondary | ICD-10-CM

## 2016-03-27 DIAGNOSIS — J984 Other disorders of lung: Secondary | ICD-10-CM

## 2016-03-27 DIAGNOSIS — R918 Other nonspecific abnormal finding of lung field: Secondary | ICD-10-CM | POA: Diagnosis not present

## 2016-03-27 NOTE — Progress Notes (Signed)
Subjective:    Patient ID: [name], female    DOB: 1945/09/20, 71 y.o.   MRN: [id number]  HPI The patient and her son both deny any breathing problems or dyspnea on exertion. Denies any chronic coughing or wheezing. She may have had a URI around the time of her previous physician visit that prompted her PFT per her son. Denies any chest pain, tightness, or pressure. The patient admits she doesn't exercise or walk on a regular basis. She reports she is able to vacuum her 2 bedroom townhouse, which is all on one floor, at one time without taking a break. No abdominal pain, nausea, or emesis. No fever, chills, or sweats. No headaches, syncope or near syncope. She has had memory difficulties since her previous cardiac arrest. No focal weakness, numbness, or tingling. Patient was previously on Amiodarone but this was discontinued on 3/2 which is confirmed with a phone note conversation after her abnormal pulmonary function tests.   Review of Systems No rashes or bruising. No dysuria or hematuria. An accurate review of systems is unobtainable given patient's problems with memory/recall.    Allergies  Allergen Reactions  . Augmentin [Amoxicillin-Pot Clavulanate] Other (See Comments)    Unknown reaction Has patient had a PCN reaction causing immediate rash, facial/tongue/throat swelling, SOB or lightheadedness with hypotension Has patient had a PCN reaction causing severe rash involving mucus membranes or skin necrosis Has patient had a PCN reaction that required hospitalization  Has patient had a PCN reaction occurring within the last 10 years] If all of the above answers are "NO", then may proceed with Cephalosporin use.     Current Outpatient Prescriptions on File Prior to Visit  Medication Sig Dispense Refill  . aspirin EC 81 MG tablet Take 1 tablet (81 mg total) by mouth daily. 30 tablet 0  . atorvastatin (LIPITOR) 40 MG tablet TAKE 1 TABLET BY MOUTH DAILY. 30 tablet 6  .  carvedilol (COREG) 3.125 MG tablet TAKE 1 TABLET BY MOUTH 2 TIMES DAILY WITH A MEAL 60 tablet PRN  . furosemide (LASIX) 20 MG tablet Take 1 tablet (20 mg total) by mouth daily. 90 tablet 0  . hydrALAZINE (APRESOLINE) 50 MG tablet AKE 1 AND 1/2 TABLETS BY MOUTH EVERY 8 HOURS. 135 tablet 6  . isosorbide mononitrate (IMDUR) 30 MG 24 hr tablet Take 1 tablet (30 mg total) by mouth daily. 90 tablet 3  . pantoprazole (PROTONIX) 40 MG tablet Take 1 tablet (40 mg total) by mouth daily. 30 tablet 10  . saccharomyces boulardii (FLORASTOR) 250 MG capsule Take 1 capsule (250 mg total) by mouth 2 (two) times daily. 60 capsule 0  . warfarin (COUMADIN) 2 MG tablet Take 1 tablet by mouth daily or as directed by coumadin clinic 30 tablet 6  . clindamycin (CLEOCIN) 300 MG capsule Take 2 capsules (600 mg total) by mouth as directed. Take one hour prior to dental work (Patient not taking: Reported on 03/27/2016) 2 capsule 0   No current facility-administered medications on file prior to visit.     Past Medical History:  Diagnosis Date  . Acute CHF (congestive heart failure) (HCC) 12/14/2014  . Hyperlipidemia   . Hypertension   . 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 S/P MVR (mitral valve replacement) 04/11/1998   #29 St. Jude bileaflet mechanical valve for bacterial endocarditis  . S/P redo mitral valve replacement with bioprosthetic valve  07/25/2014   27 mm [facility] Mitral bovine bioprosthetic tissue valve  . Stroke (HCC)   . 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 Valvular endocarditis 2000   Streptococcus    Past Surgical History:  Procedure Laterality Date  . ABDOMINAL HYSTERECTOMY    . CARDIAC CATHETERIZATION N/A 07/24/2014   Procedure: Right/Left Heart Cath and Coronary Angiography;  Surgeon: Iran Ouch, MD;  Location: [facility] INVASIVE CV LAB;  Service: Cardiovascular;   Laterality: N/A;  . CARDIAC CATHETERIZATION N/A 07/22/2014   Procedure: Fluoroscopy Guidance;  Surgeon: Lennette Bihari, MD;  Location: [facility] INVASIVE CV LAB;  Service: Cardiovascular;  Laterality: N/A;  . CARDIAC CATHETERIZATION N/A 12/16/2014   Procedure: Right Heart Cath;  Surgeon: Laurey Morale, MD;  Location: [facility] INVASIVE CV LAB;  Service: Cardiovascular;  Laterality: N/A;  . CARDIAC VALVE REPLACEMENT    . MITRAL VALVE REPLACEMENT  04/11/1998   St. Jude mechanical MVR (severe MR, episode of streptococcal bacterial endocarditis - Dr. Cornelius Moras  . MITRAL VALVE REPLACEMENT N/A 07/25/2014   Procedure: REDO MITRAL VALVE REPLACEMENT (MVR);  Surgeon: Purcell Nails, MD;  Location: [facility] OR;  Service: Open Heart Surgery;  Laterality: N/A;  . TOTAL ABDOMINAL HYSTERECTOMY W/ BILATERAL SALPINGOOPHORECTOMY  12/14/1998  . TRANSTHORACIC ECHOCARDIOGRAM  11/2009   EF=>55%; bi-leaflet St. Jude mech prosthesis; mild TR, RVSP elevated at 40-66mmHg, mod pulm HTN; trace AV regurg; mild pulm valve regurg  . TUBAL LIGATION      Family History  Problem Relation Age of Onset  . Lung cancer Mother   . Heart attack Mother   . Heart failure Maternal Grandmother   . Heart failure Maternal Grandfather   . Stroke Maternal Grandfather   . Stroke Father   . Colon cancer Father   . Diabetes Father   . Stroke Sister   . Lung disease Neg Hx     Social History   Social History  . Marital status: Divorced    Spouse name: N/A  . Number of children: 3  . Years of education: 14   Occupational History  .       Social History Main Topics  . Smoking status: Former Smoker    Packs/day: 0.10    Types: Cigarettes    Quit date: 01/17/1973  . Smokeless tobacco: Never Used  . Alcohol use No  . Drug use: No  . Sexual activity: Not Currently   Other Topics Concern  . None   Social History Narrative   [facility] (03/27/16):   Originally from [facility]. Lives with her daughter in a townhouse. Patient did get lost while driving  around this winter and is no longer allowed to drive. No pets currently. No bird or mold exposure.       Objective:   Physical Exam BP 110/80 (BP Location: Right Arm, Patient Position: Sitting, Cuff Size: Normal)   Pulse (!) 52   Ht 5\' 3"  (1.6 m)   Wt 131 lb 12.8 oz (59.8 kg)   LMP  (LMP Unknown)   SpO2 92%   BMI 23.35 kg/m  General:  Awake. Alert. No acute distress. Son accompanying patient today.  Integument:  Warm & dry. No rash on exposed skin. No bruising on exposed skin. Extremities:  No cyanosis or clubbing.  Lymphatics:  No appreciated cervical or supraclavicular lymphadenoapthy. HEENT:  Moist mucus membranes. No oral ulcers. No scleral injection or icterus. Moderate bilateral nasal turbinate swelling. Cardiovascular:  Regular rate and rhythm. No edema. 3/6 ejection murmur  noted.  Pulmonary:  Good aeration & clear to auscultation bilaterally. Symmetric chest wall expansion. No accessory muscle use on room air. Abdomen: Soft. Normal bowel sounds. Nondistended. Grossly nontender. Musculoskeletal:  Normal bulk and tone. Hand grip strength 5/5 bilaterally. No joint deformity or effusion appreciated. Neurological:  CN 2-12 grossly in tact. No meningismus. Moving all 4 extremities equally. Symmetric brachioradialis deep tendon reflexes. Psychiatric:  Mood and affect congruent. Speech normal rhythm, rate & tone. Not oriented to year, place, president, or building she is in. She is aware that it is/was Winter and also who her son is.  PFT 02/23/16: FVC 2.19 L (95%) FEV1 1.64 L (91%) FEV1/FVC 0.75 FEF 25-75 1.43 L (84%) negative bronchodilator response TLC 4.86 L (99%) RV 138% ERV 375% DLCO uncorrected 38%  WALK TEST 03/27/16:  Walked 3 laps (at a fast pace) / Baseline Sat 98% on Room Air / Nadir Sat 94% on Room Air   CARDIAC TTE (02/16/16):  LV normal in size with mild concentric hypertrophy. EF 60-65% with normal regional wall motion. Grade 2 diastolic dysfunction. LA mildly dilated &  RA normal in size. RV mildly dilated with preserved systolic function. Mild aortic regurgitation without stenosis. Aortic root normal in size. Bioprosthetic mitral valve with mild stenosis and no regurgitation noted. Moderate pulmonic regurgitation without stenosis. Mild tricuspid regurgitation. No pericardial effusion.  RIGHT HEART CATHETERIZATION (12/16/14): RA mean: 1 mmHg RV: 35/1 element of mercury Pulmonary artery: 34/12 mmHg Pulmonary artery mean: 21 mmHg Pulmonary capillary wedge pressure mean: 9 mmHg Pulmonary artery saturation: 61% Aortic saturation: 95% Cardiac output: 4.43 Cardiac index: 2.59 Pulmonary vascular resistance: 2.7 wood units   LEFT HEART CATHETERIZATION (07/24/14): Left ventricular end-diastolic pressure: 13 mmHg LV: 99/9 Left main: Angiographically normal Left anterior descending: Minimal luminal irregularities Left circumflex: Angiographically normal Right coronary artery: Dominant. Angiographically normal. Aortic valve: No stenosis Mitral valve: Mechanical valve prosthesis with severe mitral valve stenosis.   RIGHT HEART CATHETERIZATION (07/24/14): Right atrial mean pressure: 13 mmHg RV: 76/10 (see 13) Pulmonary: 75/34 (50) Pulmonary wedge pressure mean: 38 mmHg Cardiac output: 4.66 Cardiac index: 2.43     Assessment & Plan:  71 y.o. female with chronic amiodarone use. Reviewing patient's pulmonary function testing from February does show a severe reduction in her carbon monoxide diffusion capacity which could indicate amiodarone-induced lung toxicity. She has no other pulmonary complaints at this time. Reviewing her previous left and right heart catheterization results as well as her echocardiogram shows reasonable left ventricular and right ventricular function after replacing her mechanical mitral valve for a bioprosthetic mitral valve. The patient has no physical exam findings that would suggest congestive heart failure. I do feel it is reasonable to  perform chest x-ray imaging given the results as mentioned. However, the patient had no significant desaturation or evidence of hypoxia during a brief walk test today. I instructed the patient and her son to contact my office if they had any new breathing problems or questions before her next appointment.  1. 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 Checking chest x-ray PA/LAT today. May require CT imaging of the chest to confirm or exclude depending upon this result. Repeat spirometry with DLCO as well as 6 minute walk test on room air at next appointment. Patient to remain off amiodarone. 2. Short-term memory deficit: Likely due to prior cardiac arrest and anoxic brain injury. 3. Follow-up: Patient to return to clinic in 4 weeks or sooner if needed.  Donna Christen Jamison Neighbor, M.D. [facility] Pager:  [phone] After 3pm or if no response, call [phone] 5:12 PM 03/27/16

## 2016-03-27 NOTE — Addendum Note (Signed)
Addended by: Pamalee Leyden on: 03/27/2016 05:23 PM   Modules accepted: Orders

## 2016-03-27 NOTE — Patient Instructions (Signed)
   Please call or e-mail me if you have any new breathing problems or questions before your next appointment.  Make sure to stay off the Amiodarone that you were on previously because we're worried it may be doing damage to your lungs.  You may need a CT scan of your chest depending on the results of your chest X-ray today. We will call and let your daughter know the result.  We will do a breathing & walking test when we see you back.  TESTS ORDERED: 1. CXR PA/LAT Today 2. on room air at next appointment 3. Spirometry with DLCO at next appointment

## 2016-03-28 ENCOUNTER — Other Ambulatory Visit: Payer: Self-pay

## 2016-03-28 DIAGNOSIS — T462X5A Adverse effect of other antidysrhythmic drugs, initial encounter: Secondary | ICD-10-CM

## 2016-03-28 DIAGNOSIS — J984 Other disorders of lung: Secondary | ICD-10-CM

## 2016-03-28 NOTE — Progress Notes (Signed)
Called daughter and left voicemail for her to contact office to receive patient's results. Order for CT will be placed after speaking daughter to occur before next appointment. Will call back at later time.

## 2016-03-29 ENCOUNTER — Ambulatory Visit (INDEPENDENT_AMBULATORY_CARE_PROVIDER_SITE_OTHER): Payer: Medicare Other | Admitting: Pharmacist

## 2016-03-29 DIAGNOSIS — Z952 Presence of prosthetic heart valve: Secondary | ICD-10-CM | POA: Diagnosis not present

## 2016-03-29 DIAGNOSIS — Z953 Presence of xenogenic heart valve: Secondary | ICD-10-CM | POA: Diagnosis not present

## 2016-03-29 DIAGNOSIS — Z7901 Long term (current) use of anticoagulants: Secondary | ICD-10-CM | POA: Diagnosis not present

## 2016-03-29 LAB — POCT INR: INR: 2

## 2016-04-12 ENCOUNTER — Ambulatory Visit (INDEPENDENT_AMBULATORY_CARE_PROVIDER_SITE_OTHER): Payer: Medicare Other | Admitting: Pharmacist

## 2016-04-12 DIAGNOSIS — Z952 Presence of prosthetic heart valve: Secondary | ICD-10-CM | POA: Diagnosis not present

## 2016-04-12 DIAGNOSIS — Z953 Presence of xenogenic heart valve: Secondary | ICD-10-CM

## 2016-04-12 DIAGNOSIS — Z7901 Long term (current) use of anticoagulants: Secondary | ICD-10-CM

## 2016-04-12 LAB — POCT INR: INR: 2.6

## 2016-04-15 ENCOUNTER — Ambulatory Visit (INDEPENDENT_AMBULATORY_CARE_PROVIDER_SITE_OTHER)
Admission: RE | Admit: 2016-04-15 | Discharge: 2016-04-15 | Disposition: A | Discharge status: Home / Self Care | Payer: Medicare Other | Source: Ambulatory Visit | Attending: Pulmonary Disease | Admitting: Pulmonary Disease

## 2016-04-15 DIAGNOSIS — J984 Other disorders of lung: Secondary | ICD-10-CM | POA: Diagnosis not present

## 2016-04-15 DIAGNOSIS — J8410 Pulmonary fibrosis, unspecified: Secondary | ICD-10-CM | POA: Diagnosis not present

## 2016-04-15 DIAGNOSIS — T462X5A Adverse effect of other antidysrhythmic drugs, initial encounter: Secondary | ICD-10-CM

## 2016-04-19 ENCOUNTER — Institutional Professional Consult (permissible substitution): Payer: Medicare Other | Admitting: Pulmonary Disease

## 2016-04-22 ENCOUNTER — Ambulatory Visit (INDEPENDENT_AMBULATORY_CARE_PROVIDER_SITE_OTHER): Payer: Medicare Other | Admitting: Pulmonary Disease

## 2016-04-22 ENCOUNTER — Ambulatory Visit: Payer: Medicare Other | Admitting: Acute Care

## 2016-04-22 ENCOUNTER — Telehealth: Payer: Self-pay | Admitting: Pulmonary Disease

## 2016-04-22 DIAGNOSIS — T462X5A Adverse effect of other antidysrhythmic drugs, initial encounter: Principal | ICD-10-CM

## 2016-04-22 DIAGNOSIS — J984 Other disorders of lung: Secondary | ICD-10-CM

## 2016-04-22 LAB — PULMONARY FUNCTION TEST
DL/VA % PRED: 53 %
DL/VA: 2.55 ml/min/mmHg/L
DLCO cor % pred: 33 %
DLCO cor: 8.2 ml/min/mmHg
DLCO unc % pred: 35 %
DLCO unc: 8.73 ml/min/mmHg
FEF 25-75 Pre: 1.92 L/sec
FEF2575-%PRED-PRE: 110 %
FEV1-%Pred-Pre: 99 %
FEV1-PRE: 1.87 L
FEV1FVC-%Pred-Pre: 108 %
FEV6-%PRED-PRE: 95 %
FEV6-PRE: 2.21 L
FEV6FVC-%PRED-PRE: 104 %
FVC-%Pred-Pre: 91 %
FVC-PRE: 2.21 L
PRE FEV6/FVC RATIO: 100 %
Pre FEV1/FVC ratio: 84 %

## 2016-04-22 NOTE — Progress Notes (Signed)
PFT done today. 

## 2016-04-22 NOTE — Telephone Encounter (Signed)
LM x 1 for patient to return call Pt has appt with SG on 05/06/16 to review results.

## 2016-04-23 NOTE — Telephone Encounter (Signed)
JN  Pt's daughter was wanting the results of pt's 6 min walk and PFT before her follow up appointment, she stated she will keep the f/u but they just wanted the results. Informed her that you were unavailable and offered to have another provider can look at this but she declined at this time.

## 2016-04-23 NOTE — Telephone Encounter (Signed)
[email or web address] x1 

## 2016-04-23 NOTE — Telephone Encounter (Signed)
Patient daughter [name] called and is wanting to discuss results prior to appt - she will still have mother keep appt but since her mother had a stroke she is wanting to get the results sooner. She can be reached on her cell [phone] but she is at work today and may not be able to answer so she can also be reached at work on [phone] -pr

## 2016-04-23 NOTE — Telephone Encounter (Signed)
Pt daughter returning call.Stanley A Dalton ° °

## 2016-04-24 ENCOUNTER — Ambulatory Visit: Payer: Medicare Other | Admitting: Neurology

## 2016-04-25 NOTE — Progress Notes (Signed)
Message left for pt's son and daughter to contact office regarding pts medical results.

## 2016-04-25 NOTE — Telephone Encounter (Signed)
JN please advise on PFT and 8mw results.  Thanks.

## 2016-04-26 ENCOUNTER — Ambulatory Visit (INDEPENDENT_AMBULATORY_CARE_PROVIDER_SITE_OTHER): Payer: Medicare Other | Admitting: Pharmacist Clinician (PhC)/ Clinical Pharmacy Specialist

## 2016-04-26 DIAGNOSIS — Z953 Presence of xenogenic heart valve: Secondary | ICD-10-CM

## 2016-04-26 DIAGNOSIS — Z952 Presence of prosthetic heart valve: Secondary | ICD-10-CM | POA: Diagnosis not present

## 2016-04-26 DIAGNOSIS — Z7901 Long term (current) use of anticoagulants: Secondary | ICD-10-CM | POA: Diagnosis not present

## 2016-04-26 LAB — POCT INR: INR: 1.2

## 2016-04-29 NOTE — Progress Notes (Signed)
 04/22/16:  Walked 429 meters / Baseline Sat 97% on RA / Nadir Sat 96% on RA

## 2016-05-01 NOTE — Telephone Encounter (Signed)
Please let the patient know that her walk test did not show any significant desaturation. Her pulmonary function testing does show a reduced carbon monoxide diffusion capacity which can be reviewed at her follow-up appointment. Thank you.

## 2016-05-01 NOTE — Telephone Encounter (Signed)
LMTCB

## 2016-05-02 NOTE — Telephone Encounter (Signed)
Pt daughter has received results, no need to call her again, please close out msg.Caren Griffins

## 2016-05-06 ENCOUNTER — Telehealth: Payer: Self-pay | Admitting: Internal Medicine

## 2016-05-06 ENCOUNTER — Encounter: Payer: Self-pay | Admitting: Acute Care

## 2016-05-06 ENCOUNTER — Ambulatory Visit (INDEPENDENT_AMBULATORY_CARE_PROVIDER_SITE_OTHER): Payer: Medicare Other | Admitting: Acute Care

## 2016-05-06 VITALS — BP 118/60 | HR 63 | Ht 64.0 in | Wt 128.0 lb

## 2016-05-06 DIAGNOSIS — J8410 Pulmonary fibrosis, unspecified: Secondary | ICD-10-CM

## 2016-05-06 DIAGNOSIS — T462X5A Adverse effect of other antidysrhythmic drugs, initial encounter: Secondary | ICD-10-CM

## 2016-05-06 DIAGNOSIS — J984 Other disorders of lung: Secondary | ICD-10-CM | POA: Diagnosis not present

## 2016-05-06 DIAGNOSIS — R0989 Other specified symptoms and signs involving the circulatory and respiratory systems: Secondary | ICD-10-CM | POA: Insufficient documentation

## 2016-05-06 MED ORDER — HYDRALAZINE HCL 50 MG PO TABS: 75.0000 mg | ORAL_TABLET | Freq: Three times a day (TID) | ORAL | 6 refills | Status: DC

## 2016-05-06 NOTE — Progress Notes (Signed)
History of Present Illness [name] is a 71 y.o. 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 Denies any chronic coughing or wheezing. She may have had a URI around the time of her previous physician visit that prompted her PFT per her son. Denies any chest pain, tightness, or pressure. The patient admits she doesn't exercise or walk on a regular basis. She reports she is able to vacuum her 2 bedroom townhouse, which is all on one floor, at one time without taking a break. No abdominal pain, nausea, or emesis. No fever, chills, or sweats. No headaches, syncope or near syncope. She has had memory difficulties since her previous cardiac arrest. No focal weakness, numbness, or tingling. Patient was previously on Amiodarone but this was discontinued on 3/2 which is confirmed with a phone note conversation after her abnormal pulmonary function tests.   05/06/2016 Follow up OV: Patient presents for follow-up after 6 minute walk and repeat Pulmonary Function Tests. Patient's 6 minute walk did not show any significant desaturations. She walked 429 m with a baseline saturation of 97% on room air. Nadir sat was 96% on room air. Repeat pulmonary function tests consistently show significant decreased diffusion capacity with minimal airway obstruction. Screening hemoglobin was 15.7, so anemia not suspected etiology. She continues to deny any shortness of breath. She has no complaints regarding her breathing. She denies fever, chest pain, orthopnea, or hemoptysis.   Test Results:  PFT: 04/22/2016 FVC: 2.21, (91%), FEV1: 1.87 (99%), FF ratio: 84 (108%), FEF 25-75 1.92 (110%), bronchodilator response was not tested, TLC was not documented, DLCO uncorrected 35%.  HRCT 04/15/2016 IMPRESSION: 1. Basilar pulmonary fibrosis may be post infectious in etiology. Amiodarone toxicity is not excluded. 2. Amiodarone  liver. 3.  Aortic atherosclerosis (ICD10-170.0). 4. Bilateral thyroid nodules. Consider further evaluation with thyroid ultrasound. If patient is clinically hyperthyroid, consider nuclear medicine thyroid uptake and scan. 5. Left adrenal adenoma.  CBC Latest Ref Rng & Units 02/23/2015 02/21/2015 02/21/2015  WBC 4.0 - 10.5 K/uL 2.9(L) 3.8(L) 3.3(A)  Hemoglobin 12.0 - 15.0 g/dL 11.5(L) 13.3 12.5  Hematocrit 36.0 - 46.0 % 34.9(L) 41.0 37.9  Platelets 150 - 400 K/uL 149(L) 217 -    BMP Latest Ref Rng & Units 03/28/2015 03/14/2015 02/24/2015  Glucose 70 - 99 mg/dL 94 537(S) 93  BUN 6 - 23 mg/dL 13 14 82(L)  Creatinine 0.40 - 1.20 mg/dL 0.78(M) 7.54(G) 9.20(F)  Sodium 135 - 145 mEq/L 144 144 142  Potassium 3.5 - 5.1 mEq/L 3.3(L) 3.6 3.7  Chloride 96 - 112 mEq/L 106 111 105  CO2 19 - 32 mEq/L 33(H) 23 29  Calcium 8.4 - 10.5 mg/dL 00.7 12.1 9.9    BNP    Component Value Date/Time   BNP 227.0 (H) 01/18/2015 1019    ProBNP    Component Value Date/Time   PROBNP 988.0 (H) 01/16/2007 0355    PFT    Component Value Date/Time   FEV1PRE 1.87 04/22/2016 0939   FEV1POST 1.75 02/23/2016 0848   FVCPRE 2.21 04/22/2016 0939   FVCPOST 2.28 02/23/2016 0848   TLC 4.86 02/23/2016 0848   DLCOUNC 8.73 04/22/2016 0939   PREFEV1FVCRT 84 04/22/2016 0939   PSTFEV1FVCRT 77 02/23/2016 0848    Ct Chest High Resolution  Result Date: 04/15/2016 CLINICAL DATA:  Amiodarone pulmonary toxicity, evaluate for lung scarring. EXAM: CT CHEST WITHOUT CONTRAST TECHNIQUE: Multidetector CT imaging of the chest  was performed following the standard protocol without intravenous contrast. High resolution imaging of the lungs, as well as inspiratory and expiratory imaging, was performed. COMPARISON:  Chest radiograph 01/13/2007. FINDINGS: Cardiovascular: Atherosclerotic calcification of the arterial vasculature. Heart is enlarged. No pericardial effusion. There is scattered pericardial calcification. Mediastinum/Nodes:  Low-attenuation thyroid lesions measure up to approximately 1.7 cm on the left. Mediastinal lymph nodes measure up to 1.2 cm in the low right paratracheal station. Hilar regions are difficult to evaluate without IV contrast. No axillary adenopathy. There may be slight thickening of the distal esophageal wall which can be seen with gastroesophageal reflux. Distal periesophageal lymph node measures 8 mm. Lungs/Pleura: Basilar predominant traction bronchiectasis/ bronchiolectasis, ground-glass, volume loss and mild architectural distortion. Subpleural reticulation is not a predominating feature. Additional areas of scattered nodularity or scarring in the lungs bilaterally. No pleural fluid. Airway is unremarkable. No air trapping. Upper Abdomen: Visualized portion of the liver is increased in attenuation diffusely. Visualized portions of the liver, gallbladder and right adrenal gland are otherwise unremarkable. 1.7 cm fluid density nodule is seen in the left adrenal gland. Visualized portions of the kidneys, spleen, pancreas, stomach and bowel are grossly unremarkable. Musculoskeletal: No worrisome lytic or sclerotic lesions. Degenerative changes are seen in the spine. IMPRESSION: 1. Basilar pulmonary fibrosis may be post infectious in etiology. Amiodarone toxicity is not excluded. 2. Amiodarone liver. 3.  Aortic atherosclerosis (ICD10-170.0). 4. Bilateral thyroid nodules. Consider further evaluation with thyroid ultrasound. If patient is clinically hyperthyroid, consider nuclear medicine thyroid uptake and scan. 5. Left adrenal adenoma. Electronically Signed   By: Leanna Battles M.D.   On: 04/15/2016 10:34     Past medical hx Past Medical History:  Diagnosis Date  . Acute CHF (congestive heart failure) (HCC) 12/14/2014  . Hyperlipidemia   . Hypertension   . 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  S/P MVR (mitral valve replacement) 04/11/1998   #29 [employer] bileaflet mechanical valve for bacterial endocarditis  . S/P redo mitral valve replacement with bioprosthetic valve 07/25/2014   27 mm [facility] Mitral bovine bioprosthetic tissue valve  . Stroke (HCC)   . 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 Valvular endocarditis 2000   Streptococcus     Social History  Substance Use Topics  . Smoking status: Former Smoker    Packs/day: 0.10    Types: Cigarettes    Quit date: 01/17/1973  . Smokeless tobacco: Never Used  . Alcohol use No    Tobacco Cessation: Counseling given: Not Answered   Past surgical hx, Family hx, Social hx all reviewed.  Current Outpatient Prescriptions on File Prior to Visit  Medication Sig  . aspirin EC 81 MG tablet Take 1 tablet (81 mg total) by mouth daily.  Marland Kitchen atorvastatin (LIPITOR) 40 MG tablet TAKE 1 TABLET BY MOUTH DAILY.  . carvedilol (COREG) 3.125 MG tablet TAKE 1 TABLET BY MOUTH 2 TIMES DAILY WITH A MEAL  . clindamycin (CLEOCIN) 300 MG capsule Take 2 capsules (600 mg total) by mouth as directed. Take one hour prior to dental work  . furosemide (LASIX) 20 MG tablet Take 1 tablet (20 mg total) by mouth daily.  . isosorbide mononitrate (IMDUR) 30 MG 24 hr tablet Take 1 tablet (30 mg total) by mouth daily.  . pantoprazole (PROTONIX) 40 MG tablet Take 1 tablet (40 mg total) by mouth daily.  Marland Kitchen saccharomyces boulardii (FLORASTOR) 250 MG capsule  Take 1 capsule (250 mg total) by mouth 2 (two) times daily.  Marland Kitchen warfarin (COUMADIN) 2 MG tablet Take 1 tablet by mouth daily or as directed by coumadin clinic   No current facility-administered medications on file prior to visit.      Allergies  Allergen Reactions  . Augmentin [Amoxicillin-Pot Clavulanate] Other (See Comments)    Unknown reaction Has patient had a PCN reaction causing immediate rash, facial/tongue/throat swelling, SOB or lightheadedness with hypotension Has patient had a PCN  reaction causing severe rash involving mucus membranes or skin necrosis Has patient had a PCN reaction that required hospitalization  Has patient had a PCN reaction occurring within the last 10 years] If all of the above answers are "NO", then may proceed with Cephalosporin use.     Review Of Systems:  Constitutional:   No  weight loss, night sweats,  Fevers, chills, fatigue, or  lassitude.  HEENT:   No headaches,  Difficulty swallowing,  Tooth/dental problems, or  Sore throat,                No sneezing, itching, ear ache, nasal congestion, post nasal drip,   CV:  No chest pain,  Orthopnea, PND, swelling in lower extremities, anasarca, dizziness, palpitations, syncope.   GI  No heartburn, indigestion, abdominal pain, nausea, vomiting, diarrhea, change in bowel habits, loss of appetite, bloody stools.   Resp: No shortness of breath with exertion or at rest.  No excess mucus, no productive cough,  No non-productive cough,  No coughing up of blood.  No change in color of mucus.  No wheezing.  No chest wall deformity  Skin: no rash or lesions.  GU: no dysuria, change in color of urine, no urgency or frequency.  No flank pain, no hematuria   MS:  No joint pain or swelling.  No decreased range of motion.  No back pain.  Psych:  No change in mood or affect. No depression or anxiety.  No memory loss.   Vital Signs BP 118/60 (BP Location: Right Arm, Cuff Size: Normal)   Pulse 63   Ht 5\' 4"  (1.626 m)   Wt 128 lb (58.1 kg)   LMP  (LMP Unknown)   SpO2 97%   BMI 21.97 kg/m    Physical Exam:  General- No distress,  A&Ox3, very pleasant ENT: No sinus tenderness, TM clear, pale nasal mucosa, no oral exudate,no post nasal drip, no LAN Cardiac: S1, S2, regular rate and rhythm, no murmur Chest: No wheeze/ positive crackles lower bases/ dullness; no accessory muscle use, no nasal flaring, no sternal retractions Abd.: Soft Non-tender, nondistended, flat, bowel sounds positive Ext: No  clubbing cyanosis, edema Neuro:  normal strength Skin: No rashes, warm and dry Psych: normal mood and behavior, memory issues   Assessment/Plan  Amiodarone pulmonary toxicity No desaturations per 6 minute walk DLCO uncorrected at 35% per PFTs for 2018 Previous amiodarone use HRCT for 2018 with basilar pulmonary fibrosis 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 You have stopped taking the Amiodarone. Your 6 minute walk did not show any drops in your oxygen which is good. We will have you follow up with Dr. Jamison Neighbor in 3 months with a 6 minute walk prior and Spirometry with DLCO.Marland Kitchen Please contact office for sooner follow up if symptoms do not improve or worsen or seek emergency care   No autoimmune workup at present  time We'll continue to follow for any clinical decline  Will need follow up of bilateral thyroid nodules by PCP  Message sent to PCP, Dr. Hillard Danker,  requesting follow-up of bilateral thyroid nodules.  Bevelyn Ngo, NP 05/06/2016  5:37 PM

## 2016-05-06 NOTE — Assessment & Plan Note (Addendum)
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 You have stopped taking the Amiodarone. Your 6 minute walk did not show any drops in your oxygen which is good. We will have you follow up with Dr. Jamison Neighbor in 3 months with a 6 minute walk prior and Spirometry with DLCO.Marland Kitchen Please contact office for sooner follow up if symptoms do not improve or worsen or seek emergency care   No autoimmune workup at present time We'll continue to follow for any clinical decline  Will need follow up of bilateral thyroid nodules by PCP

## 2016-05-06 NOTE — Progress Notes (Signed)
Note reviewed.  Deneane Stifter E. Lilo Wallington, M.D. [facility] Pager:  [phone] After 3pm or if no response, call [phone] 6:22 PM 05/06/16    

## 2016-05-06 NOTE — Telephone Encounter (Signed)
Left msg to advise refill was sent and to call back if further needs.

## 2016-05-06 NOTE — Telephone Encounter (Signed)
Pt says she need pt's Hydralazine called in today by 12:30 please.

## 2016-05-06 NOTE — Telephone Encounter (Signed)
New Message   Pt daughter has some questions regarding medication, and wants call back.   *STAT* If patient is at the pharmacy, call can be transferred to refill team.   1. Which medications need to be refilled? (please list name of each medication and dose if known) hydrALAZINE (APRESOLINE) 50 MG tablet  2. Which pharmacy/location (including street and city if local pharmacy) is medication to be sent to? [facility] - [place], [place] - [address]  3. Do they need a 30 day or 90 day supply? 30 day supply

## 2016-05-06 NOTE — Telephone Encounter (Signed)
Follow up     Pt states that she is out of this medication and needs it.  *STAT* If patient is at the pharmacy, call can be transferred to refill team.   1. Which medications need to be refilled? (please list name of each medication and dose if known) Hydralazine 50 mg  2. Which pharmacy/location (including street and city if local pharmacy) is medication to be sent to?[place] [facility]   3. Do they need a 30 day or 90 day supply? 30 day

## 2016-05-06 NOTE — Patient Instructions (Addendum)
It is nice to see you today Your CT scan does show some changes that we feel are due to the use of Amiodarone. You have stopped taking the Amiodarone. Your 6 minute walk did not show any drops in your oxygen which is good. We will have you follow up with Dr. Jamison Neighbor in 3 months with a 6 minute walk prior and Spirometry with DLCO.Marland Kitchen Please contact office for sooner follow up if symptoms do not improve or worsen or seek emergency care

## 2016-05-07 ENCOUNTER — Telehealth: Payer: Self-pay | Admitting: Acute Care

## 2016-05-07 ENCOUNTER — Telehealth: Payer: Self-pay | Admitting: Internal Medicine

## 2016-05-07 NOTE — Telephone Encounter (Signed)
Daughter called and is concerned about her mothers memory loss, she had a stroke in 2016 and would like to know what they can do to prolong her memory or help her memory.     [name] Work number [phone] call first

## 2016-05-07 NOTE — Telephone Encounter (Signed)
Daughter aware.

## 2016-05-07 NOTE — Telephone Encounter (Signed)
-----   Message from Myrlene Broker, MD sent at 05/07/2016  7:59 AM EDT ----- Thanks, as I have not seen her in over a year would recommend follow up in our office to discuss need for further follow up/labs.  Dr. Okey Dupre ----- Message ----- From: Bevelyn Ngo, NP Sent: 05/06/2016   5:41 PM To: Myrlene Broker, MD  Dr. Okey Dupre Please note results of HRCT done to evaluate for pulmonary fibrosis. Please note incidental finding of bilateral thyroid nodules with suggested imaging follow-up per radiology. Please follow-up as you feel is clinically indicated. Please note, we suspect amiodarone may be cause of pulmonary fibrosis on CT. She is no longer taking this medication. We will continue to monitor for any clinical decline. Patient currently is without complaints of shortness of breath or dyspnea. Please don't hesitate to contact us if you have any questions or concerns regarding this finding. Thank you

## 2016-05-07 NOTE — Telephone Encounter (Signed)
Please schedule patient for follow up appointment with her PCP to address incidental findings of thyroid nodules noted on CT chest recently done. I have let Dr. Okey Dupre know, but she has requested the patient be scheduled for an appointment with her as the patient  has not been seen in over a year.   I have tried calling the patient, her son and daughter with no answer to explain why she needs the follow up.   Thanks so much.

## 2016-05-07 NOTE — Telephone Encounter (Signed)
Pt is already scheduled to see Dr Okey Dupre on 05/17/16, and Dr. Okey Dupre is aware of this incidental finding.  Will close encounter.

## 2016-05-07 NOTE — Telephone Encounter (Signed)
She has not been seen in more than 1 year and would need visit.

## 2016-05-10 ENCOUNTER — Ambulatory Visit (INDEPENDENT_AMBULATORY_CARE_PROVIDER_SITE_OTHER): Payer: Medicare Other | Admitting: Pharmacist Clinician (PhC)/ Clinical Pharmacy Specialist

## 2016-05-10 DIAGNOSIS — Z953 Presence of xenogenic heart valve: Secondary | ICD-10-CM

## 2016-05-10 DIAGNOSIS — Z952 Presence of prosthetic heart valve: Secondary | ICD-10-CM

## 2016-05-10 DIAGNOSIS — Z7901 Long term (current) use of anticoagulants: Secondary | ICD-10-CM | POA: Diagnosis not present

## 2016-05-10 LAB — POCT INR: INR: 2.4

## 2016-05-14 ENCOUNTER — Other Ambulatory Visit: Payer: Self-pay | Admitting: Internal Medicine

## 2016-05-14 MED ORDER — ATORVASTATIN CALCIUM 40 MG PO TABS: 40.0000 mg | ORAL_TABLET | Freq: Every day | ORAL | 1 refills | Status: DC

## 2016-05-14 NOTE — Telephone Encounter (Signed)
Rx(s) sent to pharmacy electronically.  

## 2016-05-14 NOTE — Telephone Encounter (Signed)
°*  STAT* If patient is at the pharmacy, call can be transferred to refill team.   1. Which medications need to be refilled? (please list name of each medication and dose if known) Atorvastatin 40 mg   2. Which pharmacy/location (including street and city if local pharmacy) is medication to be sent to?[facility] - [place], [place] - [address].  3. Do they need a 30 day or 90 day supply? 90

## 2016-05-17 ENCOUNTER — Ambulatory Visit: Payer: Medicare Other | Admitting: Internal Medicine

## 2016-05-17 ENCOUNTER — Encounter: Payer: Self-pay | Admitting: Internal Medicine

## 2016-05-17 ENCOUNTER — Ambulatory Visit (INDEPENDENT_AMBULATORY_CARE_PROVIDER_SITE_OTHER): Payer: Medicare Other | Admitting: Internal Medicine

## 2016-05-17 ENCOUNTER — Other Ambulatory Visit (INDEPENDENT_AMBULATORY_CARE_PROVIDER_SITE_OTHER): Payer: Medicare Other

## 2016-05-17 VITALS — BP 114/62 | HR 60 | Temp 98.2°F | Resp 12 | Ht 64.0 in | Wt 124.0 lb

## 2016-05-17 DIAGNOSIS — N183 Chronic kidney disease, stage 3 unspecified: Secondary | ICD-10-CM

## 2016-05-17 DIAGNOSIS — Z8673 Personal history of transient ischemic attack (TIA), and cerebral infarction without residual deficits: Secondary | ICD-10-CM | POA: Diagnosis not present

## 2016-05-17 DIAGNOSIS — R7301 Impaired fasting glucose: Secondary | ICD-10-CM

## 2016-05-17 DIAGNOSIS — F0150 Vascular dementia without behavioral disturbance: Secondary | ICD-10-CM | POA: Diagnosis not present

## 2016-05-17 DIAGNOSIS — E785 Hyperlipidemia, unspecified: Secondary | ICD-10-CM | POA: Diagnosis not present

## 2016-05-17 DIAGNOSIS — I10 Essential (primary) hypertension: Secondary | ICD-10-CM | POA: Diagnosis not present

## 2016-05-17 LAB — CBC
HEMATOCRIT: 38 % (ref 36.0–46.0)
Hemoglobin: 12.6 g/dL (ref 12.0–15.0)
MCHC: 33.3 g/dL (ref 30.0–36.0)
MCV: 89.9 fl (ref 78.0–100.0)
Platelets: 200 10*3/uL (ref 150.0–400.0)
RBC: 4.23 Mil/uL (ref 3.87–5.11)
RDW: 13.5 % (ref 11.5–15.5)
WBC: 2.9 10*3/uL — ABNORMAL LOW (ref 4.0–10.5)

## 2016-05-17 LAB — LIPID PANEL
CHOL/HDL RATIO: 2
CHOLESTEROL: 207 mg/dL — AB (ref 0–200)
HDL: 88.8 mg/dL (ref >39.00)
LDL Cholesterol: 106 mg/dL — ABNORMAL HIGH (ref 0–99)
NonHDL: 117.96
TRIGLYCERIDES: 58 mg/dL (ref 0.0–149.0)
VLDL: 11.6 mg/dL (ref 0.0–40.0)

## 2016-05-17 LAB — COMPREHENSIVE METABOLIC PANEL
ALBUMIN: 3.9 g/dL (ref 3.5–5.2)
ALT: 21 U/L (ref 0–35)
AST: 26 U/L (ref 0–37)
Alkaline Phosphatase: 65 U/L (ref 39–117)
BILIRUBIN TOTAL: 1.7 mg/dL — AB (ref 0.2–1.2)
BUN: 18 mg/dL (ref 6–23)
CALCIUM: 10.3 mg/dL (ref 8.4–10.5)
CO2: 33 mEq/L — ABNORMAL HIGH (ref 19–32)
CREATININE: 1.9 mg/dL — AB (ref 0.40–1.20)
Chloride: 103 mEq/L (ref 96–112)
GFR: 33.74 mL/min — AB (ref >60.00)
Glucose, Bld: 139 mg/dL — ABNORMAL HIGH (ref 70–99)
Potassium: 3 mEq/L — ABNORMAL LOW (ref 3.5–5.1)
Sodium: 143 mEq/L (ref 135–145)
TOTAL PROTEIN: 7.3 g/dL (ref 6.0–8.3)

## 2016-05-17 LAB — HEMOGLOBIN A1C: HEMOGLOBIN A1C: 5.5 % (ref 4.6–6.5)

## 2016-05-17 NOTE — Progress Notes (Signed)
   Subjective:    Patient ID: [name], female    DOB: 1945/08/29, 71 y.o.   MRN: [id number]  HPI The patient is a 71 YO female coming in for memory changes since stroke in 2016. She is with her daughter today and no new concerns. Her daughter is concerned about her memory. Noticed immediate changes after the stroke, stable since that time. Her daughter handles her finances. She does not drive anymore due to her preference. She did not have any accidents or get lost prior to stopping driving. Some problems with short term memory. She is able to do all ADLs and cooking for herself. Needs reminders of visits and her daughter helps monitor her medications. She denies confusion or behavior changes. No delusions or hallucinations. She denies any depression or anxiety. She is sleeping well, no guilt.   Review of Systems  Constitutional: Negative.   HENT: Negative.   Eyes: Negative.   Respiratory: Negative for cough, chest tightness and shortness of breath.   Cardiovascular: Negative for chest pain, palpitations and leg swelling.  Gastrointestinal: Negative for abdominal distention, abdominal pain, constipation, diarrhea, nausea and vomiting.  Musculoskeletal: Negative.   Skin: Negative.   Neurological: Negative for dizziness, tremors, seizures, facial asymmetry, speech difficulty, weakness and numbness.       Memory changes  Psychiatric/Behavioral: Negative.       Objective:   Physical Exam  Constitutional: She is oriented to person, place, and time. She appears well-developed and well-nourished.  HENT:  Head: Normocephalic and atraumatic.  Eyes: EOM are normal.  Neck: Normal range of motion.  Cardiovascular: Normal rate and regular rhythm.   Pulmonary/Chest: Effort normal and breath sounds normal. No respiratory distress. She has no wheezes. She has no rales.  Abdominal: Soft. Bowel sounds are normal. She exhibits no distension. There is no tenderness. There is no rebound.    Musculoskeletal: She exhibits no edema.  Neurological: She is alert and oriented to person, place, and time. Coordination normal.  MMSE 23 on exam  Skin: Skin is warm and dry.  Psychiatric: She has a normal mood and affect.   Vitals:   05/17/16 0853  BP: 114/62  Pulse: 60  Resp: 12  Temp: 98.2 F (36.8 C)  TempSrc: Oral  SpO2: 99%  Weight: 124 lb (56.2 kg)  Height: 5\' 4"  (1.626 m)      Assessment & Plan:

## 2016-05-17 NOTE — Assessment & Plan Note (Signed)
Checking labs today and adjust as needed. There was a one time decrease in memory after stroke in 2016 with stability since that time. She does not drive or handle her own finances at this time. She is able to do all ADLs and cooking without assistance. BP at goal and checking lipid panel and HgA1c today. On coumadin for past heart valve for stroke prevention.

## 2016-05-17 NOTE — Assessment & Plan Note (Signed)
Taking lipitor 40 mg daily, goal LDL <70, checking lipid panel and adjust as needed.

## 2016-05-17 NOTE — Assessment & Plan Note (Signed)
BP at goal on her hydralazine, imdur, lasix, coreg. Complicated by CKD stage 3 and checking CMP and adjust as needed.

## 2016-05-17 NOTE — Assessment & Plan Note (Signed)
Due to BP and checking CMP today. BP at goal currently, adjust as needed.

## 2016-05-17 NOTE — Patient Instructions (Signed)
We are checking the labs today and will send the results on mychart.  Memory Compensation Strategies  1. Use "WARM" strategy.  W= write it down  A= associate it  R= repeat it  M= make a mental note  2.   You can keep a Glass blower/designer.  Use a 3-ring notebook with sections for the following: calendar, important names and phone numbers,  medications, doctors' names/phone numbers, lists/reminders, and a section to journal what you did  each day.   3.    Use a calendar to write appointments down.  4.    Write yourself a schedule for the day.  This can be placed on the calendar or in a separate section of the Memory Notebook.  Keeping a  regular schedule can help memory.  5.    Use medication organizer with sections for each day or morning/evening pills.  You may need help loading it  6.    Keep a basket, or pegboard by the door.  Place items that you need to take out with you in the basket or on the pegboard.  You may also want to  include a message board for reminders.  7.    Use sticky notes.  Place sticky notes with reminders in a place where the task is performed.  For example: " turn off the  stove" placed by the stove, "lock the door" placed on the door at eye level, " take your medications" on  the bathroom mirror or by the place where you normally take your medications.  8.    Use alarms/timers.  Use while cooking to remind yourself to check on food or as a reminder to take your medicine, or as a  reminder to make a call, or as a reminder to perform another task, etc.

## 2016-05-24 ENCOUNTER — Ambulatory Visit (INDEPENDENT_AMBULATORY_CARE_PROVIDER_SITE_OTHER): Payer: Medicare Other | Admitting: Pharmacist

## 2016-05-24 DIAGNOSIS — Z7901 Long term (current) use of anticoagulants: Secondary | ICD-10-CM

## 2016-05-24 DIAGNOSIS — Z952 Presence of prosthetic heart valve: Secondary | ICD-10-CM

## 2016-05-24 DIAGNOSIS — Z953 Presence of xenogenic heart valve: Secondary | ICD-10-CM

## 2016-05-24 LAB — POCT INR: INR: 2.1

## 2016-06-07 ENCOUNTER — Ambulatory Visit (INDEPENDENT_AMBULATORY_CARE_PROVIDER_SITE_OTHER): Payer: Medicare Other | Admitting: Pharmacist

## 2016-06-07 DIAGNOSIS — Z952 Presence of prosthetic heart valve: Secondary | ICD-10-CM | POA: Diagnosis not present

## 2016-06-07 DIAGNOSIS — Z953 Presence of xenogenic heart valve: Secondary | ICD-10-CM | POA: Diagnosis not present

## 2016-06-07 DIAGNOSIS — Z7901 Long term (current) use of anticoagulants: Secondary | ICD-10-CM

## 2016-06-07 LAB — POCT INR: INR: 1.7

## 2016-06-20 ENCOUNTER — Other Ambulatory Visit: Payer: Self-pay | Admitting: Internal Medicine

## 2016-06-20 ENCOUNTER — Other Ambulatory Visit (HOSPITAL_COMMUNITY): Payer: Self-pay | Admitting: Cardiology

## 2016-06-21 ENCOUNTER — Other Ambulatory Visit: Payer: Self-pay | Admitting: Pharmacist

## 2016-06-21 ENCOUNTER — Ambulatory Visit (INDEPENDENT_AMBULATORY_CARE_PROVIDER_SITE_OTHER): Payer: Medicare Other | Admitting: Pharmacist Clinician (PhC)/ Clinical Pharmacy Specialist

## 2016-06-21 DIAGNOSIS — Z7901 Long term (current) use of anticoagulants: Secondary | ICD-10-CM

## 2016-06-21 DIAGNOSIS — Z953 Presence of xenogenic heart valve: Secondary | ICD-10-CM | POA: Diagnosis not present

## 2016-06-21 DIAGNOSIS — Z952 Presence of prosthetic heart valve: Secondary | ICD-10-CM | POA: Diagnosis not present

## 2016-06-21 LAB — POCT INR: INR: 2.1

## 2016-06-21 MED ORDER — CARVEDILOL 3.125 MG PO TABS: 3.1250 mg | ORAL_TABLET | Freq: Two times a day (BID) | ORAL | 3 refills | Status: DC

## 2016-06-21 NOTE — Telephone Encounter (Signed)
Followed by [facility]

## 2016-07-05 ENCOUNTER — Ambulatory Visit (INDEPENDENT_AMBULATORY_CARE_PROVIDER_SITE_OTHER): Payer: Medicare Other | Admitting: Pharmacist

## 2016-07-05 DIAGNOSIS — Z952 Presence of prosthetic heart valve: Secondary | ICD-10-CM

## 2016-07-05 DIAGNOSIS — Z953 Presence of xenogenic heart valve: Secondary | ICD-10-CM

## 2016-07-05 DIAGNOSIS — Z7901 Long term (current) use of anticoagulants: Secondary | ICD-10-CM

## 2016-07-05 LAB — POCT INR: INR: 1.8

## 2016-07-16 ENCOUNTER — Ambulatory Visit: Payer: Medicare Other | Admitting: Neurology

## 2016-07-19 ENCOUNTER — Ambulatory Visit (INDEPENDENT_AMBULATORY_CARE_PROVIDER_SITE_OTHER): Payer: Medicare Other | Admitting: Pharmacist

## 2016-07-19 DIAGNOSIS — Z952 Presence of prosthetic heart valve: Secondary | ICD-10-CM | POA: Diagnosis not present

## 2016-07-19 DIAGNOSIS — Z953 Presence of xenogenic heart valve: Secondary | ICD-10-CM

## 2016-07-19 DIAGNOSIS — Z7901 Long term (current) use of anticoagulants: Secondary | ICD-10-CM

## 2016-07-19 LAB — POCT INR: INR: 2

## 2016-07-22 ENCOUNTER — Other Ambulatory Visit: Payer: Self-pay | Admitting: Internal Medicine

## 2016-07-23 ENCOUNTER — Other Ambulatory Visit: Payer: Self-pay | Admitting: Internal Medicine

## 2016-08-02 ENCOUNTER — Ambulatory Visit (INDEPENDENT_AMBULATORY_CARE_PROVIDER_SITE_OTHER): Payer: Medicare Other | Admitting: Pharmacist

## 2016-08-02 ENCOUNTER — Telehealth: Payer: Self-pay | Admitting: Internal Medicine

## 2016-08-02 DIAGNOSIS — Z953 Presence of xenogenic heart valve: Secondary | ICD-10-CM | POA: Diagnosis not present

## 2016-08-02 DIAGNOSIS — Z7901 Long term (current) use of anticoagulants: Secondary | ICD-10-CM | POA: Diagnosis not present

## 2016-08-02 DIAGNOSIS — Z952 Presence of prosthetic heart valve: Secondary | ICD-10-CM | POA: Diagnosis not present

## 2016-08-02 LAB — POCT INR: INR: 2.2

## 2016-08-02 NOTE — Telephone Encounter (Signed)
Lm2cb 

## 2016-08-02 NOTE — Telephone Encounter (Signed)
Daughter notified of pharmacist directions-she states that she was not informed of this verbalized understanding of directions

## 2016-08-02 NOTE — Telephone Encounter (Signed)
S/w daughter TiffanyJohnson, she states that she spoke with pharmacist and nothing else is needed, she will call PCP next week  S/w pharmacist she states that pt was directed to go [facility] for evaluation today  Lm2cb->>[facility]

## 2016-08-02 NOTE — Telephone Encounter (Signed)
°  New Prob  Pt c/o swelling: STAT is pt has developed SOB within 24 hours  1. How long have you been experiencing swelling? 1 day   2. Where is the swelling located? Top of the left foot  3.  Are you currently taking a "fluid pill"? Yes  4.  Are you currently SOB? No  5.  Have you traveled recently? No

## 2016-08-03 ENCOUNTER — Other Ambulatory Visit (HOSPITAL_COMMUNITY)
Admission: RE | Admit: 2016-08-03 | Discharge: 2016-08-03 | Disposition: A | Discharge status: Home / Self Care | Payer: Medicare Other | Source: Other Acute Inpatient Hospital | Attending: Family Medicine | Admitting: Family Medicine

## 2016-08-03 ENCOUNTER — Ambulatory Visit: Payer: Medicare Other | Admitting: Family Medicine

## 2016-08-03 ENCOUNTER — Ambulatory Visit (INDEPENDENT_AMBULATORY_CARE_PROVIDER_SITE_OTHER): Payer: Medicare Other | Admitting: Family Medicine

## 2016-08-03 ENCOUNTER — Encounter: Payer: Self-pay | Admitting: Family Medicine

## 2016-08-03 VITALS — BP 110/70 | HR 72 | Temp 99.3°F | Resp 14 | Ht 64.0 in | Wt 128.0 lb

## 2016-08-03 DIAGNOSIS — M25475 Effusion, left foot: Secondary | ICD-10-CM | POA: Insufficient documentation

## 2016-08-03 DIAGNOSIS — F0150 Vascular dementia without behavioral disturbance: Secondary | ICD-10-CM

## 2016-08-03 DIAGNOSIS — Z7901 Long term (current) use of anticoagulants: Secondary | ICD-10-CM

## 2016-08-03 DIAGNOSIS — M1A3790 Chronic gout due to renal impairment, unspecified ankle and foot, without tophus (tophi): Secondary | ICD-10-CM

## 2016-08-03 DIAGNOSIS — N183 Chronic kidney disease, stage 3 unspecified: Secondary | ICD-10-CM

## 2016-08-03 DIAGNOSIS — M1A9XX0 Chronic gout, unspecified, without tophus (tophi): Secondary | ICD-10-CM | POA: Insufficient documentation

## 2016-08-03 LAB — CBC WITH DIFFERENTIAL/PLATELET
Basophils Absolute: 0 10*3/uL (ref 0.0–0.1)
Basophils Relative: 0 %
Eosinophils Absolute: 0 10*3/uL (ref 0.0–0.7)
Eosinophils Relative: 0 %
HEMATOCRIT: 37 % (ref 36.0–46.0)
Hemoglobin: 12.2 g/dL (ref 12.0–15.0)
LYMPHS PCT: 10 %
Lymphs Abs: 0.7 10*3/uL (ref 0.7–4.0)
MCH: 29.1 pg (ref 26.0–34.0)
MCHC: 33 g/dL (ref 30.0–36.0)
MCV: 88.3 fL (ref 78.0–100.0)
MONO ABS: 1.1 10*3/uL — AB (ref 0.1–1.0)
MONOS PCT: 15 %
NEUTROS ABS: 5.4 10*3/uL (ref 1.7–7.7)
Neutrophils Relative %: 75 %
Platelets: 187 10*3/uL (ref 150–400)
RBC: 4.19 MIL/uL (ref 3.87–5.11)
RDW: 13.2 % (ref 11.5–15.5)
WBC: 7.2 10*3/uL (ref 4.0–10.5)

## 2016-08-03 LAB — URIC ACID: Uric Acid, Serum: 6.6 mg/dL (ref 2.3–6.6)

## 2016-08-03 LAB — SEDIMENTATION RATE: Sed Rate: 40 mm/hr — ABNORMAL HIGH (ref 0–22)

## 2016-08-03 MED ORDER — PREDNISONE 20 MG PO TABS: ORAL_TABLET | ORAL | 0 refills | Status: DC

## 2016-08-03 MED ORDER — CEPHALEXIN 250 MG PO CAPS: 250.0000 mg | ORAL_CAPSULE | Freq: Three times a day (TID) | ORAL | 0 refills | Status: DC

## 2016-08-03 NOTE — Assessment & Plan Note (Signed)
Anticipate acute gout flare in h/o similar episode 1 yr ago. Do need to r/o infection (cellulitis, infective arthritis). Check stat CBC, Urate, ESR across the street at [facility] lab.  Will Rx prednisone course. Will start keflex 253m TID to cover possible infection while we await labs. If labs not consistent with infective cause, will call to stop keflex. Avoid colchicine, NSAID in CKD.  Pt/daughter agree with plan. Will forward today's note to PCP and coumadin clinic.

## 2016-08-03 NOTE — Patient Instructions (Addendum)
I do think this is gout flare.  Treat with prednisone 8 day course. Start keflex antibiotic for possible infection - we may stop this depending on labs.  Pass by hospital today for labs to check for gout and infection.   Gout Gout is painful swelling that can occur in some of your joints. Gout is a type of arthritis. This condition is caused by having too much uric acid in your body. Uric acid is a chemical that forms when your body breaks down substances called purines. Purines are important for building body proteins. When your body has too much uric acid, sharp crystals can form and build up inside your joints. This causes pain and swelling. Gout attacks can happen quickly and be very painful (acute gout). Over time, the attacks can affect more joints and become more frequent (chronic gout). Gout can also cause uric acid to build up under your skin and inside your kidneys. What are the causes? This condition is caused by too much uric acid in your blood. This can occur because:  Your kidneys do not remove enough uric acid from your blood. This is the most common cause.  Your body makes too much uric acid. This can occur with some cancers and cancer treatments. It can also occur if your body is breaking down too many red blood cells (hemolytic anemia).  You eat too many foods that are high in purines. These foods include organ meats and some seafood. Alcohol, especially beer, is also high in purines.  A gout attack may be triggered by trauma or stress. What increases the risk? This condition is more likely to develop in people who:  Have a family history of gout.  Are female and middle-aged.  Are female and have gone through menopause.  Are obese.  Frequently drink alcohol, especially beer.  Are dehydrated.  Lose weight too quickly.  Have an organ transplant.  Have lead poisoning.  Take certain medicines, including aspirin, cyclosporine, diuretics, levodopa, and niacin.  Have  kidney disease or psoriasis.  What are the signs or symptoms? An attack of acute gout happens quickly. It usually occurs in just one joint. The most common place is the big toe. Attacks often start at night. Other joints that may be affected include joints of the feet, ankle, knee, fingers, wrist, or elbow. Symptoms may include:  Severe pain.  Warmth.  Swelling.  Stiffness.  Tenderness. The affected joint may be very painful to touch.  Shiny, red, or purple skin.  Chills and fever.  Chronic gout may cause symptoms more frequently. More joints may be involved. You may also have white or yellow lumps (tophi) on your hands or feet or in other areas near your joints. How is this diagnosed? This condition is diagnosed based on your symptoms, medical history, and physical exam. You may have tests, such as:  Blood tests to measure uric acid levels.  Removal of joint fluid with a needle (aspiration) to look for uric acid crystals.  X-rays to look for joint damage.  How is this treated? Treatment for this condition has two phases: treating an acute attack and preventing future attacks. Acute gout treatment may include medicines to reduce pain and swelling, including:  NSAIDs.  Steroids. These are strong anti-inflammatory medicines that can be taken by mouth (orally) or injected into a joint.  Colchicine. This medicine relieves pain and swelling when it is taken soon after an attack. It can be given orally or through an IV tube.  Preventive treatment may include:  Daily use of smaller doses of NSAIDs or colchicine.  Use of a medicine that reduces uric acid levels in your blood.  Changes to your diet. You may need to see a specialist about healthy eating (dietitian).  Follow these instructions at home: During a Gout Attack  If directed, apply ice to the affected area: ? Put ice in a plastic bag. ? Place a towel between your skin and the bag. ? Leave the ice on for 20 minutes,  2-3 times a day.  Rest the joint as much as possible. If the affected joint is in your leg, you may be given crutches to use.  Raise (elevate) the affected joint above the level of your heart as often as possible.  Drink enough fluids to keep your urine clear or pale yellow.  Take over-the-counter and prescription medicines only as told by your health care provider.  Do not drive or operate heavy machinery while taking prescription pain medicine.  Follow instructions from your health care provider about eating or drinking restrictions.  Return to your normal activities as told by your health care provider. Ask your health care provider what activities are safe for you. Avoiding Future Gout Attacks  Follow a low-purine diet as told by your dietitian or health care provider. Avoid foods and drinks that are high in purines, including liver, kidney, anchovies, asparagus, herring, mushrooms, mussels, and beer.  Limit alcohol intake to no more than 1 drink a day for nonpregnant women and 2 drinks a day for men. One drink equals 12 oz of beer, 5 oz of wine, or 1 oz of hard liquor.  Maintain a healthy weight or lose weight if you are overweight. If you want to lose weight, talk with your health care provider. It is important that you do not lose weight too quickly.  Start or maintain an exercise program as told by your health care provider.  Drink enough fluids to keep your urine clear or pale yellow.  Take over-the-counter and prescription medicines only as told by your health care provider.  Keep all follow-up visits as told by your health care provider. This is important. Contact a health care provider if:  You have another gout attack.  You continue to have symptoms of a gout attack after10 days of treatment.  You have side effects from your medicines.  You have chills or a fever.  You have burning pain when you urinate.  You have pain in your lower back or belly. Get help  right away if:  You have severe or uncontrolled pain.  You cannot urinate. This information is not intended to replace advice given to you by your health care provider. Make sure you discuss any questions you have with your health care provider. Document Released: 12/22/1999 Document Revised: 06/01/2015 Document Reviewed: 10/06/2014 Elsevier Interactive Patient Education  2017 [employer].

## 2016-08-03 NOTE — Assessment & Plan Note (Addendum)
Dx by xray last year, reviewed with patient.  She is on lasix. Continue to monitor for recurrence. Discussed low purine diet.

## 2016-08-03 NOTE — Progress Notes (Signed)
BP 110/70 (BP Location: Left Arm, Patient Position: Sitting, Cuff Size: Normal)   Pulse 72   Temp 99.3 F (37.4 C) (Oral)   Resp 14   Ht 5' 4"  (1.626 m)   Wt 128 lb (58.1 kg)   LMP  (LMP Unknown)   SpO2 98%   BMI 21.97 kg/m    CC: L swollen foot Subjective:    Patient ID: [name], female    DOB: 1945/04/18, 71 y.o.   MRN: [id number]  HPI: [name] is a 71 y.o. female presenting on 08/03/2016 for left foot swelling (started complaining yesterday and neighbor noticed on thursday; difficulty bearing weight )   Here with daughter Jonelle Sidle. H/o vascular dementia.  1d h/o L lateral foot swelling and redness. 2d ago neighbor noticed she was limping.  They tried leftover voltaren gel which may have helped some.   Stays cold. Denies fevers/chills, nausea, denies inciting trauma/injury to left leg.  Lab Results  Component Value Date   TSH 0.42 02/21/2015    No new medicines. No new foods (no shrimp, organ meat, alcohol).   1 yr ago had similar episode - treated with voltaren gel with improvement. On coumadin blood thinner.  No know h/o gout.   H/o stroke and heart valve surgeries. No h/o blood clots in the past.   Relevant past medical, surgical, family and social history reviewed and updated as indicated. Interim medical history since our last visit reviewed. Allergies and medications reviewed and updated. Outpatient Medications Prior to Visit  Medication Sig Dispense Refill  . aspirin EC 81 MG tablet Take 1 tablet (81 mg total) by mouth daily. 30 tablet 0  . atorvastatin (LIPITOR) 40 MG tablet Take 1 tablet (40 mg total) by mouth daily. 90 tablet 1  . carvedilol (COREG) 3.125 MG tablet TAKE 1 TABLET BY MOUTH 2 TIMES DAILY WITH A MEAL 60 tablet 1  . clindamycin (CLEOCIN) 300 MG capsule Take 2 capsules (600 mg total) by mouth as directed. Take one hour prior to dental work 2 capsule 0  . furosemide (LASIX) 20 MG tablet TAKE 1 TABLET BY MOUTH DAILY.  30 tablet 5  . hydrALAZINE (APRESOLINE) 50 MG tablet Take 1.5 tablets (75 mg total) by mouth 3 (three) times daily. 135 tablet 6  . isosorbide mononitrate (IMDUR) 30 MG 24 hr tablet Take 1 tablet (30 mg total) by mouth daily. 90 tablet 3  . pantoprazole (PROTONIX) 40 MG tablet Take 1 tablet (40 mg total) by mouth daily. 30 tablet 10  . saccharomyces boulardii (FLORASTOR) 250 MG capsule Take 1 capsule (250 mg total) by mouth 2 (two) times daily. 60 capsule 0  . warfarin (COUMADIN) 2 MG tablet Take 1 tablet by mouth daily or as directed by coumadin clinic 30 tablet 6   No facility-administered medications prior to visit.      Per HPI unless specifically indicated in ROS section below Review of Systems     Objective:    BP 110/70 (BP Location: Left Arm, Patient Position: Sitting, Cuff Size: Normal)   Pulse 72   Temp 99.3 F (37.4 C) (Oral)   Resp 14   Ht 5' 4"  (1.626 m)   Wt 128 lb (58.1 kg)   LMP  (LMP Unknown)   SpO2 98%   BMI 21.97 kg/m   Wt Readings from Last 3 Encounters:  08/03/16 128 lb (58.1 kg)  05/17/16 124 lb (56.2 kg)  05/06/16 128 lb (58.1 kg)  Physical Exam  Constitutional: She appears well-developed and well-nourished. No distress.  Musculoskeletal: She exhibits edema (L lateral foot).       Feet:  FROM at knees No pitting pedal edema No palpable cords 2+ DP on right, 1+ DP on left  Mild swelling/erythema and warmth of lateral dorsal left foot  Exquisite tenderness to palpation along area of edema/erythema  Nursing note and vitals reviewed.   Lab Results  Component Value Date   INR 2.2 08/02/2016   INR 2.0 07/19/2016   INR 1.8 07/05/2016   Lab Results  Component Value Date   CREATININE 1.90 (H) 05/17/2016    LEFT FOOT - COMPLETE 3+ VIEW COMPARISON:  None. FINDINGS: There is no evidence of fracture or dislocation. There is periarticular erosion involving the medial aspect of the first metatarsal head. There is mild overlying soft tissue  swelling. There are no other erosive changes.  Soft tissues are unremarkable. IMPRESSION: 1. No acute osseous injury of the left foot. 2. Periarticular erosion involving the medial aspect of the first metatarsal head with mild overlying soft tissue swelling as can be seen with gout. Electronically Signed   By: Kathreen Devoid   On: 05/04/2015 14:45    Assessment & Plan:   Problem List Items Addressed This Visit    Chronic anticoagulation   Chronic gout    Dx by xray last year, reviewed with patient.  She is on lasix. Continue to monitor for recurrence. Discussed low purine diet.        CKD (chronic kidney disease) stage 3, GFR 30-59 ml/min   Swelling of foot joint, left - Primary    Anticipate acute gout flare in h/o similar episode 1 yr ago. Do need to r/o infection (cellulitis, infective arthritis). Check stat CBC, Urate, ESR across the street at [facility] lab.  Will Rx prednisone course. Will start keflex 278m TID to cover possible infection while we await labs. If labs not consistent with infective cause, will call to stop keflex. Avoid colchicine, NSAID in CKD.  Pt/daughter agree with plan. Will forward today's note to PCP and coumadin clinic.       Relevant Orders   Sedimentation rate   CBC with Differential/Platelet   Uric acid   Vascular dementia, uncomplicated       Follow up plan: No Follow-up on file.  JRia Bush MD

## 2016-08-03 NOTE — Progress Notes (Signed)
Pre visit review using our clinic review tool, if applicable. No additional management support is needed unless otherwise documented below in the visit note. 

## 2016-08-04 ENCOUNTER — Telehealth: Payer: Self-pay | Admitting: Family Medicine

## 2016-08-04 NOTE — Telephone Encounter (Signed)
plz call daughter [name] - labs returned not consistent with infection - ok to stop keflex. I still think this is gout flare. Let us know if not improving with prednisone treatment.  Labs didn't come to me, I had to search for them. Not sure why.

## 2016-08-05 ENCOUNTER — Telehealth: Payer: Self-pay | Admitting: Pharmacist

## 2016-08-05 NOTE — Telephone Encounter (Signed)
Lm on [name]'s vm requesting a call back

## 2016-08-05 NOTE — Telephone Encounter (Signed)
Ms [name] was started on Keflex and prednisone on Friday 7/27.    LMOM; patient needs f/u appt for INR check this week due to DDI with prednisone.   Daughter to call back for appointment.

## 2016-08-05 NOTE — Telephone Encounter (Signed)
Spoke to [employer] and advised per Dr Reece Agar. [name] states predisone "is definitely working and the swelling and redness is 80% better."

## 2016-08-07 ENCOUNTER — Ambulatory Visit: Payer: Medicare Other | Admitting: Pulmonary Disease

## 2016-08-07 ENCOUNTER — Ambulatory Visit: Payer: Medicare Other

## 2016-08-09 ENCOUNTER — Ambulatory Visit (INDEPENDENT_AMBULATORY_CARE_PROVIDER_SITE_OTHER): Payer: Medicare Other | Admitting: Pharmacist Clinician (PhC)/ Clinical Pharmacy Specialist

## 2016-08-09 DIAGNOSIS — Z953 Presence of xenogenic heart valve: Secondary | ICD-10-CM

## 2016-08-09 DIAGNOSIS — Z952 Presence of prosthetic heart valve: Secondary | ICD-10-CM | POA: Diagnosis not present

## 2016-08-09 DIAGNOSIS — Z7901 Long term (current) use of anticoagulants: Secondary | ICD-10-CM | POA: Diagnosis not present

## 2016-08-09 LAB — POCT INR: INR: 2.3

## 2016-08-12 ENCOUNTER — Telehealth: Payer: Self-pay | Admitting: Internal Medicine

## 2016-08-12 MED ORDER — WARFARIN SODIUM 2 MG PO TABS: ORAL_TABLET | ORAL | 5 refills | Status: DC

## 2016-08-12 NOTE — Telephone Encounter (Signed)
New message    Can she get refills on this so she doesn't have to call in every time she needs it   *STAT* If patient is at the pharmacy, call can be transferred to refill team.   1. Which medications need to be refilled? (please list name of each medication and dose if known) warafrin   2. Which pharmacy/location (including street and city if local pharmacy) is medication to be sent to? [facility]   3. Do they need a 30 day or 90 day supply? 30

## 2016-08-13 ENCOUNTER — Ambulatory Visit (INDEPENDENT_AMBULATORY_CARE_PROVIDER_SITE_OTHER): Payer: Medicare Other | Admitting: Neurology

## 2016-08-13 ENCOUNTER — Encounter: Payer: Self-pay | Admitting: Neurology

## 2016-08-13 VITALS — BP 97/54 | HR 50 | Wt 128.0 lb

## 2016-08-13 DIAGNOSIS — Z953 Presence of xenogenic heart valve: Secondary | ICD-10-CM | POA: Diagnosis not present

## 2016-08-13 DIAGNOSIS — I63411 Cerebral infarction due to embolism of right middle cerebral artery: Secondary | ICD-10-CM

## 2016-08-13 DIAGNOSIS — I480 Paroxysmal atrial fibrillation: Secondary | ICD-10-CM

## 2016-08-13 DIAGNOSIS — I679 Cerebrovascular disease, unspecified: Secondary | ICD-10-CM

## 2016-08-13 DIAGNOSIS — E785 Hyperlipidemia, unspecified: Secondary | ICD-10-CM

## 2016-08-13 DIAGNOSIS — Z952 Presence of prosthetic heart valve: Secondary | ICD-10-CM

## 2016-08-13 DIAGNOSIS — Z7901 Long term (current) use of anticoagulants: Secondary | ICD-10-CM

## 2016-08-13 NOTE — Patient Instructions (Addendum)
-   continue coumadin and ASA and lipitor for now for stroke prevention - follow up with INR check goal 2-3 - your BP today is low. Please check BP at home and record and bring over to Dr. Rennis Golden on 08/30/16 for medication adjustment if needed. - Follow up with your primary care physician for stroke risk factor modification. Recommend maintain blood pressure goal <130/80, diabetes with hemoglobin A1c goal below 6.5% and lipids with LDL cholesterol goal below 70 mg/dL.  - healthy diet and regular exercise.  - follow up as needed.

## 2016-08-13 NOTE — Progress Notes (Signed)
STROKE NEUROLOGY FOLLOW UP NOTE  NAME: [name] DOB: 09/09/1945  REASON FOR VISIT: stroke follow up HISTORY FROM: daughter and chart and [name]  Today we had Margaret pleasure of seeing [name] in follow-up at our Neurology Clinic. [name] was accompanied by daughter.   History Summary [name] is a 71 y.o. 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 MRI showed right BG and right caudate head infarcts, embolic pattern. MRA showed severe posterior athero with BA occlusion, and left VA and P2 occlusion. CUS showed left subclavian steal syndrome. TTE showed EF 50-55% improved from prior 25-30%. LDL 107 and A1C 5.8. [name] INR was 1.65 on admission. [name] coumadin continued and INR on discharge 2.78. Due to severe posterior athero, [name] was put on ASA 81 on top of coumadin. Put on lipitor at discharge home.   04/25/2015 follow up - [name] has been doing well. No residue symptoms. No recurrent stroke like symptoms. INR still difficulty to control, but last INR 2.4. [name] is also on ASA without bleeding. BP was high today 168/80 but [name] did not check BP at home. [name] is on coreg, lasix, spironolactone, and hydralazine.  09/29/15 follow up - he has been doing well, without back symptoms. Follow up with Dr. Rennis Golden in cardiology, still on Coumadin for anticoagulation. Recent check INR supratherapeutic, INR 5.0. Overall, INR still not in good control. BP much better controlled than before, today 142/71. On multiple blood pressure medications.  Interval History During Margaret interval time, [name] has been doing well. As per [name], [name] discussed with Dr. Rennis Golden about anticoagulation choices and would like to stay with coumadin. Last INR check therapeutic 2.3. Still on coumadin, ASA and lipitor.  However, [name] BP today was low at 97/54. [name] is on coreg, lasix, hydralazine, imdur currently. Off amiodarone due to lung toxicity. As per daughter, [name] not active at home, doing housework but not exercise. Otherwise, [name] is doing well.   REVIEW OF SYSTEMS: Full 14 system review of systems performed and notable only for those listed below and in HPI above, all others are negative:  Constitutional:   Cardiovascular:  Ear/Nose/Throat:   Skin:  Eyes:   Respiratory:   Gastroitestinal:   Genitourinary:  Hematology/Lymphatic:   Endocrine:  Musculoskeletal:   Allergy/Immunology:   Neurological:   Psychiatric:  Sleep:   Margaret following represents [name]'s updated allergies and side effects list: Allergies  Allergen Reactions  . Augmentin [Amoxicillin-Pot Clavulanate] Other (See Comments)    Unknown reaction      Margaret neurologically relevant items on [name]'s problem list were reviewed on today's visit.  Neurologic Examination  A problem focused neurological exam (12 or more points of Margaret single system neurologic examination, vital signs counts as 1 point, cranial nerves count for 8 points) was performed.  Blood pressure (!) 97/54, pulse (!) 50, weight 128 lb (58.1 kg).  General - Well nourished, well developed, in no apparent distress.  Ophthalmologic - Sharp disc margins OU.   Cardiovascular - Regular rate and rhythm.  Mental Status -  Level of arousal and orientation to time, place, and person were intact. Language including expression, naming, repetition, comprehension was assessed and found intact. Fund of Knowledge was assessed and was intact.  Cranial Nerves II - XII - II -  Visual field intact OU. III, IV, VI - Extraocular movements intact. V - Facial sensation intact bilaterally. VII - Facial movement intact bilaterally. VIII - Hearing & vestibular intact bilaterally. X - Palate elevates symmetrically XI - Chin turning & shoulder shrug intact  bilaterally. XII - Tongue protrusion intact.  Motor Strength - [name]'s strength was normal in all extremities and pronator drift was absent.  Bulk was normal and fasciculations were absent.   Motor Tone - Muscle tone was assessed at Margaret neck and appendages and was normal.  Reflexes - [name]'s reflexes were 1+ in all extremities and [name] had no pathological reflexes.  Sensory - Light touch, temperature/pinprick were assessed and were normal.    Coordination - [name] had normal movements in Margaret hands and feet with no ataxia or dysmetria.  Tremor was absent.  Gait and Station - [name]'s transfers, posture, gait, station, and turns were observed as normal.   Data reviewed: I personally reviewed Margaret images and agree with Margaret radiology interpretations.  Ct Head Wo Contrast 02/21/2015 Low-density within Margaret right basal ganglia compatible with age-indeterminate infarct. This is suspicious for acute to subacute infarct. 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   Carotid Doppler  Bilateral: 1-39% ICA stenosis. Right: Vertebral artery flow is antegrade. Left: Vertebral artery retrograde suggesting left subclavian steal.   2D Echocardiogram  - Left ventricle: Margaret cavity size was normal. Systolic function wasnormal. Margaret estimated ejection fraction was in Margaret range of 50%to 55%. Doppler parameters are consistent with abnormal leftventricular relaxation (grade 1 diastolic dysfunction). - Aortic valve: There was mild regurgitation. - Left atrium: Margaret atrium was normal in size. - Right ventricle: Margaret cavity size was mildly dilated. Wallthickness was normal. Systolic function was moderately reduced. - Tricuspid valve: There was moderate regurgitation. - Pulmonic valve: There was moderate regurgitation. - Pulmonary arteries: Systolic pressure was mildly increased. PApeak pressure: 41 mm Hg (S). - Pericardium, extracardiac: There was no pericardial  effusion. Impressions: - When compared to Margaret prior study from 12/14/2014 LVEF has improvedfom 25-30% to low normal 50-55%.RVEF is moderate ly reduced. There is mild pulmonaryhypertension.  MRI and MRA head MRI HEAD: Acute RIGHT basal ganglia infarct. Multiple cerebellar infarcts, small RIGHT occipital lobe encephalomalacia/infarct. Old mesial LEFT thalamus lacunar infarct. Scattered susceptibility artifact in a distribution compatible with chronic hypertension. Mild chronic small vessel ischemic disease. MRA HEAD: Occluded LEFT vertebral artery, occluded basilar artery is which are likely chronic considering posterior circulation distribution old infarcts. Complete circle of Willis. Occluded LEFT posterior cerebral artery at P2 segment. High-grade stenosis RIGHT A1 segment, patent anterior communicating artery. Moderate stenosis RIGHT M1 segment. General mild luminal regularity of Margaret vessels compatible with atherosclerosis.  Component     Latest Ref Rng 02/21/2015 02/22/2015 02/23/2015  Cholesterol     0 - 200 mg/dL  161   Triglycerides     <150 mg/dL  33   HDL Cholesterol     >40 mg/dL  67   Total CHOL/HDL Ratio       2.7   VLDL     0 - 40 mg/dL  7   LDL (calc)     0 - 99 mg/dL  096 (H)   Prothrombin Time     11.6 - 15.2 seconds 19.5 (H) 23.1 (H) 28.9 (H)  INR     0.00 - 1.49 1.65 (H) 2.06 (H) 2.78 (H)  Hemoglobin A1C     4.8 - 5.6 %  5.8 (H)   Mean Plasma  Glucose       120    Component     Latest Ref Rng & Units 05/17/2016  Cholesterol     0 - 200 mg/dL 161 (H)  Triglycerides     0.0 - 149.0 mg/dL 09.6  HDL Cholesterol     >39.00 mg/dL 04.54  VLDL     0.0 - 09.8 mg/dL 11.9  LDL (calc)     0 - 99 mg/dL 147 (H)  Total CHOL/HDL Ratio      2  NonHDL      117.96  Hemoglobin A1C     4.6 - 6.5 % 5.5    Assessment: As you may recall, [name] is a 71 y.o. 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 MRI showed right BG and right caudate head infarcts, embolic pattern. MRA showed severe posterior athero with BA occlusion, and left VA and P2 occlusion. CUS showed left subclavian steal syndrome. TTE showed EF 50-55% improved from prior 25-30%. LDL 107 and A1C 5.8. [name] INR was 1.65 on admission. [name] coumadin continued and INR on discharge 2.78. Due to severe posterior athero, [name] was put on ASA 81 on top of coumadin. During Margaret interval time, [name] has been doing well. INR initially difficult to control, but now much better. BP low today on 4 BP meds, need close check and record. [name] will see Dr. Rennis Golden on 08/30/16.   Plan:  - continue coumadin and ASA and lipitor for stroke prevention - follow up with INR check goal 2-3 - BP today is low. Please check BP at home and record and bring over to Dr. Rennis Golden on 08/30/16 for medication adjustment if needed. - Follow up with your primary care physician for stroke risk factor modification. Recommend maintain blood pressure goal <130/80, diabetes with hemoglobin A1c goal below 6.5% and lipids with LDL cholesterol goal below 70 mg/dL.  - healthy diet and regular exercise.  - follow up as needed.  I spent more than 25 minutes of face to face time with [name]. Greater than 50% of time was spent in counseling and coordination of care. We discussed continue coumadin, exercise at home, and blood pressure monitoring.   No orders of Margaret defined types were placed in this encounter.    No orders of Margaret defined types were placed in this encounter.   Rodriguez Instructions  - continue coumadin and ASA and lipitor for now for stroke prevention - follow up with INR check goal 2-3 - your BP today is low. Please check BP at home and record and bring over to Dr. Rennis Golden on 08/30/16 for medication adjustment if needed. - Follow up with  your primary care physician for stroke risk factor modification. Recommend maintain blood pressure goal <130/80, diabetes with hemoglobin A1c goal below 6.5% and lipids with LDL cholesterol goal below 70 mg/dL.  - healthy diet and regular exercise.  - follow up as needed.   Marvel Plan, MD PhD [facility] [address] [place], [place] [address] [phone]

## 2016-08-16 ENCOUNTER — Ambulatory Visit (INDEPENDENT_AMBULATORY_CARE_PROVIDER_SITE_OTHER): Payer: Medicare Other | Admitting: Pharmacist

## 2016-08-16 DIAGNOSIS — Z953 Presence of xenogenic heart valve: Secondary | ICD-10-CM

## 2016-08-16 DIAGNOSIS — Z952 Presence of prosthetic heart valve: Secondary | ICD-10-CM | POA: Diagnosis not present

## 2016-08-16 DIAGNOSIS — Z7901 Long term (current) use of anticoagulants: Secondary | ICD-10-CM | POA: Diagnosis not present

## 2016-08-16 DIAGNOSIS — I63411 Cerebral infarction due to embolism of right middle cerebral artery: Secondary | ICD-10-CM | POA: Diagnosis not present

## 2016-08-16 LAB — POCT INR: INR: 1.9

## 2016-08-30 ENCOUNTER — Ambulatory Visit (INDEPENDENT_AMBULATORY_CARE_PROVIDER_SITE_OTHER): Payer: Medicare Other | Admitting: Pharmacist Clinician (PhC)/ Clinical Pharmacy Specialist

## 2016-08-30 ENCOUNTER — Ambulatory Visit (INDEPENDENT_AMBULATORY_CARE_PROVIDER_SITE_OTHER): Payer: Medicare Other | Admitting: Internal Medicine

## 2016-08-30 ENCOUNTER — Encounter: Payer: Self-pay | Admitting: Internal Medicine

## 2016-08-30 VITALS — BP 160/80 | HR 61 | Ht 64.0 in | Wt 126.0 lb

## 2016-08-30 DIAGNOSIS — Z953 Presence of xenogenic heart valve: Secondary | ICD-10-CM | POA: Diagnosis not present

## 2016-08-30 DIAGNOSIS — I4891 Unspecified atrial fibrillation: Secondary | ICD-10-CM | POA: Diagnosis not present

## 2016-08-30 DIAGNOSIS — Z7901 Long term (current) use of anticoagulants: Secondary | ICD-10-CM

## 2016-08-30 DIAGNOSIS — I5022 Chronic systolic (congestive) heart failure: Secondary | ICD-10-CM

## 2016-08-30 DIAGNOSIS — I63411 Cerebral infarction due to embolism of right middle cerebral artery: Secondary | ICD-10-CM | POA: Diagnosis not present

## 2016-08-30 DIAGNOSIS — Z952 Presence of prosthetic heart valve: Secondary | ICD-10-CM

## 2016-08-30 LAB — POCT INR: INR: 1.4

## 2016-08-30 NOTE — Progress Notes (Addendum)
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 She ultimately had severe mitral regurgitation and destruction of the mitral valve necessitating emergent replacement with a 29 mm mechanical St. Jude valve by Dr. Cornelius Moras. Since that time she's done very well. She continues to exercise and did not get any shortness of breath or chest pain. She also has dyslipidemia and hypertension which is well managed. She is chronically on warfarin therapy which is followed in this office.  Her last echocardiogram to evaluate valvular gradients was in 2011 which showed an EF of  greater than 55%. The gradients across the valve were 9 and 4.9 mm Hg.  I saw [name] back in the office today. She is doing extremely well. She denies any shortness of breath or worsening chest pain. She continues to exercise. Her weight is appropriate. [name] was mildly elevated today and is followed by [name]. Her mitral valve apparently is working well. Her last echocardiogram in 2015 showed stable gradients and normal LV function. She is due for cholesterol reassessment. Of note her blood pressure is elevated today. She says that she's been out of her medicines for a little while and that there were no refills that were authorized, however our system indicates that they are available.  [name] returns today for follow-up from her recent hospitalization. She was admitted for congestive heart failure. An echocardiogram showed an EF of 25-30%. This represents an acute decline from her preoperative EF which was normal this past summer. Unfortunately the summer she developed valve thrombosis and had to have a redo mitral valve surgery. She was doing fairly well for that for while and then developed worsening shortness of breath and was  found to have cardiomyopathy. It's unclear whether her EF drop was perioperative course at some later time. She was diuresed and treated by the advanced heart failure service. She had a right heart catheterization and since her hospitalization has been doing better. She does not appear to have had recurrent atrial fibrillation and is been on amiodarone therapy as well as warfarin anticoagulation. She says she has had a productive cough for the last several weeks after an upper respiratory infection but it is improving. Blood pressure today is noted to be very low at 94/72. Her weight is actually 5 pounds lower than her discharge weight.  05/19/2015  [name] returns today for follow-up. Overall she's been doing fairly well. Blood pressure has been running higher recently. Her hydralazine is at 50 mg 3 times a day. She's also on carvedilol 3125 twice a day however heart rate is in the low 60s. She appears euvolemic and her weight is been stable. She is taking amiodarone without recurrent A. fib. I had a discussion with her neurologist about her anticoagulation. She is interested in her going on to a novel oral anticoagulant, however I stressed the fact that there is little data and no FDA approval for use of those medications in patients with valvular A. fib. I would be concerned about making that switch. I feel that with better attempts at compliance we would be able to get more regulated INR levels. In addition, her bioprosthetic valve is much less likely at this point to thrombose (essentially the warfarin is for a-fib).  12/22/2015  [name] was seen today in  follow-up. She reports doing fairly well. Her daughter was accompanying her today and has been helping her with her medications. She's been placing her warfarin into her pill boxes and that is helped with compliance. INRs have recently been very well controlled. She does have valvular A. fib and as previously mentioned, the novel  oral anticoagulants are not FDA approved for valvular atrial fibrillation. I would feel more comfortable if we continue with warfarin. She denies any worsening heart failure symptoms and remains on low-dose furosemide. Blood pressure was mildly elevated today however she did not take her morning medications. She is maintaining sinus rhythm and is had not recurrence of atrial fibrillation on low-dose amiodarone. She is due for repeat thyroid, liver function and pulmonary function tests.  08/30/2016  [name] was seen today in follow-up. We're now checking her INRs every 2-3 weeks due to significant lability. There is been a history of memory loss and confusion and we suspect noncompliance with actually taking her medicines. Although the medicines are laid out, it is not always confirmed that she is taking the pills. Unfortunately today her INR was very low 1.4. Her daughter mentioned that she had been staying with her son for the past week. Although overall she is asymptomatic and her asked echo was in February which showed a normal valve gradient and an improved ejection fraction is 60-65%. She was supposed to undergo dental surgery but has not yet had that. She will need antibiotic prophylaxis and is written for clindamycin.  PMHx:  Past Medical History:  Diagnosis Date  . Acute CHF (congestive heart failure) (HCC) 12/14/2014  . Hyperlipidemia   . Hypertension   . 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 S/P MVR (mitral valve replacement) 04/11/1998   #29 St. Jude bileaflet mechanical valve for bacterial endocarditis  . S/P redo mitral valve replacement with bioprosthetic valve 07/25/2014   27 mm [facility] Mitral bovine bioprosthetic tissue valve  . Stroke (HCC)   . 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 Valvular endocarditis 2000   Streptococcus    Past Surgical  History:  Procedure Laterality Date  . ABDOMINAL HYSTERECTOMY    . CARDIAC CATHETERIZATION N/A 07/24/2014   Procedure: Right/Left Heart Cath and Coronary Angiography;  Surgeon: Iran Ouch, MD;  Location: [facility] INVASIVE CV LAB;  Service: Cardiovascular;  Laterality: N/A;  . CARDIAC CATHETERIZATION N/A 07/22/2014   Procedure: Fluoroscopy Guidance;  Surgeon: Lennette Bihari, MD;  Location: [facility] INVASIVE CV LAB;  Service: Cardiovascular;  Laterality: N/A;  . CARDIAC CATHETERIZATION N/A 12/16/2014   Procedure: Right Heart Cath;  Surgeon: Laurey Morale, MD;  [facility] INVASIVE CV LAB;  Service: Cardiovascular;  Laterality: N/A;  . CARDIAC VALVE REPLACEMENT    . MITRAL VALVE REPLACEMENT  04/11/1998   St. Jude mechanical MVR (severe MR, episode of streptococcal bacterial endocarditis - Dr. Cornelius Moras  . MITRAL VALVE REPLACEMENT N/A 07/25/2014   Procedure: REDO MITRAL VALVE REPLACEMENT (MVR);  Surgeon: Purcell Nails, MD;  Location: [facility] OR;  Service: Open Heart Surgery;  Laterality: N/A;  . TOTAL ABDOMINAL HYSTERECTOMY W/ BILATERAL SALPINGOOPHORECTOMY  12/14/1998  . TRANSTHORACIC ECHOCARDIOGRAM  11/2009   EF=>55%; bi-leaflet St. Jude mech prosthesis; mild TR, RVSP elevated at 40-65mmHg, mod pulm HTN; trace AV regurg; mild pulm valve regurg  . TUBAL LIGATION      FAMHx:  Family History  Problem Relation Age of Onset  .  Lung cancer Mother   . Heart attack Mother   . Heart failure Maternal Grandmother   . Heart failure Maternal Grandfather   . Stroke Maternal Grandfather   . Stroke Father   . Colon cancer Father   . Diabetes Father   . Stroke Sister   . Lung disease Neg Hx     SOCHx:   reports that she quit smoking about 43 years ago. Her smoking use included Cigarettes. She smoked 0.10 packs per day. She has never used smokeless tobacco. She reports that she does not drink alcohol or use drugs.  ALLERGIES:  Allergies  Allergen Reactions  . Augmentin [Amoxicillin-Pot Clavulanate] Other (See  Comments)    Unknown reaction Has patient had a PCN reaction causing immediate rash, facial/tongue/throat swelling, SOB or lightheadedness with hypotension Has patient had a PCN reaction causing severe rash involving mucus membranes or skin necrosis Has patient had a PCN reaction that required hospitalization  Has patient had a PCN reaction occurring within the last 10 years] If all of the above answers are "NO", then may proceed with Cephalosporin use.     ROS: Pertinent items noted in HPI and remainder of comprehensive ROS otherwise negative.  HOME MEDS: Current Outpatient Prescriptions  Medication Sig Dispense Refill  . aspirin EC 81 MG tablet Take 1 tablet (81 mg total) by mouth daily. 30 tablet 0  . atorvastatin (LIPITOR) 40 MG tablet Take 1 tablet (40 mg total) by mouth daily. 90 tablet 1  . carvedilol (COREG) 3.125 MG tablet TAKE 1 TABLET BY MOUTH 2 TIMES DAILY WITH A MEAL 60 tablet 1  . clindamycin (CLEOCIN) 300 MG capsule Take 2 capsules (600 mg total) by mouth as directed. Take one hour prior to dental work 2 capsule 0  . furosemide (LASIX) 20 MG tablet TAKE 1 TABLET BY MOUTH DAILY. 30 tablet 5  . hydrALAZINE (APRESOLINE) 50 MG tablet Take 1.5 tablets (75 mg total) by mouth 3 (three) times daily. 135 tablet 6  . isosorbide mononitrate (IMDUR) 30 MG 24 hr tablet Take 1 tablet (30 mg total) by mouth daily. 90 tablet 3  . pantoprazole (PROTONIX) 40 MG tablet Take 1 tablet (40 mg total) by mouth daily. 30 tablet 10  . saccharomyces boulardii (FLORASTOR) 250 MG capsule Take 1 capsule (250 mg total) by mouth 2 (two) times daily. 60 capsule 0  . warfarin (COUMADIN) 2 MG tablet Take 1 tablet by mouth daily or as directed by coumadin clinic 30 tablet 5   No current facility-administered medications for this visit.     LABS/IMAGING: No results found for this or any previous visit (from the past 48 hour(s)). No results found.  VITALS: BP (!) 160/80   Pulse 61   Ht 5\' 4"  (1.626 m)    Wt 126 lb (57.2 kg)   LMP  (LMP Unknown)   BMI 21.63 kg/m   EXAM: General appearance: alert and no distress Neck: no carotid bruit, no JVD and thyroid not enlarged, symmetric, no tenderness/mass/nodules Lungs: clear to auscultation bilaterally Heart: regular rate and rhythm, S1, S2 normal and systolic murmur: early systolic 2/6, crescendo at lower left sternal border Abdomen: soft, non-tender; bowel sounds normal; no masses,  no organomegaly Extremities: extremities normal, atraumatic, no cyanosis or edema Pulses: 2+ and symmetric Skin: Skin color, texture, turgor normal. No rashes or lesions Neurologic: Mental status: Awake, follows commands Psych: Pleasant  EKG: Normal sinus rhythm 61, RBBB - personally reviewed  ASSESSMENT: 1. History of 29 mm St. Jude  mechanical mitral valve replacement (2000) for SBE with valve thrombosis and subsequent re-do MVR with a 27 mm Edwards Magna-Ease bioprosthetic valve 2. Acute systolic congestive heart failure-EF 25-30% (improved to 60-65% by echo in 02/2016) 3. HTN 4. Dyslipidemia 5. Paroxysmal atrial fibrillation on amiodarone - CHADSVASC score of 6 6. Chronic anticoagulation on warfarin 7. Right bundle branch block 8. Question memory loss/confusion  PLAN: 1.   [name] is asymptomatic, however, her INR Is low again today at 1.4 - there is likely a compliance issue which is being addressed with her family. Although the pills are arranged, it is not guaranteed she is taking it. Fortunately her LVEF has normalized. Blood pressure is at goal. No changes to her medications at this time. Plan follow-up in 6 months or sooner as necessary.  Chrystie Nose, MD, [facility] Attending Cardiologist [facility]  Chrystie Nose 08/30/2016, 12:36 PM

## 2016-08-30 NOTE — Patient Instructions (Signed)
Your physician wants you to follow-up in: 6 months with Dr. Rennis Golden. You will receive a reminder letter in the mail two months in advance. If you don't receive a letter, please call our office to schedule the follow-up appointment.  Dr. Rennis Golden recommends monitoring your blood pressure at home -- please check your BP no more than twice daily, after you have been sitting/resting for 5-10 minutes, at least 1 hour after taking your BP medications

## 2016-09-13 ENCOUNTER — Ambulatory Visit (INDEPENDENT_AMBULATORY_CARE_PROVIDER_SITE_OTHER): Payer: Medicare Other | Admitting: Pharmacist Clinician (PhC)/ Clinical Pharmacy Specialist

## 2016-09-13 DIAGNOSIS — Z7901 Long term (current) use of anticoagulants: Secondary | ICD-10-CM

## 2016-09-13 DIAGNOSIS — I63411 Cerebral infarction due to embolism of right middle cerebral artery: Secondary | ICD-10-CM | POA: Diagnosis not present

## 2016-09-13 DIAGNOSIS — Z953 Presence of xenogenic heart valve: Secondary | ICD-10-CM | POA: Diagnosis not present

## 2016-09-13 DIAGNOSIS — I4891 Unspecified atrial fibrillation: Secondary | ICD-10-CM | POA: Diagnosis not present

## 2016-09-13 DIAGNOSIS — Z952 Presence of prosthetic heart valve: Secondary | ICD-10-CM

## 2016-09-13 LAB — POCT INR: INR: 1.8

## 2016-09-30 ENCOUNTER — Ambulatory Visit (INDEPENDENT_AMBULATORY_CARE_PROVIDER_SITE_OTHER): Payer: Medicare Other | Admitting: Pharmacist Clinician (PhC)/ Clinical Pharmacy Specialist

## 2016-09-30 DIAGNOSIS — Z7901 Long term (current) use of anticoagulants: Secondary | ICD-10-CM | POA: Diagnosis not present

## 2016-09-30 DIAGNOSIS — I4891 Unspecified atrial fibrillation: Secondary | ICD-10-CM | POA: Diagnosis not present

## 2016-09-30 DIAGNOSIS — Z953 Presence of xenogenic heart valve: Secondary | ICD-10-CM

## 2016-09-30 DIAGNOSIS — Z952 Presence of prosthetic heart valve: Secondary | ICD-10-CM

## 2016-09-30 LAB — POCT INR: INR: 1.7

## 2016-10-03 ENCOUNTER — Telehealth: Payer: Self-pay | Admitting: Pulmonary Disease

## 2016-10-03 NOTE — Telephone Encounter (Signed)
ATC pt, no answer. Left message for pt to call back.  

## 2016-10-03 NOTE — Progress Notes (Signed)
Subjective:    Patient ID: [name], female    DOB: [birth year], 71 y.o.   MRN: [id number]  C.C.:  Follow-up for Amiodarone Lung Toxicity.  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 She feels her breathing is doing "fine". She reports she has had an intermittent cough that is only intermittent. Minimal mucus production. No wheezing.  Review of Systems No chest pain or pressure. No fever or chills. No abdominal pain or nausea. Patient does endorse increased seasonal sinus congestion and drainage but has not been taking any medication. Denies any sinus pain or pressure.  Allergies  Allergen Reactions  . Augmentin [Amoxicillin-Pot Clavulanate] Other (See Comments)    Unknown reaction Has patient had a PCN reaction causing immediate rash, facial/tongue/throat swelling, SOB or lightheadedness with hypotension Has patient had a PCN reaction causing severe rash involving mucus membranes or skin necrosis Has patient had a PCN reaction that required hospitalization  Has patient had a PCN reaction occurring within the last 10 years] If all of the above answers are "NO", then may proceed with Cephalosporin use.     Current Outpatient Prescriptions on File Prior to Visit  Medication Sig Dispense Refill  . aspirin EC 81 MG tablet Take 1 tablet (81 mg total) by mouth daily. 30 tablet 0  . atorvastatin (LIPITOR) 40 MG tablet Take 1 tablet (40 mg total) by mouth daily. 90 tablet 1  . carvedilol (COREG) 3.125 MG tablet TAKE 1 TABLET BY MOUTH 2 TIMES DAILY WITH A MEAL 60 tablet 1  . clindamycin (CLEOCIN) 300 MG capsule Take 2 capsules (600 mg total) by mouth as directed. Take one hour prior to dental work 2 capsule 0  . furosemide (LASIX) 20 MG tablet TAKE 1 TABLET BY MOUTH DAILY. 30 tablet 5  . hydrALAZINE (APRESOLINE) 50 MG tablet Take 1.5 tablets (75 mg total) by mouth 3 (three) times daily. 135 tablet  6  . isosorbide mononitrate (IMDUR) 30 MG 24 hr tablet Take 1 tablet (30 mg total) by mouth daily. 90 tablet 3  . pantoprazole (PROTONIX) 40 MG tablet Take 1 tablet (40 mg total) by mouth daily. 30 tablet 10  . saccharomyces boulardii (FLORASTOR) 250 MG capsule Take 1 capsule (250 mg total) by mouth 2 (two) times daily. 60 capsule 0  . warfarin (COUMADIN) 2 MG tablet Take 1 tablet by mouth daily or as directed by coumadin clinic 30 tablet 5   No current facility-administered medications on file prior to visit.     Past Medical History:  Diagnosis Date  . Acute CHF (congestive heart failure) (HCC) 12/14/2014  . Hyperlipidemia   . Hypertension   . 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 S/P MVR (mitral valve replacement) 04/11/1998   #29 St. Jude bileaflet mechanical valve for bacterial endocarditis  . S/P redo mitral valve replacement with bioprosthetic valve 07/25/2014   27 mm [facility] Mitral bovine bioprosthetic tissue valve  . Stroke (HCC)   . 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 Valvular endocarditis 2000   Streptococcus    Past Surgical History:  Procedure Laterality Date  . ABDOMINAL HYSTERECTOMY    . CARDIAC CATHETERIZATION N/A 07/24/2014   Procedure: Right/Left Heart Cath and Coronary Angiography;  Surgeon: Iran Ouch, MD;  Location: [facility] INVASIVE CV LAB;  Service: Cardiovascular;  Laterality: N/A;  . CARDIAC  CATHETERIZATION N/A 07/22/2014   Procedure: Fluoroscopy Guidance;  Surgeon: Lennette Bihari, MD;  Location: [facility] INVASIVE CV LAB;  Service: Cardiovascular;  Laterality: N/A;  . CARDIAC CATHETERIZATION N/A 12/16/2014   Procedure: Right Heart Cath;  Surgeon: Laurey Morale, MD;  Location: [facility] INVASIVE CV LAB;  Service: Cardiovascular;  Laterality: N/A;  . CARDIAC VALVE REPLACEMENT    . MITRAL VALVE REPLACEMENT  04/11/1998   St. Jude mechanical MVR  (severe MR, episode of streptococcal bacterial endocarditis - Dr. Cornelius Moras  . MITRAL VALVE REPLACEMENT N/A 07/25/2014   Procedure: REDO MITRAL VALVE REPLACEMENT (MVR);  Surgeon: Purcell Nails, MD;  Location: [facility] OR;  Service: Open Heart Surgery;  Laterality: N/A;  . TOTAL ABDOMINAL HYSTERECTOMY W/ BILATERAL SALPINGOOPHORECTOMY  12/14/1998  . TRANSTHORACIC ECHOCARDIOGRAM  11/2009   EF=>55%; bi-leaflet St. Jude mech prosthesis; mild TR, RVSP elevated at 40-8mmHg, mod pulm HTN; trace AV regurg; mild pulm valve regurg  . TUBAL LIGATION      Family History  Problem Relation Age of Onset  . Lung cancer Mother   . Heart attack Mother   . Heart failure Maternal Grandmother   . Heart failure Maternal Grandfather   . Stroke Maternal Grandfather   . Stroke Father   . Colon cancer Father   . Diabetes Father   . Stroke Sister   . Lung disease Neg Hx     Social History   Social History  . Marital status: Divorced    Spouse name: N/A  . Number of children: 3  . Years of education: 14   Occupational History  .       Social History Main Topics  . Smoking status: Former Smoker    Packs/day: 0.10    Types: Cigarettes    Quit date: 01/17/1973  . Smokeless tobacco: Never Used  . Alcohol use No  . Drug use: No  . Sexual activity: Not Currently   Other Topics Concern  . None   Social History Narrative   [facility] (03/27/16):   Originally from [facility]. Lives with her daughter in a townhouse. Patient did get lost while driving around this winter and is no longer allowed to drive. No pets currently. No bird or mold exposure.       Objective:   Physical Exam BP 116/70 (BP Location: Left Arm, Patient Position: Sitting, Cuff Size: Normal)   Pulse 74   Ht  (1.626 m)   Wt 127 lb (57.6 kg)   LMP  (LMP Unknown)   SpO2 98%   BMI 21.80 kg/m   General:  Awake. Alert. Accompanied by daughter today.  Integument:  Warm & dry. No rash on exposed skin. No bruising on exposed  skin. Extremities:  No cyanosis or clubbing.  HEENT:  Moist mucus membranes. Moderate bilateral nasal turbinate swelling with pale mucosa. No oral ulcers.  Cardiovascular:  Regular rate. No edema. No appreciable JVD appreciated.  Pulmonary:  Minimal basilar crackles. Normal work of breathing on room air . Abdomen: Soft. Normal bowel sounds. Nondistended.  Musculoskeletal:  Normal bulk and tone. No joint deformity or effusion appreciated.  PFT 10/04/16: FVC 2.22 L (92%) FEV1 1.77 L (95%) FEV1/FVC 0.80 FEF 25-75 1.68 L (96%) 04/22/16: FVC 2.21 L (91%) FEV1 1.87 L (99%) FEV1/FVC 0.84 FEF 25-75 1.19 L (110%)  DLCO corrected 33% 02/23/16: FVC 2.19 L (95%) FEV1 1.64 L (91%) FEV1/FVC 0.75 FEF 25-75 1.43 L (84%) negative bronchodilator response TLC 4.86 L (99%) RV 138% ERV 375% DLCO uncorrected 38%  10/04/16:  Walked 213 meters / Baseline Sat 100% on RA / Nadir Sat 100% on RA 04/22/16:  Walked 429 meters / Baseline Sat 97% on RA / Nadir Sat 96% on RA 03/27/16:  Walked 3 laps (at a fast pace) / Baseline Sat 98% on Room Air / Nadir Sat 94% on Room Air   IMAGING HRCT CHEST W/O 04/15/16 (personally reviewed by me):  Lower lobe predominant fibrotic changes that appear more central in nature and without significant subpleural reticulation. There are areas of traction bronchiectasis but no honeycombing. Minimal associated groundglass. No pleural effusion or thickening. Borderline precarinal lymph node noted. No pericardial effusion.  CARDIAC TTE (02/16/16):  LV normal in size with mild concentric hypertrophy. EF 60-65% with normal regional wall motion. Grade 2 diastolic dysfunction. LA mildly dilated & RA normal in size. RV mildly dilated with preserved systolic function. Mild aortic regurgitation without stenosis. Aortic root normal in size. Bioprosthetic mitral valve with mild stenosis and no  regurgitation noted. Moderate pulmonic regurgitation without stenosis. Mild tricuspid regurgitation. No pericardial effusion.  RIGHT HEART CATHETERIZATION (12/16/14): RA mean: 1 mmHg RV: 35/1 element of mercury Pulmonary artery: 34/12 mmHg Pulmonary artery mean: 21 mmHg Pulmonary capillary wedge pressure mean: 9 mmHg Pulmonary artery saturation: 61% Aortic saturation: 95% Cardiac output: 4.43 Cardiac index: 2.59 Pulmonary vascular resistance: 2.7 wood units   LEFT HEART CATHETERIZATION (07/24/14): Left ventricular end-diastolic pressure: 13 mmHg LV: 99/9 Left main: Angiographically normal Left anterior descending: Minimal luminal irregularities Left circumflex: Angiographically normal Right coronary artery: Dominant. Angiographically normal. Aortic valve: No stenosis Mitral valve: Mechanical valve prosthesis with severe mitral valve stenosis.   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 Patient's spirometry today remains stable which is encouraging. Her oxygenation during her walk test is also stable but her walk test distance has significantly decreased. This is likely due to the fact that a staff member was walking with her and she adjusted pace. She does have some seasonal congestion with allergies and we discussed using over-the-counter medications to help provide some relief. I instructed the patient and her daughter to contact my office if she had any new breathing problems or questions before her next appointment.  1. Amiodarone lung toxicity: Remains off amiodarone. No further testing at this time. 2. Seasonal allergic rhinitis: I recommended over-the-counter antihistamine  therapy. 3. Follow-up: Return to clinic in 1 year or sooner if needed.  Donna Christen Jamison Neighbor, M.D. [facility] Pager:  [phone] After 3pm or if no response, call [phone] 4:54 PM 10/04/16

## 2016-10-03 NOTE — Telephone Encounter (Signed)
Spoke with pt's daughter, advised reason for office visit and walk/pft.  Pt's daughter states she will keep appts for tomorrow.  Nothing further needed at this time.

## 2016-10-04 ENCOUNTER — Ambulatory Visit (INDEPENDENT_AMBULATORY_CARE_PROVIDER_SITE_OTHER): Payer: Medicare Other | Admitting: *Deleted

## 2016-10-04 ENCOUNTER — Encounter: Payer: Self-pay | Admitting: Pulmonary Disease

## 2016-10-04 ENCOUNTER — Ambulatory Visit (INDEPENDENT_AMBULATORY_CARE_PROVIDER_SITE_OTHER): Payer: Medicare Other | Admitting: Pulmonary Disease

## 2016-10-04 VITALS — BP 116/70 | HR 74 | Ht 64.0 in | Wt 127.0 lb

## 2016-10-04 DIAGNOSIS — J984 Other disorders of lung: Secondary | ICD-10-CM | POA: Diagnosis not present

## 2016-10-04 DIAGNOSIS — T462X5A Adverse effect of other antidysrhythmic drugs, initial encounter: Principal | ICD-10-CM

## 2016-10-04 DIAGNOSIS — J3089 Other allergic rhinitis: Secondary | ICD-10-CM | POA: Diagnosis not present

## 2016-10-04 DIAGNOSIS — J302 Other seasonal allergic rhinitis: Secondary | ICD-10-CM | POA: Insufficient documentation

## 2016-10-04 DIAGNOSIS — I63411 Cerebral infarction due to embolism of right middle cerebral artery: Secondary | ICD-10-CM | POA: Diagnosis not present

## 2016-10-04 DIAGNOSIS — J8410 Pulmonary fibrosis, unspecified: Secondary | ICD-10-CM

## 2016-10-04 LAB — PULMONARY FUNCTION TEST
FEF 25-75 Pre: 1.68 L/sec
FEF2575-%PRED-PRE: 96 %
FEV1-%PRED-PRE: 95 %
FEV1-Pre: 1.77 L
FEV1FVC-%PRED-PRE: 102 %
FEV6-%PRED-PRE: 96 %
FEV6-PRE: 2.22 L
FEV6FVC-%Pred-Pre: 104 %
FVC-%Pred-Pre: 92 %
FVC-PRE: 2.22 L
PRE FEV6/FVC RATIO: 100 %
Pre FEV1/FVC ratio: 80 %

## 2016-10-04 NOTE — Patient Instructions (Signed)
   You can try using over-the-counter Zyrtec, Allegra, or Claritin to help with your allergies.  We will see you back in a year or sooner if needed.  Please call us if you have any new breathing problems or questions before your next appointment.

## 2016-10-04 NOTE — Progress Notes (Signed)
SIX MIN WALK 10/04/2016 04/22/2016 03/27/2016  Medications pt unsure what medication she has taken pt states she unsure what medications she has taken this morning, due to her daughter placing all meds in a pill box. -  Supplimental Oxygen during Test? (L/min) No No No  Laps 4 8 -  Partial Lap (in Meters) 21 45 -  Baseline BP (sitting) 120/62 118/60 -  Baseline Heartrate 57 50 -  Baseline Dyspnea (Borg Scale) 0 0 -  Baseline Fatigue (Borg Scale) 0 0 -  Baseline SPO2 100 97 -  BP (sitting) 126/74 124/62 -  Heartrate 84 73 -  Dyspnea (Borg Scale) 0 2 -  Fatigue (Borg Scale) 0 0 -  SPO2 100 96 -  BP (sitting) 122/68 122/60 -  Heartrate 60 64 -  SPO2 100 98 -  Stopped or Paused before Six Minutes No No -  Distance Completed 213 429 -  Tech Comments: test performed with forehead probe. pt completed test at slow pace with no desats or complaints  no desats. pt walked at moderate pace with no complaints.  Pt walked at fast pace with steady gait

## 2016-10-04 NOTE — Progress Notes (Signed)
PFT done today. 

## 2016-10-11 ENCOUNTER — Telehealth: Payer: Self-pay | Admitting: Internal Medicine

## 2016-10-11 NOTE — Telephone Encounter (Signed)
Can you please schedule patient for an appointment. Thanks

## 2016-10-11 NOTE — Telephone Encounter (Signed)
Daughter called in and wants to know what the next step is or if pt needs to be referred out to some but they believe she is having early signs of alzheimer.  Daughter was upset and just at a loss as to what to do next Best number [phone]

## 2016-10-11 NOTE — Telephone Encounter (Signed)
Okay to schedule for a visit

## 2016-10-11 NOTE — Telephone Encounter (Signed)
Would you like for them to make an office visit

## 2016-10-14 ENCOUNTER — Ambulatory Visit (INDEPENDENT_AMBULATORY_CARE_PROVIDER_SITE_OTHER): Payer: Medicare Other | Admitting: Pharmacist Clinician (PhC)/ Clinical Pharmacy Specialist

## 2016-10-14 DIAGNOSIS — Z953 Presence of xenogenic heart valve: Secondary | ICD-10-CM

## 2016-10-14 DIAGNOSIS — Z7901 Long term (current) use of anticoagulants: Secondary | ICD-10-CM

## 2016-10-14 DIAGNOSIS — I4891 Unspecified atrial fibrillation: Secondary | ICD-10-CM | POA: Diagnosis not present

## 2016-10-14 DIAGNOSIS — Z952 Presence of prosthetic heart valve: Secondary | ICD-10-CM

## 2016-10-14 LAB — POCT INR: INR: 1.9

## 2016-10-14 NOTE — Telephone Encounter (Signed)
Patient has an appointment for tomorrow 10.9.18  After explaining why she needed an OV with her PCP for a referral, she understood.

## 2016-10-15 ENCOUNTER — Ambulatory Visit (INDEPENDENT_AMBULATORY_CARE_PROVIDER_SITE_OTHER): Payer: Medicare Other | Admitting: Internal Medicine

## 2016-10-15 ENCOUNTER — Encounter: Payer: Self-pay | Admitting: Internal Medicine

## 2016-10-15 VITALS — BP 108/70 | HR 57 | Temp 98.9°F | Ht 64.0 in | Wt 123.0 lb

## 2016-10-15 DIAGNOSIS — Z23 Encounter for immunization: Secondary | ICD-10-CM

## 2016-10-15 DIAGNOSIS — I63411 Cerebral infarction due to embolism of right middle cerebral artery: Secondary | ICD-10-CM

## 2016-10-15 DIAGNOSIS — F0150 Vascular dementia without behavioral disturbance: Secondary | ICD-10-CM

## 2016-10-15 MED ORDER — DONEPEZIL HCL 10 MG PO TABS: 10.0000 mg | ORAL_TABLET | Freq: Every day | ORAL | 3 refills | Status: DC

## 2016-10-15 NOTE — Assessment & Plan Note (Signed)
Rx for aricept and referral to neurology. She denies depression but there certainly could be some adjustment from deficits from stroke. Talked to them again about vascular dementia as more likely cause than alzheimer's for her dementia.

## 2016-10-15 NOTE — Progress Notes (Signed)
   Subjective:    Patient ID: [name], female    DOB: 02/21/1945, 71 y.o.   MRN: [id number]  HPI The patient is a 71 YO female coming in for memory concerns. She has had memory changes since her stroke earlier this year. She has had severe decline after stroke which affected her ability to change from short term memory to long term memory. Her daughter is with her today and is concerned that her memory is declining since that time. She is not managing her finances since the stroke. She is managing her ADLs. She does not drive. She denies problems with preparing her own meals. She denies depression symptoms or sleeping problems.   Review of Systems  Constitutional: Negative.   HENT: Negative.   Eyes: Negative.   Respiratory: Negative for cough, chest tightness and shortness of breath.   Cardiovascular: Negative for chest pain, palpitations and leg swelling.  Gastrointestinal: Negative for abdominal distention, abdominal pain, constipation, diarrhea, nausea and vomiting.  Musculoskeletal: Negative.   Skin: Negative.   Neurological: Positive for numbness.       Memory changes  Psychiatric/Behavioral: Negative.       Objective:   Physical Exam  Constitutional: She is oriented to person, place, and time. She appears well-developed and well-nourished.  HENT:  Head: Normocephalic and atraumatic.  Eyes: EOM are normal.  Neck: Normal range of motion.  Cardiovascular: Normal rate and regular rhythm.   Pulmonary/Chest: Effort normal and breath sounds normal. No respiratory distress. She has no wheezes. She has no rales.  Abdominal: Soft. Bowel sounds are normal. She exhibits no distension. There is no tenderness. There is no rebound.  Musculoskeletal: She exhibits no edema.  Neurological: She is alert and oriented to person, place, and time. Coordination normal.  Skin: Skin is warm and dry.  Psychiatric: She has a normal mood and affect.   Vitals:   10/15/16 1601  BP: 108/70   Pulse: (!) 57  Temp: 98.9 F (37.2 C)  TempSrc: Oral  SpO2: 100%  Weight: 123 lb (55.8 kg)  Height: 5\' 4"  (1.626 m)      Assessment & Plan:  Flu shot given at visit.

## 2016-10-15 NOTE — Patient Instructions (Signed)
We have sent in the memory medicine called aricept to take 1 pill daily.  We will get you in with the neurologist.   Memory Compensation Strategies  1. Use "WARM" strategy.  W= write it down  A= associate it  R= repeat it  M= make a mental note  2.   You can keep a Glass blower/designer.  Use a 3-ring notebook with sections for the following: calendar, important names and phone numbers,  medications, doctors' names/phone numbers, lists/reminders, and a section to journal what you did  each day.   3.    Use a calendar to write appointments down.  4.    Write yourself a schedule for the day.  This can be placed on the calendar or in a separate section of the Memory Notebook.  Keeping a  regular schedule can help memory.  5.    Use medication organizer with sections for each day or morning/evening pills.  You may need help loading it  6.    Keep a basket, or pegboard by the door.  Place items that you need to take out with you in the basket or on the pegboard.  You may also want to  include a message board for reminders.  7.    Use sticky notes.  Place sticky notes with reminders in a place where the task is performed.  For example: " turn off the  stove" placed by the stove, "lock the door" placed on the door at eye level, " take your medications" on  the bathroom mirror or by the place where you normally take your medications.  8.    Use alarms/timers.  Use while cooking to remind yourself to check on food or as a reminder to take your medicine, or as a  reminder to make a call, or as a reminder to perform another task, etc.

## 2016-10-16 ENCOUNTER — Encounter: Payer: Self-pay | Admitting: Neurology

## 2016-10-28 ENCOUNTER — Ambulatory Visit (INDEPENDENT_AMBULATORY_CARE_PROVIDER_SITE_OTHER): Payer: Medicare Other | Admitting: Pharmacist

## 2016-10-28 DIAGNOSIS — Z953 Presence of xenogenic heart valve: Secondary | ICD-10-CM

## 2016-10-28 DIAGNOSIS — Z7901 Long term (current) use of anticoagulants: Secondary | ICD-10-CM | POA: Diagnosis not present

## 2016-10-28 DIAGNOSIS — Z952 Presence of prosthetic heart valve: Secondary | ICD-10-CM

## 2016-10-28 LAB — POCT INR: INR: 2.5

## 2016-11-06 ENCOUNTER — Telehealth: Payer: Self-pay | Admitting: Internal Medicine

## 2016-11-06 MED ORDER — CLINDAMYCIN HCL 300 MG PO CAPS: 600.0000 mg | ORAL_CAPSULE | ORAL | 1 refills | Status: DC

## 2016-11-06 NOTE — Telephone Encounter (Signed)
New message    Pt is calling asking for a call back this morning. She has a dentist appt this afternoon at 2 and needs to know about antibiotics.

## 2016-11-06 NOTE — Telephone Encounter (Signed)
Rx sent to [facility] as requested, daughter aware phoned in

## 2016-11-11 ENCOUNTER — Telehealth: Payer: Self-pay | Admitting: Internal Medicine

## 2016-11-11 ENCOUNTER — Other Ambulatory Visit: Payer: Self-pay | Admitting: *Deleted

## 2016-11-11 ENCOUNTER — Ambulatory Visit (INDEPENDENT_AMBULATORY_CARE_PROVIDER_SITE_OTHER): Payer: Medicare Other | Admitting: Pharmacist Clinician (PhC)/ Clinical Pharmacy Specialist

## 2016-11-11 DIAGNOSIS — Z953 Presence of xenogenic heart valve: Secondary | ICD-10-CM | POA: Diagnosis not present

## 2016-11-11 DIAGNOSIS — Z7901 Long term (current) use of anticoagulants: Secondary | ICD-10-CM

## 2016-11-11 DIAGNOSIS — I4891 Unspecified atrial fibrillation: Secondary | ICD-10-CM | POA: Diagnosis not present

## 2016-11-11 DIAGNOSIS — Z952 Presence of prosthetic heart valve: Secondary | ICD-10-CM

## 2016-11-11 MED ORDER — ATORVASTATIN CALCIUM 40 MG PO TABS: 40.0000 mg | ORAL_TABLET | Freq: Every day | ORAL | 1 refills | Status: DC

## 2016-11-11 NOTE — Telephone Encounter (Signed)
°*  STAT* If patient is at the pharmacy, call can be transferred to refill team.   1. Which medications need to be refilled? (please list name of each medication and dose if known) Atorvastatin 40 mg  2. Which pharmacy/location (including street and city if local pharmacy) is medication to be sent to? [facility]  3. Do they need a 30 day or 90 day supply? 90

## 2016-11-12 LAB — PROTIME-INR
INR: 3 — ABNORMAL HIGH (ref 0.8–1.2)
Prothrombin Time: 29.7 s — ABNORMAL HIGH (ref 9.1–12.0)

## 2016-11-21 ENCOUNTER — Ambulatory Visit (INDEPENDENT_AMBULATORY_CARE_PROVIDER_SITE_OTHER): Payer: Medicare Other | Admitting: Pharmacist

## 2016-11-21 DIAGNOSIS — Z7901 Long term (current) use of anticoagulants: Secondary | ICD-10-CM | POA: Diagnosis not present

## 2016-11-21 DIAGNOSIS — Z953 Presence of xenogenic heart valve: Secondary | ICD-10-CM | POA: Diagnosis not present

## 2016-11-21 DIAGNOSIS — Z952 Presence of prosthetic heart valve: Secondary | ICD-10-CM

## 2016-11-21 LAB — POCT INR: INR: 2.8

## 2016-12-05 ENCOUNTER — Ambulatory Visit (INDEPENDENT_AMBULATORY_CARE_PROVIDER_SITE_OTHER): Payer: Medicare Other | Admitting: Pharmacist

## 2016-12-05 DIAGNOSIS — Z7901 Long term (current) use of anticoagulants: Secondary | ICD-10-CM

## 2016-12-05 DIAGNOSIS — Z953 Presence of xenogenic heart valve: Secondary | ICD-10-CM | POA: Diagnosis not present

## 2016-12-05 DIAGNOSIS — Z952 Presence of prosthetic heart valve: Secondary | ICD-10-CM

## 2016-12-05 LAB — POCT INR: INR: 4.6

## 2016-12-09 ENCOUNTER — Other Ambulatory Visit: Payer: Self-pay | Admitting: Internal Medicine

## 2016-12-09 NOTE — Telephone Encounter (Signed)
NEw MEssage   *STAT* If patient is at the pharmacy, call can be transferred to refill team.   1. Which medications need to be refilled? (please list name of each medication and dose if known) hydralazine 50mg    2. Which pharmacy/location (including street and city if local pharmacy) is medication to be sent to? [facility]  3. Do they need a 30 day or 90 day supply? 90  Per pt daughter pt will take her last dose today. Please call back to discuss

## 2016-12-09 NOTE — Telephone Encounter (Signed)
F/u message  Pt daughter call to f/u on med refill. She would like to know if it would be possible to get mediation refilled today before 1pm. She she may pick it up on here lunch break. Please call back to discuss

## 2016-12-12 ENCOUNTER — Ambulatory Visit (INDEPENDENT_AMBULATORY_CARE_PROVIDER_SITE_OTHER): Payer: Medicare Other | Admitting: Pharmacist

## 2016-12-12 DIAGNOSIS — Z953 Presence of xenogenic heart valve: Secondary | ICD-10-CM

## 2016-12-12 DIAGNOSIS — Z7901 Long term (current) use of anticoagulants: Secondary | ICD-10-CM

## 2016-12-12 DIAGNOSIS — Z952 Presence of prosthetic heart valve: Secondary | ICD-10-CM

## 2016-12-12 LAB — POCT INR: INR: 2.5

## 2016-12-14 IMAGING — CR DG CHEST 1V PORT
1 series · 1 of 1 positions shown · non-contrast
Comparison: January 13, 2007

CLINICAL DATA: Cardiac arrest.  Hypoxia.

EXAM:
PORTABLE CHEST - 1 VIEW

[ap portable]
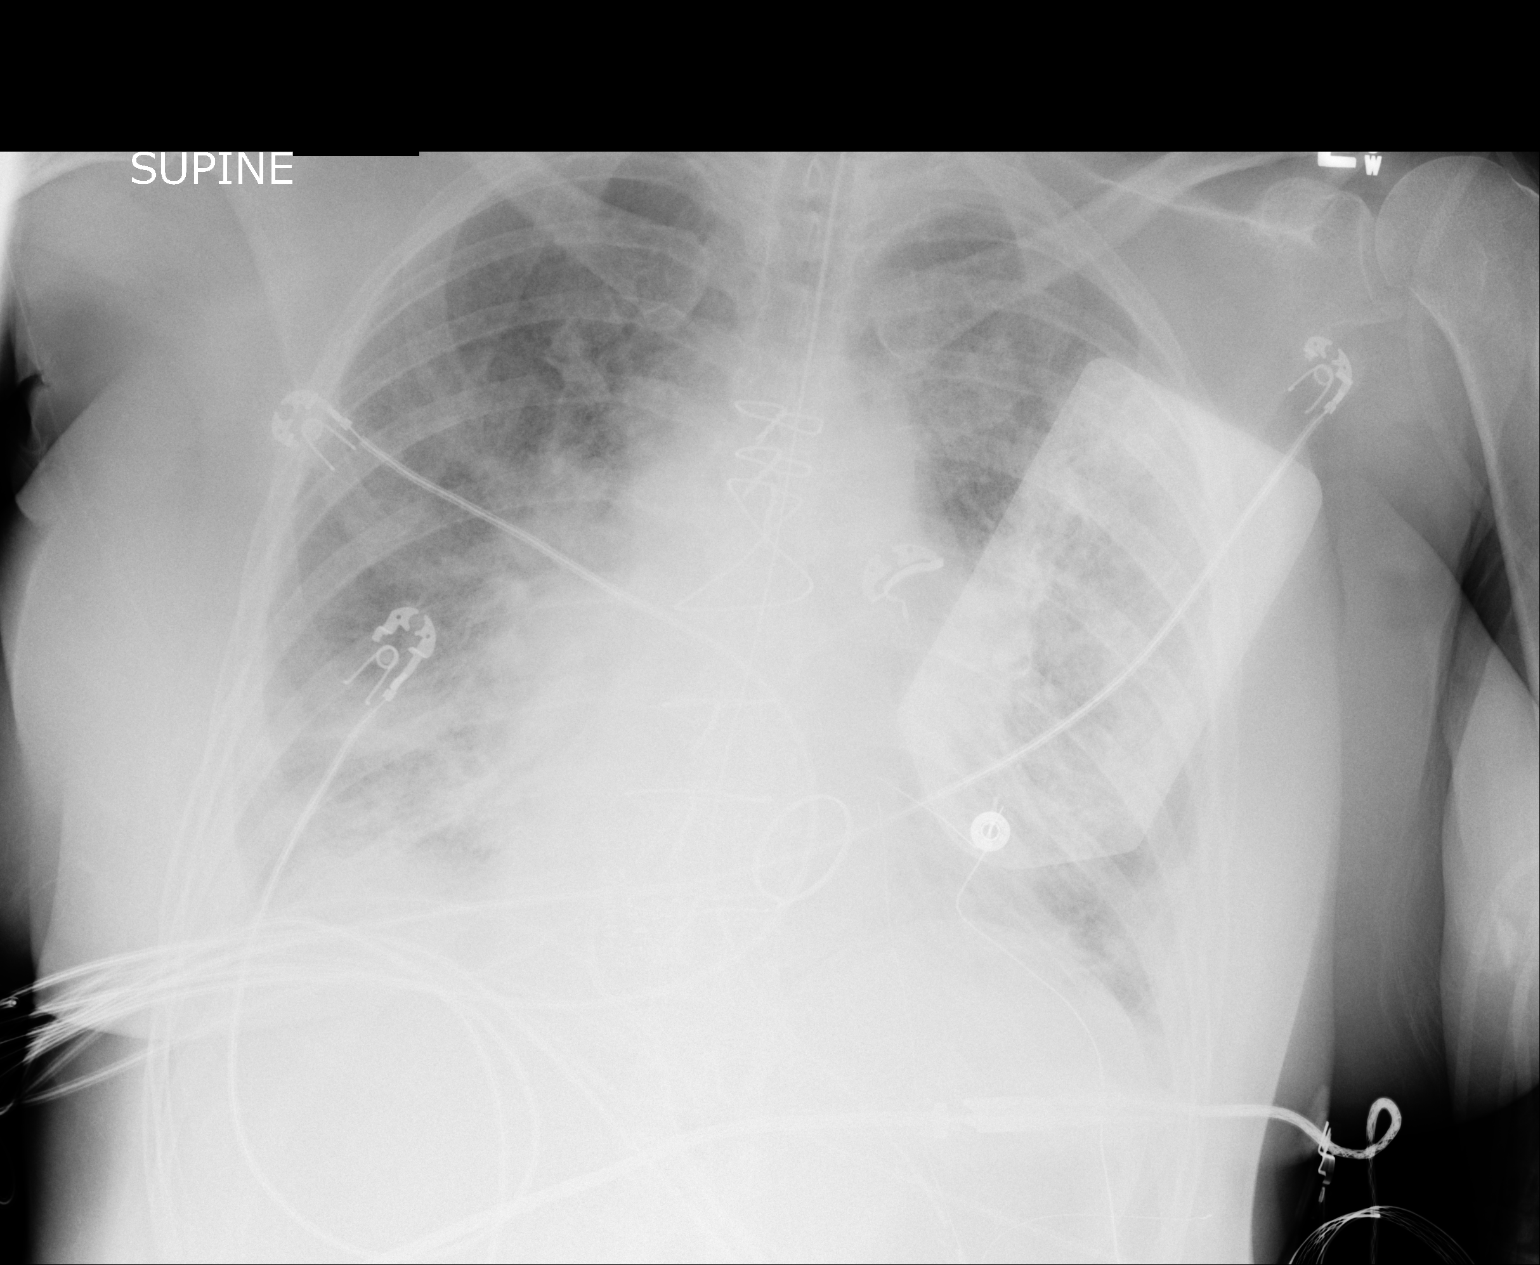

[1 of 1 positions shown; findings below may reference images not displayed]

FINDINGS: The endotracheal tube tip is at the level of T1, approximately 11 cm
above the carina. Nasogastric tube tip is in the stomach with the
side port at the gastroesophageal junction. No pneumothorax. There
is interstitial and patchy alveolar edema throughout the lungs
bilaterally, slightly more on the right than on the left. Heart is
enlarged with pulmonary venous hypertension. Patient is status post
mitral valve replacement.
IMPRESSION: Tube positions as described without pneumothorax. Note that both the
endotracheal tube and nasogastric tube may warrant advancing. There
is evidence of underlying congestive heart failure.

## 2016-12-14 IMAGING — CR DG CHEST 1V PORT
1 series · 1 of 1 positions shown · non-contrast
Comparison: 07/21/2014

CLINICAL DATA: Respiratory failure

EXAM:
PORTABLE CHEST - 1 VIEW

[AP]
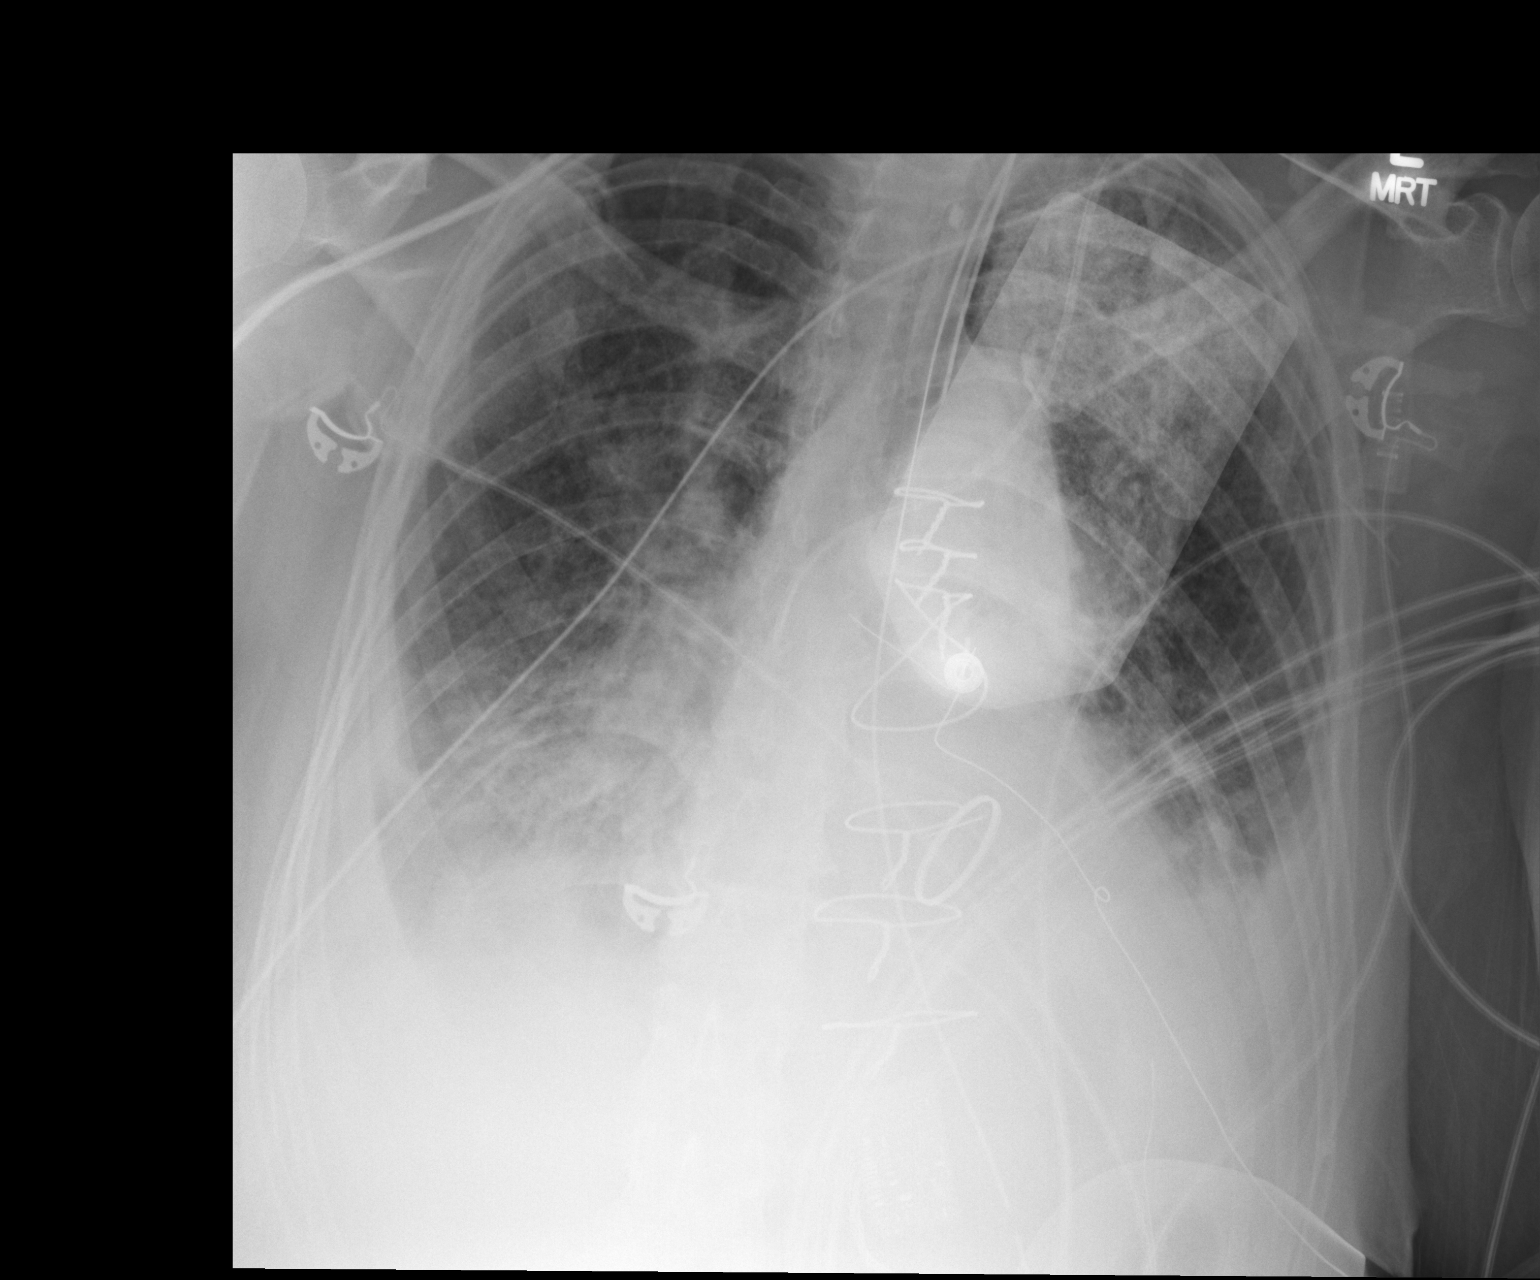

[1 of 1 positions shown; findings below may reference images not displayed]

FINDINGS: Cardiac shadow is stable. Postsurgical changes are again seen. An
endotracheal tube is again identified 3.5 cm above the carina. A
left jugular central line is noted in satisfactory position as is a
nasogastric catheter. Bilateral pleural effusions are seen increased
from the prior exam. Bibasilar infiltrative changes are noted as
well. Stable changes in the left apex are seen.
IMPRESSION: Stable patchy opacities are noted within the bases as well as the
left upper lobe. Increasing bilateral pleural effusions are seen.

Tubes and lines as described.

## 2016-12-24 ENCOUNTER — Telehealth: Payer: Self-pay | Admitting: Internal Medicine

## 2016-12-24 MED ORDER — ISOSORBIDE MONONITRATE ER 30 MG PO TB24: 30.0000 mg | ORAL_TABLET | Freq: Every day | ORAL | 3 refills | Status: DC

## 2016-12-24 NOTE — Telephone Encounter (Signed)
Patient has been made aware that the prescription has been filled, per her request.

## 2016-12-24 NOTE — Telephone Encounter (Signed)
°  New Prob   *STAT* If patient is at the pharmacy, call can be transferred to refill team.   1. Which medications need to be refilled? (please list name of each medication and dose if known): isosorbide mononitrate (IMDUR) 30 MG 24 hr tablet  2. Which pharmacy/location (including street and city if local pharmacy) is medication to be sent to? [facility]  3. Do they need a 30 day or 90 day supply? 90 days

## 2016-12-26 ENCOUNTER — Ambulatory Visit (INDEPENDENT_AMBULATORY_CARE_PROVIDER_SITE_OTHER): Payer: Medicare Other | Admitting: Pharmacist Clinician (PhC)/ Clinical Pharmacy Specialist

## 2016-12-26 DIAGNOSIS — I4891 Unspecified atrial fibrillation: Secondary | ICD-10-CM | POA: Diagnosis not present

## 2016-12-26 DIAGNOSIS — Z952 Presence of prosthetic heart valve: Secondary | ICD-10-CM

## 2016-12-26 DIAGNOSIS — Z7901 Long term (current) use of anticoagulants: Secondary | ICD-10-CM | POA: Diagnosis not present

## 2016-12-26 DIAGNOSIS — Z953 Presence of xenogenic heart valve: Secondary | ICD-10-CM

## 2016-12-26 LAB — POCT INR: INR: 2.4

## 2016-12-26 NOTE — Patient Instructions (Signed)
Description   Continue 1 tablet daily except for 1.5 every Monday, Wednesday and Friday. Repeat INR in 2 weeks

## 2017-01-06 ENCOUNTER — Ambulatory Visit: Payer: Medicare Other | Admitting: Neurology

## 2017-01-09 ENCOUNTER — Ambulatory Visit (INDEPENDENT_AMBULATORY_CARE_PROVIDER_SITE_OTHER): Payer: Medicare Other | Admitting: Pharmacist

## 2017-01-09 DIAGNOSIS — Z7901 Long term (current) use of anticoagulants: Secondary | ICD-10-CM

## 2017-01-09 DIAGNOSIS — Z953 Presence of xenogenic heart valve: Secondary | ICD-10-CM | POA: Diagnosis not present

## 2017-01-09 DIAGNOSIS — Z952 Presence of prosthetic heart valve: Secondary | ICD-10-CM

## 2017-01-09 LAB — POCT INR: INR: 2.4

## 2017-01-09 MED ORDER — WARFARIN SODIUM 2 MG PO TABS: ORAL_TABLET | ORAL | 5 refills | Status: DC

## 2017-01-21 ENCOUNTER — Telehealth: Payer: Self-pay | Admitting: Internal Medicine

## 2017-01-21 MED ORDER — FUROSEMIDE 20 MG PO TABS: 20.0000 mg | ORAL_TABLET | Freq: Every day | ORAL | 1 refills | Status: DC

## 2017-01-21 MED ORDER — PANTOPRAZOLE SODIUM 40 MG PO TBEC: 40.0000 mg | DELAYED_RELEASE_TABLET | Freq: Every day | ORAL | 1 refills | Status: DC

## 2017-01-21 NOTE — Telephone Encounter (Signed)
New Message    *STAT* If patient is at the pharmacy, call can be transferred to refill team.   1. Which medications need to be refilled? (please list name of each medication and dose if known) furosemide (LASIX) 20 MG tablet and pantoprazole (PROTONIX) 40 MG tablet 2. Which pharmacy/location (including street and city if local pharmacy) is medication to be sent to? [facility]   3. Do they need a 30 day or 90 day supply? 30

## 2017-01-23 ENCOUNTER — Ambulatory Visit (INDEPENDENT_AMBULATORY_CARE_PROVIDER_SITE_OTHER): Payer: Medicare Other | Admitting: Pharmacist

## 2017-01-23 DIAGNOSIS — Z7901 Long term (current) use of anticoagulants: Secondary | ICD-10-CM

## 2017-01-23 DIAGNOSIS — Z953 Presence of xenogenic heart valve: Secondary | ICD-10-CM

## 2017-01-23 DIAGNOSIS — Z952 Presence of prosthetic heart valve: Secondary | ICD-10-CM

## 2017-01-23 LAB — POCT INR: INR: 2.3

## 2017-02-06 ENCOUNTER — Ambulatory Visit (INDEPENDENT_AMBULATORY_CARE_PROVIDER_SITE_OTHER): Payer: Medicare Other | Admitting: Pharmacist

## 2017-02-06 DIAGNOSIS — Z7901 Long term (current) use of anticoagulants: Secondary | ICD-10-CM

## 2017-02-06 DIAGNOSIS — Z953 Presence of xenogenic heart valve: Secondary | ICD-10-CM

## 2017-02-06 DIAGNOSIS — Z952 Presence of prosthetic heart valve: Secondary | ICD-10-CM

## 2017-02-06 LAB — POCT INR: INR: 2.3

## 2017-02-11 ENCOUNTER — Telehealth: Payer: Self-pay | Admitting: Internal Medicine

## 2017-02-11 NOTE — Telephone Encounter (Signed)
Daughter aware of MD recommendations.

## 2017-02-11 NOTE — Telephone Encounter (Signed)
Returned call to daughter. Notified her that last weight from August 2018 was 126lbs. She states her mom (patient) is 117lbs. She states this is a gradual weight loss. She eats OK for the most part, no appetite issues. She does not complain of excessive fatigue or weakness.   Would like any recommendations from MD? Should they be concerned? OK to try ensure or boost? Need to come in sooner than April 2019?  Routed to MD to advise

## 2017-02-11 NOTE — Telephone Encounter (Signed)
No need to see me sooner. Ok to add Ensure or Boost!  DR. H

## 2017-02-11 NOTE — Telephone Encounter (Signed)
Daughter would like to know what pt's weight was her last office visit please.

## 2017-02-18 ENCOUNTER — Ambulatory Visit: Payer: Self-pay

## 2017-02-18 ENCOUNTER — Encounter: Payer: Self-pay | Admitting: Internal Medicine

## 2017-02-18 ENCOUNTER — Ambulatory Visit (INDEPENDENT_AMBULATORY_CARE_PROVIDER_SITE_OTHER): Payer: Medicare Other | Admitting: Internal Medicine

## 2017-02-18 DIAGNOSIS — M1A0310 Idiopathic chronic gout, right wrist, without tophus (tophi): Secondary | ICD-10-CM | POA: Diagnosis not present

## 2017-02-18 MED ORDER — PREDNISONE 20 MG PO TABS: 40.0000 mg | ORAL_TABLET | Freq: Every day | ORAL | 0 refills | Status: DC

## 2017-02-18 NOTE — Progress Notes (Signed)
   Subjective:    Patient ID: [name], female    DOB: [birth year], 72 y.o.   MRN: [id number]  HPI The patient is a 72 YO female coming in for right hand pain. She is not able to help with history due to vascular dementia and her daughter helps with history. Stopped using it yesterday and it is red and swollen. History of gout in her foot. Denies fall or injury but is home alone during the day. Denies numbness.   Review of Systems  Constitutional: Positive for activity change. Negative for appetite change, chills, fatigue, fever and unexpected weight change.  Respiratory: Negative.   Cardiovascular: Negative.   Gastrointestinal: Negative.   Musculoskeletal: Positive for arthralgias, joint swelling and myalgias. Negative for back pain and gait problem.  Skin: Negative.   Neurological: Negative.       Objective:   Physical Exam  Constitutional: She is oriented to person, place, and time. She appears well-developed and well-nourished.  HENT:  Head: Normocephalic and atraumatic.  Eyes: EOM are normal.  Neck: Normal range of motion.  Cardiovascular: Normal rate and regular rhythm.  Pulmonary/Chest: Effort normal and breath sounds normal. No respiratory distress. She has no wheezes. She has no rales.  Abdominal: Soft.  Musculoskeletal: She exhibits edema and tenderness.  Unwilling to use pincer grip with thumb on exam, pain over the wrist and mcp joint right hand, some swelling and redness over the wrist.   Neurological: She is alert and oriented to person, place, and time. Coordination normal.  Skin: Skin is warm and dry.   Vitals:   02/18/17 1434  BP: 92/60  Pulse: 64  Temp: 99.6 F (37.6 C)  TempSrc: Oral  SpO2: 99%  Weight: 118 lb (53.5 kg)  Height: 5\' 4"  (1.626 m)      Assessment & Plan:

## 2017-02-18 NOTE — Telephone Encounter (Signed)
   Answer Assessment - Initial Assessment Questions 1. REASON FOR CALL or QUESTION: "What is your reason for calling today?" or "How can I best help you?" or "What question do you have that I can help answer?"     Pt's daughter calling to see if there is a drug interaction between prednisone and coumadin. Daughter said that pharmacist mentioned an interaction but did not say what it was. NT looked up meds on Micromedex and it mentioned that the interaction could be a increased risk of bleeding or diminished effect of warfarin  Per chart pt was on prednisone 08/03/16 and per daughter there were no side effect. Daughter stated she will start her mother on the med and go to schedule blood draw Thursday.  Protocols used: INFORMATION ONLY CALL-A-AH

## 2017-02-18 NOTE — Patient Instructions (Signed)
We have sent in prednisone to help the gout. Take 2 pills daily for 5 days.   Low-Purine Diet Purines are compounds that affect the level of uric acid in your body. A low-purine diet is a diet that is low in purines. Eating a low-purine diet can prevent the level of uric acid in your body from getting too high and causing gout or kidney stones or both. What do I need to know about this diet?  Choose low-purine foods. Examples of low-purine foods are listed in the next section.  Drink plenty of fluids, especially water. Fluids can help remove uric acid from your body. Try to drink 8-16 cups (1.9-3.8 L) a day.  Limit foods high in fat, especially saturated fat, as fat makes it harder for the body to get rid of uric acid. Foods high in saturated fat include pizza, cheese, ice cream, whole milk, fried foods, and gravies. Choose foods that are lower in fat and lean sources of protein. Use olive oil when cooking as it contains healthy fats that are not high in saturated fat.  Limit alcohol. Alcohol interferes with the elimination of uric acid from your body. If you are having a gout attack, avoid all alcohol.  Keep in mind that different people's bodies react differently to different foods. You will probably learn over time which foods do or do not affect you. If you discover that a food tends to cause your gout to flare up, avoid eating that food. You can more freely enjoy foods that do not cause problems. If you have any questions about a food item, talk to your dietitian or health care provider. Which foods are low, moderate, and high in purines? The following is a list of foods that are low, moderate, and high in purines. You can eat any amount of the foods that are low in purines. You may be able to have small amounts of foods that are moderate in purines. Ask your health care provider how much of a food moderate in purines you can have. Avoid foods high in purines. Grains  Foods low in purines:  Enriched white bread, pasta, rice, cake, cornbread, popcorn.  Foods moderate in purines: Whole-grain breads and cereals, wheat germ, bran, oatmeal. Uncooked oatmeal. Dry wheat bran or wheat germ.  Foods high in purines: Pancakes, [place] toast, biscuits, muffins. Vegetables  Foods low in purines: All vegetables, except those that are moderate in purines.  Foods moderate in purines: Asparagus, cauliflower, spinach, mushrooms, green peas. Fruits  All fruits are low in purines. Meats and other Protein Foods  Foods low in purines: Eggs, nuts, peanut butter.  Foods moderate in purines: 80-90% lean beef, lamb, veal, pork, poultry, fish, eggs, peanut butter, nuts. Crab, lobster, oysters, and shrimp. Cooked dried beans, peas, and lentils.  Foods high in purines: Anchovies, sardines, herring, mussels, tuna, codfish, scallops, trout, and haddock. [name]. Organ meats (such as liver or kidney). Tripe. Game meat. Goose. Sweetbreads. Dairy  All dairy foods are low in purines. Low-fat and fat-free dairy products are best because they are low in saturated fat. Beverages  Drinks low in purines: Water, carbonated beverages, tea, coffee, cocoa.  Drinks moderate in purines: Soft drinks and other drinks sweetened with high-fructose corn syrup. Juices. To find whether a food or drink is sweetened with high-fructose corn syrup, look at the ingredients list.  Drinks high in purines: Alcoholic beverages (such as beer). Condiments  Foods low in purines: Salt, herbs, olives, pickles, relishes, vinegar.  Foods moderate  in purines: Butter, margarine, oils, mayonnaise. Fats and Oils  Foods low in purines: All types, except gravies and sauces made with meat.  Foods high in purines: Gravies and sauces made with meat. Other Foods  Foods low in purines: Sugars, sweets, gelatin. Cake. Soups made without meat.  Foods moderate in purines: Meat-based or fish-based soups, broths, or bouillons. Foods and drinks  sweetened with high-fructose corn syrup.  Foods high in purines: High-fat desserts (such as ice cream, cookies, cakes, pies, doughnuts, and chocolate). Contact your dietitian for more information on foods that are not listed here. This information is not intended to replace advice given to you by your health care provider. Make sure you discuss any questions you have with your health care provider. Document Released: 04/20/2010 Document Revised: 06/01/2015 Document Reviewed: 11/30/2012 Elsevier Interactive Patient Education  2017 [employer].

## 2017-02-18 NOTE — Assessment & Plan Note (Signed)
Moderate flare today, rx for prednisone burst. Can use tylenol for pain. If no improvement needs x-ray since she cannot help with history could be unknown injury and fracture.

## 2017-02-19 NOTE — Telephone Encounter (Signed)
Pt's daughter is just wanting to make sure it is okay to take the prednisone with the coumadin, because she was told that there may be an interaction between the two medications. She had taken prednisone back in July with no side effects.

## 2017-02-19 NOTE — Telephone Encounter (Signed)
Yes, it is okay to take

## 2017-02-19 NOTE — Telephone Encounter (Signed)
Patients daughter informed of MD response  

## 2017-02-20 ENCOUNTER — Ambulatory Visit (INDEPENDENT_AMBULATORY_CARE_PROVIDER_SITE_OTHER): Payer: Medicare Other | Admitting: Pharmacist Clinician (PhC)/ Clinical Pharmacy Specialist

## 2017-02-20 DIAGNOSIS — Z953 Presence of xenogenic heart valve: Secondary | ICD-10-CM

## 2017-02-20 DIAGNOSIS — Z952 Presence of prosthetic heart valve: Secondary | ICD-10-CM

## 2017-02-20 DIAGNOSIS — I4891 Unspecified atrial fibrillation: Secondary | ICD-10-CM | POA: Diagnosis not present

## 2017-02-20 DIAGNOSIS — Z7901 Long term (current) use of anticoagulants: Secondary | ICD-10-CM

## 2017-02-20 LAB — POCT INR: INR: 2.2

## 2017-02-21 ENCOUNTER — Encounter: Payer: Self-pay | Admitting: Internal Medicine

## 2017-03-05 ENCOUNTER — Telehealth: Payer: Self-pay | Admitting: Internal Medicine

## 2017-03-05 NOTE — Telephone Encounter (Signed)
Copied from CRM [id number]. Topic: Quick Communication - See Telephone Encounter >> Mar 05, 2017  7:28 AM Waymon Amato wrote: CRM for notification. See Telephone encounter for:  Pt daughter called stating that the patient hand is swelling again and she feels its related to the gout since she just had an episode two weeks ago and has been eating hamburger, chips -the daugheter is trying to put weight on the patient and she is hoping that the medication for gout that was given can be called in again instead of patient coming in  Pt daughter was not with her mother to know the name of the medication Best [phone] 03/05/17.

## 2017-03-05 NOTE — Telephone Encounter (Signed)
Called daughter back and states that patients hand got better after her round of prednisone, but she was giving mother gout inducing foods trying to bring up patients weight not realizing she was making it worse. Daughter made an appointment for tomorrow but is wondering if there is anyway that medication could be sent in for the gout so they did not have to come to the appointment because the daughters child has the flu and she was not wanting to bring her out of the house, but does not want her mother to be in pain. Please advise.  Daughter did not want to have message routed to anyone else besides Dr. Okey Dupre.

## 2017-03-05 NOTE — Telephone Encounter (Signed)
Copied from CRM [id number]. Topic: Quick Communication - See Telephone Encounter >> Mar 05, 2017  7:28 AM Waymon Amato wrote: CRM for notification. See Telephone encounter for:  Pt daughter called stating that the patient hand is swelling again and she feels its related to the gout since she just had an episode two weeks ago and has been eating hamburger, chips -the daugheter is trying to put weight on the patient and she is hoping that the medication for gout that was given can be called in again instead of patient coming in  Pt daughter was not with her mother to know the name of the medication Best [phone] 03/05/17. >> Mar 05, 2017  1:22 PM Raquel Sarna wrote: Pt's daughter Jerrell Belfast - [phone]   Is checking back to see if an Rx can be called in for her mother.  Pt's daughter has been feeding her a lot of "gout" causing foods.  She states if an Rx can be called in she will give it to pt and feed her other foods and monitor pt.  Please call her back to let her know before end of business day today.

## 2017-03-05 NOTE — Telephone Encounter (Signed)
Patient was here on 02/18/17 for this issues. Would you like patient to come and be evaluated again? She has an appointment set up for 03/06/17. Please advise. Thank you.

## 2017-03-06 ENCOUNTER — Ambulatory Visit: Payer: Medicare Other | Admitting: Internal Medicine

## 2017-03-06 ENCOUNTER — Telehealth: Payer: Self-pay | Admitting: Internal Medicine

## 2017-03-06 MED ORDER — PREDNISONE 20 MG PO TABS
40.0000 mg | ORAL_TABLET | Freq: Every day | ORAL | 0 refills | Status: DC
Start: 2017-03-06 — End: 2017-03-20

## 2017-03-06 NOTE — Telephone Encounter (Signed)
Sent in refill of prednisone. Please call and have them cancel visit for today and make visit if pain not relieved.

## 2017-03-06 NOTE — Telephone Encounter (Signed)
P daughter made aware.

## 2017-03-06 NOTE — Telephone Encounter (Signed)
Patients daughter informed of MD response  

## 2017-03-06 NOTE — Telephone Encounter (Signed)
Disregard, duplicate message

## 2017-03-06 NOTE — Telephone Encounter (Signed)
The daughter, [name] called yesterday asking specifically for Dr. Okey Dupre to address her mother's bout with gout. She has called again this morning because she did not hear anything yesterday from Dr. Okey Dupre.    I let her know we were waiting for Dr. Frutoso Chase reply and that the message was sent yesterday at 3:19PM.  [name] has a daughter with the flu so is not sure she can bring her mother into the 10:30 appt this morning unless her daughter's fever has broken.    She hasn't checked her temperature yet.  I let [name] know I would route another high priority note to Dr. Frutoso Chase nurse pool making her aware of the situation.

## 2017-03-10 ENCOUNTER — Ambulatory Visit (INDEPENDENT_AMBULATORY_CARE_PROVIDER_SITE_OTHER): Payer: Medicare Other | Admitting: Pharmacist

## 2017-03-10 DIAGNOSIS — I4891 Unspecified atrial fibrillation: Secondary | ICD-10-CM

## 2017-03-10 DIAGNOSIS — Z7901 Long term (current) use of anticoagulants: Secondary | ICD-10-CM

## 2017-03-10 DIAGNOSIS — Z953 Presence of xenogenic heart valve: Secondary | ICD-10-CM

## 2017-03-10 DIAGNOSIS — Z952 Presence of prosthetic heart valve: Secondary | ICD-10-CM | POA: Diagnosis not present

## 2017-03-10 LAB — POCT INR: INR: 1.9

## 2017-03-10 NOTE — Patient Instructions (Signed)
Description   Today take an extra 1/2 tablet since already took 1.5 tablets then continue 1 tablet daily except for 1.5 every Monday, Wednesday and Friday. Repeat INR in 2 weeks

## 2017-03-18 ENCOUNTER — Other Ambulatory Visit: Payer: Self-pay | Admitting: Internal Medicine

## 2017-03-20 ENCOUNTER — Ambulatory Visit (INDEPENDENT_AMBULATORY_CARE_PROVIDER_SITE_OTHER): Payer: Medicare Other | Admitting: Neurology

## 2017-03-20 ENCOUNTER — Encounter: Payer: Self-pay | Admitting: Neurology

## 2017-03-20 ENCOUNTER — Other Ambulatory Visit: Payer: Self-pay

## 2017-03-20 VITALS — BP 106/74 | HR 76 | Ht 63.0 in | Wt 117.0 lb

## 2017-03-20 DIAGNOSIS — I679 Cerebrovascular disease, unspecified: Secondary | ICD-10-CM | POA: Diagnosis not present

## 2017-03-20 DIAGNOSIS — I63411 Cerebral infarction due to embolism of right middle cerebral artery: Secondary | ICD-10-CM | POA: Diagnosis not present

## 2017-03-20 DIAGNOSIS — F0150 Vascular dementia without behavioral disturbance: Secondary | ICD-10-CM

## 2017-03-20 DIAGNOSIS — I480 Paroxysmal atrial fibrillation: Secondary | ICD-10-CM | POA: Diagnosis not present

## 2017-03-20 NOTE — Progress Notes (Signed)
NEUROLOGY CONSULTATION NOTE  Helia Haese MRN: [id number] DOB: Oct 26, 1945  Referring provider: Dr. Hillard Danker Primary care provider: Dr. Hillard Danker  Reason for consult:  dementia  Dear Dr Okey Dupre:  Thank you for your kind referral of Marlisha E Purnell Shoemaker for consultation of the above symptoms. Although her history is well known to you, please allow me to reiterate it for the purpose of our medical record. The patient was accompanied to the clinic by her daughter who also provides collateral information. Records and images were personally reviewed where available.  HISTORY OF PRESENT ILLNESS: This is a 72 year old right-handed woman with a history of mitral valve replacement for bacterial endocarditis with thrombosis of valve s/p redo with bioprosthetic valve, atrial fibrillation on Coumadin, hypertension, hyperlipidemia, presenting for evaluation of dementia. She feels her memory is fine. Her daughter thinks memory issues became much more noticeable after her stroke in February 2017. She having increased forgetfulness since around 2015, then on 02/21/15 was admitted for increased confusion. I personally reviewed MRI brain which showed right basal ganglia and right caudate head infarcts, embolic pattern. MRA head showed severe posterior atherosclerosis with basilar artery occlusion and left vertebral artery and P2 occlusion. Carotid ultrasound showed left subclavian steal syndrome. TTE showed EF of 50-55%, improved from prior 25-30%. LDL 107, HbA1c 5.8. INR was 1.65 on admission, and was therapeutic on discharge. Due to severe posterior atherosclerosis, aspirin was added to Coumadin, and she was discharged on Lipitor as well. Her daughter reports that she had been living alone prior to the stroke, but moved in with her daughter after because she was having more difficulties living alone. She keeps asking when an appointment is even if it is written on a calendar.  Writing notes does not help. There are periods where she seems to get better, then "backs up." Her daughter bought her a pillbox with alarm because she is home alone when daughter works. Recently she has forgotten to eat, despite her daughter leaving notes about her lunch. Her daughter states she "wants to know what's going on." The memory changes have slowed down a little since the stroke in 2017. No personality changes, paranoia or hallucinations. Her daughter has managed finances for several years. She stopped driving after the stroke. She is independent with dressing and bathing. She is taking Donepezil 10mg  daily without side effects.   She states her right hand was weak, but better now. She denies any headaches, dizziness, diplopia, dysarthria/dysphagia, neck/back pain, focal numbness/tingling, bowel/bladder dysfunction, anosmia, tremors. Sleep is good. She reports her mood is "the same." No family history of dementia. She denies any history of significant head injuries, no alcohol use.   PAST MEDICAL HISTORY: Past Medical History:  Diagnosis Date  . Acute CHF (congestive heart failure) (HCC) 12/14/2014  . Hyperlipidemia   . Hypertension   . 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 S/P MVR (mitral valve replacement) 04/11/1998   #29 St. Jude bileaflet mechanical valve for bacterial endocarditis  . S/P redo mitral valve replacement with bioprosthetic valve 07/25/2014   27 mm [facility] Mitral bovine bioprosthetic tissue valve  . Stroke (HCC)   . 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 Valvular endocarditis 2000   Streptococcus    PAST SURGICAL HISTORY: Past Surgical History:  Procedure Laterality Date  . ABDOMINAL HYSTERECTOMY    . CARDIAC CATHETERIZATION N/A 07/24/2014  Procedure: Right/Left Heart Cath and Coronary Angiography;  Surgeon: Iran Ouch, MD;   Location: [facility] INVASIVE CV LAB;  Service: Cardiovascular;  Laterality: N/A;  . CARDIAC CATHETERIZATION N/A 07/22/2014   Procedure: Fluoroscopy Guidance;  Surgeon: Lennette Bihari, MD;  Location: [facility] INVASIVE CV LAB;  Service: Cardiovascular;  Laterality: N/A;  . CARDIAC CATHETERIZATION N/A 12/16/2014   Procedure: Right Heart Cath;  Surgeon: Laurey Morale, MD;  Location: [facility] INVASIVE CV LAB;  Service: Cardiovascular;  Laterality: N/A;  . CARDIAC VALVE REPLACEMENT    . MITRAL VALVE REPLACEMENT  04/11/1998   St. Jude mechanical MVR (severe MR, episode of streptococcal bacterial endocarditis - Dr. Cornelius Moras  . MITRAL VALVE REPLACEMENT N/A 07/25/2014   Procedure: REDO MITRAL VALVE REPLACEMENT (MVR);  Surgeon: Purcell Nails, MD;  Location: [facility] OR;  Service: Open Heart Surgery;  Laterality: N/A;  . TOTAL ABDOMINAL HYSTERECTOMY W/ BILATERAL SALPINGOOPHORECTOMY  12/14/1998  . TRANSTHORACIC ECHOCARDIOGRAM  11/2009   EF=>55%; bi-leaflet St. Jude mech prosthesis; mild TR, RVSP elevated at 40-73mmHg, mod pulm HTN; trace AV regurg; mild pulm valve regurg  . TUBAL LIGATION      MEDICATIONS: Current Outpatient Medications on File Prior to Visit  Medication Sig Dispense Refill  . aspirin EC 81 MG tablet Take 1 tablet (81 mg total) by mouth daily. 30 tablet 0  . atorvastatin (LIPITOR) 40 MG tablet Take 1 tablet (40 mg total) daily by mouth. 90 tablet 1  . carvedilol (COREG) 3.125 MG tablet TAKE 1 TABLET BY MOUTH 2 TIMES DAILY WITH A MEAL 60 tablet 1  . clindamycin (CLEOCIN) 300 MG capsule Take 2 capsules (600 mg total) by mouth as directed. Take one hour prior to dental work 2 capsule 1  . donepezil (ARICEPT) 10 MG tablet Take 1 tablet (10 mg total) by mouth at bedtime. 90 tablet 3  . furosemide (LASIX) 20 MG tablet TAKE 1 TABLET (20 MG TOTAL) BY MOUTH DAILY 30 tablet 6  . hydrALAZINE (APRESOLINE) 50 MG tablet TAKE 1 & 1/2 TABLETS BY MOUTH 3 TIMES DAILY 135 tablet 6  . isosorbide mononitrate (IMDUR) 30 MG 24 hr tablet  Take 1 tablet (30 mg total) by mouth daily. 90 tablet 3  . pantoprazole (PROTONIX) 40 MG tablet TAKE 1 TABLET (40 MG TOTAL) BY MOUTH DAILY 30 tablet 6  . warfarin (COUMADIN) 2 MG tablet Take 1 to 1.5 tablets by mouth daily or as directed by coumadin clinic 60 tablet 5   No current facility-administered medications on file prior to visit.     ALLERGIES: Allergies  Allergen Reactions  . Augmentin [Amoxicillin-Pot Clavulanate] Other (See Comments)    Unknown reaction Has patient had a PCN reaction causing immediate rash, facial/tongue/throat swelling, SOB or lightheadedness with hypotension Has patient had a PCN reaction causing severe rash involving mucus membranes or skin necrosis Has patient had a PCN reaction that required hospitalization  Has patient had a PCN reaction occurring within the last 10 years] If all of the above answers are "NO", then may proceed with Cephalosporin use.     FAMILY HISTORY: Family History  Problem Relation Age of Onset  . Lung cancer Mother   . Heart attack Mother   . Heart failure Maternal Grandmother   . Heart failure Maternal Grandfather   . Stroke Maternal Grandfather   . Stroke Father   . Colon cancer Father   . Diabetes Father   . Stroke Sister   . Lung disease Neg Hx     SOCIAL  HISTORY: Social History   Socioeconomic History  . Marital status: Divorced    Spouse name: Not on file  . Number of children: 3  . Years of education: 48  . Highest education level: Not on file  Social Needs  . Financial resource strain: Not on file  . Food insecurity - worry: Not on file  . Food insecurity - inability: Not on file  . Transportation needs - medical: Not on file  . Transportation needs - non-medical: Not on file  Occupational History  . Occupation:     Tobacco Use  . Smoking status: Former Smoker    Packs/day: 0.10    Types: Cigarettes    Last attempt to quit: 01/17/1973    Years since quitting: 44.2  . Smokeless tobacco: Never Used    Substance and Sexual Activity  . Alcohol use: No  . Drug use: No  . Sexual activity: Not Currently  Other Topics Concern  . Not on file  Social History Narrative   [facility] (03/27/16):   Originally from [facility]. Lives with her daughter in a townhouse. Patient did get lost while driving around this winter and is no longer allowed to drive. No pets currently. No bird or mold exposure.     REVIEW OF SYSTEMS: Constitutional: No fevers, chills, or sweats, no generalized fatigue, change in appetite Eyes: No visual changes, double vision, eye pain Ear, nose and throat: No hearing loss, ear pain, nasal congestion, sore throat Cardiovascular: No chest pain, palpitations Respiratory:  No shortness of breath at rest or with exertion, wheezes GastrointestinaI: No nausea, vomiting, diarrhea, abdominal pain, fecal incontinence Genitourinary:  No dysuria, urinary retention or frequency Musculoskeletal:  No neck pain, back pain Integumentary: No rash, pruritus, skin lesions Neurological: as above Psychiatric: No depression, insomnia, anxiety Endocrine: No palpitations, fatigue, diaphoresis, mood swings, change in appetite, change in weight, increased thirst Hematologic/Lymphatic:  No anemia, purpura, petechiae. Allergic/Immunologic: no itchy/runny eyes, nasal congestion, recent allergic reactions, rashes  PHYSICAL EXAM: Vitals:   03/20/17 0847  BP: 106/74  Pulse: 76  SpO2: 91%   General: No acute distress Head:  Normocephalic/atraumatic Eyes: Fundoscopic exam shows bilateral sharp discs, no vessel changes, exudates, or hemorrhages Neck: supple, no paraspinal tenderness, full range of motion Back: No paraspinal tenderness Heart: regular rate and rhythm Lungs: Clear to auscultation bilaterally. Vascular: No carotid bruits. Skin/Extremities: No rash, no edema Neurological Exam: Mental status: alert and oriented to person, place, and month/season, no dysarthria or aphasia, Fund of  knowledge is appropriate.  Recent and remote memory are impaired.  Attention and concentration are normal.   Able to name objects and repeat phrases. CDT 4/5 MMSE - Mini Mental State Exam 03/20/2017  Orientation to time 2  Orientation to Place 4  Registration 3  Attention/ Calculation 2  Recall 0  Language- name 2 objects 2  Language- repeat 1  Language- follow 3 step command 3  Language- read & follow direction 1  Write a sentence 1  Copy design 1  Total score 20   Cranial nerves: CN I: not tested CN II: pupils equal, round and reactive to light, visual fields intact, fundi unremarkable. CN III, IV, VI:  full range of motion, no nystagmus, no ptosis CN V: facial sensation intact CN VII: upper and lower face symmetric CN VIII: hearing intact to finger rub CN IX, X: gag intact, uvula midline CN XI: sternocleidomastoid and trapezius muscles intact CN XII: tongue midline Bulk & Tone: normal, no fasciculations. Motor: 5/5  throughout with no pronator drift. Sensation: intact to light touch, cold, pin, vibration and joint position sense.  No extinction to double simultaneous stimulation.  Romberg test negative Deep Tendon Reflexes: asymmetric, brisk +2 on left UE and LE, +1 on right UE and LE, no ankle clonus Plantar responses: downgoing bilaterally Cerebellar: no incoordination on finger to nose, heel to shin. No dysdiadochokinesia Gait: narrow-based and steady, able to tandem walk adequately. Tremor: none  IMPRESSION: This is a 72 year old right-handed woman with a history of  mitral valve replacement for bacterial endocarditis with thrombosis of valve s/p redo with bioprosthetic valve, atrial fibrillation on Coumadin, hypertension, hyperlipidemia, presenting for evaluation of dementia. Her neurological exam is non-focal except for asymmetric reflexes on the left, MMSE today 20/30. History and exam suggestive of Vascular dementia, mild. Diagnosis and prognosis was discussed with the  patient and her daughter, she is on Donepezil 10mg  daily, continue current medications, including aspirin and Coumadin, control of vascular risk factors for secondary stroke prevention. Her daughter is having difficulties with the diagnosis, educational resources were provided today, she will be set up with [facility] to help with getting more help at home, looking into day programs, and support. She will follow-up in 6 months and knows to call for any changes.   Thank you for allowing me to participate in the care of this patient. Please do not hesitate to call for any questions or concerns.   Patrcia Dolly, M.D.  CC: Dr. Okey Dupre

## 2017-03-20 NOTE — Patient Instructions (Signed)
1. Continue with Donepezil, control of blood pressure, cholesterol, taking medications regularly 2. We will get you connected with [facility] to help with information and local resources, recommend either day programs or getting more help at home 3. Follow-up in 6 months, call for any changes  FALL PRECAUTIONS: Be cautious when walking. Scan the area for obstacles that may increase the risk of trips and falls. When getting up in the mornings, sit up at the edge of the bed for a few minutes before getting out of bed. Consider elevating the bed at the head end to avoid drop of blood pressure when getting up. Walk always in a well-lit room (use night lights in the walls). Avoid area rugs or power cords from appliances in the middle of the walkways. Use a walker or a cane if necessary and consider physical therapy for balance exercise. Get your eyesight checked regularly.  FINANCIAL OVERSIGHT: Supervision, especially oversight when making financial decisions or transactions is also recommended.  HOME SAFETY: Consider the safety of the kitchen when operating appliances like stoves, microwave oven, and blender. Consider having supervision and share cooking responsibilities until no longer able to participate in those. Accidents with firearms and other hazards in the house should be identified and addressed as well.  DRIVING: Regarding driving, in patients with progressive memory problems, driving will be impaired. We advise to have someone else do the driving if trouble finding directions or if minor accidents are reported. Independent driving assessment is available to determine safety of driving.  ABILITY TO BE LEFT ALONE: If patient is unable to contact 911 operator, consider using [facility], or when the need is there, arrange for someone to stay with patients. Smoking is a fire hazard, consider supervision or cessation. Risk of wandering should be assessed by caregiver and if detected at any point,  supervision and safe proof recommendations should be instituted.  MEDICATION SUPERVISION: Inability to self-administer medication needs to be constantly addressed. Implement a mechanism to ensure safe administration of the medications.  RECOMMENDATIONS FOR ALL PATIENTS WITH MEMORY PROBLEMS: 1. Continue to exercise (Recommend 30 minutes of walking everyday, or 3 hours every week) 2. Increase social interactions - continue going to [place] and enjoy social gatherings with friends and family 3. Eat healthy, avoid fried foods and eat more fruits and vegetables 4. Maintain adequate blood pressure, blood sugar, and blood cholesterol level. Reducing the risk of stroke and cardiovascular disease also helps promoting better memory. 5. Avoid stressful situations. Live a simple life and avoid aggravations. Organize your time and prepare for the next day in anticipation. 6. Sleep well, avoid any interruptions of sleep and avoid any distractions in the bedroom that may interfere with adequate sleep quality 7. Avoid sugar, avoid sweets as there is a strong link between excessive sugar intake, diabetes, and cognitive impairment We discussed the Mediterranean diet, which has been shown to help patients reduce the risk of progressive memory disorders and reduces cardiovascular risk. This includes eating fish, eat fruits and green leafy vegetables, nuts like almonds and hazelnuts, walnuts, and also use olive oil. Avoid fast foods and fried foods as much as possible. Avoid sweets and sugar as sugar use has been linked to worsening of memory function.  There is always a concern of gradual progression of memory problems. If this is the case, then we may need to adjust level of care according to patient needs. Support, both to the patient and caregiver, should then be put into place.

## 2017-03-27 ENCOUNTER — Ambulatory Visit (INDEPENDENT_AMBULATORY_CARE_PROVIDER_SITE_OTHER): Payer: Medicare Other | Admitting: Pharmacist Clinician (PhC)/ Clinical Pharmacy Specialist

## 2017-03-27 DIAGNOSIS — Z953 Presence of xenogenic heart valve: Secondary | ICD-10-CM | POA: Diagnosis not present

## 2017-03-27 DIAGNOSIS — Z952 Presence of prosthetic heart valve: Secondary | ICD-10-CM

## 2017-03-27 DIAGNOSIS — Z7901 Long term (current) use of anticoagulants: Secondary | ICD-10-CM

## 2017-03-27 DIAGNOSIS — I4891 Unspecified atrial fibrillation: Secondary | ICD-10-CM | POA: Diagnosis not present

## 2017-03-27 LAB — POCT INR: INR: 1.9

## 2017-03-29 ENCOUNTER — Encounter: Payer: Self-pay | Admitting: Physician Assistant

## 2017-03-29 ENCOUNTER — Ambulatory Visit (INDEPENDENT_AMBULATORY_CARE_PROVIDER_SITE_OTHER): Payer: Medicare Other | Admitting: Physician Assistant

## 2017-03-29 VITALS — BP 94/78 | HR 70 | Temp 98.9°F | Resp 16 | Ht 64.0 in | Wt 114.0 lb

## 2017-03-29 DIAGNOSIS — M1A0310 Idiopathic chronic gout, right wrist, without tophus (tophi): Secondary | ICD-10-CM | POA: Diagnosis not present

## 2017-03-29 MED ORDER — PREDNISONE 20 MG PO TABS: 20.0000 mg | ORAL_TABLET | Freq: Two times a day (BID) | ORAL | 0 refills | Status: DC

## 2017-03-29 NOTE — Progress Notes (Signed)
[name] is a 72 y.o. female here for a recurrence of a previously resolved problem.   History of Present Illness:   Chief Complaint  Patient presents with  . Gout    pt  reports pain in right wrist    HPI   Patient with frequent gout attacks. Most recently treated on 03/05/17 for gout, was treated with 40 mg prednisone BID. Prior to that, she had an attack a few weeks before. These last 3 attacks have all been in her R wrist. She is R handed. Two years ago was getting it in her foot. This round started up around Wednesday. Patient with dementia and daughter is present today providing history. Daughter states that patient eats very healthy -- very limited meat products and no alcohol. Patient has never been treated with urate-lowering therapy to her daughter's knowledge. Denies fever, numbness or tingling. No recent imaging has been performed of R wrist.  Past Medical History:  Diagnosis Date  . Acute CHF (congestive heart failure) (HCC) 12/14/2014  . Hyperlipidemia   . Hypertension   . 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 S/P MVR (mitral valve replacement) 04/11/1998   #29 St. Jude bileaflet mechanical valve for bacterial endocarditis  . S/P redo mitral valve replacement with bioprosthetic valve 07/25/2014   27 mm [facility] Mitral bovine bioprosthetic tissue valve  . Stroke (HCC)   . 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 Valvular endocarditis 2000   Streptococcus     Social History   Socioeconomic History  . Marital status: Divorced    Spouse name: Not on file  . Number of children: 3  . Years of education: 68  . Highest education level: Not on file  Occupational History  . Occupation:     Social Needs  . Financial resource strain: Not on file  . Food insecurity:    Worry: Not on file    Inability: Not on file  . Transportation  needs:    Medical: Not on file    Non-medical: Not on file  Tobacco Use  . Smoking status: Former Smoker    Packs/day: 0.10    Types: Cigarettes    Last attempt to quit: 01/17/1973    Years since quitting: 44.2  . Smokeless tobacco: Never Used  Substance and Sexual Activity  . Alcohol use: No  . Drug use: No  . Sexual activity: Not Currently  Lifestyle  . Physical activity:    Days per week: Not on file    Minutes per session: Not on file  . Stress: Not on file  Relationships  . Social connections:    Talks on phone: Not on file    Gets together: Not on file    Attends religious service: Not on file    Active member of club or organization: Not on file    Attends meetings of clubs or organizations: Not on file    Relationship status: Not on file  . Intimate partner violence:    Fear of current or ex partner: Not on file    Emotionally abused: Not on file    Physically abused: Not on file    Forced sexual activity: Not on file  Other Topics Concern  . Not on file  Social History Narrative   [facility] (03/27/16):   Originally from [place]. Lives with her daughter in a townhouse. Patient did get  lost while driving around this winter and is no longer allowed to drive. No pets currently. No bird or mold exposure.       Has 3 children   Some college education (trade school)    Worked as Systems developer with [employer]     Past Surgical History:  Procedure Laterality Date  . ABDOMINAL HYSTERECTOMY    . CARDIAC CATHETERIZATION N/A 07/24/2014   Procedure: Right/Left Heart Cath and Coronary Angiography;  Surgeon: Iran Ouch, MD;  Location: [facility] INVASIVE CV LAB;  Service: Cardiovascular;  Laterality: N/A;  . CARDIAC CATHETERIZATION N/A 07/22/2014   Procedure: Fluoroscopy Guidance;  Surgeon: Lennette Bihari, MD;  Location: [facility] INVASIVE CV LAB;  Service: Cardiovascular;  Laterality: N/A;  . CARDIAC CATHETERIZATION N/A 12/16/2014   Procedure: Right Heart Cath;  Surgeon: Laurey Morale, MD;  Location: [facility] INVASIVE CV LAB;  Service: Cardiovascular;  Laterality: N/A;  . CARDIAC VALVE REPLACEMENT    . MITRAL VALVE REPLACEMENT  04/11/1998   St. Jude mechanical MVR (severe MR, episode of streptococcal bacterial endocarditis - Dr. Cornelius Moras  . MITRAL VALVE REPLACEMENT N/A 07/25/2014   Procedure: REDO MITRAL VALVE REPLACEMENT (MVR);  Surgeon: Purcell Nails, MD;  Location: [facility] OR;  Service: Open Heart Surgery;  Laterality: N/A;  . TOTAL ABDOMINAL HYSTERECTOMY W/ BILATERAL SALPINGOOPHORECTOMY  12/14/1998  . TRANSTHORACIC ECHOCARDIOGRAM  11/2009   EF=>55%; bi-leaflet St. Jude mech prosthesis; mild TR, RVSP elevated at 40-49mmHg, mod pulm HTN; trace AV regurg; mild pulm valve regurg  . TUBAL LIGATION      Family History  Problem Relation Age of Onset  . Lung cancer Mother   . Heart attack Mother   . Heart failure Maternal Grandmother   . Heart failure Maternal Grandfather   . Stroke Maternal Grandfather   . Stroke Father   . Colon cancer Father   . Diabetes Father   . Stroke Sister   . Lung disease Neg Hx     Allergies  Allergen Reactions  . Augmentin [Amoxicillin-Pot Clavulanate] Other (See Comments)    Unknown reaction Has patient had a PCN reaction causing immediate rash, facial/tongue/throat swelling, SOB or lightheadedness with hypotension Has patient had a PCN reaction causing severe rash involving mucus membranes or skin necrosis Has patient had a PCN reaction that required hospitalization  Has patient had a PCN reaction occurring within the last 10 years] If all of the above answers are "NO", then may proceed with Cephalosporin use.     Current Medications:   Current Outpatient Medications:  .  aspirin EC 81 MG tablet, Take 1 tablet (81 mg total) by mouth daily., Disp: 30 tablet, Rfl: 0 .  atorvastatin (LIPITOR) 40 MG tablet, Take 1 tablet (40 mg total) daily by mouth., Disp: 90 tablet, Rfl: 1 .  carvedilol (COREG) 3.125 MG tablet, TAKE 1 TABLET BY MOUTH 2  TIMES DAILY WITH A MEAL, Disp: 60 tablet, Rfl: 1 .  clindamycin (CLEOCIN) 300 MG capsule, Take 2 capsules (600 mg total) by mouth as directed. Take one hour prior to dental work, Disp: 2 capsule, Rfl: 1 .  donepezil (ARICEPT) 10 MG tablet, Take 1 tablet (10 mg total) by mouth at bedtime., Disp: 90 tablet, Rfl: 3 .  furosemide (LASIX) 20 MG tablet, TAKE 1 TABLET (20 MG TOTAL) BY MOUTH DAILY, Disp: 30 tablet, Rfl: 6 .  hydrALAZINE (APRESOLINE) 50 MG tablet, TAKE 1 & 1/2 TABLETS BY MOUTH 3 TIMES DAILY, Disp: 135 tablet, Rfl: 6 .  isosorbide mononitrate (  IMDUR) 30 MG 24 hr tablet, Take 1 tablet (30 mg total) by mouth daily., Disp: 90 tablet, Rfl: 3 .  pantoprazole (PROTONIX) 40 MG tablet, TAKE 1 TABLET (40 MG TOTAL) BY MOUTH DAILY, Disp: 30 tablet, Rfl: 6 .  predniSONE (DELTASONE) 20 MG tablet, Take 1 tablet (20 mg total) by mouth 2 (two) times daily with a meal., Disp: 10 tablet, Rfl: 0 .  warfarin (COUMADIN) 2 MG tablet, Take 1 to 1.5 tablets by mouth daily or as directed by coumadin clinic, Disp: 60 tablet, Rfl: 5   Review of Systems:   ROS  Negative unless otherwise specified per HPI.  Vitals:   Vitals:   03/29/17 1036  BP: 94/78  Pulse: 70  Resp: 16  Temp: 98.9 F (37.2 C)  TempSrc: Oral  SpO2: 99%  Weight: 114 lb (51.7 kg)  Height: 5\' 4"  (1.626 m)     Body mass index is 19.57 kg/m.  Physical Exam:   Physical Exam  Constitutional: She appears well-developed. She is cooperative.  Non-toxic appearance. She does not have a sickly appearance. She does not appear ill. No distress.  Cardiovascular: Normal rate, regular rhythm, S1 normal, S2 normal, normal heart sounds and normal pulses.  No LE edema; capillary refill normal in R fingers  Pulmonary/Chest: Effort normal and breath sounds normal.  Musculoskeletal:  R wrist with localized swelling. Tenderness with mild palpation. Decreased ROM 2/2 pain with flexion and extension of wrist.  Radial pulse 2+. No ecchymosis.   Neurological: She is alert. GCS eye subscore is 4. GCS verbal subscore is 5. GCS motor subscore is 6.  No decreased sensation to R wrist or hand.  Skin: Skin is warm, dry and intact.  Psychiatric: She has a normal mood and affect. Her speech is normal and behavior is normal.  Nursing note and vitals reviewed.     Assessment and Plan:    [name] was seen today for gout.  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   She has successfully been treated with prednisone in the past for gout flares. Will resume treatment at this time. I recommended follow-up with Dr. Okey Dupre to discuss possible medication options for prevention of future gout attacks or further work-up given how frequently these attacks are occurring. Daughter and patient verbalized understanding. Follow-up if symptoms worsen or persist despite treatment. No red flags on my exam today. Consider imaging of R wrist.  . Reviewed expectations re: course of current medical issues. . Discussed self-management of symptoms. . Outlined signs and symptoms indicating need for more acute intervention. . Patient verbalized understanding and all questions were answered. . See orders for this visit as documented in the electronic medical record. . Patient received an After-Visit Summary.  Jarold Motto, PA-C

## 2017-03-29 NOTE — Patient Instructions (Signed)
It was great to meet you!  Start the prednisone that we have sent in.  Because you are having gout attacks so frequently, I recommend that you consider talking to Dr. Okey Dupre about preventative medications for gout.

## 2017-04-03 ENCOUNTER — Telehealth: Payer: Self-pay | Admitting: Internal Medicine

## 2017-04-03 ENCOUNTER — Encounter: Payer: Self-pay | Admitting: Internal Medicine

## 2017-04-03 ENCOUNTER — Ambulatory Visit (INDEPENDENT_AMBULATORY_CARE_PROVIDER_SITE_OTHER): Payer: Medicare Other | Admitting: Internal Medicine

## 2017-04-03 VITALS — BP 110/70 | HR 45 | Temp 97.5°F | Ht 64.0 in | Wt 120.0 lb

## 2017-04-03 DIAGNOSIS — Z23 Encounter for immunization: Secondary | ICD-10-CM | POA: Diagnosis not present

## 2017-04-03 DIAGNOSIS — I63411 Cerebral infarction due to embolism of right middle cerebral artery: Secondary | ICD-10-CM | POA: Diagnosis not present

## 2017-04-03 DIAGNOSIS — M1A0310 Idiopathic chronic gout, right wrist, without tophus (tophi): Secondary | ICD-10-CM | POA: Diagnosis not present

## 2017-04-03 MED ORDER — PREDNISONE 20 MG PO TABS: 20.0000 mg | ORAL_TABLET | Freq: Two times a day (BID) | ORAL | 0 refills | Status: DC

## 2017-04-03 MED ORDER — COLCHICINE 0.6 MG PO TABS: 0.6000 mg | ORAL_TABLET | Freq: Every day | ORAL | 0 refills | Status: DC

## 2017-04-03 MED ORDER — ALLOPURINOL 100 MG PO TABS: 100.0000 mg | ORAL_TABLET | Freq: Every day | ORAL | 6 refills | Status: DC

## 2017-04-03 NOTE — Telephone Encounter (Signed)
There is no generic for colchicine. She can try good rx for it or can just try the allopurinol alone and if flare start prednisone quickly.

## 2017-04-03 NOTE — Telephone Encounter (Signed)
FYI Contacted patients daughter and she states that she tried another pharmacy with the GoodRx card and it was a little cheaper at 150.00 and since it is just one month she is going to get it for her mom.

## 2017-04-03 NOTE — Telephone Encounter (Signed)
Copied from CRM [id number]. Topic: Quick Communication - See Telephone Encounter >> Apr 03, 2017  2:15 PM Darletta Moll L wrote: CRM for notification. See Telephone encounter for: 04/03/17. Patient's daughter calling to find out when she will hear back about a resolution to one of the 3 scripts called into today costing around $200 which is not affordable to the patient? Daughter, [name] did not know which one it was. She said Dr. Frutoso Chase cma said she'd call her back today.

## 2017-04-03 NOTE — Telephone Encounter (Signed)
The Colchicine is the medication patients daughter is talking about

## 2017-04-03 NOTE — Progress Notes (Signed)
   Subjective:    Patient ID: [name], female    DOB: 04/06/1945, 72 y.o.   MRN: [id number]  HPI The patient is a 72 YO female coming in for recurrent gout flares. Currently no flare but just finished prednisone flare. They are wondering if she can start a medicine to prevent gout. Denies injury to the area. No red meat or alcohol at all. No change to diet prior to flares. Previously has had in foot and wrist but most recently wrist is flaring up. She does have dementia so generally the flare is bad before it is noticed.   Review of Systems  Constitutional: Positive for activity change. Negative for appetite change, chills, fatigue, fever and unexpected weight change.  Respiratory: Negative.   Cardiovascular: Negative.   Gastrointestinal: Negative.   Musculoskeletal: Positive for arthralgias, joint swelling and myalgias. Negative for back pain and gait problem.  Skin: Negative.   Neurological: Negative.       Objective:   Physical Exam  Constitutional: She appears well-developed and well-nourished.  HENT:  Head: Normocephalic and atraumatic.  Eyes: EOM are normal.  Neck: Normal range of motion.  Cardiovascular: Normal rate and regular rhythm.  Pulmonary/Chest: Effort normal and breath sounds normal. No respiratory distress. She has no wheezes. She has no rales.  Abdominal: Soft. Bowel sounds are normal. She exhibits no distension. There is no tenderness. There is no rebound.  Musculoskeletal: She exhibits no edema.  Neurological: She is alert. Coordination normal.  Skin: Skin is warm and dry.   Vitals:   04/03/17 0824  BP: 110/70  Temp: (!) 97.5 F (36.4 C)  TempSrc: Oral  SpO2: 99%  Weight: 120 lb (54.4 kg)  Height: 5\' 4"  (1.626 m)      Assessment & Plan:

## 2017-04-03 NOTE — Patient Instructions (Signed)
We have sent in the refill of prednisone for if needed.   We have sent in allopurinol which is the long term gout medicine to prevent flares. Take 1 pill daily and we will check labs when you are back in 1 month.   We have also sent in cholcrys which is a once a day medicine to prevent gout pain if you do have a flare from the new medicine. You will just take this for 1 month while starting the allopurinol.

## 2017-04-04 NOTE — Assessment & Plan Note (Signed)
Rx for allopurinol and colchicine. Return in 1 month for labs and adjust dosing and stop colchicine as needed.

## 2017-04-11 ENCOUNTER — Ambulatory Visit (INDEPENDENT_AMBULATORY_CARE_PROVIDER_SITE_OTHER): Payer: Medicare Other | Admitting: Pharmacist Clinician (PhC)/ Clinical Pharmacy Specialist

## 2017-04-11 DIAGNOSIS — Z953 Presence of xenogenic heart valve: Secondary | ICD-10-CM | POA: Diagnosis not present

## 2017-04-11 DIAGNOSIS — Z952 Presence of prosthetic heart valve: Secondary | ICD-10-CM

## 2017-04-11 DIAGNOSIS — I4891 Unspecified atrial fibrillation: Secondary | ICD-10-CM

## 2017-04-11 DIAGNOSIS — Z7901 Long term (current) use of anticoagulants: Secondary | ICD-10-CM | POA: Diagnosis not present

## 2017-04-11 LAB — POCT INR: INR: 1.9

## 2017-04-24 ENCOUNTER — Ambulatory Visit (INDEPENDENT_AMBULATORY_CARE_PROVIDER_SITE_OTHER): Payer: Medicare Other | Admitting: Pharmacist Clinician (PhC)/ Clinical Pharmacy Specialist

## 2017-04-24 DIAGNOSIS — Z952 Presence of prosthetic heart valve: Secondary | ICD-10-CM

## 2017-04-24 DIAGNOSIS — I4891 Unspecified atrial fibrillation: Secondary | ICD-10-CM

## 2017-04-24 DIAGNOSIS — Z953 Presence of xenogenic heart valve: Secondary | ICD-10-CM

## 2017-04-24 DIAGNOSIS — Z7901 Long term (current) use of anticoagulants: Secondary | ICD-10-CM | POA: Diagnosis not present

## 2017-04-24 LAB — POCT INR: INR: 2.7

## 2017-04-24 NOTE — Patient Instructions (Signed)
Description   Continue with 1.5 tablets daily except 1 tablet each Sunday and Thursday.  Repeat INR in 2 weeks

## 2017-05-01 ENCOUNTER — Encounter: Payer: Self-pay | Admitting: Internal Medicine

## 2017-05-01 ENCOUNTER — Ambulatory Visit (INDEPENDENT_AMBULATORY_CARE_PROVIDER_SITE_OTHER): Payer: Medicare Other | Admitting: Internal Medicine

## 2017-05-01 ENCOUNTER — Ambulatory Visit (INDEPENDENT_AMBULATORY_CARE_PROVIDER_SITE_OTHER): Payer: Medicare Other | Admitting: Pharmacist

## 2017-05-01 VITALS — BP 120/54 | HR 54 | Ht 64.0 in | Wt 117.0 lb

## 2017-05-01 DIAGNOSIS — E785 Hyperlipidemia, unspecified: Secondary | ICD-10-CM

## 2017-05-01 DIAGNOSIS — Z953 Presence of xenogenic heart valve: Secondary | ICD-10-CM | POA: Diagnosis not present

## 2017-05-01 DIAGNOSIS — Z7901 Long term (current) use of anticoagulants: Secondary | ICD-10-CM

## 2017-05-01 DIAGNOSIS — I63411 Cerebral infarction due to embolism of right middle cerebral artery: Secondary | ICD-10-CM

## 2017-05-01 DIAGNOSIS — I4891 Unspecified atrial fibrillation: Secondary | ICD-10-CM

## 2017-05-01 DIAGNOSIS — Z952 Presence of prosthetic heart valve: Secondary | ICD-10-CM

## 2017-05-01 LAB — POCT INR: INR: 1.9

## 2017-05-01 NOTE — Patient Instructions (Signed)
Your physician recommends that you return for lab work FASTING to check cholesterol  Your physician has requested that you have an echocardiogram in ONE YEAR - prior to your next visit. Echocardiography is a painless test that uses sound waves to create images of your heart. It provides your doctor with information about the size and shape of your heart and how well your heart's chambers and valves are working. This procedure takes approximately one hour. There are no restrictions for this procedure.  Your physician wants you to follow-up in: ONE YEAR with Dr. Rennis Golden. You will receive a reminder letter in the mail two months in advance. If you don't receive a letter, please call our office to schedule the follow-up appointment.

## 2017-05-01 NOTE — Progress Notes (Signed)
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 She ultimately had severe mitral regurgitation and destruction of the mitral valve necessitating emergent replacement with a 29 mm mechanical St. Jude valve by Dr. Cornelius Moras. Since that time she's done very well. She continues to exercise and did not get any shortness of breath or chest pain. She also has dyslipidemia and hypertension which is well managed. She is chronically on warfarin therapy which is followed in this office.  Her last echocardiogram to evaluate valvular gradients was in 2011 which showed an EF of  greater than 55%. The gradients across the valve were 9 and 4.9 mm Hg.  I saw [name] back in the office today. She is doing extremely well. She denies any shortness of breath or worsening chest pain. She continues to exercise. Her weight is appropriate. Carina was mildly elevated today and is followed by [name]. Her mitral valve apparently is working well. Her last echocardiogram in 2015 showed stable gradients and normal LV function. She is due for cholesterol reassessment. Of note her blood pressure is elevated today. She says that she's been out of her medicines for a little while and that there were no refills that were authorized, however our system indicates that they are available.  [name] returns today for follow-up from her recent hospitalization. She was admitted for congestive heart failure. An echocardiogram showed an EF of 25-30%. This represents an acute decline from her preoperative EF which was normal this past summer. Unfortunately the summer she developed valve thrombosis and had to have a redo mitral valve surgery. She was doing fairly well for that for while and then developed worsening shortness of breath and was  found to have cardiomyopathy. It's unclear whether her EF drop was perioperative course at some later time. She was diuresed and treated by the advanced heart failure service. She had a right heart catheterization and since her hospitalization has been doing better. She does not appear to have had recurrent atrial fibrillation and is been on amiodarone therapy as well as warfarin anticoagulation. She says she has had a productive cough for the last several weeks after an upper respiratory infection but it is improving. Blood pressure today is noted to be very low at 94/72. Her weight is actually 5 pounds lower than her discharge weight.  05/19/2015  [name] returns today for follow-up. Overall she's been doing fairly well. Blood pressure has been running higher recently. Her hydralazine is at 50 mg 3 times a day. She's also on carvedilol 3125 twice a day however heart rate is in the low 60s. She appears euvolemic and her weight is been stable. She is taking amiodarone without recurrent A. fib. I had a discussion with her neurologist about her anticoagulation. She is interested in her going on to a novel oral anticoagulant, however I stressed the fact that there is little data and no FDA approval for use of those medications in patients with valvular A. fib. I would be concerned about making that switch. I feel that with better attempts at compliance we would be able to get more regulated INR levels. In addition, her bioprosthetic valve is much less likely at this point to thrombose (essentially the warfarin is for a-fib).  12/22/2015  [name] was seen today in  follow-up. She reports doing fairly well. Her daughter was accompanying her today and has been helping her with her medications. She's been placing her warfarin into her pill boxes and that is helped with compliance. INRs have recently been very well controlled. She does have valvular A. fib and as previously mentioned, the novel  oral anticoagulants are not FDA approved for valvular atrial fibrillation. I would feel more comfortable if we continue with warfarin. She denies any worsening heart failure symptoms and remains on low-dose furosemide. Blood pressure was mildly elevated today however she did not take her morning medications. She is maintaining sinus rhythm and is had not recurrence of atrial fibrillation on low-dose amiodarone. She is due for repeat thyroid, liver function and pulmonary function tests.  08/30/2016  [name] was seen today in follow-up. We're now checking her INRs every 2-3 weeks due to significant lability. There is been a history of memory loss and confusion and we suspect noncompliance with actually taking her medicines. Although the medicines are laid out, it is not always confirmed that she is taking the pills. Unfortunately today her INR was very low 1.4. Her daughter mentioned that she had been staying with her son for the past week. Although overall she is asymptomatic and her asked echo was in February which showed a normal valve gradient and an improved ejection fraction is 60-65%. She was supposed to undergo dental surgery but has not yet had that. She will need antibiotic prophylaxis and is written for clindamycin.  05/01/2017  [name] returns today for follow-up.  Over the past year she is done well.  Today her INR was slightly low at 1.9.  Her daughter reports she has had some progressive memory loss.  Her echo last year showed stable valve gradients and normal LVEF.  She denies any chest pain or worsening shortness of breath.  She has been on some prednisone recently for gout.  She is now on allopurinol.  She also takes atorvastatin and is not had a repeat lipid profile since May 2018.  PMHx:  Past Medical History:  Diagnosis Date  . Acute CHF (congestive heart failure) (HCC) 12/14/2014  . Hyperlipidemia   . Hypertension   . Prosthetic valve dysfunction 07/22/2014   Acute  mitral stenosis due to restricted leaflet mobility of bileaflet mechanical mitral valve prosthesis  . S/P MVR (mitral valve replacement) 04/11/1998   #29 St. Jude bileaflet mechanical valve for bacterial endocarditis  . S/P redo mitral valve replacement with bioprosthetic valve 07/25/2014   27 mm [facility] Mitral bovine bioprosthetic tissue valve  . Stroke (HCC)   . 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 Valvular endocarditis 2000   Streptococcus    Past Surgical History:  Procedure Laterality Date  . ABDOMINAL HYSTERECTOMY    . CARDIAC CATHETERIZATION N/A 07/24/2014   Procedure: Right/Left Heart Cath and Coronary Angiography;  Surgeon: Iran Ouch, MD;  Location: [facility] INVASIVE CV LAB;  Service: Cardiovascular;  Laterality: N/A;  . CARDIAC CATHETERIZATION N/A 07/22/2014   Procedure: Fluoroscopy Guidance;  Surgeon: Lennette Bihari, MD;  Location: [facility] INVASIVE CV LAB;  Service: Cardiovascular;  Laterality: N/A;  . CARDIAC CATHETERIZATION N/A 12/16/2014   Procedure: Right Heart Cath;  Surgeon: Laurey Morale, MD;  Location: [facility] INVASIVE CV LAB;  Service: Cardiovascular;  Laterality: N/A;  . CARDIAC VALVE REPLACEMENT    . MITRAL VALVE REPLACEMENT  04/11/1998   St. Jude mechanical MVR (severe MR, episode of streptococcal bacterial endocarditis - Dr.  Owen  . MITRAL VALVE REPLACEMENT N/A 07/25/2014   Procedure: REDO MITRAL VALVE REPLACEMENT (MVR);  Surgeon: Purcell Nails, MD;  Location: [facility] OR;  Service: Open Heart Surgery;  Laterality: N/A;  . TOTAL ABDOMINAL HYSTERECTOMY W/ BILATERAL SALPINGOOPHORECTOMY  12/14/1998  . TRANSTHORACIC ECHOCARDIOGRAM  11/2009   EF=>55%; bi-leaflet St. Jude mech prosthesis; mild TR, RVSP elevated at 40-2mmHg, mod pulm HTN; trace AV regurg; mild pulm valve regurg  . TUBAL LIGATION      FAMHx:  Family History  Problem Relation Age of Onset  . Lung cancer Mother   . Heart attack Mother   . Heart failure Maternal Grandmother   . Heart failure Maternal  Grandfather   . Stroke Maternal Grandfather   . Stroke Father   . Colon cancer Father   . Diabetes Father   . Stroke Sister   . Lung disease Neg Hx     SOCHx:   reports that she quit smoking about 44 years ago. Her smoking use included cigarettes. She smoked 0.10 packs per day. She has never used smokeless tobacco. She reports that she does not drink alcohol or use drugs.  ALLERGIES:  Allergies  Allergen Reactions  . Augmentin [Amoxicillin-Pot Clavulanate] Other (See Comments)    Unknown reaction Has patient had a PCN reaction causing immediate rash, facial/tongue/throat swelling, SOB or lightheadedness with hypotension Has patient had a PCN reaction causing severe rash involving mucus membranes or skin necrosis Has patient had a PCN reaction that required hospitalization  Has patient had a PCN reaction occurring within the last 10 years] If all of the above answers are "NO", then may proceed with Cephalosporin use.     ROS: Pertinent items noted in HPI and remainder of comprehensive ROS otherwise negative.  HOME MEDS: Current Outpatient Medications  Medication Sig Dispense Refill  . allopurinol (ZYLOPRIM) 100 MG tablet Take 1 tablet (100 mg total) by mouth daily. 30 tablet 6  . aspirin EC 81 MG tablet Take 1 tablet (81 mg total) by mouth daily. 30 tablet 0  . atorvastatin (LIPITOR) 40 MG tablet Take 1 tablet (40 mg total) daily by mouth. 90 tablet 1  . carvedilol (COREG) 3.125 MG tablet TAKE 1 TABLET BY MOUTH 2 TIMES DAILY WITH A MEAL 60 tablet 1  . clindamycin (CLEOCIN) 300 MG capsule Take 2 capsules (600 mg total) by mouth as directed. Take one hour prior to dental work 2 capsule 1  . donepezil (ARICEPT) 10 MG tablet Take 1 tablet (10 mg total) by mouth at bedtime. 90 tablet 3  . furosemide (LASIX) 20 MG tablet TAKE 1 TABLET (20 MG TOTAL) BY MOUTH DAILY 30 tablet 6  . hydrALAZINE (APRESOLINE) 50 MG tablet TAKE 1 & 1/2 TABLETS BY MOUTH 3 TIMES DAILY 135 tablet 6  . isosorbide  mononitrate (IMDUR) 30 MG 24 hr tablet Take 1 tablet (30 mg total) by mouth daily. 90 tablet 3  . pantoprazole (PROTONIX) 40 MG tablet TAKE 1 TABLET (40 MG TOTAL) BY MOUTH DAILY 30 tablet 6  . predniSONE (DELTASONE) 20 MG tablet Take 1 tablet (20 mg total) by mouth 2 (two) times daily with a meal. 10 tablet 0  . warfarin (COUMADIN) 2 MG tablet Take 1 to 1.5 tablets by mouth daily or as directed by coumadin clinic 60 tablet 5   No current facility-administered medications for this visit.     LABS/IMAGING: Results for orders placed or performed in visit on 04/24/17 (from the past 48 hour(s))  POCT INR  Status: None   Collection Time: 05/01/17  8:53 AM  Result Value Ref Range   INR 1.9    No results found.  VITALS: BP (!) 120/54   Pulse (!) 54   Ht 5\' 4"  (1.626 m)   Wt 117 lb (53.1 kg)   LMP  (LMP Unknown)   BMI 20.08 kg/m   EXAM: General appearance: alert and no distress Neck: no carotid bruit, no JVD and thyroid not enlarged, symmetric, no tenderness/mass/nodules Lungs: clear to auscultation bilaterally Heart: regular rate and rhythm, S1, S2 normal and systolic murmur: early systolic 2/6, crescendo at lower left sternal border Abdomen: soft, non-tender; bowel sounds normal; no masses,  no organomegaly Extremities: extremities normal, atraumatic, no cyanosis or edema Pulses: 2+ and symmetric Skin: Skin color, texture, turgor normal. No rashes or lesions Neurologic: Mental status: Awake, follows commands Psych: Pleasant  EKG: Sinus bradycardia 54, RBBB-personally reviewed  ASSESSMENT: 1. History of 29 mm St. Jude mechanical mitral valve replacement (2000) for SBE with valve thrombosis and subsequent re-do MVR with a 27 mm Edwards Magna-Ease bioprosthetic valve 2. Acute systolic congestive heart failure-EF 25-30% (improved to 60-65% by echo in 02/2016) 3. HTN 4. Dyslipidemia 5. Paroxysmal atrial fibrillation- CHADSVASC score of 6 6. Chronic anticoagulation on  warfarin 7. Right bundle branch block 8. Progressive memory loss  PLAN: 1.   [name] continues to be asymptomatic and her echo showed normal valve function last year.  LVEF had improved to 6065%.  She now takes Lasix as needed.  She is off of amiodarone and maintaining sinus rhythm.  She is anticoagulated on warfarin and INR was 1.9 today and will be adjusted.  Blood pressure is at goal.  She has had some progressive memory loss and is followed by neurology.  We will check a lipid profile today and adjust medications accordingly.  Plan follow-up with me annually or sooner as necessary.  Chrystie Nose, MD, [facility], FACP  [place]  [facility]  Medical Director of the Advanced Lipid [facility] &  Cardiovascular Risk Reduction Clinic Diplomate of the American Board of Clinical Lipidology Attending Cardiologist  Direct Dial: [phone]  Fax: [phone]  Website:  [email or web address]  Lisette Abu Elidia Bonenfant 05/01/2017, 10:17 AM

## 2017-05-06 ENCOUNTER — Other Ambulatory Visit: Payer: Self-pay | Admitting: Internal Medicine

## 2017-05-06 NOTE — Telephone Encounter (Signed)
Rx sent to pharmacy   

## 2017-05-15 ENCOUNTER — Ambulatory Visit (INDEPENDENT_AMBULATORY_CARE_PROVIDER_SITE_OTHER): Payer: Medicare Other | Admitting: Pharmacist Clinician (PhC)/ Clinical Pharmacy Specialist

## 2017-05-15 DIAGNOSIS — Z952 Presence of prosthetic heart valve: Secondary | ICD-10-CM

## 2017-05-15 DIAGNOSIS — I4891 Unspecified atrial fibrillation: Secondary | ICD-10-CM | POA: Diagnosis not present

## 2017-05-15 DIAGNOSIS — Z7901 Long term (current) use of anticoagulants: Secondary | ICD-10-CM | POA: Diagnosis not present

## 2017-05-15 LAB — POCT INR: INR: 2.6

## 2017-05-15 NOTE — Patient Instructions (Signed)
Description   Continue with 1.5 tablets daily except 1 tablet each Sunday and Thursday.  Repeat INR in 2 weeks     

## 2017-05-22 NOTE — Progress Notes (Signed)
Subjective:   [name] is a 72 y.o. female who presents for an Initial Medicare Annual Wellness Visit.  Review of Systems    No ROS.  Medicare Wellness Visit. Additional risk factors are reflected in the social history.   Cardiac Risk Factors include: advanced age (>17men, >83 women);dyslipidemia;hypertension Sleep patterns: feels rested on waking, gets up 1 times nightly to void and sleeps 7-8 hours nightly.    Home Safety/Smoke Alarms: Feels safe in home. Smoke alarms in place.  Living environment; residence and Firearm Safety: 1-story house/ trailer, no firearms. Lives with daughter, no needs for DME, good support system Seat Belt Safety/Bike Helmet: Wears seat belt.   Objective:    Today's Vitals   05/23/17 0927  BP: 112/82  Pulse: (!) 53  Resp: 16  SpO2: 99%  Weight: 114 lb (51.7 kg)  Height: 5\' 4"  (1.626 m)   Body mass index is 19.57 kg/m.  Advanced Directives 05/23/2017 02/22/2015 02/21/2015 02/06/2015 12/14/2014 12/14/2014 08/03/2014  Does Patient Have a Medical Advance Directive? No No No No No No No  Does patient want to make changes to medical advance directive? Yes (ED - Information included in AVS) - - - - - -  Would patient like information on creating a medical advance directive? - No - patient declined information No - patient declined information No - patient declined information No - patient declined information - -    Current Medications (verified) Outpatient Encounter Medications as of 05/23/2017  Medication Sig  . allopurinol (ZYLOPRIM) 100 MG tablet Take 1 tablet (100 mg total) by mouth daily.  Marland Kitchen aspirin EC 81 MG tablet Take 1 tablet (81 mg total) by mouth daily.  Marland Kitchen atorvastatin (LIPITOR) 40 MG tablet TAKE 1 TABLET BY MOUTH DAILY  . carvedilol (COREG) 3.125 MG tablet TAKE 1 TABLET BY MOUTH 2 TIMES DAILY WITH A MEAL  . donepezil (ARICEPT) 10 MG tablet Take 1 tablet (10 mg total) by mouth at bedtime.  . furosemide (LASIX) 20 MG tablet TAKE 1  TABLET (20 MG TOTAL) BY MOUTH DAILY  . hydrALAZINE (APRESOLINE) 50 MG tablet TAKE 1 & 1/2 TABLETS BY MOUTH 3 TIMES DAILY  . isosorbide mononitrate (IMDUR) 30 MG 24 hr tablet Take 1 tablet (30 mg total) by mouth daily.  . pantoprazole (PROTONIX) 40 MG tablet TAKE 1 TABLET (40 MG TOTAL) BY MOUTH DAILY  . warfarin (COUMADIN) 2 MG tablet Take 1 to 1.5 tablets by mouth daily or as directed by coumadin clinic  . [DISCONTINUED] clindamycin (CLEOCIN) 300 MG capsule Take 2 capsules (600 mg total) by mouth as directed. Take one hour prior to dental work (Patient not taking: Reported on 05/23/2017)  . [DISCONTINUED] predniSONE (DELTASONE) 20 MG tablet Take 1 tablet (20 mg total) by mouth 2 (two) times daily with a meal. (Patient not taking: Reported on 05/23/2017)   No facility-administered encounter medications on file as of 05/23/2017.     Allergies (verified) Augmentin [amoxicillin-pot clavulanate]   History: Past Medical History:  Diagnosis Date  . Acute CHF (congestive heart failure) (HCC) 12/14/2014  . Hyperlipidemia   . Hypertension   . 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 S/P MVR (mitral valve replacement) 04/11/1998   #29 St. Jude bileaflet mechanical valve for bacterial endocarditis  . S/P redo mitral valve replacement with bioprosthetic valve 07/25/2014   27 mm [facility] Mitral bovine bioprosthetic tissue valve  .  Stroke (HCC)   . 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 Valvular endocarditis 2000   Streptococcus   Past Surgical History:  Procedure Laterality Date  . ABDOMINAL HYSTERECTOMY    . CARDIAC CATHETERIZATION N/A 07/24/2014   Procedure: Right/Left Heart Cath and Coronary Angiography;  Surgeon: Iran Ouch, MD;  Location: [facility] INVASIVE CV LAB;  Service: Cardiovascular;  Laterality: N/A;  . CARDIAC CATHETERIZATION N/A 07/22/2014   Procedure: Fluoroscopy  Guidance;  Surgeon: Lennette Bihari, MD;  Location: [facility] INVASIVE CV LAB;  Service: Cardiovascular;  Laterality: N/A;  . CARDIAC CATHETERIZATION N/A 12/16/2014   Procedure: Right Heart Cath;  Surgeon: Laurey Morale, MD;  Location: [facility] INVASIVE CV LAB;  Service: Cardiovascular;  Laterality: N/A;  . CARDIAC VALVE REPLACEMENT    . MITRAL VALVE REPLACEMENT  04/11/1998   St. Jude mechanical MVR (severe MR, episode of streptococcal bacterial endocarditis - Dr. Cornelius Moras  . MITRAL VALVE REPLACEMENT N/A 07/25/2014   Procedure: REDO MITRAL VALVE REPLACEMENT (MVR);  Surgeon: Purcell Nails, MD;  Location: [facility] OR;  Service: Open Heart Surgery;  Laterality: N/A;  . TOTAL ABDOMINAL HYSTERECTOMY W/ BILATERAL SALPINGOOPHORECTOMY  12/14/1998  . TRANSTHORACIC ECHOCARDIOGRAM  11/2009   EF=>55%; bi-leaflet St. Jude mech prosthesis; mild TR, RVSP elevated at 40-82mmHg, mod pulm HTN; trace AV regurg; mild pulm valve regurg  . TUBAL LIGATION     Family History  Problem Relation Age of Onset  . Lung cancer Mother   . Heart attack Mother   . Heart failure Maternal Grandmother   . Heart failure Maternal Grandfather   . Stroke Maternal Grandfather   . Stroke Father   . Colon cancer Father   . Diabetes Father   . Stroke Sister   . Lung disease Neg Hx    Social History   Socioeconomic History  . Marital status: Divorced    Spouse name: Not on file  . Number of children: 3  . Years of education: 69  . Highest education level: Not on file  Occupational History  . Occupation:     Social Needs  . Financial resource strain: Not very hard  . Food insecurity:    Worry: Sometimes true    Inability: Sometimes true  . Transportation needs:    Medical: No    Non-medical: No  Tobacco Use  . Smoking status: Former Smoker    Packs/day: 0.10    Types: Cigarettes    Last attempt to quit: 01/17/1973    Years since quitting: 44.3  . Smokeless tobacco: Never Used  Substance and Sexual Activity  . Alcohol use: No  . Drug  use: No  . Sexual activity: Not Currently  Lifestyle  . Physical activity:    Days per week: 0 days    Minutes per session: 0 min  . Stress: Not at all  Relationships  . Social connections:    Talks on phone: More than three times a week    Gets together: More than three times a week    Attends religious service: More than 4 times per year    Active member of club or organization: Yes    Attends meetings of clubs or organizations: 1 to 4 times per year    Relationship status: Not on file  Other Topics Concern  . Not on file  Social History Narrative   [facility] (03/27/16):   Originally from [facility]. Lives with her daughter in a townhouse. Patient did get lost while driving around this winter  and is no longer allowed to drive. No pets currently. No bird or mold exposure.       Has 3 children   Some college education (trade school)    Worked as Systems developer with [employer]     Tobacco Counseling Counseling given: Not Answered  Activities of Daily Living In your present state of health, do you have any difficulty performing the following activities: 05/23/2017  Hearing? N  Vision? N  Difficulty concentrating or making decisions? Y  Walking or climbing stairs? N  Dressing or bathing? N  Doing errands, shopping? Y  Preparing Food and eating ? Y  Using the Toilet? N  In the past six months, have you accidently leaked urine? N  Do you have problems with loss of bowel control? N  Managing your Medications? Y  Managing your Finances? Y  Housekeeping or managing your Housekeeping? Y  Some recent data might be hidden     Immunizations and Health Maintenance Immunization History  Administered Date(s) Administered  . Influenza, High Dose Seasonal PF 10/15/2016  . Influenza-Unspecified 09/08/2015  . Pneumococcal Conjugate-13 04/03/2017   Health Maintenance Due  Topic Date Due  . TETANUS/TDAP  09/03/1966  . MAMMOGRAM  09/02/1997  . COLONOSCOPY  09/02/1997  . DEXA  SCAN  09/02/2012    Patient Care Team: Myrlene Broker, MD as PCP - General (Internal Medicine) Rennis Golden Lisette Abu, MD as PCP - Cardiology (Cardiology)  Indicate any recent Medical Services you may have received from other than [facility] providers in the past year (date may be approximate).     Assessment:   This is a routine wellness examination for [name]. Physical assessment deferred to PCP.   Hearing/Vision screen  Visual Acuity Screening   Right eye Left eye Both eyes  Without correction:   20/20  With correction:     Comments: Daughter states she will make an eye appointment soon.   Hearing Screening Comments: Able to hear conversational tones w/o difficulty. No issues reported. Passed whisper test   Dietary issues and exercise activities discussed: Current Exercise Habits: The patient does not participate in regular exercise at present(chair exercise pamphlets provided)  Diet (meal preparation, eat out, water intake, caffeinated beverages, dairy products, fruits and vegetables): in general, a "healthy" diet  , well balanced   Reviewed heart healthy diet, encouraged patient to increase daily water intake.    Goals    . Patient Stated     Do chair exercises, start to knit again, and listen to music. Enjoy family and grandchildren.      Depression Screen PHQ 2/9 Scores 05/23/2017 10/15/2016 02/21/2015 01/30/2015 12/13/2014  PHQ - 2 Score 1 0 0 0 0  PHQ- 9 Score 1 - - - -    Fall Risk Fall Risk  05/23/2017 10/15/2016 08/13/2016 09/29/2015 04/25/2015  Falls in the past year? No No No No No   Cognitive Function: MMSE - Mini Mental State Exam 05/23/2017 03/20/2017  Orientation to time 0 2  Orientation to Place 0 4  Registration 3 3  Attention/ Calculation 2 2  Recall 0 0  Language- name 2 objects 2 2  Language- repeat 1 1  Language- follow 3 step command 3 3  Language- read & follow direction 1 1  Write a sentence 1 1  Copy design 1 1  Total score 14 20         Screening Tests Health Maintenance  Topic Date Due  . TETANUS/TDAP  09/03/1966  . MAMMOGRAM  09/02/1997  . COLONOSCOPY  09/02/1997  . DEXA SCAN  09/02/2012  . INFLUENZA VACCINE  08/07/2017  . PNA vac Low Risk Adult (2 of 2 - PPSV23) 04/04/2018  . Hepatitis C Screening  Completed     Plan:     Nurse will research low cost respite resources that come to the home and caregiver support group resources and will mail the information to the daughter.  Start doing brain stimulating activities (puzzles, reading, adult coloring books, staying active) to keep memory sharp.   Continue to eat heart healthy diet (full of fruits, vegetables, whole grains, lean protein, water--limit salt, fat, and sugar intake) and increase physical activity as tolerate  I have personally reviewed and noted the following in the patient's chart:   . Medical and social history . Use of alcohol, tobacco or illicit drugs  . Current medications and supplements . Functional ability and status . Nutritional status . Physical activity . Advanced directives . List of other physicians . Hospitalizations, surgeries, and ER visits in previous 12 months . Vitals . Screenings to include cognitive, depression, and falls . Referrals and appointments  In addition, I have reviewed and discussed with patient certain preventive protocols, quality metrics, and best practice recommendations. A written personalized care plan for preventive services as well as general preventive health recommendations were provided to patient.     Wanda Plump, RN   05/23/2017

## 2017-05-23 ENCOUNTER — Ambulatory Visit: Payer: Medicare Other | Admitting: Internal Medicine

## 2017-05-23 ENCOUNTER — Encounter: Payer: Self-pay | Admitting: Internal Medicine

## 2017-05-23 ENCOUNTER — Ambulatory Visit (INDEPENDENT_AMBULATORY_CARE_PROVIDER_SITE_OTHER): Payer: Medicare Other | Admitting: *Deleted

## 2017-05-23 ENCOUNTER — Ambulatory Visit (INDEPENDENT_AMBULATORY_CARE_PROVIDER_SITE_OTHER): Payer: Medicare Other | Admitting: Internal Medicine

## 2017-05-23 ENCOUNTER — Other Ambulatory Visit (INDEPENDENT_AMBULATORY_CARE_PROVIDER_SITE_OTHER): Payer: Medicare Other

## 2017-05-23 VITALS — BP 112/82 | HR 53 | Resp 16 | Ht 64.0 in | Wt 114.0 lb

## 2017-05-23 VITALS — BP 112/82 | HR 53 | Ht 64.0 in | Wt 114.0 lb

## 2017-05-23 DIAGNOSIS — M1A9XX0 Chronic gout, unspecified, without tophus (tophi): Secondary | ICD-10-CM

## 2017-05-23 DIAGNOSIS — Z0000 Encounter for general adult medical examination without abnormal findings: Secondary | ICD-10-CM | POA: Diagnosis not present

## 2017-05-23 DIAGNOSIS — Z1231 Encounter for screening mammogram for malignant neoplasm of breast: Secondary | ICD-10-CM

## 2017-05-23 DIAGNOSIS — Z8673 Personal history of transient ischemic attack (TIA), and cerebral infarction without residual deficits: Secondary | ICD-10-CM

## 2017-05-23 DIAGNOSIS — E782 Mixed hyperlipidemia: Secondary | ICD-10-CM | POA: Diagnosis not present

## 2017-05-23 DIAGNOSIS — E2839 Other primary ovarian failure: Secondary | ICD-10-CM

## 2017-05-23 LAB — LIPID PANEL
CHOL/HDL RATIO: 2
CHOLESTEROL: 161 mg/dL (ref 0–200)
HDL: 76.5 mg/dL (ref >39.00)
LDL Cholesterol: 73 mg/dL (ref 0–99)
NonHDL: 84.25
TRIGLYCERIDES: 55 mg/dL (ref 0.0–149.0)
VLDL: 11 mg/dL (ref 0.0–40.0)

## 2017-05-23 LAB — CBC
HEMATOCRIT: 36.3 % (ref 36.0–46.0)
HEMOGLOBIN: 12.1 g/dL (ref 12.0–15.0)
MCHC: 33.3 g/dL (ref 30.0–36.0)
MCV: 85.1 fl (ref 78.0–100.0)
PLATELETS: 236 10*3/uL (ref 150.0–400.0)
RBC: 4.26 Mil/uL (ref 3.87–5.11)
RDW: 14.7 % (ref 11.5–15.5)
WBC: 3.7 10*3/uL — AB (ref 4.0–10.5)

## 2017-05-23 LAB — COMPREHENSIVE METABOLIC PANEL
ALBUMIN: 3.6 g/dL (ref 3.5–5.2)
ALK PHOS: 71 U/L (ref 39–117)
ALT: 15 U/L (ref 0–35)
AST: 21 U/L (ref 0–37)
BILIRUBIN TOTAL: 0.7 mg/dL (ref 0.2–1.2)
BUN: 17 mg/dL (ref 6–23)
CALCIUM: 10.9 mg/dL — AB (ref 8.4–10.5)
CO2: 34 mEq/L — ABNORMAL HIGH (ref 19–32)
Chloride: 103 mEq/L (ref 96–112)
Creatinine, Ser: 1.45 mg/dL — ABNORMAL HIGH (ref 0.40–1.20)
GFR: 45.95 mL/min — AB (ref >60.00)
Glucose, Bld: 100 mg/dL — ABNORMAL HIGH (ref 70–99)
Potassium: 3 mEq/L — ABNORMAL LOW (ref 3.5–5.1)
Sodium: 143 mEq/L (ref 135–145)
TOTAL PROTEIN: 7.2 g/dL (ref 6.0–8.3)

## 2017-05-23 LAB — URIC ACID: URIC ACID, SERUM: 5.5 mg/dL (ref 2.4–7.0)

## 2017-05-23 NOTE — Progress Notes (Signed)
   Subjective:    Patient ID: [name], female    DOB: [birth year], 72 y.o.   MRN: [id number]  HPI The patient is a 72 YO female coming in for follow up of her gout (taking allopurinol daily and no more flares since starting, denies pain or swelling or redness, no diet changes), and her cholesterol (taking lipitor 40 mg daily and no side effects).   Review of Systems  Constitutional: Negative.   HENT: Negative.   Eyes: Negative.   Respiratory: Negative for cough, chest tightness and shortness of breath.   Cardiovascular: Negative for chest pain, palpitations and leg swelling.  Gastrointestinal: Negative for abdominal distention, abdominal pain, constipation, diarrhea, nausea and vomiting.  Musculoskeletal: Negative.   Skin: Negative.   Neurological: Negative.   Psychiatric/Behavioral: Negative.       Objective:   Physical Exam  Constitutional: She is oriented to person, place, and time. She appears well-developed and well-nourished.  HENT:  Head: Normocephalic and atraumatic.  Eyes: EOM are normal.  Neck: Normal range of motion.  Cardiovascular: Normal rate and regular rhythm.  Pulmonary/Chest: Effort normal and breath sounds normal. No respiratory distress. She has no wheezes. She has no rales.  Abdominal: Soft. Bowel sounds are normal. She exhibits no distension. There is no tenderness. There is no rebound.  Musculoskeletal: She exhibits no edema.  Neurological: She is alert and oriented to person, place, and time. Coordination normal.  Skin: Skin is warm and dry.  Psychiatric: She has a normal mood and affect.   Vitals:   05/23/17 1004  BP: 112/82  Pulse: (!) 53  SpO2: 99%  Weight: 114 lb (51.7 kg)  Height: 5\' 4"  (1.626 m)      Assessment & Plan:

## 2017-05-23 NOTE — Assessment & Plan Note (Signed)
Checking lipid panel, taking lipitor 40 mg daily and adjust if needed.

## 2017-05-23 NOTE — Patient Instructions (Addendum)
[email or web address] or down load app on smart phone  [employer]unt Berenice Primas website lists multiple social resources for individuals such as: food, health, money, house hold goods, transit, medical supplies, job training and legal services.  Start doing brain stimulating activities (puzzles, reading, adult coloring books, staying active) to keep memory sharp.   Continue to eat heart healthy diet (full of fruits, vegetables, whole grains, lean protein, water--limit salt, fat, and sugar intake) and increase physical activity as tolerate   [name] , Thank you for taking time to come for your Medicare Wellness Visit. I appreciate your ongoing commitment to your health goals. Please review the following plan we discussed and let me know if I can assist you in the future.   These are the goals we discussed: Goals    . Patient Stated     Do chair exercises, start to knit again, and listen to music. Enjoy family and grandchildren.       This is a list of the screening recommended for you and due dates:  Health Maintenance  Topic Date Due  . Tetanus Vaccine  09/03/1966  . Mammogram  09/02/1997  . Colon Cancer Screening  09/02/1997  . DEXA scan (bone density measurement)  09/02/2012  . Flu Shot  08/07/2017  . Pneumonia vaccines (2 of 2 - PPSV23) 04/04/2018  .  Hepatitis C: One time screening is recommended by Center for Disease Control  (CDC) for  adults born from [birth year] through 1963.   Completed   It is important to avoid accidents which may result in broken bones.  Here are a few ideas on how to make your home safer so you will be less likely to trip or fall.  1. Use nonskid mats or non slip strips in your shower or tub, on your bathroom floor and around sinks.  If you know that you have spilled water, wipe it up! 2. In the bathroom, it is important to have properly installed grab bars on the walls or on the edge of the tub.  Towel racks are NOT strong enough for you to hold onto or to pull on for  support. 3. Stairs and hallways should have enough light.  Add lamps or night lights if you need ore light. 4. It is good to have handrails on both sides of the stairs if possible.  Always fix broken handrails right away. 5. It is important to see the edges of steps.  Paint the edges of outdoor steps white so you can see them better.  Put colored tape on the edge of inside steps. 6. Throw-rugs are dangerous because they can slide.  Removing the rugs is the best idea, but if they must stay, add adhesive carpet tape to prevent slipping. 7. Do not keep things on stairs or in the halls.  Remove small furniture that blocks the halls as it may cause you to trip.  Keep telephone and electrical cords out of the way where you walk. 8. Always were sturdy, rubber-soled shoes for good support.  Never wear just socks, especially on the stairs.  Socks may cause you to slip or fall.  Do not wear full-length housecoats as you can easily trip on the bottom.  9. Place the things you use the most on the shelves that are the easiest to reach.  If you use a stepstool, make sure it is in good condition.  If you feel unsteady, DO NOT climb, ask for help. If a health professional advises you to  use a cane or walker, do not be ashamed.  These items can keep you from falling and breaking your bones. Health Maintenance, Female Adopting a healthy lifestyle and getting preventive care can go a long way to promote health and wellness. Talk with your health care provider about what schedule of regular examinations is right for you. This is a good chance for you to check in with your provider about disease prevention and staying healthy. In between checkups, there are plenty of things you can do on your own. Experts have done a lot of research about which lifestyle changes and preventive measures are most likely to keep you healthy. Ask your health care provider for more information. Weight and diet Eat a healthy diet  Be sure to  include plenty of vegetables, fruits, low-fat dairy products, and lean protein.  Do not eat a lot of foods high in solid fats, added sugars, or salt.  Get regular exercise. This is one of the most important things you can do for your health. ? Most adults should exercise for at least 150 minutes each week. The exercise should increase your heart rate and make you sweat (moderate-intensity exercise). ? Most adults should also do strengthening exercises at least twice a week. This is in addition to the moderate-intensity exercise.  Maintain a healthy weight  Body mass index (BMI) is a measurement that can be used to identify possible weight problems. It estimates body fat based on height and weight. Your health care provider can help determine your BMI and help you achieve or maintain a healthy weight.  For females 66 years of age and older: ? A BMI below 18.5 is considered underweight. ? A BMI of 18.5 to 24.9 is normal. ? A BMI of 25 to 29.9 is considered overweight. ? A BMI of 30 and above is considered obese.  Watch levels of cholesterol and blood lipids  You should start having your blood tested for lipids and cholesterol at 70 years of age, then have this test every 5 years.  You may need to have your cholesterol levels checked more often if: ? Your lipid or cholesterol levels are high. ? You are older than 70 years of age. ? You are at high risk for heart disease.  Cancer screening Lung Cancer  Lung cancer screening is recommended for adults 22-37 years old who are at high risk for lung cancer because of a history of smoking.  A yearly low-dose CT scan of the lungs is recommended for people who: ? Currently smoke. ? Have quit within the past 15 years. ? Have at least a 30-pack-year history of smoking. A pack year is smoking an average of one pack of cigarettes a day for 1 year.  Yearly screening should continue until it has been 15 years since you quit.  Yearly screening  should stop if you develop a health problem that would prevent you from having lung cancer treatment.  Breast Cancer  Practice breast self-awareness. This means understanding how your breasts normally appear and feel.  It also means doing regular breast self-exams. Let your health care provider know about any changes, no matter how small.  If you are in your 20s or 30s, you should have a clinical breast exam (CBE) by a health care provider every 1-3 years as part of a regular health exam.  If you are 12 or older, have a CBE every year. Also consider having a breast X-ray (mammogram) every year.  If you have  a family history of breast cancer, talk to your health care provider about genetic screening.  If you are at high risk for breast cancer, talk to your health care provider about having an MRI and a mammogram every year.  Breast cancer gene (BRCA) assessment is recommended for women who have family members with BRCA-related cancers. BRCA-related cancers include: ? Breast. ? Ovarian. ? Tubal. ? Peritoneal cancers.  Results of the assessment will determine the need for genetic counseling and BRCA1 and BRCA2 testing.  Cervical Cancer Your health care provider may recommend that you be screened regularly for cancer of the pelvic organs (ovaries, uterus, and vagina). This screening involves a pelvic examination, including checking for microscopic changes to the surface of your cervix (Pap test). You may be encouraged to have this screening done every 3 years, beginning at age 54.  For women ages 76-65, health care providers may recommend pelvic exams and Pap testing every 3 years, or they may recommend the Pap and pelvic exam, combined with testing for human papilloma virus (HPV), every 5 years. Some types of HPV increase your risk of cervical cancer. Testing for HPV may also be done on women of any age with unclear Pap test results.  Other health care providers may not recommend any  screening for nonpregnant women who are considered low risk for pelvic cancer and who do not have symptoms. Ask your health care provider if a screening pelvic exam is right for you.  If you have had past treatment for cervical cancer or a condition that could lead to cancer, you need Pap tests and screening for cancer for at least 20 years after your treatment. If Pap tests have been discontinued, your risk factors (such as having a new sexual partner) need to be reassessed to determine if screening should resume. Some women have medical problems that increase the chance of getting cervical cancer. In these cases, your health care provider may recommend more frequent screening and Pap tests.  Colorectal Cancer  This type of cancer can be detected and often prevented.  Routine colorectal cancer screening usually begins at 70 years of age and continues through 71 years of age.  Your health care provider may recommend screening at an earlier age if you have risk factors for colon cancer.  Your health care provider may also recommend using home test kits to check for hidden blood in the stool.  A small camera at the end of a tube can be used to examine your colon directly (sigmoidoscopy or colonoscopy). This is done to check for the earliest forms of colorectal cancer.  Routine screening usually begins at age 11.  Direct examination of the colon should be repeated every 5-10 years through 70 years of age. However, you may need to be screened more often if early forms of precancerous polyps or small growths are found.  Skin Cancer  Check your skin from head to toe regularly.  Tell your health care provider about any new moles or changes in moles, especially if there is a change in a mole's shape or color.  Also tell your health care provider if you have a mole that is larger than the size of a pencil eraser.  Always use sunscreen. Apply sunscreen liberally and repeatedly throughout the  day.  Protect yourself by wearing long sleeves, pants, a wide-brimmed hat, and sunglasses whenever you are outside.  Heart disease, diabetes, and high blood pressure  High blood pressure causes heart disease and increases the risk  of stroke. High blood pressure is more likely to develop in: ? People who have blood pressure in the high end of the normal range (130-139/85-89 mm Hg). ? People who are overweight or obese. ? People who are African American.  If you are 73-52 years of age, have your blood pressure checked every 3-5 years. If you are 23 years of age or older, have your blood pressure checked every year. You should have your blood pressure measured twice-once when you are at a hospital or clinic, and once when you are not at a hospital or clinic. Record the average of the two measurements. To check your blood pressure when you are not at a hospital or clinic, you can use: ? An automated blood pressure machine at a pharmacy. ? A home blood pressure monitor.  If you are between 67 years and 85 years old, ask your health care provider if you should take aspirin to prevent strokes.  Have regular diabetes screenings. This involves taking a blood sample to check your fasting blood sugar level. ? If you are at a normal weight and have a low risk for diabetes, have this test once every three years after 70 years of age. ? If you are overweight and have a high risk for diabetes, consider being tested at a younger age or more often. Preventing infection Hepatitis B  If you have a higher risk for hepatitis B, you should be screened for this virus. You are considered at high risk for hepatitis B if: ? You were born in a country where hepatitis B is common. Ask your health care provider which countries are considered high risk. ? Your parents were born in a high-risk country, and you have not been immunized against hepatitis B (hepatitis B vaccine). ? You have HIV or AIDS. ? You use needles to  inject street drugs. ? You live with someone who has hepatitis B. ? You have had sex with someone who has hepatitis B. ? You get hemodialysis treatment. ? You take certain medicines for conditions, including cancer, organ transplantation, and autoimmune conditions.  Hepatitis C  Blood testing is recommended for: ? Everyone born from [birth year] through 1965. ? Anyone with known risk factors for hepatitis C.  Sexually transmitted infections (STIs)  You should be screened for sexually transmitted infections (STIs) including gonorrhea and chlamydia if: ? You are sexually active and are younger than 70 years of age. ? You are older than 70 years of age and your health care provider tells you that you are at risk for this type of infection. ? Your sexual activity has changed since you were last screened and you are at an increased risk for chlamydia or gonorrhea. Ask your health care provider if you are at risk.  If you do not have HIV, but are at risk, it may be recommended that you take a prescription medicine daily to prevent HIV infection. This is called pre-exposure prophylaxis (PrEP). You are considered at risk if: ? You are sexually active and do not regularly use condoms or know the HIV status of your partner(s). ? You take drugs by injection. ? You are sexually active with a partner who has HIV.  Talk with your health care provider about whether you are at high risk of being infected with HIV. If you choose to begin PrEP, you should first be tested for HIV. You should then be tested every 3 months for as long as you are taking PrEP. Pregnancy  If  you are premenopausal and you may become pregnant, ask your health care provider about preconception counseling.  If you may become pregnant, take 400 to 800 micrograms (mcg) of folic acid every day.  If you want to prevent pregnancy, talk to your health care provider about birth control (contraception). Osteoporosis and  menopause  Osteoporosis is a disease in which the bones lose minerals and strength with aging. This can result in serious bone fractures. Your risk for osteoporosis can be identified using a bone density scan.  If you are 62 years of age or older, or if you are at risk for osteoporosis and fractures, ask your health care provider if you should be screened.  Ask your health care provider whether you should take a calcium or vitamin D supplement to lower your risk for osteoporosis.  Menopause may have certain physical symptoms and risks.  Hormone replacement therapy may reduce some of these symptoms and risks. Talk to your health care provider about whether hormone replacement therapy is right for you. Follow these instructions at home:  Schedule regular health, dental, and eye exams.  Stay current with your immunizations.  Do not use any tobacco products including cigarettes, chewing tobacco, or electronic cigarettes.  If you are pregnant, do not drink alcohol.  If you are breastfeeding, limit how much and how often you drink alcohol.  Limit alcohol intake to no more than 1 drink per day for nonpregnant women. One drink equals 12 ounces of beer, 5 ounces of [place], or 1 ounces of hard liquor.  Do not use street drugs.  Do not share needles.  Ask your health care provider for help if you need support or information about quitting drugs.  Tell your health care provider if you often feel depressed.  Tell your health care provider if you have ever been abused or do not feel safe at home. This information is not intended to replace advice given to you by your health care provider. Make sure you discuss any questions you have with your health care provider. Document Released: 07/09/2010 Document Revised: 06/01/2015 Document Reviewed: 09/27/2014 Elsevier Interactive Patient Education  [employer].

## 2017-05-23 NOTE — Progress Notes (Signed)
Medical screening examination/treatment/procedure(s) were performed by non-physician practitioner and as supervising physician I was immediately available for consultation/collaboration. I agree with above. Jashanti Clinkscale A Jaquel Glassburn, MD 

## 2017-05-23 NOTE — Assessment & Plan Note (Signed)
Checking uric acid for goal <6. Continue allopurinol.

## 2017-05-29 ENCOUNTER — Ambulatory Visit (INDEPENDENT_AMBULATORY_CARE_PROVIDER_SITE_OTHER): Payer: Medicare Other | Admitting: Pharmacist Clinician (PhC)/ Clinical Pharmacy Specialist

## 2017-05-29 ENCOUNTER — Other Ambulatory Visit: Payer: Self-pay | Admitting: Pharmacist Clinician (PhC)/ Clinical Pharmacy Specialist

## 2017-05-29 DIAGNOSIS — Z952 Presence of prosthetic heart valve: Secondary | ICD-10-CM

## 2017-05-29 DIAGNOSIS — Z7901 Long term (current) use of anticoagulants: Secondary | ICD-10-CM

## 2017-05-29 DIAGNOSIS — I4891 Unspecified atrial fibrillation: Secondary | ICD-10-CM

## 2017-05-29 DIAGNOSIS — Z953 Presence of xenogenic heart valve: Secondary | ICD-10-CM

## 2017-05-29 LAB — POCT INR: INR: 2.4 (ref 2.0–3.0)

## 2017-05-29 MED ORDER — WARFARIN SODIUM 2 MG PO TABS: ORAL_TABLET | ORAL | 1 refills | Status: DC

## 2017-06-12 ENCOUNTER — Ambulatory Visit (INDEPENDENT_AMBULATORY_CARE_PROVIDER_SITE_OTHER): Payer: Medicare Other | Admitting: Pharmacist Clinician (PhC)/ Clinical Pharmacy Specialist

## 2017-06-12 DIAGNOSIS — Z952 Presence of prosthetic heart valve: Secondary | ICD-10-CM

## 2017-06-12 DIAGNOSIS — I4891 Unspecified atrial fibrillation: Secondary | ICD-10-CM | POA: Diagnosis not present

## 2017-06-12 DIAGNOSIS — Z953 Presence of xenogenic heart valve: Secondary | ICD-10-CM | POA: Diagnosis not present

## 2017-06-12 DIAGNOSIS — Z7901 Long term (current) use of anticoagulants: Secondary | ICD-10-CM | POA: Diagnosis not present

## 2017-06-12 LAB — POCT INR: INR: 2.2 (ref 2.0–3.0)

## 2017-06-24 ENCOUNTER — Ambulatory Visit: Payer: Medicare Other | Admitting: Internal Medicine

## 2017-06-26 ENCOUNTER — Ambulatory Visit (INDEPENDENT_AMBULATORY_CARE_PROVIDER_SITE_OTHER): Payer: Medicare Other | Admitting: Pharmacist Clinician (PhC)/ Clinical Pharmacy Specialist

## 2017-06-26 ENCOUNTER — Ambulatory Visit: Payer: Self-pay | Admitting: Internal Medicine

## 2017-06-26 ENCOUNTER — Ambulatory Visit: Payer: Medicare Other | Admitting: Internal Medicine

## 2017-06-26 DIAGNOSIS — Z952 Presence of prosthetic heart valve: Secondary | ICD-10-CM

## 2017-06-26 DIAGNOSIS — I4891 Unspecified atrial fibrillation: Secondary | ICD-10-CM | POA: Diagnosis not present

## 2017-06-26 DIAGNOSIS — Z7901 Long term (current) use of anticoagulants: Secondary | ICD-10-CM

## 2017-06-26 DIAGNOSIS — Z953 Presence of xenogenic heart valve: Secondary | ICD-10-CM

## 2017-06-26 LAB — POCT INR: INR: 3 (ref 2.0–3.0)

## 2017-06-26 NOTE — Telephone Encounter (Signed)
Daughter, [name] called in for her mother who has dementia.   Her mother slept in one position all night last weekend and woke up with a crick in her neck.   I suggested she try some Tylenol and ice in addition to the home care advise.   She has been using Biofreeze and the stick on heating pads so far.  She has dementia so forgets to move unless someone prompts her or she is repositioned by someone else.   This makes it difficult for her to move around and work the crick out.  Went over home care advice.  Daughter was agreeable to trying these remedies.   Her mother had a 4:00 appt with Dr. Okey Dupre today but cancelled it because she did not want to miss another day of work to bring her in if possible.  I let her know to call us back if her mother did not improve by the middle to end of next week.   Daughter was agreeable with this.   Reason for Disposition . Neck pain  or stiffness    Her mother woke up with a crick in her neck last weekend per daughter.   Mother has dementia and unable to move or reposition without prompting or assistance per daughter.  Answer Assessment - Initial Assessment Questions 1. ONSET: "When did the pain begin?"    Her mother got a  crick in her  neck over the weekend.   She has dementia so can't cooperate.    I have to move her out of the position.  She lays in one position if someone doesn't remind her to move or move her themselves.     Daughter calling in requesting things to try for her mother's neck.   She had a 4:00 appt with Dr. Okey Dupre for today for this but daughter doesn't want to miss another day of work to bring her mother in if she can help it.   2. LOCATION: "Where does it hurt?"      I've used Biofreeze and heating pads that sticks to you.   The main thing is her not moving around to work it out on her own. 3. PATTERN "Does the pain come and go, or has it been constant since it started?"      It's constant as best I can tell since she has  dementia she not very good with communicating. 4. SEVERITY: "How bad is the pain?"  (Scale 1-10; or mild, moderate, severe)   - MILD (1-3): doesn't interfere with normal activities    - MODERATE (4-7): interferes with normal activities or awakens from sleep    - SEVERE (8-10):  excruciating pain, unable to do any normal activities      Not asked 5. RADIATION: "Does the pain go anywhere else, shoot into your arms?"     Not asked 6. CORD SYMPTOMS: "Any weakness or numbness of the arms or legs?"     Not asked the above questions due to her dementia and inability to answer.  Daughter answering as best she can. 7. CAUSE: "What do you think is causing the neck pain?"      "She slept on it wrong last weekend and has a crick  In her neck" per daughter."   "She's unable to work it out because she lays in one place unless prompted or moved due to her dementia". 8. NECK OVERUSE: "Any recent activities that involved turning or twisting the neck?"  No 9. OTHER SYMPTOMS: "Do you have any other symptoms?" (e.g., headache, fever, chest pain, difficulty breathing, neck swelling)     None that the daughter knew of. 10. PREGNANCY: "Is there any chance you are pregnant?" "When was your last menstrual period?"       Not asked due to age  Protocols used: NECK PAIN OR STIFFNESS-A-AH

## 2017-06-26 NOTE — Patient Instructions (Signed)
Description   Continue with 1.5 tablets daily except 1 tablet each Sunday and Thursday.  Repeat INR in 2 weeks     

## 2017-07-03 ENCOUNTER — Ambulatory Visit (INDEPENDENT_AMBULATORY_CARE_PROVIDER_SITE_OTHER): Payer: Medicare Other | Admitting: Internal Medicine

## 2017-07-03 ENCOUNTER — Encounter: Payer: Self-pay | Admitting: Internal Medicine

## 2017-07-03 VITALS — BP 82/60 | HR 59 | Temp 99.3°F | Ht 64.0 in | Wt 115.0 lb

## 2017-07-03 DIAGNOSIS — M542 Cervicalgia: Secondary | ICD-10-CM | POA: Diagnosis not present

## 2017-07-03 DIAGNOSIS — I63411 Cerebral infarction due to embolism of right middle cerebral artery: Secondary | ICD-10-CM

## 2017-07-03 DIAGNOSIS — R350 Frequency of micturition: Secondary | ICD-10-CM | POA: Diagnosis not present

## 2017-07-03 LAB — POCT URINALYSIS DIPSTICK
BILIRUBIN UA: NEGATIVE
GLUCOSE UA: NEGATIVE
Ketones, UA: NEGATIVE
LEUKOCYTES UA: NEGATIVE
Nitrite, UA: NEGATIVE
Protein, UA: NEGATIVE
RBC UA: NEGATIVE
SPEC GRAV UA: 1.015 (ref 1.010–1.025)
Urobilinogen, UA: 1 E.U./dL
pH, UA: 6.5 (ref 5.0–8.0)

## 2017-07-03 MED ORDER — PREDNISONE 20 MG PO TABS: 40.0000 mg | ORAL_TABLET | Freq: Every day | ORAL | 0 refills | Status: DC

## 2017-07-03 MED ORDER — CYCLOBENZAPRINE HCL 5 MG PO TABS: 5.0000 mg | ORAL_TABLET | Freq: Two times a day (BID) | ORAL | 1 refills | Status: DC | PRN

## 2017-07-03 NOTE — Patient Instructions (Addendum)
We have sent in prednisone to take 2 pills daily for 5 days to help the neck pain. This is likely a muscle strain and could take up to 4 weeks to heal all the way.  We have sent in a muscle relaxer called flexeril (cyclobenzaprine) that you can try at night time or if you are home with her up to twice a day for pain.   It is also okay to take tylenol for pain if needed.   We have checked the urine today and you do not have a urine infection.

## 2017-07-03 NOTE — Progress Notes (Signed)
   Subjective:    Patient ID: [name], female    DOB: 1945-10-01, 72 y.o.   MRN: [id number]  HPI The patient is a 72 YO female coming in for neck pain and stiffness. Started after she was spending a few days with family members who are much more active than she is. They may have been doing different activities. She cannot recall and has memory impairment. Her daughter helps to provide history. Her neck is stiff and painful and she is now requiring help to turn over in bed to get up. This is 8/10 pain when bad and about 3/10 now. She is taking tylenol a couple of times which has helped some. She cannot remember to take it during the day. She denies numbness in her hands. Her daughter reports she is not dropping anything lately. Did also fall in the last 2 weeks.    Review of Systems  Unable to perform ROS: Dementia  Constitutional: Positive for activity change. Negative for appetite change, chills, fatigue, fever and unexpected weight change.  Respiratory: Negative.   Cardiovascular: Negative.   Gastrointestinal: Negative.   Musculoskeletal: Positive for arthralgias, myalgias, neck pain and neck stiffness. Negative for back pain, gait problem and joint swelling.  Skin: Negative.   Neurological: Negative.       Objective:   Physical Exam  Constitutional: She is oriented to person, place, and time. She appears well-developed and well-nourished.  HENT:  Head: Normocephalic and atraumatic.  Eyes: EOM are normal.  Neck: Normal range of motion.  Cardiovascular: Normal rate and regular rhythm.  Pulmonary/Chest: Effort normal and breath sounds normal. No respiratory distress. She has no wheezes. She has no rales.  Abdominal: Soft. Bowel sounds are normal. She exhibits no distension. There is no tenderness. There is no rebound.  Musculoskeletal: She exhibits tenderness. She exhibits no edema.  Pain in the muscular region of the left neck more than right, no pain in the shoulder or  scapular region  Neurological: She is alert and oriented to person, place, and time. Coordination normal.  Skin: Skin is warm and dry.  Psychiatric: She has a normal mood and affect.   Vitals:   07/03/17 0949  BP: (!) 82/60  Pulse: (!) 59  Temp: 99.3 F (37.4 C)  TempSrc: Oral  SpO2: 98%  Weight: 115 lb (52.2 kg)  Height: 5\' 4"  (1.626 m)      Assessment & Plan:

## 2017-07-05 DIAGNOSIS — M542 Cervicalgia: Secondary | ICD-10-CM | POA: Insufficient documentation

## 2017-07-05 NOTE — Assessment & Plan Note (Signed)
Given the dementia and lack of knowledge about injury it is likely muscular strain. Rx for prednisone to take. Rx for muscle relaxer to take night time only with supervision. POC u/a done in the office given 99 temp and concern for infection which is clear.

## 2017-07-07 ENCOUNTER — Other Ambulatory Visit: Payer: Self-pay | Admitting: Internal Medicine

## 2017-07-09 ENCOUNTER — Ambulatory Visit (INDEPENDENT_AMBULATORY_CARE_PROVIDER_SITE_OTHER): Payer: Medicare Other | Admitting: Pharmacist

## 2017-07-09 DIAGNOSIS — I4891 Unspecified atrial fibrillation: Secondary | ICD-10-CM | POA: Diagnosis not present

## 2017-07-09 DIAGNOSIS — Z7901 Long term (current) use of anticoagulants: Secondary | ICD-10-CM | POA: Diagnosis not present

## 2017-07-09 DIAGNOSIS — Z953 Presence of xenogenic heart valve: Secondary | ICD-10-CM

## 2017-07-09 DIAGNOSIS — Z952 Presence of prosthetic heart valve: Secondary | ICD-10-CM

## 2017-07-09 LAB — POCT INR: INR: 3.7 — AB (ref 2.0–3.0)

## 2017-07-17 IMAGING — CT CT HEAD W/O CM
1 series · 16 of 30 positions shown, 20 images · non-contrast
Comparison: MRI 01/17/2007

CLINICAL DATA: Memory loss, altered mental status, dizziness

EXAM:
CT HEAD WITHOUT CONTRAST
TECHNIQUE: Contiguous axial images were obtained from the base of the skull
through the vertex without intravenous contrast.

[Series 2: headseq 4.8 h45s · axial · 0.42mm/px · z∈[+1239,+1370]mm · 16 of 30 slices shown, 20 images]
[im 2/30  brain]
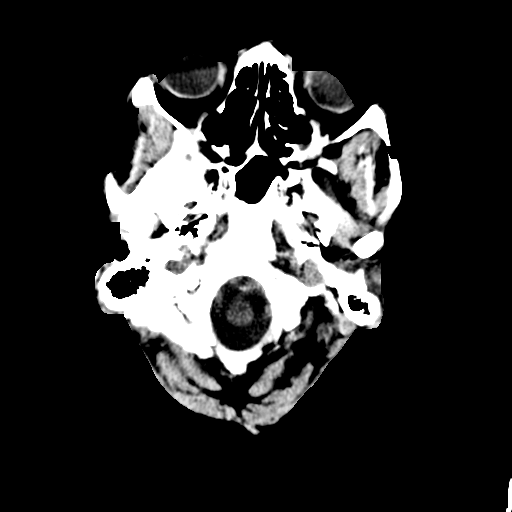
[im 2/30  bone]
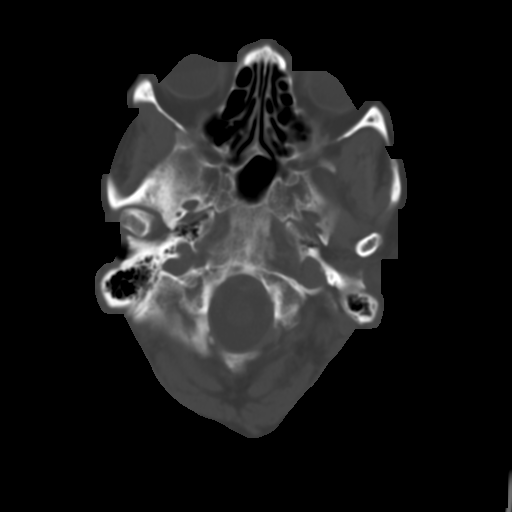
[im 4/30  brain]
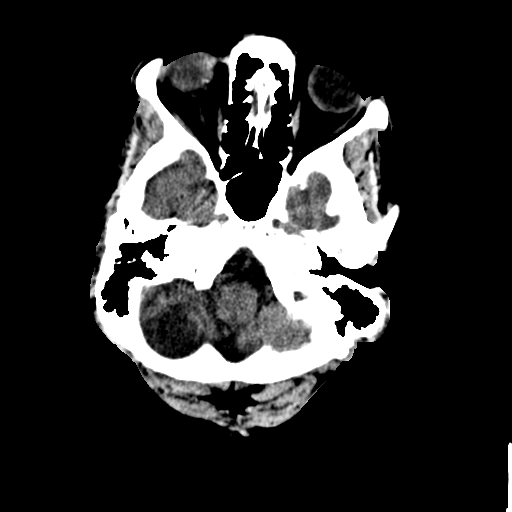
[im 6/30  brain]
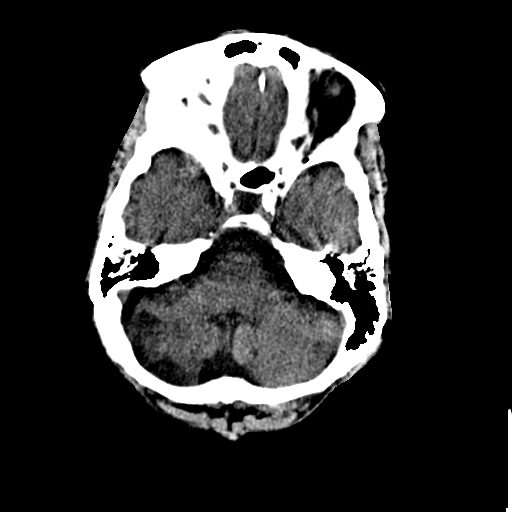
[im 8/30  brain]
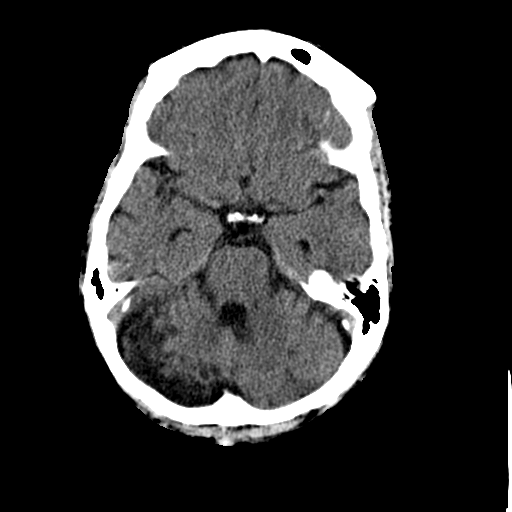
[im 9/30  brain]
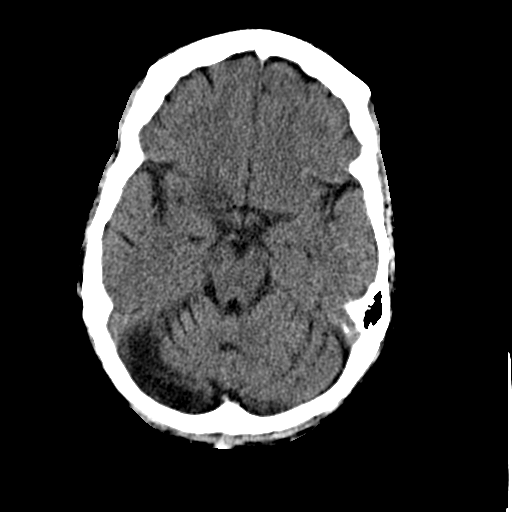
[im 9/30  bone]
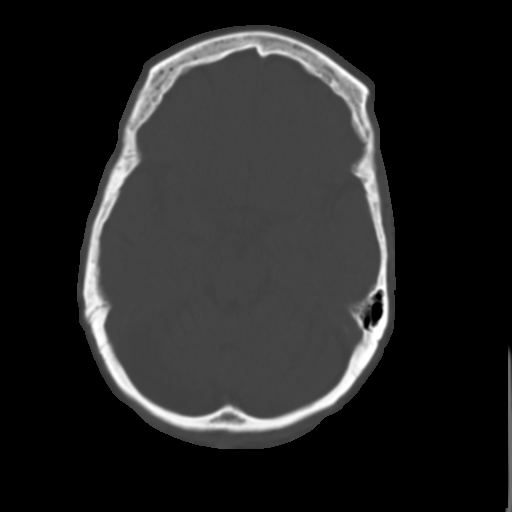
[im 11/30  brain]
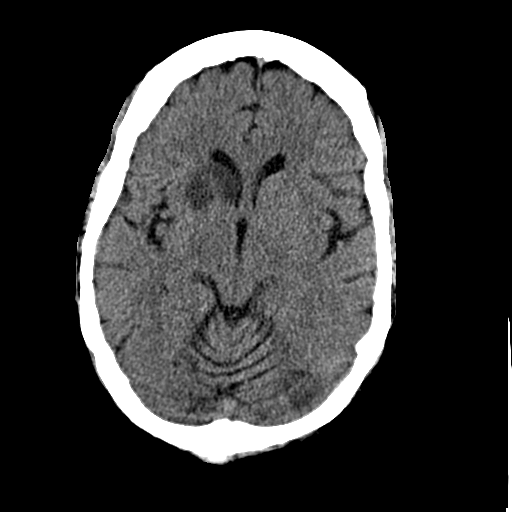
[im 13/30  brain]
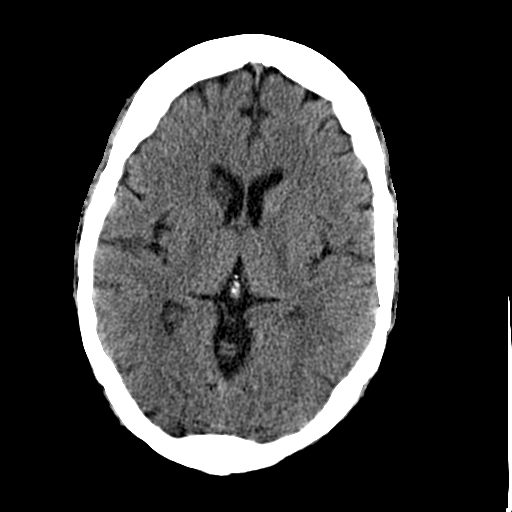
[im 15/30  brain]
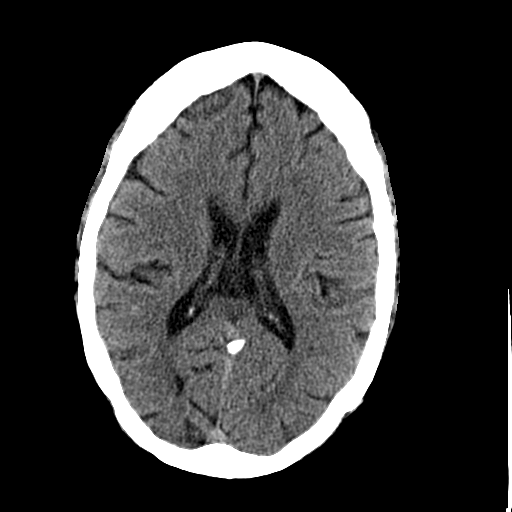
[im 16/30  brain]
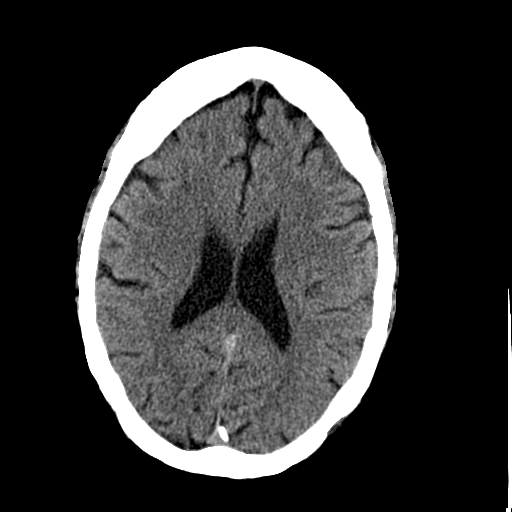
[im 16/30  bone]
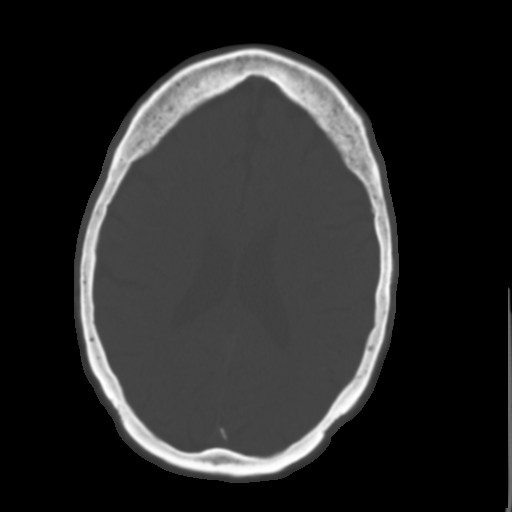
[im 18/30  brain]
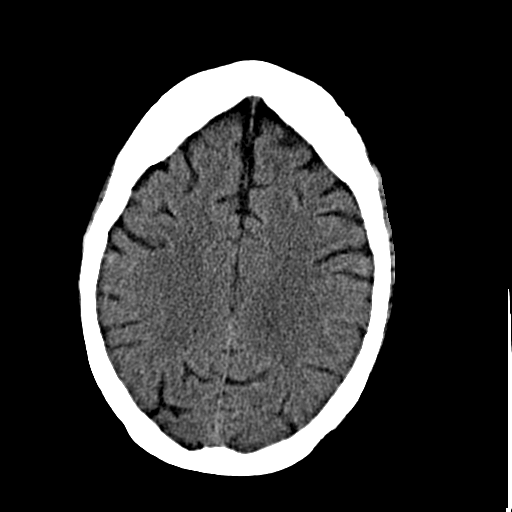
[im 20/30  brain]
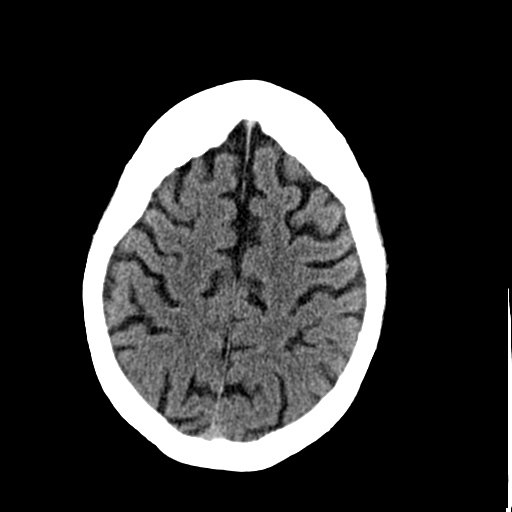
[im 22/30  brain]
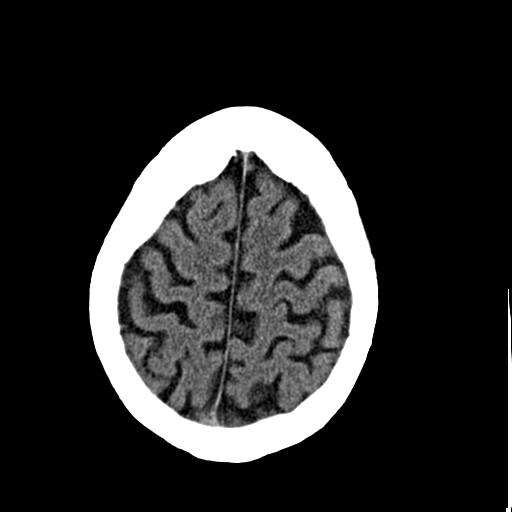
[im 23/30  brain]
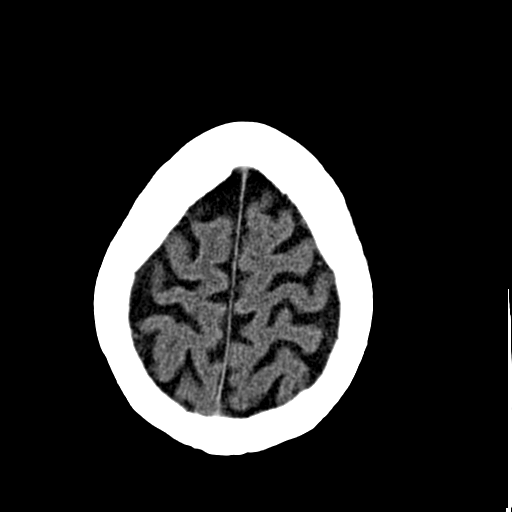
[im 23/30  bone]
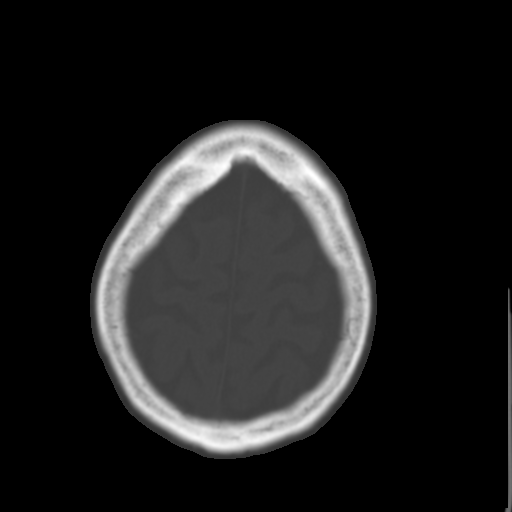
[im 25/30  brain]
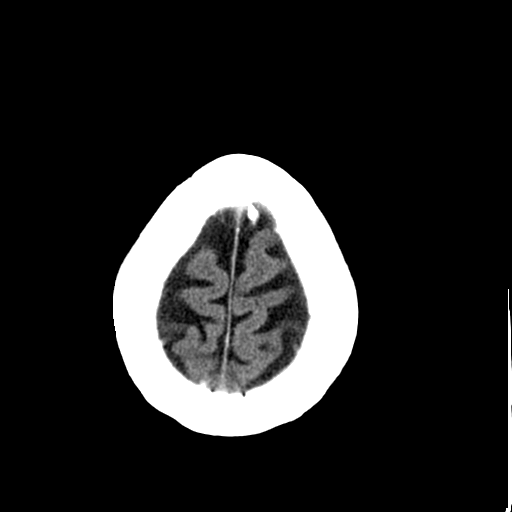
[im 27/30  brain]
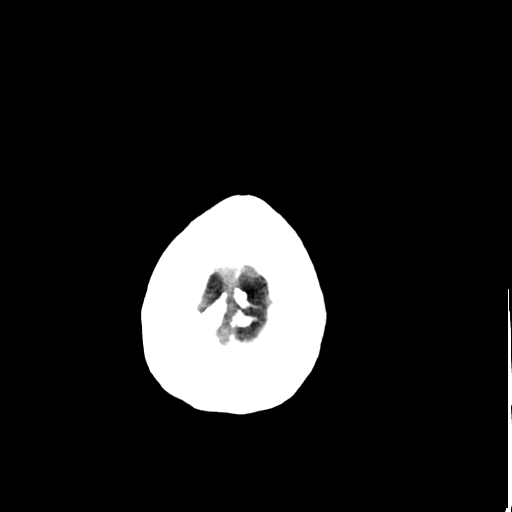
[im 29/30  brain]
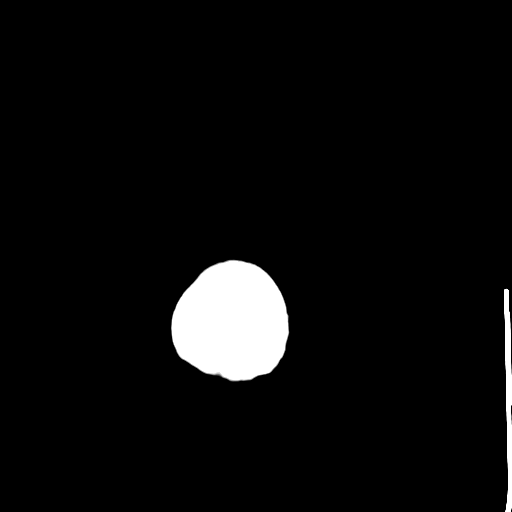

[16 of 30 positions shown; findings below may reference images not displayed]

FINDINGS: Encephalomalacia within the right cerebellar hemisphere from old
infarct. There is low-density within the right basal ganglia which
is compatible with infarct, age indeterminate a concerning for acute
to subacute infarct. No hemorrhage. No hydrocephalus or midline
shift. No acute calvarial abnormality.
IMPRESSION: Low-density within the right basal ganglia compatible with
age-indeterminate infarct. This is suspicious for acute to subacute
infarct.

Old right cerebellar infarct and encephalomalacia.

## 2017-07-21 ENCOUNTER — Other Ambulatory Visit (HOSPITAL_COMMUNITY): Payer: Self-pay | Admitting: Internal Medicine

## 2017-07-24 ENCOUNTER — Ambulatory Visit (INDEPENDENT_AMBULATORY_CARE_PROVIDER_SITE_OTHER): Payer: Medicare Other | Admitting: Pharmacist

## 2017-07-24 DIAGNOSIS — I4891 Unspecified atrial fibrillation: Secondary | ICD-10-CM | POA: Diagnosis not present

## 2017-07-24 DIAGNOSIS — Z7901 Long term (current) use of anticoagulants: Secondary | ICD-10-CM

## 2017-07-24 DIAGNOSIS — Z953 Presence of xenogenic heart valve: Secondary | ICD-10-CM

## 2017-07-24 LAB — POCT INR: INR: 4.2 — AB (ref 2.0–3.0)

## 2017-08-01 ENCOUNTER — Ambulatory Visit (INDEPENDENT_AMBULATORY_CARE_PROVIDER_SITE_OTHER): Payer: Medicare Other | Admitting: Pharmacist

## 2017-08-01 DIAGNOSIS — Z953 Presence of xenogenic heart valve: Secondary | ICD-10-CM | POA: Diagnosis not present

## 2017-08-01 DIAGNOSIS — I4891 Unspecified atrial fibrillation: Secondary | ICD-10-CM

## 2017-08-01 DIAGNOSIS — Z952 Presence of prosthetic heart valve: Secondary | ICD-10-CM

## 2017-08-01 DIAGNOSIS — Z7901 Long term (current) use of anticoagulants: Secondary | ICD-10-CM | POA: Diagnosis not present

## 2017-08-01 LAB — POCT INR: INR: 2.4 (ref 2.0–3.0)

## 2017-08-14 ENCOUNTER — Ambulatory Visit (INDEPENDENT_AMBULATORY_CARE_PROVIDER_SITE_OTHER): Payer: Medicare Other | Admitting: Pharmacist Clinician (PhC)/ Clinical Pharmacy Specialist

## 2017-08-14 DIAGNOSIS — Z953 Presence of xenogenic heart valve: Secondary | ICD-10-CM | POA: Diagnosis not present

## 2017-08-14 DIAGNOSIS — I4891 Unspecified atrial fibrillation: Secondary | ICD-10-CM | POA: Diagnosis not present

## 2017-08-14 DIAGNOSIS — Z7901 Long term (current) use of anticoagulants: Secondary | ICD-10-CM

## 2017-08-14 DIAGNOSIS — Z952 Presence of prosthetic heart valve: Secondary | ICD-10-CM

## 2017-08-14 LAB — POCT INR: INR: 2.7 (ref 2.0–3.0)

## 2017-09-04 ENCOUNTER — Ambulatory Visit (INDEPENDENT_AMBULATORY_CARE_PROVIDER_SITE_OTHER): Payer: Medicare Other | Admitting: Pharmacist Clinician (PhC)/ Clinical Pharmacy Specialist

## 2017-09-04 DIAGNOSIS — Z953 Presence of xenogenic heart valve: Secondary | ICD-10-CM

## 2017-09-04 DIAGNOSIS — I4891 Unspecified atrial fibrillation: Secondary | ICD-10-CM

## 2017-09-04 DIAGNOSIS — Z952 Presence of prosthetic heart valve: Secondary | ICD-10-CM

## 2017-09-04 DIAGNOSIS — I63411 Cerebral infarction due to embolism of right middle cerebral artery: Secondary | ICD-10-CM

## 2017-09-04 DIAGNOSIS — Z7901 Long term (current) use of anticoagulants: Secondary | ICD-10-CM | POA: Diagnosis not present

## 2017-09-04 LAB — POCT INR: INR: 2.6 (ref 2.0–3.0)

## 2017-09-18 ENCOUNTER — Ambulatory Visit (INDEPENDENT_AMBULATORY_CARE_PROVIDER_SITE_OTHER): Payer: Medicare Other | Admitting: Pharmacist Clinician (PhC)/ Clinical Pharmacy Specialist

## 2017-09-18 DIAGNOSIS — I4891 Unspecified atrial fibrillation: Secondary | ICD-10-CM

## 2017-09-18 DIAGNOSIS — Z952 Presence of prosthetic heart valve: Secondary | ICD-10-CM

## 2017-09-18 DIAGNOSIS — Z953 Presence of xenogenic heart valve: Secondary | ICD-10-CM

## 2017-09-18 DIAGNOSIS — Z7901 Long term (current) use of anticoagulants: Secondary | ICD-10-CM

## 2017-09-18 LAB — POCT INR: INR: 2.2 (ref 2.0–3.0)

## 2017-09-19 ENCOUNTER — Ambulatory Visit: Payer: Medicare Other | Admitting: Neurology

## 2017-10-02 ENCOUNTER — Ambulatory Visit (INDEPENDENT_AMBULATORY_CARE_PROVIDER_SITE_OTHER): Payer: Medicare Other | Admitting: Pharmacist

## 2017-10-02 DIAGNOSIS — Z953 Presence of xenogenic heart valve: Secondary | ICD-10-CM

## 2017-10-02 DIAGNOSIS — Z7901 Long term (current) use of anticoagulants: Secondary | ICD-10-CM

## 2017-10-02 DIAGNOSIS — I4891 Unspecified atrial fibrillation: Secondary | ICD-10-CM

## 2017-10-02 DIAGNOSIS — Z952 Presence of prosthetic heart valve: Secondary | ICD-10-CM

## 2017-10-02 LAB — POCT INR: INR: 2 (ref 2.0–3.0)

## 2017-10-15 ENCOUNTER — Other Ambulatory Visit: Payer: Self-pay | Admitting: Internal Medicine

## 2017-10-16 ENCOUNTER — Ambulatory Visit (INDEPENDENT_AMBULATORY_CARE_PROVIDER_SITE_OTHER): Payer: Medicare Other | Admitting: Pharmacist

## 2017-10-16 DIAGNOSIS — Z7901 Long term (current) use of anticoagulants: Secondary | ICD-10-CM

## 2017-10-16 DIAGNOSIS — I4891 Unspecified atrial fibrillation: Secondary | ICD-10-CM

## 2017-10-16 DIAGNOSIS — Z953 Presence of xenogenic heart valve: Secondary | ICD-10-CM | POA: Diagnosis not present

## 2017-10-16 DIAGNOSIS — Z952 Presence of prosthetic heart valve: Secondary | ICD-10-CM

## 2017-10-16 LAB — POCT INR: INR: 2.5 (ref 2.0–3.0)

## 2017-10-30 ENCOUNTER — Ambulatory Visit (INDEPENDENT_AMBULATORY_CARE_PROVIDER_SITE_OTHER): Payer: Medicare Other | Admitting: Pharmacist Clinician (PhC)/ Clinical Pharmacy Specialist

## 2017-10-30 DIAGNOSIS — Z953 Presence of xenogenic heart valve: Secondary | ICD-10-CM

## 2017-10-30 DIAGNOSIS — Z7901 Long term (current) use of anticoagulants: Secondary | ICD-10-CM

## 2017-10-30 DIAGNOSIS — I4891 Unspecified atrial fibrillation: Secondary | ICD-10-CM | POA: Diagnosis not present

## 2017-10-30 DIAGNOSIS — Z952 Presence of prosthetic heart valve: Secondary | ICD-10-CM

## 2017-10-30 LAB — POCT INR: INR: 2.1 (ref 2.0–3.0)

## 2017-11-17 ENCOUNTER — Other Ambulatory Visit: Payer: Self-pay | Admitting: Internal Medicine

## 2017-11-20 ENCOUNTER — Ambulatory Visit (INDEPENDENT_AMBULATORY_CARE_PROVIDER_SITE_OTHER): Payer: Medicare Other | Admitting: Pharmacist Clinician (PhC)/ Clinical Pharmacy Specialist

## 2017-11-20 DIAGNOSIS — Z953 Presence of xenogenic heart valve: Secondary | ICD-10-CM | POA: Diagnosis not present

## 2017-11-20 DIAGNOSIS — Z7901 Long term (current) use of anticoagulants: Secondary | ICD-10-CM

## 2017-11-20 DIAGNOSIS — I4891 Unspecified atrial fibrillation: Secondary | ICD-10-CM

## 2017-11-20 DIAGNOSIS — Z952 Presence of prosthetic heart valve: Secondary | ICD-10-CM

## 2017-11-20 LAB — POCT INR: INR: 2.4 (ref 2.0–3.0)

## 2017-11-20 NOTE — Patient Instructions (Signed)
Description   Continue taking 1.5 tablets daily except 1 tablet each Monday, Wednesday and Friday.  Repeat INR in 3 week     

## 2017-11-21 ENCOUNTER — Ambulatory Visit: Payer: Self-pay | Admitting: *Deleted

## 2017-11-21 MED ORDER — PREDNISONE 20 MG PO TABS: 40.0000 mg | ORAL_TABLET | Freq: Every day | ORAL | 0 refills | Status: DC

## 2017-11-21 NOTE — Addendum Note (Signed)
Addended by: Hillard Danker A on: 11/21/2017 02:54 PM   Modules accepted: Orders

## 2017-11-21 NOTE — Telephone Encounter (Signed)
Daughter informed that prednisone was sent in and stated understanding. Also stated that the next flare up they will come make a visit

## 2017-11-21 NOTE — Telephone Encounter (Signed)
Have sent in prednisone to pharmacy.

## 2017-11-21 NOTE — Telephone Encounter (Signed)
Contacted pt's daughter [name] regarding symptoms; she states that the pt has been eating a lot of beef; this morning the pt complained of questionable right foot pain; her daughter also reports swelling; she explained that the pt is normally given prednisone when she has a gout flair up; she states that the pt took one dose of prednisone that she had at home thus far; discussed allopurinol that was prescribed on 11/17/17, and she says that the pt has been taking it nightly; she would like to know if Dr Okey Dupre wants the pt to have additional prednisone; offered to schedule acute appointment for the pt; however [name] would like for Dr Okey Dupre make recommendations before an appointment is scheduled; explained to pt's daughter that this information will be sent for provider review, however the call may not be returned today because of the provider's pt load; [name] can be contacted at [phone]; will route to office for final disposition.

## 2017-11-22 ENCOUNTER — Ambulatory Visit: Payer: Medicare Other | Admitting: Family Medicine

## 2017-12-08 ENCOUNTER — Telehealth: Payer: Self-pay

## 2017-12-08 NOTE — Telephone Encounter (Signed)
Appointment changed

## 2017-12-08 NOTE — Telephone Encounter (Signed)
Called to reschedule appt canceled the 12/5 appt 8am wants to know if they can come just a bit earlier or be worked in later that day

## 2017-12-16 ENCOUNTER — Ambulatory Visit (INDEPENDENT_AMBULATORY_CARE_PROVIDER_SITE_OTHER): Payer: Medicare Other | Admitting: Pharmacist Clinician (PhC)/ Clinical Pharmacy Specialist

## 2017-12-16 DIAGNOSIS — Z7901 Long term (current) use of anticoagulants: Secondary | ICD-10-CM | POA: Diagnosis not present

## 2017-12-16 DIAGNOSIS — Z952 Presence of prosthetic heart valve: Secondary | ICD-10-CM

## 2017-12-16 DIAGNOSIS — Z953 Presence of xenogenic heart valve: Secondary | ICD-10-CM

## 2017-12-16 DIAGNOSIS — I4891 Unspecified atrial fibrillation: Secondary | ICD-10-CM

## 2017-12-16 LAB — POCT INR: INR: 2 (ref 2.0–3.0)

## 2017-12-22 ENCOUNTER — Encounter: Payer: Self-pay | Admitting: Internal Medicine

## 2017-12-22 ENCOUNTER — Ambulatory Visit (INDEPENDENT_AMBULATORY_CARE_PROVIDER_SITE_OTHER): Payer: Medicare Other | Admitting: Internal Medicine

## 2017-12-22 ENCOUNTER — Ambulatory Visit: Payer: Self-pay

## 2017-12-22 ENCOUNTER — Other Ambulatory Visit: Payer: Self-pay | Admitting: Internal Medicine

## 2017-12-22 VITALS — BP 160/70 | HR 60 | Temp 98.3°F | Ht 64.0 in | Wt 124.0 lb

## 2017-12-22 DIAGNOSIS — I63411 Cerebral infarction due to embolism of right middle cerebral artery: Secondary | ICD-10-CM | POA: Diagnosis not present

## 2017-12-22 DIAGNOSIS — M1A0310 Idiopathic chronic gout, right wrist, without tophus (tophi): Secondary | ICD-10-CM

## 2017-12-22 MED ORDER — PREDNISONE 20 MG PO TABS: 40.0000 mg | ORAL_TABLET | Freq: Every day | ORAL | 0 refills | Status: DC

## 2017-12-22 NOTE — Patient Instructions (Signed)
We will have take prednisone 2 pills daily for 1 week.   We will have you come back in about 1 month anytime for labs to check the gout levels.

## 2017-12-22 NOTE — Progress Notes (Signed)
   Subjective:    Patient ID: [name], female    DOB: 04-20-1945, 72 y.o.   MRN: [id number]  HPI The patient is a 72 YO female coming in for left hand pain. Feels similar to prior episodes of gout. Denies missing allopurinol. Denies falls or injury known. Started this morning. Some swelling in the hand and very tender to touch. Some change in diet for holidays recently. Has tried tylenol without relief.   Review of Systems  Constitutional: Positive for activity change. Negative for appetite change, chills, fatigue, fever and unexpected weight change.  Respiratory: Negative.   Cardiovascular: Negative.   Gastrointestinal: Negative.   Musculoskeletal: Positive for arthralgias, joint swelling and myalgias. Negative for back pain and gait problem.  Skin: Negative.   Neurological: Negative.       Objective:   Physical Exam Constitutional:      Appearance: She is well-developed.  HENT:     Head: Normocephalic and atraumatic.  Neck:     Musculoskeletal: Normal range of motion.  Cardiovascular:     Rate and Rhythm: Normal rate and regular rhythm.  Pulmonary:     Effort: Pulmonary effort is normal. No respiratory distress.     Breath sounds: Normal breath sounds. No wheezing or rales.  Abdominal:     General: There is no distension.     Palpations: Abdomen is soft.     Tenderness: There is no abdominal tenderness. There is no rebound.  Musculoskeletal:        General: Swelling and tenderness present.     Comments: Left hand with swelling in the digits and wrist and pain in the wrist, no pain with ROM of elbow or fingers. Pain with ROM wrist.   Skin:    General: Skin is warm and dry.  Neurological:     Mental Status: She is alert and oriented to person, place, and time.     Coordination: Coordination normal.    Vitals:   12/22/17 1358  BP: (!) 160/70  Pulse: 60  Temp: 98.3 F (36.8 C)  TempSrc: Oral  SpO2: 97%  Weight: 124 lb (56.2 kg)  Height: 5\' 4"  (1.626  m)      Assessment & Plan:

## 2017-12-22 NOTE — Telephone Encounter (Signed)
appt made for today 

## 2017-12-22 NOTE — Telephone Encounter (Signed)
Pt's daughter called stating pt is having moderate left hand pain and wrist pain that started this morning. Pt cannot make a fist due to the pain. Daughter suspects gout is causing her symptoms. Daughter stated she fed her mother a beef link sausage sandwich yesterday and stated she thought that is what is causing her mother to have pain this morning. Denies neck pain, swelling, rash, numbness or fever.  Daughter stated that her mother has not been taking anything for pain. Offered to make an appointment for today to see provider. Daughter stated that she was at work and could not take her to the appointment today. Appt made for tomorrow morning. Daughter is asking if something could be called in to the pharmacy for her mother's pain before the appointment and does she "really need to come in to be seen." Care advice given and daughter verbalized understanding.  Reason for Disposition . Weakness (i.e., loss of strength) of new onset in hand or fingers  (Exceptions: not truly weak, hand feels weak because of pain; weakness present > 2 weeks)  Answer Assessment - Initial Assessment Questions 1. ONSET: "When did the pain start?"     This morning 2. LOCATION: "Where is the pain located?"     Left hand and wrist - cannot make a fist 3. PAIN: "How bad is the pain?" (Scale 1-10; or mild, moderate, severe)   - MILD (1-3): doesn't interfere with normal activities   - MODERATE (4-7): interferes with normal activities (e.g., work or school) or awakens from sleep   - SEVERE (8-10): excruciating pain, unable to use hand at all     Moderate  4. WORK OR EXERCISE: "Has there been any recent work or exercise that involved this part of the body?"     no 5. CAUSE: "What do you think is causing the pain?"     gout 6. AGGRAVATING FACTORS: "What makes the pain worse?" (e.g., using computer)     Moving hand 7. OTHER SYMPTOMS: "Do you have any other symptoms?" (e.g., neck pain, swelling, rash, numbness, fever)  no 8. PREGNANCY: "Is there any chance you are pregnant?" "When was your last menstrual period?"     n/a  Protocols used: HAND AND WRIST PAIN-A-AH

## 2017-12-22 NOTE — Telephone Encounter (Signed)
noted 

## 2017-12-22 NOTE — Assessment & Plan Note (Signed)
Rx for prednisone and uric acid check in 1 month. Adjust allopurinol 100 mg daily if needed. Last uric acid 5.5 which is acceptable.

## 2017-12-23 ENCOUNTER — Ambulatory Visit: Payer: Medicare Other | Admitting: Internal Medicine

## 2017-12-24 NOTE — Telephone Encounter (Signed)
Called and spoke with the patients daughter Tharon Aquas and she verified her mothers DOB. Daughter was informed that the refills was sent to [facility] pharmacy on [address]. Daughter verbalized understanding

## 2017-12-24 NOTE — Telephone Encounter (Signed)
New Message        Patient's daughter is calling to check the status on her mother's called in refill. Pls call and advise.

## 2018-01-02 ENCOUNTER — Ambulatory Visit: Payer: Self-pay

## 2018-01-02 ENCOUNTER — Ambulatory Visit: Payer: Self-pay | Admitting: *Deleted

## 2018-01-02 MED ORDER — PREDNISONE 20 MG PO TABS: 40.0000 mg | ORAL_TABLET | Freq: Every day | ORAL | 0 refills | Status: DC

## 2018-01-02 NOTE — Addendum Note (Signed)
Addended by: Myrlene Broker on: 01/02/2018 08:37 AM   Modules accepted: Orders

## 2018-01-02 NOTE — Telephone Encounter (Signed)
Sent in prednisone

## 2018-01-02 NOTE — Telephone Encounter (Signed)
Patient was seen 2 weeks ago- she is having another flare. Same hand and wrist. Patient is still using Allopurinol. Daughter is calling to get treatment to help with flare.  Reason for Disposition . Caller requesting a NON-URGENT new prescription or refill and triager unable to refill per unit policy  Answer Assessment - Initial Assessment Questions 1. ONSET: "When did the pain start?"     Noticed pain yesterday 2. LOCATION: "Where is the pain located?"     Left wrist/hand 3. PAIN: "How bad is the pain?" (Scale 1-10; or mild, moderate, severe)   - MILD (1-3): doesn't interfere with normal activities   - MODERATE (4-7): interferes with normal activities (e.g., work or school) or awakens from sleep   - SEVERE (8-10): excruciating pain, unable to use hand at all     Moderate/severe 4. WORK OR EXERCISE: "Has there been any recent work or exercise that involved this part of the body?"     no 5. CAUSE: "What do you think is causing the pain?"     Gout flare 6. AGGRAVATING FACTORS: "What makes the pain worse?" (e.g., using computer)     no 7. OTHER SYMPTOMS: "Do you have any other symptoms?" (e.g., neck pain, swelling, rash, numbness, fever)     no 8. PREGNANCY: "Is there any chance you are pregnant?" "When was your last menstrual period?"     n/a  Answer Assessment - Initial Assessment Questions 1. SYMPTOMS: "Do you have any symptoms?"     Hand/wrist pain 2. SEVERITY: If symptoms are present, ask "Are they mild, moderate or severe?"     Moderate/severe  Patient's daughter is requesting medication- she states she was just seen 2 weeks ago for this.  Protocols used: MEDICATION QUESTION CALL-A-AH, HAND AND WRIST PAIN-A-AH

## 2018-01-02 NOTE — Telephone Encounter (Signed)
See other triage note.

## 2018-01-02 NOTE — Telephone Encounter (Signed)
Yes, labs in 1 month or so to check levels. This was placed at prior visit.

## 2018-01-02 NOTE — Telephone Encounter (Signed)
Daughter informed of MD response stated understanding

## 2018-01-02 NOTE — Telephone Encounter (Signed)
Pt's daughter has been informed. However, she wants to know if [name] needs to come back for lab work in a month to check her level to know if the allopurinol needs to be adjusted or not. I informed her I will check and get back with her.

## 2018-01-05 ENCOUNTER — Ambulatory Visit (INDEPENDENT_AMBULATORY_CARE_PROVIDER_SITE_OTHER): Payer: Medicare Other | Admitting: Pharmacist Clinician (PhC)/ Clinical Pharmacy Specialist

## 2018-01-05 DIAGNOSIS — Z953 Presence of xenogenic heart valve: Secondary | ICD-10-CM

## 2018-01-05 DIAGNOSIS — Z7901 Long term (current) use of anticoagulants: Secondary | ICD-10-CM

## 2018-01-05 DIAGNOSIS — Z952 Presence of prosthetic heart valve: Secondary | ICD-10-CM

## 2018-01-05 DIAGNOSIS — I4891 Unspecified atrial fibrillation: Secondary | ICD-10-CM

## 2018-01-05 LAB — POCT INR: INR: 2.6 (ref 2.0–3.0)

## 2018-01-05 NOTE — Patient Instructions (Signed)
Description   Continue taking 1.5 tablets daily except 1 tablet each Monday, Wednesday and Friday.  Repeat INR in 3 week

## 2018-01-21 ENCOUNTER — Ambulatory Visit (INDEPENDENT_AMBULATORY_CARE_PROVIDER_SITE_OTHER): Payer: Medicare Other | Admitting: Pharmacist

## 2018-01-21 DIAGNOSIS — Z953 Presence of xenogenic heart valve: Secondary | ICD-10-CM

## 2018-01-21 DIAGNOSIS — Z7901 Long term (current) use of anticoagulants: Secondary | ICD-10-CM | POA: Diagnosis not present

## 2018-01-21 DIAGNOSIS — Z952 Presence of prosthetic heart valve: Secondary | ICD-10-CM

## 2018-01-21 DIAGNOSIS — I4891 Unspecified atrial fibrillation: Secondary | ICD-10-CM

## 2018-01-21 LAB — POCT INR: INR: 2.6 (ref 2.0–3.0)

## 2018-01-23 ENCOUNTER — Other Ambulatory Visit: Payer: Self-pay | Admitting: Internal Medicine

## 2018-02-09 ENCOUNTER — Other Ambulatory Visit: Payer: Self-pay | Admitting: Internal Medicine

## 2018-02-09 NOTE — Telephone Encounter (Signed)
Rx request sent to pharmacy.  

## 2018-02-11 ENCOUNTER — Ambulatory Visit (INDEPENDENT_AMBULATORY_CARE_PROVIDER_SITE_OTHER): Payer: Medicare Other | Admitting: Pharmacist

## 2018-02-11 DIAGNOSIS — Z953 Presence of xenogenic heart valve: Secondary | ICD-10-CM

## 2018-02-11 DIAGNOSIS — Z952 Presence of prosthetic heart valve: Secondary | ICD-10-CM

## 2018-02-11 DIAGNOSIS — I4891 Unspecified atrial fibrillation: Secondary | ICD-10-CM

## 2018-02-11 DIAGNOSIS — Z7901 Long term (current) use of anticoagulants: Secondary | ICD-10-CM

## 2018-02-11 LAB — POCT INR: INR: 2.4 (ref 2.0–3.0)

## 2018-02-13 ENCOUNTER — Telehealth: Payer: Self-pay | Admitting: Family Medicine

## 2018-02-13 ENCOUNTER — Other Ambulatory Visit: Payer: Self-pay | Admitting: Family Medicine

## 2018-02-13 MED ORDER — PREDNISONE 20 MG PO TABS: 40.0000 mg | ORAL_TABLET | Freq: Every day | ORAL | 0 refills | Status: DC

## 2018-02-13 NOTE — Telephone Encounter (Signed)
Patient's daughter calling answering service. States mom is having gout flare. Requesting prednisone be called in for her to help with symptoms. Has had great relief with this in the past at 40mg  daily dosing. Taking allopurinol but still getting flares. Daughter states she will call office on Monday to get follow up appointment with PCP to discuss treatment/followup. I did send in 5 days supply to requested pharmacy.

## 2018-03-05 DIAGNOSIS — Z952 Presence of prosthetic heart valve: Secondary | ICD-10-CM | POA: Diagnosis not present

## 2018-03-05 DIAGNOSIS — Z7901 Long term (current) use of anticoagulants: Secondary | ICD-10-CM | POA: Diagnosis not present

## 2018-03-05 LAB — POCT INR: INR: 2.6 (ref 2.0–3.0)

## 2018-03-06 ENCOUNTER — Ambulatory Visit (INDEPENDENT_AMBULATORY_CARE_PROVIDER_SITE_OTHER): Payer: Medicare Other | Admitting: Cardiology

## 2018-03-06 DIAGNOSIS — Z952 Presence of prosthetic heart valve: Secondary | ICD-10-CM

## 2018-03-20 ENCOUNTER — Ambulatory Visit (INDEPENDENT_AMBULATORY_CARE_PROVIDER_SITE_OTHER): Payer: Medicare Other | Admitting: Cardiology

## 2018-03-20 DIAGNOSIS — Z952 Presence of prosthetic heart valve: Secondary | ICD-10-CM

## 2018-03-20 DIAGNOSIS — Z5181 Encounter for therapeutic drug level monitoring: Secondary | ICD-10-CM

## 2018-03-20 LAB — POCT INR: INR: 2.7 (ref 2.0–3.0)

## 2018-04-02 ENCOUNTER — Ambulatory Visit (INDEPENDENT_AMBULATORY_CARE_PROVIDER_SITE_OTHER): Payer: Medicare Other | Admitting: Pharmacist

## 2018-04-02 DIAGNOSIS — Z952 Presence of prosthetic heart valve: Secondary | ICD-10-CM | POA: Diagnosis not present

## 2018-04-02 DIAGNOSIS — Z7901 Long term (current) use of anticoagulants: Secondary | ICD-10-CM

## 2018-04-02 LAB — POCT INR: INR: 2.3 (ref 2.0–3.0)

## 2018-04-13 ENCOUNTER — Other Ambulatory Visit: Payer: Self-pay | Admitting: Internal Medicine

## 2018-04-16 ENCOUNTER — Ambulatory Visit (INDEPENDENT_AMBULATORY_CARE_PROVIDER_SITE_OTHER): Payer: Medicare Other | Admitting: Pharmacist

## 2018-04-16 DIAGNOSIS — Z952 Presence of prosthetic heart valve: Secondary | ICD-10-CM

## 2018-04-16 DIAGNOSIS — Z7901 Long term (current) use of anticoagulants: Secondary | ICD-10-CM | POA: Diagnosis not present

## 2018-04-16 LAB — PROTIME-INR: INR: 2.6 — AB (ref 0.9–1.1)

## 2018-04-16 LAB — POCT INR: INR: 2.6 (ref 2.0–3.0)

## 2018-04-28 ENCOUNTER — Telehealth: Payer: Self-pay | Admitting: Internal Medicine

## 2018-04-28 NOTE — Telephone Encounter (Signed)
Contacted patient at her daughter's number, daughter lives with her. Had an annual echo scheduled for 04/30/18 for routine follow up. No symptoms, no shortness of breath, no new complaints. Given active COVID, plan to reschedule echo >12 weeks out. Patient's daughter appreciative of call as they were concerned about coming to the office for an appointment. Jodelle Red, MD, PhD [facility]  [address] [place], [place] [address] [phone]

## 2018-04-30 ENCOUNTER — Ambulatory Visit (INDEPENDENT_AMBULATORY_CARE_PROVIDER_SITE_OTHER): Payer: Medicare Other | Admitting: Pharmacist Clinician (PhC)/ Clinical Pharmacy Specialist

## 2018-04-30 ENCOUNTER — Ambulatory Visit (HOSPITAL_COMMUNITY): Payer: Medicare Other

## 2018-04-30 DIAGNOSIS — I4891 Unspecified atrial fibrillation: Secondary | ICD-10-CM

## 2018-04-30 DIAGNOSIS — Z952 Presence of prosthetic heart valve: Secondary | ICD-10-CM | POA: Diagnosis not present

## 2018-04-30 LAB — POCT INR: INR: 2 (ref 2.0–3.0)

## 2018-05-04 ENCOUNTER — Other Ambulatory Visit: Payer: Self-pay | Admitting: Internal Medicine

## 2018-05-04 NOTE — Telephone Encounter (Signed)
Atorvastatin 40 mg refilled. 

## 2018-05-11 ENCOUNTER — Other Ambulatory Visit: Payer: Self-pay

## 2018-05-11 ENCOUNTER — Other Ambulatory Visit: Payer: Self-pay | Admitting: Internal Medicine

## 2018-05-11 ENCOUNTER — Telehealth: Payer: Self-pay | Admitting: Internal Medicine

## 2018-05-11 MED ORDER — PANTOPRAZOLE SODIUM 40 MG PO TBEC: 40.0000 mg | DELAYED_RELEASE_TABLET | Freq: Every day | ORAL | 6 refills | Status: DC

## 2018-05-11 MED ORDER — ISOSORBIDE MONONITRATE ER 30 MG PO TB24: 30.0000 mg | ORAL_TABLET | Freq: Every day | ORAL | 1 refills | Status: DC

## 2018-05-11 MED ORDER — ATORVASTATIN CALCIUM 40 MG PO TABS: 40.0000 mg | ORAL_TABLET | Freq: Every day | ORAL | 1 refills | Status: DC

## 2018-05-11 MED ORDER — FUROSEMIDE 20 MG PO TABS: 20.0000 mg | ORAL_TABLET | Freq: Every day | ORAL | 1 refills | Status: DC

## 2018-05-11 MED ORDER — ATORVASTATIN CALCIUM 40 MG PO TABS: 40.0000 mg | ORAL_TABLET | Freq: Every day | ORAL | 3 refills | Status: DC

## 2018-05-11 MED ORDER — CARVEDILOL 3.125 MG PO TABS: 3.1250 mg | ORAL_TABLET | Freq: Two times a day (BID) | ORAL | 1 refills | Status: DC

## 2018-05-11 MED ORDER — HYDRALAZINE HCL 50 MG PO TABS: ORAL_TABLET | ORAL | 1 refills | Status: DC

## 2018-05-11 MED ORDER — FUROSEMIDE 20 MG PO TABS: 20.0000 mg | ORAL_TABLET | Freq: Every day | ORAL | 3 refills | Status: DC

## 2018-05-11 NOTE — Telephone Encounter (Signed)
°*  STAT* If patient is at the pharmacy, call can be transferred to refill team.   1. Which medications need to be refilled? (please list name of each medication and dose if known)  atorvastatin (LIPITOR) 40 MG tablet furosemide (LASIX) 20 MG tablet pantoprazole (PROTONIX) 40 MG tablet  2. Which pharmacy/location (including street and city if local pharmacy) is medication to be sent to?  [facility]  3. Do they need a 30 day or 90 day supply? 30  Pt will be out of medication tomorrow.

## 2018-05-11 NOTE — Telephone Encounter (Signed)
Patient's daughter called about need for OV d/t refills per communication with operator. Advised that per MD, OK to refill meds and r/s echo (04/30/18 appt was cancelled d/t covid-19) and then f/up after. Daughter agreed w/plan. Rx(s) sent to pharmacy electronically. Advised her that I will have scheduler contact her to arrange appointments.

## 2018-05-14 ENCOUNTER — Encounter: Payer: Self-pay | Admitting: Internal Medicine

## 2018-05-14 ENCOUNTER — Ambulatory Visit (INDEPENDENT_AMBULATORY_CARE_PROVIDER_SITE_OTHER): Payer: Medicare Other | Admitting: Pharmacist Clinician (PhC)/ Clinical Pharmacy Specialist

## 2018-05-14 DIAGNOSIS — I4891 Unspecified atrial fibrillation: Secondary | ICD-10-CM

## 2018-05-14 DIAGNOSIS — Z952 Presence of prosthetic heart valve: Secondary | ICD-10-CM | POA: Diagnosis not present

## 2018-05-14 DIAGNOSIS — Z7901 Long term (current) use of anticoagulants: Secondary | ICD-10-CM

## 2018-05-14 LAB — PROTIME-INR

## 2018-05-14 LAB — POCT INR: INR: 1.7 — AB (ref 2.0–3.0)

## 2018-05-19 ENCOUNTER — Telehealth: Payer: Self-pay | Admitting: Internal Medicine

## 2018-05-19 MED ORDER — PREDNISONE 20 MG PO TABS: 40.0000 mg | ORAL_TABLET | Freq: Every day | ORAL | 0 refills | Status: DC

## 2018-05-19 NOTE — Telephone Encounter (Signed)
I don't see that they asked which pharmacy but sent to  as [facility] is currently not open.

## 2018-05-19 NOTE — Telephone Encounter (Signed)
Copied from CRM [id number]. Topic: General - Other >> May 19, 2018  7:39 AM Leafy Ro wrote: Reason for CRM: pt daughter tiffany is calling and her mother has gout flare up in left wrist and pt daughter would like gout medication to be sent to [facility] pharm. Pt last seen dr Okey Dupre for gout in dec 2019. Pt advise. Pt is on a gout medication already however the daughter said dr crawford call in another medication when she has flare up

## 2018-05-19 NOTE — Telephone Encounter (Signed)
Noted yes correct pharmacy thanks

## 2018-05-19 NOTE — Telephone Encounter (Signed)
I think she is requesting prednisone that patient usually gets during a flare. I do not know if the [facility] asked if they were able to make a virtual to discuss would you like me to call and see if they are able?

## 2018-05-21 ENCOUNTER — Ambulatory Visit (INDEPENDENT_AMBULATORY_CARE_PROVIDER_SITE_OTHER): Payer: Medicare Other | Admitting: Pharmacist

## 2018-05-21 ENCOUNTER — Encounter: Payer: Self-pay | Admitting: Internal Medicine

## 2018-05-21 DIAGNOSIS — Z7901 Long term (current) use of anticoagulants: Secondary | ICD-10-CM

## 2018-05-21 DIAGNOSIS — Z952 Presence of prosthetic heart valve: Secondary | ICD-10-CM | POA: Diagnosis not present

## 2018-05-21 LAB — POCT INR: INR: 1.7 — AB (ref 2.0–3.0)

## 2018-05-27 ENCOUNTER — Telehealth: Payer: Self-pay | Admitting: Internal Medicine

## 2018-05-27 NOTE — Telephone Encounter (Signed)
To Dr. Rennis Golden to review-   Are you ok with the patient having her echo the same day as her visit with you?  She is currently scheduled for an echo on 07/28/18 & follow up with you on 08/26/18.   Please advise!

## 2018-05-27 NOTE — Telephone Encounter (Signed)
New Message> Daughter would like to know if pt could have her Echo the same day she see Dr Rennis Golden? I know that can be arranged, but how much time do they need to have to read it?

## 2018-05-28 ENCOUNTER — Ambulatory Visit (INDEPENDENT_AMBULATORY_CARE_PROVIDER_SITE_OTHER): Payer: Medicare Other | Admitting: Pharmacist

## 2018-05-28 ENCOUNTER — Encounter: Payer: Self-pay | Admitting: Internal Medicine

## 2018-05-28 ENCOUNTER — Ambulatory Visit (INDEPENDENT_AMBULATORY_CARE_PROVIDER_SITE_OTHER): Payer: Medicare Other | Admitting: Internal Medicine

## 2018-05-28 ENCOUNTER — Ambulatory Visit: Payer: Medicare Other

## 2018-05-28 DIAGNOSIS — Z7901 Long term (current) use of anticoagulants: Secondary | ICD-10-CM

## 2018-05-28 DIAGNOSIS — I4891 Unspecified atrial fibrillation: Secondary | ICD-10-CM | POA: Diagnosis not present

## 2018-05-28 DIAGNOSIS — I10 Essential (primary) hypertension: Secondary | ICD-10-CM | POA: Diagnosis not present

## 2018-05-28 DIAGNOSIS — Z952 Presence of prosthetic heart valve: Secondary | ICD-10-CM | POA: Diagnosis not present

## 2018-05-28 DIAGNOSIS — F0150 Vascular dementia without behavioral disturbance: Secondary | ICD-10-CM | POA: Diagnosis not present

## 2018-05-28 DIAGNOSIS — M1A0310 Idiopathic chronic gout, right wrist, without tophus (tophi): Secondary | ICD-10-CM

## 2018-05-28 LAB — POCT INR: INR: 2.1 (ref 2.0–3.0)

## 2018-05-28 NOTE — Assessment & Plan Note (Signed)
BP at goal, ordered CMP which they will get when able due to pandemic. Taking coreg, hydralazine, lasix, imdur.

## 2018-05-28 NOTE — Telephone Encounter (Signed)
Pt's daughter informed that Dr. Rennis Golden is ok with pt having ECHO the same day as appointment. Will route message back to scheduler to reschedule.

## 2018-05-28 NOTE — Telephone Encounter (Signed)
I'm ok with it as long as I can take a look at it before the visit - also, check to see if it is ok from a billing standpoint.  Dr Rexene Edison

## 2018-05-28 NOTE — Assessment & Plan Note (Signed)
Needs uric acid level as she is on allopurinol and they have not come for this yet. Given pandemic they elect to delay at this time. No flare recently.

## 2018-05-28 NOTE — Progress Notes (Signed)
Virtual Visit via Video Note  I connected with [name] on 05/28/18 at  9:00 AM EDT by a video enabled telemedicine application and verified that I am speaking with the correct person using two identifiers.  The patient and the provider were at separate locations throughout the entire encounter.   I discussed the limitations of evaluation and management by telemedicine and the availability of in person appointments. The patient expressed understanding and agreed to proceed.  History of Present Illness: The patient is a 73 y.o. female with visit for follow up memory (vascular dementia, stable overall, some worsening in the last few years, living with her daughter for the last several years, no behavioral problems but short term memory is very poor) and blood pressure (taking hydralazine and carvedilol and lasix and imdur, denies high BP readings at home, denies chest pains or headaches or SOB, no symptoms of covid-19) and gout (recent flare with right hand pain, they waited until later in the day to get prednisone prescription and the pain was almost gone so they did not take, she is taking allopurinol and have not been able to come for labs yet, lives with her daughter who is a single parent to a 4 YO, denies change in diet).   Observations/Objective: Appearance: normal, breathing appears normal, casual grooming, abdomen does not appear distended, throat normal, memory poor, mental status is awake  Assessment and Plan: See problem oriented charting  Follow Up Instructions: refill meds, labs in and they will wait for these until safe, get uric acid level when able  I discussed the assessment and treatment plan with the patient. The patient was provided an opportunity to ask questions and all were answered. The patient agreed with the plan and demonstrated an understanding of the instructions.   The patient was advised to call back or seek an in-person evaluation if the symptoms worsen  or if the condition fails to improve as anticipated.  Myrlene Broker, MD

## 2018-05-28 NOTE — Assessment & Plan Note (Addendum)
Still living with daughter and memory is fairly stable although poor. We talked about prognosis and potential for alzheimer's as well which can cause worsening. Talked about finances and medication monitoring as well given poor memory and they are already doing this. She is on aricept as well to help mitigate worsening.

## 2018-06-11 ENCOUNTER — Other Ambulatory Visit: Payer: Self-pay | Admitting: Internal Medicine

## 2018-06-11 ENCOUNTER — Ambulatory Visit (INDEPENDENT_AMBULATORY_CARE_PROVIDER_SITE_OTHER): Payer: Medicare Other | Admitting: Pharmacist

## 2018-06-11 DIAGNOSIS — Z7901 Long term (current) use of anticoagulants: Secondary | ICD-10-CM

## 2018-06-11 DIAGNOSIS — Z952 Presence of prosthetic heart valve: Secondary | ICD-10-CM | POA: Diagnosis not present

## 2018-06-11 LAB — POCT INR: INR: 2.3 (ref 2.0–3.0)

## 2018-06-25 ENCOUNTER — Ambulatory Visit (INDEPENDENT_AMBULATORY_CARE_PROVIDER_SITE_OTHER): Payer: Medicare Other | Admitting: Internal Medicine

## 2018-06-25 DIAGNOSIS — Z952 Presence of prosthetic heart valve: Secondary | ICD-10-CM | POA: Diagnosis not present

## 2018-06-25 LAB — POCT INR: INR: 2.8 (ref 2.0–3.0)

## 2018-07-09 ENCOUNTER — Ambulatory Visit (INDEPENDENT_AMBULATORY_CARE_PROVIDER_SITE_OTHER): Payer: Medicare Other | Admitting: Cardiology

## 2018-07-09 DIAGNOSIS — Z952 Presence of prosthetic heart valve: Secondary | ICD-10-CM

## 2018-07-09 LAB — POCT INR: INR: 2.5 (ref 2.0–3.0)

## 2018-07-09 NOTE — Patient Instructions (Signed)
Description   Called spoke with pt's daughter, advised to have pt continue on same dosage 1.5 tablets daily except 1 tablet each Mondays and Fridays.  Repeat INR in 2 weeks.

## 2018-07-23 ENCOUNTER — Ambulatory Visit (INDEPENDENT_AMBULATORY_CARE_PROVIDER_SITE_OTHER): Payer: Medicare Other | Admitting: Cardiology

## 2018-07-23 DIAGNOSIS — Z952 Presence of prosthetic heart valve: Secondary | ICD-10-CM

## 2018-07-23 DIAGNOSIS — Z7901 Long term (current) use of anticoagulants: Secondary | ICD-10-CM | POA: Diagnosis not present

## 2018-07-23 LAB — POCT INR: INR: 2.6 (ref 2.0–3.0)

## 2018-07-25 NOTE — Progress Notes (Addendum)
I cannot sign this note.  Says that needs follow-up appointment listed.  Follow-up 08/06/2018

## 2018-07-28 ENCOUNTER — Other Ambulatory Visit (HOSPITAL_COMMUNITY): Payer: Medicare Other

## 2018-08-06 ENCOUNTER — Ambulatory Visit (INDEPENDENT_AMBULATORY_CARE_PROVIDER_SITE_OTHER): Payer: Medicare Other | Admitting: Pharmacist

## 2018-08-06 DIAGNOSIS — Z952 Presence of prosthetic heart valve: Secondary | ICD-10-CM | POA: Diagnosis not present

## 2018-08-06 DIAGNOSIS — Z7901 Long term (current) use of anticoagulants: Secondary | ICD-10-CM

## 2018-08-06 LAB — POCT INR: INR: 2.2 (ref 2.0–3.0)

## 2018-08-10 ENCOUNTER — Other Ambulatory Visit: Payer: Self-pay | Admitting: Internal Medicine

## 2018-08-10 NOTE — Telephone Encounter (Signed)
Patient's daughter calling in to check on status of med refill and would like it sent to the [facility].

## 2018-08-10 NOTE — Telephone Encounter (Signed)
Routing to CMA 

## 2018-08-20 ENCOUNTER — Ambulatory Visit (INDEPENDENT_AMBULATORY_CARE_PROVIDER_SITE_OTHER): Payer: Medicare Other | Admitting: Pharmacist

## 2018-08-20 DIAGNOSIS — Z952 Presence of prosthetic heart valve: Secondary | ICD-10-CM

## 2018-08-20 DIAGNOSIS — I4891 Unspecified atrial fibrillation: Secondary | ICD-10-CM

## 2018-08-20 LAB — POCT INR: INR: 2.3 (ref 2.0–3.0)

## 2018-08-26 ENCOUNTER — Ambulatory Visit (INDEPENDENT_AMBULATORY_CARE_PROVIDER_SITE_OTHER): Payer: Medicare Other | Admitting: Internal Medicine

## 2018-08-26 ENCOUNTER — Ambulatory Visit (HOSPITAL_COMMUNITY): Payer: Medicare Other | Attending: Cardiovascular Disease

## 2018-08-26 ENCOUNTER — Encounter: Payer: Self-pay | Admitting: Internal Medicine

## 2018-08-26 ENCOUNTER — Other Ambulatory Visit: Payer: Self-pay

## 2018-08-26 VITALS — BP 126/74 | HR 46 | Temp 97.3°F | Ht 64.0 in | Wt 124.8 lb

## 2018-08-26 DIAGNOSIS — E785 Hyperlipidemia, unspecified: Secondary | ICD-10-CM

## 2018-08-26 DIAGNOSIS — I4891 Unspecified atrial fibrillation: Secondary | ICD-10-CM | POA: Diagnosis not present

## 2018-08-26 DIAGNOSIS — Z7901 Long term (current) use of anticoagulants: Secondary | ICD-10-CM | POA: Diagnosis not present

## 2018-08-26 DIAGNOSIS — Z953 Presence of xenogenic heart valve: Secondary | ICD-10-CM

## 2018-08-26 NOTE — Progress Notes (Signed)
OFFICE NOTE  Chief Complaint:  No complaints  Primary Care Physician: Myrlene Broker, MD  HPI:  [name] is a pleasant 12 her old female who unfortunately had a spontaneous valvular endocarditis after a pneumonia in 2000. She ultimately had severe mitral regurgitation and destruction of the mitral valve necessitating emergent replacement with a 29 mm mechanical St. Jude valve by Dr. Cornelius Moras. Since that time she's done very well. She continues to exercise and did not get any shortness of breath or chest pain. She also has dyslipidemia and hypertension which is well managed. She is chronically on warfarin therapy which is followed in this office.  Her last echocardiogram to evaluate valvular gradients was in 2011 which showed an EF of  greater than 55%. The gradients across the valve were 9 and 4.9 mm Hg.  I saw [name] back in the office today. She is doing extremely well. She denies any shortness of breath or worsening chest pain. She continues to exercise. Her weight is appropriate. Carina was mildly elevated today and is followed by [name]. Her mitral valve apparently is working well. Her last echocardiogram in 2015 showed stable gradients and normal LV function. She is due for cholesterol reassessment. Of note her blood pressure is elevated today. She says that she's been out of her medicines for a little while and that there were no refills that were authorized, however our system indicates that they are available.  [name] returns today for follow-up from her recent hospitalization. She was admitted for congestive heart failure. An echocardiogram showed an EF of 25-30%. This represents an acute decline from her preoperative EF which was normal this past summer. Unfortunately the summer she developed valve thrombosis and had to have a redo mitral valve surgery. She was doing fairly well for that for while and then developed worsening shortness of breath and was  found to have cardiomyopathy. It's unclear whether her EF drop was perioperative course at some later time. She was diuresed and treated by the advanced heart failure service. She had a right heart catheterization and since her hospitalization has been doing better. She does not appear to have had recurrent atrial fibrillation and is been on amiodarone therapy as well as warfarin anticoagulation. She says she has had a productive cough for the last several weeks after an upper respiratory infection but it is improving. Blood pressure today is noted to be very low at 94/72. Her weight is actually 5 pounds lower than her discharge weight.  05/19/2015  [name] returns today for follow-up. Overall she's been doing fairly well. Blood pressure has been running higher recently. Her hydralazine is at 50 mg 3 times a day. She's also on carvedilol 3125 twice a day however heart rate is in the low 60s. She appears euvolemic and her weight is been stable. She is taking amiodarone without recurrent A. fib. I had a discussion with her neurologist about her anticoagulation. She is interested in her going on to a novel oral anticoagulant, however I stressed the fact that there is little data and no FDA approval for use of those medications in patients with valvular A. fib. I would be concerned about making that switch. I feel that with better attempts at compliance we would be able to get more regulated INR levels. In addition, her bioprosthetic valve is much less likely at this point to thrombose (essentially the warfarin is for a-fib).  12/22/2015  [name] was seen today in  follow-up. She reports doing fairly well. Her daughter was accompanying her today and has been helping her with her medications. She's been placing her warfarin into her pill boxes and that is helped with compliance. INRs have recently been very well controlled. She does have valvular A. fib and as previously mentioned, the novel  oral anticoagulants are not FDA approved for valvular atrial fibrillation. I would feel more comfortable if we continue with warfarin. She denies any worsening heart failure symptoms and remains on low-dose furosemide. Blood pressure was mildly elevated today however she did not take her morning medications. She is maintaining sinus rhythm and is had not recurrence of atrial fibrillation on low-dose amiodarone. She is due for repeat thyroid, liver function and pulmonary function tests.  08/30/2016  [name] was seen today in follow-up. We're now checking her INRs every 2-3 weeks due to significant lability. There is been a history of memory loss and confusion and we suspect noncompliance with actually taking her medicines. Although the medicines are laid out, it is not always confirmed that she is taking the pills. Unfortunately today her INR was very low 1.4. Her daughter mentioned that she had been staying with her son for the past week. Although overall she is asymptomatic and her asked echo was in February which showed a normal valve gradient and an improved ejection fraction is 60-65%. She was supposed to undergo dental surgery but has not yet had that. She will need antibiotic prophylaxis and is written for clindamycin.  05/01/2017  [name] returns today for follow-up.  Over the past year she is done well.  Today her INR was slightly low at 1.9.  Her daughter reports she has had some progressive memory loss.  Her echo last year showed stable valve gradients and normal LVEF.  She denies any chest pain or worsening shortness of breath.  She has been on some prednisone recently for gout.  She is now on allopurinol.  She also takes atorvastatin and is not had a repeat lipid profile since May 2018.  08/26/2018  [name] is seen today in follow-up.  She is accompanied by her daughter and seems to be doing well.  She denies any chest pain or worsening shortness of breath.  She has  been following her INRs at home which are then managed by pharmacist.  They have been stable and therapeutic.  Her blood pressure is well controlled today 126/74.  Weight is stable.  Heart rate is low in the 40s however at home tends to be a little higher.  She is asymptomatic with that.  She did have a repeat echo today.  I personally reviewed this although it has not been formalized, the LVEF is normal and her prosthetic valves appear to be functioning normally.  PMHx:  Past Medical History:  Diagnosis Date   Acute CHF (congestive heart failure) (HCC) 12/14/2014   Hyperlipidemia    Hypertension    Prosthetic valve dysfunction 07/22/2014   Acute mitral stenosis due to restricted leaflet mobility of bileaflet mechanical mitral valve prosthesis   S/P MVR (mitral valve replacement) 04/11/1998   #29 St. 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   Valvular endocarditis 2000   Streptococcus    Past Surgical History:  Procedure Laterality Date   ABDOMINAL  HYSTERECTOMY     CARDIAC CATHETERIZATION N/A 07/24/2014   Procedure: Right/Left Heart Cath and Coronary Angiography;  Surgeon: Wellington Hampshire, MD;  Location: [place] [facility];  Service: Cardiovascular;  Laterality: N/A;   CARDIAC CATHETERIZATION N/A 07/22/2014   Procedure: Fluoroscopy Guidance;  Surgeon: Troy Sine, MD;  Location: [place] [facility];  Service: Cardiovascular;  Laterality: N/A;   CARDIAC CATHETERIZATION N/A 12/16/2014   Procedure: Right Heart Cath;  Surgeon: Larey Dresser, MD;  Location: [place] [facility];  Service: Cardiovascular;  Laterality: N/A;   CARDIAC VALVE REPLACEMENT     MITRAL VALVE REPLACEMENT  04/11/1998   St. Jude mechanical MVR (severe MR, episode of streptococcal bacterial  endocarditis - Dr. Roxy Manns   MITRAL VALVE REPLACEMENT N/A 07/25/2014   Procedure: REDO MITRAL VALVE REPLACEMENT (MVR);  Surgeon: Rexene Alberts, MD;  Location: [place];  Service: Open Heart Surgery;  Laterality: N/A;   TOTAL ABDOMINAL HYSTERECTOMY W/ BILATERAL SALPINGOOPHORECTOMY  12/14/1998   TRANSTHORACIC ECHOCARDIOGRAM  11/2009   EF=>55%; bi-leaflet St. Jude mech prosthesis; mild TR, RVSP elevated at 40-47mmHg, mod pulm HTN; trace AV regurg; mild pulm valve regurg   TUBAL LIGATION      FAMHx:  Family History  Problem Relation Age of Onset   Lung cancer Mother    Heart attack Mother    Heart failure Maternal Grandmother    Heart failure Maternal Grandfather    Stroke Maternal Grandfather    Stroke Father    Colon cancer Father    Diabetes Father    Stroke Sister    Lung disease Neg Hx     SOCHx:   reports that she quit smoking about 45 years ago. Her smoking use included cigarettes. She smoked 0.10 packs per day. She has never used smokeless tobacco. She reports that she does not drink alcohol or use drugs.  ALLERGIES:  Allergies  Allergen Reactions   Augmentin [Amoxicillin-Pot Clavulanate] Other (See Comments)    Unknown reaction Has patient had a PCN reaction causing immediate rash, facial/tongue/throat swelling, SOB or lightheadedness with hypotension Has patient had a PCN reaction causing severe rash involving mucus membranes or skin necrosis Has patient had a PCN reaction that required hospitalization  Has patient had a PCN reaction occurring within the last 10 years] If all of the above answers are "NO", then may proceed with Cephalosporin use.     ROS: Pertinent items noted in HPI and remainder of comprehensive ROS otherwise negative.  HOME MEDS: Current Outpatient Medications  Medication Sig Dispense Refill   allopurinol (ZYLOPRIM) 100 MG tablet TAKE 1 TABLET (100 MG TOTAL) BY MOUTH DAILY. 30 tablet 1   aspirin EC 81 MG tablet Take 1 tablet (81 mg  total) by mouth daily. 30 tablet 0   atorvastatin (LIPITOR) 40 MG tablet Take 1 tablet (40 mg total) by mouth daily. 90 tablet 1   carvedilol (COREG) 3.125 MG tablet Take 1 tablet (3.125 mg total) by mouth 2 (two) times a day. 180 tablet 1   cyclobenzaprine (FLEXERIL) 5 MG tablet Take 1 tablet (5 mg total) by mouth 2 (two) times daily as needed for muscle spasms. 30 tablet 1   donepezil (ARICEPT) 10 MG tablet TAKE 1 TABLET BY MOUTH AT BEDTIME. 90 tablet 1   furosemide (LASIX) 20 MG tablet Take 1 tablet (20 mg total) by mouth daily. 90 tablet 1   hydrALAZINE (APRESOLINE) 50 MG tablet TAKE 1 & 1/2 TABLETS BY MOUTH 3 TIMES DAILY 405 tablet 1  isosorbide mononitrate (IMDUR) 30 MG 24 hr tablet Take 1 tablet (30 mg total) by mouth daily. 90 tablet 1   pantoprazole (PROTONIX) 40 MG tablet Take 1 tablet (40 mg total) by mouth daily. 30 tablet 6   predniSONE (DELTASONE) 20 MG tablet Take 2 tablets (40 mg total) by mouth daily with breakfast. 10 tablet 0   warfarin (COUMADIN) 2 MG tablet TAKE 1 TO 1.5 TABLETS BY MOUTH DAILY OR AS DIRECTED BY COUMADIN CLINIC 150 tablet 1   No current facility-administered medications for this visit.     LABS/IMAGING: No results found for this or any previous visit (from the past 48 hour(s)). No results found.  VITALS: BP 126/74    Pulse (!) 46    Temp (!) 97.3 F (36.3 C) (Temporal)    Ht 5\' 4"  (1.626 m)    Wt 124 lb 12.8 oz (56.6 kg)    L MP  (LMP Unknown)    SpO2 98%    BMI 21.42 kg/m   EXAM: General appearance: alert and no distress Neck: no carotid bruit, no JVD and thyroid not enlarged, symmetric, no tenderness/mass/nodules Lungs: clear to auscultation bilaterally Heart: regular rate and rhythm, S1, S2 normal and systolic murmur: early systolic 2/6, crescendo at lower left sternal border Abdomen: soft, non-tender; bowel sounds normal; no masses,  no organomegaly Extremities: extremities normal, atraumatic, no cyanosis or edema Pulses: 2+ and  symmetric Skin: Skin color, texture, turgor normal. No rashes or lesions Neurologic: Mental status: Awake, follows commands Psych: Pleasant  EKG: Sinus bradycardia at 46, RBBB-personally reviewed  ASSESSMENT: 1. History of 29 mm St. Jude mechanical mitral valve replacement (2000) for SBE with valve thrombosis and subsequent re-do MVR with a 27 mm Edwards Magna-Ease bioprosthetic valve 2. Acute systolic congestive heart failure-EF 25-30% (improved to 60-65% by echo in 02/2016) 3. HTN 4. Dyslipidemia 5. Paroxysmal atrial fibrillation- CHADSVASC score of 6 6. Chronic anticoagulation on warfarin 7. RBBB 8. Progressive memory loss  PLAN: 1.   [name] has stable findings with regards to her valves on echo which I personally reviewed today.  LVEF remains normal.  Blood pressures well controlled.  She is overdue for assessment of her cholesterol would like to get a nonfasting lipid today.  She is stable INR is on warfarin.  Since her daughter has been helping care for her she is done extraordinarily well.  Plan follow-up with me in 6 months or sooner as necessary.  Chrystie NoseKenneth C. Breionna Punt, MD, [facility] Medical CenterFACC, FACP  [place]   [facility]  Medical Director of the Advanced Lipid Disorders &  Cardiovascular Risk Reduction Clinic Diplomate of the American Board of Clinical Lipidology Attending Cardiologist  Direct Dial: [phone]   Fax: [phone]  Website:  [email or web address]  Andrya Roppolo C Jenan Ellegood 08/26/2018, 11:19 AM

## 2018-08-26 NOTE — Patient Instructions (Signed)
Medication Instructions:  Your physician recommends that you continue on your current medications as directed. Please refer to the Current Medication list given to you today.  If you need a refill on your cardiac medications before your next appointment, please call your pharmacy.   Lab work: LAB WORK TODAY to check cholesterol If you have labs (blood work) drawn today and your tests are completely normal, you will receive your results only by: Marland Kitchen MyChart Message (if you have MyChart) OR . A paper copy in the mail If you have any lab test that is abnormal or we need to change your treatment, we will call you to review the results.  Testing/Procedures: NONE  Follow-Up: At [facility], you and your health needs are our priority.  As part of our continuing mission to provide you with exceptional heart care, we have created designated Provider Care Teams.  These Care Teams include your primary Cardiologist (physician) and Advanced Practice Providers (APPs -  Physician Assistants and Nurse Practitioners) who all work together to provide you with the care you need, when you need it. You will need a follow up appointment in 6 months.  Please call our office 2 months in advance to schedule this appointment.  You may see Pixie Casino, MD or one of the following Advanced Practice Providers on your designated Care Team: [place], [place] . Fabian Sharp, PA-C  Any Other Special Instructions Will Be Listed Below (If Applicable).

## 2018-08-27 LAB — LIPID PANEL
Chol/HDL Ratio: 1.9 ratio (ref 0.0–4.4)
Cholesterol, Total: 169 mg/dL (ref 100–199)
HDL: 89 mg/dL (ref >39)
LDL Calculated: 69 mg/dL (ref 0–99)
Triglycerides: 53 mg/dL (ref 0–149)
VLDL Cholesterol Cal: 11 mg/dL (ref 5–40)

## 2018-09-01 ENCOUNTER — Encounter: Payer: Self-pay | Admitting: Internal Medicine

## 2018-09-02 ENCOUNTER — Other Ambulatory Visit: Payer: Self-pay | Admitting: Internal Medicine

## 2018-09-02 ENCOUNTER — Telehealth: Payer: Self-pay | Admitting: Internal Medicine

## 2018-09-02 NOTE — Telephone Encounter (Signed)
Please review for refill. Thank you! 

## 2018-09-02 NOTE — Telephone Encounter (Signed)
Please review for refill.  

## 2018-09-02 NOTE — Telephone Encounter (Signed)
New Message    *STAT* If patient is at the pharmacy, call can be transferred to refill team.   1. Which medications need to be refilled? (please list name of each medication and dose if known) warfarin (COUMADIN) 2 MG tablet   2. Which pharmacy/location (including street and city if local pharmacy) is medication to be sent to? [place], [place]  3. Do they need a 30 day or 90 day supply? 90 day

## 2018-09-03 ENCOUNTER — Ambulatory Visit (INDEPENDENT_AMBULATORY_CARE_PROVIDER_SITE_OTHER): Payer: Medicare Other | Admitting: Cardiovascular Disease

## 2018-09-03 DIAGNOSIS — Z952 Presence of prosthetic heart valve: Secondary | ICD-10-CM

## 2018-09-03 LAB — POCT INR: INR: 2 (ref 2.0–3.0)

## 2018-09-17 ENCOUNTER — Ambulatory Visit (INDEPENDENT_AMBULATORY_CARE_PROVIDER_SITE_OTHER): Payer: Medicare Other | Admitting: Pharmacist

## 2018-09-17 DIAGNOSIS — I4891 Unspecified atrial fibrillation: Secondary | ICD-10-CM

## 2018-09-17 DIAGNOSIS — Z952 Presence of prosthetic heart valve: Secondary | ICD-10-CM

## 2018-09-17 DIAGNOSIS — Z7901 Long term (current) use of anticoagulants: Secondary | ICD-10-CM | POA: Diagnosis not present

## 2018-09-17 LAB — POCT INR: INR: 2.5 (ref 2.0–3.0)

## 2018-09-25 ENCOUNTER — Other Ambulatory Visit: Payer: Self-pay | Admitting: Internal Medicine

## 2018-09-25 NOTE — Telephone Encounter (Signed)
Medication Refill - Medication: predniSONE (DELTASONE) 20 MG tablet  Pt is having a flare up, please advise. Left foot  Has the patient contacted their pharmacy? Yes.   (Agent: If no, request that the patient contact the pharmacy for the refill.) (Agent: If yes, when and what did the pharmacy advise?)  Preferred Pharmacy (with phone number or street name): 1st if tonight  [id number] [address], [place]  [place] [address]  Phone: [phone] Fax: [phone]  2nd if after 09/25/2018 [place], [place]  [place] [address]  Phone: [phone] Fax: [phone]            Agent: Please be advised that RX refills may take up to 3 business days. We ask that you follow-up with your pharmacy.

## 2018-09-25 NOTE — Telephone Encounter (Signed)
Refill for prednisone. Med can not be delegated LOV 05/28/2018. No upcoming appointment Dr. Sharlet Salina

## 2018-09-28 MED ORDER — PREDNISONE 20 MG PO TABS: 40.0000 mg | ORAL_TABLET | Freq: Every day | ORAL | 0 refills | Status: DC

## 2018-09-29 ENCOUNTER — Telehealth: Payer: Self-pay

## 2018-09-29 NOTE — Telephone Encounter (Signed)
Received a THN daughter called stating mother is having a gout flare and would like something sent in

## 2018-09-29 NOTE — Telephone Encounter (Signed)
Noted  

## 2018-09-29 NOTE — Telephone Encounter (Signed)
I believe prednisone was just refilled recently.

## 2018-10-03 LAB — POCT INR: INR: 2.4 (ref 2.0–3.0)

## 2018-10-05 ENCOUNTER — Ambulatory Visit (INDEPENDENT_AMBULATORY_CARE_PROVIDER_SITE_OTHER): Payer: Medicare Other | Admitting: Pharmacist Clinician (PhC)/ Clinical Pharmacy Specialist

## 2018-10-05 DIAGNOSIS — Z7901 Long term (current) use of anticoagulants: Secondary | ICD-10-CM | POA: Diagnosis not present

## 2018-10-05 DIAGNOSIS — Z952 Presence of prosthetic heart valve: Secondary | ICD-10-CM | POA: Diagnosis not present

## 2018-10-12 ENCOUNTER — Other Ambulatory Visit: Payer: Self-pay | Admitting: Internal Medicine

## 2018-10-29 ENCOUNTER — Ambulatory Visit (INDEPENDENT_AMBULATORY_CARE_PROVIDER_SITE_OTHER): Payer: Medicare Other | Admitting: Pharmacist

## 2018-10-29 DIAGNOSIS — Z7901 Long term (current) use of anticoagulants: Secondary | ICD-10-CM | POA: Diagnosis not present

## 2018-10-29 DIAGNOSIS — Z952 Presence of prosthetic heart valve: Secondary | ICD-10-CM | POA: Diagnosis not present

## 2018-10-29 LAB — POCT INR: INR: 2.1 (ref 2.0–3.0)

## 2018-11-12 ENCOUNTER — Ambulatory Visit (INDEPENDENT_AMBULATORY_CARE_PROVIDER_SITE_OTHER): Payer: Medicare Other

## 2018-11-12 DIAGNOSIS — Z952 Presence of prosthetic heart valve: Secondary | ICD-10-CM | POA: Diagnosis not present

## 2018-11-12 LAB — POCT INR: INR: 2.4 (ref 2.0–3.0)

## 2018-11-16 ENCOUNTER — Other Ambulatory Visit: Payer: Self-pay

## 2018-11-16 DIAGNOSIS — Z20822 Contact with and (suspected) exposure to covid-19: Secondary | ICD-10-CM

## 2018-11-17 LAB — NOVEL CORONAVIRUS, NAA: SARS-CoV-2, NAA: NOT DETECTED

## 2018-11-20 ENCOUNTER — Other Ambulatory Visit: Payer: Self-pay | Admitting: Internal Medicine

## 2018-11-26 ENCOUNTER — Ambulatory Visit (INDEPENDENT_AMBULATORY_CARE_PROVIDER_SITE_OTHER): Payer: Medicare Other | Admitting: Cardiology

## 2018-11-26 DIAGNOSIS — Z953 Presence of xenogenic heart valve: Secondary | ICD-10-CM

## 2018-11-26 DIAGNOSIS — Z952 Presence of prosthetic heart valve: Secondary | ICD-10-CM

## 2018-11-26 DIAGNOSIS — Z7901 Long term (current) use of anticoagulants: Secondary | ICD-10-CM | POA: Diagnosis not present

## 2018-11-26 LAB — POCT INR: INR: 2.7 (ref 2.0–3.0)

## 2018-12-10 ENCOUNTER — Other Ambulatory Visit: Payer: Self-pay | Admitting: Internal Medicine

## 2018-12-10 ENCOUNTER — Ambulatory Visit: Payer: Self-pay | Admitting: Cardiology

## 2018-12-10 DIAGNOSIS — Z952 Presence of prosthetic heart valve: Secondary | ICD-10-CM

## 2018-12-10 LAB — POCT INR: INR: 2.2 (ref 2.0–3.0)

## 2018-12-15 ENCOUNTER — Other Ambulatory Visit: Payer: Self-pay | Admitting: Internal Medicine

## 2018-12-24 ENCOUNTER — Ambulatory Visit (INDEPENDENT_AMBULATORY_CARE_PROVIDER_SITE_OTHER): Payer: Medicare Other | Admitting: Cardiology

## 2018-12-24 DIAGNOSIS — Z952 Presence of prosthetic heart valve: Secondary | ICD-10-CM

## 2018-12-24 LAB — POCT INR: INR: 2.3 (ref 2.0–3.0)

## 2019-01-05 ENCOUNTER — Other Ambulatory Visit: Payer: Self-pay | Admitting: Internal Medicine

## 2019-01-07 ENCOUNTER — Ambulatory Visit (INDEPENDENT_AMBULATORY_CARE_PROVIDER_SITE_OTHER): Payer: Medicare Other | Admitting: Cardiovascular Disease

## 2019-01-07 DIAGNOSIS — Z952 Presence of prosthetic heart valve: Secondary | ICD-10-CM | POA: Diagnosis not present

## 2019-01-07 LAB — POCT INR: INR: 2 (ref 2.0–3.0)

## 2019-01-21 ENCOUNTER — Ambulatory Visit (INDEPENDENT_AMBULATORY_CARE_PROVIDER_SITE_OTHER): Payer: Medicare Other | Admitting: Cardiology

## 2019-01-21 DIAGNOSIS — Z952 Presence of prosthetic heart valve: Secondary | ICD-10-CM

## 2019-01-21 DIAGNOSIS — Z7901 Long term (current) use of anticoagulants: Secondary | ICD-10-CM | POA: Diagnosis not present

## 2019-01-21 LAB — POCT INR: INR: 2.2 (ref 2.0–3.0)

## 2019-02-04 ENCOUNTER — Ambulatory Visit (INDEPENDENT_AMBULATORY_CARE_PROVIDER_SITE_OTHER): Payer: Medicare Other | Admitting: Pharmacist

## 2019-02-04 DIAGNOSIS — Z952 Presence of prosthetic heart valve: Secondary | ICD-10-CM

## 2019-02-04 DIAGNOSIS — I4891 Unspecified atrial fibrillation: Secondary | ICD-10-CM | POA: Diagnosis not present

## 2019-02-04 DIAGNOSIS — Z7901 Long term (current) use of anticoagulants: Secondary | ICD-10-CM | POA: Diagnosis not present

## 2019-02-04 LAB — POCT INR: INR: 2.3 (ref 2.0–3.0)

## 2019-02-09 ENCOUNTER — Other Ambulatory Visit: Payer: Self-pay | Admitting: Internal Medicine

## 2019-02-09 NOTE — Telephone Encounter (Signed)
Is this ok to fill?  I've reviewed her chart and noticed that she has [employer] and the last couple visit that she has had has been for gout flare up on 05/28/18 and 12/22/17 . Is an OV needed?

## 2019-02-09 NOTE — Telephone Encounter (Signed)
Fine to refill 

## 2019-02-10 ENCOUNTER — Telehealth: Payer: Self-pay

## 2019-02-10 NOTE — Telephone Encounter (Signed)
Patient's daughter calling and states that the patient has a crick in her neck and she wants to be sure she's doing everything she can to help it go away. Please advise.

## 2019-02-11 NOTE — Telephone Encounter (Signed)
Pt's daughter, [name], has called back. She stated that the pt has been using bio-freeze on the area and a neck massager but it doesn't seems to be helping. Pain is still the same and rates it as a 9 on a scale of 1-10 with 10 being a lot of pain. She states that the pain is going around the back of her neck, no quite the right or left side but she hangs her head forward where it seems to be slightly comfortable for a little while.

## 2019-02-11 NOTE — Telephone Encounter (Signed)
Called pt, LVM.   

## 2019-02-12 ENCOUNTER — Encounter: Payer: Self-pay | Admitting: Internal Medicine

## 2019-02-12 ENCOUNTER — Ambulatory Visit (INDEPENDENT_AMBULATORY_CARE_PROVIDER_SITE_OTHER): Payer: Medicare Other | Admitting: Internal Medicine

## 2019-02-12 DIAGNOSIS — M542 Cervicalgia: Secondary | ICD-10-CM | POA: Diagnosis not present

## 2019-02-12 MED ORDER — PREDNISONE 20 MG PO TABS: 40.0000 mg | ORAL_TABLET | Freq: Every day | ORAL | 2 refills | Status: DC

## 2019-02-12 MED ORDER — CYCLOBENZAPRINE HCL 5 MG PO TABS: 5.0000 mg | ORAL_TABLET | Freq: Three times a day (TID) | ORAL | 1 refills | Status: DC | PRN

## 2019-02-12 NOTE — Assessment & Plan Note (Addendum)
Rx flexeril and prednisone to help treat this acute pain. No red flag symptoms to suggest need for imaging at this time.

## 2019-02-12 NOTE — Telephone Encounter (Signed)
Addressed during visit

## 2019-02-12 NOTE — Progress Notes (Signed)
Virtual Visit via Video Note  I connected with [name] on 02/12/19 at  8:40 AM EST by a video enabled telemedicine application and verified that I am speaking with the correct person using two identifiers.  The patient and the provider were at separate locations throughout the entire encounter.   I discussed the limitations of evaluation and management by telemedicine and the availability of in person appointments. The patient expressed understanding and agreed to proceed. The patient and the provider were the only parties present for the visit unless noted in HPI below.  History of Present Illness: The patient is a 74 y.o. female with visit for neck pain. Started about 2-3 days ago when they changed her pillow to a new one. Her daughter helps to provide history due to memory impairment. Has no numbness or tingling down the arms. There is restriction of mobility of the neck. They are trying salon pas and heat and this is not helping. Pain is getting worse today now 8-9/10. Denies injury or overuse. Denies fevers or chills. Overall it is worsening.   Observations/Objective: Appearance: hunched over with neck extended, limited ROM due to pain, breathing appears normal, casual grooming, abdomen does not appear distended, throat normal, memory stable, mental status is awake and oriented to self  Assessment and Plan: See problem oriented charting  Follow Up Instructions: rx flexeril and prednisone burst  I discussed the assessment and treatment plan with the patient. The patient was provided an opportunity to ask questions and all were answered. The patient agreed with the plan and demonstrated an understanding of the instructions.   The patient was advised to call back or seek an in-person evaluation if the symptoms worsen or if the condition fails to improve as anticipated.  Myrlene Broker, MD

## 2019-02-18 ENCOUNTER — Other Ambulatory Visit: Payer: Self-pay | Admitting: Internal Medicine

## 2019-02-18 ENCOUNTER — Ambulatory Visit (INDEPENDENT_AMBULATORY_CARE_PROVIDER_SITE_OTHER): Payer: Medicare Other | Admitting: Cardiovascular Disease

## 2019-02-18 DIAGNOSIS — Z952 Presence of prosthetic heart valve: Secondary | ICD-10-CM

## 2019-02-18 LAB — POCT INR: INR: 4.1 — AB (ref 2.0–3.0)

## 2019-02-25 ENCOUNTER — Ambulatory Visit (INDEPENDENT_AMBULATORY_CARE_PROVIDER_SITE_OTHER): Payer: Medicare Other | Admitting: Pharmacist

## 2019-02-25 DIAGNOSIS — Z7901 Long term (current) use of anticoagulants: Secondary | ICD-10-CM

## 2019-02-25 DIAGNOSIS — Z952 Presence of prosthetic heart valve: Secondary | ICD-10-CM

## 2019-02-25 LAB — POCT INR: INR: 2.7 (ref 2.0–3.0)

## 2019-03-11 ENCOUNTER — Other Ambulatory Visit: Payer: Self-pay | Admitting: Internal Medicine

## 2019-03-11 ENCOUNTER — Ambulatory Visit (INDEPENDENT_AMBULATORY_CARE_PROVIDER_SITE_OTHER): Payer: Medicare Other

## 2019-03-11 DIAGNOSIS — Z952 Presence of prosthetic heart valve: Secondary | ICD-10-CM | POA: Diagnosis not present

## 2019-03-11 DIAGNOSIS — Z7901 Long term (current) use of anticoagulants: Secondary | ICD-10-CM | POA: Diagnosis not present

## 2019-03-11 LAB — POCT INR: INR: 2.6 (ref 2.0–3.0)

## 2019-03-24 ENCOUNTER — Telehealth: Payer: Self-pay

## 2019-03-24 NOTE — Telephone Encounter (Signed)
Please advise 

## 2019-03-24 NOTE — Telephone Encounter (Signed)
New message    Daughter [name] calling, patient has COVID vaccine appt on 3.19.2021 please advise on health history.

## 2019-03-25 ENCOUNTER — Ambulatory Visit (INDEPENDENT_AMBULATORY_CARE_PROVIDER_SITE_OTHER): Payer: Medicare Other | Admitting: Pharmacist Clinician (PhC)/ Clinical Pharmacy Specialist

## 2019-03-25 DIAGNOSIS — Z952 Presence of prosthetic heart valve: Secondary | ICD-10-CM | POA: Diagnosis not present

## 2019-03-25 LAB — POCT INR: INR: 2.9 (ref 2.0–3.0)

## 2019-03-25 NOTE — Telephone Encounter (Signed)
Fine to get

## 2019-03-25 NOTE — Telephone Encounter (Signed)
Patients daughter advised, she verbalized understanding.

## 2019-03-26 DIAGNOSIS — Z23 Encounter for immunization: Secondary | ICD-10-CM | POA: Diagnosis not present

## 2019-04-08 ENCOUNTER — Ambulatory Visit (INDEPENDENT_AMBULATORY_CARE_PROVIDER_SITE_OTHER): Payer: Medicare Other | Admitting: Cardiology

## 2019-04-08 DIAGNOSIS — Z952 Presence of prosthetic heart valve: Secondary | ICD-10-CM | POA: Diagnosis not present

## 2019-04-08 LAB — POCT INR: INR: 2.7 (ref 2.0–3.0)

## 2019-04-12 ENCOUNTER — Other Ambulatory Visit: Payer: Self-pay | Admitting: Internal Medicine

## 2019-04-15 ENCOUNTER — Telehealth: Payer: Self-pay | Admitting: Internal Medicine

## 2019-04-15 NOTE — Telephone Encounter (Signed)
LMOM RE: F/U Visit--- AF 

## 2019-04-22 ENCOUNTER — Ambulatory Visit (INDEPENDENT_AMBULATORY_CARE_PROVIDER_SITE_OTHER): Payer: Medicare Other | Admitting: Cardiology

## 2019-04-22 DIAGNOSIS — Z952 Presence of prosthetic heart valve: Secondary | ICD-10-CM

## 2019-04-22 LAB — POCT INR: INR: 2.5 (ref 2.0–3.0)

## 2019-04-30 ENCOUNTER — Ambulatory Visit: Payer: Medicare Other | Admitting: Internal Medicine

## 2019-05-06 ENCOUNTER — Ambulatory Visit (INDEPENDENT_AMBULATORY_CARE_PROVIDER_SITE_OTHER): Payer: Medicare Other | Admitting: Cardiology

## 2019-05-06 DIAGNOSIS — Z952 Presence of prosthetic heart valve: Secondary | ICD-10-CM | POA: Diagnosis not present

## 2019-05-06 LAB — POCT INR: INR: 2.9 (ref 2.0–3.0)

## 2019-05-18 ENCOUNTER — Other Ambulatory Visit: Payer: Self-pay | Admitting: Internal Medicine

## 2019-05-19 ENCOUNTER — Other Ambulatory Visit: Payer: Self-pay | Admitting: Internal Medicine

## 2019-05-19 NOTE — Telephone Encounter (Signed)
° ° °*  STAT* If patient is at the pharmacy, call can be transferred to refill team.   1. Which medications need to be refilled? (please list name of each medication and dose if known) furosemide (LASIX) 20 MG tablet,   pantoprazole (PROTONIX) 40 MG tablet    2. Which pharmacy/location (including street and city if local pharmacy) is medication to be sent to? [facility] - [place], [place] - [address] [place]  3. Do they need a 30 day or 90 day supply? 30 days

## 2019-05-20 ENCOUNTER — Ambulatory Visit (INDEPENDENT_AMBULATORY_CARE_PROVIDER_SITE_OTHER): Payer: Medicare Other | Admitting: Pharmacist Clinician (PhC)/ Clinical Pharmacy Specialist

## 2019-05-20 DIAGNOSIS — Z7901 Long term (current) use of anticoagulants: Secondary | ICD-10-CM | POA: Diagnosis not present

## 2019-05-20 DIAGNOSIS — I4891 Unspecified atrial fibrillation: Secondary | ICD-10-CM

## 2019-05-20 DIAGNOSIS — Z952 Presence of prosthetic heart valve: Secondary | ICD-10-CM

## 2019-05-20 LAB — POCT INR: INR: 2.7 (ref 2.0–3.0)

## 2019-05-25 ENCOUNTER — Other Ambulatory Visit: Payer: Self-pay | Admitting: Internal Medicine

## 2019-06-03 ENCOUNTER — Ambulatory Visit (INDEPENDENT_AMBULATORY_CARE_PROVIDER_SITE_OTHER): Payer: Medicare Other | Admitting: Cardiology

## 2019-06-03 DIAGNOSIS — Z952 Presence of prosthetic heart valve: Secondary | ICD-10-CM | POA: Diagnosis not present

## 2019-06-03 DIAGNOSIS — Z7901 Long term (current) use of anticoagulants: Secondary | ICD-10-CM

## 2019-06-03 LAB — POCT INR: INR: 2.8 (ref 2.0–3.0)

## 2019-06-10 ENCOUNTER — Other Ambulatory Visit: Payer: Self-pay | Admitting: Internal Medicine

## 2019-06-10 ENCOUNTER — Telehealth: Payer: Self-pay | Admitting: Internal Medicine

## 2019-06-10 MED ORDER — PANTOPRAZOLE SODIUM 40 MG PO TBEC: 40.0000 mg | DELAYED_RELEASE_TABLET | Freq: Every day | ORAL | 1 refills | Status: DC

## 2019-06-10 MED ORDER — HYDRALAZINE HCL 50 MG PO TABS: ORAL_TABLET | ORAL | 2 refills | Status: DC

## 2019-06-10 NOTE — Telephone Encounter (Signed)
Patient wants to know who she needs to talk to about medication refills. She states she runs into a problem everytime she tries to get a refill. She states she is out of two of her medications and she cannot seem to get a prescription sent to her pharmacy. I just sent a refill request to the refill team but she states the pharmacy also faxed over a refill request for medication. Please advise.

## 2019-06-10 NOTE — Telephone Encounter (Signed)
Sent in RX

## 2019-06-10 NOTE — Telephone Encounter (Signed)
°*  STAT* If patient is at the pharmacy, call can be transferred to refill team.   1. Which medications need to be refilled? (please list name of each medication and dose if known) hydrALAZINE (APRESOLINE) 50 MG tablet / pantoprazole (PROTONIX) 40 MG tablet  2. Which pharmacy/location (including street and city if local pharmacy) is medication to be sent to? [facility] - [place], [place] - [address] [place]  3. Do they need a 30 day or 90 day supply? 90   Patient is out of medication.

## 2019-06-10 NOTE — Telephone Encounter (Signed)
Called and spoke to daughter- advised her to either call or send [place] a mychart message and we would be glad to assist in the medications.   Patient daughter was thankful for the call back, and was appreciative for the call.

## 2019-06-17 ENCOUNTER — Other Ambulatory Visit: Payer: Self-pay | Admitting: Internal Medicine

## 2019-06-17 ENCOUNTER — Ambulatory Visit (INDEPENDENT_AMBULATORY_CARE_PROVIDER_SITE_OTHER): Payer: Medicare Other | Admitting: Pharmacist Clinician (PhC)/ Clinical Pharmacy Specialist

## 2019-06-17 DIAGNOSIS — I4891 Unspecified atrial fibrillation: Secondary | ICD-10-CM | POA: Diagnosis not present

## 2019-06-17 DIAGNOSIS — Z7901 Long term (current) use of anticoagulants: Secondary | ICD-10-CM | POA: Diagnosis not present

## 2019-06-17 DIAGNOSIS — Z952 Presence of prosthetic heart valve: Secondary | ICD-10-CM | POA: Diagnosis not present

## 2019-06-17 LAB — POCT INR: INR: 2.7 (ref 2.0–3.0)

## 2019-07-01 ENCOUNTER — Encounter: Payer: Self-pay | Admitting: Internal Medicine

## 2019-07-01 ENCOUNTER — Ambulatory Visit (INDEPENDENT_AMBULATORY_CARE_PROVIDER_SITE_OTHER): Payer: Medicare Other | Admitting: Pharmacist Clinician (PhC)/ Clinical Pharmacy Specialist

## 2019-07-01 DIAGNOSIS — Z952 Presence of prosthetic heart valve: Secondary | ICD-10-CM

## 2019-07-01 DIAGNOSIS — Z7901 Long term (current) use of anticoagulants: Secondary | ICD-10-CM | POA: Diagnosis not present

## 2019-07-01 DIAGNOSIS — I4891 Unspecified atrial fibrillation: Secondary | ICD-10-CM

## 2019-07-01 LAB — POCT INR: INR: 3.5 — AB (ref 2.0–3.0)

## 2019-07-02 ENCOUNTER — Telehealth (INDEPENDENT_AMBULATORY_CARE_PROVIDER_SITE_OTHER): Payer: Medicare Other | Admitting: Internal Medicine

## 2019-07-02 ENCOUNTER — Encounter: Payer: Self-pay | Admitting: Internal Medicine

## 2019-07-02 ENCOUNTER — Other Ambulatory Visit: Payer: Self-pay | Admitting: Internal Medicine

## 2019-07-02 DIAGNOSIS — F0150 Vascular dementia without behavioral disturbance: Secondary | ICD-10-CM | POA: Diagnosis not present

## 2019-07-02 DIAGNOSIS — I5022 Chronic systolic (congestive) heart failure: Secondary | ICD-10-CM

## 2019-07-02 DIAGNOSIS — M1A0310 Idiopathic chronic gout, right wrist, without tophus (tophi): Secondary | ICD-10-CM

## 2019-07-02 MED ORDER — PREDNISONE 20 MG PO TABS: 40.0000 mg | ORAL_TABLET | Freq: Every day | ORAL | 2 refills | Status: DC

## 2019-07-02 NOTE — Assessment & Plan Note (Signed)
No flare today but has had several flares since last visit. Refilled prednisone to fill as needed. Continue allopurinol 100 mg daily.

## 2019-07-02 NOTE — Assessment & Plan Note (Signed)
Taking coreg, hydralazine, imdur, lasix, lipitor.

## 2019-07-02 NOTE — Assessment & Plan Note (Signed)
Overall worsening and with significant decline in the last year. She is now needing to explore options for placement for her mother. Referral to [facility] care management to help her navigate this process.

## 2019-07-02 NOTE — Progress Notes (Signed)
Virtual Visit via Video Note  I connected with [name] on 07/02/19 at 11:00 AM EDT by a video enabled telemedicine application and verified that I am speaking with the correct person using two identifiers.  The patient and the provider were at separate locations throughout the entire encounter. Patient location: home, Provider location: work   I discussed the limitations of evaluation and management by telemedicine and the availability of in person appointments. The patient expressed understanding and agreed to proceed. The patient and the provider were the only parties present for the visit unless noted in HPI below.  History of Present Illness: The patient is a 74 y.o. female with visit for worsening vascular dementia (overall memory is worsening, denies behavioral changes, more sundowning with her mother awakening at night recently and wandering around the house, daughter works and feels her mother is not safe at home alone anymore and feels she needs more assistance than she can safely manage at home, denies cough, fevers or chills, urinary symptoms, has been progressing over the last several years and this has finally gotten to a place where she feels her mom is not safe alone) and gout (has had 2 flares since beginning of year, both in the hands, was able to take prednisone with good resolution of symptoms, denies current flare, would like refill of prednisone if possible to use prn for flares) and chronic heart failure (denies new SOB, chest pains, taking medications as prescribed). Daughter provides history given the memory changes.   Observations/Objective: Appearance: normal, breathing appears normal, casual grooming, abdomen does not appear distended, mental status is A and oriented to person  Assessment and Plan: See problem oriented charting  Follow Up Instructions: referral to [facility] care management to assist with placement and options, refill prednisone  I discussed the  assessment and treatment plan with the patient. The patient was provided an opportunity to ask questions and all were answered. The patient agreed with the plan and demonstrated an understanding of the instructions.   The patient was advised to call back or seek an in-person evaluation if the symptoms worsen or if the condition fails to improve as anticipated.  Myrlene Broker, MD

## 2019-07-05 ENCOUNTER — Ambulatory Visit (INDEPENDENT_AMBULATORY_CARE_PROVIDER_SITE_OTHER): Payer: Medicare Other | Admitting: Cardiology

## 2019-07-05 ENCOUNTER — Other Ambulatory Visit: Payer: Self-pay | Admitting: *Deleted

## 2019-07-05 DIAGNOSIS — Z952 Presence of prosthetic heart valve: Secondary | ICD-10-CM

## 2019-07-05 LAB — POCT INR: INR: 2.2 (ref 2.0–3.0)

## 2019-07-05 NOTE — Patient Outreach (Signed)
[facility]) Care Management  07/05/2019  [name] November 05, 1945 [id number]   Referral Date: 6/25   Referral Source: MD office Referral Reason: Need help with placement Insurance: Next Gen   Outreach attempt #1, unsuccessful, HIPAA compliant voice message left for daughter.  Plan: RN CM will send unsuccessful outreach letter and follow up within the next 3-4 business days.  [name], [place], MSN [facility] Care Management  [facility] Manager [phone]

## 2019-07-06 ENCOUNTER — Other Ambulatory Visit: Payer: Self-pay | Admitting: *Deleted

## 2019-07-06 ENCOUNTER — Encounter: Payer: Self-pay | Admitting: *Deleted

## 2019-07-06 NOTE — Patient Outreach (Signed)
[facility]) Care Management  07/06/2019  [name] 11-22-1945 [id number]   Referral Date: 6/25                  Referral Source: MD office Referral Reason: Need help with placement Insurance: Next Gen  Outreach attempt #2, successful to daughter Marilynne Drivers identity verified.  This care manager introduced self and stated purpose of call.  [facility] care management services explained.   Social: Patient lives with daughter who has been caring for her for the past several years.  State she is still working full time and has a young daughter, finding it difficult to continue caring for member in the home.  Report member is mostly independent with bathing and dressing, but she need constant reminders to do so.  She is looking to have member placed.  Conditions: Per chart, she has history of CHF, A-fib, HTN, dementia, CKD, gout, MVR, and HLD.  She does not monitor member's blood pressure or weights daily but confirms that conditions are well managed.  Medications: [name] report she is managing member's medications.  She is on Coumadin, daughter monitors INR levels  Appointments: Was last seen by PCP on 6/25 via virtual visit.  She has appointments with cardiology on 8/27 and with neurology on 8/24.   Plan: RN CM will place referral to CSW and follow up within the next 2 weeks.  If remain stable and has plan for placement, will close case to nursing.  [name], [place], MSN [facility] Care Management  [facility] Manager [phone]

## 2019-07-07 ENCOUNTER — Encounter: Payer: Self-pay | Admitting: *Deleted

## 2019-07-07 ENCOUNTER — Other Ambulatory Visit: Payer: Self-pay | Admitting: *Deleted

## 2019-07-07 NOTE — Patient Outreach (Signed)
[facility]) Care Management  07/07/2019  Miriah Ulice Brilliant Jan 23, 1945 [id number]  CSW was able to make initial contact with patient's daughter, [name] today to perform the phone assessment on patient, as well as assess and assist with social work needs and services.  CSW introduced self, explained role and types of services provided through [employer] Care Management [facility] Care Management).  CSW further explained to [name] that CSW works with patient's RNCM, also with [facility] Care Management, [name].  CSW then explained the reason for the call, indicating that Mrs. Maurice March thought that patient would benefit from social work services and resources to assist with long-term care placement into an assisted living facility, offering memory care services and the wander-guard system.  In addition, CSW will provide patient and [name] with resources for in-home care services, as well as a list of adult day care programs.  CSW obtained two HIPAA compliant identifiers from [name], which included patient's name and date of birth.  [name] admitted that she is now ready to seek long-term care placement for patient, no longer able to adequately care for patient in the home or leave patient unsupervised or unattended, due to diagnosis of Dementia.  CSW explained the entire placement process to [name], agreeing to go ahead and contact patient's Primary Care Physician, Dr. Hillard Danker to request completion of an FL-2 Form.  In the meantime, CSW agreed to email [name] ([email or web address]) all of the following resource information, in addition to necessary applications: Adult Day Care Centers; In-Home Care and Respite Agencies; Home Health Agencies; PCS (Personal Care services) Application and List of Providers; [employer] manager) Training and development officer; [employer] and Tips; Assisted  Living, Rest Home and Family Care Home Facilities; [facility] Long Term Care Directory - Assisted Living Facilities; [employer] [place] FL-2 Form.  CSW was able to confirm that [name] received the resources and applications via email, prior to terminating the call.  CSW requested that [name] go ahead and apply for Special Assistance Long-Term Care Medicaid for patient, through the [facility] Department of Social Services, as patient will require financial assistance to help pay for long-term care services, only receiving monthly Social Security income.  CSW agreed to follow-up with [name] as soon as CSW is able to obtain patient's completed and signed FL-2 Form from Dr. Okey Dupre, or on Thursday, July 15, 2019, around 11:00am, whichever comes first.  CSW encouraged [name] to review the list of assisted living facilities, provided to her by CSW, and determine which facilities she would like for CSW to pursue, in terms of bed offers.  CSW will fax patient's FL-2 Form to all facilities of choice, as well as contact patient's insurance provider to try and obtain prior authorization.  CSW was able to confirm that [name] has the correct contact information for CSW.  Danford Bad, BSW, MSW, LCSW  Licensed Restaurant manager, fast food Health System  Mailing [place] [address], [place], [place] [address] Physical Address[address], [place], [place] [address] Toll Free Main # [phone] Fax # [phone] Cell # [phone]  Office # [phone] [email or web address]

## 2019-07-08 ENCOUNTER — Ambulatory Visit: Payer: Self-pay | Admitting: *Deleted

## 2019-07-15 ENCOUNTER — Ambulatory Visit (INDEPENDENT_AMBULATORY_CARE_PROVIDER_SITE_OTHER): Payer: Medicare Other | Admitting: Pharmacist

## 2019-07-15 ENCOUNTER — Other Ambulatory Visit: Payer: Self-pay | Admitting: *Deleted

## 2019-07-15 DIAGNOSIS — Z7901 Long term (current) use of anticoagulants: Secondary | ICD-10-CM

## 2019-07-15 DIAGNOSIS — Z952 Presence of prosthetic heart valve: Secondary | ICD-10-CM | POA: Diagnosis not present

## 2019-07-15 DIAGNOSIS — I4891 Unspecified atrial fibrillation: Secondary | ICD-10-CM

## 2019-07-15 LAB — POCT INR: INR: 2.6 (ref 2.0–3.0)

## 2019-07-15 NOTE — Patient Outreach (Signed)
[facility]) Care Management  07/15/2019  [name] Purnell Shoemaker 1945/12/22 [id number]   CSW was able to make contact with patient's daughter, [name] today to follow-up regarding placement arrangements for patient.  CSW was able to assist Mrs. [name] with completing patient's Special Assistance Long-Term Care Medicaid application over the phone.  Mrs. [name] reported that she will be gathering all necessary information and documentation this weekend, in hopes that she will be able to submit patient's application to the [facility] Department of Social Services for processing next week.  Mrs. [name] is aware that it can take 45-60 days for patient's application to be processed.  In the meantime, CSW encouraged [name] of assisted living facilities, with memory care services and the wander-guard system, provided to her by CSW last week, and decide on at least 4 facilities of interest.  Once CSW is able to obtain patient's signed and completed FL-2 Form from Dr. Hillard Danker, patient's Primary Care Physician, CSW will fax the form to all facilities of interest, in an attempt to pursue bed offers.  CSW agreed to follow-up with Mrs. [name] again next week, on Tuesday, July 20, 2019, around 10:00am.  Danford Bad, BSW, MSW, LCSW  Licensed Clinical Social Worker  [facility]  [address]ailing [place] [address], [place], [place] [address] Physical Address[address], [place], [place] [address] Toll Free Main # [phone] Fax # [phone] Cell # [phone]  Office # [phone] [email or web address]

## 2019-07-15 NOTE — Patient Instructions (Signed)
Description   Take 3mg  daily except for 2mg  every Monday. Repeat INR in 2 week.

## 2019-07-20 ENCOUNTER — Other Ambulatory Visit: Payer: Self-pay | Admitting: *Deleted

## 2019-07-20 NOTE — Patient Outreach (Signed)
[facility]) Care Management  07/20/2019  [name] 1945-03-22 [id number]   CSW was able to make contact with patient's daughter and primary caregiver, [name] today, to follow-up regarding placement arrangements for patient.  CSW explained to [name] that CSW completed a new FL-2 Form on patient, as [place] is now requiring that all placement requests be submitted on the [employer] (Division of Medical Assistance) Long Term Care FL-2 Form.  CSW went on to explain to [name] that CSW has submitted this new FL-2 Form to patient's Primary Care Physician, Dr. Hillard Danker, for review and signature.  Once the completed and signed FL-2 Form is obtained from Dr. Okey Dupre, CSW will fax a copy to patient's Medicaid Case Worker, at the [facility], as they will need to file a copy with patient's Special Assistance Long-Term Care Medicaid application.  In addition, CSW has agreed to fax the completed and signed FL-2 Form to all facilities of interest, in pursuit of bed offers.  [name] admitted that she is still working on completing and submitting the Special Assistance Long-Term Care Medicaid application for patient, and hopes to be able to submit it to the [facility], within the next few days.  [name] further explained to CSW that she is still researching long-term care assisted living facilities, that provide memory care services and have the wander-guard system, and will provide a list of preferred facilities to CSW by the end of the week.  CSW voiced understanding and was agreeable to this plan.  CSW explained to [name] that if CSW does not receive a return call from her by Thursday, then CSW will make arrangements to contact [name] on Thursday, July 22, 2019, around 12:00pm.  CSW will also continue to try and make contact with Dr. Okey Dupre, or her nurse, to  inquire about patient's FL-2 Form.  Danford Bad, BSW, MSW, LCSW  Licensed Restaurant manager, fast food Health System  Mailing [place] [address] [place], [place] [address] Physical Address-[address], [place], [place] [address] Toll Free Main # [phone] Fax # [phone] Cell # [phone]  Office # [phone] [email or web address]

## 2019-07-20 NOTE — Patient Outreach (Signed)
[facility]) Care Management  07/20/2019  [name] [id number]   Call placed to member's daughter to follow up on management of health conditions and referral to social worker.  No answer, HIPAA compliant voice message left.  Will await call back, if no call back will follow up within the next 3-4 business days.    Update:  Call received back from daughter, confirms that she has been in contact with CSW regarding process for placement.  She has continued to manage member in the meantime.  She will complete the paperwork for placement and follow up with CSW.  Denies any urgent needs at this time, will follow up within the next month.  Goals Addressed              This Visit's Progress     Per daughter, have patient placed to provide increased level of care (pt-stated)        CARE PLAN ENTRY (see longitudinal plan of care for additional care plan information)  Current Barriers:   Care Coordination needs related to adequate support in a patient with dementia (disease states)  Nurse Case Manager Clinical Goal(s):   Over the next 28 days, patient will work with [facility] to address needs related to management of dementia  Over the next 28 days, patient will attend all scheduled medical appointments: with PCP and all other providers  Over the next 31 days, patient will work with CM clinical social worker to have member placed  Interventions:   Inter-disciplinary care team collaboration (see longitudinal plan of care)  Provided education to patient re: management of dementia  Collaborated with CSW regarding placement  Discussed plans with patient for ongoing care management follow up and provided patient with direct contact information for care management team  Patient Self Care Activities:   Attends all scheduled provider appointments  Calls provider office for new concerns or questions  Unable to self administer medications as  prescribed  Unable to perform IADLs independently  Initial goal documentation         [name], RN, MSN [facility] Care Management  [facility] Care Manager [phone]

## 2019-07-21 ENCOUNTER — Other Ambulatory Visit: Payer: Self-pay | Admitting: *Deleted

## 2019-07-21 NOTE — Patient Outreach (Signed)
[facility]) Care Management  07/21/2019  [name] 17-Sep-1945 [id number]   CSW was able to make contact with patient's daughter, [name] today to follow-up regarding placement arrangements for patient.  CSW explained to Mrs. [name] that CSW received patient's completed and signed FL-2 Form from Dr. Hillard Danker, patient's Primary Care Physician, and that CSW has already emailed a copy of the form to Mrs. [name].  Mrs. [name] confirmed receipt.  CSW encouraged [name] to provide a copy to patient's Medicaid case worker, with the [employer] [facility], once assigned.  Mrs. [name] admitted that she is still working on the application for Special Assistance Long-Term Care Medicaid for patient, hoping to be able to submit the application for processing, at least by the end of the week.    CSW encouraged [name] to go ahead and submit a copy of the FL-2 Form, along with the application, hoping this will speed up the approval/denial process.  CSW also encouraged [name] to provide CSW with a list of assisted living facilities of interest, having already provided [name] with a complete list of assisted living facilities in [facility] that provide memory care services and have the wander-guard system.  Mrs. [name] voiced understanding and was agreeable to this plan.  CSW explained to Mrs. [name] that CSW will be out-of-the-office next week, but that CSW will leave detailed information and instructions with CSW's colleague, covering in CSW's absence.  Otherwise, CSW will make arrangements to follow-up with Mrs. [name] on Tuesday, August 03, 2019, around 9:00am.  Danford Bad, BSW, MSW, LCSW  Licensed Clinical Social Worker  [facility]  [address] [place] [address] [place], [place] [address] Physical Address[address], [place], [place] [address] Toll Free  Main # [phone] Fax # [phone] Cell # [phone]  Office # [phone] [email or web address]

## 2019-07-22 ENCOUNTER — Ambulatory Visit: Payer: Self-pay | Admitting: *Deleted

## 2019-07-27 ENCOUNTER — Telehealth: Payer: Self-pay | Admitting: Internal Medicine

## 2019-07-27 ENCOUNTER — Other Ambulatory Visit: Payer: Self-pay | Admitting: Internal Medicine

## 2019-07-27 ENCOUNTER — Other Ambulatory Visit: Payer: Self-pay | Admitting: Pharmacist

## 2019-07-27 MED ORDER — PREDNISONE 20 MG PO TABS: 40.0000 mg | ORAL_TABLET | Freq: Every day | ORAL | 0 refills | Status: DC

## 2019-07-27 NOTE — Telephone Encounter (Signed)
  Patients daughter calling to report patient is having a gout flare up.     1.Medication Requested: predniSONE (DELTASONE) 20 MG tablet  2. Pharmacy (Name, Street, [place]): [facility] - [place], [place] - [address] [place]  3. On Med List: yes  4. Last Visit with PCP: 07/02/19 video visit  5. Next visit date with PCP: 08/06/19   Agent: Please be advised that RX refills may take up to 3 business days. We ask that you follow-up with your pharmacy.

## 2019-07-27 NOTE — Telephone Encounter (Signed)
Dr. Rennis Golden refilled for the patient today; please make sure daughter is aware so they can get the Rx if they haven't already.

## 2019-07-27 NOTE — Telephone Encounter (Signed)
Please advise in PCPs absence. Thank you! 

## 2019-07-28 NOTE — Telephone Encounter (Signed)
Called pt's daughter, [place], LVM.

## 2019-07-30 ENCOUNTER — Ambulatory Visit (INDEPENDENT_AMBULATORY_CARE_PROVIDER_SITE_OTHER): Payer: Medicare Other | Admitting: Pharmacist

## 2019-07-30 DIAGNOSIS — Z952 Presence of prosthetic heart valve: Secondary | ICD-10-CM

## 2019-07-30 DIAGNOSIS — Z7901 Long term (current) use of anticoagulants: Secondary | ICD-10-CM | POA: Diagnosis not present

## 2019-07-30 LAB — POCT INR: INR: 3.5 — AB (ref 2.0–3.0)

## 2019-08-03 ENCOUNTER — Other Ambulatory Visit: Payer: Self-pay | Admitting: Internal Medicine

## 2019-08-03 ENCOUNTER — Other Ambulatory Visit: Payer: Self-pay | Admitting: *Deleted

## 2019-08-03 NOTE — Patient Outreach (Signed)
[facility]) Care Management  08/03/2019  [name] 16-Oct-1945 [id number]   CSW was able to make contact with patient's daughter, [name] today to follow-up regarding placement arrangements for patient.  CSW inquired as to whether or not [name] has had an opportunity to submit patient's Special Assistance Long-Term Care Medicaid application to the [facility] Department of Social Services for processing.  [name] denied, admitting that she is still trying to get all of the necessary paperwork together, required with submission of the application.  [name] indicated that she hopes to be able to submit the application within the next week.  CSW reminded [name] to also include the new completed and signed FL-2 Form, emailed to her by CSW on Wednesday, July 21, 2019, with the application.     CSW also inquired as to whether or not [name] has been able to research long-term care assisted living facilities, with memory care services and the wander guard system, from the list provided to her by CSW on Wednesday, July 07, 2019.  More specifically, CSW requested that [name] provide CSW with at least 4 facilities of interest, so that CSW can fax patient's FL-2 Form to these facilities to try and pursue bed offers.  [name] agreed to provide CSW with 4 facilities of choice, during our next scheduled phone conversation, which will take place next week, on Tuesday, August 10, 2019, around 9:00am, per [name]'s request.  [name] is aware that patient's current FL-2 Form is only valid for 30 days before it expires and a new form will need to be completed.  [name], [name], [name], [name]  Licensed Restaurant manager, fast food Health System  Mailing [place] [address], [place], [place] [address] Physical Address[address], [place], [place] [address] Toll Free Main #  [phone] Fax # [phone] Cell # [phone]  Office # [phone] [name]

## 2019-08-06 ENCOUNTER — Ambulatory Visit: Payer: Medicare Other | Admitting: Internal Medicine

## 2019-08-06 DIAGNOSIS — Z0289 Encounter for other administrative examinations: Secondary | ICD-10-CM

## 2019-08-10 ENCOUNTER — Other Ambulatory Visit: Payer: Self-pay | Admitting: *Deleted

## 2019-08-10 NOTE — Patient Outreach (Signed)
[facility]) Care Management  08/10/2019  [name] July 15, 1945 [id number]   CSW was able to make contact with patient's daughter, [name] today, to follow-up regarding long-term care placement arrangements for patient into an assisted living facility with memory care services and the wander guard system.  CSW inquired as to whether or not [name] has had an opportunity to submit patient's application for Special Assistance Long-Term Care Medicaid, to the [facility] of Social Services, for which she denied.  [name] admitted that she is still working on the application, apologizing for not having submitted it for processing by now.  CSW reminded [name] that patient's FL-2 Form will expire after 30 days, with only 10 days remaining.  [name] voiced understanding, agreeing to try and get the application submitted by the end of the week.  CSW also reminded [name] that CSW needs for her to provide CSW with a list of assisted living facilities of interest, ones that accept Special Assistance Long-Term Care Medicaid, that also offer memory care services and have the wander guard system.  CSW went on to explain to [name] that CSW still needs to fax patient's FL-2 Form to these facilities of interest, to try and pursue bed offers.  [name] confirmed with CSW that she still has a copy of the list provided to her by CSW.  CSW will follow-up with [name] again next week, on Tuesday, August 17, 2019, around 10:00am.  Danford Bad, BSW, MSW, LCSW  Licensed Clinical Social Worker  [facility]  [address]ailing [place] [address] [place], [place] [address] Physical Address-[address], [place], [place] [address] Toll Free Main # [phone] Fax # [phone] Cell # [phone]  Office # [phone] [email or web address]

## 2019-08-11 ENCOUNTER — Other Ambulatory Visit: Payer: Self-pay | Admitting: *Deleted

## 2019-08-11 NOTE — Patient Outreach (Signed)
[facility]) Care Management  08/11/2019  Rendy Lazard 01-11-1945 [id number]   CSW received an Email message from patient's daughter, [name] today, requesting that CSW fax patient's completed and signed FL-2 Form to two assisted living facilities of choice.  These facilities include:  [facility], [place], [place] and [facility], [place], [place].  CSW faxed patient's FL-2 Form to both of these facilities, receiving confirmation of receipt.  CSW will follow-up with [name] again next week, on Tuesday, August 17, 2019, around 10:00am, as originally planned.  Danford Bad, BSW, MSW, LCSW  Licensed Restaurant manager, fast food Health System  Mailing [place] [address], [place], [place] [address] Physical Address[address], [place], [place] [address] Toll Free Main # [phone] Fax # [phone] Cell # [phone]  Office # [phone] [email or web address]

## 2019-08-12 ENCOUNTER — Other Ambulatory Visit: Payer: Self-pay | Admitting: Internal Medicine

## 2019-08-12 ENCOUNTER — Ambulatory Visit (INDEPENDENT_AMBULATORY_CARE_PROVIDER_SITE_OTHER): Payer: Medicare Other | Admitting: Pharmacist Clinician (PhC)/ Clinical Pharmacy Specialist

## 2019-08-12 DIAGNOSIS — Z952 Presence of prosthetic heart valve: Secondary | ICD-10-CM | POA: Diagnosis not present

## 2019-08-12 DIAGNOSIS — I4891 Unspecified atrial fibrillation: Secondary | ICD-10-CM

## 2019-08-12 DIAGNOSIS — Z7901 Long term (current) use of anticoagulants: Secondary | ICD-10-CM | POA: Diagnosis not present

## 2019-08-12 LAB — POCT INR: INR: 2 (ref 2.0–3.0)

## 2019-08-17 ENCOUNTER — Other Ambulatory Visit: Payer: Self-pay | Admitting: *Deleted

## 2019-08-17 NOTE — Patient Outreach (Signed)
[facility]) Care Management  08/17/2019  [name] 05-Aug-1945 [id number]   CSW was able to make contact with patient's daughter, [name] today, to follow-up regarding long-term care placement arrangements for patient into an assisted living facility that offers memory care services and has the wander guard system.  CSW inquired as to whether or not [name] has received bed offers from [facility] or [facility].  [name] denied, but admitted that she is still working on completing patient's application for Special Assistance Long-Term Care Medicaid, still needing to submit it to the [facility] of Social Services for processing.  CSW reminded [name] that CSW faxed patient's FL-2 form to the two above named facilities of choice, per her request, on Wednesday, August 11, 2019, confirming receipt with the admissions coordinators at both facilities.  CSW went on to explain to [name] that both of the admissions coordinators requested a payor source for patient, for which CSW replied that patient's daughter is applying for Special Assistance Long-Term Care Medicaid.  [name] has been encouraged to contact both admissions coordinators once the application has been submitted, as they agreed to follow-up with patient's assigned Medicaid case worker to discuss payment arrangements.  [name] voiced understanding, acknowledging that patient's current FL-2 Form will expire within the next week, requiring CSW to complete a new form and submit it to patient's Primary Care Physician, Dr. Hillard Danker for review and signature.  CSW agreed to follow-up with [name] again next week, on Thursday, August 26, 2019, around 10:00am, to inquire about patient's application for Special Assistance Long-Term Care Medicaid, offering assistance with application  completion.  CSW encouraged [name] to contact CSW if anything transpires in the meantime, confirming that [name] has the correct contact information for CSW.  Danford Bad, BSW, MSW, LCSW  Licensed Restaurant manager, fast food [facility]  Mailing [place] [address], [place], [place] [address] Physical Address-[address], [place], [place] [address] Toll Free Main # [phone] Fax # [phone] Cell # [phone]  Office # [phone] [email or web address]

## 2019-08-24 ENCOUNTER — Other Ambulatory Visit: Payer: Self-pay | Admitting: Internal Medicine

## 2019-08-25 ENCOUNTER — Other Ambulatory Visit: Payer: Self-pay | Admitting: *Deleted

## 2019-08-25 NOTE — Patient Outreach (Signed)
[facility]) Care Management  08/25/2019  Renesha Lizama 09/25/1945 [id number]   Call placed to member's daughter [name] to follow up on member's current health status.  No answer, HIPAA compliant voice message left, will follow up within the next 3-4 business days.     Update:  Incoming call received from daughter, state member is "about the same."  She is still working with CSW to have her placed.  State she toured a facility yesterday and will have another tour for a different facility on Friday.  She has completed paperwork for long term Medicaid, CSW will help to renew FL2 when needed.  Daughter denies any urgent concerns at this time.  Although the dementia is progressing, member has been medically stable.  Will follow up with daughter within the next month.  Goals Addressed              This Visit's Progress   .  Per daughter, have patient placed to provide increased level of care (pt-stated)   On track     CARE PLAN ENTRY (see longitudinal plan of care for additional care plan information)  Current Barriers:  . Care Coordination needs related to adequate support in a patient with dementia (disease states)  Nurse Case Manager Clinical Goal(s):  Marland Kitchen Over the next 28 days, patient will work with [facility] to address needs related to management of dementia . Over the next 28 days, patient will attend all scheduled medical appointments: with PCP and all other providers . Over the next 31 days, patient will work with CM clinical social worker to have member placed  Interventions:  . Inter-disciplinary care team collaboration (see longitudinal plan of care) . Provided education to patient re: management of dementia . Collaborated with CSW regarding placement . Discussed plans with patient for ongoing care management follow up and provided patient with direct contact information for care management team  Patient Self Care Activities:  . Attends all  scheduled provider appointments . Calls provider office for new concerns or questions . Unable to self administer medications as prescribed . Unable to perform IADLs independently  Initial goal documentation       [name], RN, MSN [facility] Care Management  [facility] Manager [phone]

## 2019-08-26 ENCOUNTER — Other Ambulatory Visit: Payer: Self-pay | Admitting: *Deleted

## 2019-08-26 ENCOUNTER — Ambulatory Visit (INDEPENDENT_AMBULATORY_CARE_PROVIDER_SITE_OTHER): Payer: Medicare Other | Admitting: Cardiology

## 2019-08-26 DIAGNOSIS — Z952 Presence of prosthetic heart valve: Secondary | ICD-10-CM

## 2019-08-26 LAB — POCT INR: INR: 2.4 (ref 2.0–3.0)

## 2019-08-26 NOTE — Patient Outreach (Signed)
[facility]) Care Management  08/26/2019  [name] December 21, 1945 [id number]   CSW was able to make contact with patient's daughter, [name] today, to follow-up regarding social work services and resources for patient, as well as to check the status of long-term care placement arrangements for patient into an assisted living facility, offering memory care services and the wander guard system.  According to Mrs. [name], she went to tour an assisted living facility on Tuesday, August 24, 2019, and is scheduled to tour another assisted living facility on Friday, August 27, 2019.  CSW voiced understanding, explaining to Mrs. [name] that CSW will need to go ahead and request that patient's Primary Care Physician, Dr. Hillard Rodriguez sign a new FL-2 Form on patient, as the previous form has now expired.    CSW has faxed the new FL-2 Form to Dr. Okey Rodriguez, requesting that she review, sign and date the form and fax back to CSW at her earliest convenience.  CSW has already faxed patient's FL-2 Form to [facility] and [facility].  Mrs. [name] denied having received a bed offer from either facility, as of yet.  Mrs. [name] admitted that she is continuing to work on completing patient's Special Assistance Long-Term Care Medicaid application, hoping to get it submitted to the [facility] of Social Services by Friday, August 27, 2019.  CSW will follow-up with Mrs. [name] again next week, on Thursday, September 01, 2019, around 10:00am.  Danford Bad, BSW, MSW, LCSW  Licensed Clinical Social Worker  [facility]  Mailing [place] [address], [place], [place] [address] Physical Address-[address], [place], [place] [address] Toll Free Main # [phone] Fax # [phone] Cell # [phone]  Office #  [phone] [email or web address]

## 2019-08-31 ENCOUNTER — Ambulatory Visit: Payer: Medicare Other | Admitting: Neurology

## 2019-09-02 ENCOUNTER — Other Ambulatory Visit: Payer: Self-pay | Admitting: *Deleted

## 2019-09-02 NOTE — Patient Outreach (Signed)
[facility]) Care Management  09/02/2019  [name] [birth year] [id number]    CSW was able to make contact with patient's daughter and primary caregiver, [name] today, to follow-up regarding social work services and resources for patient.  CSW was also able to check the status of patient's long-term care placement arrangements.  [name] admitted that she is still touring assisted living facilities that offer memory care services and the wander guard system, but has yet to find a facility that is suitable for patient, or able to accommodate all of her medical needs.  [name] indicated that she has recently stopped scheduling tours at assisted living facilities, no longer able to step foot into a facility, due to the recent mandatory shut down in response to the new COVID-19 Variant outbreak.    CSW voiced understanding, agreeing to follow-up with [name] every two and a half weeks, on Tuesday, September 21, 2019, around 9:00am, as opposed to every week, at least until she is able to begin touring assisted living facilities again in an attempt to pursue placement arrangements for patient.  [name] admitted that she recently submitted patient's application for Special Assistance Long-Term Care Medicaid to the [facility] Department of Social Services for processing.  [name] is aware that it can take 45-60 days to process the application before she will receive an approval/denial letter in the mail.  [name] has been encouraged to contact CSW directly if additional social work needs arise within the next few weeks, confirming that she has the correct contact information for CSW.    Danford Bad, BSW, MSW, LCSW  Licensed Restaurant manager, fast food [facility]  Mailing [place] [address], [place], [place] [address] Physical Address[address] [place], [place] [address] Toll Free  Main # [phone] Fax # [phone] Cell # [phone]  [email or web address]

## 2019-09-03 ENCOUNTER — Ambulatory Visit: Payer: Medicare Other | Admitting: Internal Medicine

## 2019-09-09 ENCOUNTER — Ambulatory Visit (INDEPENDENT_AMBULATORY_CARE_PROVIDER_SITE_OTHER): Payer: Medicare Other | Admitting: Cardiology

## 2019-09-09 DIAGNOSIS — Z952 Presence of prosthetic heart valve: Secondary | ICD-10-CM

## 2019-09-09 LAB — POCT INR: INR: 3.1 — AB (ref 2.0–3.0)

## 2019-09-14 ENCOUNTER — Other Ambulatory Visit: Payer: Self-pay | Admitting: *Deleted

## 2019-09-14 ENCOUNTER — Other Ambulatory Visit: Payer: Self-pay

## 2019-09-14 ENCOUNTER — Other Ambulatory Visit: Payer: Self-pay | Admitting: Internal Medicine

## 2019-09-14 ENCOUNTER — Encounter: Payer: Self-pay | Admitting: *Deleted

## 2019-09-14 ENCOUNTER — Other Ambulatory Visit: Payer: Self-pay | Admitting: Cardiology

## 2019-09-14 NOTE — Patient Outreach (Signed)
[facility]) Care Management  09/14/2019  [name] 11-May-1945 [id number]   CSW received an incoming call from patient's daughter and primary caregiver, Vonna Kotyk today, indicating that she is wanting to hold off on pursuing long-term care placement arrangements for patient into an assisted living facility for now.  [name] went on to explain that she has not been able to find a suitable assisted living facility that offers memory care services and has the wander guard system, definitely wanting to continue with her search.  [name] further explained that she is not even able to tour facilities of interest right now, as all assisted living facilities have shut down again, due to the new COVID-19 [place] Variant.  [name] stated, "I can certainly call you when I am ready to start again, I hate you having to call me each week and I never have anything new to report".  CSW voiced understanding, explaining to [name] that CSW just did not want to seem like a nuisance.  CSW was able to confirm that [name] has the correct contact information for CSW, encouraging her to contact CSW directly when she is ready to begin pursuing long-term care placement arrangements again.  [name] was agreeable to this plan, indicating that she is still waiting for patient to be approved/denied for [place] Medicaid.    [name] is aware that CSW will need to obtain a new FL-2 Form on patient, prior to pursing placement again.  CSW will perform a case closure on patient, as all goals of treatment have been met from social work standpoint and no additional social work needs have been identified at this time.  CSW will notify patient's RNCM with [facility], Valente David of CSW's plans to close patient's case.  CSW will fax an update to patient's Primary Care Physician, Dr. Pricilla Holm to ensure that she  is aware of CSW's involvement with patient's plan of care, as well as send a Physician Case Closure Letter.    Nat Christen, BSW, MSW, LCSW  Licensed Education officer, environmental [facility]  [place]ailing [place] [address] [place], [place] [address] Physical Address-[address] [place], [place] [address] Toll Free Main # [phone] Fax # [phone] Cell # [phone]  [email or web address]

## 2019-09-21 ENCOUNTER — Ambulatory Visit: Payer: Medicare Other | Admitting: *Deleted

## 2019-09-22 ENCOUNTER — Telehealth: Payer: Self-pay

## 2019-09-22 ENCOUNTER — Other Ambulatory Visit: Payer: Self-pay | Admitting: *Deleted

## 2019-09-22 NOTE — Telephone Encounter (Signed)
lmom for overdur inr

## 2019-09-22 NOTE — Patient Outreach (Signed)
[facility]) Care Management  09/22/2019  [name] Jul 08, 1945 [id number]   Call placed to member's daughter to follow up on management of chronic medical conditions and progress with placement, no answer.  HIPAA compliant voice message left, will follow up within the next 3-4 business days.  Per CSW note, daughter has decided to put placement of member to ALF/memory care unit on hold at this time.  Will discuss further with daughter once call is successful.  [name], [place], MSN [facility] Care Management  [facility] Manager [phone]

## 2019-09-23 ENCOUNTER — Ambulatory Visit (INDEPENDENT_AMBULATORY_CARE_PROVIDER_SITE_OTHER): Payer: Medicare Other | Admitting: Internal Medicine

## 2019-09-23 DIAGNOSIS — Z952 Presence of prosthetic heart valve: Secondary | ICD-10-CM

## 2019-09-23 LAB — POCT INR: INR: 2.4 (ref 2.0–3.0)

## 2019-09-28 ENCOUNTER — Other Ambulatory Visit: Payer: Self-pay | Admitting: *Deleted

## 2019-09-28 NOTE — Patient Outreach (Signed)
[facility]) Care Management  09/28/2019  Nylia Gavina 12-Jan-1945 [id number]   Outreach attempt #2, successful.  Call placed to member's daughter to follow up on management of chronic medical conditions and progress with placement.  Per CSW note, daughter has decided to put placement of member to ALF/memory care unit on hold at this time.  Daughter confirms that she is putting the search on hold.  State she was able to find one facility that she was pleased with but they didn't have an beds available.  She has yet to find another one that she is comfortable with.  Acknowledges that member's dementia is continuing to progress but her physical state remains stable.  Denies any further needs from this care manager, agrees to contact [facility] when she is ready to restart process.  Will close case at this time.  Goals Addressed              This Visit's Progress   .  COMPLETED: Per daughter, have patient placed to provide increased level of care (pt-stated)   Not on track     CARE PLAN ENTRY (see longitudinal plan of care for additional care plan information)  Current Barriers:  . Care Coordination needs related to adequate support in a patient with dementia (disease states)  Nurse Case Manager Clinical Goal(s):  Marland Kitchen Over the next 28 days, patient will work with [facility] to address needs related to management of dementia . Over the next 28 days, patient will attend all scheduled medical appointments: with PCP and all other providers . Over the next 31 days, patient will work with CM clinical social worker to have member placed  Interventions:  . Inter-disciplinary care team collaboration (see longitudinal plan of care) . Provided education to patient re: management of dementia . Collaborated with CSW regarding placement . Discussed plans with patient for ongoing care management follow up and provided patient with direct contact information for care management  team  Patient Self Care Activities:  . Attends all scheduled provider appointments . Calls provider office for new concerns or questions . Unable to self administer medications as prescribed . Unable to perform IADLs independently  UPDATE:  Search for placement has been placed on hold         [name], Charity fundraiser, MSN [facility] Care Management  [facility] Manager [phone]

## 2019-10-05 ENCOUNTER — Telehealth: Payer: Medicare Other | Admitting: Internal Medicine

## 2019-10-07 ENCOUNTER — Ambulatory Visit (INDEPENDENT_AMBULATORY_CARE_PROVIDER_SITE_OTHER): Payer: Medicare Other | Admitting: Cardiology

## 2019-10-07 DIAGNOSIS — Z952 Presence of prosthetic heart valve: Secondary | ICD-10-CM | POA: Diagnosis not present

## 2019-10-07 LAB — POCT INR: INR: 2.1 (ref 2.0–3.0)

## 2019-10-25 ENCOUNTER — Other Ambulatory Visit: Payer: Self-pay | Admitting: Internal Medicine

## 2019-10-26 ENCOUNTER — Other Ambulatory Visit: Payer: Self-pay | Admitting: Internal Medicine

## 2019-10-26 ENCOUNTER — Other Ambulatory Visit: Payer: Self-pay

## 2019-10-26 MED ORDER — FUROSEMIDE 20 MG PO TABS
ORAL_TABLET | ORAL | 1 refills | Status: DC
Start: 2019-10-26 — End: 2020-02-25

## 2019-10-28 ENCOUNTER — Ambulatory Visit (INDEPENDENT_AMBULATORY_CARE_PROVIDER_SITE_OTHER): Payer: Medicare Other | Admitting: Cardiovascular Disease

## 2019-10-28 DIAGNOSIS — Z952 Presence of prosthetic heart valve: Secondary | ICD-10-CM

## 2019-10-28 LAB — POCT INR: INR: 2.5 (ref 2.0–3.0)

## 2019-11-11 ENCOUNTER — Ambulatory Visit (INDEPENDENT_AMBULATORY_CARE_PROVIDER_SITE_OTHER): Payer: Medicare Other | Admitting: Cardiology

## 2019-11-11 DIAGNOSIS — Z952 Presence of prosthetic heart valve: Secondary | ICD-10-CM

## 2019-11-11 LAB — POCT INR: INR: 3 (ref 2.0–3.0)

## 2019-11-26 ENCOUNTER — Other Ambulatory Visit: Payer: Self-pay | Admitting: Internal Medicine

## 2019-11-26 ENCOUNTER — Other Ambulatory Visit: Payer: Self-pay | Admitting: Cardiology

## 2019-12-07 ENCOUNTER — Other Ambulatory Visit: Payer: Self-pay | Admitting: Internal Medicine

## 2019-12-08 LAB — POCT INR: INR: 3.4 — AB (ref 2.0–3.0)

## 2019-12-09 ENCOUNTER — Ambulatory Visit (INDEPENDENT_AMBULATORY_CARE_PROVIDER_SITE_OTHER): Payer: Medicare Other | Admitting: Cardiovascular Disease

## 2019-12-09 DIAGNOSIS — Z952 Presence of prosthetic heart valve: Secondary | ICD-10-CM

## 2019-12-14 ENCOUNTER — Other Ambulatory Visit: Payer: Self-pay | Admitting: Internal Medicine

## 2019-12-21 ENCOUNTER — Other Ambulatory Visit: Payer: Self-pay | Admitting: Internal Medicine

## 2019-12-22 LAB — POCT INR: INR: 3.2 — AB (ref 2.0–3.0)

## 2019-12-23 ENCOUNTER — Ambulatory Visit (INDEPENDENT_AMBULATORY_CARE_PROVIDER_SITE_OTHER): Payer: Medicare Other | Admitting: Pharmacist

## 2019-12-23 DIAGNOSIS — Z7901 Long term (current) use of anticoagulants: Secondary | ICD-10-CM | POA: Diagnosis not present

## 2019-12-23 DIAGNOSIS — Z953 Presence of xenogenic heart valve: Secondary | ICD-10-CM

## 2019-12-23 DIAGNOSIS — I4891 Unspecified atrial fibrillation: Secondary | ICD-10-CM

## 2019-12-29 LAB — POCT INR: INR: 4.6 — AB (ref 2.0–3.0)

## 2019-12-30 ENCOUNTER — Ambulatory Visit (INDEPENDENT_AMBULATORY_CARE_PROVIDER_SITE_OTHER): Payer: Medicare Other | Admitting: Cardiovascular Disease

## 2019-12-30 DIAGNOSIS — Z7901 Long term (current) use of anticoagulants: Secondary | ICD-10-CM | POA: Diagnosis not present

## 2019-12-30 DIAGNOSIS — I4891 Unspecified atrial fibrillation: Secondary | ICD-10-CM | POA: Diagnosis not present

## 2019-12-30 DIAGNOSIS — Z952 Presence of prosthetic heart valve: Secondary | ICD-10-CM

## 2019-12-30 DIAGNOSIS — Z953 Presence of xenogenic heart valve: Secondary | ICD-10-CM

## 2020-01-04 ENCOUNTER — Other Ambulatory Visit: Payer: Self-pay | Admitting: Cardiology

## 2020-01-05 LAB — POCT INR: INR: 2.6 (ref 2.0–3.0)

## 2020-01-06 ENCOUNTER — Ambulatory Visit (INDEPENDENT_AMBULATORY_CARE_PROVIDER_SITE_OTHER): Payer: Medicare Other | Admitting: Pharmacist Clinician (PhC)/ Clinical Pharmacy Specialist

## 2020-01-06 DIAGNOSIS — I4891 Unspecified atrial fibrillation: Secondary | ICD-10-CM | POA: Diagnosis not present

## 2020-01-06 DIAGNOSIS — Z952 Presence of prosthetic heart valve: Secondary | ICD-10-CM

## 2020-01-06 DIAGNOSIS — Z7901 Long term (current) use of anticoagulants: Secondary | ICD-10-CM

## 2020-01-13 ENCOUNTER — Ambulatory Visit (INDEPENDENT_AMBULATORY_CARE_PROVIDER_SITE_OTHER): Payer: Medicare Other | Admitting: Pharmacist Clinician (PhC)/ Clinical Pharmacy Specialist

## 2020-01-13 DIAGNOSIS — Z7901 Long term (current) use of anticoagulants: Secondary | ICD-10-CM

## 2020-01-13 DIAGNOSIS — I4891 Unspecified atrial fibrillation: Secondary | ICD-10-CM

## 2020-01-13 DIAGNOSIS — Z952 Presence of prosthetic heart valve: Secondary | ICD-10-CM

## 2020-01-13 LAB — POCT INR: INR: 2.2 (ref 2.0–3.0)

## 2020-01-27 ENCOUNTER — Ambulatory Visit (INDEPENDENT_AMBULATORY_CARE_PROVIDER_SITE_OTHER): Payer: Medicare Other | Admitting: Pharmacist Clinician (PhC)/ Clinical Pharmacy Specialist

## 2020-01-27 DIAGNOSIS — I4891 Unspecified atrial fibrillation: Secondary | ICD-10-CM | POA: Diagnosis not present

## 2020-01-27 DIAGNOSIS — Z952 Presence of prosthetic heart valve: Secondary | ICD-10-CM | POA: Diagnosis not present

## 2020-01-27 DIAGNOSIS — Z7901 Long term (current) use of anticoagulants: Secondary | ICD-10-CM

## 2020-01-27 LAB — POCT INR: INR: 2.7 (ref 2.0–3.0)

## 2020-02-02 ENCOUNTER — Other Ambulatory Visit: Payer: Self-pay | Admitting: Cardiology

## 2020-02-04 ENCOUNTER — Telehealth: Payer: Medicare Other | Admitting: Internal Medicine

## 2020-02-10 ENCOUNTER — Ambulatory Visit (INDEPENDENT_AMBULATORY_CARE_PROVIDER_SITE_OTHER): Payer: Medicare Other | Admitting: Pharmacist Clinician (PhC)/ Clinical Pharmacy Specialist

## 2020-02-10 DIAGNOSIS — I4891 Unspecified atrial fibrillation: Secondary | ICD-10-CM

## 2020-02-10 DIAGNOSIS — Z952 Presence of prosthetic heart valve: Secondary | ICD-10-CM

## 2020-02-10 DIAGNOSIS — Z7901 Long term (current) use of anticoagulants: Secondary | ICD-10-CM | POA: Diagnosis not present

## 2020-02-10 LAB — POCT INR: INR: 3.7 — AB (ref 2.0–3.0)

## 2020-02-17 ENCOUNTER — Ambulatory Visit (INDEPENDENT_AMBULATORY_CARE_PROVIDER_SITE_OTHER): Payer: Medicare Other | Admitting: Cardiovascular Disease

## 2020-02-17 DIAGNOSIS — Z952 Presence of prosthetic heart valve: Secondary | ICD-10-CM

## 2020-02-17 LAB — POCT INR: INR: 3.1 — AB (ref 2.0–3.0)

## 2020-02-24 ENCOUNTER — Ambulatory Visit (INDEPENDENT_AMBULATORY_CARE_PROVIDER_SITE_OTHER): Payer: Medicare Other

## 2020-02-24 DIAGNOSIS — Z7901 Long term (current) use of anticoagulants: Secondary | ICD-10-CM | POA: Diagnosis not present

## 2020-02-24 DIAGNOSIS — I4891 Unspecified atrial fibrillation: Secondary | ICD-10-CM

## 2020-02-24 DIAGNOSIS — Z953 Presence of xenogenic heart valve: Secondary | ICD-10-CM

## 2020-02-24 LAB — POCT INR: INR: 3.6 — AB (ref 2.0–3.0)

## 2020-02-25 ENCOUNTER — Other Ambulatory Visit: Payer: Self-pay | Admitting: Internal Medicine

## 2020-03-02 ENCOUNTER — Ambulatory Visit (INDEPENDENT_AMBULATORY_CARE_PROVIDER_SITE_OTHER): Payer: Medicare Other | Admitting: Cardiovascular Disease

## 2020-03-02 DIAGNOSIS — Z952 Presence of prosthetic heart valve: Secondary | ICD-10-CM | POA: Diagnosis not present

## 2020-03-02 LAB — POCT INR: INR: 2.2 (ref 2.0–3.0)

## 2020-03-09 ENCOUNTER — Ambulatory Visit (INDEPENDENT_AMBULATORY_CARE_PROVIDER_SITE_OTHER): Payer: Medicare Other | Admitting: Cardiovascular Disease

## 2020-03-09 DIAGNOSIS — Z952 Presence of prosthetic heart valve: Secondary | ICD-10-CM | POA: Diagnosis not present

## 2020-03-09 LAB — POCT INR: INR: 2.2 (ref 2.0–3.0)

## 2020-03-10 ENCOUNTER — Telehealth (INDEPENDENT_AMBULATORY_CARE_PROVIDER_SITE_OTHER): Payer: Medicare Other | Admitting: Internal Medicine

## 2020-03-10 ENCOUNTER — Encounter: Payer: Self-pay | Admitting: Internal Medicine

## 2020-03-10 VITALS — Wt 128.0 lb

## 2020-03-10 DIAGNOSIS — I4891 Unspecified atrial fibrillation: Secondary | ICD-10-CM

## 2020-03-10 DIAGNOSIS — E785 Hyperlipidemia, unspecified: Secondary | ICD-10-CM | POA: Diagnosis not present

## 2020-03-10 DIAGNOSIS — Z953 Presence of xenogenic heart valve: Secondary | ICD-10-CM

## 2020-03-10 DIAGNOSIS — Z7901 Long term (current) use of anticoagulants: Secondary | ICD-10-CM | POA: Diagnosis not present

## 2020-03-10 NOTE — Progress Notes (Signed)
Virtual Visit via Video Note   This visit type was conducted due to national recommendations for restrictions regarding the COVID-19 Pandemic (e.g. social distancing) in an effort to limit this patient's exposure and mitigate transmission in our community.  Due to her co-morbid illnesses, this patient is at least at moderate risk for complications without adequate follow up.  This format is felt to be most appropriate for this patient at this time.  The patient does have access to video technology.  All issues noted in this document were discussed and addressed.  Limited physical exam was performed.  Please refer to the patient's chart for her  consent to telehealth for [facility].   Date:  03/10/2020   ID:  [name], DOB 06-17-1945, MRN [id number] The patient was identified using 2 identifiers.  Evaluation Performed:  Follow-Up Visit  Patient Location:  [address] [place] [address]  Provider location:   [address], [address] [place], [place] [address]  PCP:  Myrlene Broker, MD  Cardiologist:  Chrystie Nose, MD Electrophysiologist:  None   Chief Complaint:  Caregility video follow-up  History of Present Illness:    [name] is a 75 y.o. female who presents via audio/video conferencing for a telehealth visit today. This is a pleasant female who unfortunately had a spontaneous valvular endocarditis after a pneumonia in 2000. She ultimately had severe mitral regurgitation and destruction of the mitral valve necessitating emergent replacement with a 29 mm mechanical St. Jude valve by Dr. Cornelius Moras. Since that time she's done very well. She continues to exercise and did not get any shortness of breath or chest pain. She also has dyslipidemia and hypertension which is well managed. She is chronically on warfarin therapy which is followed in this office.  Her last echocardiogram to evaluate valvular gradients was in 2011 which showed  an EF of  greater than 55%. The gradients across the valve were 9 and 4.9 mm Hg.  I saw [name] back in the office today. She is doing extremely well. She denies any shortness of breath or worsening chest pain. She continues to exercise. Her weight is appropriate. Carina was mildly elevated today and is followed by [name]. Her mitral valve apparently is working well. Her last echocardiogram in 2015 showed stable gradients and normal LV function. She is due for cholesterol reassessment. Of note her blood pressure is elevated today. She says that she's been out of her medicines for a little while and that there were no refills that were authorized, however our system indicates that they are available.  Mrs. [name] returns today for follow-up from her recent hospitalization. She was admitted for congestive heart failure. An echocardiogram showed an EF of 25-30%. This represents an acute decline from her preoperative EF which was normal this past summer. Unfortunately the summer she developed valve thrombosis and had to have a redo mitral valve surgery. She was doing fairly well for that for while and then developed worsening shortness of breath and was found to have cardiomyopathy. It's unclear whether her EF drop was perioperative course at some later time. She was diuresed and treated by the advanced heart failure service. She had a right heart catheterization and since her hospitalization has been doing better. She does not appear to have had recurrent atrial fibrillation and is been on amiodarone therapy as well as warfarin anticoagulation. She says she has had a productive cough for the last several weeks  after an upper respiratory infection but it is improving. Blood pressure today is noted to be very low at 94/72. Her weight is actually 5 pounds lower than her discharge weight.  05/19/2015  Mrs. [name] returns today for follow-up. Overall she's been doing fairly well. Blood pressure has  been running higher recently. Her hydralazine is at 50 mg 3 times a day. She's also on carvedilol 3125 twice a day however heart rate is in the low 60s. She appears euvolemic and her weight is been stable. She is taking amiodarone without recurrent A. fib. I had a discussion with her neurologist about her anticoagulation. She is interested in her going on to a novel oral anticoagulant, however I stressed the fact that there is little data and no FDA approval for use of those medications in patients with valvular A. fib. I would be concerned about making that switch. I feel that with better attempts at compliance we would be able to get more regulated INR levels. In addition, her bioprosthetic valve is much less likely at this point to thrombose (essentially the warfarin is for a-fib).  12/22/2015  Mrs. [name] was seen today in follow-up. She reports doing fairly well. Her daughter was accompanying her today and has been helping her with her medications. She's been placing her warfarin into her pill boxes and that is helped with compliance. INRs have recently been very well controlled. She does have valvular A. fib and as previously mentioned, the novel oral anticoagulants are not FDA approved for valvular atrial fibrillation. I would feel more comfortable if we continue with warfarin. She denies any worsening heart failure symptoms and remains on low-dose furosemide. Blood pressure was mildly elevated today however she did not take her morning medications. She is maintaining sinus rhythm and is had not recurrence of atrial fibrillation on low-dose amiodarone. She is due for repeat thyroid, liver function and pulmonary function tests.  08/30/2016  Mrs. [name] was seen today in follow-up. We're now checking her INRs every 2-3 weeks due to significant lability. There is been a history of memory loss and confusion and we suspect noncompliance with actually taking her medicines. Although the  medicines are laid out, it is not always confirmed that she is taking the pills. Unfortunately today her INR was very low 1.4. Her daughter mentioned that she had been staying with her son for the past week. Although overall she is asymptomatic and her asked echo was in February which showed a normal valve gradient and an improved ejection fraction is 60-65%. She was supposed to undergo dental surgery but has not yet had that. She will need antibiotic prophylaxis and is written for clindamycin.  05/01/2017  [name] returns today for follow-up.  Over the past year she is done well.  Today her INR was slightly low at 1.9.  Her daughter reports she has had some progressive memory loss.  Her echo last year showed stable valve gradients and normal LVEF.  She denies any chest pain or worsening shortness of breath.  She has been on some prednisone recently for gout.  She is now on allopurinol.  She also takes atorvastatin and is not had a repeat lipid profile since May 2018.  08/26/2018  Mrs. [name] is seen today in follow-up.  She is accompanied by her daughter and seems to be doing well.  She denies any chest pain or worsening shortness of breath.  She has been following her INRs at home which are then managed by pharmacist.  They have been stable and therapeutic.  Her blood pressure is well controlled today 126/74.  Weight is stable.  Heart rate is low in the 40s however at home tends to be a little higher.  She is asymptomatic with that.  She did have a repeat echo today.  I personally reviewed this although it has not been formalized, the LVEF is normal and her prosthetic valves appear to be functioning normally.  03/10/2020  [name]-Walker returns for follow-up.  Overall according to her daughter she seems to be doing well.  She denies any chest pain or worsening shortness of breath.  She has not had recent blood pressures taken but does take medication.  According to her daughter she is  unable to give her the midday hydralazine and I advised would be okay for her to take the hydralazine twice daily.  Her last echo was in 2020 which showed a low stable valve gradient.  This will not need to be routinely followed.  She will need to stay on spring in addition to warfarin for her valve.  She will need to continue with endocarditis prophylaxis.  She has not had recent lipids or renal function testing due to isolation from COVID-19.  The patient does not have symptoms concerning for COVID-19 infection (fever, chills, cough, or new SHORTNESS OF BREATH).    Prior CV studies:   The following studies were reviewed today:  Chart reviewed, labwork  PMHx:  Past Medical History:  Diagnosis Date  . Acute CHF (congestive heart failure) (HCC) 12/14/2014  . Hyperlipidemia   . Hypertension   . 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 S/P MVR (mitral valve replacement) 04/11/1998   #29 St. Jude bileaflet mechanical valve for bacterial endocarditis  . S/P redo mitral valve replacement with bioprosthetic valve 07/25/2014   27 mm [facility] Mitral bovine bioprosthetic tissue valve  . Stroke (HCC)   . 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 Valvular endocarditis 2000   Streptococcus    Past Surgical History:  Procedure Laterality Date  . ABDOMINAL HYSTERECTOMY    . CARDIAC CATHETERIZATION N/A 07/24/2014   Procedure: Right/Left Heart Cath and Coronary Angiography;  Surgeon: Iran Ouch, MD;  Location: [facility] INVASIVE CV LAB;  Service: Cardiovascular;  Laterality: N/A;  . CARDIAC CATHETERIZATION N/A 07/22/2014   Procedure: Fluoroscopy Guidance;  Surgeon: Lennette Bihari, MD;  Location: [facility] INVASIVE CV LAB;  Service: Cardiovascular;  Laterality: N/A;  . CARDIAC CATHETERIZATION N/A 12/16/2014   Procedure: Right Heart Cath;  Surgeon: Laurey Morale, MD;  Location: [facility] INVASIVE CV  LAB;  Service: Cardiovascular;  Laterality: N/A;  . CARDIAC VALVE REPLACEMENT    . MITRAL VALVE REPLACEMENT  04/11/1998   St. Jude mechanical MVR (severe MR, episode of streptococcal bacterial endocarditis - Dr. Cornelius Moras  . MITRAL VALVE REPLACEMENT N/A 07/25/2014   Procedure: REDO MITRAL VALVE REPLACEMENT (MVR);  Surgeon: Purcell Nails, MD;  Location: [facility] OR;  Service: Open Heart Surgery;  Laterality: N/A;  . TOTAL ABDOMINAL HYSTERECTOMY W/ BILATERAL SALPINGOOPHORECTOMY  12/14/1998  . TRANSTHORACIC ECHOCARDIOGRAM  11/2009   EF=>55%; bi-leaflet St. Jude mech prosthesis; mild TR, RVSP elevated at 40-68mmHg, mod pulm HTN; trace AV regurg; mild pulm valve regurg  . TUBAL LIGATION      FAMHx:  Family History  Problem Relation Age of Onset  . Lung cancer Mother   . Heart attack Mother   .  Heart failure Maternal Grandmother   . Heart failure Maternal Grandfather   . Stroke Maternal Grandfather   . Stroke Father   . Colon cancer Father   . Diabetes Father   . Stroke Sister   . Lung disease Neg Hx     SOCHx:   reports that she quit smoking about 47 years ago. Her smoking use included cigarettes. She smoked 0.10 packs per day. She has never used smokeless tobacco. She reports that she does not drink alcohol and does not use drugs.  ALLERGIES:  Allergies  Allergen Reactions  . Augmentin [Amoxicillin-Pot Clavulanate] Other (See Comments)    Unknown reaction Has patient had a PCN reaction causing immediate rash, facial/tongue/throat swelling, SOB or lightheadedness with hypotension Has patient had a PCN reaction causing severe rash involving mucus membranes or skin necrosis Has patient had a PCN reaction that required hospitalization  Has patient had a PCN reaction occurring within the last 10 years] If all of the above answers are "NO", then may proceed with Cephalosporin use.     MEDS:  Current Meds  Medication Sig  . allopurinol (ZYLOPRIM) 100 MG tablet TAKE 1 TABLET BY MOUTH ONCE DAILY   . aspirin EC 81 MG tablet Take 1 tablet (81 mg total) by mouth daily.  Marland Kitchen. atorvastatin (LIPITOR) 40 MG tablet TAKE 1 TABLET BY MOUTH ONCE A DAY  . carvedilol (COREG) 3.125 MG tablet TAKE 1 TABLET BY MOUTH TWO TIMES DAILY (NEEDS OFFICE VISIT)  . donepezil (ARICEPT) 10 MG tablet Take 1 tablet (10 mg total) by mouth at bedtime. OFFICE VISIT IS DUE  . furosemide (LASIX) 20 MG tablet TAKE 1 TABLET BY MOUTH ONCE A DAY.(KEEP APPT) PLEASE SCHEDULE AN APPT FOR FUTURE REFILLS  . hydrALAZINE (APRESOLINE) 50 MG tablet TAKE 1 AND 1/2 TABLETS BY MOUTH THREE TIMES DAILY  . isosorbide mononitrate (IMDUR) 30 MG 24 hr tablet TAKE 1 TABLET BY MOUTH DAILY.  . pantoprazole (PROTONIX) 40 MG tablet TAKE 1 TABLET BY MOUTH ONCE DAILY  . warfarin (COUMADIN) 2 MG tablet TAKE 1 TO 1 & 1/2 TABLETS BY MOUTH DAILY OR AS DIRECTED BY COUMADIN CLINIC     ROS: Pertinent items noted in HPI and remainder of comprehensive ROS otherwise negative.  Labs/Other Tests and Data Reviewed:    Recent Labs: No results found for requested labs within last 8760 hours.   Recent Lipid Panel Lab Results  Component Value Date/Time   CHOL 169 08/26/2018 11:57 AM   TRIG 53 08/26/2018 11:57 AM   HDL 89 08/26/2018 11:57 AM   CHOLHDL 1.9 08/26/2018 11:57 AM   CHOLHDL 2 05/23/2017 10:33 AM   LDLCALC 69 08/26/2018 11:57 AM    Wt Readings from Last 3 Encounters:  03/10/20 128 lb (58.1 kg)  08/26/18 124 lb 12.8 oz (56.6 kg)  12/22/17 124 lb (56.2 kg)     Exam:    Vital Signs:  Wt 128 lb (58.1 kg)   LMP  (LMP Unknown)   BMI 21.97 kg/m    General appearance: alert and no distress Lungs: no visual respiratory difficulty Abdomen: normal weight Extremities: extremities normal, atraumatic, no cyanosis or edema Skin: Skin color, texture, turgor normal. No rashes or lesions Neurologic: Mental status: Alert, oriented, thought content appropriate Psych: Pleasant  ASSESSMENT & PLAN:    1. History of 29 mm St. Jude mechanical mitral  valve replacement (2000) for SBE with valve thrombosis and subsequent re-do MVR with a 27 mm Edwards Magna-Ease bioprosthetic valve (07/2014) 2. Acute systolic  congestive heart failure-EF 25-30% (improved to 60-65% by echo in 02/2016) 3. HTN 4. Dyslipidemia 5. Paroxysmal atrial fibrillation- CHADSVASC score of 6 6. Chronic anticoagulation on warfarin 7. RBBB 8. Progressive memory loss  [name] seems to be doing well by accounts of her daughter.  Unfortunately she has had progressive memory loss however she has had no worsening shortness of breath, chest pain or concerns about atrial fibrillation.  More recently she had a slightly elevated INR is greater than 3.5 however they have now normalized.  Blood pressure was not available today and I have encouraged him to purchase a new cuff or be able to check it with some regularity.  Her cholesterol has not been checked since 2020 and neither has her renal function.  We will get a metabolic profile and lipid profile and plan annual follow-up or sooner as necessary.  She will not need regular echocardiograms based on current guidelines since her gradient after surgery was low and has remained stable.  She should continue endocarditis prophylaxis and low-dose aspirin in addition to warfarin.  COVID-19 Education: The signs and symptoms of COVID-19 were discussed with the patient and how to seek care for testing (follow up with PCP or arrange E-visit).  The importance of social distancing was discussed today.  Patient Risk:   After full review of this patients clinical status, I feel that they are at least moderate risk at this time.  Time:   Today, I have spent 25 minutes with the patient with telehealth technology discussing dyslipidemia, HTN, PAF, valvular heart disease, anticoagulation.     Medication Adjustments/Labs and Tests Ordered: Current medicines are reviewed at length with the patient today.  Concerns regarding medicines are outlined  above.   Tests Ordered: No orders of the defined types were placed in this encounter.   Medication Changes: No orders of the defined types were placed in this encounter.   Disposition:  in 1 year(s)  Chrystie Nose, MD, [facility], FACP  [place]  [facility]  Medical Director of the Advanced Lipid Disorders &  Cardiovascular Risk Reduction Clinic Diplomate of the American Board of Clinical Lipidology Attending Cardiologist  Direct Dial: [phone]  Fax: [phone]  Website:  [email or web address]  Chrystie Nose, MD  03/10/2020 8:16 AM

## 2020-03-10 NOTE — Patient Instructions (Signed)
Medication Instructions:  Your physician recommends that you continue on your current medications as directed. Please refer to the Current Medication list given to you today.  *If you need a refill on your cardiac medications before your next appointment, please call your pharmacy*   Lab Work: FASTING lipid panel & comprehensive metabolic panel   If you have labs (blood work) drawn today and your tests are completely normal, you will receive your results only by:  MyChart Message (if you have MyChart) OR  A paper copy in the mail If you have any lab test that is abnormal or we need to change your treatment, we will call you to review the results.   Testing/Procedures: NONE   Follow-Up: At [facility], you and your health needs are our priority.  As part of our continuing mission to provide you with exceptional heart care, we have created designated Provider Care Teams.  These Care Teams include your primary Cardiologist (physician) and Advanced Practice Providers (APPs -  Physician Assistants and Nurse Practitioners) who all work together to provide you with the care you need, when you need it.  We recommend signing up for the patient portal called "MyChart".  Sign up information is provided on this After Visit Summary.  MyChart is used to connect with patients for Virtual Visits (Telemedicine).  Patients are able to view lab/test results, encounter notes, upcoming appointments, etc.  Non-urgent messages can be sent to your provider as well.   To learn more about what you can do with MyChart, go to [email or web address].    Your next appointment:   12 month(s)  The format for your next appointment:   In Person  Provider:   You may see Chrystie Nose, MD or one of the following Advanced Practice Providers on your designated Care Team:    Azalee Course, PA-C  Micah Flesher, PA-C or   Judy Pimple, [place]    Other Instructions

## 2020-03-11 ENCOUNTER — Other Ambulatory Visit: Payer: Self-pay | Admitting: Cardiology

## 2020-03-13 ENCOUNTER — Telehealth: Payer: Self-pay

## 2020-03-13 ENCOUNTER — Other Ambulatory Visit: Payer: Self-pay | Admitting: Internal Medicine

## 2020-03-13 ENCOUNTER — Telehealth: Payer: Self-pay | Admitting: Internal Medicine

## 2020-03-13 NOTE — Telephone Encounter (Signed)
Daughter calling to report patient has gout flare up in right wrist, requesting order for [facility]

## 2020-03-13 NOTE — Telephone Encounter (Signed)
Called and spoke w/tiffany and stated that she would need to contact the pcp to get something for her mom's gout flare as we cannot prescribe anything for that. She voiced gratitude and understanding

## 2020-03-13 NOTE — Telephone Encounter (Signed)
Coumadin Pt's  daughter requesting an rx for a gout flare up will route to pharmd pool

## 2020-03-14 ENCOUNTER — Other Ambulatory Visit: Payer: Self-pay | Admitting: Internal Medicine

## 2020-03-14 MED ORDER — PREDNISONE 20 MG PO TABS: 40.0000 mg | ORAL_TABLET | Freq: Every day | ORAL | 0 refills | Status: DC

## 2020-03-14 NOTE — Telephone Encounter (Signed)
See below

## 2020-03-14 NOTE — Telephone Encounter (Signed)
Sent in

## 2020-03-18 ENCOUNTER — Other Ambulatory Visit: Payer: Self-pay | Admitting: Internal Medicine

## 2020-03-20 ENCOUNTER — Other Ambulatory Visit: Payer: Self-pay | Admitting: Internal Medicine

## 2020-03-22 ENCOUNTER — Telehealth: Payer: Self-pay | Admitting: Internal Medicine

## 2020-03-23 ENCOUNTER — Ambulatory Visit (INDEPENDENT_AMBULATORY_CARE_PROVIDER_SITE_OTHER): Payer: Medicare Other | Admitting: Internal Medicine

## 2020-03-23 ENCOUNTER — Other Ambulatory Visit: Payer: Self-pay | Admitting: Internal Medicine

## 2020-03-23 DIAGNOSIS — Z952 Presence of prosthetic heart valve: Secondary | ICD-10-CM

## 2020-03-23 DIAGNOSIS — Z7901 Long term (current) use of anticoagulants: Secondary | ICD-10-CM

## 2020-03-23 LAB — POCT INR: INR: 3.2 — AB (ref 2.0–3.0)

## 2020-03-23 MED ORDER — PREDNISONE 20 MG PO TABS: 40.0000 mg | ORAL_TABLET | Freq: Every day | ORAL | 2 refills | Status: DC

## 2020-03-23 NOTE — Patient Instructions (Addendum)
Description   Starting prednisone tonight.  Will hold 2 doses then resume 1.5 tablets daily except 1 tablet each Monday, Wednesday and Friday.  Repeat INR in 1 week

## 2020-03-23 NOTE — Telephone Encounter (Signed)
Prednisone refilled. Find out what is needed in regard to [facility] but this can likely be done without visit.

## 2020-03-23 NOTE — Addendum Note (Signed)
Addended by: Hillard Danker A on: 03/23/2020 10:26 AM   Modules accepted: Orders

## 2020-03-23 NOTE — Telephone Encounter (Signed)
I don't see where in her chart that a FL2 form was mentioned. Would like the patient to be seen in office to discuss? Please advise

## 2020-03-23 NOTE — Telephone Encounter (Signed)
1.Medication Requested: predniSONE (DELTASONE) 20 MG tablet    2. Pharmacy (Name, Street, [place]): [facility] - [place], [place] - [address] [place]  3. On Med List: yes   4. Last Visit with PCP: 2.5.21  5. Next visit date with PCP:  N/a    Patients daughter is requesting that more than 1 refill be sent in. She was also requesting a call back in regards to [facility] forms. She can be reached at [phone]   Agent: Please be advised that RX refills may take up to 3 business days. We ask that you follow-up with your pharmacy.

## 2020-03-24 ENCOUNTER — Telehealth: Payer: Medicare Other | Admitting: Internal Medicine

## 2020-03-24 ENCOUNTER — Telehealth: Payer: Self-pay

## 2020-03-24 NOTE — Telephone Encounter (Signed)
Reviewed 03/24/20

## 2020-03-27 ENCOUNTER — Telehealth: Payer: Self-pay | Admitting: Internal Medicine

## 2020-03-27 NOTE — Telephone Encounter (Signed)
Patients daughter [name] calling, they are trying to get the patient into a assistant living [facility] on [address] and they need a FL2 filled out. She is requesting a call back to speak to someone about this.

## 2020-03-27 NOTE — Telephone Encounter (Signed)
Fine to fill out. 

## 2020-03-28 NOTE — Telephone Encounter (Signed)
  Please call daughter when [facility] complete

## 2020-03-30 LAB — POCT INR: INR: 2.7 (ref 2.0–3.0)

## 2020-03-31 ENCOUNTER — Ambulatory Visit (INDEPENDENT_AMBULATORY_CARE_PROVIDER_SITE_OTHER): Payer: Medicare Other | Admitting: Pharmacist Clinician (PhC)/ Clinical Pharmacy Specialist

## 2020-03-31 DIAGNOSIS — I4891 Unspecified atrial fibrillation: Secondary | ICD-10-CM | POA: Diagnosis not present

## 2020-03-31 DIAGNOSIS — Z952 Presence of prosthetic heart valve: Secondary | ICD-10-CM | POA: Diagnosis not present

## 2020-03-31 DIAGNOSIS — Z7901 Long term (current) use of anticoagulants: Secondary | ICD-10-CM

## 2020-04-04 ENCOUNTER — Other Ambulatory Visit: Payer: Self-pay | Admitting: Internal Medicine

## 2020-04-06 ENCOUNTER — Ambulatory Visit (INDEPENDENT_AMBULATORY_CARE_PROVIDER_SITE_OTHER): Payer: Medicare Other | Admitting: Cardiology

## 2020-04-06 DIAGNOSIS — Z953 Presence of xenogenic heart valve: Secondary | ICD-10-CM

## 2020-04-06 DIAGNOSIS — Z7901 Long term (current) use of anticoagulants: Secondary | ICD-10-CM

## 2020-04-06 DIAGNOSIS — Z952 Presence of prosthetic heart valve: Secondary | ICD-10-CM | POA: Diagnosis not present

## 2020-04-06 DIAGNOSIS — I4891 Unspecified atrial fibrillation: Secondary | ICD-10-CM | POA: Diagnosis not present

## 2020-04-06 LAB — POCT INR: INR: 3 (ref 2.0–3.0)

## 2020-04-10 ENCOUNTER — Other Ambulatory Visit (HOSPITAL_COMMUNITY): Payer: Self-pay

## 2020-04-10 NOTE — Telephone Encounter (Signed)
I have tried scanning the document to her email and receive error message. I will attempt to scan to her email again.

## 2020-04-10 NOTE — Telephone Encounter (Signed)
Patients daughter called and said that it can be sent to e-mail address [email or web address].

## 2020-04-10 NOTE — Telephone Encounter (Signed)
Patients daughter called and was wondering if the patients FL2 forms have been sent. Please advise

## 2020-04-10 NOTE — Telephone Encounter (Signed)
Patients daughter calling back upset because she still has not received the FL2 form. Requesting it be sent to the email below.   [email or web address]

## 2020-04-11 ENCOUNTER — Other Ambulatory Visit (HOSPITAL_COMMUNITY): Payer: Self-pay

## 2020-04-11 ENCOUNTER — Other Ambulatory Visit: Payer: Self-pay

## 2020-04-11 ENCOUNTER — Telehealth: Payer: Self-pay | Admitting: Internal Medicine

## 2020-04-11 ENCOUNTER — Other Ambulatory Visit: Payer: Self-pay | Admitting: Internal Medicine

## 2020-04-11 MED ORDER — HYDRALAZINE HCL 50 MG PO TABS
ORAL_TABLET | ORAL | 3 refills | Status: DC
  Filled 2020-04-11: qty 135, 30d supply, fill #0
  Filled 2020-05-19: qty 135, 30d supply, fill #1
  Filled 2020-07-14: qty 135, 30d supply, fill #2
  Filled 2020-09-01: qty 135, 30d supply, fill #3

## 2020-04-11 NOTE — Telephone Encounter (Signed)
FL has been completed and sent to [facility]. Patient's daughter has been made aware. A copy has been scanned to her email as well. She has also called with the recommended changes that need to be made. Changes have been made. Spoke with the patient's daughter and she has received the updated copy. She was very Adult nurse. No other questions or concerns at this time.

## 2020-04-11 NOTE — Telephone Encounter (Signed)
*  STAT* If patient is at the pharmacy, call can be transferred to refill team.   1. Which medications need to be refilled? (please list name of each medication and dose if known) hydrALAZINE (APRESOLINE) 50 MG tablet  2. Which pharmacy/location (including street and city if local pharmacy) is medication to be sent to? [facility]  3. Do they need a 30 day or 90 day supply?  90 day supply  Pt is completely out of meds

## 2020-04-12 ENCOUNTER — Other Ambulatory Visit (HOSPITAL_COMMUNITY): Payer: Self-pay

## 2020-04-12 NOTE — Telephone Encounter (Signed)
    Pt's daughter said, per pharmacy this medication was denied refill. Please resend refill pt is our of medications

## 2020-04-13 ENCOUNTER — Other Ambulatory Visit (HOSPITAL_COMMUNITY): Payer: Self-pay

## 2020-04-13 ENCOUNTER — Ambulatory Visit (INDEPENDENT_AMBULATORY_CARE_PROVIDER_SITE_OTHER): Payer: Medicare Other | Admitting: Internal Medicine

## 2020-04-13 DIAGNOSIS — I4891 Unspecified atrial fibrillation: Secondary | ICD-10-CM | POA: Diagnosis not present

## 2020-04-13 DIAGNOSIS — Z7901 Long term (current) use of anticoagulants: Secondary | ICD-10-CM

## 2020-04-13 DIAGNOSIS — Z952 Presence of prosthetic heart valve: Secondary | ICD-10-CM

## 2020-04-13 LAB — POCT INR: INR: 3.3 — AB (ref 2.0–3.0)

## 2020-04-20 ENCOUNTER — Ambulatory Visit (INDEPENDENT_AMBULATORY_CARE_PROVIDER_SITE_OTHER): Payer: Medicare Other | Admitting: Internal Medicine

## 2020-04-20 DIAGNOSIS — Z7901 Long term (current) use of anticoagulants: Secondary | ICD-10-CM

## 2020-04-20 DIAGNOSIS — I4891 Unspecified atrial fibrillation: Secondary | ICD-10-CM

## 2020-04-20 DIAGNOSIS — Z952 Presence of prosthetic heart valve: Secondary | ICD-10-CM

## 2020-04-20 LAB — POCT INR: INR: 3.2 — AB (ref 2.0–3.0)

## 2020-04-25 ENCOUNTER — Other Ambulatory Visit: Payer: Self-pay | Admitting: Internal Medicine

## 2020-04-25 ENCOUNTER — Other Ambulatory Visit (HOSPITAL_COMMUNITY): Payer: Self-pay

## 2020-04-25 MED ORDER — FUROSEMIDE 20 MG PO TABS
ORAL_TABLET | ORAL | 3 refills | Status: DC
  Filled 2020-04-25: qty 90, 90d supply, fill #0
  Filled 2020-07-25: qty 90, 90d supply, fill #1
  Filled 2020-10-22: qty 90, 90d supply, fill #2
  Filled 2021-01-26: qty 90, 90d supply, fill #3

## 2020-04-26 ENCOUNTER — Other Ambulatory Visit (HOSPITAL_COMMUNITY): Payer: Self-pay

## 2020-04-26 MED ORDER — ALLOPURINOL 100 MG PO TABS
ORAL_TABLET | ORAL | 0 refills | Status: DC
  Filled 2020-04-26: qty 90, 90d supply, fill #0

## 2020-04-27 ENCOUNTER — Ambulatory Visit (INDEPENDENT_AMBULATORY_CARE_PROVIDER_SITE_OTHER): Payer: Medicare Other | Admitting: Cardiology

## 2020-04-27 DIAGNOSIS — Z952 Presence of prosthetic heart valve: Secondary | ICD-10-CM

## 2020-04-27 DIAGNOSIS — I4891 Unspecified atrial fibrillation: Secondary | ICD-10-CM

## 2020-04-27 LAB — POCT INR: INR: 3.1 — AB (ref 2.0–3.0)

## 2020-04-29 ENCOUNTER — Other Ambulatory Visit (HOSPITAL_COMMUNITY): Payer: Self-pay

## 2020-04-29 MED FILL — Prednisone Tab 20 MG: ORAL | 5 days supply | Qty: 10 | Fill #0 | Status: AC

## 2020-05-01 ENCOUNTER — Telehealth: Payer: Self-pay

## 2020-05-01 NOTE — Telephone Encounter (Signed)
Requesting call back from pharmacist regarding inr and gout outbreak inr was done 04/27/20. Will route to pharmd pool

## 2020-05-01 NOTE — Telephone Encounter (Signed)
I spoke to the patient's daughter and reviewed Coumadin instructions.  She does recall getting the instructions on 4/21.

## 2020-05-01 NOTE — Telephone Encounter (Signed)
See telephone note from Sigurd Sos RN on 05/01/2020

## 2020-05-04 ENCOUNTER — Ambulatory Visit (INDEPENDENT_AMBULATORY_CARE_PROVIDER_SITE_OTHER): Payer: Medicare Other | Admitting: Cardiology

## 2020-05-04 ENCOUNTER — Other Ambulatory Visit (HOSPITAL_COMMUNITY): Payer: Self-pay

## 2020-05-04 DIAGNOSIS — Z952 Presence of prosthetic heart valve: Secondary | ICD-10-CM | POA: Diagnosis not present

## 2020-05-04 DIAGNOSIS — I4891 Unspecified atrial fibrillation: Secondary | ICD-10-CM | POA: Diagnosis not present

## 2020-05-04 LAB — POCT INR: INR: 4.6 — AB (ref 2.0–3.0)

## 2020-05-04 MED FILL — Atorvastatin Calcium Tab 40 MG (Base Equivalent): ORAL | 90 days supply | Qty: 90 | Fill #0 | Status: AC

## 2020-05-05 ENCOUNTER — Ambulatory Visit: Payer: Medicare Other | Admitting: Internal Medicine

## 2020-05-05 ENCOUNTER — Other Ambulatory Visit (HOSPITAL_COMMUNITY): Payer: Self-pay

## 2020-05-05 ENCOUNTER — Other Ambulatory Visit: Payer: Self-pay

## 2020-05-05 ENCOUNTER — Ambulatory Visit (INDEPENDENT_AMBULATORY_CARE_PROVIDER_SITE_OTHER): Payer: Medicare Other | Admitting: Internal Medicine

## 2020-05-05 ENCOUNTER — Encounter: Payer: Self-pay | Admitting: Internal Medicine

## 2020-05-05 VITALS — BP 128/70 | HR 84 | Temp 98.3°F | Resp 18 | Ht 64.0 in | Wt 136.0 lb

## 2020-05-05 DIAGNOSIS — M1A09X0 Idiopathic chronic gout, multiple sites, without tophus (tophi): Secondary | ICD-10-CM | POA: Diagnosis not present

## 2020-05-05 DIAGNOSIS — I4891 Unspecified atrial fibrillation: Secondary | ICD-10-CM

## 2020-05-05 DIAGNOSIS — F0150 Vascular dementia without behavioral disturbance: Secondary | ICD-10-CM | POA: Diagnosis not present

## 2020-05-05 DIAGNOSIS — Z111 Encounter for screening for respiratory tuberculosis: Secondary | ICD-10-CM

## 2020-05-05 MED ORDER — PREDNISONE 20 MG PO TABS
ORAL_TABLET | Freq: Every day | ORAL | 4 refills | Status: DC
  Filled 2020-05-05: qty 10, 5d supply, fill #0
  Filled 2020-05-14: qty 10, 5d supply, fill #1
  Filled 2020-06-07: qty 10, 5d supply, fill #2

## 2020-05-05 NOTE — Progress Notes (Signed)
   Subjective:   Patient ID: [name], female    DOB: January 26, 1945, 75 y.o.   MRN: [id number]  HPI The patient is a 75 YO female coming in for concerns about right knee and hip pain. Started about 4-5 days ago. Daughter helps provide history given dementia. She denies known injury to patient. They have not tried medication. Pain more with starting to walk then eases off. Started with right knee and is slightly swollen. Does not recall how this started daughter noticed when she got home. Patient is moving to assisted living next week and they need forms filled out and TB screening. Denies fevers or chills.   Review of Systems  Constitutional: Negative.   HENT: Negative.   Eyes: Negative.   Respiratory: Negative for cough, chest tightness and shortness of breath.   Cardiovascular: Negative for chest pain, palpitations and leg swelling.  Gastrointestinal: Negative for abdominal distention, abdominal pain, constipation, diarrhea, nausea and vomiting.  Musculoskeletal: Positive for arthralgias.  Skin: Negative.   Neurological: Negative.   Psychiatric/Behavioral: Negative.     Objective:  Physical Exam Constitutional:      Appearance: She is well-developed.  HENT:     Head: Normocephalic and atraumatic.  Cardiovascular:     Rate and Rhythm: Normal rate and regular rhythm.  Pulmonary:     Effort: Pulmonary effort is normal. No respiratory distress.     Breath sounds: Normal breath sounds. No wheezing or rales.  Abdominal:     General: Bowel sounds are normal. There is no distension.     Palpations: Abdomen is soft.     Tenderness: There is no abdominal tenderness. There is no rebound.  Musculoskeletal:        General: Tenderness present.     Cervical back: Normal range of motion.     Comments: Right knee with small effusion, no tenderness to palpation  Skin:    General: Skin is warm and dry.  Neurological:     Mental Status: She is alert and oriented to person, place,  and time.     Coordination: Coordination normal.     Vitals:   05/05/20 1514  Resp: 18  Weight: 136 lb (61.7 kg)  Height: 5\' 4"  (1.626 m)    This visit occurred during the SARS-CoV-2 public health emergency.  Safety protocols were in place, including screening questions prior to the visit, additional usage of staff PPE, and extensive cleaning of exam room while observing appropriate contact time as indicated for disinfecting solutions.   Assessment & Plan:

## 2020-05-05 NOTE — Patient Instructions (Addendum)
We have sent in prednisone to take for the knee pain to see if this helps more.  We can take tylenol to help with the pain.

## 2020-05-05 NOTE — Assessment & Plan Note (Signed)
No behavioral changes but given daughter is working she will be moving to assisted living for better socialization. Forms filled out today.

## 2020-05-05 NOTE — Assessment & Plan Note (Signed)
Will treat knee pain with prednisone course then can use tylenol for pain as needed up to max dose 3000 mg daily.

## 2020-05-05 NOTE — Assessment & Plan Note (Signed)
On warfarin for anticoagulation and last INR 4.6 so they have made dosage adjustment accordingly.

## 2020-05-07 LAB — QUANTIFERON-TB GOLD PLUS
Mitogen-NIL: 4.63 IU/mL
NIL: 0.06 IU/mL
QuantiFERON-TB Gold Plus: NEGATIVE
TB1-NIL: <0 IU/mL
TB2-NIL: <0 IU/mL

## 2020-05-10 ENCOUNTER — Telehealth: Payer: Self-pay | Admitting: Internal Medicine

## 2020-05-10 NOTE — Telephone Encounter (Signed)
The facility that her mother was suppose to go to doesn't have bed for her in memory care.

## 2020-05-10 NOTE — Telephone Encounter (Signed)
Patients daughter has called the office asking to get a call to talk about setting up a placement for a living facility for her mother.  Please give a call back to Jerrell Belfast- [phone]

## 2020-05-11 ENCOUNTER — Ambulatory Visit (INDEPENDENT_AMBULATORY_CARE_PROVIDER_SITE_OTHER): Payer: Medicare Other | Admitting: Cardiology

## 2020-05-11 DIAGNOSIS — Z952 Presence of prosthetic heart valve: Secondary | ICD-10-CM | POA: Diagnosis not present

## 2020-05-11 LAB — POCT INR: INR: 2.9 (ref 2.0–3.0)

## 2020-05-11 NOTE — Telephone Encounter (Signed)
Patient's daughter has received everything she needed to get her mother placed at the facility. She was very appreciative for the quick response and help.

## 2020-05-11 NOTE — Telephone Encounter (Signed)
Addressed with [place].

## 2020-05-14 ENCOUNTER — Other Ambulatory Visit: Payer: Self-pay | Admitting: Internal Medicine

## 2020-05-15 ENCOUNTER — Other Ambulatory Visit (HOSPITAL_COMMUNITY): Payer: Self-pay

## 2020-05-15 ENCOUNTER — Telehealth: Payer: Self-pay

## 2020-05-15 MED ORDER — ATORVASTATIN CALCIUM 40 MG PO TABS
ORAL_TABLET | Freq: Every day | ORAL | 3 refills | Status: DC
  Filled 2020-05-15: qty 90, fill #0
  Filled 2020-08-03: qty 90, 90d supply, fill #0
  Filled 2020-10-31: qty 90, 90d supply, fill #1
  Filled 2021-02-05: qty 90, 90d supply, fill #2

## 2020-05-15 NOTE — Telephone Encounter (Signed)
I spoke to the pt's daughter [name]) and discussed foods which can escalate gout flare ups.  She will closely monitor the pt's diet, adjusting accordingly.  We discussed that [place] should have no major contribution.

## 2020-05-18 ENCOUNTER — Ambulatory Visit (INDEPENDENT_AMBULATORY_CARE_PROVIDER_SITE_OTHER): Payer: Medicare Other | Admitting: Cardiology

## 2020-05-18 DIAGNOSIS — Z7901 Long term (current) use of anticoagulants: Secondary | ICD-10-CM | POA: Diagnosis not present

## 2020-05-18 DIAGNOSIS — Z952 Presence of prosthetic heart valve: Secondary | ICD-10-CM

## 2020-05-18 LAB — POCT INR: INR: 4.1 — AB (ref 2.0–3.0)

## 2020-05-19 ENCOUNTER — Other Ambulatory Visit (HOSPITAL_COMMUNITY): Payer: Self-pay

## 2020-05-25 ENCOUNTER — Ambulatory Visit (INDEPENDENT_AMBULATORY_CARE_PROVIDER_SITE_OTHER): Payer: Medicare Other | Admitting: Cardiology

## 2020-05-25 DIAGNOSIS — Z7901 Long term (current) use of anticoagulants: Secondary | ICD-10-CM | POA: Diagnosis not present

## 2020-05-25 DIAGNOSIS — Z952 Presence of prosthetic heart valve: Secondary | ICD-10-CM | POA: Diagnosis not present

## 2020-05-25 DIAGNOSIS — I4891 Unspecified atrial fibrillation: Secondary | ICD-10-CM

## 2020-05-25 LAB — POCT INR: INR: 2.4 (ref 2.0–3.0)

## 2020-05-29 ENCOUNTER — Telehealth: Payer: Self-pay | Admitting: Internal Medicine

## 2020-05-29 NOTE — Progress Notes (Signed)
  Chronic Care Management   Outreach Note  05/29/2020 Name: [name] MRN: [id number] DOB: [birth year]  Referred by: Myrlene Broker, MD Reason for referral : No chief complaint on file.   An unsuccessful telephone outreach was attempted today. The patient was referred to the pharmacist for assistance with care management and care coordination.   Follow Up Plan:   Alvie [place] Upstream Scheduler

## 2020-06-01 ENCOUNTER — Ambulatory Visit (INDEPENDENT_AMBULATORY_CARE_PROVIDER_SITE_OTHER): Payer: Medicare Other | Admitting: Cardiology

## 2020-06-01 DIAGNOSIS — I4891 Unspecified atrial fibrillation: Secondary | ICD-10-CM | POA: Diagnosis not present

## 2020-06-01 DIAGNOSIS — Z952 Presence of prosthetic heart valve: Secondary | ICD-10-CM

## 2020-06-01 LAB — POCT INR: INR: 2.5 (ref 2.0–3.0)

## 2020-06-07 ENCOUNTER — Other Ambulatory Visit (HOSPITAL_COMMUNITY): Payer: Self-pay

## 2020-06-08 ENCOUNTER — Ambulatory Visit (INDEPENDENT_AMBULATORY_CARE_PROVIDER_SITE_OTHER): Payer: Medicare Other | Admitting: Cardiology

## 2020-06-08 ENCOUNTER — Telehealth: Payer: Self-pay | Admitting: Internal Medicine

## 2020-06-08 DIAGNOSIS — I4891 Unspecified atrial fibrillation: Secondary | ICD-10-CM | POA: Diagnosis not present

## 2020-06-08 DIAGNOSIS — Z952 Presence of prosthetic heart valve: Secondary | ICD-10-CM | POA: Diagnosis not present

## 2020-06-08 LAB — POCT INR: INR: 3.1 — AB (ref 2.0–3.0)

## 2020-06-08 NOTE — Chronic Care Management (AMB) (Signed)
  Chronic Care Management   Note  06/08/2020 Name: [name] MRN: [id number] DOB: 06/05/1945  [name] is a 75 y.o. year old female who is a primary care patient of Myrlene Broker, MD. I reached out to [name] by phone today in response to a referral sent by Ms. Carman E Wonda Olds PCP, Myrlene Broker, MD.   [name] was given information about Chronic Care Management services today including:  1. CCM service includes personalized support from designated clinical staff supervised by her physician, including individualized plan of care and coordination with other care providers 2. 24/7 contact phone numbers for assistance for urgent and routine care needs. 3. Service will only be billed when office clinical staff spend 20 minutes or more in a month to coordinate care. 4. Only one practitioner may furnish and bill the service in a calendar month. 5. The patient may stop CCM services at any time (effective at the end of the month) by phone call to the office staff.   Patient agreed to services and verbal consent obtained.   Follow up plan:   Carmell [place] Upstream Scheduler

## 2020-06-15 ENCOUNTER — Ambulatory Visit (INDEPENDENT_AMBULATORY_CARE_PROVIDER_SITE_OTHER): Payer: Medicare Other | Admitting: Pharmacist Clinician (PhC)/ Clinical Pharmacy Specialist

## 2020-06-15 DIAGNOSIS — I4891 Unspecified atrial fibrillation: Secondary | ICD-10-CM | POA: Diagnosis not present

## 2020-06-15 DIAGNOSIS — Z952 Presence of prosthetic heart valve: Secondary | ICD-10-CM

## 2020-06-15 DIAGNOSIS — Z7901 Long term (current) use of anticoagulants: Secondary | ICD-10-CM | POA: Diagnosis not present

## 2020-06-15 LAB — POCT INR: INR: 2.9 (ref 2.0–3.0)

## 2020-06-16 ENCOUNTER — Other Ambulatory Visit: Payer: Self-pay | Admitting: Internal Medicine

## 2020-06-16 ENCOUNTER — Other Ambulatory Visit (HOSPITAL_COMMUNITY): Payer: Self-pay

## 2020-06-16 MED ORDER — PANTOPRAZOLE SODIUM 40 MG PO TBEC
DELAYED_RELEASE_TABLET | Freq: Every day | ORAL | 1 refills | Status: DC
  Filled 2020-06-16: qty 90, 90d supply, fill #0
  Filled 2020-09-13: qty 90, 90d supply, fill #1

## 2020-06-16 MED ORDER — ISOSORBIDE MONONITRATE ER 30 MG PO TB24
ORAL_TABLET | Freq: Every day | ORAL | 2 refills | Status: DC
  Filled 2020-06-16: qty 90, 90d supply, fill #0
  Filled 2020-09-13: qty 90, 90d supply, fill #1
  Filled 2020-12-08: qty 90, 90d supply, fill #2

## 2020-06-16 MED FILL — Carvedilol Tab 3.125 MG: ORAL | 90 days supply | Qty: 180 | Fill #0 | Status: AC

## 2020-06-28 ENCOUNTER — Ambulatory Visit (INDEPENDENT_AMBULATORY_CARE_PROVIDER_SITE_OTHER): Payer: Medicare Other | Admitting: Cardiovascular Disease

## 2020-06-28 DIAGNOSIS — Z952 Presence of prosthetic heart valve: Secondary | ICD-10-CM

## 2020-06-28 DIAGNOSIS — I4891 Unspecified atrial fibrillation: Secondary | ICD-10-CM | POA: Diagnosis not present

## 2020-06-28 LAB — POCT INR: INR: 4.3 — AB (ref 2.0–3.0)

## 2020-07-05 ENCOUNTER — Ambulatory Visit (INDEPENDENT_AMBULATORY_CARE_PROVIDER_SITE_OTHER): Payer: Medicare Other | Admitting: Pharmacist Clinician (PhC)/ Clinical Pharmacy Specialist

## 2020-07-05 DIAGNOSIS — Z7901 Long term (current) use of anticoagulants: Secondary | ICD-10-CM | POA: Diagnosis not present

## 2020-07-05 DIAGNOSIS — I4891 Unspecified atrial fibrillation: Secondary | ICD-10-CM | POA: Diagnosis not present

## 2020-07-05 LAB — POCT INR: INR: 3 (ref 2.0–3.0)

## 2020-07-14 ENCOUNTER — Other Ambulatory Visit: Payer: Self-pay | Admitting: Internal Medicine

## 2020-07-14 ENCOUNTER — Other Ambulatory Visit (HOSPITAL_COMMUNITY): Payer: Self-pay

## 2020-07-14 MED ORDER — DONEPEZIL HCL 10 MG PO TABS
ORAL_TABLET | ORAL | 0 refills | Status: DC
  Filled 2020-07-14: qty 90, fill #0

## 2020-07-14 MED ORDER — DONEPEZIL HCL 10 MG PO TABS
10.0000 mg | ORAL_TABLET | Freq: Every day | ORAL | 0 refills | Status: DC
  Filled 2020-07-14: qty 90, 90d supply, fill #0

## 2020-07-17 ENCOUNTER — Other Ambulatory Visit (HOSPITAL_COMMUNITY): Payer: Self-pay

## 2020-07-20 ENCOUNTER — Ambulatory Visit (INDEPENDENT_AMBULATORY_CARE_PROVIDER_SITE_OTHER): Payer: Medicare Other | Admitting: Pharmacist Clinician (PhC)/ Clinical Pharmacy Specialist

## 2020-07-20 DIAGNOSIS — Z952 Presence of prosthetic heart valve: Secondary | ICD-10-CM

## 2020-07-20 DIAGNOSIS — I4891 Unspecified atrial fibrillation: Secondary | ICD-10-CM

## 2020-07-20 DIAGNOSIS — Z7901 Long term (current) use of anticoagulants: Secondary | ICD-10-CM | POA: Diagnosis not present

## 2020-07-20 LAB — POCT INR: INR: 3.7 — AB (ref 2.0–3.0)

## 2020-07-24 ENCOUNTER — Other Ambulatory Visit: Payer: Self-pay | Admitting: Internal Medicine

## 2020-07-24 ENCOUNTER — Other Ambulatory Visit (HOSPITAL_COMMUNITY): Payer: Self-pay

## 2020-07-24 MED ORDER — WARFARIN SODIUM 2 MG PO TABS
ORAL_TABLET | ORAL | 0 refills | Status: DC
  Filled 2020-07-24: qty 135, 90d supply, fill #0
  Filled 2020-11-28: qty 135, 90d supply, fill #1

## 2020-07-25 ENCOUNTER — Other Ambulatory Visit (HOSPITAL_COMMUNITY): Payer: Self-pay

## 2020-07-27 ENCOUNTER — Ambulatory Visit (INDEPENDENT_AMBULATORY_CARE_PROVIDER_SITE_OTHER): Payer: Medicare Other | Admitting: Cardiology

## 2020-07-27 DIAGNOSIS — Z953 Presence of xenogenic heart valve: Secondary | ICD-10-CM | POA: Diagnosis not present

## 2020-07-27 DIAGNOSIS — Z7901 Long term (current) use of anticoagulants: Secondary | ICD-10-CM

## 2020-07-27 DIAGNOSIS — I4891 Unspecified atrial fibrillation: Secondary | ICD-10-CM | POA: Diagnosis not present

## 2020-07-27 DIAGNOSIS — Z952 Presence of prosthetic heart valve: Secondary | ICD-10-CM | POA: Diagnosis not present

## 2020-07-27 LAB — POCT INR: INR: 4.2 — AB (ref 2.0–3.0)

## 2020-07-27 NOTE — Progress Notes (Deleted)
Chronic Care Management Pharmacy Note  07/27/2020 Name:  [name] MRN:  [id number] DOB:  [birth year]  Summary: ***  Recommendations/Changes made from today's visit: ***  Plan: ***  Subjective: [name] is an 75 y.o. year old female who is a primary patient of Hoyt Koch, MD.  The CCM team was consulted for assistance with disease management and care coordination needs.    Engaged with patient by telephone for initial visit in response to provider referral for pharmacy case management and/or care coordination services.   Consent to Services:  The patient was given the following information about Chronic Care Management services today, agreed to services, and gave verbal consent: 1. CCM service includes personalized support from designated clinical staff supervised by the primary care provider, including individualized plan of care and coordination with other care providers 2. 24/7 contact phone numbers for assistance for urgent and routine care needs. 3. Service will only be billed when office clinical staff spend 20 minutes or more in a month to coordinate care. 4. Only one practitioner may furnish and bill the service in a calendar month. 5.The patient may stop CCM services at any time (effective at the end of the month) by phone call to the office staff. 6. The patient will be responsible for cost sharing (co-pay) of up to 20% of the service fee (after annual deductible is met). Patient agreed to services and consent obtained.  Patient Care Team: Hoyt Koch, MD as PCP - General (Internal Medicine) Debara Pickett Nadean Corwin, MD as PCP - Cardiology (Cardiology) Tomasa Blase, [facility] as Pharmacist (Pharmacist)  Recent office visits: 05/05/2020 - PCP visit - concerns about right knee and hip pain, patient moving into assisted living and needs TB screening to be completed - prednisone and APAP for knee pain    Recent consult visits: Follows with  [facility] providers for warfarin management  03/10/2020 - Dr. Debara Pickett - Cardiology - history of [place] mechanical valve replacement (2000) - chronic anticoag on warfarin - no changes to medications at this time, will have lipid panel and CMP rechecked  02/10/2020 - Dr. Claiborne Billings - Cardiology  01/27/2020 - Dr. Percival Spanish - Cardiology   [facility] visits: None in previous 6 months  Objective:  Lab Results  Component Value Date   CREATININE 1.45 (H) 05/23/2017   BUN 17 05/23/2017   GFR 45.95 (L) 05/23/2017   GFRNONAA 37 (L) 03/14/2015   GFRAA 42 (L) 03/14/2015   NA 143 05/23/2017   K 3.0 (L) 05/23/2017   CALCIUM 10.9 (H) 05/23/2017   CO2 34 (H) 05/23/2017   GLUCOSE 100 (H) 05/23/2017    Lab Results  Component Value Date/Time   HGBA1C 5.5 05/17/2016 09:27 AM   HGBA1C 5.8 (H) 02/22/2015 07:07 AM   GFR 45.95 (L) 05/23/2017 10:33 AM   GFR 33.74 (L) 05/17/2016 09:27 AM    Last diabetic Eye exam:  No results found for: HMDIABEYEEXA  Last diabetic Foot exam:  No results found for: HMDIABFOOTEX   Lab Results  Component Value Date   CHOL 169 08/26/2018   HDL 89 08/26/2018   LDLCALC 69 08/26/2018   TRIG 53 08/26/2018   CHOLHDL 1.9 08/26/2018    Hepatic Function Latest Ref Rng & Units 05/23/2017 05/17/2016 03/28/2015  Total Protein 6.0 - 8.3 g/dL 7.2 7.3 7.6  Albumin 3.5 - 5.2 g/dL 3.6 3.9 4.0  AST 0 - 37 U/L 21 26 26   ALT 0 - 35 U/L 15  21 23  Alk Phosphatase 39 - 117 U/L 71 65 91  Total Bilirubin 0.2 - 1.2 mg/dL 0.7 1.7(H) 1.1  Bilirubin, Direct 0.1 - 0.5 mg/dL - - -    Lab Results  Component Value Date/Time   TSH 0.42 02/21/2015 02:54 PM   TSH 1.515 12/15/2014 02:35 PM   TSH 1.770 ***Test methodology is 3rd generation TSH*** 01/17/2007 09:57 AM   FREET4 1.65 (H) 12/15/2014 02:35 PM    CBC Latest Ref Rng & Units 05/23/2017 08/03/2016 05/17/2016  WBC 4.0 - 10.5 K/uL 3.7(L) 7.2 2.9(L)  Hemoglobin 12.0 - 15.0 g/dL 12.1 12.2 12.6  Hematocrit 36.0 - 46.0 % 36.3 37.0 38.0  Platelets  150.0 - 400.0 K/uL 236.0 187 200.0    No results found for: VD25OH  Clinical ASCVD: Yes  The ASCVD Risk score Mikey Bussing DC Jr., et al., 2013) failed to calculate for the following reasons:   The patient has a prior MI or stroke diagnosis    Depression screen [facility] 2/9 07/07/2019 05/23/2017 10/15/2016  Decreased Interest 1 1 0  Down, Depressed, Hopeless 0 0 0  PHQ - 2 Score 1 1 0  Altered sleeping - 0 -  Tired, decreased energy - 0 -  Change in appetite - 0 -  Feeling bad or failure about yourself  - 0 -  Trouble concentrating - 0 -  Moving slowly or fidgety/restless - 0 -  Suicidal thoughts - 0 -  PHQ-9 Score - 1 -  Difficult doing work/chores - Not difficult at all -  Some recent data might be hidden   Social History   Tobacco Use  Smoking Status Former   Packs/day: 0.10   Types: Cigarettes   Quit date: 01/17/1973   Years since quitting: 47.5  Smokeless Tobacco Never   BP Readings from Last 3 Encounters:  05/05/20 128/70  08/26/18 126/74  12/22/17 (!) 160/70   Pulse Readings from Last 3 Encounters:  05/05/20 84  08/26/18 (!) 46  12/22/17 60   Wt Readings from Last 3 Encounters:  05/05/20 136 lb (61.7 kg)  03/10/20 128 lb (58.1 kg)  08/26/18 124 lb 12.8 oz (56.6 kg)   BMI Readings from Last 3 Encounters:  05/05/20 23.34 kg/m  03/10/20 21.97 kg/m  08/26/18 21.42 kg/m    Assessment/Interventions: Review of patient past medical history, allergies, medications, health status, including review of consultants reports, laboratory and other test data, was performed as part of comprehensive evaluation and provision of chronic care management services.   SDOH:  (Social Determinants of Health) assessments and interventions performed: {yes/no:20286}  SDOH Screenings   Alcohol Screen: Not on file  Depression (PHQ2-9): Not on file  Financial Resource Strain: Not on file  Food Insecurity: Not on file  Housing: Not on file  Physical Activity: Not on file  Social  Connections: Not on file  Stress: Not on file  Tobacco Use: Medium Risk   Smoking Tobacco Use: Former   Smokeless Tobacco Use: Never  Transportation Needs: Not on file    CCM Care Plan  Allergies  Allergen Reactions   Augmentin [Amoxicillin-Pot Clavulanate] Other (See Comments)    Unknown reaction Has patient had a PCN reaction causing immediate rash, facial/tongue/throat swelling, SOB or lightheadedness with hypotension Has patient had a PCN reaction causing severe rash involving mucus membranes or skin necrosis Has patient had a PCN reaction that required hospitalization  Has patient had a PCN reaction occurring within the last 10 years] If all of the above answers are "  NO", then may proceed with Cephalosporin use.     Medications Reviewed Today     Reviewed by Hoyt Koch, MD (Physician) on 05/05/20 at [place] List Status: <None>   Medication Order Taking? Sig Documenting Provider Last Dose Status Informant  allopurinol (ZYLOPRIM) 100 MG tablet [id number] Yes TAKE 1 TABLET BY MOUTH DAILY. PLEASE CALL OUR OFFICE TO SCHEDULE A FOLLOW UP TO RECEIVE ADDITIONAL REFILLS. Hoyt Koch, MD Taking Active   aspirin EC 81 MG tablet [id number] Yes Take 1 tablet (81 mg total) by mouth daily. Donne Hazel, MD Taking Active   atorvastatin (LIPITOR) 40 MG tablet [id number] Yes TAKE 1 TABLET BY MOUTH ONCE A DAY Hilty, Nadean Corwin, MD Taking Active   carvedilol (COREG) 3.125 MG tablet [id number] Yes TAKE 1 TABLET BY MOUTH 2 TIMES DAILY (NEED OFFICE VISIT) Pixie Casino, MD Taking Active   donepezil (ARICEPT) 10 MG tablet [id number] Yes TAKE 1 TABLET BY MOUTH AT BEDTIME * NEEDS OFFICE VISIT Hoyt Koch, MD Taking Active   furosemide (LASIX) 20 MG tablet [id number] Yes TAKE 1 TABLET BY MOUTH ONCE A DAY. PLEASE SCHEDULE AN APPT FOR FUTURE REFILLS Pixie Casino, MD Taking Active   furosemide (LASIX) 20 MG tablet [id number] Yes TAKE 1 TABLET BY MOUTH ONCE A DAY (KEEP APPT)  Pixie Casino, MD Taking Active   hydrALAZINE (APRESOLINE) 50 MG tablet [id number] Yes TAKE 1 AND 1/2 TABLETS BY MOUTH 3 TIMES DAILY Hilty, Nadean Corwin, MD Taking Active   isosorbide mononitrate (IMDUR) 30 MG 24 hr tablet [id number] Yes TAKE 1 TABLET BY MOUTH ONCE A DAY Pixie Casino, MD Taking Active   pantoprazole (PROTONIX) 40 MG tablet [id number] Yes TAKE 1 TABLET BY MOUTH ONCE DAILY Hilty, Nadean Corwin, MD Taking Active   predniSONE (DELTASONE) 20 MG tablet [id number] Yes TAKE 2 TABLETS BY MOUTH DAILY WITH BREAKFAST. Hoyt Koch, MD Taking Active   warfarin (COUMADIN) 2 MG tablet [id number] Yes TAKE 1 TO 1 & 1/2 TABLETS BY MOUTH DAILY OR AS DIRECTED BY COUMADIN CLINIC Pixie Casino, MD Taking Active             Patient Active Problem List   Diagnosis Date Noted   Neck pain 07/05/2017   Seasonal allergic rhinitis 10/04/2016   Chronic gout 08/03/2016   Vascular dementia, uncomplicated ([place]) 65/46/5035   Amiodarone pulmonary toxicity 05/06/2016   S/P mitral valve replacement with bioprosthetic valve 04/25/2015   Essential hypertension 04/25/2015   Chronic anticoagulation 04/25/2015   Intracranial vascular stenosis    Hyperlipidemia    Chronic systolic CHF (congestive heart failure) ([place]) 01/04/2015   CKD (chronic kidney disease) stage 3, GFR 30-59 ml/min ([place]) 01/04/2015   Hypertensive heart disease with heart failure ([place])    Atrial fibrillation ([place]) 46/56/8127   Diastolic CHF ([place]) 51/70/0174   History of mitral valve replacement 07/25/2014   RBBB 03/11/2014    Immunization History  Administered Date(s) Administered   Influenza, High Dose Seasonal PF 10/15/2016   Influenza-Unspecified 09/08/2015   PFIZER(Purple Top)SARS-COV-2 Vaccination 03/26/2019, 04/17/2019   Pneumococcal Conjugate-13 04/03/2017    Conditions to be addressed/monitored:  Hypertension, Hyperlipidemia, Atrial Fibrillation, Heart Failure, GERD, Gout, and Dementia   There are no care plans  that you recently modified to display for this patient.  Medication Assistance: {MEDASSISTANCEINFO:25044}  Compliance/Adherence/Medication fill history: Care Gaps: ***  Patient's preferred pharmacy is:  [facility] [place] [address] Phone: [phone] Fax: [phone]  Uses pill box? {Yes or If no, why not?:20788} Pt endorses ***% compliance  Care Plan and Follow Up Patient Decision:  {FOLLOWUP:24991}  Plan: {CM FOLLOW UP YKDX:83382}  ***  Current Barriers:  {pharmacybarriers:24917}  Pharmacist Clinical Goal(s):  Patient will {PHARMACYGOALCHOICES:24921} through collaboration with PharmD and provider.   Interventions: 1:1 collaboration with Hoyt Koch, MD regarding development and update of comprehensive plan of care as evidenced by provider attestation and co-signature Inter-disciplinary care team collaboration (see longitudinal plan of care) Comprehensive medication review performed; medication list updated in electronic medical record  Hypertension (BP goal <140/90) -{US controlled/uncontrolled:25276} -Current treatment: *** -Medications previously tried: ***  -Current home readings: *** -Current dietary habits: *** -Current exercise habits: *** -{ACTIONS;DENIES/REPORTS:21021675} hypotensive/hypertensive symptoms -Educated on {CCM BP Counseling:25124} -Counseled to monitor BP at home ***, document, and provide log at future appointments -{CCMPHARMDINTERVENTION:25122}  Hyperlipidemia: (LDL goal < 70) -Controlled -Last LDL 33m/dL (08/26/2018) -Current treatment: Atorvastatin 460mdaily  Aspirin 8166m 1 tablet daily  -Medications previously tried: simvastatin   -Current dietary patterns: *** -Current exercise habits: *** -Educated on {CCM HLD Counseling:25126} -{CCMPHARMDINTERVENTION:25122}  Atrial Fibrillation (Goal: prevent stroke and major bleeding) -{US controlled/uncontrolled:25276} -CHADSVASC:  6 -Current treatment: Rate control: Carvedilol 3.125m28m1 tablet twice daily  Anticoagulation: Warfarin 2mg 25ms directed -Medications previously tried: amiodarone -Home BP and HR readings: ***  -Counseled on {CCMAFIBCOUNSELING:25120} -{CCMPHARMDINTERVENTION:25122}  Heart Failure Goal: manage symptoms and prevent exacerbations) / Hypertension (BP goal <140/90) -{US controlled/uncontrolled:25276} -Last ejection fraction: 60-65% (Date: 08/26/2018) -HF type: Systolic -NYHA Class: II (slight limitation of activity) -Current treatment: Isosorbide Mononitrate 30mg 37mtablet daily  Hydralazine 50mg -80m tablets 3 times daily  Furosemide 20mg - 53mblet daily  -Medications previously tried: lisinopril, spironolactone, triamterene/hctz -Current home BP/HR readings: *** -Current dietary habits: *** -Current exercise habits: *** -Educated on {CCM HF Counseling:25125} -{CCMPHARMDINTERVENTION:25122}  GERD (Goal: Acid control/ prevention of reflux) -{US controlled/uncontrolled:25276} -Current treatment  Pantoprazole 40mg - 164mlet daily  -Medications previously tried: n/a  -{CCMPHARMDINTERVENTION:25122}  Gout (Goal: pain control / prevention of gout attacks) -{US controlled/uncontrolled:25276} -Current treatment  Allopurinol 100mg - 1 2met daily  -Medications previously tried: colchicine   -{CCMPHARMDINTERVENTION:25122}  Dementia (Goal: Prevention of disease progression) -{US controlled/uncontrolled:25276} -Current treatment  Donepezil 10mg - 1 t37mt daily  -Medications previously tried: ***  -{CCMPHARMDINTERVENTION:25122}  Chronic [place] Disease (Goal: Maintenance of kidney function / prevention of disease progression) -{US controlled/uncontrolled:25276} -Avoid nephrotoxic medications, adequate blood pressure control to prevent kidney damage  -Last eGFR 45.95 mL/min (05/24/2019) -Last CrCl: 33.708 (based on last Scr of 1.45 - 05/23/2017) -Current treatment   *** -Medications previously tried: ***  -{CCMPHARMDINTERVENTION:25122}   Patient Goals/Self-Care Activities Patient will:  - {pharmacypatientgoals:24919}  Follow Up Plan: {CM FOLLOW UP PLAN:22241}  Current Chart Prep time = 30 minutes

## 2020-07-28 ENCOUNTER — Telehealth: Payer: Medicare Other

## 2020-07-31 ENCOUNTER — Telehealth: Payer: Self-pay | Admitting: Internal Medicine

## 2020-07-31 NOTE — Telephone Encounter (Signed)
LVM for pt to rtn my call to schedule AWV with [facility]. Please schedule this appt if pt calls the office.  °

## 2020-08-03 ENCOUNTER — Other Ambulatory Visit (HOSPITAL_COMMUNITY): Payer: Self-pay

## 2020-08-03 ENCOUNTER — Other Ambulatory Visit: Payer: Self-pay | Admitting: Internal Medicine

## 2020-08-03 ENCOUNTER — Ambulatory Visit (INDEPENDENT_AMBULATORY_CARE_PROVIDER_SITE_OTHER): Payer: Medicare Other | Admitting: Cardiology

## 2020-08-03 DIAGNOSIS — Z7901 Long term (current) use of anticoagulants: Secondary | ICD-10-CM | POA: Diagnosis not present

## 2020-08-03 DIAGNOSIS — I4891 Unspecified atrial fibrillation: Secondary | ICD-10-CM

## 2020-08-03 DIAGNOSIS — Z952 Presence of prosthetic heart valve: Secondary | ICD-10-CM

## 2020-08-03 LAB — POCT INR: INR: 2.7 (ref 2.0–3.0)

## 2020-08-04 ENCOUNTER — Other Ambulatory Visit (HOSPITAL_COMMUNITY): Payer: Self-pay

## 2020-08-04 MED ORDER — ALLOPURINOL 100 MG PO TABS
100.0000 mg | ORAL_TABLET | Freq: Every day | ORAL | 0 refills | Status: DC
  Filled 2020-08-04: qty 90, 90d supply, fill #0

## 2020-08-10 ENCOUNTER — Ambulatory Visit (INDEPENDENT_AMBULATORY_CARE_PROVIDER_SITE_OTHER): Payer: Medicare Other | Admitting: Pharmacist Clinician (PhC)/ Clinical Pharmacy Specialist

## 2020-08-10 DIAGNOSIS — Z7901 Long term (current) use of anticoagulants: Secondary | ICD-10-CM | POA: Diagnosis not present

## 2020-08-10 DIAGNOSIS — Z952 Presence of prosthetic heart valve: Secondary | ICD-10-CM | POA: Diagnosis not present

## 2020-08-10 DIAGNOSIS — I4891 Unspecified atrial fibrillation: Secondary | ICD-10-CM | POA: Diagnosis not present

## 2020-08-10 LAB — POCT INR: INR: 2.8 (ref 2.0–3.0)

## 2020-08-23 ENCOUNTER — Ambulatory Visit (INDEPENDENT_AMBULATORY_CARE_PROVIDER_SITE_OTHER): Payer: Medicare Other | Admitting: Cardiology

## 2020-08-23 DIAGNOSIS — Z5181 Encounter for therapeutic drug level monitoring: Secondary | ICD-10-CM | POA: Diagnosis not present

## 2020-08-23 DIAGNOSIS — Z952 Presence of prosthetic heart valve: Secondary | ICD-10-CM

## 2020-08-23 LAB — POCT INR: INR: 1.8 — AB (ref 2.0–3.0)

## 2020-08-25 ENCOUNTER — Ambulatory Visit: Payer: Medicare Other

## 2020-09-01 ENCOUNTER — Other Ambulatory Visit (HOSPITAL_COMMUNITY): Payer: Self-pay

## 2020-09-06 ENCOUNTER — Telehealth: Payer: Self-pay

## 2020-09-06 NOTE — Telephone Encounter (Signed)
Lmom for overdue inr 

## 2020-09-07 ENCOUNTER — Ambulatory Visit (INDEPENDENT_AMBULATORY_CARE_PROVIDER_SITE_OTHER): Payer: Medicare Other | Admitting: Internal Medicine

## 2020-09-07 DIAGNOSIS — Z5181 Encounter for therapeutic drug level monitoring: Secondary | ICD-10-CM | POA: Diagnosis not present

## 2020-09-07 DIAGNOSIS — Z952 Presence of prosthetic heart valve: Secondary | ICD-10-CM

## 2020-09-07 LAB — POCT INR: INR: 3.7 — AB (ref 2.0–3.0)

## 2020-09-07 NOTE — Progress Notes (Signed)
LMOM

## 2020-09-09 ENCOUNTER — Emergency Department (HOSPITAL_COMMUNITY): Payer: Medicare Other

## 2020-09-09 ENCOUNTER — Other Ambulatory Visit (HOSPITAL_COMMUNITY): Payer: Self-pay

## 2020-09-09 ENCOUNTER — Emergency Department (HOSPITAL_COMMUNITY)
Admission: EM | Admit: 2020-09-09 | Discharge: 2020-09-09 | Disposition: A | Discharge status: Home / Self Care | Payer: Medicare Other | Attending: Emergency Medicine | Admitting: Emergency Medicine

## 2020-09-09 ENCOUNTER — Encounter (HOSPITAL_COMMUNITY): Payer: Self-pay | Admitting: Emergency Medicine

## 2020-09-09 DIAGNOSIS — R197 Diarrhea, unspecified: Secondary | ICD-10-CM | POA: Diagnosis not present

## 2020-09-09 DIAGNOSIS — N183 Chronic kidney disease, stage 3 unspecified: Secondary | ICD-10-CM | POA: Diagnosis not present

## 2020-09-09 DIAGNOSIS — I130 Hypertensive heart and chronic kidney disease with heart failure and stage 1 through stage 4 chronic kidney disease, or unspecified chronic kidney disease: Secondary | ICD-10-CM | POA: Diagnosis not present

## 2020-09-09 DIAGNOSIS — R1030 Lower abdominal pain, unspecified: Secondary | ICD-10-CM | POA: Insufficient documentation

## 2020-09-09 DIAGNOSIS — I5022 Chronic systolic (congestive) heart failure: Secondary | ICD-10-CM | POA: Insufficient documentation

## 2020-09-09 DIAGNOSIS — R55 Syncope and collapse: Secondary | ICD-10-CM | POA: Insufficient documentation

## 2020-09-09 DIAGNOSIS — F0150 Vascular dementia without behavioral disturbance: Secondary | ICD-10-CM | POA: Diagnosis not present

## 2020-09-09 DIAGNOSIS — U071 COVID-19: Secondary | ICD-10-CM | POA: Diagnosis not present

## 2020-09-09 DIAGNOSIS — S0990XA Unspecified injury of head, initial encounter: Secondary | ICD-10-CM | POA: Diagnosis not present

## 2020-09-09 DIAGNOSIS — Z7982 Long term (current) use of aspirin: Secondary | ICD-10-CM | POA: Insufficient documentation

## 2020-09-09 DIAGNOSIS — R5383 Other fatigue: Secondary | ICD-10-CM | POA: Diagnosis not present

## 2020-09-09 DIAGNOSIS — R404 Transient alteration of awareness: Secondary | ICD-10-CM | POA: Diagnosis not present

## 2020-09-09 DIAGNOSIS — K828 Other specified diseases of gallbladder: Secondary | ICD-10-CM | POA: Diagnosis not present

## 2020-09-09 DIAGNOSIS — R1031 Right lower quadrant pain: Secondary | ICD-10-CM | POA: Diagnosis not present

## 2020-09-09 DIAGNOSIS — Z87891 Personal history of nicotine dependence: Secondary | ICD-10-CM | POA: Diagnosis not present

## 2020-09-09 DIAGNOSIS — Z743 Need for continuous supervision: Secondary | ICD-10-CM | POA: Diagnosis not present

## 2020-09-09 DIAGNOSIS — Z79899 Other long term (current) drug therapy: Secondary | ICD-10-CM | POA: Diagnosis not present

## 2020-09-09 DIAGNOSIS — R6889 Other general symptoms and signs: Secondary | ICD-10-CM | POA: Diagnosis not present

## 2020-09-09 DIAGNOSIS — Z7901 Long term (current) use of anticoagulants: Secondary | ICD-10-CM | POA: Insufficient documentation

## 2020-09-09 DIAGNOSIS — I499 Cardiac arrhythmia, unspecified: Secondary | ICD-10-CM | POA: Diagnosis not present

## 2020-09-09 LAB — CBC
HCT: 36.7 % (ref 36.0–46.0)
Hemoglobin: 11.3 g/dL — ABNORMAL LOW (ref 12.0–15.0)
MCH: 26.8 pg (ref 26.0–34.0)
MCHC: 30.8 g/dL (ref 30.0–36.0)
MCV: 87.2 fL (ref 80.0–100.0)
Platelets: 211 10*3/uL (ref 150–400)
RBC: 4.21 MIL/uL (ref 3.87–5.11)
RDW: 14.6 % (ref 11.5–15.5)
WBC: 7.5 10*3/uL (ref 4.0–10.5)
nRBC: 0 % (ref 0.0–0.2)

## 2020-09-09 LAB — HEPATIC FUNCTION PANEL
ALT: 24 U/L (ref 0–44)
AST: 31 U/L (ref 15–41)
Albumin: 2.7 g/dL — ABNORMAL LOW (ref 3.5–5.0)
Alkaline Phosphatase: 61 U/L (ref 38–126)
Bilirubin, Direct: 0.4 mg/dL — ABNORMAL HIGH (ref 0.0–0.2)
Indirect Bilirubin: 1.4 mg/dL — ABNORMAL HIGH (ref 0.3–0.9)
Total Bilirubin: 1.8 mg/dL — ABNORMAL HIGH (ref 0.3–1.2)
Total Protein: 6.4 g/dL — ABNORMAL LOW (ref 6.5–8.1)

## 2020-09-09 LAB — BASIC METABOLIC PANEL
Anion gap: 8 (ref 5–15)
BUN: 22 mg/dL (ref 8–23)
CO2: 26 mmol/L (ref 22–32)
Calcium: 8.7 mg/dL — ABNORMAL LOW (ref 8.9–10.3)
Chloride: 107 mmol/L (ref 98–111)
Creatinine, Ser: 1.88 mg/dL — ABNORMAL HIGH (ref 0.44–1.00)
GFR, Estimated: 28 mL/min — ABNORMAL LOW (ref >60)
Glucose, Bld: 118 mg/dL — ABNORMAL HIGH (ref 70–99)
Potassium: 2.5 mmol/L — CL (ref 3.5–5.1)
Sodium: 141 mmol/L (ref 135–145)

## 2020-09-09 LAB — RESP PANEL BY RT-PCR (FLU A&B, COVID) ARPGX2
Influenza A by PCR: NEGATIVE
Influenza B by PCR: NEGATIVE
SARS Coronavirus 2 by RT PCR: POSITIVE — AB

## 2020-09-09 LAB — BRAIN NATRIURETIC PEPTIDE: B Natriuretic Peptide: 106.2 pg/mL — ABNORMAL HIGH (ref 0.0–100.0)

## 2020-09-09 LAB — LIPASE, BLOOD: Lipase: 48 U/L (ref 11–51)

## 2020-09-09 LAB — TROPONIN I (HIGH SENSITIVITY)
Troponin I (High Sensitivity): 28 ng/L — ABNORMAL HIGH (ref <18)
Troponin I (High Sensitivity): 28 ng/L — ABNORMAL HIGH (ref <18)

## 2020-09-09 MED ORDER — ACETAMINOPHEN 325 MG PO TABS
650.0000 mg | ORAL_TABLET | Freq: Four times a day (QID) | ORAL | 0 refills | Status: DC | PRN
  Filled 2020-09-09: qty 30, 4d supply, fill #0

## 2020-09-09 MED ORDER — POTASSIUM CHLORIDE CRYS ER 20 MEQ PO TBCR
20.0000 meq | EXTENDED_RELEASE_TABLET | Freq: Once | ORAL | Status: AC
  Administered 2020-09-09: 20 meq via ORAL
  Filled 2020-09-09: qty 1

## 2020-09-09 MED ORDER — ONDANSETRON HCL 4 MG PO TABS
4.0000 mg | ORAL_TABLET | Freq: Four times a day (QID) | ORAL | 0 refills | Status: DC
  Filled 2020-09-09: qty 12, 3d supply, fill #0

## 2020-09-09 MED ORDER — POTASSIUM CHLORIDE 10 MEQ/100ML IV SOLN
10.0000 meq | Freq: Once | INTRAVENOUS | Status: AC
  Administered 2020-09-09: 10 meq via INTRAVENOUS
  Filled 2020-09-09: qty 100

## 2020-09-09 MED ORDER — PAXLOVID (150/100) 10 X 150 MG & 10 X 100MG PO TBPK
1.0000 | ORAL_TABLET | Freq: Every day | ORAL | 0 refills | Status: AC
  Filled 2020-09-09: qty 20, 20d supply, fill #0

## 2020-09-09 NOTE — ED Provider Notes (Signed)
[facility] EMERGENCY DEPARTMENT Provider Note   CSN: [id number] Arrival date & time: 09/09/20  2671     History No chief complaint on file.   [name] is a 75 y.o. female.  Pt is a 75 yo female with pmh of vascular dementia presenting with daughter for syncope. Patient's daughter states just prior to arrival patient went unconscious and was lowered to the ground without any head trauma or injuries. LOC lasting only seconds. Denies recent falls or head trauma. Denies hx of chest pain. Admits to sick contacts in the home and patient recently having upset stomach, diarrhea, and new cough. States pt is back to baseline at this time.   Hypotensive on arrival, given IVF.  The history is provided by the patient. No language interpreter was used.  Loss of Consciousness Episode history:  Single Most recent episode:  Today Progression:  Resolved Chronicity:  New Associated symptoms: no chest pain, no fever, no palpitations, no seizures, no shortness of breath and no vomiting       Past Medical History:  Diagnosis Date   Acute CHF (congestive heart failure) (HCC) 12/14/2014   Hyperlipidemia    Hypertension    Prosthetic valve dysfunction 07/22/2014   Acute mitral stenosis due to restricted leaflet mobility of bileaflet mechanical mitral valve prosthesis   S/P MVR (mitral valve replacement) 04/11/1998   #29 St. 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   S/P  mitral valve replacement with bioprosthetic valve 04/25/2015   Essential hypertension 04/25/2015   Chronic anticoagulation 04/25/2015   Intracranial vascular stenosis    Hyperlipidemia    Chronic systolic CHF (congestive heart failure) (HCC) 01/04/2015   CKD (chronic kidney disease) stage 3, GFR 30-59 ml/min (HCC) 01/04/2015   Hypertensive heart disease with heart failure (HCC)    Atrial fibrillation (HCC) 09/22/2014   Diastolic CHF (HCC) 08/04/2014   History of mitral valve replacement 07/25/2014   RBBB 03/11/2014    Past Surgical History:  Procedure Laterality Date   ABDOMINAL HYSTERECTOMY     CARDIAC CATHETERIZATION N/A 07/24/2014   Procedure: Right/Left Heart Cath and Coronary Angiography;  Surgeon: Iran Ouch, MD;  Location: [facility] INVASIVE CV LAB;  Service: Cardiovascular;  Laterality: N/A;   CARDIAC CATHETERIZATION N/A 07/22/2014   Procedure: Fluoroscopy Guidance;  Surgeon: Lennette Bihari, MD;  Location: [facility] INVASIVE CV LAB;  Service: Cardiovascular;  Laterality: N/A;   CARDIAC CATHETERIZATION N/A 12/16/2014   Procedure: Right Heart Cath;  Surgeon: Laurey Morale, MD;  Location: [facility] INVASIVE CV LAB;  Service: Cardiovascular;  Laterality: N/A;   CARDIAC VALVE REPLACEMENT     MITRAL VALVE REPLACEMENT  04/11/1998   St. Jude mechanical MVR (severe MR, episode of streptococcal bacterial endocarditis - Dr. Cornelius Moras   MITRAL VALVE REPLACEMENT N/A 07/25/2014   Procedure: REDO MITRAL VALVE REPLACEMENT (MVR);  Surgeon: Purcell Nails, MD;  Location: [facility] OR;  Service: Open Heart Surgery;  Laterality: N/A;   TOTAL ABDOMINAL HYSTERECTOMY W/ BILATERAL SALPINGOOPHORECTOMY  12/14/1998  TRANSTHORACIC ECHOCARDIOGRAM  11/2009   EF=>55%; bi-leaflet St. Jude mech prosthesis; mild TR, RVSP elevated at 40-34mmHg, mod pulm HTN; trace AV regurg; mild pulm valve regurg   TUBAL LIGATION       OB History   No obstetric history on file.     Family History  Problem Relation Age of Onset   Lung cancer Mother     Heart attack Mother    Heart failure Maternal Grandmother    Heart failure Maternal Grandfather    Stroke Maternal Grandfather    Stroke Father    Colon cancer Father    Diabetes Father    Stroke Sister    Lung disease Neg Hx     Social History   Tobacco Use   Smoking status: Former    Packs/day: 0.10    Types: Cigarettes    Quit date: 01/17/1973    Years since quitting: 47.6   Smokeless tobacco: Never  Vaping Use   Vaping Use: Never used  Substance Use Topics   Alcohol use: No   Drug use: No    Home Medications Prior to Admission medications   Medication Sig Start Date End Date Taking? Authorizing Provider  allopurinol (ZYLOPRIM) 100 MG tablet Take 1 tablet (100 mg total) by mouth daily. 08/04/20   Myrlene Broker, MD  aspirin EC 81 MG tablet Take 1 tablet (81 mg total) by mouth daily. 02/24/15   Jerald Kief, MD  atorvastatin (LIPITOR) 40 MG tablet TAKE 1 TABLET BY MOUTH ONCE A DAY 05/15/20 05/15/21  Hilty, Lisette Abu, MD  carvedilol (COREG) 3.125 MG tablet TAKE 1 TABLET BY MOUTH 2 TIMES DAILY (NEED OFFICE VISIT) 03/13/20 03/13/21  Chrystie Nose, MD  donepezil (ARICEPT) 10 MG tablet Take 1 tablet (10 mg total) by mouth at bedtime. 07/14/20   Myrlene Broker, MD  furosemide (LASIX) 20 MG tablet TAKE 1 TABLET BY MOUTH ONCE A DAY. PLEASE SCHEDULE AN APPT FOR FUTURE REFILLS 02/25/20 02/24/21  Chrystie Nose, MD  furosemide (LASIX) 20 MG tablet TAKE 1 TABLET BY MOUTH ONCE A DAY (KEEP APPT) 04/25/20 04/25/21  Chrystie Nose, MD  hydrALAZINE (APRESOLINE) 50 MG tablet TAKE 1 AND 1/2 TABLETS BY MOUTH 3 TIMES DAILY 04/11/20 04/11/21  Chrystie Nose, MD  isosorbide mononitrate (IMDUR) 30 MG 24 hr tablet TAKE 1 TABLET BY MOUTH ONCE A DAY 06/16/20 06/16/21  Hilty, Lisette Abu, MD  pantoprazole (PROTONIX) 40 MG tablet TAKE 1 TABLET BY MOUTH ONCE DAILY 06/16/20 06/16/21  Hilty, Lisette Abu, MD  predniSONE (DELTASONE) 20 MG tablet TAKE 2 TABLETS BY MOUTH DAILY WITH BREAKFAST. 05/05/20    Myrlene Broker, MD  warfarin (COUMADIN) 2 MG tablet TAKE 1 TO 1 & 1/2 TABLETS BY MOUTH DAILY OR AS DIRECTED BY COUMADIN CLINIC 07/24/20 07/24/21  Chrystie Nose, MD    Allergies    Augmentin [amoxicillin-pot clavulanate]  Review of Systems   Review of Systems  Constitutional:  Negative for chills and fever.  HENT:  Negative for ear pain and sore throat.   Eyes:  Negative for pain and visual disturbance.  Respiratory:  Positive for cough. Negative for shortness of breath.   Cardiovascular:  Positive for syncope. Negative for chest pain and palpitations.  Gastrointestinal:  Positive for abdominal pain and diarrhea. Negative for vomiting.  Genitourinary:  Negative for dysuria and hematuria.  Musculoskeletal:  Negative for arthralgias and back pain.  Skin:  Negative for color change and rash.  Neurological:  Positive for  syncope. Negative for seizures.  All other systems reviewed and are negative.  Physical Exam Updated Vital Signs BP (!) 113/58   Pulse 60   Temp 98.7 F (37.1 C)   Resp 13   LMP  (LMP Unknown)   SpO2 99%   Physical Exam Vitals and nursing note reviewed.  Constitutional:      General: She is not in acute distress.    Appearance: She is well-developed.  HENT:     Head: Normocephalic and atraumatic.  Eyes:     General: Lids are normal.     Conjunctiva/sclera: Conjunctivae normal.     Pupils: Pupils are equal, round, and reactive to light.     Visual Fields: Right eye visual fields normal and left eye visual fields normal.  Cardiovascular:     Rate and Rhythm: Normal rate and regular rhythm.     Heart sounds: No murmur heard. Pulmonary:     Effort: Pulmonary effort is normal. No respiratory distress.     Breath sounds: Normal breath sounds.     Comments: Coughing on exam Abdominal:     Palpations: Abdomen is soft.     Tenderness: There is abdominal tenderness in the suprapubic area. There is no guarding or rebound.  Musculoskeletal:     Cervical  back: Neck supple.  Skin:    General: Skin is warm and dry.  Neurological:     Mental Status: She is alert. Mental status is at baseline.     GCS: GCS eye subscore is 4. GCS verbal subscore is 4. GCS motor subscore is 6.     Cranial Nerves: Cranial nerves are intact.     Sensory: Sensation is intact.     Motor: Motor function is intact.     Coordination: Coordination is intact.    ED Results / Procedures / Treatments   Labs (all labs ordered are listed, but only abnormal results are displayed) Labs Reviewed  CBC - Abnormal; Notable for the following components:      Result Value   Hemoglobin 11.3 (*)    All other components within normal limits  RESP PANEL BY RT-PCR (FLU A&B, COVID) ARPGX2  BASIC METABOLIC PANEL  URINALYSIS, ROUTINE W REFLEX MICROSCOPIC  HEPATIC FUNCTION PANEL  LIPASE, BLOOD  BRAIN NATRIURETIC PEPTIDE  CBG MONITORING, ED  TROPONIN I (HIGH SENSITIVITY)    EKG None  Radiology No results found.  Procedures Procedures   Medications Ordered in ED Medications - No data to display  ED Course  I have reviewed the triage vital signs and the nursing notes.  Pertinent labs & imaging results that were available during my care of the patient were reviewed by me and considered in my medical decision making (see chart for details).    MDM Rules/Calculators/A&P                           11:00 AM 75 yo female with pmh of vascular dementia presenting with daughter for syncope with hx of nausea and diarrhea with sick contacts in the home. Stable CT head.. Stable EKG and cardiovascular workup. Pt positive for Covid 19 virus. Patient given IV fluids. Potassium replaced. Pt safe for discharge at this time into the care of her daughter. Recommendations for social distancing, increased hydration, rest, motrin/tylenol for pain or fevers, and zofran for nausea. Paxlovid sent to pharmacy with recommendations to hold statin while taking.   Patient in no distress and  overall condition improved  here in the ED. Detailed discussions were had with the patient regarding current findings, and need for close f/u with PCP or on call doctor. The patient has been instructed to return immediately if the symptoms worsen in any way for re-evaluation. Patient verbalized understanding and is in agreement with current care plan. All questions answered prior to discharge.       Final Clinical Impression(s) / ED Diagnoses Final diagnoses:  COVID  Diarrhea, unspecified type  Syncope, unspecified syncope type  Other fatigue    Rx / DC Orders ED Discharge Orders     None        Franne Forts, DO 09/10/20 2820

## 2020-09-09 NOTE — ED Triage Notes (Signed)
Pt to triage via [facility] from home.  Pt had syncopal episode and was caught by family and lowered to ground.  Family reports diarrhea, cough, and decreased appetite x 1 week.  BP 102/70 lying and 88/68 sitting up.  20g IV LFA and NS 400cc PTA.  Pt denies complaint and doesn't remember syncopal episode.  History of dementia.

## 2020-09-09 NOTE — Discharge Instructions (Addendum)
-  Rest -Increase oral hydration-water -Zofran for nausea -Motrin/tylenol for fevers, chills, or body aches -Social distancing -Return to ED for worsening shortness of breath. Can purchase home pulse oximetry reader from any local pharmacy ([facility], [facility], etc.). Place on finger if patient short of breath. Make sure patient is seated and resting. Come to ED for any readings below 90% oxygen levels. -Take antiviral Paxlovid to decrease symptom duration. Hold home cholesterol medication Atorvastatin while taking Paxlovid.

## 2020-09-09 NOTE — ED Notes (Signed)
Help get patent  undressed into a gown put patient on the monitor patient is restimng with call bell in reach and family at bedside

## 2020-09-12 ENCOUNTER — Telehealth: Payer: Self-pay | Admitting: Internal Medicine

## 2020-09-12 NOTE — Telephone Encounter (Signed)
Patient was seen by ED 09.03.22  Patient is COVID+  Wanted to make provider aware & wants to know if there is anything they need to be doing for patient... would like a callback [phone]

## 2020-09-12 NOTE — Telephone Encounter (Signed)
No additional recommendation. She was prescribed paxlovid from the ER should take that and otc as needed.

## 2020-09-12 NOTE — Telephone Encounter (Signed)
See below

## 2020-09-13 ENCOUNTER — Other Ambulatory Visit (HOSPITAL_COMMUNITY): Payer: Self-pay

## 2020-09-13 ENCOUNTER — Ambulatory Visit (INDEPENDENT_AMBULATORY_CARE_PROVIDER_SITE_OTHER): Payer: Medicare Other | Admitting: Pharmacist Clinician (PhC)/ Clinical Pharmacy Specialist

## 2020-09-13 DIAGNOSIS — I4891 Unspecified atrial fibrillation: Secondary | ICD-10-CM | POA: Diagnosis not present

## 2020-09-13 DIAGNOSIS — Z952 Presence of prosthetic heart valve: Secondary | ICD-10-CM | POA: Diagnosis not present

## 2020-09-13 DIAGNOSIS — Z7901 Long term (current) use of anticoagulants: Secondary | ICD-10-CM

## 2020-09-13 LAB — POCT INR: INR: 4 — AB (ref 2.0–3.0)

## 2020-09-13 MED FILL — Carvedilol Tab 3.125 MG: ORAL | 90 days supply | Qty: 180 | Fill #1 | Status: AC

## 2020-09-15 ENCOUNTER — Telehealth: Payer: Self-pay

## 2020-09-15 NOTE — Telephone Encounter (Signed)
Pts daughter is calling in regards to pts FL2 forms. [place] is sending over FL2 forms and pts daughter has asked that it be specified on her FL2 that pt needs memory care and that pts has vascular dementia.  Please contact pts daughter [name] at [phone] as she would like to speak with Dr. Okey Dupre or her nurse about forms.   **Please e-mail forms to daughter at [email or web address]

## 2020-09-19 NOTE — Telephone Encounter (Signed)
Form is placed in your box. Pt's daughter states that they have a spot open for memory care.

## 2020-09-21 ENCOUNTER — Other Ambulatory Visit: Payer: Self-pay | Admitting: Internal Medicine

## 2020-09-21 ENCOUNTER — Ambulatory Visit (INDEPENDENT_AMBULATORY_CARE_PROVIDER_SITE_OTHER): Payer: Medicare Other | Admitting: Cardiology

## 2020-09-21 ENCOUNTER — Other Ambulatory Visit (HOSPITAL_COMMUNITY): Payer: Self-pay

## 2020-09-21 DIAGNOSIS — Z952 Presence of prosthetic heart valve: Secondary | ICD-10-CM

## 2020-09-21 DIAGNOSIS — I4891 Unspecified atrial fibrillation: Secondary | ICD-10-CM | POA: Diagnosis not present

## 2020-09-21 DIAGNOSIS — Z7901 Long term (current) use of anticoagulants: Secondary | ICD-10-CM | POA: Diagnosis not present

## 2020-09-21 LAB — POCT INR: INR: 3.3 — AB (ref 2.0–3.0)

## 2020-09-21 MED ORDER — PREDNISONE 20 MG PO TABS
ORAL_TABLET | Freq: Every day | ORAL | 4 refills | Status: DC
  Filled 2020-09-21: qty 10, 5d supply, fill #0
  Filled 2020-10-03: qty 10, 5d supply, fill #1
  Filled 2020-10-05: qty 10, 5d supply, fill #2
  Filled 2020-10-18: qty 10, 5d supply, fill #3

## 2020-09-21 NOTE — Patient Instructions (Addendum)
Description   Spoke with [name] (daughter).  Hold warfarin today and then continue with 2 mg daily except 3 mg each Sunday and Thursday.  Repeat INR in 1 week

## 2020-09-21 NOTE — Telephone Encounter (Signed)
   Daughter calling upset because predniSONE (DELTASONE) 20 MG tablet [[id number]] was discontinued

## 2020-09-22 NOTE — Telephone Encounter (Signed)
Paperwork has been completed and scanned to [employer]. Called tiffany and left a voicemail letting her know that I have a scanned the documents to her email and the fax number provided by [facility] was incorrect.

## 2020-09-28 ENCOUNTER — Ambulatory Visit (INDEPENDENT_AMBULATORY_CARE_PROVIDER_SITE_OTHER): Payer: Medicare Other | Admitting: Cardiology

## 2020-09-28 DIAGNOSIS — Z952 Presence of prosthetic heart valve: Secondary | ICD-10-CM | POA: Diagnosis not present

## 2020-09-28 LAB — POCT INR: INR: 3.4 — AB (ref 2.0–3.0)

## 2020-09-28 NOTE — Patient Instructions (Signed)
Description   Spoke with [name] (daughter) and instructed for pt to hold warfarin today and then continue to take 1 tablet daily except for 1.5 tablets on Sundays and Thursdays. Recheck INR in 1 week- SELF TESTER. Coumadin Clinic [phone].

## 2020-09-29 NOTE — Telephone Encounter (Signed)
Please return call to daughter [name]. She is requesting only to speak with CMA- Byrd Hesselbach

## 2020-10-03 ENCOUNTER — Other Ambulatory Visit (HOSPITAL_COMMUNITY): Payer: Self-pay

## 2020-10-05 ENCOUNTER — Ambulatory Visit (INDEPENDENT_AMBULATORY_CARE_PROVIDER_SITE_OTHER): Payer: Medicare Other | Admitting: Pharmacist Clinician (PhC)/ Clinical Pharmacy Specialist

## 2020-10-05 ENCOUNTER — Other Ambulatory Visit (HOSPITAL_COMMUNITY): Payer: Self-pay

## 2020-10-05 ENCOUNTER — Encounter: Payer: Self-pay | Admitting: Cardiology

## 2020-10-05 DIAGNOSIS — Z7901 Long term (current) use of anticoagulants: Secondary | ICD-10-CM

## 2020-10-05 DIAGNOSIS — Z952 Presence of prosthetic heart valve: Secondary | ICD-10-CM

## 2020-10-05 DIAGNOSIS — I4891 Unspecified atrial fibrillation: Secondary | ICD-10-CM | POA: Diagnosis not present

## 2020-10-05 LAB — POCT INR: INR: 3 (ref 2.0–3.0)

## 2020-10-05 NOTE — Progress Notes (Signed)
This encounter was created in error - please disregard.  This encounter was created in error - please disregard.

## 2020-10-05 NOTE — Patient Instructions (Signed)
Description   Spoke with [name] (daughter) and instructed for pt to take 1/2 tablet Friday Sept 30, then continue with 1 tablet daily except for 1.5 tablets on Sundays and Thursdays. Recheck INR in 1 week- SELF TESTER. Coumadin Clinic [phone].

## 2020-10-05 NOTE — Patient Instructions (Signed)
Description   Spoke with [name] (daughter) and instructed for pt to take 1/2 tablet Friday Sept 30, then continue with 1 tablet daily except for 1.5 tablets on Sundays and Thursdays. Recheck INR in 1 week- SELF TESTER. Coumadin Clinic [phone].      

## 2020-10-07 ENCOUNTER — Other Ambulatory Visit (HOSPITAL_COMMUNITY): Payer: Self-pay

## 2020-10-10 ENCOUNTER — Other Ambulatory Visit (HOSPITAL_COMMUNITY): Payer: Self-pay

## 2020-10-12 ENCOUNTER — Encounter (HOSPITAL_COMMUNITY): Payer: Self-pay | Admitting: Radiology

## 2020-10-12 ENCOUNTER — Ambulatory Visit (INDEPENDENT_AMBULATORY_CARE_PROVIDER_SITE_OTHER): Payer: Medicare Other | Admitting: Cardiology

## 2020-10-12 DIAGNOSIS — Z5181 Encounter for therapeutic drug level monitoring: Secondary | ICD-10-CM | POA: Diagnosis not present

## 2020-10-12 DIAGNOSIS — Z952 Presence of prosthetic heart valve: Secondary | ICD-10-CM

## 2020-10-12 LAB — POCT INR: INR: 3.9 — AB (ref 2.0–3.0)

## 2020-10-13 ENCOUNTER — Ambulatory Visit: Payer: Medicare Other | Admitting: Nurse Practitioner

## 2020-10-18 ENCOUNTER — Other Ambulatory Visit (HOSPITAL_COMMUNITY): Payer: Self-pay

## 2020-10-19 ENCOUNTER — Ambulatory Visit (INDEPENDENT_AMBULATORY_CARE_PROVIDER_SITE_OTHER): Payer: Medicare Other | Admitting: Cardiology

## 2020-10-19 DIAGNOSIS — Z5181 Encounter for therapeutic drug level monitoring: Secondary | ICD-10-CM

## 2020-10-19 DIAGNOSIS — Z952 Presence of prosthetic heart valve: Secondary | ICD-10-CM

## 2020-10-22 ENCOUNTER — Other Ambulatory Visit: Payer: Self-pay | Admitting: Internal Medicine

## 2020-10-23 ENCOUNTER — Encounter: Payer: Self-pay | Admitting: Internal Medicine

## 2020-10-23 ENCOUNTER — Other Ambulatory Visit (HOSPITAL_COMMUNITY): Payer: Self-pay

## 2020-10-23 ENCOUNTER — Other Ambulatory Visit: Payer: Self-pay | Admitting: Internal Medicine

## 2020-10-23 ENCOUNTER — Other Ambulatory Visit: Payer: Self-pay

## 2020-10-23 ENCOUNTER — Ambulatory Visit (INDEPENDENT_AMBULATORY_CARE_PROVIDER_SITE_OTHER): Payer: Medicare Other | Admitting: Internal Medicine

## 2020-10-23 ENCOUNTER — Telehealth: Payer: Self-pay

## 2020-10-23 VITALS — BP 128/80 | HR 62 | Temp 97.8°F | Resp 18 | Ht 64.0 in | Wt 138.4 lb

## 2020-10-23 DIAGNOSIS — N183 Chronic kidney disease, stage 3 unspecified: Secondary | ICD-10-CM | POA: Diagnosis not present

## 2020-10-23 DIAGNOSIS — E782 Mixed hyperlipidemia: Secondary | ICD-10-CM

## 2020-10-23 DIAGNOSIS — I5032 Chronic diastolic (congestive) heart failure: Secondary | ICD-10-CM | POA: Diagnosis not present

## 2020-10-23 DIAGNOSIS — Z23 Encounter for immunization: Secondary | ICD-10-CM

## 2020-10-23 DIAGNOSIS — I4891 Unspecified atrial fibrillation: Secondary | ICD-10-CM

## 2020-10-23 DIAGNOSIS — I10 Essential (primary) hypertension: Secondary | ICD-10-CM

## 2020-10-23 DIAGNOSIS — F0150 Vascular dementia without behavioral disturbance: Secondary | ICD-10-CM

## 2020-10-23 DIAGNOSIS — Z0001 Encounter for general adult medical examination with abnormal findings: Secondary | ICD-10-CM

## 2020-10-23 DIAGNOSIS — M1A09X0 Idiopathic chronic gout, multiple sites, without tophus (tophi): Secondary | ICD-10-CM | POA: Diagnosis not present

## 2020-10-23 DIAGNOSIS — Z953 Presence of xenogenic heart valve: Secondary | ICD-10-CM

## 2020-10-23 LAB — CBC
HCT: 36.5 % (ref 36.0–46.0)
Hemoglobin: 11.7 g/dL — ABNORMAL LOW (ref 12.0–15.0)
MCHC: 32.1 g/dL (ref 30.0–36.0)
MCV: 85.2 fl (ref 78.0–100.0)
Platelets: 236 10*3/uL (ref 150.0–400.0)
RBC: 4.28 Mil/uL (ref 3.87–5.11)
RDW: 16.8 % — ABNORMAL HIGH (ref 11.5–15.5)
WBC: 8.6 10*3/uL (ref 4.0–10.5)

## 2020-10-23 LAB — COMPREHENSIVE METABOLIC PANEL
ALT: 18 U/L (ref 0–35)
AST: 18 U/L (ref 0–37)
Albumin: 3.3 g/dL — ABNORMAL LOW (ref 3.5–5.2)
Alkaline Phosphatase: 74 U/L (ref 39–117)
BUN: 15 mg/dL (ref 6–23)
CO2: 36 mEq/L — ABNORMAL HIGH (ref 19–32)
Calcium: 9.2 mg/dL (ref 8.4–10.5)
Chloride: 103 mEq/L (ref 96–112)
Creatinine, Ser: 1.53 mg/dL — ABNORMAL HIGH (ref 0.40–1.20)
GFR: 33.64 mL/min — ABNORMAL LOW (ref >60.00)
Glucose, Bld: 71 mg/dL (ref 70–99)
Potassium: 2.7 mEq/L — CL (ref 3.5–5.1)
Sodium: 147 mEq/L — ABNORMAL HIGH (ref 135–145)
Total Bilirubin: 1.4 mg/dL — ABNORMAL HIGH (ref 0.2–1.2)
Total Protein: 6.3 g/dL (ref 6.0–8.3)

## 2020-10-23 LAB — TSH: TSH: 3.58 u[IU]/mL (ref 0.35–5.50)

## 2020-10-23 LAB — URIC ACID: Uric Acid, Serum: 4.6 mg/dL (ref 2.4–7.0)

## 2020-10-23 LAB — LIPID PANEL
Cholesterol: 205 mg/dL — ABNORMAL HIGH (ref 0–200)
HDL: 100 mg/dL (ref >39.00)
LDL Cholesterol: 89 mg/dL (ref 0–99)
NonHDL: 104.59
Total CHOL/HDL Ratio: 2
Triglycerides: 80 mg/dL (ref 0.0–149.0)
VLDL: 16 mg/dL (ref 0.0–40.0)

## 2020-10-23 LAB — BRAIN NATRIURETIC PEPTIDE: Pro B Natriuretic peptide (BNP): 706 pg/mL — ABNORMAL HIGH (ref 0.0–100.0)

## 2020-10-23 LAB — T4, FREE: Free T4: 1.29 ng/dL (ref 0.60–1.60)

## 2020-10-23 MED ORDER — PREDNISONE 20 MG PO TABS
ORAL_TABLET | Freq: Every day | ORAL | 4 refills | Status: DC
  Filled 2020-10-23: qty 10, 5d supply, fill #0
  Filled 2020-12-27: qty 10, 5d supply, fill #1
  Filled 2021-01-12: qty 10, 5d supply, fill #2
  Filled 2021-03-31: qty 10, 5d supply, fill #3
  Filled 2021-04-09: qty 10, 5d supply, fill #4

## 2020-10-23 MED ORDER — DONEPEZIL HCL 10 MG PO TABS
10.0000 mg | ORAL_TABLET | Freq: Every day | ORAL | 3 refills | Status: DC
  Filled 2020-10-23: qty 90, 90d supply, fill #0
  Filled 2021-02-05: qty 90, 90d supply, fill #1
  Filled 2021-06-06: qty 90, 90d supply, fill #2
  Filled 2021-06-16 – 2021-10-01 (×2): qty 90, 90d supply, fill #3

## 2020-10-23 MED ORDER — POTASSIUM CHLORIDE CRYS ER 20 MEQ PO TBCR
20.0000 meq | EXTENDED_RELEASE_TABLET | Freq: Every day | ORAL | 3 refills | Status: DC
  Filled 2020-10-23: qty 90, 90d supply, fill #0
  Filled 2021-01-26: qty 90, 90d supply, fill #1
  Filled 2021-04-26: qty 90, 90d supply, fill #2
  Filled 2021-07-28: qty 90, 90d supply, fill #3

## 2020-10-23 MED ORDER — HYDRALAZINE HCL 50 MG PO TABS
ORAL_TABLET | ORAL | 3 refills | Status: DC
  Filled 2020-10-23: qty 135, 30d supply, fill #0
  Filled 2020-12-08: qty 135, 30d supply, fill #1
  Filled 2021-01-26: qty 135, 30d supply, fill #2
  Filled 2021-03-17: qty 135, 30d supply, fill #3

## 2020-10-23 MED ORDER — ALLOPURINOL 300 MG PO TABS
300.0000 mg | ORAL_TABLET | Freq: Every day | ORAL | 3 refills | Status: DC
  Filled 2020-10-23: qty 90, 90d supply, fill #0
  Filled 2021-02-05: qty 90, 90d supply, fill #1
  Filled 2021-09-11: qty 90, 90d supply, fill #2

## 2020-10-23 NOTE — Progress Notes (Signed)
   Subjective:   Patient ID: [name], female    DOB: 1945-10-28, 75 y.o.   MRN: [id number]  HPI The patient is a 75 YO female coming in for physical and some increase in number of gout flares recently.   PMH, [facility], social history reviewed and updated  Review of Systems  Unable to perform ROS: Dementia  Constitutional: Negative.   Respiratory: Negative.    Cardiovascular: Negative.   Gastrointestinal: Negative.   Musculoskeletal:  Positive for arthralgias, gait problem and myalgias.   Objective:  Physical Exam Constitutional:      Appearance: She is well-developed.  HENT:     Head: Normocephalic and atraumatic.  Cardiovascular:     Rate and Rhythm: Normal rate and regular rhythm.  Pulmonary:     Effort: Pulmonary effort is normal. No respiratory distress.     Breath sounds: Normal breath sounds. No wheezing or rales.  Abdominal:     General: Bowel sounds are normal. There is no distension.     Palpations: Abdomen is soft.     Tenderness: There is no abdominal tenderness. There is no rebound.  Musculoskeletal:        General: Tenderness present.     Cervical back: Normal range of motion.     Comments: No tenderness on exam, pain left knee recently.   Skin:    General: Skin is warm and dry.  Neurological:     Mental Status: She is alert. Mental status is at baseline.     Coordination: Coordination normal.    Vitals:   10/23/20 0804  BP: 128/80  Pulse: 62  Resp: 18  Temp: 97.8 F (36.6 C)  TempSrc: Oral  SpO2: 96%  Weight: 138 lb 6.4 oz (62.8 kg)  Height: 5\' 4"  (1.626 m)    This visit occurred during the SARS-CoV-2 public health emergency.  Safety protocols were in place, including screening questions prior to the visit, additional usage of staff PPE, and extensive cleaning of exam room while observing appropriate contact time as indicated for disinfecting solutions.   Assessment & Plan:  Flu shot given at visit

## 2020-10-23 NOTE — Patient Instructions (Signed)
We have given you the flu shot and will check the labs today.

## 2020-10-23 NOTE — Telephone Encounter (Signed)
CRITICAL VALUE STICKER  CRITICAL VALUE: Potassium 2.7  RECEIVER (on-site recipient of call): [place], CMA  DATE & TIME NOTIFIED: 10/23/2020 at 11:04 am  MESSENGER (representative from lab): Hope  MD NOTIFIED: Okey Dupre   TIME OF NOTIFICATION: 11:06 am   RESPONSE:

## 2020-10-26 ENCOUNTER — Ambulatory Visit (INDEPENDENT_AMBULATORY_CARE_PROVIDER_SITE_OTHER): Payer: Medicare Other | Admitting: Cardiology

## 2020-10-26 DIAGNOSIS — Z952 Presence of prosthetic heart valve: Secondary | ICD-10-CM

## 2020-10-26 DIAGNOSIS — Z5181 Encounter for therapeutic drug level monitoring: Secondary | ICD-10-CM | POA: Diagnosis not present

## 2020-10-26 LAB — POCT INR: INR: 2.1 (ref 2.0–3.0)

## 2020-10-27 DIAGNOSIS — Z0001 Encounter for general adult medical examination with abnormal findings: Secondary | ICD-10-CM | POA: Insufficient documentation

## 2020-10-27 NOTE — Assessment & Plan Note (Signed)
Flu shot given. Covid-19 booster counseled. Pneumonia declines 23 today. Shingrix declines. Tetanus declines. Colonoscopy declines. Mammogram declines, pap smear aged out and dexa declines further. Counseled about sun safety and mole surveillance. Counseled about the dangers of distracted driving. Given 10 year screening recommendations.

## 2020-10-27 NOTE — Assessment & Plan Note (Signed)
Checking uric acid today and will adjust dosing of allopurinol if needed. Refill prednisone for flares which they use prn. We did discuss that some pain could be coming from arthritis as well as gout.

## 2020-10-27 NOTE — Assessment & Plan Note (Signed)
Checking lipid panel and adjust lipitor 40 mg daily as needed for goal LDL <70. ?

## 2020-10-27 NOTE — Assessment & Plan Note (Signed)
Taking coumadin and follows with coumadin clinic for dosing through cardiology.

## 2020-10-27 NOTE — Assessment & Plan Note (Signed)
Checking CMP and adjust medications as needed.

## 2020-10-27 NOTE — Assessment & Plan Note (Signed)
Overall stable with significant impairment and unable to be left alone. They are working on living situation currently living with daughter.

## 2020-10-27 NOTE — Assessment & Plan Note (Signed)
BP at goal on coreg 3.125 mg bid and hydralazine 75 mg TID and imdur 30 mg daily and can refill this when needed. Taking lasix 20 mg daily as well which we will continue. Checking CMP and adjust as needed.

## 2020-11-01 ENCOUNTER — Other Ambulatory Visit (HOSPITAL_COMMUNITY): Payer: Self-pay

## 2020-11-02 ENCOUNTER — Telehealth: Payer: Self-pay | Admitting: Internal Medicine

## 2020-11-02 ENCOUNTER — Ambulatory Visit (INDEPENDENT_AMBULATORY_CARE_PROVIDER_SITE_OTHER): Payer: Medicare Other | Admitting: Pharmacist Clinician (PhC)/ Clinical Pharmacy Specialist

## 2020-11-02 DIAGNOSIS — Z7901 Long term (current) use of anticoagulants: Secondary | ICD-10-CM

## 2020-11-02 DIAGNOSIS — I4891 Unspecified atrial fibrillation: Secondary | ICD-10-CM | POA: Diagnosis not present

## 2020-11-02 LAB — POCT INR
INR: 2 (ref 2.0–3.0)
INR: 2.4 (ref 2.0–3.0)

## 2020-11-02 NOTE — Telephone Encounter (Signed)
Patient's daughter [name] requesting a call back to discuss fl2 form   Daughter also has questions about a test done on patient

## 2020-11-08 NOTE — Telephone Encounter (Signed)
Called [name]. LVM asking her to give our office a call back. Office number was provided.   If [name] calls find out if she has specific questions about the testing done in the hospital and if the [facility] needs to be changed.

## 2020-11-09 NOTE — Telephone Encounter (Signed)
Daughter [name] returning nurse call  Says she still has questions regarding the testing that was done at hosp bc they advised her they saw something abnormal.  Also says a new completed FL2 form is needed for the nursing facility bc the previous one has expired.. says it needs to be filled out exactly like the last FL2 form  Please call [phone]

## 2020-11-10 NOTE — Telephone Encounter (Signed)
See below

## 2020-11-13 NOTE — Telephone Encounter (Signed)
Daughter [name] requesting a call back to discuss fl2 form and hospital test results

## 2020-11-13 NOTE — Telephone Encounter (Signed)
I don't see any hospital or ER visits since our last visit so we would need records to review. Okay to fill out another FL2

## 2020-11-15 LAB — POCT INR: INR: 2.1 (ref 2.0–3.0)

## 2020-11-16 ENCOUNTER — Ambulatory Visit (INDEPENDENT_AMBULATORY_CARE_PROVIDER_SITE_OTHER): Payer: Medicare Other | Admitting: Cardiovascular Disease

## 2020-11-16 DIAGNOSIS — Z5181 Encounter for therapeutic drug level monitoring: Secondary | ICD-10-CM

## 2020-11-16 NOTE — Telephone Encounter (Signed)
Called [name]. LVM asking her to return my call to discuss the issue with FL2 and what question she has about hospital test results since Dr. Okey Dupre wasn't the ordering provider. Office number was provided.

## 2020-11-16 NOTE — Telephone Encounter (Signed)
[name] has called back and is requesting that FL2 form be filled out ASAP. States that the paperwork should be filled out exactly how it was last time when she was in room with pt. States she does not need a callback.

## 2020-11-20 NOTE — Telephone Encounter (Signed)
FL2 has been placed in [place]'s office for signature

## 2020-11-28 ENCOUNTER — Other Ambulatory Visit: Payer: Self-pay | Admitting: Internal Medicine

## 2020-11-28 ENCOUNTER — Other Ambulatory Visit (HOSPITAL_COMMUNITY): Payer: Self-pay

## 2020-11-28 MED ORDER — WARFARIN SODIUM 2 MG PO TABS
ORAL_TABLET | ORAL | 0 refills | Status: DC
  Filled 2020-11-28: qty 135, 90d supply, fill #0
  Filled 2021-03-30: qty 135, 90d supply, fill #1

## 2020-11-28 NOTE — Telephone Encounter (Signed)
Prescription refill request received for warfarin Lov: 08/10/20 [name]) Next INR check: 11/29/20 Warfarin tablet strength: 2mg   Appropriate dose and refill sent to requested pharmacy.

## 2020-11-29 ENCOUNTER — Telehealth: Payer: Self-pay

## 2020-11-29 ENCOUNTER — Other Ambulatory Visit (HOSPITAL_COMMUNITY): Payer: Self-pay

## 2020-11-29 NOTE — Telephone Encounter (Signed)
Left pt's daughter message to check INR

## 2020-12-01 LAB — POCT INR: INR: 2.4 (ref 2.0–3.0)

## 2020-12-04 ENCOUNTER — Ambulatory Visit (INDEPENDENT_AMBULATORY_CARE_PROVIDER_SITE_OTHER): Payer: Medicare Other | Admitting: Internal Medicine

## 2020-12-04 DIAGNOSIS — Z5181 Encounter for therapeutic drug level monitoring: Secondary | ICD-10-CM

## 2020-12-06 NOTE — Telephone Encounter (Signed)
done

## 2020-12-08 ENCOUNTER — Other Ambulatory Visit (HOSPITAL_COMMUNITY): Payer: Self-pay

## 2020-12-08 ENCOUNTER — Other Ambulatory Visit: Payer: Self-pay | Admitting: Internal Medicine

## 2020-12-08 MED ORDER — PANTOPRAZOLE SODIUM 40 MG PO TBEC
DELAYED_RELEASE_TABLET | Freq: Every day | ORAL | 1 refills | Status: DC
  Filled 2020-12-08: qty 90, 90d supply, fill #0
  Filled 2021-03-17: qty 90, 90d supply, fill #1

## 2020-12-09 ENCOUNTER — Other Ambulatory Visit (HOSPITAL_COMMUNITY): Payer: Self-pay

## 2020-12-21 ENCOUNTER — Ambulatory Visit (INDEPENDENT_AMBULATORY_CARE_PROVIDER_SITE_OTHER): Payer: Medicare Other | Admitting: Cardiology

## 2020-12-21 DIAGNOSIS — Z5181 Encounter for therapeutic drug level monitoring: Secondary | ICD-10-CM | POA: Diagnosis not present

## 2020-12-21 LAB — POCT INR: INR: 2.5 (ref 2.0–3.0)

## 2020-12-24 MED FILL — Carvedilol Tab 3.125 MG: ORAL | 90 days supply | Qty: 180 | Fill #2 | Status: AC

## 2020-12-25 ENCOUNTER — Other Ambulatory Visit (HOSPITAL_COMMUNITY): Payer: Self-pay

## 2020-12-26 ENCOUNTER — Other Ambulatory Visit (HOSPITAL_COMMUNITY): Payer: Self-pay

## 2020-12-28 ENCOUNTER — Other Ambulatory Visit (HOSPITAL_COMMUNITY): Payer: Self-pay

## 2021-01-04 ENCOUNTER — Telehealth: Payer: Self-pay | Admitting: *Deleted

## 2021-01-04 NOTE — Telephone Encounter (Signed)
Pt is due to have INR checked. Called pt and LMOM to call coumadin clinic back.

## 2021-01-05 ENCOUNTER — Ambulatory Visit (INDEPENDENT_AMBULATORY_CARE_PROVIDER_SITE_OTHER): Payer: Medicare Other | Admitting: Internal Medicine

## 2021-01-05 DIAGNOSIS — Z5181 Encounter for therapeutic drug level monitoring: Secondary | ICD-10-CM | POA: Diagnosis not present

## 2021-01-05 LAB — POCT INR: INR: 3.4 — AB (ref 2.0–3.0)

## 2021-01-11 ENCOUNTER — Ambulatory Visit (INDEPENDENT_AMBULATORY_CARE_PROVIDER_SITE_OTHER): Payer: Medicare HMO | Admitting: Cardiology

## 2021-01-11 DIAGNOSIS — Z5181 Encounter for therapeutic drug level monitoring: Secondary | ICD-10-CM

## 2021-01-11 LAB — POCT INR: INR: 2.1 (ref 2.0–3.0)

## 2021-01-12 ENCOUNTER — Other Ambulatory Visit (HOSPITAL_COMMUNITY): Payer: Self-pay

## 2021-01-25 ENCOUNTER — Ambulatory Visit (INDEPENDENT_AMBULATORY_CARE_PROVIDER_SITE_OTHER): Payer: Medicare HMO | Admitting: Internal Medicine

## 2021-01-25 DIAGNOSIS — Z5181 Encounter for therapeutic drug level monitoring: Secondary | ICD-10-CM | POA: Diagnosis not present

## 2021-01-25 LAB — POCT INR: INR: 2.3 (ref 2.0–3.0)

## 2021-01-26 ENCOUNTER — Other Ambulatory Visit (HOSPITAL_COMMUNITY): Payer: Self-pay

## 2021-02-05 ENCOUNTER — Other Ambulatory Visit (HOSPITAL_COMMUNITY): Payer: Self-pay

## 2021-02-08 ENCOUNTER — Ambulatory Visit (INDEPENDENT_AMBULATORY_CARE_PROVIDER_SITE_OTHER): Payer: Medicare HMO | Admitting: Cardiovascular Disease

## 2021-02-08 DIAGNOSIS — Z5181 Encounter for therapeutic drug level monitoring: Secondary | ICD-10-CM | POA: Diagnosis not present

## 2021-02-08 LAB — POCT INR: INR: 2.2 (ref 2.0–3.0)

## 2021-02-15 ENCOUNTER — Other Ambulatory Visit (HOSPITAL_COMMUNITY): Payer: Self-pay

## 2021-02-23 ENCOUNTER — Telehealth: Payer: Self-pay

## 2021-02-23 ENCOUNTER — Ambulatory Visit (INDEPENDENT_AMBULATORY_CARE_PROVIDER_SITE_OTHER): Payer: Medicare HMO | Admitting: Cardiovascular Disease

## 2021-02-23 DIAGNOSIS — Z5181 Encounter for therapeutic drug level monitoring: Secondary | ICD-10-CM

## 2021-02-23 LAB — POCT INR: INR: 2.4 (ref 2.0–3.0)

## 2021-02-23 NOTE — Telephone Encounter (Signed)
Lp's daughter message inquiring about pt's INR check.

## 2021-03-08 ENCOUNTER — Ambulatory Visit (INDEPENDENT_AMBULATORY_CARE_PROVIDER_SITE_OTHER): Payer: Medicare HMO | Admitting: Cardiology

## 2021-03-08 DIAGNOSIS — Z5181 Encounter for therapeutic drug level monitoring: Secondary | ICD-10-CM | POA: Diagnosis not present

## 2021-03-08 LAB — POCT INR: INR: 2.4 (ref 2.0–3.0)

## 2021-03-17 ENCOUNTER — Other Ambulatory Visit: Payer: Self-pay | Admitting: Internal Medicine

## 2021-03-17 ENCOUNTER — Other Ambulatory Visit (HOSPITAL_COMMUNITY): Payer: Self-pay

## 2021-03-19 ENCOUNTER — Other Ambulatory Visit (HOSPITAL_COMMUNITY): Payer: Self-pay

## 2021-03-19 MED ORDER — ISOSORBIDE MONONITRATE ER 30 MG PO TB24
ORAL_TABLET | Freq: Every day | ORAL | 2 refills | Status: DC
  Filled 2021-03-19: qty 90, 90d supply, fill #0
  Filled 2021-06-16: qty 90, 90d supply, fill #1
  Filled 2021-09-21: qty 90, 90d supply, fill #2

## 2021-03-22 ENCOUNTER — Ambulatory Visit (INDEPENDENT_AMBULATORY_CARE_PROVIDER_SITE_OTHER): Payer: Medicare HMO | Admitting: Pharmacist Clinician (PhC)/ Clinical Pharmacy Specialist

## 2021-03-22 DIAGNOSIS — Z7901 Long term (current) use of anticoagulants: Secondary | ICD-10-CM

## 2021-03-22 DIAGNOSIS — I4891 Unspecified atrial fibrillation: Secondary | ICD-10-CM | POA: Diagnosis not present

## 2021-03-22 LAB — POCT INR: INR: 3.2 — AB (ref 2.0–3.0)

## 2021-03-30 ENCOUNTER — Other Ambulatory Visit: Payer: Self-pay | Admitting: Internal Medicine

## 2021-03-30 ENCOUNTER — Other Ambulatory Visit (HOSPITAL_COMMUNITY): Payer: Self-pay

## 2021-03-30 MED ORDER — WARFARIN SODIUM 2 MG PO TABS
ORAL_TABLET | ORAL | 0 refills | Status: DC
Start: 2021-03-30 — End: 2021-08-20
  Filled 2021-03-30: qty 14, 9d supply, fill #0
  Filled 2021-03-31: qty 136, 81d supply, fill #0

## 2021-03-31 ENCOUNTER — Other Ambulatory Visit (HOSPITAL_COMMUNITY): Payer: Self-pay

## 2021-04-02 ENCOUNTER — Other Ambulatory Visit (HOSPITAL_COMMUNITY): Payer: Self-pay

## 2021-04-05 ENCOUNTER — Ambulatory Visit (INDEPENDENT_AMBULATORY_CARE_PROVIDER_SITE_OTHER): Payer: Medicare HMO | Admitting: Cardiology

## 2021-04-05 DIAGNOSIS — Z5181 Encounter for therapeutic drug level monitoring: Secondary | ICD-10-CM

## 2021-04-05 LAB — POCT INR: INR: 4.1 — AB (ref 2.0–3.0)

## 2021-04-09 ENCOUNTER — Other Ambulatory Visit (HOSPITAL_COMMUNITY): Payer: Self-pay

## 2021-04-09 ENCOUNTER — Other Ambulatory Visit: Payer: Self-pay | Admitting: Internal Medicine

## 2021-04-09 LAB — PROTIME-INR: INR: 4.1 — AB (ref 0.80–1.20)

## 2021-04-10 ENCOUNTER — Other Ambulatory Visit (HOSPITAL_COMMUNITY): Payer: Self-pay

## 2021-04-10 MED ORDER — CARVEDILOL 3.125 MG PO TABS
ORAL_TABLET | ORAL | 1 refills | Status: DC
  Filled 2021-04-10: qty 60, 30d supply, fill #0
  Filled 2021-05-08: qty 60, 30d supply, fill #1

## 2021-04-11 ENCOUNTER — Telehealth: Payer: Self-pay

## 2021-04-11 NOTE — Telephone Encounter (Signed)
Called [name]. LDVM asking her to give our office a call to discuss her mother's health. Office number was provided.  ?

## 2021-04-11 NOTE — Telephone Encounter (Signed)
Spoke with [name] and she stated that her mother is no longer getting up to use the restroom or walk around the house. She also mentioned that her memory has gotten worse and her appetite as well, she feels that her health overall is declining. [name] would like to hold off on feeling out FL2 due to them not having a facility in mind. I advised that if anything changes to give our office a call. She was very appreciative for the call.  ?

## 2021-04-11 NOTE — Telephone Encounter (Signed)
Pt brother is calling to requesting a FL2 form. ? ?Pt brother states that he feels that she maybe dying. ? ?Pt health has declined in the last couple of week.  ? ?Please Advise.  ? ?Pt brother states that [name] pt daughter has become to frustrated no know what to do with the pt since she has been in this condition. He stated that the will not even get out of the bed to use the bathroom.  ? ?I advise the brother that they may have to give a call back to [name] being that he is not on her DPR. He understood.  ? ? ?

## 2021-04-12 ENCOUNTER — Ambulatory Visit (INDEPENDENT_AMBULATORY_CARE_PROVIDER_SITE_OTHER): Payer: Medicare HMO | Admitting: Internal Medicine

## 2021-04-12 DIAGNOSIS — Z5181 Encounter for therapeutic drug level monitoring: Secondary | ICD-10-CM | POA: Diagnosis not present

## 2021-04-12 LAB — POCT INR: INR: 3.4 — AB (ref 2.0–3.0)

## 2021-04-18 ENCOUNTER — Other Ambulatory Visit (HOSPITAL_COMMUNITY): Payer: Self-pay

## 2021-04-18 ENCOUNTER — Telehealth: Payer: Self-pay

## 2021-04-18 ENCOUNTER — Other Ambulatory Visit: Payer: Self-pay | Admitting: Internal Medicine

## 2021-04-18 MED ORDER — PREDNISONE 20 MG PO TABS
ORAL_TABLET | Freq: Every day | ORAL | 4 refills | Status: DC
  Filled 2021-04-18: qty 10, 5d supply, fill #0
  Filled 2021-04-26: qty 10, 5d supply, fill #1
  Filled 2021-05-30: qty 10, 5d supply, fill #2
  Filled 2021-06-22: qty 10, 5d supply, fill #3
  Filled 2021-06-29: qty 10, 5d supply, fill #4

## 2021-04-18 NOTE — Telephone Encounter (Signed)
Sent in refill

## 2021-04-18 NOTE — Telephone Encounter (Signed)
Pt daughter [name] calling gout flare up in Rt wrist. Pt has been have back to back flare up this is the 4th one in the last month and a half.  ? ?Pt is in need of refill for: ?predniSONE (DELTASONE) 20 MG tablet ? ?Pharmacy: ?[facility] ? ?LOV 10/23/20 ?ROV 04/27/21 ? ?Pt daughter is requesting a call back [phone] ?[name] will be in meeting from 9:30-10:30 and will be unavailable. ? ?

## 2021-04-19 ENCOUNTER — Ambulatory Visit (INDEPENDENT_AMBULATORY_CARE_PROVIDER_SITE_OTHER): Payer: Medicare HMO | Admitting: Internal Medicine

## 2021-04-19 DIAGNOSIS — Z5181 Encounter for therapeutic drug level monitoring: Secondary | ICD-10-CM | POA: Diagnosis not present

## 2021-04-19 LAB — POCT INR: INR: 2.9 (ref 2.0–3.0)

## 2021-04-26 ENCOUNTER — Ambulatory Visit (INDEPENDENT_AMBULATORY_CARE_PROVIDER_SITE_OTHER): Payer: Medicare HMO | Admitting: Cardiology

## 2021-04-26 ENCOUNTER — Other Ambulatory Visit: Payer: Self-pay | Admitting: Internal Medicine

## 2021-04-26 ENCOUNTER — Other Ambulatory Visit (HOSPITAL_COMMUNITY): Payer: Self-pay

## 2021-04-26 DIAGNOSIS — Z5181 Encounter for therapeutic drug level monitoring: Secondary | ICD-10-CM | POA: Diagnosis not present

## 2021-04-26 LAB — POCT INR: INR: 3.2 — AB (ref 2.0–3.0)

## 2021-04-26 MED ORDER — FUROSEMIDE 20 MG PO TABS
ORAL_TABLET | ORAL | 3 refills | Status: DC
  Filled 2021-04-26: qty 90, 90d supply, fill #0
  Filled 2021-07-28: qty 90, 90d supply, fill #1

## 2021-04-27 ENCOUNTER — Ambulatory Visit: Payer: Medicare HMO | Admitting: Internal Medicine

## 2021-05-08 ENCOUNTER — Other Ambulatory Visit: Payer: Self-pay | Admitting: Internal Medicine

## 2021-05-08 ENCOUNTER — Other Ambulatory Visit (HOSPITAL_COMMUNITY): Payer: Self-pay

## 2021-05-08 MED ORDER — HYDRALAZINE HCL 50 MG PO TABS
ORAL_TABLET | ORAL | 3 refills | Status: DC
  Filled 2021-05-08: qty 135, 30d supply, fill #0
  Filled 2021-06-29: qty 135, 30d supply, fill #1
  Filled 2021-08-20: qty 135, 30d supply, fill #2
  Filled 2021-10-13: qty 135, 30d supply, fill #3

## 2021-05-10 ENCOUNTER — Ambulatory Visit (INDEPENDENT_AMBULATORY_CARE_PROVIDER_SITE_OTHER): Payer: Medicare HMO | Admitting: Cardiovascular Disease

## 2021-05-10 DIAGNOSIS — Z5181 Encounter for therapeutic drug level monitoring: Secondary | ICD-10-CM | POA: Diagnosis not present

## 2021-05-10 LAB — POCT INR: INR: 3.5 — AB (ref 2.0–3.0)

## 2021-05-11 ENCOUNTER — Ambulatory Visit: Payer: Medicare HMO | Admitting: Internal Medicine

## 2021-05-17 ENCOUNTER — Ambulatory Visit (INDEPENDENT_AMBULATORY_CARE_PROVIDER_SITE_OTHER): Payer: Medicare HMO | Admitting: Cardiovascular Disease

## 2021-05-17 DIAGNOSIS — Z5181 Encounter for therapeutic drug level monitoring: Secondary | ICD-10-CM | POA: Diagnosis not present

## 2021-05-17 LAB — POCT INR: INR: 3.3 — AB (ref 2.0–3.0)

## 2021-05-24 ENCOUNTER — Ambulatory Visit (INDEPENDENT_AMBULATORY_CARE_PROVIDER_SITE_OTHER): Payer: Medicare HMO | Admitting: Internal Medicine

## 2021-05-24 DIAGNOSIS — Z5181 Encounter for therapeutic drug level monitoring: Secondary | ICD-10-CM | POA: Diagnosis not present

## 2021-05-24 LAB — POCT INR: INR: 3.9 — AB (ref 2.0–3.0)

## 2021-05-30 ENCOUNTER — Other Ambulatory Visit (HOSPITAL_COMMUNITY): Payer: Self-pay

## 2021-05-31 ENCOUNTER — Telehealth: Payer: Self-pay

## 2021-05-31 NOTE — Telephone Encounter (Signed)
Pts daughter called and left [place] a msg stating that they are out of strips to self test but that they plan to test when they arrive. I will route this msg to the nl anticoag pool to make them aware.

## 2021-05-31 NOTE — Telephone Encounter (Signed)
noted 

## 2021-06-06 ENCOUNTER — Other Ambulatory Visit (HOSPITAL_COMMUNITY): Payer: Self-pay

## 2021-06-06 ENCOUNTER — Other Ambulatory Visit: Payer: Self-pay | Admitting: Internal Medicine

## 2021-06-06 MED ORDER — ATORVASTATIN CALCIUM 40 MG PO TABS
40.0000 mg | ORAL_TABLET | Freq: Every day | ORAL | 0 refills | Status: DC
  Filled 2021-06-06: qty 90, 90d supply, fill #0

## 2021-06-07 ENCOUNTER — Ambulatory Visit (INDEPENDENT_AMBULATORY_CARE_PROVIDER_SITE_OTHER): Payer: Medicare HMO | Admitting: Cardiology

## 2021-06-07 DIAGNOSIS — Z5181 Encounter for therapeutic drug level monitoring: Secondary | ICD-10-CM

## 2021-06-07 LAB — POCT INR: INR: 3.4 — AB (ref 2.0–3.0)

## 2021-06-14 LAB — POCT INR: INR: 2.5 (ref 2.0–3.0)

## 2021-06-15 ENCOUNTER — Ambulatory Visit: Payer: Medicare HMO | Admitting: Internal Medicine

## 2021-06-15 ENCOUNTER — Ambulatory Visit (INDEPENDENT_AMBULATORY_CARE_PROVIDER_SITE_OTHER): Payer: Medicare HMO

## 2021-06-15 DIAGNOSIS — Z7901 Long term (current) use of anticoagulants: Secondary | ICD-10-CM

## 2021-06-15 DIAGNOSIS — I4891 Unspecified atrial fibrillation: Secondary | ICD-10-CM | POA: Diagnosis not present

## 2021-06-15 NOTE — Patient Instructions (Addendum)
  Description   Spoke with pt's daughter. Instructed to increase greens while on Prednisone and continue taking 1 tablet Daily.  Recheck INR on Wednesday 06/20/21 SELF TESTER.  Coumadin Clinic [phone]

## 2021-06-16 ENCOUNTER — Other Ambulatory Visit: Payer: Self-pay | Admitting: Internal Medicine

## 2021-06-16 ENCOUNTER — Other Ambulatory Visit (HOSPITAL_COMMUNITY): Payer: Self-pay

## 2021-06-18 ENCOUNTER — Other Ambulatory Visit (HOSPITAL_COMMUNITY): Payer: Self-pay

## 2021-06-18 MED ORDER — CARVEDILOL 3.125 MG PO TABS
3.1250 mg | ORAL_TABLET | Freq: Two times a day (BID) | ORAL | 3 refills | Status: DC
  Filled 2021-06-18: qty 180, 90d supply, fill #0
  Filled 2021-10-01: qty 180, 90d supply, fill #1
  Filled 2022-01-12 (×2): qty 180, 90d supply, fill #2
  Filled 2022-04-27: qty 180, 90d supply, fill #3

## 2021-06-18 MED ORDER — PANTOPRAZOLE SODIUM 40 MG PO TBEC
DELAYED_RELEASE_TABLET | Freq: Every day | ORAL | 1 refills | Status: DC
  Filled 2021-06-18: qty 90, 90d supply, fill #0
  Filled 2021-09-21: qty 90, 90d supply, fill #1

## 2021-06-20 ENCOUNTER — Ambulatory Visit (INDEPENDENT_AMBULATORY_CARE_PROVIDER_SITE_OTHER): Payer: Medicare HMO | Admitting: *Deleted

## 2021-06-20 DIAGNOSIS — I4891 Unspecified atrial fibrillation: Secondary | ICD-10-CM | POA: Diagnosis not present

## 2021-06-20 LAB — POCT INR: INR: 3 (ref 2.0–3.0)

## 2021-06-22 ENCOUNTER — Other Ambulatory Visit (HOSPITAL_COMMUNITY): Payer: Self-pay

## 2021-06-29 ENCOUNTER — Other Ambulatory Visit (HOSPITAL_COMMUNITY): Payer: Self-pay

## 2021-07-05 ENCOUNTER — Ambulatory Visit (INDEPENDENT_AMBULATORY_CARE_PROVIDER_SITE_OTHER): Payer: Medicare HMO | Admitting: *Deleted

## 2021-07-05 DIAGNOSIS — I4891 Unspecified atrial fibrillation: Secondary | ICD-10-CM | POA: Diagnosis not present

## 2021-07-05 LAB — POCT INR: INR: 2.5 (ref 2.0–3.0)

## 2021-07-05 NOTE — Patient Instructions (Signed)
Description   Spoke with pt's daughter, [name], and instructed to continue taking 1 tablet daily. Recheck INR in 2 weeks-dtr aware we must speak to her or she must call back to confirm instructions.  SELF TESTER. Coumadin Clinic [phone]

## 2021-07-06 ENCOUNTER — Other Ambulatory Visit: Payer: Self-pay | Admitting: Internal Medicine

## 2021-07-06 ENCOUNTER — Other Ambulatory Visit (HOSPITAL_COMMUNITY): Payer: Self-pay

## 2021-07-06 MED ORDER — PREDNISONE 20 MG PO TABS
ORAL_TABLET | Freq: Every day | ORAL | 4 refills | Status: DC
  Filled 2021-07-06: qty 10, 5d supply, fill #0
  Filled 2021-07-20: qty 10, 5d supply, fill #1
  Filled 2021-07-28: qty 10, 5d supply, fill #2
  Filled 2021-08-07: qty 10, 5d supply, fill #3
  Filled 2021-09-08: qty 10, 5d supply, fill #4

## 2021-07-09 ENCOUNTER — Other Ambulatory Visit (HOSPITAL_COMMUNITY): Payer: Self-pay

## 2021-07-19 ENCOUNTER — Ambulatory Visit (INDEPENDENT_AMBULATORY_CARE_PROVIDER_SITE_OTHER): Payer: Medicare HMO | Admitting: *Deleted

## 2021-07-19 DIAGNOSIS — Z7901 Long term (current) use of anticoagulants: Secondary | ICD-10-CM

## 2021-07-19 DIAGNOSIS — I4891 Unspecified atrial fibrillation: Secondary | ICD-10-CM | POA: Diagnosis not present

## 2021-07-19 LAB — POCT INR: INR: 2.5 (ref 2.0–3.0)

## 2021-07-19 NOTE — Patient Instructions (Signed)
Description   Spoke with pt's daughter, [name], and instructed to continue taking 1 tablet daily. Recheck INR in 2 weeks-dtr aware we must speak to her or she must call back to confirm instructions.  SELF TESTER. Coumadin Clinic [phone]       

## 2021-07-20 ENCOUNTER — Other Ambulatory Visit (HOSPITAL_COMMUNITY): Payer: Self-pay

## 2021-07-24 ENCOUNTER — Telehealth: Payer: Self-pay | Admitting: Internal Medicine

## 2021-07-24 NOTE — Telephone Encounter (Signed)
I had spoke with PT's daughter in regards to a R/S. We had made the new appointment but PT's daughter had given me an FYI to relay back to you all.  PT is currently experiencing gout flare ups more frequently. She has been taking the rounds of medication but as soon as she is done it flares up again. PT's daughter is wanting to know if she would need to be seen sooner for this or what route to take regarding the gout?  CB: [phone]

## 2021-07-24 NOTE — Telephone Encounter (Signed)
Ok to be seen sooner. Is there any change in diet etc? Her last uric acid was at goal so it may be another cause of her pain rather than gout.

## 2021-07-25 NOTE — Telephone Encounter (Signed)
Spoke with [name] and she stated that there hasn't been any changes to her diet and that everything is still the same. She declined wanting to come in sooner to a work schedule conflict

## 2021-07-28 ENCOUNTER — Other Ambulatory Visit (HOSPITAL_COMMUNITY): Payer: Self-pay

## 2021-08-07 ENCOUNTER — Ambulatory Visit (INDEPENDENT_AMBULATORY_CARE_PROVIDER_SITE_OTHER): Payer: Medicare HMO | Admitting: Cardiology

## 2021-08-07 ENCOUNTER — Other Ambulatory Visit (HOSPITAL_COMMUNITY): Payer: Self-pay

## 2021-08-07 DIAGNOSIS — Z5181 Encounter for therapeutic drug level monitoring: Secondary | ICD-10-CM | POA: Diagnosis not present

## 2021-08-07 LAB — POCT INR: INR: 2.2 (ref 2.0–3.0)

## 2021-08-10 ENCOUNTER — Ambulatory Visit: Payer: Medicare HMO | Admitting: Internal Medicine

## 2021-08-14 ENCOUNTER — Telehealth: Payer: Self-pay | Admitting: Internal Medicine

## 2021-08-14 NOTE — Telephone Encounter (Signed)
Left message for patient to call back to schedule Medicare Annual Wellness Visit   Last AWV  05/23/17  Please schedule at anytime with [facility] Advisor if patient calls the office back.     Any questions, please call me at [phone]

## 2021-08-20 ENCOUNTER — Other Ambulatory Visit (HOSPITAL_COMMUNITY): Payer: Self-pay

## 2021-08-20 ENCOUNTER — Other Ambulatory Visit: Payer: Self-pay | Admitting: Internal Medicine

## 2021-08-20 MED ORDER — POTASSIUM CHLORIDE CRYS ER 20 MEQ PO TBCR
20.0000 meq | EXTENDED_RELEASE_TABLET | Freq: Every day | ORAL | 3 refills | Status: DC
  Filled 2021-08-20: qty 90, 90d supply, fill #0

## 2021-08-20 MED ORDER — WARFARIN SODIUM 2 MG PO TABS
ORAL_TABLET | ORAL | 0 refills | Status: DC
  Filled 2021-08-20: qty 135, 90d supply, fill #0
  Filled 2022-01-24: qty 15, 10d supply, fill #1

## 2021-08-21 ENCOUNTER — Other Ambulatory Visit (HOSPITAL_COMMUNITY): Payer: Self-pay

## 2021-08-23 ENCOUNTER — Ambulatory Visit (INDEPENDENT_AMBULATORY_CARE_PROVIDER_SITE_OTHER): Payer: Medicare HMO | Admitting: Cardiology

## 2021-08-23 DIAGNOSIS — Z5181 Encounter for therapeutic drug level monitoring: Secondary | ICD-10-CM

## 2021-08-23 LAB — POCT INR: INR: 4 — AB (ref 2.0–3.0)

## 2021-08-24 ENCOUNTER — Ambulatory Visit (INDEPENDENT_AMBULATORY_CARE_PROVIDER_SITE_OTHER): Payer: Medicare HMO | Admitting: Internal Medicine

## 2021-08-24 ENCOUNTER — Other Ambulatory Visit (HOSPITAL_COMMUNITY): Payer: Self-pay

## 2021-08-24 ENCOUNTER — Encounter: Payer: Self-pay | Admitting: Internal Medicine

## 2021-08-24 VITALS — BP 118/60 | HR 79 | Temp 98.7°F | Ht 64.0 in | Wt 138.0 lb

## 2021-08-24 DIAGNOSIS — M1A09X0 Idiopathic chronic gout, multiple sites, without tophus (tophi): Secondary | ICD-10-CM

## 2021-08-24 DIAGNOSIS — E782 Mixed hyperlipidemia: Secondary | ICD-10-CM

## 2021-08-24 DIAGNOSIS — N183 Chronic kidney disease, stage 3 unspecified: Secondary | ICD-10-CM | POA: Diagnosis not present

## 2021-08-24 DIAGNOSIS — I10 Essential (primary) hypertension: Secondary | ICD-10-CM

## 2021-08-24 DIAGNOSIS — I4891 Unspecified atrial fibrillation: Secondary | ICD-10-CM

## 2021-08-24 DIAGNOSIS — I5022 Chronic systolic (congestive) heart failure: Secondary | ICD-10-CM

## 2021-08-24 DIAGNOSIS — F0150 Vascular dementia without behavioral disturbance: Secondary | ICD-10-CM

## 2021-08-24 DIAGNOSIS — Z0001 Encounter for general adult medical examination with abnormal findings: Secondary | ICD-10-CM

## 2021-08-24 LAB — COMPREHENSIVE METABOLIC PANEL
ALT: 12 U/L (ref 0–35)
AST: 20 U/L (ref 0–37)
Albumin: 3.4 g/dL — ABNORMAL LOW (ref 3.5–5.2)
Alkaline Phosphatase: 100 U/L (ref 39–117)
BUN: 20 mg/dL (ref 6–23)
CO2: 29 mEq/L (ref 19–32)
Calcium: 10.3 mg/dL (ref 8.4–10.5)
Chloride: 104 mEq/L (ref 96–112)
Creatinine, Ser: 1.62 mg/dL — ABNORMAL HIGH (ref 0.40–1.20)
GFR: 31.22 mL/min — ABNORMAL LOW (ref >60.00)
Glucose, Bld: 169 mg/dL — ABNORMAL HIGH (ref 70–99)
Potassium: 3.7 mEq/L (ref 3.5–5.1)
Sodium: 141 mEq/L (ref 135–145)
Total Bilirubin: 1 mg/dL (ref 0.2–1.2)
Total Protein: 6.7 g/dL (ref 6.0–8.3)

## 2021-08-24 LAB — LIPID PANEL
Cholesterol: 173 mg/dL (ref 0–200)
HDL: 65.1 mg/dL (ref >39.00)
LDL Cholesterol: 85 mg/dL (ref 0–99)
NonHDL: 107.72
Total CHOL/HDL Ratio: 3
Triglycerides: 113 mg/dL (ref 0.0–149.0)
VLDL: 22.6 mg/dL (ref 0.0–40.0)

## 2021-08-24 LAB — CBC
HCT: 36.3 % (ref 36.0–46.0)
Hemoglobin: 11.7 g/dL — ABNORMAL LOW (ref 12.0–15.0)
MCHC: 32.2 g/dL (ref 30.0–36.0)
MCV: 85.7 fl (ref 78.0–100.0)
Platelets: 274 10*3/uL (ref 150.0–400.0)
RBC: 4.23 Mil/uL (ref 3.87–5.11)
RDW: 17.1 % — ABNORMAL HIGH (ref 11.5–15.5)
WBC: 5.3 10*3/uL (ref 4.0–10.5)

## 2021-08-24 LAB — URIC ACID: Uric Acid, Serum: 6.9 mg/dL (ref 2.4–7.0)

## 2021-08-24 LAB — TSH: TSH: 1.78 u[IU]/mL (ref 0.35–5.50)

## 2021-08-24 MED ORDER — POTASSIUM CHLORIDE CRYS ER 20 MEQ PO TBCR
20.0000 meq | EXTENDED_RELEASE_TABLET | Freq: Every day | ORAL | 3 refills | Status: DC
  Filled 2021-08-24 – 2021-11-10 (×2): qty 90, 90d supply, fill #0
  Filled 2022-04-27: qty 90, 90d supply, fill #1

## 2021-08-24 NOTE — Assessment & Plan Note (Signed)
BP at goal on hydralazine and coreg. Will stop lasix due to excessive urination and dementia causing problems.

## 2021-08-24 NOTE — Progress Notes (Signed)
Subjective:   Patient ID: [name], female    DOB: [birth year], 76 y.o.   MRN: [id number]  HPI Here for medicare wellness and physical, no new complaints. Please see A/P for status and treatment of chronic medical problems.   Diet: heart healthy Physical activity: sedentary Depression/mood screen: unable to assess Hearing: intact to whispered voice Visual acuity: grossly normal, overdue annual eye exam  ADLs: capable with some assist Fall risk: none Home safety: adequate Cognitive evaluation: intact to person only EOL planning: adv directives discussed, in place but no financial poa discussed with daughter this my be beneficial  Flowsheet Row Patient Outreach Telephone from 07/07/2019 in [facility]  PHQ-2 Total Score 1       Flowsheet Row Clinical Support from 05/23/2017 in [facility]  PHQ-9 Total Score 1         10/15/2016    4:04 PM 05/23/2017    9:40 AM 07/06/2019    4:45 PM 07/07/2019    1:57 PM 09/09/2020    9:17 AM  Fall Risk  Falls in the past year? No No 0 0   Was there an injury with Fall?   0 0   Fall Risk Category Calculator   0 0   Fall Risk Category   Low Low   Patient Fall Risk Level    Moderate fall risk High fall risk  Patient at Risk for Falls Due to    Mental status change   Fall risk Follow up    Education provided;Falls prevention discussed     I have personally reviewed and have noted 1. The patient's medical and social history - reviewed today no changes 2. Their use of alcohol, tobacco or illicit drugs 3. Their current medications and supplements 4. The patient's functional ability including ADL's, fall risks, home safety risks and hearing or visual impairment. 5. Diet and physical activities 6. Evidence for depression or mood disorders 7. Care team reviewed and updated 8.  The patient is not on an opioid pain medication.   Patient Care Team: Myrlene Broker, MD as PCP - General  (Internal Medicine) Chrystie Nose, MD as PCP - Cardiology (Cardiology) Ellin Saba, [facility] (Inactive) as Pharmacist (Pharmacist) Past Medical History:  Diagnosis Date   Acute CHF (congestive heart failure) (HCC) 12/14/2014   Hyperlipidemia    Hypertension    Prosthetic valve dysfunction 07/22/2014   Acute mitral stenosis due to restricted leaflet mobility of bileaflet mechanical mitral valve prosthesis   S/P MVR (mitral valve replacement) 04/11/1998   #29 St. 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   Valvular endocarditis 2000   Streptococcus   Past Surgical History:  Procedure Laterality Date   ABDOMINAL HYSTERECTOMY     CARDIAC CATHETERIZATION N/A 07/24/2014   Procedure: Right/Left Heart Cath and Coronary Angiography;  Surgeon: Iran Ouch, MD;  Location: [facility] INVASIVE CV LAB;  Service: Cardiovascular;  Laterality: N/A;   CARDIAC CATHETERIZATION N/A 07/22/2014   Procedure: Fluoroscopy Guidance;  Surgeon: Lennette Bihari, MD;  Location: [facility] INVASIVE CV LAB;  Service: Cardiovascular;  Laterality: N/A;   CARDIAC CATHETERIZATION N/A 12/16/2014   Procedure: Right Heart Cath;  Surgeon: Laurey Morale, MD;  Location: [facility] INVASIVE CV LAB;  Service: Cardiovascular;  Laterality:  N/A;   CARDIAC VALVE REPLACEMENT     MITRAL VALVE REPLACEMENT  04/11/1998   St. Jude mechanical MVR (severe MR, episode of streptococcal bacterial endocarditis - Dr. Cornelius Moras   MITRAL VALVE REPLACEMENT N/A 07/25/2014   Procedure: REDO MITRAL VALVE REPLACEMENT (MVR);  Surgeon: Purcell Nails, MD;  Location: [facility] OR;  Service: Open Heart Surgery;  Laterality: N/A;   TOTAL ABDOMINAL HYSTERECTOMY W/ BILATERAL SALPINGOOPHORECTOMY  12/14/1998   TRANSTHORACIC ECHOCARDIOGRAM  11/2009   EF=>55%; bi-leaflet St. Jude mech  prosthesis; mild TR, RVSP elevated at 40-5mmHg, mod pulm HTN; trace AV regurg; mild pulm valve regurg   TUBAL LIGATION     Family History  Problem Relation Age of Onset   Lung cancer Mother    Heart attack Mother    Heart failure Maternal Grandmother    Heart failure Maternal Grandfather    Stroke Maternal Grandfather    Stroke Father    Colon cancer Father    Diabetes Father    Stroke Sister    Lung disease Neg Hx    Review of Systems  Unable to perform ROS: Dementia  Constitutional: Negative.   HENT: Negative.    Eyes: Negative.   Respiratory:  Negative for cough, chest tightness and shortness of breath.   Cardiovascular:  Negative for chest pain, palpitations and leg swelling.  Gastrointestinal:  Negative for abdominal distention, abdominal pain, constipation, diarrhea, nausea and vomiting.  Musculoskeletal: Negative.   Skin: Negative.   Neurological: Negative.   Psychiatric/Behavioral: Negative.      Objective:  Physical Exam Constitutional:      Appearance: She is well-developed.  HENT:     Head: Normocephalic and atraumatic.  Cardiovascular:     Rate and Rhythm: Normal rate and regular rhythm.  Pulmonary:     Effort: Pulmonary effort is normal. No respiratory distress.     Breath sounds: Normal breath sounds. No wheezing or rales.  Abdominal:     General: Bowel sounds are normal. There is no distension.     Palpations: Abdomen is soft.     Tenderness: There is no abdominal tenderness. There is no rebound.  Musculoskeletal:     Cervical back: Normal range of motion.  Skin:    General: Skin is warm and dry.  Neurological:     Mental Status: She is alert and oriented to person, place, and time.     Coordination: Coordination normal.     Vitals:   08/24/21 0855  BP: 118/60  Pulse: 79  Temp: 98.7 F (37.1 C)  TempSrc: Oral  SpO2: 97%  Weight: 138 lb (62.6 kg)  Height: 5\' 4"  (1.626 m)    Assessment & Plan:

## 2021-08-24 NOTE — Assessment & Plan Note (Signed)
Checking uric acid and adjust allopurinol 300 mg daily as needed. Uses prednisone for flare when needed.

## 2021-08-24 NOTE — Assessment & Plan Note (Addendum)
Taking coumadin for anticoagulation. Taking coreg 3.125 mg BID for rate control. Checking TSH.

## 2021-08-24 NOTE — Assessment & Plan Note (Signed)
Checking CMP and adjust as needed. BP at goal no DM.

## 2021-08-24 NOTE — Assessment & Plan Note (Signed)
No fluid overload in some time and the lasix is causing significant burden with her dementia she is unable to remember to go to bathroom. Will stop lasix and use prn swelling. Discussed with daughter to monitor for weight and fluid status. Let us know if changed. Keep imdur and hydralazine and coreg and lipitor and aspirin daily. No chest pain.

## 2021-08-24 NOTE — Assessment & Plan Note (Signed)
Checking lipid panel and adjust lipitor as needed for LDL <100 goal.

## 2021-08-24 NOTE — Patient Instructions (Signed)
It is okay to stop the furosemide and if swelling or weight goes up more than 2 pounds take for 2-3 days then stop.

## 2021-08-24 NOTE — Assessment & Plan Note (Signed)
Progressing some since last year and is struggling with going to the bathroom independently. She is not having any behavioral issues. Will do FL-2 as needed to assess for respite care. Daughter has upcoming surgery Nov 2023.

## 2021-08-24 NOTE — Assessment & Plan Note (Signed)
Flu shot counseled. Covid-19 counseled. Pneumonia has had 13 and 23 previous. Shingrix declines. Tetanus declines. Colonoscopy no further given health. Mammogram no further given health, pap smear aged out and dexa no further given health. Counseled about sun safety and mole surveillance. Counseled about the dangers of distracted driving. Given 10 year screening recommendations.

## 2021-08-27 ENCOUNTER — Telehealth: Payer: Self-pay

## 2021-08-27 NOTE — Telephone Encounter (Signed)
Pt daughter is calling about the FL2 form that was discussed at visit 8/18. Pt daughter states she hasn't received the e-mail as of yet.  [name] is requesting a call back with an update  Please advise

## 2021-08-28 NOTE — Telephone Encounter (Signed)
Pt daughter [name] called in today requesting a call from Dr. Lawana Chambers nurse about FL2 form. She said she can be reached at [phone].

## 2021-08-29 NOTE — Telephone Encounter (Signed)
Spoke with pts daughter and was able to answer her questions.

## 2021-08-30 ENCOUNTER — Telehealth: Payer: Self-pay

## 2021-08-30 ENCOUNTER — Ambulatory Visit (INDEPENDENT_AMBULATORY_CARE_PROVIDER_SITE_OTHER): Payer: Medicare HMO | Admitting: Cardiology

## 2021-08-30 DIAGNOSIS — Z5181 Encounter for therapeutic drug level monitoring: Secondary | ICD-10-CM

## 2021-08-30 LAB — POCT INR: INR: 2 (ref 2.0–3.0)

## 2021-08-30 NOTE — Telephone Encounter (Signed)
Pt daughter is calling requesting an update on the FL2 form. She states it can be emailed to: Email:[email or web address].  Also, a Referral for respite care is needed for [facility]. Duration for 20 days continuous due to caregiver having surgery.   [place] [phone] [place]  Insurance company states that it would be covered but requires a referral.  Please advise

## 2021-08-30 NOTE — Telephone Encounter (Signed)
FL2 has been emailed.

## 2021-08-30 NOTE — Telephone Encounter (Signed)
FL2 has been emailed. 

## 2021-09-04 ENCOUNTER — Telehealth: Payer: Self-pay

## 2021-09-04 DIAGNOSIS — M1A09X0 Idiopathic chronic gout, multiple sites, without tophus (tophi): Secondary | ICD-10-CM

## 2021-09-04 NOTE — Telephone Encounter (Signed)
Pts daughter states she is in agreeance with upping the dose on the pts allopurinol (ZYLOPRIM) 300 MG tablet

## 2021-09-05 ENCOUNTER — Other Ambulatory Visit (HOSPITAL_COMMUNITY): Payer: Self-pay

## 2021-09-05 MED ORDER — ALLOPURINOL 100 MG PO TABS
100.0000 mg | ORAL_TABLET | Freq: Every day | ORAL | 3 refills | Status: DC
  Filled 2021-09-05 – 2021-09-08 (×2): qty 90, 90d supply, fill #0
  Filled 2022-01-14: qty 90, 90d supply, fill #1
  Filled 2022-04-27: qty 90, 90d supply, fill #2

## 2021-09-05 NOTE — Telephone Encounter (Signed)
We have sent in 100 mg allopurinol to add to 300 mg daily for total allopurinol of 400 mg daily. Recheck uric acid in 1-2 months. Labs placed.

## 2021-09-06 NOTE — Telephone Encounter (Signed)
Spoke with pts daughter and was able to inform her of the medication changes and instructions per Dr. Okey Dupre.

## 2021-09-08 ENCOUNTER — Other Ambulatory Visit (HOSPITAL_COMMUNITY): Payer: Self-pay

## 2021-09-11 ENCOUNTER — Other Ambulatory Visit (HOSPITAL_COMMUNITY): Payer: Self-pay

## 2021-09-13 ENCOUNTER — Ambulatory Visit (INDEPENDENT_AMBULATORY_CARE_PROVIDER_SITE_OTHER): Payer: Medicare HMO | Admitting: Cardiology

## 2021-09-13 DIAGNOSIS — Z5181 Encounter for therapeutic drug level monitoring: Secondary | ICD-10-CM | POA: Diagnosis not present

## 2021-09-13 LAB — POCT INR: INR: 4.3 — AB (ref 2.0–3.0)

## 2021-09-15 ENCOUNTER — Other Ambulatory Visit (HOSPITAL_COMMUNITY): Payer: Self-pay

## 2021-09-20 ENCOUNTER — Ambulatory Visit (INDEPENDENT_AMBULATORY_CARE_PROVIDER_SITE_OTHER): Payer: Medicare HMO | Admitting: Cardiology

## 2021-09-20 DIAGNOSIS — Z5181 Encounter for therapeutic drug level monitoring: Secondary | ICD-10-CM | POA: Diagnosis not present

## 2021-09-20 LAB — POCT INR: INR: 2.9 (ref 2.0–3.0)

## 2021-09-21 ENCOUNTER — Other Ambulatory Visit (HOSPITAL_COMMUNITY): Payer: Self-pay

## 2021-10-01 ENCOUNTER — Other Ambulatory Visit: Payer: Self-pay | Admitting: Internal Medicine

## 2021-10-01 ENCOUNTER — Other Ambulatory Visit (HOSPITAL_COMMUNITY): Payer: Self-pay

## 2021-10-01 MED ORDER — ATORVASTATIN CALCIUM 40 MG PO TABS
40.0000 mg | ORAL_TABLET | Freq: Every day | ORAL | 0 refills | Status: DC
  Filled 2021-10-01: qty 90, 90d supply, fill #0

## 2021-10-11 ENCOUNTER — Ambulatory Visit (INDEPENDENT_AMBULATORY_CARE_PROVIDER_SITE_OTHER): Payer: Medicare HMO | Admitting: Cardiology

## 2021-10-11 DIAGNOSIS — Z5181 Encounter for therapeutic drug level monitoring: Secondary | ICD-10-CM | POA: Diagnosis not present

## 2021-10-11 LAB — POCT INR: INR: 3.1 — AB (ref 2.0–3.0)

## 2021-10-13 ENCOUNTER — Other Ambulatory Visit (HOSPITAL_COMMUNITY): Payer: Self-pay

## 2021-10-14 ENCOUNTER — Other Ambulatory Visit: Payer: Self-pay | Admitting: Internal Medicine

## 2021-10-15 ENCOUNTER — Other Ambulatory Visit (HOSPITAL_COMMUNITY): Payer: Self-pay

## 2021-10-15 MED ORDER — PREDNISONE 20 MG PO TABS
40.0000 mg | ORAL_TABLET | Freq: Every day | ORAL | 4 refills | Status: DC
  Filled 2021-10-15 – 2021-11-10 (×2): qty 10, 5d supply, fill #0
  Filled 2021-12-03: qty 10, 5d supply, fill #1
  Filled 2021-12-16: qty 10, 5d supply, fill #2
  Filled 2021-12-24: qty 10, 5d supply, fill #3
  Filled 2021-12-29: qty 10, 5d supply, fill #4

## 2021-10-25 ENCOUNTER — Ambulatory Visit (INDEPENDENT_AMBULATORY_CARE_PROVIDER_SITE_OTHER): Payer: Medicare HMO | Admitting: Cardiology

## 2021-10-25 DIAGNOSIS — Z5181 Encounter for therapeutic drug level monitoring: Secondary | ICD-10-CM

## 2021-10-25 LAB — POCT INR: INR: 2.2 (ref 2.0–3.0)

## 2021-11-03 ENCOUNTER — Other Ambulatory Visit (HOSPITAL_COMMUNITY): Payer: Self-pay

## 2021-11-08 ENCOUNTER — Ambulatory Visit (INDEPENDENT_AMBULATORY_CARE_PROVIDER_SITE_OTHER): Payer: Medicare HMO | Admitting: Cardiology

## 2021-11-08 DIAGNOSIS — Z5181 Encounter for therapeutic drug level monitoring: Secondary | ICD-10-CM | POA: Diagnosis not present

## 2021-11-08 LAB — POCT INR: INR: 2.1 (ref 2.0–3.0)

## 2021-11-10 ENCOUNTER — Other Ambulatory Visit: Payer: Self-pay | Admitting: Internal Medicine

## 2021-11-10 ENCOUNTER — Other Ambulatory Visit (HOSPITAL_COMMUNITY): Payer: Self-pay

## 2021-11-21 ENCOUNTER — Other Ambulatory Visit (HOSPITAL_COMMUNITY): Payer: Self-pay

## 2021-11-22 ENCOUNTER — Ambulatory Visit (INDEPENDENT_AMBULATORY_CARE_PROVIDER_SITE_OTHER): Payer: Medicare HMO

## 2021-11-22 DIAGNOSIS — Z5181 Encounter for therapeutic drug level monitoring: Secondary | ICD-10-CM

## 2021-11-22 DIAGNOSIS — I4891 Unspecified atrial fibrillation: Secondary | ICD-10-CM

## 2021-11-22 LAB — POCT INR: INR: 2.6 (ref 2.0–3.0)

## 2021-11-22 NOTE — Patient Instructions (Signed)
Description   Spoke with pt's daughter, [name], and instructed to continue taking 1 tablet daily and Recheck INR in 2 weeks-dtr aware we must speak to her or she must call back to confirm instructions.  [facility]. [facility] [phone]

## 2021-11-23 ENCOUNTER — Emergency Department (HOSPITAL_COMMUNITY)
Admission: EM | Admit: 2021-11-23 | Discharge: 2021-11-23 | Disposition: A | Discharge status: Home / Self Care | Payer: Medicare HMO | Attending: Emergency Medicine | Admitting: Emergency Medicine

## 2021-11-23 ENCOUNTER — Emergency Department (HOSPITAL_COMMUNITY): Payer: Medicare HMO

## 2021-11-23 DIAGNOSIS — I10 Essential (primary) hypertension: Secondary | ICD-10-CM | POA: Diagnosis not present

## 2021-11-23 DIAGNOSIS — S80211A Abrasion, right knee, initial encounter: Secondary | ICD-10-CM | POA: Diagnosis not present

## 2021-11-23 DIAGNOSIS — S0990XA Unspecified injury of head, initial encounter: Secondary | ICD-10-CM | POA: Insufficient documentation

## 2021-11-23 DIAGNOSIS — S3993XA Unspecified injury of pelvis, initial encounter: Secondary | ICD-10-CM | POA: Diagnosis not present

## 2021-11-23 DIAGNOSIS — Z7901 Long term (current) use of anticoagulants: Secondary | ICD-10-CM | POA: Diagnosis not present

## 2021-11-23 DIAGNOSIS — Z743 Need for continuous supervision: Secondary | ICD-10-CM | POA: Diagnosis not present

## 2021-11-23 DIAGNOSIS — T699XXA Effect of reduced temperature, unspecified, initial encounter: Secondary | ICD-10-CM | POA: Diagnosis not present

## 2021-11-23 DIAGNOSIS — R0689 Other abnormalities of breathing: Secondary | ICD-10-CM | POA: Diagnosis not present

## 2021-11-23 DIAGNOSIS — Z79899 Other long term (current) drug therapy: Secondary | ICD-10-CM | POA: Diagnosis not present

## 2021-11-23 DIAGNOSIS — Z7982 Long term (current) use of aspirin: Secondary | ICD-10-CM | POA: Insufficient documentation

## 2021-11-23 DIAGNOSIS — W19XXXA Unspecified fall, initial encounter: Secondary | ICD-10-CM | POA: Insufficient documentation

## 2021-11-23 DIAGNOSIS — F0390 Unspecified dementia without behavioral disturbance: Secondary | ICD-10-CM | POA: Insufficient documentation

## 2021-11-23 DIAGNOSIS — S199XXA Unspecified injury of neck, initial encounter: Secondary | ICD-10-CM | POA: Diagnosis not present

## 2021-11-23 DIAGNOSIS — S8991XA Unspecified injury of right lower leg, initial encounter: Secondary | ICD-10-CM | POA: Diagnosis not present

## 2021-11-23 DIAGNOSIS — R4182 Altered mental status, unspecified: Secondary | ICD-10-CM | POA: Diagnosis not present

## 2021-11-23 DIAGNOSIS — S0291XA Unspecified fracture of skull, initial encounter for closed fracture: Secondary | ICD-10-CM | POA: Diagnosis not present

## 2021-11-23 DIAGNOSIS — M11261 Other chondrocalcinosis, right knee: Secondary | ICD-10-CM | POA: Diagnosis not present

## 2021-11-23 DIAGNOSIS — R6889 Other general symptoms and signs: Secondary | ICD-10-CM | POA: Diagnosis not present

## 2021-11-23 DIAGNOSIS — Z952 Presence of prosthetic heart valve: Secondary | ICD-10-CM | POA: Diagnosis not present

## 2021-11-23 DIAGNOSIS — R918 Other nonspecific abnormal finding of lung field: Secondary | ICD-10-CM | POA: Diagnosis not present

## 2021-11-23 LAB — CBC WITH DIFFERENTIAL/PLATELET
Abs Immature Granulocytes: 0.01 10*3/uL (ref 0.00–0.07)
Basophils Absolute: 0.1 10*3/uL (ref 0.0–0.1)
Basophils Relative: 1 %
Eosinophils Absolute: 0.1 10*3/uL (ref 0.0–0.5)
Eosinophils Relative: 2 %
HCT: 40.5 % (ref 36.0–46.0)
Hemoglobin: 12.6 g/dL (ref 12.0–15.0)
Immature Granulocytes: 0 %
Lymphocytes Relative: 21 %
Lymphs Abs: 1 10*3/uL (ref 0.7–4.0)
MCH: 27.3 pg (ref 26.0–34.0)
MCHC: 31.1 g/dL (ref 30.0–36.0)
MCV: 87.7 fL (ref 80.0–100.0)
Monocytes Absolute: 0.6 10*3/uL (ref 0.1–1.0)
Monocytes Relative: 12 %
Neutro Abs: 3 10*3/uL (ref 1.7–7.7)
Neutrophils Relative %: 64 %
Platelets: 258 10*3/uL (ref 150–400)
RBC: 4.62 MIL/uL (ref 3.87–5.11)
RDW: 15 % (ref 11.5–15.5)
WBC: 4.7 10*3/uL (ref 4.0–10.5)
nRBC: 0 % (ref 0.0–0.2)

## 2021-11-23 LAB — BASIC METABOLIC PANEL
Anion gap: 13 (ref 5–15)
BUN: 10 mg/dL (ref 8–23)
CO2: 24 mmol/L (ref 22–32)
Calcium: 10.9 mg/dL — ABNORMAL HIGH (ref 8.9–10.3)
Chloride: 104 mmol/L (ref 98–111)
Creatinine, Ser: 1.18 mg/dL — ABNORMAL HIGH (ref 0.44–1.00)
GFR, Estimated: 48 mL/min — ABNORMAL LOW (ref >60)
Glucose, Bld: 107 mg/dL — ABNORMAL HIGH (ref 70–99)
Potassium: 4 mmol/L (ref 3.5–5.1)
Sodium: 141 mmol/L (ref 135–145)

## 2021-11-23 LAB — PROTIME-INR
INR: 2.6 — ABNORMAL HIGH (ref 0.8–1.2)
Prothrombin Time: 27.5 seconds — ABNORMAL HIGH (ref 11.4–15.2)

## 2021-11-23 NOTE — ED Notes (Signed)
Patient verbalizes understanding of discharge instructions. Opportunity for questioning and answers were provided. Armband removed by staff, pt discharged from ED. Pt taken to ED entrance via wheel chair.  

## 2021-11-23 NOTE — ED Provider Notes (Signed)
[facility] EMERGENCY DEPARTMENT Provider Note   CSN: [id number] Arrival date & time: 11/23/21  1952     History  Chief Complaint  Patient presents with   Fall    [name] is a 76 y.o. female.  Level 5 caveat due to dementia.  Patient lives at home with daughter.  She has dementia where she is not able to provide much detail.  She is on Coumadin for valve replacement.  History of hypertension high cholesterol and stroke.  She was ambulatory with EMS but when family got home from work today patient was on the floor in the kitchen.  Unable to provide history of what happened.  There is no obvious signs of trauma on exam.  Patient has no complaints.  Seems to be at her baseline per family.  Currently family has no social support at home to help take care of her.  She is able to ambulate but there is no home health aide or skilled nursing at home.  No obvious deformities on exam.  The history is provided by the patient.       Home Medications Prior to Admission medications   Medication Sig Start Date End Date Taking? Authorizing Provider  acetaminophen (TYLENOL) 325 MG tablet Take 2 tablets (650 mg total) by mouth every 6 (six) hours as needed (pain, fever, headache, body aches). 09/09/20   Edwin Dada P, DO  allopurinol (ZYLOPRIM) 100 MG tablet Take 1 tablet (100 mg total) by mouth daily. Take in addition to 300 mg for total daily dose of 400 mg allopurinol. 09/05/21   Myrlene Broker, MD  allopurinol (ZYLOPRIM) 300 MG tablet Take 1 tablet (300 mg total) by mouth daily. 10/23/20   Myrlene Broker, MD  aspirin EC 81 MG tablet Take 1 tablet (81 mg total) by mouth daily. 02/24/15   Jerald Kief, MD  atorvastatin (LIPITOR) 40 MG tablet Take 1 tablet (40 mg total) by mouth daily. 10/01/21   Runell Gess, MD  carvedilol (COREG) 3.125 MG tablet Take 1 tablet (3.125 mg total) by mouth 2 (two) times daily with a meal. 06/18/21 06/18/22  Lennette Bihari, MD  donepezil (ARICEPT) 10 MG tablet Take 1 tablet (10 mg total) by mouth at bedtime. 10/23/20   Myrlene Broker, MD  furosemide (LASIX) 20 MG tablet TAKE 1 TABLET BY MOUTH ONCE A DAY (KEEP APPT) 04/26/21 04/26/22  Chrystie Nose, MD  hydrALAZINE (APRESOLINE) 50 MG tablet TAKE 1 & 1/2 TABLETS BY MOUTH 3 TIMES DAILY 05/08/21 05/08/22  Myrlene Broker, MD  isosorbide mononitrate (IMDUR) 30 MG 24 hr tablet TAKE 1 TABLET BY MOUTH ONCE A DAY 03/19/21 03/19/22  Hilty, Lisette Abu, MD  ondansetron (ZOFRAN) 4 MG tablet Take 1 tablet (4 mg total) by mouth every 6 (six) hours. 09/09/20   Edwin Dada P, DO  pantoprazole (PROTONIX) 40 MG tablet TAKE 1 TABLET BY MOUTH ONCE DAILY 06/18/21 06/18/22  Hilty, Lisette Abu, MD  potassium chloride SA (KLOR-CON M) 20 MEQ tablet Take 1 tablet (20 mEq total) by mouth daily. 08/24/21   Myrlene Broker, MD  predniSONE (DELTASONE) 20 MG tablet Take 2 tablets (40 mg total) by mouth daily with breakfast. 10/15/21   Myrlene Broker, MD  warfarin (COUMADIN) 2 MG tablet TAKE 1 TO 1 & 1/2 TABLETS BY MOUTH DAILY OR AS DIRECTED BY COUMADIN CLINIC 08/20/21 11/20/21  Hilty, Lisette Abu, MD      Allergies  Augmentin [amoxicillin-pot clavulanate]    Review of Systems   Review of Systems  Physical Exam Updated Vital Signs BP 139/80   Pulse (!) 54   Temp 98.3 F (36.8 C) (Oral)   Resp (!) 27   Ht  (1.626 m)   Wt 62.6 kg   LMP  (LMP Unknown)   SpO2 96%   BMI 23.69 kg/m  Physical Exam Vitals and nursing note reviewed.  Constitutional:      General: She is not in acute distress.    Appearance: She is well-developed. She is not ill-appearing.  HENT:     Head: Normocephalic and atraumatic.     Nose: Nose normal.     Mouth/Throat:     Mouth: Mucous membranes are moist.  Eyes:     Extraocular Movements: Extraocular movements intact.     Conjunctiva/sclera: Conjunctivae normal.     Pupils: Pupils are equal, round, and reactive to light.   Cardiovascular:     Rate and Rhythm: Normal rate and regular rhythm.     Pulses: Normal pulses.     Heart sounds: Normal heart sounds. No murmur heard. Pulmonary:     Effort: Pulmonary effort is normal. No respiratory distress.     Breath sounds: Normal breath sounds.  Abdominal:     Palpations: Abdomen is soft.     Tenderness: There is no abdominal tenderness.  Musculoskeletal:        General: No swelling or tenderness.     Cervical back: Normal range of motion and neck supple.  Skin:    General: Skin is warm and dry.     Capillary Refill: Capillary refill takes less than 2 seconds.     Comments: Small abrasion to the right knee  Neurological:     General: No focal deficit present.     Mental Status: She is alert.     Comments: Patient moves all extremities without any issues, seems to have normal speech, no facial droop  Psychiatric:        Mood and Affect: Mood normal.     ED Results / Procedures / Treatments   Labs (all labs ordered are listed, but only abnormal results are displayed) Labs Reviewed  BASIC METABOLIC PANEL - Abnormal; Notable for the following components:      Result Value   Glucose, Bld 107 (*)    Creatinine, Ser 1.18 (*)    Calcium 10.9 (*)    GFR, Estimated 48 (*)    All other components within normal limits  PROTIME-INR - Abnormal; Notable for the following components:   Prothrombin Time 27.5 (*)    INR 2.6 (*)    All other components within normal limits  CBC WITH DIFFERENTIAL/PLATELET    EKG EKG Interpretation  Date/Time:  Friday November 23 2021 20:15:07 EST Ventricular Rate:  62 PR Interval:  160 QRS Duration: 154 QT Interval:  467 QTC Calculation: 475 R Axis:   85 Text Interpretation: Sinus rhythm s Confirmed by Virgina Norfolk ([id number]) on 11/23/2021 8:41:56 PM  Radiology CT Head Wo Contrast  Result Date: 11/23/2021 CLINICAL DATA:  Mental status change, unknown cause; Neck trauma (Age >= 65y). Fall, dementia, altered mental status  EXAM: CT HEAD WITHOUT CONTRAST CT CERVICAL SPINE WITHOUT CONTRAST TECHNIQUE: Multidetector CT imaging of the head and cervical spine was performed following the standard protocol without intravenous contrast. Multiplanar CT image reconstructions of the cervical spine were also generated. RADIATION DOSE REDUCTION: This exam was performed according to the departmental dose-optimization  program which includes automated exposure control, adjustment of the mA and/or kV according to patient size and/or use of iterative reconstruction technique. COMPARISON:  CT head 09/09/2020 FINDINGS: CT HEAD FINDINGS Brain: Normal anatomic configuration. Parenchymal volume loss is commensurate with the patient's age. Mild periventricular white matter changes are present likely reflecting the sequela of small vessel ischemia. Remote infarcts within the cerebellar hemispheres bilaterally, right greater than left, right insular cortex/anterior limb of the right internal capsule and right frontal periventricular white matter are again identified. No abnormal intra or extra-axial mass lesion or fluid collection. No abnormal mass effect or midline shift. No evidence of acute intracranial hemorrhage or infarct. Ventricular size is normal. Cerebellum unremarkable. Vascular: No asymmetric hyperdense vasculature at the skull base. Skull: Intact Sinuses/Orbits: Paranasal sinuses are clear. Orbits are unremarkable. Other: Mastoid air cells and middle ear cavities are clear. CT CERVICAL SPINE FINDINGS Alignment: Normal. Skull base and vertebrae: Craniocervical alignment is normal. The atlantodental interval is not widened. No acute fracture of the cervical spine. Vertebral body height is preserved. Soft tissues and spinal canal: No prevertebral fluid or swelling. No visible canal hematoma. Disc levels: Endplate remodeling and disc calcifications throughout the cervical spine are in keeping with changes of mild diffuse degenerative disc disease.  Prevertebral soft tissues are not thickened on sagittal reformats. Spinal canal is widely patent. No significant neuroforaminal narrowing. Upper chest: Negative. Other: None IMPRESSION: 1. No acute intracranial abnormality. No calvarial fracture. 2. No acute fracture or listhesis of the cervical spine. Electronically Signed   By: Helyn Numbers M.D.   On: 11/23/2021 21:11   CT Cervical Spine Wo Contrast  Result Date: 11/23/2021 CLINICAL DATA:  Mental status change, unknown cause; Neck trauma (Age >= 65y). Fall, dementia, altered mental status EXAM: CT HEAD WITHOUT CONTRAST CT CERVICAL SPINE WITHOUT CONTRAST TECHNIQUE: Multidetector CT imaging of the head and cervical spine was performed following the standard protocol without intravenous contrast. Multiplanar CT image reconstructions of the cervical spine were also generated. RADIATION DOSE REDUCTION: This exam was performed according to the departmental dose-optimization program which includes automated exposure control, adjustment of the mA and/or kV according to patient size and/or use of iterative reconstruction technique. COMPARISON:  CT head 09/09/2020 FINDINGS: CT HEAD FINDINGS Brain: Normal anatomic configuration. Parenchymal volume loss is commensurate with the patient's age. Mild periventricular white matter changes are present likely reflecting the sequela of small vessel ischemia. Remote infarcts within the cerebellar hemispheres bilaterally, right greater than left, right insular cortex/anterior limb of the right internal capsule and right frontal periventricular white matter are again identified. No abnormal intra or extra-axial mass lesion or fluid collection. No abnormal mass effect or midline shift. No evidence of acute intracranial hemorrhage or infarct. Ventricular size is normal. Cerebellum unremarkable. Vascular: No asymmetric hyperdense vasculature at the skull base. Skull: Intact Sinuses/Orbits: Paranasal sinuses are clear. Orbits are  unremarkable. Other: Mastoid air cells and middle ear cavities are clear. CT CERVICAL SPINE FINDINGS Alignment: Normal. Skull base and vertebrae: Craniocervical alignment is normal. The atlantodental interval is not widened. No acute fracture of the cervical spine. Vertebral body height is preserved. Soft tissues and spinal canal: No prevertebral fluid or swelling. No visible canal hematoma. Disc levels: Endplate remodeling and disc calcifications throughout the cervical spine are in keeping with changes of mild diffuse degenerative disc disease. Prevertebral soft tissues are not thickened on sagittal reformats. Spinal canal is widely patent. No significant neuroforaminal narrowing. Upper chest: Negative. Other: None IMPRESSION: 1. No acute intracranial abnormality.  No calvarial fracture. 2. No acute fracture or listhesis of the cervical spine. Electronically Signed   By: Helyn Numbers M.D.   On: 11/23/2021 21:11   DG Chest 2 View  Result Date: 11/23/2021 CLINICAL DATA:  Fall EXAM: CHEST - 2 VIEW COMPARISON:  Chest x-ray dated March 27, 2016; chest x-ray dated December 14, 2014 FINDINGS: Cardiac and mediastinal contours are unchanged post median sternotomy and mitral valve replacement. Course lower lung interstitial opacities which are not significantly changed when compared with the prior exams. No evidence of acute airspace opacities no evidence of pleural effusion. No pneumothorax. IMPRESSION: Coarse lower lung interstitial opacities which are similar to prior exams and likely due to underlying fibrotic interstitial lung disease. No evidence of acute airspace opacity. Electronically Signed   By: Allegra Lai M.D.   On: 11/23/2021 21:04   DG Pelvis 1-2 Views  Result Date: 11/23/2021 CLINICAL DATA:  Trauma, fall EXAM: PELVIS - 1-2 VIEW COMPARISON:  None Available. FINDINGS: No displaced fractures are seen. There is no dislocation. Small bony spurs are seen in both hips. Arterial calcifications are  seen in soft tissues. IMPRESSION: No fracture or dislocation is seen in pelvis.  Arteriosclerosis. Electronically Signed   By: Ernie Avena M.D.   On: 11/23/2021 21:00   DG Knee Complete 4 Views Right  Result Date: 11/23/2021 CLINICAL DATA:  Trauma, fall EXAM: RIGHT KNEE - COMPLETE 4+ VIEW COMPARISON:  None Available. FINDINGS: No fracture or dislocation is seen. Chondrocalcinosis is seen in right knee. There is no significant effusion in suprapatellar bursa. Fairly extensive arterial calcifications are seen in soft tissues. IMPRESSION: No fracture or dislocation is seen. Chondrocalcinosis suggesting degenerative arthritis. Arteriosclerosis. Electronically Signed   By: Ernie Avena M.D.   On: 11/23/2021 20:59    Procedures Procedures    Medications Ordered in ED Medications - No data to display  ED Course/ Medical Decision Making/ A&P                           Medical Decision Making Amount and/or Complexity of Data Reviewed Labs: ordered. Radiology: ordered.   Shaneil E Purnell Rodriguez is here after possible unwitnessed fall.  History of dementia.  Acting at her baseline.  Was found on the floor when daughter got home from work.  Patient lives with her daughter at home but she spends time alone when patient daughter is at work.  She is on Coumadin for heart valve replacement.  There is no signs of trauma on exam.  Patient seems to be at her baseline.  She is ambulatory when daughter got home but given that she is on a blood thinner concern for possible injury.  Seems that dementia has been slightly progressive.  More confusion.  But there is no home help and patient is often left home alone for prolonged periods of time.  Will have social work try to arrange for home health assessment.  She overall appears well.  Neurologically she is intact.  She has no complaints.  Vital signs are unremarkable.  We will get a CBC, BMP, head CT, neck CT, chest x-ray and pelvic x-ray right knee  x-ray to evaluate for any traumatic processes or electrolyte abnormality.  I have talked with family and anticipate discharge back to home if work-up is unremarkable.  EKG per my review and interpretation shows sinus rhythm.  No ischemic changes.  Per my review and interpretation of labs is no significant anemia, electrolyte  abnormality, kidney injury or leukocytosis.  Head and neck CT per radiology report with no acute findings.  Per my review and interpretation of chest x-ray and pelvic x-ray there are no acute findings.  Overall she is at her baseline.  There is no traumatic findings on imaging.  Vital signs are reassuring.  Blood work is reassuring.  Home health has been arranged to come out to the house hopefully next week.  Patient discharged in good condition.  Understands return precautions.  This chart was dictated using voice recognition software.  Despite best efforts to proofread,  errors can occur which can change the documentation meaning.         Final Clinical Impression(s) / ED Diagnoses Final diagnoses:  Fall, initial encounter    Rx / DC Orders ED Discharge Orders     None         Virgina Norfolk, DO 11/23/21 2232

## 2021-11-23 NOTE — ED Triage Notes (Addendum)
Pt BIB EMS from home, pt currently acting at baseline hx of dementia disoriented x4 on scene now alert to self. Daughter came home and found pt on floor. Pt was last seen at 0730 this morning daughter returned home at 2. Unknown when pt fell. R knee swelling. On warfarin denies pain at this time. No visible trauma to head or neck. Pt able to stand/ambulate on scene.   134/86 HR 62 99% RA CBG 128

## 2021-11-23 NOTE — Care Management (Signed)
Discussed with Dr Lockie Mola regarding patient is at home in apartment while daughter works. Patient advancing in dementia, will likely need PCS services( to be done by PCP) currently placing Home Health orders for safety survaillance, reconditioning (PT OT) aide services and CSW.  Messaged Delila Spence  for acceptance.

## 2021-11-26 ENCOUNTER — Telehealth: Payer: Self-pay | Admitting: Internal Medicine

## 2021-11-26 NOTE — Telephone Encounter (Signed)
Fine for verbals °

## 2021-11-26 NOTE — Telephone Encounter (Signed)
LVM with verbal Ok for Frequency of Visits: 1 week 3 for teaching with daughter.

## 2021-11-26 NOTE — Telephone Encounter (Signed)
HH ORDERS   Caller Name: [facility] Agency Name: [facility] Callback Phone #: [phone] secure line  Service Requested: Nursing (examples: OT/PT/Skilled Nursing/Social Work/Speech Therapy/Wound Care)  Frequency of Visits: 1 week 3 for teaching with daughter.    Patients daughter wont be home for all the other orders put in by provider because she works and wont be home for all of it. She said the only things she will agree to at the moment would be nursing and a Child psychotherapist.

## 2021-11-27 NOTE — Telephone Encounter (Signed)
Patient takes furosemide prn - should potassium be changed to prn?  Hydralazine - list at home says 3 times a day - but daughter said doctor said to take 2 times a day?  Which is it?

## 2021-11-27 NOTE — Telephone Encounter (Signed)
Spoke with pts daughter to inform her of the medication clarification and the pts daughter has stated that she did ot call that the nurse from [facility] was the one who called for this clarification. I was able t let the pts daughter know the instructions a/w lvm for Tonya with the instructions as well.

## 2021-11-27 NOTE — Telephone Encounter (Signed)
Okay to stop potassium. Lasix is as needed for swelling. Hydralazine is okay for 1 pill BID.

## 2021-12-03 ENCOUNTER — Other Ambulatory Visit (HOSPITAL_COMMUNITY): Payer: Self-pay

## 2021-12-03 ENCOUNTER — Other Ambulatory Visit: Payer: Self-pay | Admitting: Internal Medicine

## 2021-12-03 MED ORDER — HYDRALAZINE HCL 50 MG PO TABS
75.0000 mg | ORAL_TABLET | Freq: Three times a day (TID) | ORAL | 3 refills | Status: DC
  Filled 2021-12-03: qty 135, 30d supply, fill #0
  Filled 2022-01-12 (×2): qty 135, 30d supply, fill #1
  Filled 2022-03-15 – 2022-03-16 (×2): qty 135, 30d supply, fill #2
  Filled 2022-05-04: qty 135, 30d supply, fill #3

## 2021-12-07 ENCOUNTER — Ambulatory Visit (INDEPENDENT_AMBULATORY_CARE_PROVIDER_SITE_OTHER): Payer: Medicare HMO | Admitting: Cardiovascular Disease

## 2021-12-07 DIAGNOSIS — Z5181 Encounter for therapeutic drug level monitoring: Secondary | ICD-10-CM

## 2021-12-07 LAB — POCT INR: INR: 3.7 — AB (ref 2.0–3.0)

## 2021-12-14 ENCOUNTER — Ambulatory Visit (INDEPENDENT_AMBULATORY_CARE_PROVIDER_SITE_OTHER): Payer: Medicare HMO | Admitting: Internal Medicine

## 2021-12-14 DIAGNOSIS — Z5181 Encounter for therapeutic drug level monitoring: Secondary | ICD-10-CM | POA: Diagnosis not present

## 2021-12-14 LAB — POCT INR: INR: 4.2 — AB (ref 2.0–3.0)

## 2021-12-16 ENCOUNTER — Other Ambulatory Visit: Payer: Self-pay | Admitting: Internal Medicine

## 2021-12-17 ENCOUNTER — Other Ambulatory Visit (HOSPITAL_COMMUNITY): Payer: Self-pay

## 2021-12-19 ENCOUNTER — Other Ambulatory Visit (HOSPITAL_COMMUNITY): Payer: Self-pay

## 2021-12-19 MED ORDER — ISOSORBIDE MONONITRATE ER 30 MG PO TB24
30.0000 mg | ORAL_TABLET | Freq: Every day | ORAL | 2 refills | Status: DC
  Filled 2021-12-19: qty 90, 90d supply, fill #0
  Filled 2022-03-23: qty 90, 90d supply, fill #1
  Filled 2022-07-01: qty 90, 90d supply, fill #2

## 2021-12-19 MED ORDER — PANTOPRAZOLE SODIUM 40 MG PO TBEC
40.0000 mg | DELAYED_RELEASE_TABLET | Freq: Every day | ORAL | 2 refills | Status: DC
  Filled 2021-12-19: qty 90, 90d supply, fill #0
  Filled 2022-03-30: qty 90, 90d supply, fill #1
  Filled 2022-04-19 – 2022-07-01 (×2): qty 90, 90d supply, fill #2

## 2021-12-21 ENCOUNTER — Ambulatory Visit (INDEPENDENT_AMBULATORY_CARE_PROVIDER_SITE_OTHER): Payer: Medicare HMO | Admitting: Cardiovascular Disease

## 2021-12-21 DIAGNOSIS — Z5181 Encounter for therapeutic drug level monitoring: Secondary | ICD-10-CM

## 2021-12-21 LAB — POCT INR: INR: 1.7 — AB (ref 2.0–3.0)

## 2021-12-22 ENCOUNTER — Other Ambulatory Visit (HOSPITAL_COMMUNITY): Payer: Self-pay

## 2021-12-24 ENCOUNTER — Other Ambulatory Visit (HOSPITAL_COMMUNITY): Payer: Self-pay

## 2021-12-24 ENCOUNTER — Telehealth: Payer: Self-pay | Admitting: Internal Medicine

## 2021-12-24 NOTE — Telephone Encounter (Signed)
Patients daughter [name] is requesting a call back from nurse to go over some stuff that happened over the weekend. Callback is [phone] or [phone]

## 2021-12-25 NOTE — Telephone Encounter (Signed)
I was able to speak with the pts daughter and she has stated on Sunday 12/23/2021 she tried to wake her mom up in the morning for breakfast and the pt was not awaking or responding this episode lasted 15-20 min... she stated pt was breathing with no complication. [name] then called EMS due to concerns of pt not responding to her trying to wake her. EMS checked pt and stated pt vitals were good and pt did know who herself, her daughter was. Pt went about her day as usual with no abnormalities.  Pt daughter stated that pt had went back to sleep later on in he day before dinner so when she went to wake pt for dinner pt had another episode where she was responding or waking up for 15-79min every with [name] shaking pt, calling pts name out and saying mom pt remained in what her states was a very deep sleep then woke as if nothing happened with no confusion, no slurred speech.  **Pt daughter is wanting to know if this is a side effect/stage of her mom's dementia? Pts daughter also asked that a referral be sent in for pt to been at [facility] to help with keeping up with pts dementia and also help with educating her on the stages and what to expect or look out for with each stage. Pt was seeing Dr. Karel Jarvis at [facility] who was keeping an eye on pts progress but provider has retired.

## 2021-12-26 NOTE — Telephone Encounter (Signed)
Lvm for pts daughter to call the clinic back to sched an apptmnt with any provider to get an EKG done per Dr. Frutoso Chase request.

## 2021-12-26 NOTE — Telephone Encounter (Signed)
I would recommend visit for EKG at office with any provider to assess for cardiac cause.

## 2021-12-28 ENCOUNTER — Ambulatory Visit (INDEPENDENT_AMBULATORY_CARE_PROVIDER_SITE_OTHER): Payer: Medicare HMO | Admitting: Cardiology

## 2021-12-28 ENCOUNTER — Telehealth: Payer: Self-pay

## 2021-12-28 DIAGNOSIS — Z5181 Encounter for therapeutic drug level monitoring: Secondary | ICD-10-CM | POA: Diagnosis not present

## 2021-12-28 LAB — POCT INR: INR: 4.4 — AB (ref 2.0–3.0)

## 2021-12-28 NOTE — Telephone Encounter (Signed)
Reminded pt's daughter to check INR.  Verbalized understanding 

## 2022-01-04 ENCOUNTER — Telehealth: Payer: Self-pay

## 2022-01-04 NOTE — Telephone Encounter (Signed)
Lp's daughter message to check INR 

## 2022-01-08 ENCOUNTER — Ambulatory Visit (INDEPENDENT_AMBULATORY_CARE_PROVIDER_SITE_OTHER): Payer: Medicare HMO | Admitting: Internal Medicine

## 2022-01-08 DIAGNOSIS — Z5181 Encounter for therapeutic drug level monitoring: Secondary | ICD-10-CM | POA: Diagnosis not present

## 2022-01-08 LAB — POCT INR: INR: 1.8 — AB (ref 2.0–3.0)

## 2022-01-12 ENCOUNTER — Other Ambulatory Visit: Payer: Self-pay | Admitting: Internal Medicine

## 2022-01-12 ENCOUNTER — Other Ambulatory Visit (HOSPITAL_COMMUNITY): Payer: Self-pay

## 2022-01-14 ENCOUNTER — Other Ambulatory Visit (HOSPITAL_COMMUNITY): Payer: Self-pay

## 2022-01-14 MED ORDER — PREDNISONE 20 MG PO TABS
40.0000 mg | ORAL_TABLET | Freq: Every day | ORAL | 4 refills | Status: DC
  Filled 2022-01-14: qty 10, 5d supply, fill #0
  Filled 2022-02-15: qty 10, 5d supply, fill #1
  Filled 2022-03-15: qty 10, 5d supply, fill #2
  Filled 2022-03-23: qty 10, 5d supply, fill #3
  Filled 2022-05-09 – 2022-09-18 (×2): qty 10, 5d supply, fill #4

## 2022-01-14 MED ORDER — ALLOPURINOL 300 MG PO TABS
300.0000 mg | ORAL_TABLET | Freq: Every day | ORAL | 3 refills | Status: DC
  Filled 2022-01-14: qty 90, 90d supply, fill #0
  Filled 2022-04-27: qty 90, 90d supply, fill #1
  Filled 2022-09-29: qty 90, 90d supply, fill #2

## 2022-01-14 MED ORDER — DONEPEZIL HCL 10 MG PO TABS
10.0000 mg | ORAL_TABLET | Freq: Every day | ORAL | 3 refills | Status: DC
  Filled 2022-01-14: qty 90, 90d supply, fill #0
  Filled 2022-04-27: qty 90, 90d supply, fill #1
  Filled 2022-10-25 – 2022-10-26 (×2): qty 90, 90d supply, fill #2

## 2022-01-14 MED ORDER — ATORVASTATIN CALCIUM 40 MG PO TABS
40.0000 mg | ORAL_TABLET | Freq: Every day | ORAL | 0 refills | Status: DC
  Filled 2022-01-14: qty 90, 90d supply, fill #0

## 2022-01-22 ENCOUNTER — Telehealth: Payer: Self-pay

## 2022-01-22 NOTE — Telephone Encounter (Signed)
Spoke to patient's daughter and she will check INR later this week.

## 2022-01-24 ENCOUNTER — Telehealth: Payer: Self-pay | Admitting: *Deleted

## 2022-01-24 ENCOUNTER — Other Ambulatory Visit (HOSPITAL_COMMUNITY): Payer: Self-pay

## 2022-01-24 ENCOUNTER — Other Ambulatory Visit: Payer: Self-pay

## 2022-01-24 NOTE — Telephone Encounter (Signed)
Called pt/dtr phone line in reference to her INR beign overdue; there was no answer therefore left a message for them to call back. Will await.

## 2022-01-25 ENCOUNTER — Ambulatory Visit (INDEPENDENT_AMBULATORY_CARE_PROVIDER_SITE_OTHER): Payer: Medicare HMO | Admitting: Cardiology

## 2022-01-25 DIAGNOSIS — Z5181 Encounter for therapeutic drug level monitoring: Secondary | ICD-10-CM | POA: Diagnosis not present

## 2022-01-25 LAB — POCT INR: INR: 3.4 — AB (ref 2.0–3.0)

## 2022-01-25 NOTE — Patient Instructions (Addendum)
Description   Spoke with pt's daughter, Jonelle Sidle, and instructed to have pt hold today's dose of warfarin then continue taking 1 tablet daily and Recheck INR in 2 weeks-dtr aware we must speak to her or she must call back to confirm instructions. Gout flare up recently, which has tendency to increase INR so did not boost. SELF TESTER. Coumadin Clinic [phone]; Gout flare ups more recent, medication causing increase, advised to eat greens during flare ups.

## 2022-02-07 ENCOUNTER — Ambulatory Visit (INDEPENDENT_AMBULATORY_CARE_PROVIDER_SITE_OTHER): Payer: Medicare HMO

## 2022-02-07 DIAGNOSIS — Z5181 Encounter for therapeutic drug level monitoring: Secondary | ICD-10-CM

## 2022-02-07 LAB — POCT INR: INR: 3.4 — AB (ref 2.0–3.0)

## 2022-02-07 NOTE — Patient Instructions (Signed)
Description   Spoke with pt's daughter, Jonelle Sidle, and instructed for pt to only take 1/2 tablet today and then START taking 1 tablet daily EXCEPT 1/2 tablet on Sundays.  Recheck INR in 2 weeks-dtr aware we must speak to her or she must call back to confirm instructions.  Coumadin Clinic [phone];  Gout flare ups more recent, medication causing increase, advised to eat greens during flare ups.

## 2022-02-11 ENCOUNTER — Other Ambulatory Visit (HOSPITAL_COMMUNITY): Payer: Self-pay

## 2022-02-11 ENCOUNTER — Other Ambulatory Visit: Payer: Self-pay | Admitting: Internal Medicine

## 2022-02-11 MED ORDER — WARFARIN SODIUM 2 MG PO TABS
ORAL_TABLET | ORAL | 0 refills | Status: DC
  Filled 2022-02-11: qty 60, 40d supply, fill #0
  Filled 2022-02-11: qty 90, 60d supply, fill #0
  Filled 2022-03-28: qty 39, 26d supply, fill #1
  Filled 2022-03-28: qty 90, 60d supply, fill #1
  Filled 2022-03-28: qty 51, 34d supply, fill #1

## 2022-02-13 ENCOUNTER — Other Ambulatory Visit (HOSPITAL_COMMUNITY): Payer: Self-pay

## 2022-02-21 ENCOUNTER — Telehealth: Payer: Self-pay | Admitting: *Deleted

## 2022-02-21 NOTE — Telephone Encounter (Signed)
Left a message for pt's dtr to call regarding INR monitoring that is due today.

## 2022-02-22 ENCOUNTER — Ambulatory Visit (INDEPENDENT_AMBULATORY_CARE_PROVIDER_SITE_OTHER): Payer: Medicare HMO | Admitting: Internal Medicine

## 2022-02-22 DIAGNOSIS — Z952 Presence of prosthetic heart valve: Secondary | ICD-10-CM

## 2022-02-22 DIAGNOSIS — Z7901 Long term (current) use of anticoagulants: Secondary | ICD-10-CM | POA: Diagnosis not present

## 2022-02-22 DIAGNOSIS — Z5181 Encounter for therapeutic drug level monitoring: Secondary | ICD-10-CM

## 2022-02-22 LAB — POCT INR: INR: 2.3 (ref 2.0–3.0)

## 2022-02-22 NOTE — Patient Instructions (Signed)
Description   Spoke with pt's daughter, Jonelle Sidle, and instructed for pt to continue to take warfarin 1 tablet daily.  Recheck INR in 2 weeks-dtr aware we must speak to her or she must call back to confirm instructions.  Coumadin Clinic [phone];  Gout flare ups more recent, medication causing increase, advised to eat greens during flare ups.

## 2022-02-26 ENCOUNTER — Other Ambulatory Visit (HOSPITAL_COMMUNITY): Payer: Self-pay

## 2022-03-05 ENCOUNTER — Other Ambulatory Visit: Payer: Self-pay

## 2022-03-08 ENCOUNTER — Ambulatory Visit: Payer: Medicare HMO | Admitting: Internal Medicine

## 2022-03-08 ENCOUNTER — Ambulatory Visit (INDEPENDENT_AMBULATORY_CARE_PROVIDER_SITE_OTHER): Payer: Medicare HMO | Admitting: *Deleted

## 2022-03-08 DIAGNOSIS — I4891 Unspecified atrial fibrillation: Secondary | ICD-10-CM

## 2022-03-08 DIAGNOSIS — Z953 Presence of xenogenic heart valve: Secondary | ICD-10-CM

## 2022-03-08 DIAGNOSIS — Z7901 Long term (current) use of anticoagulants: Secondary | ICD-10-CM

## 2022-03-08 LAB — POCT INR: INR: 4.1 — AB (ref 2.0–3.0)

## 2022-03-08 NOTE — Patient Instructions (Signed)
Description   Spoke with pt's daughter, Jonelle Sidle, and instructed for pt to have pt hold today's dose of warfarin then continue taking warfarin 1 tablet daily.  Recheck INR in 1 week (normally 2 weeks)-dtr aware we must speak to her or she must call back to confirm instructions. Coumadin Clinic [phone];  Gout flare ups more recent, medication causing increase, advised to eat greens during flare ups.

## 2022-03-15 ENCOUNTER — Telehealth: Payer: Self-pay | Admitting: Internal Medicine

## 2022-03-15 ENCOUNTER — Other Ambulatory Visit (HOSPITAL_COMMUNITY): Payer: Self-pay

## 2022-03-15 ENCOUNTER — Ambulatory Visit (INDEPENDENT_AMBULATORY_CARE_PROVIDER_SITE_OTHER): Payer: Medicare HMO

## 2022-03-15 DIAGNOSIS — I4891 Unspecified atrial fibrillation: Secondary | ICD-10-CM | POA: Diagnosis not present

## 2022-03-15 LAB — PROTIME-INR
INR: 8.2 (ref 0.9–1.2)
Prothrombin Time: 74.1 s — ABNORMAL HIGH (ref 9.1–12.0)

## 2022-03-15 LAB — POCT INR: INR: 7.4 — AB (ref 2.0–3.0)

## 2022-03-15 NOTE — Telephone Encounter (Signed)
Calling with critical INR results for pt. Call transferred.

## 2022-03-15 NOTE — Telephone Encounter (Signed)
Result noted, see anticoagulation note in Epic.  

## 2022-03-15 NOTE — Telephone Encounter (Signed)
Critical INR 8.2 reported. Will send to coumadin clinic.

## 2022-03-15 NOTE — Patient Instructions (Signed)
Description   Spoke with pt's daughter, Jonelle Sidle, advised POC machine is not accurate over 6, pt needs to come in for STAT PT/INR in office.  Pt's daughter agrees to bring her to [employer] office for [place].  She denies any bleeding issues with pt.  Will order STAT INR and await results for further dosing.  Pt has not taken Warfarin today and will continue to hold until further instructions after lab results received.  INR 8.2 from lab, called spoke with pt's daughter will have pt hold x 3 days and recheck INR on Monday since she is a self-tester. Aware to go to ED with any bleeding problems.  Dtr aware we must speak to her or she must call back to confirm instructions. Coumadin Clinic [phone];  Gout flare ups more recent, medication causing increase, advised to eat greens during flare ups.

## 2022-03-16 ENCOUNTER — Other Ambulatory Visit (HOSPITAL_COMMUNITY): Payer: Self-pay

## 2022-03-18 ENCOUNTER — Telehealth: Payer: Self-pay | Admitting: Internal Medicine

## 2022-03-18 ENCOUNTER — Ambulatory Visit (INDEPENDENT_AMBULATORY_CARE_PROVIDER_SITE_OTHER): Payer: Medicare HMO | Admitting: Internal Medicine

## 2022-03-18 ENCOUNTER — Encounter: Payer: Self-pay | Admitting: Internal Medicine

## 2022-03-18 VITALS — BP 120/76 | HR 67 | Temp 99.0°F | Ht 64.0 in | Wt 131.0 lb

## 2022-03-18 DIAGNOSIS — M1A09X0 Idiopathic chronic gout, multiple sites, without tophus (tophi): Secondary | ICD-10-CM

## 2022-03-18 DIAGNOSIS — I5022 Chronic systolic (congestive) heart failure: Secondary | ICD-10-CM | POA: Diagnosis not present

## 2022-03-18 DIAGNOSIS — Z5181 Encounter for therapeutic drug level monitoring: Secondary | ICD-10-CM

## 2022-03-18 DIAGNOSIS — I4891 Unspecified atrial fibrillation: Secondary | ICD-10-CM

## 2022-03-18 DIAGNOSIS — N183 Chronic kidney disease, stage 3 unspecified: Secondary | ICD-10-CM

## 2022-03-18 LAB — CBC
HCT: 37.2 % (ref 36.0–46.0)
Hemoglobin: 11.7 g/dL — ABNORMAL LOW (ref 12.0–15.0)
MCHC: 31.3 g/dL (ref 30.0–36.0)
MCV: 82.6 fl (ref 78.0–100.0)
Platelets: 357 10*3/uL (ref 150.0–400.0)
RBC: 4.5 Mil/uL (ref 3.87–5.11)
RDW: 16.3 % — ABNORMAL HIGH (ref 11.5–15.5)
WBC: 6 10*3/uL (ref 4.0–10.5)

## 2022-03-18 LAB — URIC ACID: Uric Acid, Serum: 2.6 mg/dL (ref 2.4–7.0)

## 2022-03-18 LAB — COMPREHENSIVE METABOLIC PANEL
ALT: 17 U/L (ref 0–35)
AST: 19 U/L (ref 0–37)
Albumin: 3 g/dL — ABNORMAL LOW (ref 3.5–5.2)
Alkaline Phosphatase: 75 U/L (ref 39–117)
BUN: 17 mg/dL (ref 6–23)
CO2: 35 mEq/L — ABNORMAL HIGH (ref 19–32)
Calcium: 9.1 mg/dL (ref 8.4–10.5)
Chloride: 98 mEq/L (ref 96–112)
Creatinine, Ser: 1.38 mg/dL — ABNORMAL HIGH (ref 0.40–1.20)
GFR: 37.7 mL/min — ABNORMAL LOW (ref >60.00)
Glucose, Bld: 107 mg/dL — ABNORMAL HIGH (ref 70–99)
Potassium: 2.7 mEq/L — CL (ref 3.5–5.1)
Sodium: 141 mEq/L (ref 135–145)
Total Bilirubin: 1.4 mg/dL — ABNORMAL HIGH (ref 0.2–1.2)
Total Protein: 6.3 g/dL (ref 6.0–8.3)

## 2022-03-18 LAB — BRAIN NATRIURETIC PEPTIDE: Pro B Natriuretic peptide (BNP): 618 pg/mL — ABNORMAL HIGH (ref 0.0–100.0)

## 2022-03-18 LAB — POCT INR: INR: 1.8 — AB (ref 2.0–3.0)

## 2022-03-18 NOTE — Assessment & Plan Note (Signed)
Checking uric acid today and adjust allopurinol 400 mg daily dosing as needed for goal <6.

## 2022-03-18 NOTE — Assessment & Plan Note (Signed)
With new edema legs. Ordered CBC CMP, BNP. Taking lasix 40 mg daily when daughter able to give this to her. Not daily but at least 1-2 times per week. If labs normal will order repeat echo as none since 2020.

## 2022-03-18 NOTE — Assessment & Plan Note (Signed)
CMP ordered today to check stability.

## 2022-03-18 NOTE — Patient Instructions (Signed)
Description   Called and spoke to pt's daughter and instructed for pt to take 1.5 tablets of warfarin today and then START taking warfarin 1 tablet daily except for 1/2 a tablet on Sundays. Recheck INR in 1 week. Coumadin Clinic [phone];

## 2022-03-18 NOTE — Progress Notes (Signed)
   Subjective:   Patient ID: [name], female    DOB: 1945/10/06, 77 y.o.   MRN: [id number]  HPI The patient is a 77 YO female coming in for new leg swelling. Started 1-2 months ago.   Review of Systems  Unable to perform ROS: Dementia  Constitutional: Negative.   HENT: Negative.    Eyes: Negative.   Respiratory:  Negative for cough, chest tightness and shortness of breath.   Cardiovascular:  Positive for leg swelling. Negative for chest pain and palpitations.  Gastrointestinal:  Negative for abdominal distention, abdominal pain, constipation, diarrhea, nausea and vomiting.  Musculoskeletal: Negative.   Skin: Negative.   Neurological: Negative.     Objective:  Physical Exam Constitutional:      Appearance: She is well-developed.  HENT:     Head: Normocephalic and atraumatic.  Cardiovascular:     Rate and Rhythm: Normal rate and regular rhythm.  Pulmonary:     Effort: Pulmonary effort is normal. No respiratory distress.     Breath sounds: Normal breath sounds. No wheezing or rales.  Abdominal:     General: Bowel sounds are normal. There is no distension.     Palpations: Abdomen is soft.     Tenderness: There is no abdominal tenderness. There is no rebound.  Musculoskeletal:     Cervical back: Normal range of motion.     Right lower leg: Edema present.     Left lower leg: Edema present.     Comments: 2+ pitting edema to knees bilaterally  Skin:    General: Skin is warm and dry.  Neurological:     Mental Status: She is alert and oriented to person, place, and time.     Coordination: Coordination normal.     Vitals:   03/18/22 1308  BP: 120/76  Pulse: 67  Temp: 99 F (37.2 C)  TempSrc: Oral  SpO2: 100%  Weight: 131 lb (59.4 kg)  Height: 5\' 4"  (1.626 m)    Assessment & Plan:

## 2022-03-18 NOTE — Telephone Encounter (Signed)
On call coverage note:  Called and spoke to daughter about potassium of 2.7 It had been stopped 1-3 months ago by home health in attempt to reduce medications  She has had persistent swelling that hasn't responded well to lasix recently Daughter/caretaker understands need to resume potassium for which she has script and follow up within next week with Hoyt Koch, MD for recheck potassium/leg swellings Risks of untreated hypokalemia explained Encouraged patient to go to ER if symptomatic

## 2022-03-20 ENCOUNTER — Other Ambulatory Visit (HOSPITAL_COMMUNITY): Payer: Self-pay

## 2022-03-21 ENCOUNTER — Other Ambulatory Visit (HOSPITAL_COMMUNITY): Payer: Self-pay

## 2022-03-22 ENCOUNTER — Telehealth: Payer: Self-pay | Admitting: *Deleted

## 2022-03-22 ENCOUNTER — Ambulatory Visit (INDEPENDENT_AMBULATORY_CARE_PROVIDER_SITE_OTHER): Payer: Medicare HMO | Admitting: Cardiovascular Disease

## 2022-03-22 DIAGNOSIS — Z953 Presence of xenogenic heart valve: Secondary | ICD-10-CM

## 2022-03-22 DIAGNOSIS — Z5181 Encounter for therapeutic drug level monitoring: Secondary | ICD-10-CM

## 2022-03-22 LAB — POCT INR: INR: 2.7 (ref 2.0–3.0)

## 2022-03-22 NOTE — Patient Instructions (Signed)
Description   Called and spoke to pt's daughter and instructed for pt to continue taking warfarin 1 tablet daily except for 1/2 a tablet on Sundays. Recheck INR in 1 week. [facility] [phone];

## 2022-03-22 NOTE — Telephone Encounter (Signed)
Called pt's dtr since INR is overdue; there was no answer therefore, called pt and left a message to perform INR monitoring. Will await a call back.

## 2022-03-25 ENCOUNTER — Other Ambulatory Visit: Payer: Self-pay | Admitting: Internal Medicine

## 2022-03-25 MED ORDER — MEDICAL COMPRESSION STOCKINGS MISC: 3 refills | Status: DC

## 2022-03-28 ENCOUNTER — Other Ambulatory Visit: Payer: Self-pay

## 2022-03-28 ENCOUNTER — Other Ambulatory Visit (HOSPITAL_COMMUNITY): Payer: Self-pay

## 2022-03-30 ENCOUNTER — Other Ambulatory Visit (HOSPITAL_COMMUNITY): Payer: Self-pay

## 2022-04-01 DIAGNOSIS — Z952 Presence of prosthetic heart valve: Secondary | ICD-10-CM | POA: Diagnosis not present

## 2022-04-01 DIAGNOSIS — Z7901 Long term (current) use of anticoagulants: Secondary | ICD-10-CM | POA: Diagnosis not present

## 2022-04-01 LAB — POCT INR: INR: 3.3 — AB (ref 2.0–3.0)

## 2022-04-02 ENCOUNTER — Ambulatory Visit (INDEPENDENT_AMBULATORY_CARE_PROVIDER_SITE_OTHER): Payer: Medicare HMO

## 2022-04-02 DIAGNOSIS — Z5181 Encounter for therapeutic drug level monitoring: Secondary | ICD-10-CM | POA: Diagnosis not present

## 2022-04-02 DIAGNOSIS — Z953 Presence of xenogenic heart valve: Secondary | ICD-10-CM

## 2022-04-02 DIAGNOSIS — I4891 Unspecified atrial fibrillation: Secondary | ICD-10-CM

## 2022-04-02 DIAGNOSIS — Z7901 Long term (current) use of anticoagulants: Secondary | ICD-10-CM

## 2022-04-02 NOTE — Patient Instructions (Addendum)
    Description   Called and spoke to pt's daughter and instructed for pt to skip tomorrow's dosage of Warfarin, since pt has already taken today's dosage, then resume same dosage of Warfarin 1 tablet daily except for 1/2 a tablet on Sundays. Recheck INR in 1 week. [facility] [phone];

## 2022-04-09 ENCOUNTER — Telehealth: Payer: Self-pay | Admitting: *Deleted

## 2022-04-09 NOTE — Telephone Encounter (Signed)
Called pt in reference to overdue INR monitoring; there was no answer so left a message for patient/daughter [name] to call back regarding this.

## 2022-04-11 ENCOUNTER — Ambulatory Visit (INDEPENDENT_AMBULATORY_CARE_PROVIDER_SITE_OTHER): Payer: Medicare HMO

## 2022-04-11 DIAGNOSIS — Z5181 Encounter for therapeutic drug level monitoring: Secondary | ICD-10-CM

## 2022-04-11 LAB — POCT INR: INR: 5.1 — AB (ref 2.0–3.0)

## 2022-04-11 NOTE — Patient Instructions (Signed)
Description   Called and spoke to pt's daughter and instructed for pt to HOLD Warfarin today and tomorrow and then START taking Warfarin 1 tablet daily except for 1/2 a tablet on Sundays and Tuesdays.   Recheck INR in 1 week.  [facility] [phone]

## 2022-04-18 ENCOUNTER — Ambulatory Visit (INDEPENDENT_AMBULATORY_CARE_PROVIDER_SITE_OTHER): Payer: Medicare HMO | Admitting: Cardiology

## 2022-04-18 DIAGNOSIS — Z5181 Encounter for therapeutic drug level monitoring: Secondary | ICD-10-CM | POA: Diagnosis not present

## 2022-04-18 LAB — POCT INR: INR: 2.7 (ref 2.0–3.0)

## 2022-04-18 NOTE — Patient Instructions (Signed)
Description   Called and spoke to pt's daughter and instructed for pt to continue taking Warfarin 1 tablet daily except for 1/2 a tablet on Sundays and Tuesdays.   Recheck INR in 1 week.  [facility] [phone]

## 2022-04-19 ENCOUNTER — Other Ambulatory Visit: Payer: Self-pay | Admitting: Internal Medicine

## 2022-04-20 ENCOUNTER — Other Ambulatory Visit (HOSPITAL_COMMUNITY): Payer: Self-pay

## 2022-04-22 ENCOUNTER — Other Ambulatory Visit (HOSPITAL_COMMUNITY): Payer: Self-pay

## 2022-04-22 MED ORDER — ATORVASTATIN CALCIUM 40 MG PO TABS
40.0000 mg | ORAL_TABLET | Freq: Every day | ORAL | 0 refills | Status: DC
  Filled 2022-04-22: qty 90, 90d supply, fill #0

## 2022-04-25 ENCOUNTER — Telehealth: Payer: Self-pay | Admitting: *Deleted

## 2022-04-25 NOTE — Telephone Encounter (Signed)
Called pt since INR is due, there was no answer therefore, left a message to perform INR and call back.

## 2022-04-26 ENCOUNTER — Ambulatory Visit (INDEPENDENT_AMBULATORY_CARE_PROVIDER_SITE_OTHER): Payer: Medicare HMO | Admitting: Cardiology

## 2022-04-26 DIAGNOSIS — I4891 Unspecified atrial fibrillation: Secondary | ICD-10-CM

## 2022-04-26 DIAGNOSIS — Z953 Presence of xenogenic heart valve: Secondary | ICD-10-CM

## 2022-04-26 DIAGNOSIS — Z5181 Encounter for therapeutic drug level monitoring: Secondary | ICD-10-CM | POA: Diagnosis not present

## 2022-04-26 DIAGNOSIS — Z7901 Long term (current) use of anticoagulants: Secondary | ICD-10-CM

## 2022-04-26 LAB — POCT INR: INR: 3.6 — AB (ref 2.0–3.0)

## 2022-04-26 NOTE — Patient Instructions (Addendum)
   Description   Called and spoke to pt's daughter and instructed for pt to skip today's dosage of Warfarin, then start taking Warfarin 1 tablet daily except for 1/2 a tablet on Sundays, Tuesdays and Thursdays.   Recheck INR in 1 week.  [facility] [phone]

## 2022-04-27 ENCOUNTER — Other Ambulatory Visit (HOSPITAL_COMMUNITY): Payer: Self-pay

## 2022-05-03 ENCOUNTER — Ambulatory Visit (INDEPENDENT_AMBULATORY_CARE_PROVIDER_SITE_OTHER): Payer: Medicare HMO | Admitting: Internal Medicine

## 2022-05-03 DIAGNOSIS — Z7901 Long term (current) use of anticoagulants: Secondary | ICD-10-CM | POA: Diagnosis not present

## 2022-05-03 DIAGNOSIS — Z5181 Encounter for therapeutic drug level monitoring: Secondary | ICD-10-CM | POA: Diagnosis not present

## 2022-05-03 DIAGNOSIS — Z952 Presence of prosthetic heart valve: Secondary | ICD-10-CM | POA: Diagnosis not present

## 2022-05-03 DIAGNOSIS — I4891 Unspecified atrial fibrillation: Secondary | ICD-10-CM

## 2022-05-03 LAB — POCT INR: INR: 2.9 (ref 2.0–3.0)

## 2022-05-03 NOTE — Patient Instructions (Signed)
Description   Called and spoke to pt's daughter and instructed for pt to continue taking Warfarin 1 tablet daily except for 1/2 a tablet on Sundays, Tuesdays and Thursdays.   Recheck INR in 1 week.  [facility] [phone]

## 2022-05-04 ENCOUNTER — Other Ambulatory Visit (HOSPITAL_COMMUNITY): Payer: Self-pay

## 2022-05-09 ENCOUNTER — Other Ambulatory Visit (HOSPITAL_COMMUNITY): Payer: Self-pay

## 2022-05-10 ENCOUNTER — Ambulatory Visit (INDEPENDENT_AMBULATORY_CARE_PROVIDER_SITE_OTHER): Payer: Medicare HMO | Admitting: Cardiovascular Disease

## 2022-05-10 DIAGNOSIS — I4891 Unspecified atrial fibrillation: Secondary | ICD-10-CM | POA: Diagnosis not present

## 2022-05-10 DIAGNOSIS — Z5181 Encounter for therapeutic drug level monitoring: Secondary | ICD-10-CM | POA: Diagnosis not present

## 2022-05-10 LAB — POCT INR: INR: 2.9 (ref 2.0–3.0)

## 2022-05-10 NOTE — Patient Instructions (Signed)
Description   Called and spoke to pt's daughter and instructed for pt to continue taking Warfarin 1 tablet daily except for 1/2 a tablet on Sundays, Tuesdays and Thursdays.   Recheck INR in 2 weeks.  [facility] [phone]

## 2022-05-17 ENCOUNTER — Other Ambulatory Visit (HOSPITAL_COMMUNITY): Payer: Self-pay

## 2022-05-24 ENCOUNTER — Ambulatory Visit (INDEPENDENT_AMBULATORY_CARE_PROVIDER_SITE_OTHER): Payer: Medicare HMO | Admitting: Cardiology

## 2022-05-24 DIAGNOSIS — Z7901 Long term (current) use of anticoagulants: Secondary | ICD-10-CM | POA: Diagnosis not present

## 2022-05-24 DIAGNOSIS — Z953 Presence of xenogenic heart valve: Secondary | ICD-10-CM

## 2022-05-24 LAB — POCT INR: INR: 2.6 (ref 2.0–3.0)

## 2022-06-07 ENCOUNTER — Ambulatory Visit (INDEPENDENT_AMBULATORY_CARE_PROVIDER_SITE_OTHER): Payer: Medicare HMO | Admitting: Cardiology

## 2022-06-07 DIAGNOSIS — Z7901 Long term (current) use of anticoagulants: Secondary | ICD-10-CM

## 2022-06-07 DIAGNOSIS — Z953 Presence of xenogenic heart valve: Secondary | ICD-10-CM | POA: Diagnosis not present

## 2022-06-07 LAB — POCT INR: INR: 3.3 — AB (ref 2.0–3.0)

## 2022-06-14 ENCOUNTER — Ambulatory Visit (INDEPENDENT_AMBULATORY_CARE_PROVIDER_SITE_OTHER): Payer: Medicare HMO | Admitting: Internal Medicine

## 2022-06-14 DIAGNOSIS — Z952 Presence of prosthetic heart valve: Secondary | ICD-10-CM | POA: Diagnosis not present

## 2022-06-14 DIAGNOSIS — Z953 Presence of xenogenic heart valve: Secondary | ICD-10-CM

## 2022-06-14 DIAGNOSIS — Z7901 Long term (current) use of anticoagulants: Secondary | ICD-10-CM

## 2022-06-14 LAB — POCT INR: INR: 2.4 (ref 2.0–3.0)

## 2022-06-15 ENCOUNTER — Other Ambulatory Visit (HOSPITAL_COMMUNITY): Payer: Self-pay

## 2022-06-28 ENCOUNTER — Ambulatory Visit (INDEPENDENT_AMBULATORY_CARE_PROVIDER_SITE_OTHER): Payer: Medicare HMO | Admitting: Cardiovascular Disease

## 2022-06-28 DIAGNOSIS — Z7901 Long term (current) use of anticoagulants: Secondary | ICD-10-CM | POA: Diagnosis not present

## 2022-06-28 DIAGNOSIS — Z953 Presence of xenogenic heart valve: Secondary | ICD-10-CM

## 2022-06-28 LAB — POCT INR: INR: 3.2 — AB (ref 2.0–3.0)

## 2022-07-01 ENCOUNTER — Other Ambulatory Visit: Payer: Self-pay | Admitting: Internal Medicine

## 2022-07-01 ENCOUNTER — Other Ambulatory Visit (HOSPITAL_COMMUNITY): Payer: Self-pay

## 2022-07-01 MED ORDER — HYDRALAZINE HCL 50 MG PO TABS
75.0000 mg | ORAL_TABLET | Freq: Three times a day (TID) | ORAL | 3 refills | Status: DC
  Filled 2022-07-01: qty 135, 30d supply, fill #0
  Filled 2022-08-27: qty 135, 30d supply, fill #1
  Filled 2022-10-25 – 2022-10-26 (×2): qty 135, 30d supply, fill #2
  Filled 2022-12-23: qty 135, 30d supply, fill #3

## 2022-07-03 ENCOUNTER — Other Ambulatory Visit (HOSPITAL_COMMUNITY): Payer: Self-pay

## 2022-07-04 ENCOUNTER — Other Ambulatory Visit (HOSPITAL_COMMUNITY): Payer: Self-pay

## 2022-07-05 ENCOUNTER — Ambulatory Visit (INDEPENDENT_AMBULATORY_CARE_PROVIDER_SITE_OTHER): Payer: Medicare HMO | Admitting: Cardiovascular Disease

## 2022-07-05 DIAGNOSIS — Z7901 Long term (current) use of anticoagulants: Secondary | ICD-10-CM | POA: Diagnosis not present

## 2022-07-05 DIAGNOSIS — Z953 Presence of xenogenic heart valve: Secondary | ICD-10-CM

## 2022-07-05 LAB — POCT INR: INR: 2.8 (ref 2.0–3.0)

## 2022-07-06 ENCOUNTER — Other Ambulatory Visit (HOSPITAL_COMMUNITY): Payer: Self-pay

## 2022-07-16 ENCOUNTER — Ambulatory Visit: Payer: Medicare HMO | Admitting: Family Medicine

## 2022-07-19 ENCOUNTER — Telehealth: Payer: Self-pay

## 2022-07-19 NOTE — Telephone Encounter (Signed)
Lp's daughter message to check INR today.

## 2022-07-19 NOTE — Telephone Encounter (Signed)
Lp's daughter a message to check INR.

## 2022-07-22 ENCOUNTER — Telehealth: Payer: Self-pay

## 2022-07-22 NOTE — Telephone Encounter (Signed)
Called and spoke with pt's daughter, [name] concerning overdue INR. [name] stated d/t her schedule she would not be able to check her mom's INR until this Friday. Made her aware that the INR must be checked by Friday since it is overdue. Verbalized understanding.

## 2022-07-26 ENCOUNTER — Ambulatory Visit (INDEPENDENT_AMBULATORY_CARE_PROVIDER_SITE_OTHER): Payer: Medicare HMO | Admitting: Cardiology

## 2022-07-26 DIAGNOSIS — Z953 Presence of xenogenic heart valve: Secondary | ICD-10-CM

## 2022-07-26 DIAGNOSIS — Z7901 Long term (current) use of anticoagulants: Secondary | ICD-10-CM | POA: Diagnosis not present

## 2022-07-26 LAB — POCT INR: INR: 2.8 (ref 2.0–3.0)

## 2022-08-07 ENCOUNTER — Encounter (INDEPENDENT_AMBULATORY_CARE_PROVIDER_SITE_OTHER): Payer: Self-pay

## 2022-08-09 ENCOUNTER — Ambulatory Visit (INDEPENDENT_AMBULATORY_CARE_PROVIDER_SITE_OTHER): Payer: Medicare HMO | Admitting: Cardiovascular Disease

## 2022-08-09 DIAGNOSIS — Z952 Presence of prosthetic heart valve: Secondary | ICD-10-CM | POA: Diagnosis not present

## 2022-08-09 DIAGNOSIS — Z953 Presence of xenogenic heart valve: Secondary | ICD-10-CM

## 2022-08-09 DIAGNOSIS — Z7901 Long term (current) use of anticoagulants: Secondary | ICD-10-CM | POA: Diagnosis not present

## 2022-08-09 LAB — POCT INR: INR: 2.9 (ref 2.0–3.0)

## 2022-08-10 ENCOUNTER — Other Ambulatory Visit: Payer: Self-pay | Admitting: Internal Medicine

## 2022-08-10 ENCOUNTER — Other Ambulatory Visit: Payer: Self-pay | Admitting: Cardiovascular Disease

## 2022-08-10 ENCOUNTER — Other Ambulatory Visit (HOSPITAL_COMMUNITY): Payer: Self-pay

## 2022-08-12 ENCOUNTER — Other Ambulatory Visit: Payer: Self-pay

## 2022-08-12 ENCOUNTER — Other Ambulatory Visit (HOSPITAL_COMMUNITY): Payer: Self-pay

## 2022-08-12 MED ORDER — WARFARIN SODIUM 2 MG PO TABS
ORAL_TABLET | ORAL | 0 refills | Status: AC
  Filled 2022-08-12: qty 135, 90d supply, fill #0

## 2022-08-12 MED ORDER — CARVEDILOL 3.125 MG PO TABS
3.1250 mg | ORAL_TABLET | Freq: Two times a day (BID) | ORAL | 3 refills | Status: DC
  Filled 2022-08-12: qty 180, 90d supply, fill #0
  Filled 2022-12-13: qty 160, 80d supply, fill #1

## 2022-08-12 MED ORDER — ATORVASTATIN CALCIUM 40 MG PO TABS
40.0000 mg | ORAL_TABLET | Freq: Every day | ORAL | 0 refills | Status: DC
  Filled 2022-08-12: qty 90, 90d supply, fill #0

## 2022-08-14 ENCOUNTER — Other Ambulatory Visit (HOSPITAL_COMMUNITY): Payer: Self-pay

## 2022-08-17 ENCOUNTER — Other Ambulatory Visit (HOSPITAL_COMMUNITY): Payer: Self-pay

## 2022-08-23 ENCOUNTER — Ambulatory Visit (INDEPENDENT_AMBULATORY_CARE_PROVIDER_SITE_OTHER): Payer: Medicare HMO | Admitting: Cardiology

## 2022-08-23 DIAGNOSIS — Z953 Presence of xenogenic heart valve: Secondary | ICD-10-CM | POA: Diagnosis not present

## 2022-08-23 DIAGNOSIS — Z7901 Long term (current) use of anticoagulants: Secondary | ICD-10-CM | POA: Diagnosis not present

## 2022-08-23 LAB — POCT INR: INR: 2.1 (ref 2.0–3.0)

## 2022-08-27 ENCOUNTER — Other Ambulatory Visit (HOSPITAL_COMMUNITY): Payer: Self-pay

## 2022-08-27 ENCOUNTER — Other Ambulatory Visit: Payer: Self-pay | Admitting: Internal Medicine

## 2022-08-27 MED ORDER — POTASSIUM CHLORIDE CRYS ER 20 MEQ PO TBCR
20.0000 meq | EXTENDED_RELEASE_TABLET | Freq: Every day | ORAL | 3 refills | Status: DC
  Filled 2022-08-27: qty 90, 90d supply, fill #0
  Filled 2022-11-25: qty 90, 90d supply, fill #1

## 2022-08-31 ENCOUNTER — Other Ambulatory Visit (HOSPITAL_COMMUNITY): Payer: Self-pay

## 2022-09-02 ENCOUNTER — Ambulatory Visit: Payer: Medicare HMO

## 2022-09-02 VITALS — Ht 64.0 in | Wt 131.0 lb

## 2022-09-02 DIAGNOSIS — Z0000 Encounter for general adult medical examination without abnormal findings: Secondary | ICD-10-CM | POA: Diagnosis not present

## 2022-09-02 NOTE — Patient Instructions (Addendum)
[name] , Thank you for taking time to come for your Medicare Wellness Visit. I appreciate your ongoing commitment to your health goals. Please review the following plan we discussed and let me know if I can assist you in the future.   Referrals/Orders/Follow-Ups/Clinician Recommendations: Remember to get your Flu vaccine soon. Happy early blessed birthday to you, hope you enjoy your day.  This is a list of the screening recommended for you and due dates:  Health Maintenance  Topic Date Due   COVID-19 Vaccine (3 - 2023-24 season) 09/07/2021   Flu Shot  08/08/2022   Pneumonia Vaccine (2 of 2 - PPSV23 or PCV20) 03/18/2023*   Medicare Annual Wellness Visit  09/02/2023   Hepatitis C Screening  Completed   HPV Vaccine  Aged Out   DTaP/Tdap/Td vaccine  Discontinued   Zoster (Shingles) Vaccine  Discontinued  *Topic was postponed. The date shown is not the original due date.    Advanced directives: (Copy Requested) Please bring a copy of your health care power of attorney and living will to the office to be added to your chart at your convenience.  Next Medicare Annual Wellness Visit scheduled for next year: No

## 2022-09-02 NOTE — Progress Notes (Signed)
Subjective:   Kashawn E Purnell Shoemaker is a 77 y.o. female who presents for Medicare Annual (Subsequent) preventive examination.  Visit Complete: Virtual  I connected with  Lenah E Purnell Shoemaker on 09/02/22 by a audio enabled telemedicine application and verified that I am speaking with the correct person using two identifiers.  Patient Location: Home  Provider Location: Office/Clinic  I discussed the limitations of evaluation and management by telemedicine. The patient expressed understanding and agreed to proceed.  Vital Signs: Because this visit was a virtual/telehealth visit, some criteria may be missing or patient reported. Any vitals not documented were not able to be obtained and vitals that have been documented are patient reported.    Review of Systems    Cardiac Risk Factors include: advanced age (>87men, >88 women);dyslipidemia;hypertension;Other (see comment), Risk factor comments: A-Fib, CHF, CKD     Objective:    Today's Vitals   09/02/22 1029  Weight: 131 lb (59.4 kg)  Height: 5\' 4"  (1.626 m)   Body mass index is 22.49 kg/m.     09/02/2022   10:40 AM 07/07/2019    1:56 PM 07/06/2019    4:44 PM 05/23/2017    9:40 AM 02/22/2015   12:45 AM 02/21/2015    5:34 PM 02/06/2015   10:56 AM  Advanced Directives  Does Patient Have a Medical Advance Directive? Yes No No No No No No  Type of Estate agent of [place];Living will        Does patient want to make changes to medical advance directive?    Yes (ED - Information included in AVS)     Copy of Healthcare Power of Attorney in Chart? No - copy requested        Would patient like information on creating a medical advance directive?  No - Patient declined No - Patient declined  No - patient declined information No - patient declined information No - patient declined information    Current Medications (verified) Outpatient Encounter Medications as of 09/02/2022  Medication Sig   acetaminophen  (TYLENOL) 325 MG tablet Take 2 tablets (650 mg total) by mouth every 6 (six) hours as needed (pain, fever, headache, body aches).   allopurinol (ZYLOPRIM) 100 MG tablet Take 1 tablet (100 mg total) by mouth daily. Take in addition to 300 mg for total daily dose of 400 mg allopurinol.   allopurinol (ZYLOPRIM) 300 MG tablet Take 1 tablet (300 mg total) by mouth daily.   aspirin EC 81 MG tablet Take 1 tablet (81 mg total) by mouth daily.   atorvastatin (LIPITOR) 40 MG tablet Take 1 tablet (40 mg total) by mouth daily.   carvedilol (COREG) 3.125 MG tablet Take 1 tablet (3.125 mg total) by mouth 2 (two) times daily with a meal.   donepezil (ARICEPT) 10 MG tablet Take 1 tablet (10 mg total) by mouth at bedtime.   Elastic Bandages & Supports (MEDICAL COMPRESSION STOCKINGS) MISC Use daily 10-20 mmHg   hydrALAZINE (APRESOLINE) 50 MG tablet Take 1 and 1/2 tablets (75 mg total) by mouth 3 times daily.   isosorbide mononitrate (IMDUR) 30 MG 24 hr tablet Take 1 tablet (30 mg total) by mouth daily.   pantoprazole (PROTONIX) 40 MG tablet Take 1 tablet (40 mg total) by mouth daily.   potassium chloride SA (KLOR-CON M) 20 MEQ tablet Take 1 tablet (20 mEq total) by mouth daily.   predniSONE (DELTASONE) 20 MG tablet Take 2 tablets (40 mg total) by mouth daily with breakfast.  warfarin (COUMADIN) 2 MG tablet TAKE 1 TO 1 & 1/2 TABLETS BY MOUTH DAILY OR AS DIRECTED BY COUMADIN CLINIC   furosemide (LASIX) 20 MG tablet TAKE 1 TABLET BY MOUTH ONCE A DAY (KEEP APPT)   ondansetron (ZOFRAN) 4 MG tablet Take 1 tablet (4 mg total) by mouth every 6 (six) hours. (Patient not taking: Reported on 09/02/2022)   No facility-administered encounter medications on file as of 09/02/2022.    Allergies (verified) Augmentin [amoxicillin-pot clavulanate]   History: Past Medical History:  Diagnosis Date   Acute CHF (congestive heart failure) (HCC) 12/14/2014   Hyperlipidemia    Hypertension    Prosthetic valve dysfunction 07/22/2014    Acute mitral stenosis due to restricted leaflet mobility of bileaflet mechanical mitral valve prosthesis   S/P MVR (mitral valve replacement) 04/11/1998   #29 St. 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   Valvular endocarditis 2000   Streptococcus   Past Surgical History:  Procedure Laterality Date   ABDOMINAL HYSTERECTOMY     CARDIAC CATHETERIZATION N/A 07/24/2014   Procedure: Right/Left Heart Cath and Coronary Angiography;  Surgeon: Iran Ouch, MD;  Location: [facility] INVASIVE CV LAB;  Service: Cardiovascular;  Laterality: N/A;   CARDIAC CATHETERIZATION N/A 07/22/2014   Procedure: Fluoroscopy Guidance;  Surgeon: Lennette Bihari, MD;  Location: [facility] INVASIVE CV LAB;  Service: Cardiovascular;  Laterality: N/A;   CARDIAC CATHETERIZATION N/A 12/16/2014   Procedure: Right Heart Cath;  Surgeon: Laurey Morale, MD;  Location: [facility] INVASIVE CV LAB;  Service: Cardiovascular;  Laterality: N/A;   CARDIAC VALVE REPLACEMENT     MITRAL VALVE REPLACEMENT  04/11/1998   St. Jude mechanical MVR (severe MR, episode of streptococcal bacterial endocarditis - Dr. Cornelius Moras   MITRAL VALVE REPLACEMENT N/A 07/25/2014   Procedure: REDO MITRAL VALVE REPLACEMENT (MVR);  Surgeon: Purcell Nails, MD;  Location: [facility] OR;  Service: Open Heart Surgery;  Laterality: N/A;   TOTAL ABDOMINAL HYSTERECTOMY W/ BILATERAL SALPINGOOPHORECTOMY  12/14/1998   TRANSTHORACIC ECHOCARDIOGRAM  11/2009   EF=>55%; bi-leaflet St. Jude mech prosthesis; mild TR, RVSP elevated at 40-27mmHg, mod pulm HTN; trace AV regurg; mild pulm valve regurg   TUBAL LIGATION     Family History  Problem Relation Age of Onset   Lung cancer Mother    Heart attack Mother    Heart failure Maternal Grandmother    Heart failure Maternal Grandfather    Stroke  Maternal Grandfather    Stroke Father    Colon cancer Father    Diabetes Father    Stroke Sister    Lung disease Neg Hx    Social History   Socioeconomic History   Marital status: Divorced    Spouse name: Not on file   Number of children: 3   Years of education: 14   Highest education level: Associate degree: academic program  Occupational History   Occupation: Retired  Tobacco Use   Smoking status: Former    Current packs/day: 0.00    Types: Cigarettes    Quit date: 01/17/1973    Years since quitting: 49.6   Smokeless tobacco: Never  Vaping Use   Vaping status: Never Used  Substance and Sexual Activity   Alcohol use: No   Drug use: No   Sexual activity: Not Currently  Other Topics Concern  Not on file  Social History Narrative   [facility] (03/27/16):   Originally from [place] [facility] [place] [facility] Lives with her daughter in a townhouse. Patient did get lost while driving around this winter and is no longer allowed to drive. No pets currently. No bird or mold exposure.       Has 3 children   Some college education (trade school)    Worked as Systems developer with [employer]    Social Determinants of Health   Financial Resource Strain: Low Risk  (09/02/2022)   Overall Financial Resource Strain (CARDIA)    Difficulty of Paying Living Expenses: Not hard at all  Food Insecurity: No Food Insecurity (09/02/2022)   Hunger Vital Sign    Worried About Running Out of Food in the Last Year: Never true    Ran Out of Food in the Last Year: Never true  Transportation Needs: No Transportation Needs (09/02/2022)   PRAPARE - Administrator, Civil Service (Medical): No    Lack of Transportation (Non-Medical): No  Physical Activity: Insufficiently Active (09/02/2022)   Exercise Vital Sign    Days of Exercise per Week: 3 days    Minutes of Exercise per Session: 10 min  Stress: No Stress Concern Present (09/02/2022)   [employer] of Occupational Health - Occupational Stress  Questionnaire    Feeling of Stress : Not at all  Social Connections: Socially Isolated (09/02/2022)   Social Connection and Isolation Panel [NHANES]    Frequency of Communication with Friends and Family: Never    Frequency of Social Gatherings with Friends and Family: Never    Attends Religious Services: Never    Database administrator or Organizations: No    Attends Engineer, structural: Never    Marital Status: Divorced    Tobacco Counseling Counseling given: Not Answered   Clinical Intake:  Pre-visit preparation completed: Yes  Pain : No/denies pain     BMI - recorded: 22.49 Diabetes: No  How often do you need to have someone help you when you read instructions, pamphlets, or other written materials from your doctor or pharmacy?: 5 - Always  Interpreter Needed?: No  Comments: Patient's daughter did visit. Information entered by :: Fortino Sic, RMA   Activities of Daily Living    09/02/2022   10:36 AM  In your present state of health, do you have any difficulty performing the following activities:  Hearing? 0  Vision? 0  Difficulty concentrating or making decisions? 1  Walking or climbing stairs? 1  Dressing or bathing? 1  Doing errands, shopping? 1  Preparing Food and eating ? N  Using the Toilet? N  In the past six months, have you accidently leaked urine? Y  Do you have problems with loss of bowel control? Y  Managing your Medications? Y  Managing your Finances? Y  Housekeeping or managing your Housekeeping? Y    Patient Care Team: Myrlene Broker, MD as PCP - General (Internal Medicine) Rennis Golden Lisette Abu, MD as PCP - Cardiology (Cardiology) Szabat, Vinnie Level, [facility] (Inactive) as Pharmacist (Pharmacist)  Indicate any recent Medical Services you may have received from other than [facility] providers in the past year (date may be approximate).     Assessment:   This is a routine wellness examination for [place].  Hearing/Vision  screen Hearing Screening - Comments:: Denies hearing difficulties   Vision Screening - Comments:: Denies vision issues.  Dietary issues and exercise activities discussed:     Goals Addressed  None   Depression Screen    09/02/2022   10:45 AM 03/18/2022    1:07 PM 08/24/2021    9:05 AM 07/07/2019    1:58 PM 07/06/2019    4:43 PM 05/23/2017    9:40 AM 10/15/2016    4:04 PM  PHQ 2/9 Scores  PHQ - 2 Score  1 3 1  1  0  PHQ- 9 Score  7 10   1    Exception Documentation Other- indicate reason in comment box   Medical reason Medical reason    Not completed Patient could not answer these questions due to her condition, as her daughter [name]) did the visit for her.          Fall Risk    09/02/2022   10:41 AM 03/18/2022    1:07 PM 08/24/2021    9:05 AM 07/07/2019    1:57 PM 07/06/2019    4:45 PM  Fall Risk   Falls in the past year? 0 0 0 0 0  Number falls in past yr: 0 0 0 0 0  Injury with Fall? 0 0 0 0 0  Risk for fall due to : No Fall Risks No Fall Risks No Fall Risks Mental status change   Follow up Falls prevention discussed;Falls evaluation completed Falls evaluation completed Falls evaluation completed Education provided;Falls prevention discussed     MEDICARE RISK AT HOME: Medicare Risk at Home Any stairs in or around the home?: No Home free of loose throw rugs in walkways, pet beds, electrical cords, etc?: Yes Adequate lighting in your home to reduce risk of falls?: Yes Life alert?: No Use of a cane, walker or w/c?: No Grab bars in the bathroom?: No Shower chair or bench in shower?: No Elevated toilet seat or a handicapped toilet?: No  TIMED UP AND GO:  Was the test performed?  No    Cognitive Function:    05/23/2017    9:41 AM 03/20/2017    9:00 AM  MMSE - Mini Mental State Exam  Orientation to time 0 2  Orientation to Place 0 4  Registration 3 3  Attention/ Calculation 2 2  Recall 0 0  Language- name 2 objects 2 2  Language- repeat 1 1  Language- follow 3  step command 3 3  Language- read & follow direction 1 1  Write a sentence 1 1  Copy design 1 1  Total score 14 20        09/02/2022   10:56 AM  6CIT Screen  What Year? --    Immunizations Immunization History  Administered Date(s) Administered   Fluad Quad(high Dose 65+) 10/23/2020   Influenza, High Dose Seasonal PF 10/15/2016   Influenza-Unspecified 09/08/2015   PFIZER(Purple Top)SARS-COV-2 Vaccination 03/26/2019, 04/17/2019   Pneumococcal Conjugate-13 04/03/2017    TDAP status: Up to date  Flu Vaccine status: Due, Education has been provided regarding the importance of this vaccine. Advised may receive this vaccine at local pharmacy or Health Dept. Aware to provide a copy of the vaccination record if obtained from local pharmacy or Health Dept. Verbalized acceptance and understanding.  Pneumococcal vaccine status: Due, Education has been provided regarding the importance of this vaccine. Advised may receive this vaccine at local pharmacy or Health Dept. Aware to provide a copy of the vaccination record if obtained from local pharmacy or Health Dept. Verbalized acceptance and understanding.  Covid-19 vaccine status: Declined, Education has been provided regarding the importance of this vaccine but patient still declined. Advised may  receive this vaccine at local pharmacy or Health Dept.or vaccine clinic. Aware to provide a copy of the vaccination record if obtained from local pharmacy or Health Dept. Verbalized acceptance and understanding.  Qualifies for Shingles Vaccine? Yes   Zostavax completed No   Shingrix Completed?: No.    Education has been provided regarding the importance of this vaccine. Patient has been advised to call insurance company to determine out of pocket expense if they have not yet received this vaccine. Advised may also receive vaccine at local pharmacy or Health Dept. Verbalized acceptance and understanding.  Screening Tests Health Maintenance  Topic Date  Due   COVID-19 Vaccine (3 - 2023-24 season) 09/07/2021   INFLUENZA VACCINE  08/08/2022   Pneumonia Vaccine 14+ Years old (2 of 2 - PPSV23 or PCV20) 03/18/2023 (Originally 04/04/2018)   Medicare Annual Wellness (AWV)  09/02/2023   Hepatitis C Screening  Completed   HPV VACCINES  Aged Out   DTaP/Tdap/Td  Discontinued   Zoster Vaccines- Shingrix  Discontinued    Health Maintenance  Health Maintenance Due  Topic Date Due   COVID-19 Vaccine (3 - 2023-24 season) 09/07/2021   INFLUENZA VACCINE  08/08/2022    Colorectal cancer screening: No longer required.   Mammogram status: No longer required due to age.   Lung Cancer Screening: (Low Dose CT Chest recommended if Age 95-80 years, 20 pack-year currently smoking OR have quit w/in 15years.) does not qualify.   Lung Cancer Screening Referral: N/A  Additional Screening:  Hepatitis C Screening: does qualify; Completed 08/24/2021  Vision Screening: Recommended annual ophthalmology exams for early detection of glaucoma and other disorders of the eye. Is the patient up to date with their annual eye exam?  No  Who is the provider or what is the name of the office in which the patient attends annual eye exams? N/A If pt is not established with a provider, would they like to be referred to a provider to establish care? Yes .   Dental Screening: Recommended annual dental exams for proper oral hygiene   Community Resource Referral / Chronic Care Management: CRR required this visit?  No   CCM required this visit?  No     Plan:     I have personally reviewed and noted the following in the patient's chart:   Medical and social history Use of alcohol, tobacco or illicit drugs  Current medications and supplements including opioid prescriptions. Patient is not currently taking opioid prescriptions. Functional ability and status Nutritional status Physical activity Advanced directives List of other physicians Hospitalizations,  surgeries, and ER visits in previous 12 months Vitals Screenings to include cognitive, depression, and falls Referrals and appointments  In addition, I have reviewed and discussed with patient certain preventive protocols, quality metrics, and best practice recommendations. A written personalized care plan for preventive services as well as general preventive health recommendations were provided to patient.     Antoino Westhoff L Izaiyah Kleinman, CMA   09/02/2022   After Visit Summary: (MyChart) Due to this being a telephonic visit, the after visit summary with patients personalized plan was offered to patient via MyChart   Nurse Notes: Patient could not answer the PHQ-9 questions due to her condition, as her daughter [name]) did the visit for her. Also patient could not do the 6CIT as well, due to her condition.  Patient is due for a Flu vaccine and PPSV23.  Per patient's daughter, declines DEXA, mammogram and colonoscopy.  There were no further concerns today.

## 2022-09-06 ENCOUNTER — Ambulatory Visit (INDEPENDENT_AMBULATORY_CARE_PROVIDER_SITE_OTHER): Payer: Medicare HMO | Admitting: Cardiology

## 2022-09-06 DIAGNOSIS — Z7901 Long term (current) use of anticoagulants: Secondary | ICD-10-CM | POA: Diagnosis not present

## 2022-09-06 DIAGNOSIS — Z953 Presence of xenogenic heart valve: Secondary | ICD-10-CM

## 2022-09-06 LAB — POCT INR: INR: 3.8 — AB (ref 2.0–3.0)

## 2022-09-13 ENCOUNTER — Ambulatory Visit (INDEPENDENT_AMBULATORY_CARE_PROVIDER_SITE_OTHER): Payer: Medicare HMO | Admitting: Cardiology

## 2022-09-13 DIAGNOSIS — Z7901 Long term (current) use of anticoagulants: Secondary | ICD-10-CM

## 2022-09-13 DIAGNOSIS — Z953 Presence of xenogenic heart valve: Secondary | ICD-10-CM | POA: Diagnosis not present

## 2022-09-13 LAB — POCT INR: INR: 3.7 — AB (ref 2.0–3.0)

## 2022-09-19 ENCOUNTER — Other Ambulatory Visit (HOSPITAL_COMMUNITY): Payer: Self-pay

## 2022-09-20 ENCOUNTER — Ambulatory Visit (INDEPENDENT_AMBULATORY_CARE_PROVIDER_SITE_OTHER): Payer: Medicare HMO | Admitting: Cardiology

## 2022-09-20 DIAGNOSIS — Z952 Presence of prosthetic heart valve: Secondary | ICD-10-CM | POA: Diagnosis not present

## 2022-09-20 DIAGNOSIS — Z7901 Long term (current) use of anticoagulants: Secondary | ICD-10-CM

## 2022-09-20 DIAGNOSIS — Z953 Presence of xenogenic heart valve: Secondary | ICD-10-CM | POA: Diagnosis not present

## 2022-09-20 LAB — POCT INR: INR: 2.1 (ref 2.0–3.0)

## 2022-09-29 ENCOUNTER — Other Ambulatory Visit: Payer: Self-pay | Admitting: Internal Medicine

## 2022-09-30 ENCOUNTER — Other Ambulatory Visit: Payer: Self-pay

## 2022-09-30 ENCOUNTER — Other Ambulatory Visit (HOSPITAL_COMMUNITY): Payer: Self-pay

## 2022-09-30 MED ORDER — PREDNISONE 20 MG PO TABS
40.0000 mg | ORAL_TABLET | Freq: Every day | ORAL | 4 refills | Status: DC
  Filled 2022-09-30: qty 10, 5d supply, fill #0
  Filled 2022-11-18: qty 10, 5d supply, fill #1
  Filled 2022-12-06: qty 10, 5d supply, fill #2
  Filled 2022-12-12: qty 10, 5d supply, fill #3
  Filled 2022-12-22: qty 10, 5d supply, fill #4

## 2022-09-30 MED ORDER — ALLOPURINOL 100 MG PO TABS
100.0000 mg | ORAL_TABLET | Freq: Every day | ORAL | 3 refills | Status: DC
  Filled 2022-09-30: qty 90, 90d supply, fill #0

## 2022-10-01 ENCOUNTER — Other Ambulatory Visit (HOSPITAL_COMMUNITY): Payer: Self-pay

## 2022-10-01 MED ORDER — PANTOPRAZOLE SODIUM 40 MG PO TBEC
40.0000 mg | DELAYED_RELEASE_TABLET | Freq: Every day | ORAL | 2 refills | Status: DC
  Filled 2022-10-01: qty 90, 90d supply, fill #0
  Filled 2022-12-30: qty 90, 90d supply, fill #1

## 2022-10-01 MED ORDER — ISOSORBIDE MONONITRATE ER 30 MG PO TB24
30.0000 mg | ORAL_TABLET | Freq: Every day | ORAL | 2 refills | Status: DC
  Filled 2022-10-01: qty 90, 90d supply, fill #0
  Filled 2022-12-30: qty 90, 90d supply, fill #1

## 2022-10-04 ENCOUNTER — Ambulatory Visit (INDEPENDENT_AMBULATORY_CARE_PROVIDER_SITE_OTHER): Payer: Self-pay | Admitting: Internal Medicine

## 2022-10-04 DIAGNOSIS — Z953 Presence of xenogenic heart valve: Secondary | ICD-10-CM

## 2022-10-04 DIAGNOSIS — I4891 Unspecified atrial fibrillation: Secondary | ICD-10-CM | POA: Diagnosis not present

## 2022-10-04 DIAGNOSIS — Z5181 Encounter for therapeutic drug level monitoring: Secondary | ICD-10-CM

## 2022-10-04 LAB — POCT INR: INR: 2.9 (ref 2.0–3.0)

## 2022-10-04 NOTE — Patient Instructions (Signed)
Description    Spoke to pt's daughter and instructed for pt to  continue taking Warfarin 1 tablet daily except for 1/2 a tablet on Sundays, Tuesdays and Thursdays.   Recheck INR in 2 weeks.  [facility] Clinic [phone]

## 2022-10-18 ENCOUNTER — Ambulatory Visit (INDEPENDENT_AMBULATORY_CARE_PROVIDER_SITE_OTHER): Payer: Medicare HMO | Admitting: Cardiology

## 2022-10-18 DIAGNOSIS — Z953 Presence of xenogenic heart valve: Secondary | ICD-10-CM

## 2022-10-18 DIAGNOSIS — Z7901 Long term (current) use of anticoagulants: Secondary | ICD-10-CM

## 2022-10-18 LAB — POCT INR: INR: 3.6 — AB (ref 2.0–3.0)

## 2022-10-25 ENCOUNTER — Encounter: Payer: Medicare HMO | Admitting: Internal Medicine

## 2022-10-26 ENCOUNTER — Other Ambulatory Visit (HOSPITAL_COMMUNITY): Payer: Self-pay

## 2022-10-27 LAB — POCT INR: INR: 5.6 — AB (ref 2.0–3.0)

## 2022-10-28 ENCOUNTER — Ambulatory Visit (INDEPENDENT_AMBULATORY_CARE_PROVIDER_SITE_OTHER): Payer: Self-pay | Admitting: Cardiology

## 2022-10-28 DIAGNOSIS — Z953 Presence of xenogenic heart valve: Secondary | ICD-10-CM

## 2022-10-28 DIAGNOSIS — Z7901 Long term (current) use of anticoagulants: Secondary | ICD-10-CM | POA: Diagnosis not present

## 2022-11-01 ENCOUNTER — Ambulatory Visit (INDEPENDENT_AMBULATORY_CARE_PROVIDER_SITE_OTHER): Payer: Medicare HMO | Admitting: Cardiovascular Disease

## 2022-11-01 DIAGNOSIS — Z7901 Long term (current) use of anticoagulants: Secondary | ICD-10-CM | POA: Diagnosis not present

## 2022-11-01 DIAGNOSIS — Z953 Presence of xenogenic heart valve: Secondary | ICD-10-CM | POA: Diagnosis not present

## 2022-11-01 LAB — POCT INR: INR: 3.1 — AB (ref 2.0–3.0)

## 2022-11-05 ENCOUNTER — Encounter: Payer: Medicare HMO | Admitting: Internal Medicine

## 2022-11-11 ENCOUNTER — Ambulatory Visit (INDEPENDENT_AMBULATORY_CARE_PROVIDER_SITE_OTHER): Payer: Medicare HMO | Admitting: Cardiology

## 2022-11-11 DIAGNOSIS — Z952 Presence of prosthetic heart valve: Secondary | ICD-10-CM | POA: Diagnosis not present

## 2022-11-11 DIAGNOSIS — Z953 Presence of xenogenic heart valve: Secondary | ICD-10-CM | POA: Diagnosis not present

## 2022-11-11 DIAGNOSIS — Z7901 Long term (current) use of anticoagulants: Secondary | ICD-10-CM

## 2022-11-11 LAB — POCT INR: INR: 5.4 — AB (ref 2.0–3.0)

## 2022-11-14 LAB — POCT INR: INR: 2.3 (ref 2.0–3.0)

## 2022-11-15 ENCOUNTER — Ambulatory Visit (INDEPENDENT_AMBULATORY_CARE_PROVIDER_SITE_OTHER): Payer: Self-pay | Admitting: Cardiovascular Disease

## 2022-11-15 DIAGNOSIS — Z7901 Long term (current) use of anticoagulants: Secondary | ICD-10-CM

## 2022-11-15 DIAGNOSIS — Z953 Presence of xenogenic heart valve: Secondary | ICD-10-CM

## 2022-11-18 ENCOUNTER — Other Ambulatory Visit (HOSPITAL_COMMUNITY): Payer: Self-pay

## 2022-11-18 ENCOUNTER — Other Ambulatory Visit: Payer: Self-pay

## 2022-11-18 ENCOUNTER — Other Ambulatory Visit: Payer: Self-pay | Admitting: Internal Medicine

## 2022-11-18 MED ORDER — ATORVASTATIN CALCIUM 40 MG PO TABS
40.0000 mg | ORAL_TABLET | Freq: Every day | ORAL | 0 refills | Status: DC
  Filled 2022-11-18: qty 90, 90d supply, fill #0

## 2022-11-21 LAB — POCT INR: INR: 2 (ref 2.0–3.0)

## 2022-11-22 ENCOUNTER — Ambulatory Visit (INDEPENDENT_AMBULATORY_CARE_PROVIDER_SITE_OTHER): Payer: Medicare HMO | Admitting: Internal Medicine

## 2022-11-22 DIAGNOSIS — Z7901 Long term (current) use of anticoagulants: Secondary | ICD-10-CM

## 2022-11-22 DIAGNOSIS — Z953 Presence of xenogenic heart valve: Secondary | ICD-10-CM

## 2022-11-23 ENCOUNTER — Other Ambulatory Visit (HOSPITAL_COMMUNITY): Payer: Self-pay

## 2022-11-23 ENCOUNTER — Other Ambulatory Visit: Payer: Self-pay | Admitting: Internal Medicine

## 2022-11-25 ENCOUNTER — Other Ambulatory Visit (HOSPITAL_COMMUNITY): Payer: Self-pay

## 2022-11-26 ENCOUNTER — Other Ambulatory Visit (HOSPITAL_COMMUNITY): Payer: Self-pay

## 2022-11-26 MED ORDER — FUROSEMIDE 20 MG PO TABS
ORAL_TABLET | ORAL | 2 refills | Status: DC
  Filled 2022-11-26: qty 90, 90d supply, fill #0

## 2022-12-03 LAB — POCT INR: INR: 1.6 — AB (ref 2.0–3.0)

## 2022-12-04 ENCOUNTER — Ambulatory Visit (INDEPENDENT_AMBULATORY_CARE_PROVIDER_SITE_OTHER): Payer: Medicare HMO | Admitting: Cardiovascular Disease

## 2022-12-04 DIAGNOSIS — Z953 Presence of xenogenic heart valve: Secondary | ICD-10-CM

## 2022-12-04 DIAGNOSIS — Z7901 Long term (current) use of anticoagulants: Secondary | ICD-10-CM

## 2022-12-06 ENCOUNTER — Other Ambulatory Visit (HOSPITAL_COMMUNITY): Payer: Self-pay

## 2022-12-13 ENCOUNTER — Ambulatory Visit (INDEPENDENT_AMBULATORY_CARE_PROVIDER_SITE_OTHER): Payer: Medicare HMO | Admitting: Cardiology

## 2022-12-13 ENCOUNTER — Other Ambulatory Visit (HOSPITAL_COMMUNITY): Payer: Self-pay

## 2022-12-13 DIAGNOSIS — Z7901 Long term (current) use of anticoagulants: Secondary | ICD-10-CM | POA: Diagnosis not present

## 2022-12-13 DIAGNOSIS — Z953 Presence of xenogenic heart valve: Secondary | ICD-10-CM

## 2022-12-13 DIAGNOSIS — Z952 Presence of prosthetic heart valve: Secondary | ICD-10-CM | POA: Diagnosis not present

## 2022-12-13 LAB — POCT INR: INR: 2.8 (ref 2.0–3.0)

## 2022-12-20 ENCOUNTER — Ambulatory Visit (INDEPENDENT_AMBULATORY_CARE_PROVIDER_SITE_OTHER): Payer: Self-pay | Admitting: Cardiology

## 2022-12-20 DIAGNOSIS — Z7901 Long term (current) use of anticoagulants: Secondary | ICD-10-CM

## 2022-12-20 DIAGNOSIS — Z953 Presence of xenogenic heart valve: Secondary | ICD-10-CM

## 2022-12-20 LAB — POCT INR: INR: 3.3 — AB (ref 2.0–3.0)

## 2022-12-23 ENCOUNTER — Other Ambulatory Visit (HOSPITAL_COMMUNITY): Payer: Self-pay

## 2022-12-26 ENCOUNTER — Other Ambulatory Visit (HOSPITAL_COMMUNITY): Payer: Self-pay

## 2022-12-30 ENCOUNTER — Telehealth: Payer: Self-pay

## 2022-12-30 ENCOUNTER — Other Ambulatory Visit (HOSPITAL_COMMUNITY): Payer: Self-pay

## 2022-12-30 ENCOUNTER — Other Ambulatory Visit: Payer: Self-pay | Admitting: Internal Medicine

## 2022-12-30 MED ORDER — PREDNISONE 20 MG PO TABS
40.0000 mg | ORAL_TABLET | Freq: Every day | ORAL | 4 refills | Status: DC
  Filled 2022-12-30: qty 10, 5d supply, fill #0
  Filled 2023-01-17: qty 10, 5d supply, fill #1

## 2022-12-30 NOTE — Telephone Encounter (Signed)
Copied from CRM [id number]. Topic: Clinical - Medication Refill >> Dec 30, 2022  7:46 AM Kathryne Eriksson wrote: Most Recent Primary Care Visit:  Provider: Wyvonne Lenz  Department: [facility]  Visit Type: MEDICARE AWV, SEQUENTIAL  Date: 09/02/2022  Medication: Prednisone 20MG   Has the patient contacted their pharmacy? Yes (Agent: If no, request that the patient contact the pharmacy for the refill. If patient does not wish to contact the pharmacy document the reason why and proceed with request.) (Agent: If yes, when and what did the pharmacy advise?)  Is this the correct pharmacy for this prescription? Yes If no, delete pharmacy and type the correct one.  This is the patient's preferred pharmacy:  [facility] [address] [place] [address] Phone: [phone] Fax: [phone]   Has the prescription been filled recently? Yes  Is the patient out of the medication? Yes  Has the patient been seen for an appointment in the last year OR does the patient have an upcoming appointment? Yes  Can we respond through MyChart? Yes  Agent: Please be advised that Rx refills may take up to 3 business days. We ask that you follow-up with your pharmacy.

## 2022-12-30 NOTE — Telephone Encounter (Signed)
Lp's daughter message to check INR

## 2022-12-31 ENCOUNTER — Ambulatory Visit (INDEPENDENT_AMBULATORY_CARE_PROVIDER_SITE_OTHER): Payer: Self-pay | Admitting: Cardiovascular Disease

## 2022-12-31 DIAGNOSIS — Z953 Presence of xenogenic heart valve: Secondary | ICD-10-CM | POA: Diagnosis not present

## 2022-12-31 DIAGNOSIS — Z952 Presence of prosthetic heart valve: Secondary | ICD-10-CM | POA: Diagnosis not present

## 2022-12-31 DIAGNOSIS — Z7901 Long term (current) use of anticoagulants: Secondary | ICD-10-CM | POA: Diagnosis not present

## 2022-12-31 LAB — POCT INR: INR: 3.3 — AB (ref 2.0–3.0)

## 2023-01-09 ENCOUNTER — Ambulatory Visit: Payer: Medicare HMO | Admitting: Internal Medicine

## 2023-01-10 ENCOUNTER — Ambulatory Visit (INDEPENDENT_AMBULATORY_CARE_PROVIDER_SITE_OTHER): Payer: 59 | Admitting: Cardiovascular Disease

## 2023-01-10 DIAGNOSIS — Z953 Presence of xenogenic heart valve: Secondary | ICD-10-CM | POA: Diagnosis not present

## 2023-01-10 DIAGNOSIS — Z7901 Long term (current) use of anticoagulants: Secondary | ICD-10-CM

## 2023-01-10 LAB — POCT INR: INR: 4.2 — AB (ref 2.0–3.0)

## 2023-01-13 ENCOUNTER — Ambulatory Visit: Payer: Medicare HMO | Admitting: Internal Medicine

## 2023-01-17 ENCOUNTER — Other Ambulatory Visit: Payer: Self-pay

## 2023-01-20 ENCOUNTER — Ambulatory Visit (INDEPENDENT_AMBULATORY_CARE_PROVIDER_SITE_OTHER): Payer: 59 | Admitting: Emergency Medicine

## 2023-01-20 ENCOUNTER — Other Ambulatory Visit (HOSPITAL_COMMUNITY): Payer: Self-pay

## 2023-01-20 ENCOUNTER — Encounter: Payer: Self-pay | Admitting: Emergency Medicine

## 2023-01-20 ENCOUNTER — Ambulatory Visit: Payer: Self-pay | Admitting: Internal Medicine

## 2023-01-20 ENCOUNTER — Ambulatory Visit (INDEPENDENT_AMBULATORY_CARE_PROVIDER_SITE_OTHER): Payer: 59

## 2023-01-20 VITALS — BP 120/72 | HR 80 | Ht 64.0 in | Wt 125.0 lb

## 2023-01-20 DIAGNOSIS — R0989 Other specified symptoms and signs involving the circulatory and respiratory systems: Secondary | ICD-10-CM

## 2023-01-20 DIAGNOSIS — R590 Localized enlarged lymph nodes: Secondary | ICD-10-CM

## 2023-01-20 DIAGNOSIS — R918 Other nonspecific abnormal finding of lung field: Secondary | ICD-10-CM | POA: Diagnosis not present

## 2023-01-20 DIAGNOSIS — J90 Pleural effusion, not elsewhere classified: Secondary | ICD-10-CM | POA: Diagnosis not present

## 2023-01-20 DIAGNOSIS — I517 Cardiomegaly: Secondary | ICD-10-CM | POA: Diagnosis not present

## 2023-01-20 LAB — COMPREHENSIVE METABOLIC PANEL
ALT: 13 U/L (ref 0–35)
AST: 11 U/L (ref 0–37)
Albumin: 3.4 g/dL — ABNORMAL LOW (ref 3.5–5.2)
Alkaline Phosphatase: 90 U/L (ref 39–117)
BUN: 27 mg/dL — ABNORMAL HIGH (ref 6–23)
CO2: 26 meq/L (ref 19–32)
Calcium: 10 mg/dL (ref 8.4–10.5)
Chloride: 111 meq/L (ref 96–112)
Creatinine, Ser: 1.68 mg/dL — ABNORMAL HIGH (ref 0.40–1.20)
GFR: 29.6 mL/min — ABNORMAL LOW (ref >60.00)
Glucose, Bld: 179 mg/dL — ABNORMAL HIGH (ref 70–99)
Potassium: 4.2 meq/L (ref 3.5–5.1)
Sodium: 146 meq/L — ABNORMAL HIGH (ref 135–145)
Total Bilirubin: 2.7 mg/dL — ABNORMAL HIGH (ref 0.2–1.2)
Total Protein: 6.7 g/dL (ref 6.0–8.3)

## 2023-01-20 MED ORDER — CLINDAMYCIN HCL 150 MG PO CAPS
150.0000 mg | ORAL_CAPSULE | Freq: Three times a day (TID) | ORAL | 0 refills | Status: DC
  Filled 2023-01-20: qty 21, 7d supply, fill #0

## 2023-01-20 NOTE — Patient Instructions (Signed)
 Lymphadenopathy  Lymphadenopathy is when your lymph glands are swollen or larger than normal.  Lymph glands, also called lymph nodes, are clumps of tissue. They filter germs and waste from tissues in your body to your bloodstream. They're part of your body's defense system, or immune system. Lymphadenopathy has different causes, like infection, autoimmune disease, and cancer. Lymphadenopathy can happen wherever you have lymph nodes. The type you have depends on which nodes it's in, such as: Cervical lymphadenopathy. This is in the neck. Mediastinal lymphadenopathy. This is in the chest. Hilar lymphadenopathy. This is in the lungs. Axillary lymphadenopathy. This is in the armpits. Inguinal lymphadenopathy. This is in the groin. Sometimes, fluid and cells that fight infection build up in your lymph nodes. This happens when your immune system reacts to germs or other substances that get into your body. This makes lymph nodes swell and get bigger. Treatment is based on what's thought to be the cause. Sometimes, lymph nodes don't go back to normal size after treatment. If yours don't, your health care provider may order tests to help learn why your glands are still swollen and big. Follow these instructions at home:  Take over-the-counter and prescription medicines only as told by your provider. If you were prescribed antibiotics, do not stop using them, even if you start to feel better. If told, apply heat to swollen lymph nodes as told by your provider. Use the heat source that your provider recommends, such as a moist heat pack or a heating pad. Place a towel between your skin and the heat source. Leave the heat on only for the time told by your provider to avoid injury. If your skin turns bright red, remove the heat right away to prevent burns. The risk of burns is higher if you cannot feel pain, heat, or cold. Check your swollen lymph nodes every day for changes. Check other places where you have  lymph nodes as told. Check for changes such as: More swelling. Sudden growth in size. Redness or pain. Hardness. Contact a health care provider if: You have lymph nodes that: Are still swollen after 2 weeks. Have gotten bigger all of a sudden or the swelling spreads. Are red, painful, or hard. Fluid leaks from the skin near a swollen lymph node. You get a fever, chills, or night sweats. You feel tired. You have a sore throat. Your abdomen hurts. You lose weight without trying. This information is not intended to replace advice given to you by your health care provider. Make sure you discuss any questions you have with your health care provider. Document Revised: 03/20/2022 Document Reviewed: 03/20/2022 Elsevier Patient Education  2024 [employer].

## 2023-01-20 NOTE — Progress Notes (Signed)
 [name] Finder 78 y.o.   Chief Complaint  Patient presents with   Facial Swelling    Chin swelling normally doesn't have excessive skin having from her chin. started Friday. Left breast was really swollen 2 weeks ago gone and has came back. Both legs has scabbing. Pt does have chest congestion. No fever,     HISTORY OF PRESENT ILLNESS: Acute visit today.  Patient of Dr. Almarie Cleveland This is a 78 y.o. female complaining of lump to right side of neck noticed last Friday Also had swelling on left breast 2 weeks ago which is now gone Some chest congestion.  No fever. Accompanied by daughter and granddaughter today. No other complaints or medical concerns today.  HPI   Prior to Admission medications   Medication Sig Start Date End Date Taking? Authorizing Provider  acetaminophen  (TYLENOL ) 325 MG tablet Take 2 tablets (650 mg total) by mouth every 6 (six) hours as needed (pain, fever, headache, body aches). 09/09/20  Yes Elnor Hila P, DO  allopurinol  (ZYLOPRIM ) 100 MG tablet Take 1 tablet (100 mg total) by mouth daily. Take in addition to 300 mg for total daily dose of 400 mg allopurinol . 09/30/22  Yes Cleveland Almarie LABOR, MD  allopurinol  (ZYLOPRIM ) 300 MG tablet Take 1 tablet (300 mg total) by mouth daily. 01/14/22  Yes Cleveland Almarie LABOR, MD  aspirin  EC 81 MG tablet Take 1 tablet (81 mg total) by mouth daily. 02/24/15  Yes Cindy Garnette POUR, MD  atorvastatin  (LIPITOR) 40 MG tablet Take 1 tablet (40 mg total) by mouth daily. 11/18/22  Yes Cleveland Almarie LABOR, MD  carvedilol  (COREG ) 3.125 MG tablet Take 1 tablet (3.125 mg total) by mouth 2 (two) times daily with a meal. 08/12/22 08/12/23 Yes Burnard Debby LABOR, MD  donepezil  (ARICEPT ) 10 MG tablet Take 1 tablet (10 mg total) by mouth at bedtime. 01/14/22  Yes Cleveland Almarie LABOR, MD  Elastic Bandages & Supports (MEDICAL COMPRESSION STOCKINGS) MISC Use daily 10-20 mmHg 03/25/22  Yes Cleveland Almarie LABOR, MD  furosemide  (LASIX ) 20 MG  tablet TAKE 1 TABLET BY MOUTH ONCE A DAY (KEEP APPT) 11/26/22 11/26/23 Yes Hilty, Vinie BROCKS, MD  hydrALAZINE  (APRESOLINE ) 50 MG tablet Take 1 and 1/2 tablets (75 mg total) by mouth 3 times daily. 07/01/22  Yes Cleveland Almarie LABOR, MD  isosorbide  mononitrate (IMDUR ) 30 MG 24 hr tablet Take 1 tablet (30 mg total) by mouth daily. 10/01/22  Yes Hilty, Vinie BROCKS, MD  ondansetron  (ZOFRAN ) 4 MG tablet Take 1 tablet (4 mg total) by mouth every 6 (six) hours. 09/09/20  Yes Elnor Hila P, DO  pantoprazole  (PROTONIX ) 40 MG tablet Take 1 tablet (40 mg total) by mouth daily. 10/01/22  Yes Hilty, Vinie BROCKS, MD  potassium chloride  SA (KLOR-CON  M) 20 MEQ tablet Take 1 tablet (20 mEq total) by mouth daily. 08/27/22  Yes Cleveland Almarie LABOR, MD  predniSONE  (DELTASONE ) 20 MG tablet Take 2 tablets (40 mg total) by mouth daily with breakfast for 5 days 12/30/22  Yes Cleveland Almarie LABOR, MD  warfarin (COUMADIN ) 2 MG tablet TAKE 1 TO 1 & 1/2 TABLETS BY MOUTH DAILY OR AS DIRECTED BY COUMADIN  CLINIC 08/12/22 11/15/22  Mona Vinie BROCKS, MD    Allergies  Allergen Reactions   Augmentin [Amoxicillin-Pot Clavulanate] Other (See Comments)    Unknown reaction Has patient had a PCN reaction causing immediate rash, facial/tongue/throat swelling, SOB or lightheadedness with hypotension Has patient had a PCN reaction causing severe rash involving mucus membranes or  skin necrosis Has patient had a PCN reaction that required hospitalization  Has patient had a PCN reaction occurring within the last 10 years] If all of the above answers are NO, then may proceed with Cephalosporin use.     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   S/P mitral valve  replacement with bioprosthetic valve 04/25/2015   Essential hypertension 04/25/2015   Chronic anticoagulation 04/25/2015   Intracranial vascular stenosis    Hyperlipidemia    Chronic systolic CHF (congestive heart failure) (HCC) 01/04/2015   CKD (chronic kidney disease) stage 3, GFR 30-59 ml/min (HCC) 01/04/2015   Atrial fibrillation (HCC) 09/22/2014   RBBB 03/11/2014    Past Medical History:  Diagnosis Date   Acute CHF (congestive heart failure) (HCC) 12/14/2014   Hyperlipidemia    Hypertension    Prosthetic valve dysfunction 07/22/2014   Acute mitral stenosis due to restricted leaflet mobility of bileaflet mechanical mitral valve prosthesis   S/P MVR (mitral valve replacement) 04/11/1998   #29 St. 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/25/2014   Valvular endocarditis 2000   Streptococcus    Past Surgical History:  Procedure Laterality Date   ABDOMINAL HYSTERECTOMY     CARDIAC CATHETERIZATION N/A 07/24/2014   Procedure: Right/Left Heart Cath and Coronary Angiography;  Surgeon: Deatrice DELENA Cage, MD;  Location: [facility] INVASIVE CV LAB;  Service: Cardiovascular;  Laterality: N/A;   CARDIAC CATHETERIZATION N/A 07/22/2014   Procedure: Fluoroscopy Guidance;  Surgeon: Debby DELENA Sor, MD;  Location: [facility] INVASIVE CV LAB;  Service: Cardiovascular;  Laterality: N/A;   CARDIAC CATHETERIZATION N/A 12/16/2014   Procedure: Right Heart Cath;  Surgeon: Ezra GORMAN Shuck, MD;  Location: [facility] INVASIVE CV LAB;  Service: Cardiovascular;  Laterality: N/A;   CARDIAC VALVE REPLACEMENT     MITRAL VALVE REPLACEMENT  04/11/1998   St. Jude mechanical MVR (severe MR, episode of streptococcal bacterial endocarditis - Dr. Dusty   MITRAL VALVE REPLACEMENT N/A 07/25/2014   Procedure: REDO MITRAL VALVE REPLACEMENT (MVR);   Surgeon: Sudie VEAR Dusty, MD;  Location: [facility] OR;  Service: Open Heart Surgery;  Laterality: N/A;   TOTAL ABDOMINAL HYSTERECTOMY W/ BILATERAL SALPINGOOPHORECTOMY  12/14/1998   TRANSTHORACIC ECHOCARDIOGRAM  11/2009   EF=>55%; bi-leaflet St. Jude mech prosthesis; mild TR, RVSP elevated at 40-9mmHg, mod pulm HTN; trace AV regurg; mild pulm valve regurg   TUBAL LIGATION      Social History   Socioeconomic History   Marital status: Divorced    Spouse name: Not on file   Number of children: 3   Years of education: 14   Highest education level: Associate degree: academic program  Occupational History   Occupation: Retired  Tobacco Use   Smoking status: Former    Current packs/day: 0.00    Types: Cigarettes    Quit date: 01/17/1973    Years since quitting: 50.0   Smokeless tobacco: Never  Vaping Use   Vaping status: Never Used  Substance and Sexual Activity   Alcohol  use: No   Drug use: No   Sexual activity: Not  Currently  Other Topics Concern   Not on file  Social History Narrative   [facility] (03/27/16):   Originally from [place]. Lives with her daughter in a townhouse. Patient did get lost while driving around this winter and is no longer allowed to drive. No pets currently. No bird or mold exposure.       Has 3 children   Some college education (trade school)    Worked as systems developer with [employer]    Social Drivers of Health   Financial Resource Strain: Low Risk  (09/02/2022)   Overall Financial Resource Strain (CARDIA)    Difficulty of Paying Living Expenses: Not hard at all  Food Insecurity: No Food Insecurity (09/02/2022)   Hunger Vital Sign    Worried About Running Out of Food in the Last Year: Never true    Ran Out of Food in the Last Year: Never true  Transportation Needs: No Transportation Needs (09/02/2022)   PRAPARE - Administrator, Civil Service (Medical): No    Lack of Transportation (Non-Medical): No  Physical Activity: Insufficiently  Active (09/02/2022)   Exercise Vital Sign    Days of Exercise per Week: 3 days    Minutes of Exercise per Session: 10 min  Stress: No Stress Concern Present (09/02/2022)   [employer] of Occupational Health - Occupational Stress Questionnaire    Feeling of Stress : Not at all  Social Connections: Socially Isolated (09/02/2022)   Social Connection and Isolation Panel [NHANES]    Frequency of Communication with Friends and Family: Never    Frequency of Social Gatherings with Friends and Family: Never    Attends Religious Services: Never    Database Administrator or Organizations: No    Attends Banker Meetings: Never    Marital Status: Divorced  Catering Manager Violence: Patient Unable To Answer (09/02/2022)   Humiliation, Afraid, Rape, and Kick questionnaire    Fear of Current or Ex-Partner: Patient unable to answer    Emotionally Abused: Patient unable to answer    Physically Abused: Patient unable to answer    Sexually Abused: Patient unable to answer    Family History  Problem Relation Age of Onset   Lung cancer Mother    Heart attack Mother    Heart failure Maternal Grandmother    Heart failure Maternal Grandfather    Stroke Maternal Grandfather    Stroke Father    Colon cancer Father    Diabetes Father    Stroke Sister    Lung disease Neg Hx      Review of Systems  Constitutional: Negative.  Negative for chills and fever.  HENT:  Positive for congestion. Negative for sore throat.   Respiratory: Negative.  Negative for cough and shortness of breath.   Cardiovascular: Negative.  Negative for chest pain and palpitations.  Gastrointestinal:  Negative for abdominal pain, blood in stool, diarrhea, nausea and vomiting.  Genitourinary: Negative.  Negative for dysuria and hematuria.  Skin: Negative.  Negative for rash.  Neurological: Negative.  Negative for dizziness and headaches.  All other systems reviewed and are negative.   Vitals:   01/20/23 1530   BP: 120/72  Pulse: 80  SpO2: 95%    Physical Exam Vitals reviewed.  Constitutional:      Appearance: Normal appearance.  HENT:     Head: Normocephalic.     Mouth/Throat:     Mouth: Mucous membranes are moist.     Pharynx: Oropharynx is clear.  Eyes:     Extraocular Movements: Extraocular movements intact.     Pupils: Pupils are equal, round, and reactive to light.  Neck:     Comments: Slightly tender mass to right submandibular area Cardiovascular:     Rate and Rhythm: Normal rate and regular rhythm.     Pulses: Normal pulses.     Heart sounds: Normal heart sounds.  Pulmonary:     Effort: Pulmonary effort is normal.     Breath sounds: Normal breath sounds.  Abdominal:     Palpations: Abdomen is soft.     Tenderness: There is no abdominal tenderness.  Musculoskeletal:     Cervical back: No tenderness.  Lymphadenopathy:     Head:     Right side of head: Submental and submandibular adenopathy present.  Neurological:     General: No focal deficit present.     Mental Status: She is alert. Mental status is at baseline.  Psychiatric:        Behavior: Behavior normal.      ASSESSMENT & PLAN: A total of 44 minutes was spent with the patient and counseling/coordination of care regarding preparing for this visit, review of most recent office visit notes, review of multiple chronic medical conditions under management, review of all medications, finding of right submandibular mass and need for workup including soft tissue ultrasound and blood work, recommendation to start antibiotic, prognosis, documentation and need for follow-up.  Problem List Items Addressed This Visit       Respiratory   Chest congestion   Chronic.  Not coughing.  Afebrile. Will get chest x-ray today      Relevant Medications   clindamycin  (CLEOCIN ) 150 MG capsule   Other Relevant Orders   CBC with Differential/Platelet   Comprehensive metabolic panel   DG Chest 2 View   US  Soft Tissue Head/Neck  (NON-THYROID )     Immune and Lymphatic   Submandibular lymphadenopathy - Primary   Concerning for malignancy. Differential diagnosis discussed with daughter Recommend soft tissue ultrasound Recommend to start antibiotic Patient on anticoagulation with warfarin Start clindamycin  150 mg 3 times a day for 7 days      Relevant Medications   clindamycin  (CLEOCIN ) 150 MG capsule   Other Relevant Orders   CBC with Differential/Platelet   Comprehensive metabolic panel   DG Chest 2 View   US  Soft Tissue Head/Neck (NON-THYROID )   Patient Instructions  Lymphadenopathy  Lymphadenopathy is when your lymph glands are swollen or larger than normal.  Lymph glands, also called lymph nodes, are clumps of tissue. They filter germs and waste from tissues in your body to your bloodstream. They're part of your body's defense system, or immune system. Lymphadenopathy has different causes, like infection, autoimmune disease, and cancer. Lymphadenopathy can happen wherever you have lymph nodes. The type you have depends on which nodes it's in, such as: Cervical lymphadenopathy. This is in the neck. Mediastinal lymphadenopathy. This is in the chest. Hilar lymphadenopathy. This is in the lungs. Axillary lymphadenopathy. This is in the armpits. Inguinal lymphadenopathy. This is in the groin. Sometimes, fluid and cells that fight infection build up in your lymph nodes. This happens when your immune system reacts to germs or other substances that get into your body. This makes lymph nodes swell and get bigger. Treatment is based on what's thought to be the cause. Sometimes, lymph nodes don't go back to normal size after treatment. If yours don't, your health care provider may order tests to help learn why your  glands are still swollen and big. Follow these instructions at home:  Take over-the-counter and prescription medicines only as told by your provider. If you were prescribed antibiotics, do not stop using  them, even if you start to feel better. If told, apply heat to swollen lymph nodes as told by your provider. Use the heat source that your provider recommends, such as a moist heat pack or a heating pad. Place a towel between your skin and the heat source. Leave the heat on only for the time told by your provider to avoid injury. If your skin turns bright red, remove the heat right away to prevent burns. The risk of burns is higher if you cannot feel pain, heat, or cold. Check your swollen lymph nodes every day for changes. Check other places where you have lymph nodes as told. Check for changes such as: More swelling. Sudden growth in size. Redness or pain. Hardness. Contact a health care provider if: You have lymph nodes that: Are still swollen after 2 weeks. Have gotten bigger all of a sudden or the swelling spreads. Are red, painful, or hard. Fluid leaks from the skin near a swollen lymph node. You get a fever, chills, or night sweats. You feel tired. You have a sore throat. Your abdomen hurts. You lose weight without trying. This information is not intended to replace advice given to you by your health care provider. Make sure you discuss any questions you have with your health care provider. Document Revised: 03/20/2022 Document Reviewed: 03/20/2022 Elsevier Patient Education  2024 [employer] Inc[employer]    Emil Schaumann, MD [facility]

## 2023-01-20 NOTE — Assessment & Plan Note (Signed)
 Chronic.  Not coughing.  Afebrile. Will get chest x-ray today

## 2023-01-20 NOTE — Telephone Encounter (Signed)
 Copied from CRM [id number]. Topic: General - Other >> Jan 20, 2023 11:44 AM [place] A wrote: Reason for CRM: Patients' daughter [name] called and said she could be reached on her cell phone [phone] instead of her office number

## 2023-01-20 NOTE — Telephone Encounter (Signed)
 Chief Complaint: New/worsening swelling Symptoms: BLE swelling, swelling in neck Frequency: Ongoing since Friday Pertinent Negatives: Patient denies SOB, CP, fever Disposition: [] ED /[] Urgent Care (no appt availability in office) / [x] Appointment(In office/virtual)/ []  [facility]/ [] Home Care/ [] Refused Recommended Disposition /[] [facility]/ []  Follow-up with PCP Additional Notes: Pt's daughter Annabella reports that roughly 2 weeks ago she noticed her mother's left breast was very swollen, she noted that when she was with her mother Friday the swelling seemed to have subsided but she notes worsening pitting edema with hard lumpy areas on occasion to her lower legs. She also notes pt has a swollen lump on her chin/neck. She denies any SOB or difficulty breathing. Reports pt does take a water  pill but she does not give it to her every day as she is not always able to stay with her during the day. OV scheduled for tomorrow AM, visit for 01/20 canceled. This RN educated pt's daughter on new-worsening symptoms, when to call back/seek emergent care. Pt's dtr verbalized understanding and agrees to plan.    Copied from CRM [id number]. Topic: Clinical - Pink Word Triage >> Jan 20, 2023  8:49 AM Burnard DEL wrote: Reason for Triage:  daughter called in stating that her mom had swelling in breast 2 weeks ago, that has went away.now she  has lump a  in throat andswelling in legs that she is concerned about [phone] daughter tiffany Reason for Disposition  [1] MODERATE leg swelling (e.g., swelling extends up to knees) AND [2] new-onset or worsening  Answer Assessment - Initial Assessment Questions 1. ONSET: When did the swelling start? (e.g., minutes, hours, days)     Friday 2. LOCATION: What part of the leg is swollen?  Are both legs swollen or just one leg?     Bilateral LE, left breast swelling about 2 weeks ago, throat/chin swelling this AM 3. SEVERITY: How bad is the  swelling? (e.g., localized; mild, moderate, severe)   - Localized: Small area of swelling localized to one leg.   - MILD pedal edema: Swelling limited to foot and ankle, pitting edema < 1/4 inch (6 mm) deep, rest and elevation eliminate most or all swelling.   - MODERATE edema: Swelling of lower leg to knee, pitting edema > 1/4 inch (6 mm) deep, rest and elevation only partially reduce swelling.   - SEVERE edema: Swelling extends above knee, facial or hand swelling present.      Pitting edema, lumpy and hard' 4. REDNESS: Does the swelling look red or infected?     Red lines down her legs 5. PAIN: Is the swelling painful to touch? If Yes, ask: How painful is it?   (Scale 1-10; mild, moderate or severe)     Yes 6. FEVER: Do you have a fever? If Yes, ask: What is it, how was it measured, and when did it start?      None 7. CAUSE: What do you think is causing the leg swelling?     Unknown 8. MEDICAL HISTORY: Do you have a history of blood clots (e.g., DVT), cancer, heart failure, kidney disease, or liver failure?     HF 9. RECURRENT SYMPTOM: Have you had leg swelling before? If Yes, ask: When was the last time? What happened that time?     Yes 10. OTHER SYMPTOMS: Do you have any other symptoms? (e.g., chest pain, difficulty breathing)       Congestion, dry cough, heavy breathing but not SOB  Protocols used: Leg  Swelling and Edema-A-AH

## 2023-01-20 NOTE — Assessment & Plan Note (Signed)
 Concerning for malignancy. Differential diagnosis discussed with daughter Recommend soft tissue ultrasound Recommend to start antibiotic Patient on anticoagulation with warfarin Start clindamycin 150 mg 3 times a day for 7 days

## 2023-01-21 ENCOUNTER — Ambulatory Visit: Payer: 59 | Admitting: Emergency Medicine

## 2023-01-21 ENCOUNTER — Ambulatory Visit
Admission: RE | Admit: 2023-01-21 | Discharge: 2023-01-21 | Disposition: A | Discharge status: Home / Self Care | Payer: 59 | Source: Ambulatory Visit | Attending: Emergency Medicine | Admitting: Emergency Medicine

## 2023-01-21 ENCOUNTER — Other Ambulatory Visit: Payer: 59

## 2023-01-21 ENCOUNTER — Ambulatory Visit: Payer: 59 | Admitting: Internal Medicine

## 2023-01-21 DIAGNOSIS — R0989 Other specified symptoms and signs involving the circulatory and respiratory systems: Secondary | ICD-10-CM

## 2023-01-21 DIAGNOSIS — R590 Localized enlarged lymph nodes: Secondary | ICD-10-CM

## 2023-01-21 DIAGNOSIS — R221 Localized swelling, mass and lump, neck: Secondary | ICD-10-CM | POA: Diagnosis not present

## 2023-01-21 LAB — CBC WITH DIFFERENTIAL/PLATELET
Basophils Relative: 0 % (ref 0.0–3.0)
Eosinophils Relative: 0 % (ref 0.0–5.0)
HCT: 34.9 % — ABNORMAL LOW (ref 36.0–46.0)
Hemoglobin: 10.6 g/dL — ABNORMAL LOW (ref 12.0–15.0)
Lymphocytes Relative: 1 % — ABNORMAL LOW (ref 12.0–46.0)
MCHC: 30.3 g/dL (ref 30.0–36.0)
MCV: 77.8 fL — ABNORMAL LOW (ref 78.0–100.0)
Monocytes Relative: 5 % (ref 3.0–12.0)
Neutrophils Relative %: 94 % — ABNORMAL HIGH (ref 43.0–77.0)
Platelets: 286 10*3/uL (ref 150.0–400.0)
RBC: 4.49 Mil/uL (ref 3.87–5.11)
RDW: 22.4 % — ABNORMAL HIGH (ref 11.5–15.5)
WBC: 12.9 10*3/uL — ABNORMAL HIGH (ref 4.0–10.5)

## 2023-01-23 ENCOUNTER — Other Ambulatory Visit (HOSPITAL_COMMUNITY): Payer: Self-pay

## 2023-01-23 ENCOUNTER — Other Ambulatory Visit: Payer: Self-pay | Admitting: Nurse Practitioner

## 2023-01-23 DIAGNOSIS — R9389 Abnormal findings on diagnostic imaging of other specified body structures: Secondary | ICD-10-CM

## 2023-01-23 DIAGNOSIS — R590 Localized enlarged lymph nodes: Secondary | ICD-10-CM

## 2023-01-23 MED ORDER — DOXYCYCLINE HYCLATE 100 MG PO TABS
100.0000 mg | ORAL_TABLET | Freq: Two times a day (BID) | ORAL | 0 refills | Status: DC
  Filled 2023-01-23: qty 20, 10d supply, fill #0

## 2023-01-24 ENCOUNTER — Telehealth: Payer: Self-pay

## 2023-01-24 ENCOUNTER — Other Ambulatory Visit (HOSPITAL_COMMUNITY): Payer: Self-pay

## 2023-01-24 NOTE — Telephone Encounter (Signed)
I spoke to patient's daughter [name]) who stated that patient will be starting Doxycycline so we reduced Coumadin to 0.5 tablet Daily while on medication.  She will check INR 01/27/23

## 2023-01-25 ENCOUNTER — Other Ambulatory Visit (HOSPITAL_COMMUNITY): Payer: Self-pay

## 2023-01-26 LAB — POCT INR: INR: 7.2 — AB (ref 2.0–3.0)

## 2023-01-27 ENCOUNTER — Ambulatory Visit: Payer: 59 | Admitting: Internal Medicine

## 2023-01-27 ENCOUNTER — Ambulatory Visit (INDEPENDENT_AMBULATORY_CARE_PROVIDER_SITE_OTHER): Payer: 59 | Admitting: Cardiovascular Disease

## 2023-01-27 ENCOUNTER — Encounter: Payer: Self-pay | Admitting: Internal Medicine

## 2023-01-27 DIAGNOSIS — Z7901 Long term (current) use of anticoagulants: Secondary | ICD-10-CM | POA: Diagnosis not present

## 2023-01-27 DIAGNOSIS — Z953 Presence of xenogenic heart valve: Secondary | ICD-10-CM | POA: Diagnosis not present

## 2023-01-27 NOTE — Progress Notes (Signed)
Acknowledged in different encounter

## 2023-02-07 ENCOUNTER — Emergency Department (HOSPITAL_COMMUNITY): Payer: 59

## 2023-02-07 ENCOUNTER — Inpatient Hospital Stay (HOSPITAL_COMMUNITY)
Admission: EM | Admit: 2023-02-07 | Discharge: 2023-03-08 | DRG: 207 | Disposition: E | Payer: 59 | Attending: Internal Medicine | Admitting: Internal Medicine

## 2023-02-07 ENCOUNTER — Encounter (HOSPITAL_COMMUNITY): Payer: Self-pay | Admitting: Pharmacy Technician

## 2023-02-07 DIAGNOSIS — D631 Anemia in chronic kidney disease: Secondary | ICD-10-CM | POA: Diagnosis present

## 2023-02-07 DIAGNOSIS — R509 Fever, unspecified: Secondary | ICD-10-CM | POA: Diagnosis not present

## 2023-02-07 DIAGNOSIS — R578 Other shock: Secondary | ICD-10-CM | POA: Diagnosis not present

## 2023-02-07 DIAGNOSIS — I959 Hypotension, unspecified: Secondary | ICD-10-CM | POA: Diagnosis not present

## 2023-02-07 DIAGNOSIS — F0150 Vascular dementia without behavioral disturbance: Secondary | ICD-10-CM | POA: Diagnosis present

## 2023-02-07 DIAGNOSIS — Z515 Encounter for palliative care: Secondary | ICD-10-CM

## 2023-02-07 DIAGNOSIS — R54 Age-related physical debility: Secondary | ICD-10-CM | POA: Diagnosis present

## 2023-02-07 DIAGNOSIS — G9340 Encephalopathy, unspecified: Secondary | ICD-10-CM | POA: Diagnosis not present

## 2023-02-07 DIAGNOSIS — I82C22 Chronic embolism and thrombosis of left internal jugular vein: Secondary | ICD-10-CM | POA: Diagnosis not present

## 2023-02-07 DIAGNOSIS — E785 Hyperlipidemia, unspecified: Secondary | ICD-10-CM | POA: Diagnosis present

## 2023-02-07 DIAGNOSIS — R220 Localized swelling, mass and lump, head: Secondary | ICD-10-CM | POA: Diagnosis not present

## 2023-02-07 DIAGNOSIS — R680 Hypothermia, not associated with low environmental temperature: Secondary | ICD-10-CM | POA: Diagnosis present

## 2023-02-07 DIAGNOSIS — I5023 Acute on chronic systolic (congestive) heart failure: Secondary | ICD-10-CM | POA: Diagnosis present

## 2023-02-07 DIAGNOSIS — I499 Cardiac arrhythmia, unspecified: Secondary | ICD-10-CM | POA: Diagnosis not present

## 2023-02-07 DIAGNOSIS — G9341 Metabolic encephalopathy: Secondary | ICD-10-CM | POA: Diagnosis present

## 2023-02-07 DIAGNOSIS — B338 Other specified viral diseases: Secondary | ICD-10-CM | POA: Diagnosis not present

## 2023-02-07 DIAGNOSIS — R579 Shock, unspecified: Secondary | ICD-10-CM | POA: Diagnosis not present

## 2023-02-07 DIAGNOSIS — R627 Adult failure to thrive: Secondary | ICD-10-CM | POA: Diagnosis present

## 2023-02-07 DIAGNOSIS — Z801 Family history of malignant neoplasm of trachea, bronchus and lung: Secondary | ICD-10-CM

## 2023-02-07 DIAGNOSIS — E440 Moderate protein-calorie malnutrition: Secondary | ICD-10-CM | POA: Diagnosis present

## 2023-02-07 DIAGNOSIS — I493 Ventricular premature depolarization: Secondary | ICD-10-CM | POA: Diagnosis present

## 2023-02-07 DIAGNOSIS — Z823 Family history of stroke: Secondary | ICD-10-CM

## 2023-02-07 DIAGNOSIS — R159 Full incontinence of feces: Secondary | ICD-10-CM | POA: Diagnosis present

## 2023-02-07 DIAGNOSIS — I6502 Occlusion and stenosis of left vertebral artery: Secondary | ICD-10-CM | POA: Diagnosis not present

## 2023-02-07 DIAGNOSIS — J9601 Acute respiratory failure with hypoxia: Secondary | ICD-10-CM | POA: Diagnosis present

## 2023-02-07 DIAGNOSIS — J9602 Acute respiratory failure with hypercapnia: Secondary | ICD-10-CM | POA: Diagnosis present

## 2023-02-07 DIAGNOSIS — Z7189 Other specified counseling: Secondary | ICD-10-CM

## 2023-02-07 DIAGNOSIS — Z8672 Personal history of thrombophlebitis: Secondary | ICD-10-CM

## 2023-02-07 DIAGNOSIS — R29818 Other symptoms and signs involving the nervous system: Secondary | ICD-10-CM | POA: Diagnosis not present

## 2023-02-07 DIAGNOSIS — H518 Other specified disorders of binocular movement: Secondary | ICD-10-CM | POA: Diagnosis present

## 2023-02-07 DIAGNOSIS — J121 Respiratory syncytial virus pneumonia: Principal | ICD-10-CM | POA: Diagnosis present

## 2023-02-07 DIAGNOSIS — Z87891 Personal history of nicotine dependence: Secondary | ICD-10-CM

## 2023-02-07 DIAGNOSIS — N179 Acute kidney failure, unspecified: Secondary | ICD-10-CM | POA: Diagnosis not present

## 2023-02-07 DIAGNOSIS — E162 Hypoglycemia, unspecified: Secondary | ICD-10-CM | POA: Diagnosis present

## 2023-02-07 DIAGNOSIS — J479 Bronchiectasis, uncomplicated: Secondary | ICD-10-CM | POA: Diagnosis not present

## 2023-02-07 DIAGNOSIS — J9690 Respiratory failure, unspecified, unspecified whether with hypoxia or hypercapnia: Secondary | ICD-10-CM | POA: Diagnosis not present

## 2023-02-07 DIAGNOSIS — I6523 Occlusion and stenosis of bilateral carotid arteries: Secondary | ICD-10-CM | POA: Diagnosis not present

## 2023-02-07 DIAGNOSIS — Z66 Do not resuscitate: Secondary | ICD-10-CM | POA: Diagnosis not present

## 2023-02-07 DIAGNOSIS — I509 Heart failure, unspecified: Secondary | ICD-10-CM | POA: Diagnosis not present

## 2023-02-07 DIAGNOSIS — N1831 Chronic kidney disease, stage 3a: Secondary | ICD-10-CM | POA: Diagnosis present

## 2023-02-07 DIAGNOSIS — E860 Dehydration: Secondary | ICD-10-CM | POA: Diagnosis not present

## 2023-02-07 DIAGNOSIS — Z6825 Body mass index (BMI) 25.0-25.9, adult: Secondary | ICD-10-CM

## 2023-02-07 DIAGNOSIS — Z789 Other specified health status: Secondary | ICD-10-CM

## 2023-02-07 DIAGNOSIS — I700 Atherosclerosis of aorta: Secondary | ICD-10-CM | POA: Diagnosis not present

## 2023-02-07 DIAGNOSIS — M7989 Other specified soft tissue disorders: Secondary | ICD-10-CM | POA: Diagnosis not present

## 2023-02-07 DIAGNOSIS — Z800 Family history of malignant neoplasm of digestive organs: Secondary | ICD-10-CM

## 2023-02-07 DIAGNOSIS — R001 Bradycardia, unspecified: Secondary | ICD-10-CM | POA: Diagnosis not present

## 2023-02-07 DIAGNOSIS — R918 Other nonspecific abnormal finding of lung field: Secondary | ICD-10-CM | POA: Diagnosis not present

## 2023-02-07 DIAGNOSIS — I619 Nontraumatic intracerebral hemorrhage, unspecified: Secondary | ICD-10-CM | POA: Diagnosis not present

## 2023-02-07 DIAGNOSIS — Z8673 Personal history of transient ischemic attack (TIA), and cerebral infarction without residual deficits: Secondary | ICD-10-CM

## 2023-02-07 DIAGNOSIS — Z953 Presence of xenogenic heart valve: Secondary | ICD-10-CM

## 2023-02-07 DIAGNOSIS — D62 Acute posthemorrhagic anemia: Secondary | ICD-10-CM | POA: Diagnosis not present

## 2023-02-07 DIAGNOSIS — I808 Phlebitis and thrombophlebitis of other sites: Secondary | ICD-10-CM | POA: Diagnosis present

## 2023-02-07 DIAGNOSIS — L89152 Pressure ulcer of sacral region, stage 2: Secondary | ICD-10-CM | POA: Diagnosis present

## 2023-02-07 DIAGNOSIS — I517 Cardiomegaly: Secondary | ICD-10-CM | POA: Diagnosis not present

## 2023-02-07 DIAGNOSIS — I6381 Other cerebral infarction due to occlusion or stenosis of small artery: Secondary | ICD-10-CM | POA: Diagnosis not present

## 2023-02-07 DIAGNOSIS — R109 Unspecified abdominal pain: Secondary | ICD-10-CM | POA: Diagnosis not present

## 2023-02-07 DIAGNOSIS — J449 Chronic obstructive pulmonary disease, unspecified: Secondary | ICD-10-CM | POA: Diagnosis not present

## 2023-02-07 DIAGNOSIS — R008 Other abnormalities of heart beat: Secondary | ICD-10-CM | POA: Diagnosis present

## 2023-02-07 DIAGNOSIS — Z90710 Acquired absence of both cervix and uterus: Secondary | ICD-10-CM

## 2023-02-07 DIAGNOSIS — J9600 Acute respiratory failure, unspecified whether with hypoxia or hypercapnia: Secondary | ICD-10-CM | POA: Diagnosis not present

## 2023-02-07 DIAGNOSIS — I4891 Unspecified atrial fibrillation: Secondary | ICD-10-CM | POA: Diagnosis not present

## 2023-02-07 DIAGNOSIS — J210 Acute bronchiolitis due to respiratory syncytial virus: Secondary | ICD-10-CM | POA: Diagnosis present

## 2023-02-07 DIAGNOSIS — R6889 Other general symptoms and signs: Secondary | ICD-10-CM | POA: Diagnosis not present

## 2023-02-07 DIAGNOSIS — E039 Hypothyroidism, unspecified: Secondary | ICD-10-CM | POA: Diagnosis present

## 2023-02-07 DIAGNOSIS — E874 Mixed disorder of acid-base balance: Secondary | ICD-10-CM | POA: Diagnosis present

## 2023-02-07 DIAGNOSIS — I6201 Nontraumatic acute subdural hemorrhage: Secondary | ICD-10-CM | POA: Diagnosis not present

## 2023-02-07 DIAGNOSIS — R569 Unspecified convulsions: Secondary | ICD-10-CM | POA: Diagnosis not present

## 2023-02-07 DIAGNOSIS — R739 Hyperglycemia, unspecified: Secondary | ICD-10-CM | POA: Diagnosis not present

## 2023-02-07 DIAGNOSIS — Z833 Family history of diabetes mellitus: Secondary | ICD-10-CM

## 2023-02-07 DIAGNOSIS — Z79899 Other long term (current) drug therapy: Secondary | ICD-10-CM

## 2023-02-07 DIAGNOSIS — S065XAA Traumatic subdural hemorrhage with loss of consciousness status unknown, initial encounter: Secondary | ICD-10-CM | POA: Diagnosis present

## 2023-02-07 DIAGNOSIS — S065X0A Traumatic subdural hemorrhage without loss of consciousness, initial encounter: Secondary | ICD-10-CM | POA: Diagnosis not present

## 2023-02-07 DIAGNOSIS — Z9911 Dependence on respirator [ventilator] status: Secondary | ICD-10-CM

## 2023-02-07 DIAGNOSIS — I639 Cerebral infarction, unspecified: Secondary | ICD-10-CM | POA: Diagnosis not present

## 2023-02-07 DIAGNOSIS — Z8679 Personal history of other diseases of the circulatory system: Secondary | ICD-10-CM

## 2023-02-07 DIAGNOSIS — I6200 Nontraumatic subdural hemorrhage, unspecified: Secondary | ICD-10-CM | POA: Diagnosis not present

## 2023-02-07 DIAGNOSIS — Z7901 Long term (current) use of anticoagulants: Secondary | ICD-10-CM

## 2023-02-07 DIAGNOSIS — R29810 Facial weakness: Secondary | ICD-10-CM | POA: Diagnosis present

## 2023-02-07 DIAGNOSIS — D689 Coagulation defect, unspecified: Secondary | ICD-10-CM | POA: Diagnosis present

## 2023-02-07 DIAGNOSIS — Z4682 Encounter for fitting and adjustment of non-vascular catheter: Secondary | ICD-10-CM | POA: Diagnosis not present

## 2023-02-07 DIAGNOSIS — J984 Other disorders of lung: Secondary | ICD-10-CM | POA: Diagnosis not present

## 2023-02-07 DIAGNOSIS — T45511A Poisoning by anticoagulants, accidental (unintentional), initial encounter: Secondary | ICD-10-CM | POA: Diagnosis not present

## 2023-02-07 DIAGNOSIS — R570 Cardiogenic shock: Secondary | ICD-10-CM | POA: Diagnosis not present

## 2023-02-07 DIAGNOSIS — R0600 Dyspnea, unspecified: Secondary | ICD-10-CM | POA: Diagnosis not present

## 2023-02-07 DIAGNOSIS — G40901 Epilepsy, unspecified, not intractable, with status epilepticus: Secondary | ICD-10-CM

## 2023-02-07 DIAGNOSIS — R571 Hypovolemic shock: Secondary | ICD-10-CM | POA: Diagnosis not present

## 2023-02-07 DIAGNOSIS — I6782 Cerebral ischemia: Secondary | ICD-10-CM | POA: Diagnosis not present

## 2023-02-07 DIAGNOSIS — F05 Delirium due to known physiological condition: Secondary | ICD-10-CM | POA: Diagnosis not present

## 2023-02-07 DIAGNOSIS — T45515A Adverse effect of anticoagulants, initial encounter: Secondary | ICD-10-CM | POA: Diagnosis present

## 2023-02-07 DIAGNOSIS — K5900 Constipation, unspecified: Secondary | ICD-10-CM | POA: Diagnosis not present

## 2023-02-07 DIAGNOSIS — I130 Hypertensive heart and chronic kidney disease with heart failure and stage 1 through stage 4 chronic kidney disease, or unspecified chronic kidney disease: Secondary | ICD-10-CM | POA: Diagnosis present

## 2023-02-07 DIAGNOSIS — R404 Transient alteration of awareness: Secondary | ICD-10-CM | POA: Diagnosis not present

## 2023-02-07 DIAGNOSIS — Z8249 Family history of ischemic heart disease and other diseases of the circulatory system: Secondary | ICD-10-CM

## 2023-02-07 DIAGNOSIS — J690 Pneumonitis due to inhalation of food and vomit: Secondary | ICD-10-CM | POA: Diagnosis not present

## 2023-02-07 DIAGNOSIS — I480 Paroxysmal atrial fibrillation: Secondary | ICD-10-CM | POA: Diagnosis not present

## 2023-02-07 DIAGNOSIS — G253 Myoclonus: Secondary | ICD-10-CM | POA: Diagnosis present

## 2023-02-07 DIAGNOSIS — R4189 Other symptoms and signs involving cognitive functions and awareness: Secondary | ICD-10-CM | POA: Diagnosis present

## 2023-02-07 DIAGNOSIS — G8194 Hemiplegia, unspecified affecting left nondominant side: Secondary | ICD-10-CM | POA: Diagnosis not present

## 2023-02-07 DIAGNOSIS — R4701 Aphasia: Secondary | ICD-10-CM | POA: Diagnosis present

## 2023-02-07 DIAGNOSIS — I050 Rheumatic mitral stenosis: Secondary | ICD-10-CM | POA: Diagnosis present

## 2023-02-07 DIAGNOSIS — D6959 Other secondary thrombocytopenia: Secondary | ICD-10-CM | POA: Diagnosis present

## 2023-02-07 DIAGNOSIS — J811 Chronic pulmonary edema: Secondary | ICD-10-CM | POA: Diagnosis not present

## 2023-02-07 DIAGNOSIS — J90 Pleural effusion, not elsewhere classified: Secondary | ICD-10-CM | POA: Diagnosis not present

## 2023-02-07 DIAGNOSIS — I4510 Unspecified right bundle-branch block: Secondary | ICD-10-CM | POA: Diagnosis not present

## 2023-02-07 DIAGNOSIS — R0602 Shortness of breath: Secondary | ICD-10-CM | POA: Diagnosis not present

## 2023-02-07 DIAGNOSIS — I6203 Nontraumatic chronic subdural hemorrhage: Secondary | ICD-10-CM | POA: Diagnosis not present

## 2023-02-07 DIAGNOSIS — G9389 Other specified disorders of brain: Secondary | ICD-10-CM | POA: Diagnosis not present

## 2023-02-07 DIAGNOSIS — Z452 Encounter for adjustment and management of vascular access device: Secondary | ICD-10-CM | POA: Diagnosis not present

## 2023-02-07 DIAGNOSIS — R0609 Other forms of dyspnea: Secondary | ICD-10-CM | POA: Diagnosis not present

## 2023-02-07 DIAGNOSIS — R1310 Dysphagia, unspecified: Secondary | ICD-10-CM | POA: Diagnosis present

## 2023-02-07 DIAGNOSIS — I10 Essential (primary) hypertension: Secondary | ICD-10-CM | POA: Diagnosis not present

## 2023-02-07 DIAGNOSIS — K219 Gastro-esophageal reflux disease without esophagitis: Secondary | ICD-10-CM | POA: Diagnosis present

## 2023-02-07 DIAGNOSIS — B974 Respiratory syncytial virus as the cause of diseases classified elsewhere: Secondary | ICD-10-CM | POA: Diagnosis not present

## 2023-02-07 DIAGNOSIS — I5022 Chronic systolic (congestive) heart failure: Secondary | ICD-10-CM | POA: Diagnosis not present

## 2023-02-07 DIAGNOSIS — R4182 Altered mental status, unspecified: Principal | ICD-10-CM

## 2023-02-07 DIAGNOSIS — Z743 Need for continuous supervision: Secondary | ICD-10-CM | POA: Diagnosis not present

## 2023-02-07 DIAGNOSIS — L89899 Pressure ulcer of other site, unspecified stage: Secondary | ICD-10-CM | POA: Diagnosis present

## 2023-02-07 DIAGNOSIS — Z7982 Long term (current) use of aspirin: Secondary | ICD-10-CM

## 2023-02-07 DIAGNOSIS — E875 Hyperkalemia: Secondary | ICD-10-CM | POA: Diagnosis not present

## 2023-02-07 DIAGNOSIS — Y92009 Unspecified place in unspecified non-institutional (private) residence as the place of occurrence of the external cause: Secondary | ICD-10-CM

## 2023-02-07 DIAGNOSIS — E46 Unspecified protein-calorie malnutrition: Secondary | ICD-10-CM | POA: Diagnosis not present

## 2023-02-07 DIAGNOSIS — N1832 Chronic kidney disease, stage 3b: Secondary | ICD-10-CM | POA: Diagnosis not present

## 2023-02-07 DIAGNOSIS — J189 Pneumonia, unspecified organism: Secondary | ICD-10-CM | POA: Diagnosis not present

## 2023-02-07 DIAGNOSIS — I651 Occlusion and stenosis of basilar artery: Secondary | ICD-10-CM | POA: Diagnosis not present

## 2023-02-07 DIAGNOSIS — R29737 NIHSS score 37: Secondary | ICD-10-CM | POA: Diagnosis present

## 2023-02-07 DIAGNOSIS — I672 Cerebral atherosclerosis: Secondary | ICD-10-CM | POA: Diagnosis not present

## 2023-02-07 DIAGNOSIS — G319 Degenerative disease of nervous system, unspecified: Secondary | ICD-10-CM | POA: Diagnosis not present

## 2023-02-07 LAB — URINALYSIS, ROUTINE W REFLEX MICROSCOPIC
Bilirubin Urine: NEGATIVE
Glucose, UA: NEGATIVE mg/dL
Ketones, ur: NEGATIVE mg/dL
Leukocytes,Ua: NEGATIVE
Nitrite: NEGATIVE
Protein, ur: 100 mg/dL — AB
Specific Gravity, Urine: 1.031 — ABNORMAL HIGH (ref 1.005–1.030)
pH: 5 (ref 5.0–8.0)

## 2023-02-07 LAB — DIFFERENTIAL
Abs Immature Granulocytes: 0.02 10*3/uL (ref 0.00–0.07)
Basophils Absolute: 0 10*3/uL (ref 0.0–0.1)
Basophils Relative: 0 %
Eosinophils Absolute: 0 10*3/uL (ref 0.0–0.5)
Eosinophils Relative: 0 %
Immature Granulocytes: 0 %
Lymphocytes Relative: 5 %
Lymphs Abs: 0.3 10*3/uL — ABNORMAL LOW (ref 0.7–4.0)
Monocytes Absolute: 0.8 10*3/uL (ref 0.1–1.0)
Monocytes Relative: 12 %
Neutro Abs: 5.6 10*3/uL (ref 1.7–7.7)
Neutrophils Relative %: 83 %

## 2023-02-07 LAB — I-STAT CHEM 8, ED
BUN: 24 mg/dL — ABNORMAL HIGH (ref 8–23)
Calcium, Ion: 1.41 mmol/L — ABNORMAL HIGH (ref 1.15–1.40)
Chloride: 113 mmol/L — ABNORMAL HIGH (ref 98–111)
Creatinine, Ser: 1.7 mg/dL — ABNORMAL HIGH (ref 0.44–1.00)
Glucose, Bld: 128 mg/dL — ABNORMAL HIGH (ref 70–99)
HCT: 31 % — ABNORMAL LOW (ref 36.0–46.0)
Hemoglobin: 10.5 g/dL — ABNORMAL LOW (ref 12.0–15.0)
Potassium: 4.6 mmol/L (ref 3.5–5.1)
Sodium: 145 mmol/L (ref 135–145)
TCO2: 28 mmol/L (ref 22–32)

## 2023-02-07 LAB — I-STAT ARTERIAL BLOOD GAS, ED
Acid-base deficit: 1 mmol/L (ref 0.0–2.0)
Bicarbonate: 24.2 mmol/L (ref 20.0–28.0)
Calcium, Ion: 1.37 mmol/L (ref 1.15–1.40)
HCT: 27 % — ABNORMAL LOW (ref 36.0–46.0)
Hemoglobin: 9.2 g/dL — ABNORMAL LOW (ref 12.0–15.0)
O2 Saturation: 100 %
Patient temperature: 90
Potassium: 4 mmol/L (ref 3.5–5.1)
Sodium: 145 mmol/L (ref 135–145)
TCO2: 25 mmol/L (ref 22–32)
pCO2 arterial: 32.6 mm[Hg] (ref 32–48)
pH, Arterial: 7.457 — ABNORMAL HIGH (ref 7.35–7.45)
pO2, Arterial: 220 mm[Hg] — ABNORMAL HIGH (ref 83–108)

## 2023-02-07 LAB — COMPREHENSIVE METABOLIC PANEL
ALT: 21 U/L (ref 0–44)
AST: 25 U/L (ref 15–41)
Albumin: 2.7 g/dL — ABNORMAL LOW (ref 3.5–5.0)
Alkaline Phosphatase: 110 U/L (ref 38–126)
Anion gap: 9 (ref 5–15)
BUN: 22 mg/dL (ref 8–23)
CO2: 23 mmol/L (ref 22–32)
Calcium: 9.5 mg/dL (ref 8.9–10.3)
Chloride: 113 mmol/L — ABNORMAL HIGH (ref 98–111)
Creatinine, Ser: 1.94 mg/dL — ABNORMAL HIGH (ref 0.44–1.00)
GFR, Estimated: 27 mL/min — ABNORMAL LOW (ref >60)
Glucose, Bld: 131 mg/dL — ABNORMAL HIGH (ref 70–99)
Potassium: 4.6 mmol/L (ref 3.5–5.1)
Sodium: 145 mmol/L (ref 135–145)
Total Bilirubin: 1.6 mg/dL — ABNORMAL HIGH (ref 0.0–1.2)
Total Protein: 6.5 g/dL (ref 6.5–8.1)

## 2023-02-07 LAB — RAPID URINE DRUG SCREEN, HOSP PERFORMED
Amphetamines: NOT DETECTED
Barbiturates: NOT DETECTED
Benzodiazepines: NOT DETECTED
Cocaine: NOT DETECTED
Opiates: NOT DETECTED
Tetrahydrocannabinol: NOT DETECTED

## 2023-02-07 LAB — CBC
HCT: 32 % — ABNORMAL LOW (ref 36.0–46.0)
Hemoglobin: 9.2 g/dL — ABNORMAL LOW (ref 12.0–15.0)
MCH: 23.4 pg — ABNORMAL LOW (ref 26.0–34.0)
MCHC: 28.8 g/dL — ABNORMAL LOW (ref 30.0–36.0)
MCV: 81.2 fL (ref 80.0–100.0)
Platelets: 120 10*3/uL — ABNORMAL LOW (ref 150–400)
RBC: 3.94 MIL/uL (ref 3.87–5.11)
RDW: 23.5 % — ABNORMAL HIGH (ref 11.5–15.5)
WBC: 6.8 10*3/uL (ref 4.0–10.5)
nRBC: 1.6 % — ABNORMAL HIGH (ref 0.0–0.2)

## 2023-02-07 LAB — I-STAT CG4 LACTIC ACID, ED: Lactic Acid, Venous: 1.9 mmol/L (ref 0.5–1.9)

## 2023-02-07 LAB — PROTIME-INR
INR: 8.3 (ref 0.8–1.2)
INR: 8.3 (ref 0.8–1.2)
INR: 1.7 — ABNORMAL HIGH (ref 0.8–1.2)
Prothrombin Time: 69.1 s — ABNORMAL HIGH (ref 11.4–15.2)
Prothrombin Time: 69.1 s — ABNORMAL HIGH (ref 11.4–15.2)
Prothrombin Time: 20.5 s — ABNORMAL HIGH (ref 11.4–15.2)

## 2023-02-07 LAB — APTT: aPTT: 89 s — ABNORMAL HIGH (ref 24–36)

## 2023-02-07 LAB — TSH: TSH: 6.265 u[IU]/mL — ABNORMAL HIGH (ref 0.350–4.500)

## 2023-02-07 LAB — CK: Total CK: 92 U/L (ref 38–234)

## 2023-02-07 LAB — CBG MONITORING, ED: Glucose-Capillary: 126 mg/dL — ABNORMAL HIGH (ref 70–99)

## 2023-02-07 LAB — ETHANOL: Alcohol, Ethyl (B): <10 mg/dL (ref <10)

## 2023-02-07 MED ORDER — METRONIDAZOLE 500 MG/100ML IV SOLN
500.0000 mg | Freq: Once | INTRAVENOUS | Status: AC
  Administered 2023-02-07: 500 mg via INTRAVENOUS
  Filled 2023-02-07: qty 100

## 2023-02-07 MED ORDER — SODIUM CHLORIDE 0.9 % IV SOLN: 250.0000 mL | INTRAVENOUS | Status: AC

## 2023-02-07 MED ORDER — LORAZEPAM 2 MG/ML IJ SOLN
INTRAMUSCULAR | Status: AC
  Filled 2023-02-07: qty 2

## 2023-02-07 MED ORDER — ETOMIDATE 2 MG/ML IV SOLN
INTRAVENOUS | Status: AC | PRN
  Administered 2023-02-07: 15 mg via INTRAVENOUS

## 2023-02-07 MED ORDER — LACTATED RINGERS IV SOLN: INTRAVENOUS | Status: DC

## 2023-02-07 MED ORDER — FENTANYL BOLUS VIA INFUSION: 25.0000 ug | INTRAVENOUS | Status: DC | PRN

## 2023-02-07 MED ORDER — DOCUSATE SODIUM 50 MG/5ML PO LIQD: 100.0000 mg | Freq: Two times a day (BID) | ORAL | Status: DC | PRN

## 2023-02-07 MED ORDER — FAMOTIDINE 20 MG PO TABS: 20.0000 mg | ORAL_TABLET | Freq: Two times a day (BID) | ORAL | Status: DC

## 2023-02-07 MED ORDER — VITAMIN K1 10 MG/ML IJ SOLN
10.0000 mg | INTRAMUSCULAR | Status: AC
  Administered 2023-02-07: 10 mg via INTRAVENOUS
  Filled 2023-02-07: qty 1

## 2023-02-07 MED ORDER — IOHEXOL 350 MG/ML SOLN
75.0000 mL | Freq: Once | INTRAVENOUS | Status: AC | PRN
  Administered 2023-02-07: 75 mL via INTRAVENOUS

## 2023-02-07 MED ORDER — VANCOMYCIN HCL IN DEXTROSE 1-5 GM/200ML-% IV SOLN
1000.0000 mg | Freq: Once | INTRAVENOUS | Status: AC
  Administered 2023-02-08: 1000 mg via INTRAVENOUS
  Filled 2023-02-07: qty 200

## 2023-02-07 MED ORDER — DEXTROSE IN LACTATED RINGERS 5 % IV SOLN: INTRAVENOUS | Status: DC

## 2023-02-07 MED ORDER — FENTANYL CITRATE PF 50 MCG/ML IJ SOSY: 25.0000 ug | PREFILLED_SYRINGE | Freq: Once | INTRAMUSCULAR | Status: DC

## 2023-02-07 MED ORDER — NOREPINEPHRINE 4 MG/250ML-% IV SOLN
INTRAVENOUS | Status: AC
  Filled 2023-02-07: qty 250

## 2023-02-07 MED ORDER — ROCURONIUM BROMIDE 10 MG/ML (PF) SYRINGE
PREFILLED_SYRINGE | INTRAVENOUS | Status: AC | PRN
  Administered 2023-02-07: 70 mg via INTRAVENOUS

## 2023-02-07 MED ORDER — POLYETHYLENE GLYCOL 3350 17 G PO PACK: 17.0000 g | PACK | Freq: Every day | ORAL | Status: DC | PRN

## 2023-02-07 MED ORDER — LORAZEPAM 2 MG/ML IJ SOLN
4.0000 mg | Freq: Once | INTRAMUSCULAR | Status: AC
  Administered 2023-02-07: 4 mg via INTRAVENOUS

## 2023-02-07 MED ORDER — NOREPINEPHRINE 4 MG/250ML-% IV SOLN
2.0000 ug/min | INTRAVENOUS | Status: DC
  Administered 2023-02-07: 2 ug/min via INTRAVENOUS
  Administered 2023-02-08 (×3): 9 ug/min via INTRAVENOUS
  Administered 2023-02-09: 10 ug/min via INTRAVENOUS
  Administered 2023-02-09: 8 ug/min via INTRAVENOUS
  Filled 2023-02-07 (×5): qty 250

## 2023-02-07 MED ORDER — PROTHROMBIN COMPLEX CONC HUMAN 500 UNITS IV KIT
2042.0000 [IU] | PACK | Status: AC
  Administered 2023-02-07: 2042 [IU] via INTRAVENOUS
  Filled 2023-02-07: qty 2042

## 2023-02-07 MED ORDER — LEVETIRACETAM 500 MG/5ML IV SOLN
3500.0000 mg | Freq: Once | INTRAVENOUS | Status: AC
  Administered 2023-02-07: 3500 mg via INTRAVENOUS
  Filled 2023-02-07: qty 35

## 2023-02-07 MED ORDER — LEVETIRACETAM IN NACL 500 MG/100ML IV SOLN
500.0000 mg | Freq: Two times a day (BID) | INTRAVENOUS | Status: DC
  Administered 2023-02-08 – 2023-02-22 (×30): 500 mg via INTRAVENOUS
  Filled 2023-02-07 (×30): qty 100

## 2023-02-07 MED ORDER — PANTOPRAZOLE SODIUM 40 MG IV SOLR
40.0000 mg | Freq: Every day | INTRAVENOUS | Status: DC
  Administered 2023-02-08 – 2023-02-16 (×10): 40 mg via INTRAVENOUS
  Filled 2023-02-07 (×10): qty 10

## 2023-02-07 MED ORDER — SODIUM CHLORIDE 0.9 % IV SOLN
2.0000 g | Freq: Once | INTRAVENOUS | Status: AC
  Administered 2023-02-07: 2 g via INTRAVENOUS
  Filled 2023-02-07: qty 12.5

## 2023-02-07 MED ORDER — FENTANYL 2500MCG IN NS 250ML (10MCG/ML) PREMIX INFUSION
25.0000 ug/h | INTRAVENOUS | Status: DC
  Administered 2023-02-07: 25 ug/h via INTRAVENOUS
  Filled 2023-02-07: qty 250

## 2023-02-07 NOTE — Progress Notes (Signed)
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   I would favor not to reverse the coumadin due to acute basilar occlusion and stroke concern and transition the patient to a IV heparin drip if needed and get repeat CT brain in short interval for stability purposes.

## 2023-02-07 NOTE — ED Notes (Addendum)
External pacing restarted per Leotis Shames, PA. Rate 70, 14mA.

## 2023-02-07 NOTE — Progress Notes (Signed)
   02/07/23 2047  Spiritual Encounters  Type of Visit Initial  Care provided to: [facility] present during encounter Nurse  Referral source Other (comment)  Reason for visit Routine spiritual support  OnCall Visit Yes   Spoke with family and escorted to patient room. Further chaplain assistance declined.

## 2023-02-07 NOTE — Progress Notes (Signed)
Informed about low blood pressure  Will start peripheral Levophed

## 2023-02-07 NOTE — Consult Note (Signed)
NEUROLOGY CONSULT NOTE   Date of service: February 07, 2023 Patient Name: [name] MRN:  [id number] DOB:  Jun 30, 1945 Chief Complaint: "Stroke code" Requesting Provider: Tomma Lightning, MD  History of Present Illness  [name] is a 78 y.o. female with hx of Htn, mitral valve replacement, Afibb on warfarin, prior hx of endocarditis who was found unresponsive by her daughter.  Last known well around 0730 this AM. Daughter returned from work to find her unresponsive. Called EMS, her pupils were pinpoint, she had little movement on the right and none on the left, she had R forced gaze deviation. She had to be bagged enroute, bardycardic and hypotensive. She had to be stabilized and intubated and given Ativan prior to imaging.  CT Head with mixed density R subdural with no substantial mass effect. 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 However, this was also noted on prior MRA head from 2017. Limited MRI Brain with no acute stroke.  LKW: 0730 Modified rankin score: 3-Moderate disability-requires help but walks WITHOUT assistance IV Thrombolysis: not offered, given no acute stroke and outside window and on warfarin with INR of 8 EVT: not offered, noted basilar and L vertebral occlusion is chronic.  NIHSS components Score: Comment  1a Level of Conscious 0[]  1[]  2[]  3[x]      1b LOC Questions 0[]  1[]  2[x]       1c LOC Commands 0[]  1[]  2[x]       2 Best Gaze 0[]  1[]  2[x]       3 Visual 0[]  1[]  2[]  3[x]      4 Facial Palsy 0[]  1[]  2[x]  3[]      5a Motor Arm - left 0[]  1[]  2[]  3[]  4[x]  UN[]    5b Motor Arm - Right 0[]  1[]  2[]  3[]  4[x]  UN[]    6a Motor Leg - Left 0[]  1[]  2[]  3[]  4[x]  UN[]    6b Motor Leg - Right 0[]  1[]  2[]  3[]  4[x]  UN[]    7 Limb Ataxia 0[x]  1[]  2[]  3[]  UN[]     8 Sensory 0[]  1[]  2[x]  UN[]      9 Best Language 0[]  1[]  2[]  3[x]      10 Dysarthria 0[]  1[]  2[x]  UN[]      11 Extinct. and Inattention 0[x]  1[]  2[]       TOTAL: 37       ROS  Unable to ascertain due to intubation and sedation.  Past History   Past Medical History:  Diagnosis Date   Acute CHF (congestive heart failure) (HCC) 12/14/2014   Hyperlipidemia    Hypertension    Prosthetic valve dysfunction 07/22/2014   Acute mitral stenosis due to restricted leaflet mobility of bileaflet mechanical mitral valve prosthesis   S/P MVR (mitral valve replacement) 04/11/1998   #29 St. 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   Valvular endocarditis 2000   Streptococcus    Past Surgical History:  Procedure Laterality Date   ABDOMINAL HYSTERECTOMY     CARDIAC CATHETERIZATION N/A 07/24/2014   Procedure: Right/Left Heart Cath and Coronary Angiography;  Surgeon: Iran Ouch, MD;  Location: [facility] INVASIVE CV LAB;  Service: Cardiovascular;  Laterality: N/A;   CARDIAC CATHETERIZATION N/A 07/22/2014   Procedure: Fluoroscopy Guidance;  Surgeon: Lennette Bihari, MD;  Location: [facility] INVASIVE CV LAB;  Service: Cardiovascular;  Laterality: N/A;  CARDIAC CATHETERIZATION N/A 12/16/2014   Procedure: Right Heart Cath;  Surgeon: Laurey Morale, MD;  Location: [facility] INVASIVE CV LAB;  Service: Cardiovascular;  Laterality: N/A;   CARDIAC VALVE REPLACEMENT     MITRAL VALVE REPLACEMENT  04/11/1998   St. Jude mechanical MVR (severe MR, episode of streptococcal bacterial endocarditis - Dr. Cornelius Moras   MITRAL VALVE REPLACEMENT N/A 07/25/2014   Procedure: REDO MITRAL VALVE REPLACEMENT (MVR);  Surgeon: Purcell Nails, MD;  Location: [facility] OR;  Service: Open Heart Surgery;  Laterality: N/A;   TOTAL ABDOMINAL HYSTERECTOMY W/ BILATERAL SALPINGOOPHORECTOMY  12/14/1998   TRANSTHORACIC ECHOCARDIOGRAM  11/2009   EF=>55%; bi-leaflet St. Jude mech prosthesis; mild TR, RVSP elevated at 40-27mmHg,  mod pulm HTN; trace AV regurg; mild pulm valve regurg   TUBAL LIGATION      Family History: Family History  Problem Relation Age of Onset   Lung cancer Mother    Heart attack Mother    Heart failure Maternal Grandmother    Heart failure Maternal Grandfather    Stroke Maternal Grandfather    Stroke Father    Colon cancer Father    Diabetes Father    Stroke Sister    Lung disease Neg Hx     Social History  reports that she quit smoking about 50 years ago. Her smoking use included cigarettes. She has never used smokeless tobacco. She reports that she does not drink alcohol and does not use drugs.  Allergies  Allergen Reactions   Augmentin [Amoxicillin-Pot Clavulanate] Other (See Comments)    Unknown reaction Has patient had a PCN reaction causing immediate rash, facial/tongue/throat swelling, SOB or lightheadedness with hypotension Has patient had a PCN reaction causing severe rash involving mucus membranes or skin necrosis Has patient had a PCN reaction that required hospitalization  Has patient had a PCN reaction occurring within the last 10 years] If all of the above answers are "NO", then may proceed with Cephalosporin use.     Medications   Current Facility-Administered Medications:    ceFEPIme (MAXIPIME) 2 g in sodium chloride 0.9 % 100 mL IVPB, 2 g, Intravenous, Once, Benjiman Core, MD, Last Rate: 200 mL/hr at 02/07/23 2138, 2 g at 02/07/23 2138   dextrose 5 % in lactated ringers infusion, , Intravenous, Continuous, Olalere, Adewale A, MD   docusate (COLACE) 50 MG/5ML liquid 100 mg, 100 mg, Per Tube, BID PRN, Wynona Neat, Adewale A, MD   etomidate (AMIDATE) injection, , Intravenous, Code/Trauma/Sedation Med, Benjiman Core, MD, 15 mg at 02/07/23 1855   fentaNYL (SUBLIMAZE) bolus via infusion 25-100 mcg, 25-100 mcg, Intravenous, Q15 min PRN, Benjiman Core, MD   fentaNYL (SUBLIMAZE) injection 25 mcg, 25 mcg, Intravenous, Once, Benjiman Core, MD   fentaNYL  in NS (31mcg/ml) infusion-PREMIX, 25-200 mcg/hr, Intravenous, Continuous, Benjiman Core, MD, Last Rate: 2.5 mL/hr at 02/07/23 2144, 25 mcg/hr at 02/07/23 2144   lactated ringers infusion, , Intravenous, Continuous, Benjiman Core, MD, Last Rate: 150 mL/hr at 02/07/23 2138, New Bag at 02/07/23 2138   metroNIDAZOLE (FLAGYL) IVPB 500 mg, 500 mg, Intravenous, Once, Benjiman Core, MD   pantoprazole (PROTONIX) injection 40 mg, 40 mg, Intravenous, QHS, Olalere, Adewale A, MD   polyethylene glycol (MIRALAX / GLYCOLAX) packet 17 g, 17 g, Per Tube, Daily PRN, Wynona Neat, Adewale A, MD   rocuronium (ZEMURON) injection, , Intravenous, Code/Trauma/Sedation Med, Benjiman Core, MD, 70 mg at 02/07/23 1855   vancomycin (VANCOCIN) IVPB 1000 mg/200 mL premix, 1,000 mg, Intravenous, Once, Benjiman Core, MD  Current Outpatient  Medications:    acetaminophen (TYLENOL) 325 MG tablet, Take 2 tablets (650 mg total) by mouth every 6 (six) hours as needed (pain, fever, headache, body aches)., Disp: 30 tablet, Rfl: 0   allopurinol (ZYLOPRIM) 100 MG tablet, Take 1 tablet (100 mg total) by mouth daily. Take in addition to 300 mg for total daily dose of 400 mg allopurinol., Disp: 90 tablet, Rfl: 3   allopurinol (ZYLOPRIM) 300 MG tablet, Take 1 tablet (300 mg total) by mouth daily., Disp: 90 tablet, Rfl: 3   aspirin EC 81 MG tablet, Take 1 tablet (81 mg total) by mouth daily., Disp: 30 tablet, Rfl: 0   atorvastatin (LIPITOR) 40 MG tablet, Take 1 tablet (40 mg total) by mouth daily., Disp: 90 tablet, Rfl: 0   carvedilol (COREG) 3.125 MG tablet, Take 1 tablet (3.125 mg total) by mouth 2 (two) times daily with a meal., Disp: 180 tablet, Rfl: 3   donepezil (ARICEPT) 10 MG tablet, Take 1 tablet (10 mg total) by mouth at bedtime., Disp: 90 tablet, Rfl: 3   doxycycline (VIBRA-TABS) 100 MG tablet, Take 1 tablet (100 mg total) by mouth 2 (two) times daily., Disp: 20 tablet, Rfl: 0   Elastic Bandages & Supports  (MEDICAL COMPRESSION STOCKINGS) MISC, Use daily 10-20 mmHg, Disp: 2 each, Rfl: 3   furosemide (LASIX) 20 MG tablet, TAKE 1 TABLET BY MOUTH ONCE A DAY (KEEP APPT), Disp: 90 tablet, Rfl: 2   hydrALAZINE (APRESOLINE) 50 MG tablet, Take 1 and 1/2 tablets (75 mg total) by mouth 3 times daily., Disp: 135 tablet, Rfl: 3   isosorbide mononitrate (IMDUR) 30 MG 24 hr tablet, Take 1 tablet (30 mg total) by mouth daily., Disp: 90 tablet, Rfl: 2   ondansetron (ZOFRAN) 4 MG tablet, Take 1 tablet (4 mg total) by mouth every 6 (six) hours., Disp: 12 tablet, Rfl: 0   pantoprazole (PROTONIX) 40 MG tablet, Take 1 tablet (40 mg total) by mouth daily., Disp: 90 tablet, Rfl: 2   potassium chloride SA (KLOR-CON M) 20 MEQ tablet, Take 1 tablet (20 mEq total) by mouth daily., Disp: 90 tablet, Rfl: 3   predniSONE (DELTASONE) 20 MG tablet, Take 2 tablets (40 mg total) by mouth daily with breakfast for 5 days, Disp: 10 tablet, Rfl: 4   warfarin (COUMADIN) 2 MG tablet, TAKE 1 TO 1 & 1/2 TABLETS BY MOUTH DAILY OR AS DIRECTED BY COUMADIN CLINIC, Disp: 150 tablet, Rfl: 0  Vitals   Vitals:   02/07/23 2120 02/07/23 2125 02/07/23 2130 02/07/23 2135  BP: 132/71 108/62 97/65 101/74  Pulse: (!) 43 (!) 41 (!) 38 (!) 38  Resp: 20 15 17 16   Temp:      TempSrc:      SpO2: 100% 100% 100% 100%  Weight:        Body mass index is 22.71 kg/m.  Physical Exam   General: appears frail and old, laying comfortably in bed; in no acute distress.  HENT: Normal oropharynx and mucosa. Normal external appearance of ears and nose. ETT in place. Neck: Supple, no pain or tenderness  CV: No JVD. No peripheral edema.  Pulmonary: Symmetric Chest rise. Not breathing over vent Abdomen: Soft to touch, non-tender.  Ext: No cyanosis, edema, or deformity  Skin: No rash. Normal palpation of skin.   Musculoskeletal: Normal digits and nails by inspection. No clubbing.   Neurologic Examination  Mental status/Cognition: obtunded, no response to voice  or loud clap. Speech/language: mute, no speech Cranial nerves:   CN  II Pupils 2mm, sluggish reaction to light.   CN III,IV,VI Dolls eyes reflex is absent. No gaze preference or deviation.   CN V Corneals absent   CN VII Facial diplegia.   CN VIII Does not turn head towards speech   CN IX & X No cough, no gag.   CN XI Head midline   CN XII midline tongue but does not protrude on command.   Sensory/Motor:  Muscle bulk: poor, tone flaccid in all extremities. No movements noted in any of the extremities. No respons to proximal pinch in any of the extremities. However, I evaluated her shortly after intubation.  Coordination/Complex Motor:  - Unable to assess.  Labs/Imaging/Neurodiagnostic studies   CBC:  Recent Labs  Lab 2023-02-10 1844 Feb 10, 2023 1853 02-10-23 2027  WBC 6.8  --   --   NEUTROABS 5.6  --   --   HGB 9.2* 10.5* 9.2*  HCT 32.0* 31.0* 27.0*  MCV 81.2  --   --   PLT 120*  --   --    Basic Metabolic Panel:  Lab Results  Component Value Date   NA 145 02-10-23   K 4.0 02-10-23   CO2 23 2023/02/10   GLUCOSE 128 (H) 2023-02-10   BUN 24 (H) 02-10-2023   CREATININE 1.70 (H) 10-Feb-2023   CALCIUM 9.5 February 10, 2023   GFRNONAA 27 (L) Feb 10, 2023   GFRAA 42 (L) 03/14/2015   Lipid Panel:  Lab Results  Component Value Date   LDLCALC 85 08/24/2021   HgbA1c:  Lab Results  Component Value Date   HGBA1C 5.5 05/17/2016   Urine Drug Screen:     Component Value Date/Time   LABOPIA NONE DETECTED 02/21/2015 2222   COCAINSCRNUR NONE DETECTED 02/21/2015 2222   LABBENZ NONE DETECTED 02/21/2015 2222   AMPHETMU NONE DETECTED 02/21/2015 2222   THCU NONE DETECTED 02/21/2015 2222   LABBARB NONE DETECTED 02/21/2015 2222    Alcohol Level     Component Value Date/Time   ETH <10 10-Feb-2023 1844   INR  Lab Results  Component Value Date   INR 8.3 (HH) February 10, 2023   APTT  Lab Results  Component Value Date   APTT 89 (H) Feb 10, 2023   AED levels: No results found for:  "PHENYTOIN", "ZONISAMIDE", "LAMOTRIGINE", "LEVETIRACETA"  CT Head without contrast(Personally reviewed): Mixed density R SDH, no significant mass effect  CT angio Head and Neck with contrast(Personally reviewed): Basilar artery occlusion along with L vertebral artery occlusion.   MRI Brain(Personally reviewed): No acute stroke on limited MRI Brain.  Neurodiagnostics cEEG:  pending  ASSESSMENT   [name] is a 78 y.o. female with hx of Htn, mitral valve replacement, Afibb on warfarin, prior hx of endocarditis who was found unresponsive by her daughter. Brougt in bradycardic, hypotensive with R gaze deviation, left sided weakness and obtunded mentation and intubated. She was found to have mixed density right SDH with no significant mass effect. CTA showed a basilar occlusion with L vert occlusion but review of prior MRA head from 2017 shows that this is know and stable, furthermore MRI Brain does not show an acute stroke in the posterior circulation.  She is not a candidate for tnkase due to outside window, SDH, warfarin with INR of 8.3. She is not a candidate for thrombectomy due to noted basilar and left vert occlusion being chronic and stable with no acute strokes noted on limited MRIs.  Etiology of her noted R gaze and L sided weakness I suspect is likely seizure/status  epilepticus in the setting of Right SDH.  RECOMMENDATIONS  - cEEG - Ativan 4mg , loaded with Keppra 60mg /Kg - continue Keppra 500mg  BID. - Management of SDH, including blood pressure goal and consideration of reversal of warfarin per neurosurgery. - management of bradycardia per primary team. ______________________________________________________________________  Plan discussed with patient's daughter Ms. [name] over phone. Plan also disussed with ED team and with PCCM team.  This patient is critically ill and at significant risk of neurological worsening, death and care requires constant  monitoring of vital signs, hemodynamics,respiratory and cardiac monitoring, neurological assessment, discussion with family, other specialists and medical decision making of high complexity. I spent 105 minutes of neurocritical care time  in the care of  this patient. This was time spent independent of any time provided by nurse practitioner or PA.  Erick Blinks [facility] 02/07/2023  10:24 PM   Signed, Erick Blinks, MD [facility]

## 2023-02-07 NOTE — Code Documentation (Addendum)
Responded to Code Stroke called at 1827 for unresponsiveness, [id number]. Pt arrived at 1842. Pt was being bagged and externally paced on arrival. Pt was taken to ED room and intubated. NIH done by day shift RR RN was 33 prior to intubation Night shift RR RN met day shift RR RN in CT. CT head-R SDH, remote R cerebellar and R basal ganglia perforator infarct. Pt was given 4mg  ativan IV prior to CTA d/t concern for seizure. CTA-occlusion of basilar artery, occlusion of the L vertebral artery proximally in he neck with poor distal opacification. After turning pacer off per EDP order and monitoring pt's VS, it was determined to be safe to leave pacer off and pt was transported to MRI. MRI-no evidence of acute infarct, R SDH.  TNK not given-SDH, no infarct. Pt not a candidate for IR intervention. Plan: Kcentra/vit k, ICU admission, seizure workup. Please complete VS/neuro checks q2h x 12, then q4h, or more often if per unit protocol.

## 2023-02-07 NOTE — ED Notes (Addendum)
Patient transported to MRI with primary RN, rapid RN, neuro MD, and pharmacy.

## 2023-02-07 NOTE — ED Notes (Signed)
 Critical care at bedside

## 2023-02-07 NOTE — ED Provider Notes (Signed)
[place] EMERGENCY DEPARTMENT AT [facility] Provider Note   CSN: [id number] Arrival date & time: 02/07/23  1842  An emergency department physician performed an initial assessment on this suspected stroke patient at 1843.  History  Chief Complaint  Patient presents with   Code Stroke    [name] is a 78 y.o. female.  HPI Patient presented as a code stroke.  Met by myself and Dr. Selina Cooley [facility] [place].  Reportedly last normal at 730 this morning.  Unresponsive.  Was bradycardic and hypotensive and was paced.  Also reported had facial droop.  Pinpoint pupils.  No respond to Narcan.  Brought to room for intubation due to decreased mental status.    Home Medications Prior to Admission medications   Medication Sig Start Date End Date Taking? Authorizing Provider  acetaminophen (TYLENOL) 325 MG tablet Take 2 tablets (650 mg total) by mouth every 6 (six) hours as needed (pain, fever, headache, body aches). 09/09/20   Edwin Dada P, DO  allopurinol (ZYLOPRIM) 100 MG tablet Take 1 tablet (100 mg total) by mouth daily. Take in addition to 300 mg for total daily dose of 400 mg allopurinol. 09/30/22   Myrlene Broker, MD  allopurinol (ZYLOPRIM) 300 MG tablet Take 1 tablet (300 mg total) by mouth daily. 01/14/22   Myrlene Broker, MD  aspirin EC 81 MG tablet Take 1 tablet (81 mg total) by mouth daily. 02/24/15   Jerald Kief, MD  atorvastatin (LIPITOR) 40 MG tablet Take 1 tablet (40 mg total) by mouth daily. 11/18/22   Myrlene Broker, MD  carvedilol (COREG) 3.125 MG tablet Take 1 tablet (3.125 mg total) by mouth 2 (two) times daily with a meal. 08/12/22 08/12/23  Lennette Bihari, MD  donepezil (ARICEPT) 10 MG tablet Take 1 tablet (10 mg total) by mouth at bedtime. 01/14/22   Myrlene Broker, MD  doxycycline (VIBRA-TABS) 100 MG tablet Take 1 tablet (100 mg total) by mouth 2 (two) times daily. 01/23/23   Elenore Paddy, NP  Elastic Bandages &  Supports (MEDICAL COMPRESSION STOCKINGS) MISC Use daily 10-20 mmHg 03/25/22   Myrlene Broker, MD  furosemide (LASIX) 20 MG tablet TAKE 1 TABLET BY MOUTH ONCE A DAY (KEEP APPT) 11/26/22 11/26/23  Chrystie Nose, MD  hydrALAZINE (APRESOLINE) 50 MG tablet Take 1 and 1/2 tablets (75 mg total) by mouth 3 times daily. 07/01/22   Myrlene Broker, MD  isosorbide mononitrate (IMDUR) 30 MG 24 hr tablet Take 1 tablet (30 mg total) by mouth daily. 10/01/22   Hilty, Lisette Abu, MD  ondansetron (ZOFRAN) 4 MG tablet Take 1 tablet (4 mg total) by mouth every 6 (six) hours. 09/09/20   Edwin Dada P, DO  pantoprazole (PROTONIX) 40 MG tablet Take 1 tablet (40 mg total) by mouth daily. 10/01/22   Hilty, Lisette Abu, MD  potassium chloride SA (KLOR-CON M) 20 MEQ tablet Take 1 tablet (20 mEq total) by mouth daily. 08/27/22   Myrlene Broker, MD  predniSONE (DELTASONE) 20 MG tablet Take 2 tablets (40 mg total) by mouth daily with breakfast for 5 days 12/30/22   Myrlene Broker, MD  warfarin (COUMADIN) 2 MG tablet TAKE 1 TO 1 & 1/2 TABLETS BY MOUTH DAILY OR AS DIRECTED BY COUMADIN CLINIC 08/12/22 11/15/22  Hilty, Lisette Abu, MD      Allergies    Augmentin [amoxicillin-pot clavulanate]    Review of Systems   Review of  Systems  Physical Exam Updated Vital Signs BP 108/70   Pulse (!) 43   Temp (!) 85.1 F (29.5 C) (Rectal)   Resp 17   Wt 60 kg Comment: estimated weight based on previous chart wt  LMP  (LMP Unknown)   SpO2 100%   BMI 22.71 kg/m  Physical Exam Vitals and nursing note reviewed.  Eyes:     Comments: Pupils pinpoint.  Cardiovascular:     Rate and Rhythm: Bradycardia present.  Chest:     Chest wall: No tenderness.  Abdominal:     Tenderness: There is no abdominal tenderness.  Neurological:     Comments: Complete NIH scoring done by neurology.  But briefly decreased mental status.  Nonverbal.  No commands.  Did have some spontaneous movement of right leg and potentially left arm  with pain.     ED Results / Procedures / Treatments   Labs (all labs ordered are listed, but only abnormal results are displayed) Labs Reviewed  CBC - Abnormal; Notable for the following components:      Result Value   Hemoglobin 9.2 (*)    HCT 32.0 (*)    MCH 23.4 (*)    MCHC 28.8 (*)    RDW 23.5 (*)    Platelets 120 (*)    nRBC 1.6 (*)    All other components within normal limits  DIFFERENTIAL - Abnormal; Notable for the following components:   Lymphs Abs 0.3 (*)    All other components within normal limits  COMPREHENSIVE METABOLIC PANEL - Abnormal; Notable for the following components:   Chloride 113 (*)    Glucose, Bld 131 (*)    Creatinine, Ser 1.94 (*)    Albumin 2.7 (*)    Total Bilirubin 1.6 (*)    GFR, Estimated 27 (*)    All other components within normal limits  URINALYSIS, ROUTINE W REFLEX MICROSCOPIC - Abnormal; Notable for the following components:   Color, Urine AMBER (*)    APPearance HAZY (*)    Specific Gravity, Urine 1.031 (*)    Hgb urine dipstick SMALL (*)    Protein, ur 100 (*)    Bacteria, UA RARE (*)    All other components within normal limits  PROTIME-INR - Abnormal; Notable for the following components:   Prothrombin Time 69.1 (*)    INR 8.3 (*)    All other components within normal limits  APTT - Abnormal; Notable for the following components:   aPTT 89 (*)    All other components within normal limits  PROTIME-INR - Abnormal; Notable for the following components:   Prothrombin Time 69.1 (*)    INR 8.3 (*)    All other components within normal limits  TSH - Abnormal; Notable for the following components:   TSH 6.265 (*)    All other components within normal limits  I-STAT CHEM 8, ED - Abnormal; Notable for the following components:   Chloride 113 (*)    BUN 24 (*)    Creatinine, Ser 1.70 (*)    Glucose, Bld 128 (*)    Calcium, Ion 1.41 (*)    Hemoglobin 10.5 (*)    HCT 31.0 (*)    All other components within normal limits  CBG  MONITORING, ED - Abnormal; Notable for the following components:   Glucose-Capillary 126 (*)    All other components within normal limits  I-STAT ARTERIAL BLOOD GAS, ED - Abnormal; Notable for the following components:   pH, Arterial 7.457 (*)  pO2, Arterial 220 (*)    HCT 27.0 (*)    Hemoglobin 9.2 (*)    All other components within normal limits  CULTURE, BLOOD (ROUTINE X 2)  CULTURE, BLOOD (ROUTINE X 2)  CULTURE, BLOOD (SINGLE)  ETHANOL  RAPID URINE DRUG SCREEN, HOSP PERFORMED  CK  BLOOD GAS, ARTERIAL  TSH  PROTIME-INR  BLOOD GAS, ARTERIAL  CBC  BASIC METABOLIC PANEL  I-STAT CG4 LACTIC ACID, ED  I-STAT CG4 LACTIC ACID, ED    EKG None  Radiology DG Chest Portable 1 View Result Date: 02/07/2023 CLINICAL DATA:  Evaluate ET tube placement EXAM: PORTABLE CHEST 1 VIEW COMPARISON:  01/20/2023 FINDINGS: ETT tip is 3.6 cm above the carina. There is an enteric tube with tip in the stomach. Stable cardiac enlargement. Bilateral pleural effusions appear increased from previous exam. Decreased aeration to both lung bases concerning for atelectasis and/or consolidative change. IMPRESSION: 1. Satisfactory position of ETT and enteric tube. 2. Increased bilateral pleural effusions 3. Decreased aeration to both lung bases compatible with atelectasis and or consolidation. Electronically Signed   By: Signa Kell M.D.   On: 02/07/2023 21:45   DG Abd Portable 1V Result Date: 02/07/2023 CLINICAL DATA:  Abdominal pain. EXAM: PORTABLE ABDOMEN - 1 VIEW COMPARISON:  None Available. FINDINGS: The bowel gas pattern is unremarkable. No findings for obstruction or perforation. The soft tissue shadows are grossly maintained. No worrisome calcifications. Moderate vascular calcifications. The bony structures are unremarkable. IMPRESSION: Unremarkable abdominal radiograph. Electronically Signed   By: Rudie Meyer M.D.   On: 02/07/2023 21:45   MR BRAIN WO CONTRAST Result Date: 02/07/2023 EXAM: MRI HEAD  WITHOUT CONTRAST TECHNIQUE: Multiplanar, multiecho pulse sequences of the brain and surrounding structures were obtained without intravenous contrast. COMPARISON:  Same day CT head. FINDINGS: Limited protocol performed with only DWI/ADC, axial FLAIR and sagittal T1 performed. Within this limitation: Similar size of a right subdural hematoma when comparing across modalities to same day CT head. No substantial mass effect or midline shift. No evidence of acute infarct. Multiple remote infarcts including remote infarcts in the cerebellum and right basal ganglia. Cerebral atrophy. IMPRESSION: 1. Limited MRI without evidence of acute infarct. 2. Right subdural hemorrhage appears similar when comparing across modalities. Findings discussed with Dr. Derry Lory via telephone at 8:03 p.m. Electronically Signed   By: Feliberto Harts M.D.   On: 02/07/2023 20:37   CT ANGIO HEAD NECK W WO CM (CODE STROKE) Result Date: 02/07/2023 CLINICAL DATA:  Neuro deficit, acute, stroke suspected EXAM: CT ANGIOGRAPHY HEAD AND NECK WITH AND WITHOUT CONTRAST TECHNIQUE: Multidetector CT imaging of the head and neck was performed using the standard protocol during bolus administration of intravenous contrast. Multiplanar CT image reconstructions and MIPs were obtained to evaluate the vascular anatomy. Carotid stenosis measurements (when applicable) are obtained utilizing NASCET criteria, using the distal internal carotid diameter as the denominator. RADIATION DOSE REDUCTION: This exam was performed according to the departmental dose-optimization program which includes automated exposure control, adjustment of the mA and/or kV according to patient size and/or use of iterative reconstruction technique. CONTRAST:  75mL OMNIPAQUE IOHEXOL 350 MG/ML SOLN COMPARISON:  Same day CT head. FINDINGS: CTA NECK FINDINGS Aortic arch: Aortic atherosclerosis. Great vessel origins are patent. Right carotid system: Atherosclerosis at the carotid bifurcation  without greater than 50% stenosis. Left carotid system: Atherosclerosis at the carotid bifurcation without greater than 50% stenosis. Vertebral arteries: Occlusion of the left vertebral artery proximally with poor distal opacification. Right vertebral artery is patent without significant  stenosis. Skeleton: No acute abnormality on limited assessment. Other neck: No acute abnormality on limited assessment. Mild generalized soft tissue edema. Upper chest: Moderate layering right pleural effusion. Emphysema. Mosaic attenuation likely related to expiratory imaging. 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 Otherwise, Bilateral intracranial ICAs, MCAs, and ACAs are patent without proximal high-grade stenosis. Posterior circulation: Bilateral intradural vertebral arteries are patent. Severe left intradural vertebral artery stenosis proximally. Occlusion of the basilar artery. Minimal left PCA opacification via a small posterior communicating artery and more robust opacification of right PCA from a smaller posterior communicating artery. Venous sinuses: Not well assessed due to arterial timing. Review of the MIP images confirms the above findings IMPRESSION: 1. Occlusion of the basilar artery. Minimal left PCA opacification and more robust right PCA opacification via posterior communicating arteries. 2. Occlusion of the left vertebral artery proximally in the neck with poor distal opacification. Superimposed severe left intradural vertebral artery stenosis. 3. Moderate right pleural effusion. 4. Aortic Atherosclerosis (ICD10-I70.0) and Emphysema (ICD10-J43.9). Imaging results were communicated on 02/07/2023 at 7:18 pm to provider Dr. Derry Lory via telephone, who verbally acknowledged these results. Electronically Signed   By: Feliberto Harts M.D.   On: 02/07/2023 19:26   CT HEAD CODE STROKE WO CONTRAST Result Date:  02/07/2023 CLINICAL DATA:  Code stroke.  Neuro deficit, acute, stroke suspected EXAM: CT HEAD WITHOUT CONTRAST TECHNIQUE: Contiguous axial images were obtained from the base of the skull through the vertex without intravenous contrast. RADIATION DOSE REDUCTION: This exam was performed according to the departmental dose-optimization program which includes automated exposure control, adjustment of the mA and/or kV according to patient size and/or use of iterative reconstruction technique. COMPARISON:  CT head November 23, 2021. FINDINGS: Brain: New mixed density right subdural hemorrhage measuring up to 5 mm. No substantial mass effect or midline shift given cerebral atrophy. Remote right cerebellar infarct. Remote perforator infarct in the right basal ganglia. No evidence of acute/new large vascular territory infarct. No hydrocephalus. Vascular: Calcific atherosclerosis. No hyperdense vessel identified. Skull: No acute fracture. Sinuses/Orbits: Mild paranasal sinus mucosal thickening. No acute orbital findings. Other: No mastoid effusions. ASPECTS [facility] Stroke Program Early CT Score) total score (0-10 with 10 being normal): 10. IMPRESSION: 1. New mixed density right subdural hemorrhage measuring up to 5 mm areas of internal hyperdensity are compatible with acute/recent hemorrhage. No substantial mass effect given cerebral atrophy. 2. 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 No evidence of acute large vascular territory infarct. Code stroke imaging results were communicated on 02/07/2023 at 7:15 pm to provider Dr. Derry Lory via telephone, who verbally acknowledged these results. Electronically Signed   By: Feliberto Harts M.D.   On: 02/07/2023 19:16    Procedures Procedure Name: Intubation Date/Time: 02/07/2023 8:00 PM  Performed by: Benjiman Core, MDPre-anesthesia Checklist: Emergency Drugs available Oxygen Delivery Method: Non-rebreather mask Preoxygenation:  Pre-oxygenation with 100% oxygen Induction Type: Rapid sequence Ventilation: Mask ventilation without difficulty Laryngoscope Size: Glidescope Grade View: Grade II Tube size: 7.5 mm Number of attempts: 1 Placement Confirmation: ETT inserted through vocal cords under direct vision, CO2 detector and Breath sounds checked- equal and bilateral Secured at: 21 cm Tube secured with: ETT holder Dental Injury: Teeth and Oropharynx as per pre-operative assessment         Medications Ordered in ED Medications  etomidate (AMIDATE) injection (15 mg Intravenous Given 02/07/23 1855)  rocuronium (ZEMURON) injection (70 mg Intravenous Given 02/07/23 1855)  fentaNYL (SUBLIMAZE) injection 25 mcg (0 mcg Intravenous Hold 02/07/23 2124)  fentaNYL in NS (57mcg/ml) infusion-PREMIX (25 mcg/hr Intravenous New Bag/Given 02/07/23 2144)  fentaNYL (SUBLIMAZE) bolus via infusion 25-100 mcg (has no administration in time range)  lactated ringers infusion ( Intravenous New Bag/Given 02/07/23 2138)  metroNIDAZOLE (FLAGYL) IVPB 500 mg (has no administration in time range)  vancomycin (VANCOCIN) IVPB 1000 mg/200 mL premix (has no administration in time range)  docusate (COLACE) 50 MG/5ML liquid 100 mg (has no administration in time range)  polyethylene glycol (MIRALAX / GLYCOLAX) packet 17 g (has no administration in time range)  pantoprazole (PROTONIX) injection 40 mg (has no administration in time range)  dextrose 5 % in lactated ringers infusion (has no administration in time range)  0.9 %  sodium chloride infusion (has no administration in time range)  norepinephrine (LEVOPHED) 4mg  in (0.016 mg/mL) premix infusion (has no administration in time range)  levETIRAcetam (KEPPRA) IVPB 500 mg/100 mL premix (has no administration in time range)  norepinephrine (LEVOPHED) 4-5 MG/250ML-% infusion SOLN (has no administration in time range)  levETIRAcetam (KEPPRA) 3,500 mg in sodium chloride 0.9 % 250 mL IVPB  (0 mg Intravenous Stopped 02/07/23 2125)  iohexol (OMNIPAQUE) 350 MG/ML injection 75 mL (75 mLs Intravenous Contrast Given 02/07/23 1916)  LORazepam (ATIVAN) injection 4 mg (4 mg Intravenous Given 02/07/23 1915)  ceFEPIme (MAXIPIME) 2 g in sodium chloride 0.9 % 100 mL IVPB (0 g Intravenous Stopped 02/07/23 2208)  prothrombin complex conc human (KCENTRA) IVPB 2,042 Units (0 Units Intravenous Stopped 02/07/23 2208)  phytonadione (VITAMIN K) 10 mg in dextrose 5 % 50 mL IVPB (0 mg Intravenous Stopped 02/07/23 2208)    ED Course/ Medical Decision Making/ A&P                                 Medical Decision Making Amount and/or Complexity of Data Reviewed Labs: ordered. Radiology: ordered.  Risk Prescription drug management. Decision regarding hospitalization.   Patient with mental status change.  Brought in as code stroke due to potential lateralizing symptoms.  Also paced.  Bradycardic.  Differential diagnoses long but includes cause such as seizure, stroke, bleed.  Particularly since patients INR was 7 a couple weeks ago.  Initial head CT however did show no bleed. CTA done and did show basilar occlusion however.  Taken MRI to further evaluate if interventional candidate.  Discussed with ICU.  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  Was bradycardic.  ICU attempted pacing again.  Was hypothermic.  Antibiotics started.  Found that her INR was elevated above 8.  Discussed with Dr. Jake Samples.  Initially no plan for anticoagulation with thought of basilar occlusion but now that is old will give [place].  CRITICAL CARE Performed by: Benjiman Core Total critical care time: 30 minutes Critical care time was exclusive of separately billable procedures and treating other patients. Critical care was necessary to treat or prevent imminent or life-threatening deterioration. Critical care was time spent personally by me on the following activities: development of treatment plan  with patient and/or surrogate as well as nursing, discussions with consultants, evaluation of patient's response to treatment, examination of patient, obtaining history from patient or surrogate, ordering and performing treatments and interventions, ordering and review of laboratory studies, ordering and review of radiographic studies, pulse oximetry and re-evaluation of patient's condition.           Final  Clinical Impression(s) / ED Diagnoses Final diagnoses:  Altered mental status, unspecified altered mental status type  Subdural hematoma (HCC)  Warfarin toxicity, accidental or unintentional, initial encounter    Rx / DC Orders ED Discharge Orders     None         Benjiman Core, MD 02/07/23 2234

## 2023-02-07 NOTE — ED Notes (Signed)
Pacing stopped

## 2023-02-07 NOTE — H&P (Addendum)
NAME:  [name], MRN:  [id number], DOB:  1945-07-10, LOS: 0 ADMISSION DATE:  02/07/2023, CONSULTATION DATE:  02/07/2023 REFERRING MD: Dr Rubin Payor, ED, CHIEF COMPLAINT:  AMS, code stroke   History of Present Illness:  Patient was brought into the hospital following being found unresponsive by her daughter, last known well 730 this morning She was found when daughter came back from work, difficulty breathing, EMS activated.  Was bradycardic, hypotensive on initial evaluation, pinpoint pupils, facial droop.  No response to Narcan Intubated in the ED for airway protection  Daughter is unclear whether she may have had a seizure  Had some chest congestion over the last week Was her usual self this morning when she was last seen   Pertinent  Medical History   Past Medical History:  Diagnosis Date   Acute CHF (congestive heart failure) (HCC) 12/14/2014   Hyperlipidemia    Hypertension    Prosthetic valve dysfunction 07/22/2014   Acute mitral stenosis due to restricted leaflet mobility of bileaflet mechanical mitral valve prosthesis   S/P MVR (mitral valve replacement) 04/11/1998   #29 St. 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  No mass effect, no midline shift   Interim History / Subjective:  Elderly lady, unresponsive, endotracheal tube in place, frail appearing  Objective   Blood pressure 111/63, pulse (!) 33, temperature (!) 85.1 F (29.5 C), temperature source Rectal, resp. rate 15, weight 60 kg, SpO2 100%.     Vent Mode: PRVC FiO2 (%):  [60 %-100 %] 60 % Set Rate:  [15 bmp] 15 bmp Vt Set:  [430 mL] 430 mL PEEP:  [5 cmH20] 5 cmH20 Plateau Pressure:  [19 cmH20-23 cmH20] 19 cmH20  No intake or output data in the 24 hours ending 02/07/23 2119 Filed Weights   02/07/23 1906  Weight: 60 kg    Examination: General: Elderly, frail, unresponsive HENT: Moist oral mucosa, endotracheal tube in place Lungs: Clear breath sounds Cardiovascular: S1-S2 appreciated Abdomen: Soft, bowel sounds appreciated Extremities: No clubbing, no edema Neuro: Unresponsive, pupils about 2 mm, nonreactive.  No cough reflex GU:   Resolved Hospital Problem list     Assessment & Plan:  Code stroke -No evidence of acute stroke on MRI  Altered mental status Encephalopathy -Neuromonitoring -Appreciate neurology involvement -EEG pending  Acute hypoxemic respiratory failure -Continue mechanical ventilation  -Target TVol 6-8cc/kgIBW -Target Plateau Pressure < 30cm H20 -Target driving pressure less than 15 cm of water -Target PaO2 55-65: titrate PEEP/FiO2 per protocol -Ventilator associated pneumonia prevention protocol  Subdural hematoma Chronic vascular occlusion Coagulopathy -To receive vitamin K and Kcentra  Atrial fibrillation Bioprosthetic mitral valve replacement -Will need to resume heparin when more stable  History of chronic systolic heart failure  Chronic kidney disease stage IIIa -Avoid nephrotoxic medications -Maintain renal perfusion  Background history of vascular dementia  Patient with unknown downtime Neurochecks Continue close monitoring  Hypothermia -bair hugger   Best Practice (right click and "Reselect all SmartList Selections" daily)   Diet/type: NPO DVT prophylaxis SCD Pressure ulcer(s): N/A GI prophylaxis: PPI Lines: N/A Foley:  Yes, and it is still needed Code Status:  full code Last date of multidisciplinary goals of care discussion [discussed with daughter at  bedside]  Labs   CBC: Recent Labs  Lab 02/07/23 1844 02/07/23 1853 02/07/23 2027  WBC 6.8  --   --   NEUTROABS 5.6  --   --   HGB 9.2* 10.5* 9.2*  HCT 32.0* 31.0* 27.0*  MCV 81.2  --   --   PLT 120*  --   --     Basic Metabolic Panel: Recent Labs  Lab 02/07/23 1844 02/07/23 1853 02/07/23 2027  NA 145 145 145  K 4.6 4.6 4.0  CL 113* 113*  --   CO2 23  --   --   GLUCOSE 131* 128*  --   BUN 22 24*  --   CREATININE 1.94* 1.70*  --   CALCIUM 9.5  --   --    GFR: Estimated Creatinine Clearance: 24.7 mL/min (A) (by C-G formula based on SCr of 1.7 mg/dL (H)). Recent Labs  Lab 02/07/23 1844  WBC 6.8    Liver Function Tests: Recent Labs  Lab 02/07/23 1844  AST 25  ALT 21  ALKPHOS 110  BILITOT 1.6*  PROT 6.5  ALBUMIN 2.7*   No results for input(s): "LIPASE", "AMYLASE" in the last 168 hours. No results for input(s): "AMMONIA" in the last 168 hours.  ABG    Component Value Date/Time   PHART 7.457 (H) 02/07/2023 2027   PCO2ART 32.6 02/07/2023 2027   PO2ART 220 (H) 02/07/2023 2027   HCO3 24.2 02/07/2023 2027   TCO2 25 02/07/2023 2027   ACIDBASEDEF 1.0 02/07/2023 2027   O2SAT 100 02/07/2023 2027     Coagulation Profile: Recent Labs  Lab 02/07/23 1924  INR 8.3*    Cardiac Enzymes: No results for input(s): "CKTOTAL", "CKMB", "CKMBINDEX", "TROPONINI" in the last 168 hours.  HbA1C: Hgb A1c MFr Bld  Date/Time Value Ref Range Status  05/17/2016 09:27 AM 5.5 4.6 - 6.5 % Final    Comment:    Glycemic Control Guidelines for People with Diabetes:Non Diabetic:  <6%Goal of Therapy: <7%Additional Action Suggested:  >8%   02/22/2015 07:07 AM 5.8 (H) 4.8 - 5.6 % Final    Comment:    (NOTE)         Pre-diabetes: 5.7 - 6.4         Diabetes: >6.4         Glycemic control for adults with diabetes: <7.0     CBG: Recent Labs  Lab 02/07/23 1845  GLUCAP 126*    Review of Systems:   Unable to respond  Past Medical History:  She,  has a past medical  history of Acute CHF (congestive heart failure) (HCC) (12/14/2014), Hyperlipidemia, Hypertension, Prosthetic valve dysfunction (07/22/2014), S/P MVR (mitral valve replacement) (04/11/1998), S/P redo mitral valve replacement with bioprosthetic valve (07/25/2014), Stroke (HCC), Thrombosis of prosthetic heart valve (07/25/2014), and Valvular endocarditis (2000).   Surgical History:   Past Surgical History:  Procedure Laterality Date   ABDOMINAL HYSTERECTOMY     CARDIAC CATHETERIZATION N/A 07/24/2014   Procedure: Right/Left Heart Cath and Coronary Angiography;  Surgeon: Iran Ouch, MD;  Location: [facility] INVASIVE CV LAB;  Service: Cardiovascular;  Laterality: N/A;   CARDIAC CATHETERIZATION N/A 07/22/2014   Procedure: Fluoroscopy Guidance;  Surgeon: Lennette Bihari, MD;  Location: [facility] INVASIVE CV LAB;  Service: Cardiovascular;  Laterality: N/A;   CARDIAC CATHETERIZATION N/A 12/16/2014   Procedure: Right Heart Cath;  Surgeon: Laurey Morale, MD;  Location: [facility] INVASIVE CV LAB;  Service: Cardiovascular;  Laterality: N/A;   CARDIAC VALVE REPLACEMENT     MITRAL VALVE REPLACEMENT  04/11/1998   St. Jude mechanical MVR (severe MR, episode of streptococcal bacterial endocarditis - Dr. Cornelius Moras   MITRAL VALVE REPLACEMENT N/A 07/25/2014   Procedure: REDO MITRAL VALVE REPLACEMENT (MVR);  Surgeon: Purcell Nails, MD;  Location: [facility] OR;  Service: Open Heart Surgery;  Laterality: N/A;   TOTAL ABDOMINAL HYSTERECTOMY W/ BILATERAL SALPINGOOPHORECTOMY  12/14/1998   TRANSTHORACIC ECHOCARDIOGRAM  11/2009   EF=>55%; bi-leaflet St. Jude mech prosthesis; mild TR, RVSP elevated at 40-51mmHg, mod pulm HTN; trace AV regurg; mild pulm valve regurg   TUBAL LIGATION       Social History:   reports that she quit smoking about 50 years ago. Her smoking use included cigarettes. She has never used smokeless tobacco. She reports that she does not drink alcohol and does not use drugs.   Family History:  Her family history includes Colon cancer  in her father; Diabetes in her father; Heart attack in her mother; Heart failure in her maternal grandfather and maternal grandmother; Lung cancer in her mother; Stroke in her father, maternal grandfather, and sister. There is no history of Lung disease.   Allergies Allergies  Allergen Reactions   Augmentin [Amoxicillin-Pot Clavulanate] Other (See Comments)    Unknown reaction Has patient had a PCN reaction causing immediate rash, facial/tongue/throat swelling, SOB or lightheadedness with hypotension Has patient had a PCN reaction causing severe rash involving mucus membranes or skin necrosis Has patient had a PCN reaction that required hospitalization  Has patient had a PCN reaction occurring within the last 10 years] If all of the above answers are "NO", then may proceed with Cephalosporin use.      Home Medications  Prior to Admission medications   Medication Sig Start Date End Date Taking? Authorizing Provider  acetaminophen (TYLENOL) 325 MG tablet Take 2 tablets (650 mg total) by mouth every 6 (six) hours as needed (pain, fever, headache, body aches). 09/09/20   Edwin Dada P, DO  allopurinol (ZYLOPRIM) 100 MG tablet Take 1 tablet (100 mg total) by mouth daily. Take in addition to 300 mg for total daily dose of 400 mg allopurinol. 09/30/22   Myrlene Broker, MD  allopurinol (ZYLOPRIM) 300 MG tablet Take 1 tablet (300 mg total) by mouth daily. 01/14/22   Myrlene Broker, MD  aspirin EC 81 MG tablet Take 1 tablet (81 mg total) by mouth daily. 02/24/15   Jerald Kief, MD  atorvastatin (LIPITOR) 40 MG tablet Take 1 tablet (40 mg total) by mouth daily. 11/18/22   Myrlene Broker, MD  carvedilol (COREG) 3.125 MG tablet Take 1 tablet (3.125 mg total) by mouth 2 (two) times daily with a meal. 08/12/22 08/12/23  Lennette Bihari, MD  donepezil (ARICEPT) 10 MG tablet Take 1 tablet (10 mg total) by mouth at bedtime. 01/14/22   Myrlene Broker, MD  doxycycline (VIBRA-TABS) 100 MG  tablet Take 1 tablet (100 mg total) by mouth 2 (two) times daily. 01/23/23   Elenore Paddy, NP  Elastic Bandages & Supports (MEDICAL COMPRESSION STOCKINGS) MISC Use daily 10-20 mmHg 03/25/22   Myrlene Broker, MD  furosemide (LASIX) 20 MG tablet TAKE 1 TABLET BY MOUTH ONCE A DAY (KEEP APPT) 11/26/22 11/26/23  Chrystie Nose, MD  hydrALAZINE (APRESOLINE) 50 MG tablet Take 1 and 1/2 tablets (75 mg total) by mouth  3 times daily. 07/01/22   Myrlene Broker, MD  isosorbide mononitrate (IMDUR) 30 MG 24 hr tablet Take 1 tablet (30 mg total) by mouth daily. 10/01/22   Hilty, Lisette Abu, MD  ondansetron (ZOFRAN) 4 MG tablet Take 1 tablet (4 mg total) by mouth every 6 (six) hours. 09/09/20   Edwin Dada P, DO  pantoprazole (PROTONIX) 40 MG tablet Take 1 tablet (40 mg total) by mouth daily. 10/01/22   Hilty, Lisette Abu, MD  potassium chloride SA (KLOR-CON M) 20 MEQ tablet Take 1 tablet (20 mEq total) by mouth daily. 08/27/22   Myrlene Broker, MD  predniSONE (DELTASONE) 20 MG tablet Take 2 tablets (40 mg total) by mouth daily with breakfast for 5 days 12/30/22   Myrlene Broker, MD  warfarin (COUMADIN) 2 MG tablet TAKE 1 TO 1 & 1/2 TABLETS BY MOUTH DAILY OR AS DIRECTED BY COUMADIN CLINIC 08/12/22 11/15/22  Chrystie Nose, MD    The patient is critically ill with multiple organ systems failure and requires high complexity decision making for assessment and support, frequent evaluation and titration of therapies, application of advanced monitoring technologies and extensive interpretation of multiple databases. Critical Care Time devoted to patient care services described in this note independent of APP/resident time (if applicable)  is 33 minutes.   Virl Diamond MD [facility] Personal pager: See Amion If unanswered, please page CCM On-call: #[phone]

## 2023-02-07 NOTE — ED Triage Notes (Signed)
Pt bib ems as a code stroke. Last seen normal 0730. Daughter found patient unresponsive. Fire found pinpoint pupils,given narcan 2mg  without improvement. Arrived with assisted ventilations. Forved deviation to the R, L sided deficits. HR 42 and hypotensive, EMS started pacing. Pt to room for intubation.

## 2023-02-07 NOTE — ED Notes (Signed)
Pacing adjusted. Rate 70, mA 60

## 2023-02-07 NOTE — Progress Notes (Signed)
Pt transported via ventilator from ED to ICU w/o complications. RT and RN @ bedside. VSS stable throughout, with Receiving unit RN's @ bedside.

## 2023-02-07 NOTE — Progress Notes (Signed)
 Elink monitoring for the code sepsis protocol.

## 2023-02-08 ENCOUNTER — Inpatient Hospital Stay (HOSPITAL_COMMUNITY): Payer: 59

## 2023-02-08 DIAGNOSIS — R579 Shock, unspecified: Secondary | ICD-10-CM | POA: Diagnosis not present

## 2023-02-08 DIAGNOSIS — G9341 Metabolic encephalopathy: Secondary | ICD-10-CM | POA: Diagnosis not present

## 2023-02-08 DIAGNOSIS — R569 Unspecified convulsions: Secondary | ICD-10-CM | POA: Diagnosis not present

## 2023-02-08 DIAGNOSIS — G9340 Encephalopathy, unspecified: Secondary | ICD-10-CM | POA: Diagnosis not present

## 2023-02-08 DIAGNOSIS — J189 Pneumonia, unspecified organism: Secondary | ICD-10-CM

## 2023-02-08 DIAGNOSIS — R4182 Altered mental status, unspecified: Secondary | ICD-10-CM

## 2023-02-08 DIAGNOSIS — T45511A Poisoning by anticoagulants, accidental (unintentional), initial encounter: Secondary | ICD-10-CM | POA: Diagnosis not present

## 2023-02-08 DIAGNOSIS — S065XAA Traumatic subdural hemorrhage with loss of consciousness status unknown, initial encounter: Secondary | ICD-10-CM | POA: Diagnosis not present

## 2023-02-08 DIAGNOSIS — I4891 Unspecified atrial fibrillation: Secondary | ICD-10-CM | POA: Diagnosis not present

## 2023-02-08 LAB — POCT I-STAT 7, (LYTES, BLD GAS, ICA,H+H)
Acid-base deficit: 1 mmol/L (ref 0.0–2.0)
Bicarbonate: 25 mmol/L (ref 20.0–28.0)
Calcium, Ion: 1.37 mmol/L (ref 1.15–1.40)
HCT: 29 % — ABNORMAL LOW (ref 36.0–46.0)
Hemoglobin: 9.9 g/dL — ABNORMAL LOW (ref 12.0–15.0)
O2 Saturation: 99 %
Patient temperature: 98.6
Potassium: 3.9 mmol/L (ref 3.5–5.1)
Sodium: 144 mmol/L (ref 135–145)
TCO2: 26 mmol/L (ref 22–32)
pCO2 arterial: 44.5 mm[Hg] (ref 32–48)
pH, Arterial: 7.357 (ref 7.35–7.45)
pO2, Arterial: 138 mm[Hg] — ABNORMAL HIGH (ref 83–108)

## 2023-02-08 LAB — GLUCOSE, CAPILLARY
Glucose-Capillary: 51 mg/dL — ABNORMAL LOW (ref 70–99)
Glucose-Capillary: 77 mg/dL (ref 70–99)
Glucose-Capillary: 78 mg/dL (ref 70–99)
Glucose-Capillary: 80 mg/dL (ref 70–99)
Glucose-Capillary: 81 mg/dL (ref 70–99)

## 2023-02-08 LAB — BASIC METABOLIC PANEL
Anion gap: 10 (ref 5–15)
BUN: 22 mg/dL (ref 8–23)
CO2: 22 mmol/L (ref 22–32)
Calcium: 9.5 mg/dL (ref 8.9–10.3)
Chloride: 112 mmol/L — ABNORMAL HIGH (ref 98–111)
Creatinine, Ser: 1.77 mg/dL — ABNORMAL HIGH (ref 0.44–1.00)
GFR, Estimated: 30 mL/min — ABNORMAL LOW (ref >60)
Glucose, Bld: 121 mg/dL — ABNORMAL HIGH (ref 70–99)
Potassium: 4 mmol/L (ref 3.5–5.1)
Sodium: 144 mmol/L (ref 135–145)

## 2023-02-08 LAB — PROTIME-INR
INR: 1.7 — ABNORMAL HIGH (ref 0.8–1.2)
Prothrombin Time: 19.9 s — ABNORMAL HIGH (ref 11.4–15.2)

## 2023-02-08 LAB — COOXEMETRY PANEL
Carboxyhemoglobin: 1.7 % — ABNORMAL HIGH (ref 0.5–1.5)
Methemoglobin: <0.7 % (ref 0.0–1.5)
O2 Saturation: 73.6 %
Total hemoglobin: 9 g/dL — ABNORMAL LOW (ref 12.0–16.0)

## 2023-02-08 LAB — PHOSPHORUS: Phosphorus: 2.3 mg/dL — ABNORMAL LOW (ref 2.5–4.6)

## 2023-02-08 LAB — CBC
HCT: 28.6 % — ABNORMAL LOW (ref 36.0–46.0)
Hemoglobin: 8.4 g/dL — ABNORMAL LOW (ref 12.0–15.0)
MCH: 23.5 pg — ABNORMAL LOW (ref 26.0–34.0)
MCHC: 29.4 g/dL — ABNORMAL LOW (ref 30.0–36.0)
MCV: 80.1 fL (ref 80.0–100.0)
Platelets: 122 10*3/uL — ABNORMAL LOW (ref 150–400)
RBC: 3.57 MIL/uL — ABNORMAL LOW (ref 3.87–5.11)
RDW: 23.4 % — ABNORMAL HIGH (ref 11.5–15.5)
WBC: 6.8 10*3/uL (ref 4.0–10.5)
nRBC: 1.5 % — ABNORMAL HIGH (ref 0.0–0.2)

## 2023-02-08 LAB — MRSA NEXT GEN BY PCR, NASAL: MRSA by PCR Next Gen: NOT DETECTED

## 2023-02-08 LAB — PROCALCITONIN: Procalcitonin: 0.54 ng/mL

## 2023-02-08 LAB — MAGNESIUM: Magnesium: 1.5 mg/dL — ABNORMAL LOW (ref 1.7–2.4)

## 2023-02-08 MED ORDER — DEXTROSE 50 % IV SOLN
INTRAVENOUS | Status: AC
  Filled 2023-02-08: qty 50

## 2023-02-08 MED ORDER — ORAL CARE MOUTH RINSE: 15.0000 mL | OROMUCOSAL | Status: DC | PRN

## 2023-02-08 MED ORDER — SODIUM CHLORIDE 0.9 % IV SOLN
2.0000 g | INTRAVENOUS | Status: DC
  Administered 2023-02-08 – 2023-02-11 (×4): 2 g via INTRAVENOUS
  Filled 2023-02-08 (×4): qty 20

## 2023-02-08 MED ORDER — DEXTROSE IN LACTATED RINGERS 5 % IV SOLN: INTRAVENOUS | Status: AC

## 2023-02-08 MED ORDER — FENTANYL 2500MCG IN NS 250ML (10MCG/ML) PREMIX INFUSION: 25.0000 ug/h | INTRAVENOUS | Status: DC

## 2023-02-08 MED ORDER — FENTANYL CITRATE PF 50 MCG/ML IJ SOSY
25.0000 ug | PREFILLED_SYRINGE | INTRAMUSCULAR | Status: DC | PRN
  Administered 2023-02-09 – 2023-02-10 (×3): 50 ug via INTRAVENOUS
  Administered 2023-02-11 – 2023-02-13 (×2): 25 ug via INTRAVENOUS
  Administered 2023-02-13: 50 ug via INTRAVENOUS
  Filled 2023-02-08 (×7): qty 1

## 2023-02-08 MED ORDER — MIDAZOLAM HCL 2 MG/2ML IJ SOLN
2.0000 mg | INTRAMUSCULAR | Status: DC | PRN
  Administered 2023-02-09: 2 mg via INTRAVENOUS
  Filled 2023-02-08 (×2): qty 2

## 2023-02-08 MED ORDER — PIVOT 1.5 CAL PO LIQD
1000.0000 mL | ORAL | Status: DC
  Administered 2023-02-08 – 2023-02-09 (×2): 1000 mL

## 2023-02-08 MED ORDER — ORAL CARE MOUTH RINSE
15.0000 mL | OROMUCOSAL | Status: DC
  Administered 2023-02-08 – 2023-02-17 (×106): 15 mL via OROMUCOSAL

## 2023-02-08 MED ORDER — DEXTROSE 50 % IV SOLN
25.0000 g | Freq: Once | INTRAVENOUS | Status: AC
  Administered 2023-02-08: 25 g via INTRAVENOUS

## 2023-02-08 MED ORDER — PROSOURCE TF20 ENFIT COMPATIBL EN LIQD
60.0000 mL | Freq: Every day | ENTERAL | Status: DC
  Administered 2023-02-08 – 2023-02-10 (×3): 60 mL
  Filled 2023-02-08 (×3): qty 60

## 2023-02-08 MED ORDER — CHLORHEXIDINE GLUCONATE CLOTH 2 % EX PADS
6.0000 | MEDICATED_PAD | Freq: Every day | CUTANEOUS | Status: DC
  Administered 2023-02-08 – 2023-02-22 (×15): 6 via TOPICAL

## 2023-02-08 MED ORDER — FENTANYL CITRATE PF 50 MCG/ML IJ SOSY
25.0000 ug | PREFILLED_SYRINGE | INTRAMUSCULAR | Status: DC | PRN
  Administered 2023-02-10 – 2023-02-13 (×2): 25 ug via INTRAVENOUS
  Filled 2023-02-08 (×2): qty 1

## 2023-02-08 NOTE — Progress Notes (Signed)
LTM EEG running - no initial skin breakdown - push button tested - neuro notified.  

## 2023-02-08 NOTE — Procedures (Signed)
Central Venous Catheter Insertion Procedure Note  [name]  [id number]  30-Nov-1945  Date:02/08/23  Time:1:45 PM   Provider Performing:[name]   Procedure: Insertion of Non-tunneled Central Venous [id number]) with US guidance (84696)   Indication(s) Medication administration and Difficult access  Consent Risks of the procedure as well as the alternatives and risks of each were explained to the patient and/or caregiver.  Consent for the procedure was obtained and is signed in the bedside chart  Anesthesia Topical only with 1% lidocaine   Timeout Verified patient identification, verified procedure, site/side was marked, verified correct patient position, special equipment/implants available, medications/allergies/relevant history reviewed, required imaging and test results available.  Sterile Technique Maximal sterile technique including full sterile barrier drape, hand hygiene, sterile gown, sterile gloves, mask, hair covering, sterile ultrasound probe cover (if used).  Procedure Description Area of catheter insertion was cleaned with chlorhexidine and draped in sterile fashion.  With real-time ultrasound guidance a central venous catheter was placed into the left internal jugular vein. Nonpulsatile blood flow and easy flushing noted in all ports.  The catheter was sutured in place and sterile dressing applied.  Complications/Tolerance None; patient tolerated the procedure well. Chest X-ray is ordered to verify placement for internal jugular or subclavian cannulation.   Chest x-ray is not ordered for femoral cannulation.  EBL Minimal  Specimen(s) None [name] ACNP-BC [facility] Pulmonary/Critical Care Pager # [phone] OR # [phone] if no answer

## 2023-02-08 NOTE — Consult Note (Signed)
   Providing Compassionate, Quality Care - Together  Neurosurgery Consult  Referring physician: ED MD Reason for referral: SDH, basilar occlusion  History of Present Illness: Pt presented to ED last night via EMS after being found down and obtunded by her daughter with a last known normal yesterday around 0730. Pt was intubated in ED upon arrival. Initial workup revealing small mixed density R SDH, basilar and L vert occlusion. 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 Patient supratherapeutic on Warfarin for afib, MVR. Was given Kcentra in ED.   Physical Exam:  BP (!) 81/61 (BP Location: Left Arm)   Pulse 80   Temp 98.2 F (36.8 C) (Esophageal)   Resp 15   Wt 60.4 kg   LMP  (LMP Unknown)   SpO2 100%   BMI 22.86 kg/m    PE: Intubated Pupils 1mm b/l, minimally reactive Cough present Corneals present b/l Withdraws to stimuli BLE No response BUE   Impression/Assessment:  This is a 78 yo woman with   -Acute on chronic R SDH -Chronic basilar/L vertebral artery occlusion -Severe diffuse encephalopathy as evidenced by EEG   Plan:  -Repeat CTH ordered to monitor progression of SDH -Agree w/ goals of therapeutic INR with transition to heparin pending stability of CTH.  -Call w/ questions/concerns.   Carey Lafon Margaree Mackintosh, PA-C

## 2023-02-08 NOTE — Progress Notes (Addendum)
NEUROLOGY CONSULT FOLLOW UP NOTE   Date of service: February 08, 2023 Patient Name: [name] MRN:  [id number] DOB:  November 16, 1945  Interval Hx/subjective  Patient has been somewhat hypotensive overnight.  EEG shows no seizure activity but moderate to severe diffuse encephalopathy.  CCM team is weaning sedation, the patient is currently responsive only to noxious stimuli. Vitals   Vitals:   02/08/23 1015 02/08/23 1016 02/08/23 1030 02/08/23 1045  BP:  95/83 93/65   Pulse: 83 83 78 81  Resp: 13 15 15 15   Temp:  98.6 F (37 C) 98.2 F (36.8 C) 98.1 F (36.7 C)  TempSrc:  Esophageal    SpO2: 100% 100% 99% 99%  Weight:         Body mass index is 22.86 kg/m.  Physical Exam   Constitutional: Intubated elderly patient in no acute distress Eyes: No scleral injection.  HENT: ET tube in place Head: Normocephalic.  Cardiovascular: Normal rate and regular rhythm.  Respiratory: Respirations synchronous with ventilator Skin: WDI.   Neurologic Examination   Sedation with fentanyl turned off about 10 minutes before exam Pupils small but reactive to light, oculocephalic reflex absent, good corneal reflexes bilaterally, weak cough and gag.  Patient does not respond to voice and is unable to follow commands but will flicker all 4 extremities to noxious stimuli  Medications  Current Facility-Administered Medications:    0.9 %  sodium chloride infusion, 250 mL, Intravenous, Continuous, Olalere, Adewale A, MD, Held at 02/08/23 0038   Chlorhexidine Gluconate Cloth 2 % PADS 6 each, 6 each, Topical, Daily, Olalere, Adewale A, MD   dextrose 50 % solution, , , ,    docusate (COLACE) 50 MG/5ML liquid 100 mg, 100 mg, Per Tube, BID PRN, Olalere, Adewale A, MD   fentaNYL (SUBLIMAZE) bolus via infusion 25-100 mcg, 25-100 mcg, Intravenous, Q15 min PRN, Benjiman Core, MD   fentaNYL (SUBLIMAZE) injection 25 mcg, 25 mcg, Intravenous, Once, Benjiman Core, MD   fentaNYL in NS  (89mcg/ml) infusion-PREMIX, 25-200 mcg/hr, Intravenous, Continuous, Ogan, Okoronkwo U, MD, Last Rate: 2.5 mL/hr at 02/08/23 0900, 25 mcg/hr at 02/08/23 0900   lactated ringers infusion, , Intravenous, Continuous, Ogan, Okoronkwo U, MD, Last Rate: 100 mL/hr at 02/08/23 0900, Infusion Verify at 02/08/23 0900   levETIRAcetam (KEPPRA) IVPB 500 mg/100 mL premix, 500 mg, Intravenous, BID, Erick Blinks, MD, Last Rate: 400 mL/hr at 02/08/23 0919, 500 mg at 02/08/23 0919   norepinephrine (LEVOPHED) 4mg  in (0.016 mg/mL) premix infusion, 2-10 mcg/min, Intravenous, Titrated, Olalere, Adewale A, MD, Last Rate: 37.5 mL/hr at 02/08/23 0900, 10 mcg/min at 02/08/23 0900   Oral care mouth rinse, 15 mL, Mouth Rinse, Q2H, Olalere, Adewale A, MD, 15 mL at 02/08/23 0919   Oral care mouth rinse, 15 mL, Mouth Rinse, PRN, Olalere, Adewale A, MD   pantoprazole (PROTONIX) injection 40 mg, 40 mg, Intravenous, QHS, Olalere, Adewale A, MD, 40 mg at 02/08/23 0107   polyethylene glycol (MIRALAX / GLYCOLAX) packet 17 g, 17 g, Per Tube, Daily PRN, Virl Diamond A, MD  Labs and Diagnostic Imaging   CBC:  Recent Labs  Lab 02/07/23 1844 02/07/23 1853 02/08/23 0203 02/08/23 1107  WBC 6.8  --  6.8  --   NEUTROABS 5.6  --   --   --   HGB 9.2*   < > 8.4* 9.9*  HCT 32.0*   < > 28.6* 29.0*  MCV 81.2  --  80.1  --   PLT 120*  --  122*  --    < > = values in this interval not displayed.    Basic Metabolic Panel:  Lab Results  Component Value Date   NA 144 02/08/2023   K 3.9 02/08/2023   CO2 22 02/08/2023   GLUCOSE 121 (H) 02/08/2023   BUN 22 02/08/2023   CREATININE 1.77 (H) 02/08/2023   CALCIUM 9.5 02/08/2023   GFRNONAA 30 (L) 02/08/2023   GFRAA 42 (L) 03/14/2015   Lipid Panel:  Lab Results  Component Value Date   LDLCALC 85 08/24/2021   HgbA1c:  Lab Results  Component Value Date   HGBA1C 5.5 05/17/2016   Urine Drug Screen:     Component Value Date/Time   LABOPIA NONE DETECTED 02/07/2023  2058   COCAINSCRNUR NONE DETECTED 02/07/2023 2058   LABBENZ NONE DETECTED 02/07/2023 2058   AMPHETMU NONE DETECTED 02/07/2023 2058   THCU NONE DETECTED 02/07/2023 2058   LABBARB NONE DETECTED 02/07/2023 2058    Alcohol Level     Component Value Date/Time   ETH <10 02/07/2023 1844   INR  Lab Results  Component Value Date   INR 1.7 (H) 02/08/2023   APTT  Lab Results  Component Value Date   APTT 89 (H) 02/07/2023   AED levels: No results found for: "PHENYTOIN", "ZONISAMIDE", "LAMOTRIGINE", "LEVETIRACETA"  CT Head without contrast(Personally reviewed): New mixed density right subdural hematoma  Repeat head CT 2/1: Unchanged small right cerebral convexity subdural hematoma with no new abnormality, atrophy and multiple chronic infarcts  CT angio Head and Neck with contrast(Personally reviewed): Chronic occlusions of basilar and left vertebral arteries  MRI Brain(Personally reviewed): No acute infarct, similar appearance of right subdural hematoma  Continuous EEG 2/1:  Continuous slow, generalized, suggestive of moderate to severe diffuse encephalopathy with no seizures or epileptiform discharges.  Okay  Assessment   [name] is a 78 y.o. female with history of hypertension, bioprosthetic mitral valve replacement, A-fib on warfarin and endocarditis who was found unresponsive yesterday.  On arrival to the ED, she was noted to have pinpoint pupils, right-sided hemiparesis and right gaze deviation.  She was intubated for airway protection.  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  MRI brain revealed no stroke.  Patient was given Ativan and loaded with Keppra.  Neurosurgery does not want to pursue surgical intervention on small subdural hematoma.  Given A-fib and mitral valve replacement, CCM team plans to restart anticoagulation with heparin possibly tomorrow.  On exam  today with sedation having recently been turned off, she is able to move all 4 extremities to noxious stimuli but is not able to respond to voice or follow commands.  Recommendations  - Continue Keppra 500 mg twice daily - Continuous continuous EEG, will discontinue tomorrow if no epileptiform activity seen - Continue seizure precautions - Management of subdural hematoma per neurosurgery - Anticoagulation per primary team, reasonable to wait another day ______________________________________________________________________  Patient seen by NP and then by MD, MD to edit note as needed. Signed, Cortney E Ernestina Columbia, NP [facility]    Attending Neurohospitalist Addendum Patient seen and examined with APP/Resident. Agree with the history and physical as documented above. Agree with the plan as documented, which I helped formulate. I have edited the note above to reflect my full findings and recommendations. I have independently reviewed the chart, obtained history, review of systems and examined the patient.I have personally reviewed pertinent  head/neck/spine imaging (CT/MRI). Please feel free to call with any questions.  This patient is critically ill and at significant risk of neurological worsening, death and care requires constant monitoring of vital signs, hemodynamics,respiratory and cardiac monitoring, neurological assessment, discussion with family, other specialists and medical decision making of high complexity. I spent 45 minutes of neurocritical care time  in the care of  this patient. This was time spent independent of any time provided by nurse practitioner or PA.  Bing Neighbors, MD [facility] [phone]  If 7pm- 7am, please page neurology on call as listed in AMION.

## 2023-02-08 NOTE — Progress Notes (Signed)
Called to bedside While pt receiving care started to have rapid bilateral arm jerking, leg jerking to lessor degree and more prominent on left than right as well as rapid repetitive eye movements (opening and closing but not tracking or focusing).  These activities seem to be predictably precipitated by painful or uncomfortable stimulation and subside spontaneously.  We did ring into EEG to have evaluated and time stamp activity w/ EEG forms.  It looks more myoclonic or agitation to me than seizure.  Plan Cont LTM Epileptology to review Cont current keppra Will place PRN benzo if this becomes refractory

## 2023-02-08 NOTE — Progress Notes (Addendum)
NAME:  [name], MRN:  [id number], DOB:  July 29, 1945, LOS: 1 ADMISSION DATE:  02/07/2023, CONSULTATION DATE:  02/07/2023 REFERRING MD: Dr Rubin Payor, ED, CHIEF COMPLAINT:  AMS, code stroke   History of Present Illness:  Patient was brought into the hospital following being found unresponsive by her daughter, last known well 730 this morning She was found when daughter came back from work, difficulty breathing, EMS activated.  Was bradycardic, hypotensive on initial evaluation, pinpoint pupils, facial droop.  No response to Narcan Intubated in the ED for airway protection  Daughter is unclear whether she may have had a seizure  Had some chest congestion over the last week Was her usual self this morning when she was last seen   Pertinent  Medical History   Past Medical History:  Diagnosis Date   Acute CHF (congestive heart failure) (HCC) 12/14/2014   Hyperlipidemia    Hypertension    Prosthetic valve dysfunction 07/22/2014   Acute mitral stenosis due to restricted leaflet mobility of bileaflet mechanical mitral valve prosthesis   S/P MVR (mitral valve replacement) 04/11/1998   #29 St. 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  No mass effect, no midline shift.  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  Required intubation for airway protection.  Seen by neurosurgery felt not a candidate for surgery with small acute on chronic subdurals recommended against reversal of warfarin given concern about  basilar artery occlusion.  Seen by neurology, EEG ordered, started on Keppra.  Hypotensive, started on norepinephrine 2/1 EEG suggesting moderate to severe diffuse encephalopathy but no seizure or epileptiform discharges.  Repeat CT head ordered, and showing no new changes  Interim History / Subjective:  She is now returning from CT no significant changes noted  Objective   Blood pressure (!) 81/61, pulse 80, temperature 98.2 F (36.8 C), temperature source Esophageal, resp. rate 15, weight 60.4 kg, SpO2 100%.    Vent Mode: PRVC FiO2 (%):  [40 %-100 %] 40 % Set Rate:  [15 bmp] 15 bmp Vt Set:  [430 mL] 430 mL PEEP:  [5 cmH20] 5 cmH20 Plateau Pressure:  [18 cmH20-23 cmH20] 18 cmH20   Intake/Output Summary (Last 24 hours) at 02/08/2023 0959 Last data filed at 02/08/2023 0800 Gross per 24 hour  Intake 2487.24 ml  Output 143 ml  Net 2344.24 ml   Filed Weights   02/07/23 1906 02/08/23 0030 02/08/23 0500  Weight: 60 kg 60.4 kg 60.4 kg    Examination:  General 78 year old female patient lying in bed currently on full vent support, but she is able to ventilate spontaneously HEENT normocephalic atraumatic orally intubated pupils are pinpoint Pulmonary coarse right greater than left rhonchi portable chest x-ray personally reviewed endotracheal tube in satisfactory position there is right greater than left airspace disease Cardiac regular irregular atrial fibrillation noted on telemetry Extremities warm dry dependent edema pulses palpable Abdomen soft not tender GU minimal urine output concentrated Neuro sedated on fentanyl this was just discontinued on my arrival.  Flicker of a withdrawal on the left upper  and lower no response on the right side.  She did open her eyes spontaneously once Resolved Hospital Problem list    Hypothyroidism Lactic acidosis Assessment & Plan:   Acute metabolic encephalopathy secondary to postictal state, in setting of new subdural hematoma and chronic  vertebral artery occlusion and history of vascular dementia -Already started on Keppra, EEG currently negative for seizure.  She is status post Kcentra and vitamin K on 1/31 Plan Continuing Keppra Avoid fever Keep euglycemic Ensure adequate mean arterial pressure Additional recommendations per neurology and neurosurgery Continue to hold her anticoagulation, will discuss with neurosurgery we can likely resume [facility] tomorrow without bolus  Mechanical ventilator management Intubated for airway protection, ABG reviewed, she did have some mild iatrogenic respiratory alkalosis Plan Continue full ventilator support Repeat arterial blood gas this morning to ensure they are not over ventilating her VAP bundle PAD protocol RASS goal -1 Daily assessment for readiness to wean  Possible aspiration pneumonia Plan Sending sputum culture Check procalcitonin Start ceftriaxone.  Has Augmentin noted his allergy but has taken cephalosporins in the past  Circulatory shock with lactic acidosis superimposed on history of chronic systolic heart failure Etiology of shock not clear.  Question medication induced? No leukocytosis  Remains on norepinephrine infusion, lactate cleared plan Continue telemetry monitoring Place central line, obtain central venous pressure and Co. Ox Ensure she is euvolemic Continue to titrate norepinephrine for mean arterial pressure greater than 65 Check procalcitonin and BNP Minimize sedating medications Holding her Coreg hydralazine Imdur and Apresoline  Atrial fibrillation Bioprosthetic mitral valve replacement She had been on warfarin  plan Follow-up formal read on CT head Continue to hold warfarin, no need to administer further vitamin K unless CT head worse, start IV heparin when okay with neurosurg No bolus Rate control  Chronic kidney disease stage IIIa -Serum creatinine slightly elevated, but improved from admission Plan Ensure adequate mean arterial pressure  of greater than 65 Strict intake output Renal dose medications A.m. chemistry  Anemia without evidence of bleeding Plan Continue to monitor Trigger for transfusion is hemoglobin less than 7  Thrombocytopenia Stable Plan Monitor  Hypoglycemia Plan Add dextrose to IVFs Start tubefeeds  Best Practice (right click and "Reselect all SmartList Selections" daily)   Diet/type: tubefeeds DVT prophylaxis SCD Pressure ulcer(s): N/A GI prophylaxis: PPI Lines: N/A Foley:  Yes, and it is still needed Code Status:  full code Last date of multidisciplinary goals of care discussion [discussed with daughter at bedside]  My time 39 min

## 2023-02-08 NOTE — Progress Notes (Signed)
Pt transported to and from CT scan on the ventilator without incident.

## 2023-02-08 NOTE — Plan of Care (Signed)
  Problem: Clinical Measurements: Goal: Ability to maintain clinical measurements within normal limits will improve Outcome: Progressing Goal: Will remain free from infection Outcome: Progressing Goal: Cardiovascular complication will be avoided Outcome: Progressing   Problem: Elimination: Goal: Will not experience complications related to urinary retention Outcome: Progressing   Problem: Pain Managment: Goal: General experience of comfort will improve and/or be controlled Outcome: Progressing

## 2023-02-08 NOTE — Progress Notes (Signed)
eLink Physician-Brief Progress Note Patient Name: [name] DOB: 21-Nov-1945 MRN: [id number]   Date of Service  02/08/2023  HPI/Events of Note  Patient admitted with altered mental status, acute respiratory failure requiring intubation, suspected seizures, in the context of a subdural hematoma.  eICU Interventions  New Patient Evaluation.        Thomasene Lot Zalea Pete 02/08/2023, 12:29 AM

## 2023-02-08 NOTE — Procedures (Signed)
Patient Name: [name]  MRN: [id number]  Epilepsy Attending: Charlsie Quest  Referring Physician/Provider: Erick Blinks, MD  Duration: 02/08/2023 0129 to 02/09/2023 0129  Patient history: 78 y.o. female who was found unresponsive by her daughter. Brougt in bradycardic, hypotensive with R gaze deviation, left sided weakness and obtunded mentation and intubated. She was found to have mixed density right SDH with no significant mass effect. EEG to evaluate for seizure  Level of alertness:  comatose/ lethargic   AEDs during EEG study: LEV  Technical aspects: This EEG study was done with scalp electrodes positioned according to the 10-20 International system of electrode placement. Electrical activity was reviewed with band pass filter of 1-70Hz , sensitivity of 7 uV/mm, display speed of 19mm/sec with a 60Hz  notched filter applied as appropriate. EEG data were recorded continuously and digitally stored.  Video monitoring was available and reviewed as appropriate.  Description: EEG showed near continuous generalized 3 to 6 Hz theta-delta slowing admixed with 1-2 seconds of generalized attenuation. Hyperventilation and photic stimulation were not performed.     Event button was pressed on 02/09/2023 at 0738 for unclear reason. Concomitant eeg showed sharp waves in left parieto-occipital region which appeared qasi-periodic at 0.5 to 1hz .   ABNORMALITY - Sharp waves, left parieto-occipital region  - Continuous slow, generalized  IMPRESSION: This study showed evidence of epileptogenicity arising from  left parieto-occipital region. Additionally there is moderate to severe diffuse encephalopathy.   Event button was pressed on 02/09/2023 at 0738 for unclear reason. Concomitant eeg showed sharp waves in left parieto-occipital region which appeared qasi-periodic at 0.5 to 1hz . This is suggestive of increased risk of focal seizure from left parieto-occipital region.   [name] [name]

## 2023-02-08 NOTE — ED Notes (Signed)
MD contacted regarding patients BP. Levophed ordered and administered.

## 2023-02-08 DEATH — deceased

## 2023-02-09 ENCOUNTER — Other Ambulatory Visit: Payer: Self-pay

## 2023-02-09 ENCOUNTER — Inpatient Hospital Stay (HOSPITAL_COMMUNITY): Payer: 59

## 2023-02-09 ENCOUNTER — Encounter (HOSPITAL_COMMUNITY): Payer: 59

## 2023-02-09 DIAGNOSIS — J9601 Acute respiratory failure with hypoxia: Secondary | ICD-10-CM | POA: Diagnosis not present

## 2023-02-09 DIAGNOSIS — S065XAA Traumatic subdural hemorrhage with loss of consciousness status unknown, initial encounter: Secondary | ICD-10-CM

## 2023-02-09 DIAGNOSIS — G9340 Encephalopathy, unspecified: Secondary | ICD-10-CM | POA: Diagnosis not present

## 2023-02-09 DIAGNOSIS — R570 Cardiogenic shock: Secondary | ICD-10-CM

## 2023-02-09 DIAGNOSIS — I4891 Unspecified atrial fibrillation: Secondary | ICD-10-CM | POA: Diagnosis not present

## 2023-02-09 DIAGNOSIS — R569 Unspecified convulsions: Secondary | ICD-10-CM | POA: Diagnosis not present

## 2023-02-09 DIAGNOSIS — I959 Hypotension, unspecified: Secondary | ICD-10-CM

## 2023-02-09 DIAGNOSIS — J9600 Acute respiratory failure, unspecified whether with hypoxia or hypercapnia: Secondary | ICD-10-CM

## 2023-02-09 DIAGNOSIS — R4182 Altered mental status, unspecified: Secondary | ICD-10-CM | POA: Diagnosis not present

## 2023-02-09 DIAGNOSIS — R579 Shock, unspecified: Secondary | ICD-10-CM | POA: Diagnosis not present

## 2023-02-09 DIAGNOSIS — J189 Pneumonia, unspecified organism: Secondary | ICD-10-CM | POA: Diagnosis not present

## 2023-02-09 DIAGNOSIS — G9341 Metabolic encephalopathy: Secondary | ICD-10-CM | POA: Diagnosis not present

## 2023-02-09 DIAGNOSIS — J9602 Acute respiratory failure with hypercapnia: Secondary | ICD-10-CM

## 2023-02-09 LAB — RESPIRATORY PANEL BY PCR

## 2023-02-09 LAB — MAGNESIUM
Magnesium: 1.4 mg/dL — ABNORMAL LOW (ref 1.7–2.4)
Magnesium: 2.1 mg/dL (ref 1.7–2.4)

## 2023-02-09 LAB — BASIC METABOLIC PANEL
Anion gap: 4 — ABNORMAL LOW (ref 5–15)
BUN: 24 mg/dL — ABNORMAL HIGH (ref 8–23)
CO2: 25 mmol/L (ref 22–32)
Calcium: 8.7 mg/dL — ABNORMAL LOW (ref 8.9–10.3)
Chloride: 112 mmol/L — ABNORMAL HIGH (ref 98–111)
Creatinine, Ser: 1.94 mg/dL — ABNORMAL HIGH (ref 0.44–1.00)
GFR, Estimated: 27 mL/min — ABNORMAL LOW (ref >60)
Glucose, Bld: 87 mg/dL (ref 70–99)
Potassium: 3.7 mmol/L (ref 3.5–5.1)
Sodium: 141 mmol/L (ref 135–145)

## 2023-02-09 LAB — PROTIME-INR
INR: 1.4 — ABNORMAL HIGH (ref 0.8–1.2)
INR: 1.5 — ABNORMAL HIGH (ref 0.8–1.2)
Prothrombin Time: 17.6 s — ABNORMAL HIGH (ref 11.4–15.2)
Prothrombin Time: 18 s — ABNORMAL HIGH (ref 11.4–15.2)

## 2023-02-09 LAB — CG4 I-STAT (LACTIC ACID)
Lactic Acid, Venous: 0.6 mmol/L (ref 0.5–1.9)
Lactic Acid, Venous: 0.6 mmol/L (ref 0.5–1.9)

## 2023-02-09 LAB — ECHOCARDIOGRAM LIMITED
Calc EF: 45.8 %
S' Lateral: 2.3 cm
Single Plane A2C EF: 39.5 %
Single Plane A4C EF: 53.5 %
Weight: 2236.35 [oz_av]

## 2023-02-09 LAB — CBC
HCT: 29.1 % — ABNORMAL LOW (ref 36.0–46.0)
Hemoglobin: 8.7 g/dL — ABNORMAL LOW (ref 12.0–15.0)
MCH: 23.6 pg — ABNORMAL LOW (ref 26.0–34.0)
MCHC: 29.9 g/dL — ABNORMAL LOW (ref 30.0–36.0)
MCV: 78.9 fL — ABNORMAL LOW (ref 80.0–100.0)
Platelets: 151 10*3/uL (ref 150–400)
RBC: 3.69 MIL/uL — ABNORMAL LOW (ref 3.87–5.11)
RDW: 22.8 % — ABNORMAL HIGH (ref 11.5–15.5)
WBC: 9.2 10*3/uL (ref 4.0–10.5)
nRBC: 2.2 % — ABNORMAL HIGH (ref 0.0–0.2)

## 2023-02-09 LAB — GLUCOSE, CAPILLARY
Glucose-Capillary: 102 mg/dL — ABNORMAL HIGH (ref 70–99)
Glucose-Capillary: 108 mg/dL — ABNORMAL HIGH (ref 70–99)
Glucose-Capillary: 113 mg/dL — ABNORMAL HIGH (ref 70–99)
Glucose-Capillary: 114 mg/dL — ABNORMAL HIGH (ref 70–99)
Glucose-Capillary: 63 mg/dL — ABNORMAL LOW (ref 70–99)
Glucose-Capillary: 95 mg/dL (ref 70–99)
Glucose-Capillary: 98 mg/dL (ref 70–99)

## 2023-02-09 LAB — PROCALCITONIN: Procalcitonin: 1.17 ng/mL

## 2023-02-09 LAB — PHOSPHORUS
Phosphorus: 1.9 mg/dL — ABNORMAL LOW (ref 2.5–4.6)
Phosphorus: 3 mg/dL (ref 2.5–4.6)

## 2023-02-09 LAB — BRAIN NATRIURETIC PEPTIDE: B Natriuretic Peptide: 863.1 pg/mL — ABNORMAL HIGH (ref 0.0–100.0)

## 2023-02-09 MED ORDER — PNEUMOCOCCAL 20-VAL CONJ VACC 0.5 ML IM SUSY: 0.5000 mL | PREFILLED_SYRINGE | INTRAMUSCULAR | Status: DC | PRN

## 2023-02-09 MED ORDER — INFLUENZA VAC A&B SURF ANT ADJ 0.5 ML IM SUSY: 0.5000 mL | PREFILLED_SYRINGE | INTRAMUSCULAR | Status: DC | PRN

## 2023-02-09 MED ORDER — MAGNESIUM SULFATE 4 GM/100ML IV SOLN
4.0000 g | Freq: Once | INTRAVENOUS | Status: AC
  Administered 2023-02-09: 4 g via INTRAVENOUS
  Filled 2023-02-09: qty 100

## 2023-02-09 MED ORDER — NOREPINEPHRINE 4 MG/250ML-% IV SOLN
0.0000 ug/min | INTRAVENOUS | Status: DC
  Administered 2023-02-09: 10 ug/min via INTRAVENOUS
  Administered 2023-02-09: 9 ug/min via INTRAVENOUS
  Administered 2023-02-10: 6 ug/min via INTRAVENOUS
  Filled 2023-02-09 (×2): qty 250

## 2023-02-09 MED ORDER — POTASSIUM PHOSPHATES 15 MMOLE/5ML IV SOLN
30.0000 mmol | Freq: Once | INTRAVENOUS | Status: AC
  Administered 2023-02-09: 30 mmol via INTRAVENOUS
  Filled 2023-02-09: qty 10

## 2023-02-09 MED ORDER — DEXTROSE 50 % IV SOLN
25.0000 mL | Freq: Once | INTRAVENOUS | Status: AC
Start: 2023-02-09 — End: 2023-02-09
  Administered 2023-02-09: 25 mL via INTRAVENOUS
  Filled 2023-02-09: qty 50

## 2023-02-09 NOTE — Progress Notes (Signed)
Brief Nutrition Note  Consult received for enteral/tube feeding initiation and management.  Adult Enteral Nutrition Protocol initiated. Full assessment to follow.  NG dual/vented Lumen 16 Fr.  tube in place with tip located in stomach per xray imaging.   Admitting Dx: Subdural hematoma (HCC) [S06.5XAA] Encephalopathy acute [G93.40] Warfarin toxicity, accidental or unintentional, initial encounter [T45.511A] Altered mental status, unspecified altered mental status type [R41.82]  Body mass index is 23.99 kg/m.  Labs:  Recent Labs  Lab 02/07/23 1844 02/07/23 1853 02/07/23 2027 02/08/23 0203 02/08/23 1107 02/08/23 1400 02/09/23 0256  NA 145 145   < > 144 144  --  141  K 4.6 4.6   < > 4.0 3.9  --  3.7  CL 113* 113*  --  112*  --   --  112*  CO2 23  --   --  22  --   --  25  BUN 22 24*  --  22  --   --  24*  CREATININE 1.94* 1.70*  --  1.77*  --   --  1.94*  CALCIUM 9.5  --   --  9.5  --   --  8.7*  MG  --   --   --   --   --  1.5* 1.4*  PHOS  --   --   --   --   --  2.3* 1.9*  GLUCOSE 131* 128*  --  121*  --   --  87   < > = values in this interval not displayed.    Jamelle Haring RDN, LDN Clinical Dietitian   If unable to reach, please contact "[facility] Inpatient" secure chat group between 8 am-4 pm daily"

## 2023-02-09 NOTE — Progress Notes (Signed)
eLink Physician-Brief Progress Note Patient Name: [name] DOB: Aug 22, 1945 MRN: [id number]   Date of Service  02/09/2023  HPI/Events of Note  K+ 3.7 Phos 1.9 Mg 1.4  eICU Interventions  Magnesium and K-Phos ordered     Intervention Category Minor Interventions: Electrolytes abnormality - evaluation and management  Kyndle Schlender 02/09/2023, 4:31 AM

## 2023-02-09 NOTE — Progress Notes (Addendum)
NAME:  [name], MRN:  [id number], DOB:  03-20-1945, LOS: 2 ADMISSION DATE:  02/07/2023, CONSULTATION DATE:  02/07/2023 REFERRING MD: Dr Rubin Payor, ED, CHIEF COMPLAINT:  AMS, code stroke   History of Present Illness:  Patient was brought into the hospital following being found unresponsive by her daughter, last known well 730 this morning She was found when daughter came back from work, difficulty breathing, EMS activated.  Was bradycardic, hypotensive on initial evaluation, pinpoint pupils, facial droop.  No response to Narcan Intubated in the ED for airway protection  Daughter is unclear whether she may have had a seizure  Had some chest congestion over the last week Was her usual self this morning when she was last seen   Pertinent  Medical History   Past Medical History:  Diagnosis Date   Acute CHF (congestive heart failure) (HCC) 12/14/2014   Hyperlipidemia    Hypertension    Prosthetic valve dysfunction 07/22/2014   Acute mitral stenosis due to restricted leaflet mobility of bileaflet mechanical mitral valve prosthesis   S/P MVR (mitral valve replacement) 04/11/1998   #29 St. 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  No mass effect, no midline shift.  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  Required intubation for airway protection.  Seen by neurosurgery felt not a candidate for surgery with small acute on chronic subdurals recommended against reversal of warfarin given concern about  basilar artery occlusion.  Seen by neurology, EEG ordered, started on Keppra.  Hypotensive, started on norepinephrine 2/1 EEG suggesting moderate to severe diffuse encephalopathy but no seizure or epileptiform discharges.  Repeat CT head ordered, and showing no new changes 2/2 eeg w areas of increased epileptogenicity + slowing. Off cont sedation   Interim History / Subjective:  Overnight some myoclonic movements   Objective   Blood pressure 97/68, pulse 86, temperature 98.4 F (36.9 C), resp. rate 17, weight 63.4 kg, SpO2 100%. CVP:  [3 mmHg-13 mmHg] 13 mmHg  Vent Mode: PSV;CPAP FiO2 (%):  [30 %] 30 % Set Rate:  [15 bmp] 15 bmp Vt Set:  [430 mL] 430 mL PEEP:  [5 cmH20] 5 cmH20 Pressure Support:  [8 cmH20-10 cmH20] 8 cmH20 Plateau Pressure:  [20 cmH20] 20 cmH20   Intake/Output Summary (Last 24 hours) at 02/09/2023 1400 Last data filed at 02/09/2023 1300 Gross per 24 hour  Intake 3275.21 ml  Output 1845 ml  Net 1430.21 ml   Filed Weights   02/08/23 0030 02/08/23 0500 02/09/23 0420  Weight: 60.4 kg 60.4 kg 63.4 kg    Examination:  General: WD elderly F intubated NAD  Neuro: pinpoint pupils, does not follow commands  HEENT: NCAT ETT secure EEG in place  Pulmonary: Symmetrical chest expansion with adequate Vt and minute ventilation on PSV. Scattered rhonchi  Cardiac: reg rate s1s2  Abdomen: soft ndnt  GU: foley  MSK: arms are edematous with some areas of erythema  Resolved Hospital Problem list    Hypothyroidism Lactic acidosis  Assessment & Plan:   Acute metabolic encephalopathy  Acute  SDH, s/p kcentra vir K (1/31)  Epileptocenicity L parieto-occipital region  Chronic vertebral artery occlusion Vascular dementia  P -cEEG -Keppra, will d/w neuro re possibly adding a second AED  -NSGY following, no acute intervention  -CT H is stable, will discuss timing of resuming anticoagulation (would do hep gtt no bolus when appropriate)  -off continuous sedation    Endotracheally intubated  Possible aspiraiton PNA  P -VAP, pulm hygiene -wean as able -rocephin  -will send an RVP   Hypotension  -coox argues against cardiogenic etiology  -no leukocytosis, temps elevated transiently on 2/1 only  P -cont rocephin  -follow cx data -wean NE for MAP > 65 -BNP pending, send another LA  -ECHO  -if unrevealing, consider an art line   Afib S/p bioprosthetic MVR Hx chronic systolic HF  -warfarin at home  Plan P -holding home meds  -cont cardiac monitoring -optimize lytes   CKD 3a  P -follow renal indices, UOP -renally dose meds  Hypomagnesemia Hypophosphatemia  P -replaced, follow   Anemia Thrombocytopenia  P -follow   Hypoglycemia Plan -EN  -d5 gtt -correct as needed   Upper extremity discoloration -possibly r/t prior PIV  P -central access -defer extremity ultrasound for now especially since big picture she needs anticoagulation (for Afib) regardless    Best Practice (right click and "Reselect all SmartList Selections" daily)   Diet/type: tubefeeds DVT prophylaxis SCD Pressure ulcer(s): N/A GI prophylaxis: PPI Lines: N/A Foley:  Yes, and it is still needed Code Status:  full code Last date of multidisciplinary goals of care discussion [discussed with daughter at bedside 2/2  CRITICAL CARE Performed by: Lanier Clam   Total critical care time:  43 minutes  Critical care time was exclusive of separately billable procedures and treating other patients. Critical care was necessary to treat or prevent imminent or life-threatening deterioration.  Critical care was time spent personally by me on the following activities: development of treatment plan with patient and/or surrogate as well as nursing, discussions with consultants, evaluation of patient's response to treatment, examination of patient, obtaining history from patient or surrogate, ordering and performing treatments and interventions, ordering and  review of laboratory studies, ordering and review of radiographic studies, pulse oximetry and re-evaluation of patient's condition.  Tessie Fass MSN, AGACNP-BC [facility] Amion for pager  02/09/2023, 2:00 PM

## 2023-02-09 NOTE — Progress Notes (Addendum)
NEUROLOGY CONSULT FOLLOW UP NOTE   Date of service: February 09, 2023 Patient Name: [name] MRN:  [id number] DOB:  November 17, 1945  Interval Hx/subjective  Patient has been somewhat hypotensive overnight with tmax of 100.4.  Sedation is off and she is unable to follow commands but grimaces and flexes BUE to sternal rub.  Considering age, hypotension and CKD, she may need some time for sedation to clear.  She did have some episodes of arm and leg jerking with stimuli last night and received Versed.  Awaiting LTM EEG read. Vitals   Vitals:   02/09/23 0530 02/09/23 0545 02/09/23 0600 02/09/23 0811  BP: (!) 83/64 91/66 112/71   Pulse: 78 78 77 78  Resp: 18 (!) 7 (!) 8 (!) 22  Temp: 98.8 F (37.1 C) 98.8 F (37.1 C) 99 F (37.2 C)   TempSrc:      SpO2: 98% 98% 99% 97%  Weight:         Body mass index is 23.99 kg/m.  Physical Exam   Constitutional: Intubated elderly patient in no acute distress Eyes: No scleral injection.  HENT: ET tube in place Head: Normocephalic.  Cardiovascular: Normal rate and regular rhythm.  Respiratory: Respirations synchronous with ventilator Skin: WDI.   Neurologic Examination   No sedation Pupils small but reactive to light, oculocephalic reflex absent, good corneal reflexes bilaterally, weak cough.  She does not respond to voice and is unable to follow commands.  Patient will grimace and flex bilateral upper extremities to sternal rub, does not move upper extremities to proximal pinch but does withdraw bilateral lower extremities to noxious.  Medications  Current Facility-Administered Medications:    cefTRIAXone (ROCEPHIN) 2 g in sodium chloride 0.9 % 100 mL IVPB, 2 g, Intravenous, Q24H, Cheri Fowler, MD, Stopped at 02/08/23 1658   Chlorhexidine Gluconate Cloth 2 % PADS 6 each, 6 each, Topical, Daily, Olalere, Adewale A, MD, 6 each at 02/08/23 1550   dextrose 5 % in lactated ringers infusion, , Intravenous, Continuous, [name],  NP, Stopped at 02/09/23 0548   docusate (COLACE) 50 MG/5ML liquid 100 mg, 100 mg, Per Tube, BID PRN, Olalere, Adewale A, MD   feeding supplement (PIVOT 1.5 CAL) liquid 1,000 mL, 1,000 mL, Per Tube, Q24H, [name], NP, Infusion Verify at 02/09/23 0900   feeding supplement (PROSource TF20) liquid 60 mL, 60 mL, Per Tube, Daily, [name], NP, 60 mL at 02/08/23 1210   fentaNYL (SUBLIMAZE) bolus via infusion 25-100 mcg, 25-100 mcg, Intravenous, Q15 min PRN, Benjiman Core, MD   fentaNYL (SUBLIMAZE) injection 25 mcg, 25 mcg, Intravenous, Q15 min PRN, [name], NP   fentaNYL (SUBLIMAZE) injection 25-100 mcg, 25-100 mcg, Intravenous, Q30 min PRN, [name], NP   levETIRAcetam (KEPPRA) IVPB 500 mg/100 mL premix, 500 mg, Intravenous, BID, Erick Blinks, MD, Stopped at 02/08/23 2119   midazolam (VERSED) injection 2 mg, 2 mg, Intravenous, Q1H PRN, [name], NP, 2 mg at 02/09/23 0023   norepinephrine (LEVOPHED) 4mg  in (0.016 mg/mL) premix infusion, 2-10 mcg/min, Intravenous, Titrated, Olalere, Adewale A, MD, Last Rate: 33.8 mL/hr at 02/09/23 0900, 9 mcg/min at 02/09/23 0900   Oral care mouth rinse, 15 mL, Mouth Rinse, Q2H, Olalere, Adewale A, MD, 15 mL at 02/09/23 0800   Oral care mouth rinse, 15 mL, Mouth Rinse, PRN, Olalere, Adewale A, MD   pantoprazole (PROTONIX) injection 40 mg, 40 mg, Intravenous, QHS, Olalere, Adewale A, MD, 40 mg at 02/08/23 2100  polyethylene glycol (MIRALAX / GLYCOLAX) packet 17 g, 17 g, Per Tube, Daily PRN, Olalere, Adewale A, MD   potassium PHOSPHATE 30 mmol in dextrose 5 % 500 mL infusion, 30 mmol, Intravenous, Once, Paliwal, Aditya, MD, Last Rate: 85 mL/hr at 02/09/23 0900, Infusion Verify at 02/09/23 0900  Labs and Diagnostic Imaging   CBC:  Recent Labs  Lab 02/07/23 1844 02/07/23 1853 02/08/23 0203 02/08/23 1107 02/09/23 0256  WBC 6.8  --  6.8  --  9.2  NEUTROABS 5.6  --   --   --   --   HGB 9.2*   < > 8.4* 9.9* 8.7*   HCT 32.0*   < > 28.6* 29.0* 29.1*  MCV 81.2  --  80.1  --  78.9*  PLT 120*  --  122*  --  151   < > = values in this interval not displayed.    Basic Metabolic Panel:  Lab Results  Component Value Date   NA 141 02/09/2023   K 3.7 02/09/2023   CO2 25 02/09/2023   GLUCOSE 87 02/09/2023   BUN 24 (H) 02/09/2023   CREATININE 1.94 (H) 02/09/2023   CALCIUM 8.7 (L) 02/09/2023   GFRNONAA 27 (L) 02/09/2023   GFRAA 42 (L) 03/14/2015   Lipid Panel:  Lab Results  Component Value Date   LDLCALC 85 08/24/2021   HgbA1c:  Lab Results  Component Value Date   HGBA1C 5.5 05/17/2016   Urine Drug Screen:     Component Value Date/Time   LABOPIA NONE DETECTED 02/07/2023 2058   COCAINSCRNUR NONE DETECTED 02/07/2023 2058   LABBENZ NONE DETECTED 02/07/2023 2058   AMPHETMU NONE DETECTED 02/07/2023 2058   THCU NONE DETECTED 02/07/2023 2058   LABBARB NONE DETECTED 02/07/2023 2058    Alcohol Level     Component Value Date/Time   ETH <10 02/07/2023 1844   INR  Lab Results  Component Value Date   INR 1.4 (H) 02/09/2023   APTT  Lab Results  Component Value Date   APTT 89 (H) 02/07/2023   AED levels: No results found for: "PHENYTOIN", "ZONISAMIDE", "LAMOTRIGINE", "LEVETIRACETA"  CT Head without contrast(Personally reviewed): New mixed density right subdural hematoma  Repeat head CT 2/1: Unchanged small right cerebral convexity subdural hematoma with no new abnormality, atrophy and multiple chronic infarcts  CT angio Head and Neck with contrast(Personally reviewed): Chronic occlusions of basilar and left vertebral arteries  MRI Brain(Personally reviewed): No acute infarct, similar appearance of right subdural hematoma  Continuous EEG 2/1:  Continuous slow, generalized, suggestive of moderate to severe diffuse encephalopathy with no seizures or epileptiform discharges.    Continuous EEG 2/2: Pending  Assessment   [name] is a 78 y.o. female with history of  hypertension, bioprosthetic mitral valve replacement, A-fib on warfarin and endocarditis who was found unresponsive yesterday.  On arrival to the ED, she was noted to have pinpoint pupils, right-sided hemiparesis and right gaze deviation.  She was intubated for airway protection.  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  MRI brain revealed no stroke.  Patient was given Ativan and loaded with Keppra.  Neurosurgery does not want to pursue surgical intervention on small subdural hematoma.  Given A-fib and mitral valve replacement, CCM team plans to restart anticoagulation with heparin possibly tomorrow.  On exam today without sedation, she will grimace and flex upper extremities to sternal rub.  Given age, CKD and  hypotension, she may need some time for sedation to fully clear.  She has had multiple events where she developed jerking movements of the extremities with stimulation overnight; will await LTM EEG read to determine if these are seizure activity or simply due to agitation.  Recommendations  - Continue Keppra 500 mg twice daily - Will discontinue EEG today if no seizure activity seen, multiple events flagged.  Will add second AED if abnormal movements are seizure activity - Continue seizure precautions - Management of subdural hematoma per neurosurgery - Anticoagulation per primary team, reasonable to wait another day ______________________________________________________________________  Patient seen by NP and then by MD, MD to edit note as needed. Signed, Cortney E Ernestina Columbia, NP [facility]    Attending Neurohospitalist Addendum Patient seen and examined with APP/Resident. Agree with the history and physical as documented above. Agree with the plan as documented, which I helped formulate. I have edited the note above to reflect my full findings and recommendations. I have  independently reviewed the chart, obtained history, review of systems and examined the patient.I have personally reviewed pertinent head/neck/spine imaging (CT/MRI). Please feel free to call with any questions.  cEEG results 2/1-02/09/23 This study showed evidence of epileptogenicity arising from  left parieto-occipital region. Additionally there is moderate to severe diffuse encephalopathy.    Event button was pressed on 02/09/2023 at 0738 for unclear reason. Concomitant eeg showed sharp waves in left parieto-occipital region which appeared qasi-periodic at 0.5 to 1hz . This is suggestive of increased risk of focal seizure from left parieto-occipital region.   Will continue current ESRD dosing of keppra. Would not add VPA at this time given SDH, need for anticoagulation going forward. If definitive seizures seen on EEG would add 2nd agent at that time.  This patient is critically ill and at significant risk of neurological worsening, death and care requires constant monitoring of vital signs, hemodynamics,respiratory and cardiac monitoring, neurological assessment, discussion with family, other specialists and medical decision making of high complexity. I spent 45 minutes of neurocritical care time  in the care of  this patient. This was time spent independent of any time provided by nurse practitioner or PA.  Bing Neighbors, MD [facility] [phone]  If 7pm- 7am, please page neurology on call as listed in AMION.

## 2023-02-09 NOTE — Progress Notes (Addendum)
    Providing Compassionate, Quality Care - Together   NEUROSURGERY PROGRESS NOTE     S: Myoclonic/agitation type activity o/n.    O: EXAM:  BP 112/71   Pulse 77   Temp 99 F (37.2 C)   Resp (!) 8   Wt 63.4 kg   LMP  (LMP Unknown)   SpO2 99%   BMI 23.99 kg/m   Intubated Pupils 2mm b/l, spontaneous opening Cough present Corneals present b/l No response BUE/BUE   ASSESSMENT:  78 y.o. with  -Acute on chronic R SDH -Chronic basilar/L vertebral artery occlusion -Severe diffuse encephalopathy as evidenced by EEG    PLAN: -Repeat CTH stable -Agree w/ goals of therapeutic INR with transition to heparin per primary team. -Call w/ questions/concerns.   Patrici Ranks, [facility]

## 2023-02-09 NOTE — Procedures (Signed)
Patient Name: [name]  MRN: [id number]  Epilepsy Attending: Charlsie Quest  Referring Physician/Provider: Erick Blinks, MD  Duration: 02/09/2023 0129 to 02/10/2023 0129   Patient history: 78 y.o. female who was found unresponsive by her daughter. Brougt in bradycardic, hypotensive with R gaze deviation, left sided weakness and obtunded mentation and intubated. She was found to have mixed density right SDH with no significant mass effect. EEG to evaluate for seizure   Level of alertness:  comatose/ lethargic    AEDs during EEG study: LEV   Technical aspects: This EEG study was done with scalp electrodes positioned according to the 10-20 International system of electrode placement. Electrical activity was reviewed with band pass filter of 1-70Hz , sensitivity of 7 uV/mm, display speed of 65mm/sec with a 60Hz  notched filter applied as appropriate. EEG data were recorded continuously and digitally stored.  Video monitoring was available and reviewed as appropriate.   Description: EEG showed near continuous generalized and lateralized right hemisphere 3 to 6 Hz theta-delta slowing admixed with 1-2 seconds of generalized attenuation. Sharp waves were noted in right>left parieto-occipital region Hyperventilation and photic stimulation were not performed.      Event button was pressed on 02/09/2023 at 0738 for unclear reason. Concomitant eeg showed sharp waves in right> left parieto-occipital region which appeared qasi-periodic at 0.5 to 1hz .   Event button was pressed on 02/09/2023 at 1504 for jerking in left upper extremity (difficult to visualize on camera as patient covered with multiple blankets). Concomitant eeg showed sharp waves in right > left parieto-occipital region without definite evolution.   ABNORMALITY - Sharp waves, right>left parieto-occipital region  - Continuous slow, generalized    IMPRESSION: This study showed evidence of epileptogenicity arising from right more  than left parieto-occipital region. Additionally there is cortical dysfunction in right hemisphere likely secondary to underlying subdural hematoma.  Lastly there is moderate to severe diffuse encephalopathy.    Event button was pressed on 02/09/2023 at 0738 for unclear reason. Concomitant eeg showed sharp waves in left parieto-occipital region which appeared qasi-periodic at 0.5 to 1hz . This is suggestive of increased risk of focal seizure from left parieto-occipital region.   Event button was pressed on 02/09/2023 at 1504 for jerking in left upper extremity (difficult to visualize on camera as patient covered with multiple blankets). Concomitant eeg showed sharp waves in right> left parieto-occipital region without definite evolution.  Given the semiology and underlying structural abnormalities, this was most likely a focal motor seizures which at times may not be seen on scalp EEG.  Bracy Pepper [name]

## 2023-02-09 NOTE — Progress Notes (Signed)
LTM maint complete - no skin breakdown under:  p7, p3, o1

## 2023-02-10 ENCOUNTER — Inpatient Hospital Stay (HOSPITAL_COMMUNITY): Payer: 59

## 2023-02-10 DIAGNOSIS — J9602 Acute respiratory failure with hypercapnia: Secondary | ICD-10-CM | POA: Diagnosis not present

## 2023-02-10 DIAGNOSIS — S065XAA Traumatic subdural hemorrhage with loss of consciousness status unknown, initial encounter: Secondary | ICD-10-CM | POA: Diagnosis not present

## 2023-02-10 DIAGNOSIS — J9601 Acute respiratory failure with hypoxia: Secondary | ICD-10-CM | POA: Diagnosis not present

## 2023-02-10 DIAGNOSIS — R4182 Altered mental status, unspecified: Secondary | ICD-10-CM

## 2023-02-10 DIAGNOSIS — G9340 Encephalopathy, unspecified: Secondary | ICD-10-CM | POA: Diagnosis not present

## 2023-02-10 DIAGNOSIS — R569 Unspecified convulsions: Secondary | ICD-10-CM | POA: Diagnosis not present

## 2023-02-10 DIAGNOSIS — J121 Respiratory syncytial virus pneumonia: Secondary | ICD-10-CM

## 2023-02-10 LAB — CBC
HCT: 26.7 % — ABNORMAL LOW (ref 36.0–46.0)
Hemoglobin: 8.1 g/dL — ABNORMAL LOW (ref 12.0–15.0)
MCH: 24 pg — ABNORMAL LOW (ref 26.0–34.0)
MCHC: 30.3 g/dL (ref 30.0–36.0)
MCV: 79 fL — ABNORMAL LOW (ref 80.0–100.0)
Platelets: 137 10*3/uL — ABNORMAL LOW (ref 150–400)
RBC: 3.38 MIL/uL — ABNORMAL LOW (ref 3.87–5.11)
RDW: 22.5 % — ABNORMAL HIGH (ref 11.5–15.5)
WBC: 9 10*3/uL (ref 4.0–10.5)
nRBC: 0.7 % — ABNORMAL HIGH (ref 0.0–0.2)

## 2023-02-10 LAB — CULTURE, RESPIRATORY W GRAM STAIN: Culture: NO GROWTH

## 2023-02-10 LAB — GLUCOSE, CAPILLARY
Glucose-Capillary: 125 mg/dL — ABNORMAL HIGH (ref 70–99)
Glucose-Capillary: 131 mg/dL — ABNORMAL HIGH (ref 70–99)
Glucose-Capillary: 110 mg/dL — ABNORMAL HIGH (ref 70–99)
Glucose-Capillary: 110 mg/dL — ABNORMAL HIGH (ref 70–99)
Glucose-Capillary: 91 mg/dL (ref 70–99)

## 2023-02-10 LAB — BASIC METABOLIC PANEL
Anion gap: 6 (ref 5–15)
BUN: 30 mg/dL — ABNORMAL HIGH (ref 8–23)
CO2: 26 mmol/L (ref 22–32)
Calcium: 8.6 mg/dL — ABNORMAL LOW (ref 8.9–10.3)
Chloride: 108 mmol/L (ref 98–111)
Creatinine, Ser: 1.92 mg/dL — ABNORMAL HIGH (ref 0.44–1.00)
GFR, Estimated: 27 mL/min — ABNORMAL LOW (ref >60)
Glucose, Bld: 127 mg/dL — ABNORMAL HIGH (ref 70–99)
Potassium: 3.6 mmol/L (ref 3.5–5.1)
Sodium: 140 mmol/L (ref 135–145)

## 2023-02-10 LAB — MAGNESIUM: Magnesium: 2 mg/dL (ref 1.7–2.4)

## 2023-02-10 MED ORDER — OSMOLITE 1.2 CAL PO LIQD
1000.0000 mL | ORAL | Status: DC
Start: 2023-02-10 — End: 2023-02-17
  Administered 2023-02-10 – 2023-02-17 (×6): 1000 mL
  Filled 2023-02-10 (×10): qty 1000

## 2023-02-10 MED ORDER — VALPROATE SODIUM 100 MG/ML IV SOLN
325.0000 mg | Freq: Three times a day (TID) | INTRAVENOUS | Status: DC
  Administered 2023-02-10 – 2023-02-22 (×37): 325 mg via INTRAVENOUS
  Filled 2023-02-10 (×43): qty 3.25

## 2023-02-10 MED ORDER — SODIUM CHLORIDE 0.9 % IV SOLN: INTRAVENOUS | Status: DC | PRN

## 2023-02-10 MED ORDER — VALPROATE SODIUM 100 MG/ML IV SOLN
1200.0000 mg | Freq: Once | INTRAVENOUS | Status: AC
  Administered 2023-02-10: 1200 mg via INTRAVENOUS
  Filled 2023-02-10: qty 12

## 2023-02-10 NOTE — Progress Notes (Addendum)
NAME:  [name], MRN:  [id number], DOB:  12-20-1945, LOS: 3 ADMISSION DATE:  02/07/2023, CONSULTATION DATE:  02/07/2023 REFERRING MD: Dr Rubin Payor, ED, CHIEF COMPLAINT:  AMS, code stroke   History of Present Illness:  Patient was brought into the hospital following being found unresponsive by her daughter, last known well 730 this morning She was found when daughter came back from work, difficulty breathing, EMS activated.  Was bradycardic, hypotensive on initial evaluation, pinpoint pupils, facial droop.  No response to Narcan Intubated in the ED for airway protection  Daughter is unclear whether she may have had a seizure  Had some chest congestion over the last week Was her usual self this morning when she was last seen   Pertinent  Medical History   Past Medical History:  Diagnosis Date   Acute CHF (congestive heart failure) (HCC) 12/14/2014   Hyperlipidemia    Hypertension    Prosthetic valve dysfunction 07/22/2014   Acute mitral stenosis due to restricted leaflet mobility of bileaflet mechanical mitral valve prosthesis   S/P MVR (mitral valve replacement) 04/11/1998   #29 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  No mass effect, no midline shift.  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  Required intubation for airway protection.  Seen by neurosurgery felt not a candidate for surgery with small acute on chronic subdurals recommended against reversal of warfarin given concern about  basilar artery occlusion.  Seen by neurology, EEG ordered, started on Keppra.  Hypotensive, started on norepinephrine 2/1 EEG suggesting moderate to severe diffuse encephalopathy but no seizure or epileptiform discharges.  Repeat CT head ordered, and showing no new changes 2/2 eeg w areas of increased epileptogenicity + slowing. Off cont sedation. ECHO  w MV gradient change and decr EF 2/3  + RSV   Interim History / Subjective:  NE is weaning   Objective   Blood pressure (!) 133/58, pulse 79, temperature 98.4 F (36.9 C), resp. rate 17, height 5\' 4"  (1.626 m), weight 63.4 kg, SpO2 98%. CVP:  [6 mmHg-16 mmHg] 6 mmHg  Vent Mode: PRVC FiO2 (%):  [30 %-40 %] 30 % Set Rate:  [15 bmp] 15 bmp Vt Set:  [430 mL] 430 mL PEEP:  [5 cmH20] 5 cmH20 Pressure Support:  [8 cmH20] 8 cmH20 Plateau Pressure:  [16 cmH20-18 cmH20] 18 cmH20   Intake/Output Summary (Last 24 hours) at 02/10/2023 1237 Last data filed at 02/10/2023 1030 Gross per 24 hour  Intake 1694.98 ml  Output 2290 ml  Net -595.02 ml   Filed Weights   02/08/23 0500 02/09/23 0420 02/09/23 1653  Weight: 60.4 kg 63.4 kg 63.4 kg   Examination:  General: WD critically ill appearing elderly F intubated  Neuro: Opens eyes to verbal stimulation, grimace to pain. + cough. Does not follow commands  HEENT: ETT secure. EEG in place. Some periorbital edema  Pulmonary: Symmetrical chest expansion. Scattered rhonchi  Cardiac: s1s2  cap refill < 3 sec  Abdomen: soft ndnt  GU: foley  MSK: edematous upper arms  Resolved Hospital Problem list    Hypothyroidism Lactic acidosis  Assessment & Plan:   Acute encephalopathy Acute SDH s/p kcentra and Vit K  Possible seizure PTA Chronic vertebral artery occlusion Vascular dementia  -repeat CT H is stable  P -cEEG -Cont Keppra-- d/w neuro adding VPA 2/3. Was deferred w cEEG w/o sz. Has been added 2/3  -neuro and NSGY are both following   -NSGY following, no acute intervention  -defer  restarting systemic AC for now -- when we do, will do hep gtt no bolus -cont off sedation   Acute respiratory failure RSV infection Possible aspiration PNA P -droplet  -VAP, pulm hygiene -wean as able -rocephin   Hypotension  -coox argues against cardiogenic etiology  -no leukocytosis, temps elevated transiently on 2/1 only  -LA and great UOP both argue against this truly being shock  P -rocephin as above -trying different NIBP sites,  consider an art line if consistently low NIBP reads   Afib on warfarin at home RBBB S/p bioprosthetic MVR, moderate mitral stenosis  AoC systolic HF  -warfarin at home  -ECHO 2/2 -- LVEF 45-50, LV global hypokinesis, moderate Mitral stenosis and mean mitral valve flow gradient of  Plan P -holding home meds  -cont cardiac monitoring -optimize lytes  -cards consult when we have a better grasp of neuro status -- would need TEE   CKD 3a  P -follow renal indices, UOP -renally dose meds  Anemia Thrombocytopenia  P -follow PRN   Upper extremity discoloration -suspect  r/t prior PIVs -- very fragile vasculature  -central access -defer extremity ultrasound for now especially since big picture she needs anticoagulation (for Afib) regardless    Best Practice (right click and "Reselect all SmartList Selections" daily)   Diet/type: tubefeeds DVT prophylaxis SCD Pressure ulcer(s): N/A GI prophylaxis: PPI Lines: N/A Foley:  Yes, and it is still needed Code Status:  full code Last date of multidisciplinary goals of care discussion [discussed with daughter at bedside 2/3   CRITICAL CARE Performed by: Lanier Clam   Total critical care time: 48 minutes  Critical care time was exclusive of separately billable procedures and treating other patients. Critical care was necessary to treat or prevent imminent or life-threatening deterioration.  Critical care was time spent personally by me on the following activities: development  of treatment plan with patient and/or surrogate as well as nursing, discussions with consultants, evaluation of patient's response to treatment, examination of patient, obtaining history from patient or surrogate, ordering and performing treatments and interventions, ordering and review of laboratory studies, ordering and review of radiographic studies, pulse oximetry and re-evaluation of patient's condition.  Tessie Fass MSN, AGACNP-BC [facility] Pulmonary/Critical Care Medicine Amion for pager 02/10/2023, 12:37 PM

## 2023-02-10 NOTE — Procedures (Signed)
Patient Name: [name]  MRN: [id number]  Epilepsy Attending: Charlsie Quest  Referring Physician/Provider: Erick Blinks, MD  Duration: 02/10/2023 0129 to 02/11/2023 0129   Patient history: 78 y.o. female who was found unresponsive by her daughter. Brougt in bradycardic, hypotensive with R gaze deviation, left sided weakness and obtunded mentation and intubated. She was found to have mixed density right SDH with no significant mass effect. EEG to evaluate for seizure   Level of alertness:  comatose/ lethargic , asleep   AEDs during EEG study: LEV, VPA   Technical aspects: This EEG study was done with scalp electrodes positioned according to the 10-20 International system of electrode placement. Electrical activity was reviewed with band pass filter of 1-70Hz , sensitivity of 7 uV/mm, display speed of 79mm/sec with a 60Hz  notched filter applied as appropriate. EEG data were recorded continuously and digitally stored.  Video monitoring was available and reviewed as appropriate.   Description: EEG showed near continuous generalized and lateralized right hemisphere 3 to 6 Hz theta-delta slowing admixed with 1-2 seconds of generalized attenuation. Sleep was characterized by vertex waves, sleep spindles (12 to 14 Hz), maximal frontocentral region. Sharp waves were noted in right>left parieto-occipital region    Event button was pressed on 02/10/2023 at 0221 for eye blinking. Concomitant eeg showed sharp waves in right > left parieto-occipital region without definite evolution.    ABNORMALITY - Sharp waves, right>left parieto-occipital region  - Continuous slow, generalized and lateralized right hemisphere   IMPRESSION: This study showed evidence of epileptogenicity arising from right more than left parieto-occipital region. Additionally there is cortical dysfunction in right hemisphere likely secondary to underlying subdural hematoma.  Lastly there is moderate to severe diffuse  encephalopathy.   Event button was pressed on 02/10/2023 at 0221 for eye blinking. Concomitant eeg showed sharp waves in right > left parieto-occipital region without definite evolution. However, focal motor seizures may not be seen on scalp EEG.  Therefore clinical correlation is recommended   Veola Cafaro [name]

## 2023-02-10 NOTE — Progress Notes (Signed)
 LTM maint complete - no skin breakdown under:  fp1,fp2,a1

## 2023-02-10 NOTE — Progress Notes (Signed)
Initial Nutrition Assessment  DOCUMENTATION CODES:   Not applicable  INTERVENTION:   Tube Feeding via OG:  Osmolite 1.2 at 55 ml/hr TF at goal provides 73 g of protein, 1584 kcals and 1069 mL of free water  Recommend free water flush of 150 mL q 6 hours to meet hydration needs  NUTRITION DIAGNOSIS:   Inadequate oral intake related to acute illness as evidenced by NPO status.  GOAL:   Patient will meet greater than or equal to 90% of their needs  MONITOR:   Vent status, Labs, Weight trends, TF tolerance  REASON FOR ASSESSMENT:   Consult Enteral/tube feeding initiation and management (Trickle start)  ASSESSMENT:   78 yo female admitted after being found unresponsive by daughter, bradycardic and hypotensive but did not lose pulse, intubated for airway protection, possible seizure.  PMH includes HTN, HLD, stroke, mitral stenosis requiring MVR, CKD 3, vascular dementia  1/31 CT head with R SDH,  MRI: no evidence of acute infarct, right SDH-no mass effect, no midline shift  Pt remains on vent support, remains encephalopathic off sedation x 48 hours. Marland Kitchen EEG with no active seizures but epileptogenicity, severe diffuse encephalopathy. RSV +  OG tube with tip in stomach per imaging report Pivot 1.5 at 30 ml/hr with Pro-Source 60 mL daily via OG  Labs: BUN 30, Creatinine 1.92, sodium 140 (wdl), potassium 3.6 (wdl) Meds: reviewed  Diet Order:   Diet Order             Diet NPO time specified  Diet effective now                   EDUCATION NEEDS:   Not appropriate for education at this time  Skin:     Last BM:  2/02 type 6/7 small, no other stool since admission.  Height:   Ht Readings from Last 1 Encounters:  02/09/23 5\' 4"  (1.626 m)    Weight:   Wt Readings from Last 1 Encounters:  02/09/23 63.4 kg     BMI:  Body mass index is 23.99 kg/m.  Estimated Nutritional Needs:   Kcal:  1450-1650 kcals  Protein:  70-85 g  Fluid:  >/= 1.5 L   Romelle Starcher MS, RDN, LDN, CNSC Registered Dietitian 3 Clinical Nutrition RD Inpatient Contact Info in Amion

## 2023-02-10 NOTE — TOC Initial Note (Signed)
Transition of Care [facility]) - Initial/Assessment Note    Patient Details  Name: [name] MRN: [id number] Date of Birth: [birth year]  Transition of Care [facility]) CM/SW Contact:    Elliot Cousin, RN Phone Number: [phone] 02/10/2023, 6:40 PM  Clinical Narrative:                 TOC CM spoke to pt's dtr, states pt was independent pta. Lives at home with dtr. She has a cane if she needs.  Will need PT evaluation and recommendations.  Will continue to follow for dc needs.   Expected Discharge Plan: IP Rehab Facility Barriers to Discharge: Continued Medical Work up   Patient Goals and CMS Choice            Expected Discharge Plan and Services   Discharge Planning Services: CM Consult   Living arrangements for the past 2 months: Single Family Home                                      Prior Living Arrangements/Services Living arrangements for the past 2 months: Single Family Home Lives with:: Adult Children Patient language and need for interpreter reviewed:: Yes Do you feel safe going back to the place where you live?: Yes      Need for Family Participation in Patient Care: Yes (Comment) Care giver support system in place?: Yes (comment) Current home services: DME (cane) Criminal Activity/Legal Involvement Pertinent to Current Situation/Hospitalization: No - Comment as needed  Activities of Daily Living   ADL Screening (condition at time of admission) Independently performs ADLs?: No Does the patient have a NEW difficulty with bathing/dressing/toileting/self-feeding that is expected to last >3 days?: Yes (Initiates electronic notice to provider for possible OT consult) Does the patient have a NEW difficulty with getting in/out of bed, walking, or climbing stairs that is expected to last >3 days?: Yes (Initiates electronic notice to provider for possible PT consult) Does the patient have a NEW difficulty with communication that is expected to last  >3 days?: Yes (Initiates electronic notice to provider for possible SLP consult) Is the patient deaf or have difficulty hearing?: No Does the patient have difficulty seeing, even when wearing glasses/contacts?: No Does the patient have difficulty concentrating, remembering, or making decisions?: Yes  Permission Sought/Granted Permission sought to share information with : Case Manager, Family Supports, PCP Permission granted to share information with : Yes, Verbal Permission Granted  Share Information with NAME: [name]     Permission granted to share info w Relationship: daughter  Permission granted to share info w Contact Information: [phone]  Emotional Assessment   Attitude/Demeanor/Rapport: Intubated (Following Commands or Not Following Commands)          Admission diagnosis:  Subdural hematoma (HCC) [S06.5XAA] Encephalopathy acute [G93.40] Warfarin toxicity, accidental or unintentional, initial encounter [T45.511A] Altered mental status, unspecified altered mental status type [R41.82] Patient Active Problem List   Diagnosis Date Noted   RSV (respiratory syncytial virus pneumonia) 02/10/2023   Altered mental status 02/10/2023   Subdural hematoma (HCC) 02/09/2023   Hypotension 02/09/2023   Acute respiratory failure (HCC) 02/09/2023   Encephalopathy acute 02/07/2023   Submandibular lymphadenopathy 01/20/2023   Encounter for general adult medical examination with abnormal findings 10/27/2020   Neck pain 07/05/2017   Seasonal allergic rhinitis 10/04/2016   Chronic gout 08/03/2016   Vascular dementia, uncomplicated (HCC) 05/17/2016  Chest congestion 05/06/2016   S/P mitral valve replacement with bioprosthetic valve 04/25/2015   Essential hypertension 04/25/2015   Chronic anticoagulation 04/25/2015   Intracranial vascular stenosis    Hyperlipidemia    Chronic systolic CHF (congestive heart failure) (HCC) 01/04/2015   CKD (chronic kidney disease) stage 3, GFR  30-59 ml/min (HCC) 01/04/2015   Atrial fibrillation (HCC) 09/22/2014   RBBB 03/11/2014   PCP:  Myrlene Broker, MD Pharmacy:   [facility] [address] [place] [address] Phone: [phone] Fax: [phone]     Social Drivers of Health (SDOH) Social History: SDOH Screenings   Food Insecurity: No Food Insecurity (02/09/2023)  Housing: Low Risk  (02/09/2023)  Transportation Needs: No Transportation Needs (02/09/2023)  Utilities: Not At Risk (02/09/2023)  Alcohol Screen: Low Risk  (09/02/2022)  Depression (PHQ2-9): Low Risk  (01/20/2023)  Financial Resource Strain: Low Risk  (09/02/2022)  Physical Activity: Insufficiently Active (09/02/2022)  Social Connections: Socially Isolated (02/09/2023)  Stress: No Stress Concern Present (09/02/2022)  Tobacco Use: Medium Risk (02/07/2023)  Health Literacy: Adequate Health Literacy (09/02/2022)   SDOH Interventions:     Readmission Risk Interventions     No data to display

## 2023-02-10 NOTE — Progress Notes (Addendum)
NEUROLOGY CONSULT FOLLOW UP NOTE   Date of service: February 10, 2023 Patient Name: [name] MRN:  [id number] DOB:  05-28-1945  Interval Hx/subjective  Daughter at bedside. Pt with no response to voice but some response to sternal rub. Per daughter, she turns her head when she calls her.   More hx obtained from daughter who reported she found her mother on the floor in a confused state. She ambulates at baseline and does fall often. Last fall was one month prior. She noted pt had bowel incontinence when she found her. She also had her right arm out of the sleeve of her dress, suggesting that possibly she had been dressing/undressing at the time of symptom onset and that symptom onset may have been sudden.    Vitals  Hypotensive blood pressure, pt on levophed. Afebrile.  Vitals:   02/10/23 0730 02/10/23 0745 02/10/23 0800 02/10/23 0801  BP: (!) 90/56 (!) 87/64 (!) 86/63 (!) 87/64  Pulse: 80 80  84  Resp: 15 14  12   Temp: 98.2 F (36.8 C) 98.4 F (36.9 C)  98.4 F (36.9 C)  TempSrc:      SpO2: 98% 98%  99%  Weight:      Height:         Body mass index is 23.99 kg/m.  Physical Exam   Constitutional: Intubated elderly patient in no acute distress Eyes: No scleral injection.  HENT: ET tube in place Head: Normocephalic.  Cardiovascular: Normal rate and regular rhythm.  Respiratory: Respirations synchronous with ventilator Skin: WDI.   Neurologic Examination   Ment: Not on any sedation. She does not respond to voice and is unable to follow commands. However, when daughter speaks to the patient, the patient does open eyes and makes eye contact. Localizes slowly to sustained sternal rub.   CN: Pupils small but reactive to light, oculocephalic reflex present, good corneal reflexes bilaterally, gag reflex present.   Motor/Sensory: Patient will grimace and flex upper extremities to sternal rub. Does not grip hands. She does have weak withdrawal of BLE to noxious plantar  stimulation.  Reflexes: 3+ in biceps and brachioradialis bilaterally. 1+ left patellar and no reflex right patellar. Babinski positive on the left. Cerebellar/Gait: Unable to assess  Medications  Current Facility-Administered Medications:    cefTRIAXone (ROCEPHIN) 2 g in sodium chloride 0.9 % 100 mL IVPB, 2 g, Intravenous, Q24H, Cheri Fowler, MD, Stopped at 02/09/23 1638   Chlorhexidine Gluconate Cloth 2 % PADS 6 each, 6 each, Topical, Daily, Olalere, Adewale A, MD, 6 each at 02/09/23 1059   docusate (COLACE) 50 MG/5ML liquid 100 mg, 100 mg, Per Tube, BID PRN, Olalere, Adewale A, MD   feeding supplement (PIVOT 1.5 CAL) liquid 1,000 mL, 1,000 mL, Per Tube, Q24H, [name], NP, Infusion Verify at 02/10/23 0800   feeding supplement (PROSource TF20) liquid 60 mL, 60 mL, Per Tube, Daily, [name], NP, 60 mL at 02/09/23 1059   fentaNYL (SUBLIMAZE) bolus via infusion 25-100 mcg, 25-100 mcg, Intravenous, Q15 min PRN, Benjiman Core, MD   fentaNYL (SUBLIMAZE) injection 25 mcg, 25 mcg, Intravenous, Q15 min PRN, [name], NP   fentaNYL (SUBLIMAZE) injection 25-100 mcg, 25-100 mcg, Intravenous, Q30 min PRN, [name], NP, 50 mcg at 02/10/23 0416   influenza vaccine adjuvanted (FLUAD) injection 0.5 mL, 0.5 mL, Intramuscular, Prior to discharge, Cheri Fowler, MD   levETIRAcetam (KEPPRA) IVPB 500 mg/100 mL premix, 500 mg, Intravenous, BID, Erick Blinks, MD, Stopped at 02/09/23  2205   midazolam (VERSED) injection 2 mg, 2 mg, Intravenous, Q1H PRN, [name], NP, 2 mg at 02/09/23 0023   norepinephrine (LEVOPHED) 4mg  in (0.016 mg/mL) premix infusion, 0-40 mcg/min, Intravenous, Titrated, Bowser, Kaylyn Layer, NP, Last Rate: 22.5 mL/hr at 02/10/23 0800, 6 mcg/min at 02/10/23 0800   Oral care mouth rinse, 15 mL, Mouth Rinse, Q2H, Olalere, Adewale A, MD, 15 mL at 02/10/23 0735   Oral care mouth rinse, 15 mL, Mouth Rinse, PRN, Olalere, Adewale A, MD   pantoprazole  (PROTONIX) injection 40 mg, 40 mg, Intravenous, QHS, Olalere, Adewale A, MD, 40 mg at 02/09/23 2150   pneumococcal 20-valent conjugate vaccine (PREVNAR 20) injection 0.5 mL, 0.5 mL, Intramuscular, Prior to discharge, Cheri Fowler, MD   polyethylene glycol (MIRALAX / GLYCOLAX) packet 17 g, 17 g, Per Tube, Daily PRN, Virl Diamond A, MD  Labs and Diagnostic Imaging   CBC:  Recent Labs  Lab 02/07/23 1844 02/07/23 1853 02/09/23 0256 02/10/23 0308  WBC 6.8   < > 9.2 9.0  NEUTROABS 5.6  --   --   --   HGB 9.2*   < > 8.7* 8.1*  HCT 32.0*   < > 29.1* 26.7*  MCV 81.2   < > 78.9* 79.0*  PLT 120*   < > 151 137*   < > = values in this interval not displayed.    Basic Metabolic Panel:  Lab Results  Component Value Date   NA 140 02/10/2023   K 3.6 02/10/2023   CO2 26 02/10/2023   GLUCOSE 127 (H) 02/10/2023   BUN 30 (H) 02/10/2023   CREATININE 1.92 (H) 02/10/2023   CALCIUM 8.6 (L) 02/10/2023   GFRNONAA 27 (L) 02/10/2023   GFRAA 42 (L) 03/14/2015   Lipid Panel:  Lab Results  Component Value Date   LDLCALC 85 08/24/2021   HgbA1c:  Lab Results  Component Value Date   HGBA1C 5.5 05/17/2016   Urine Drug Screen:     Component Value Date/Time   LABOPIA NONE DETECTED 02/07/2023 2058   COCAINSCRNUR NONE DETECTED 02/07/2023 2058   LABBENZ NONE DETECTED 02/07/2023 2058   AMPHETMU NONE DETECTED 02/07/2023 2058   THCU NONE DETECTED 02/07/2023 2058   LABBARB NONE DETECTED 02/07/2023 2058    Alcohol Level     Component Value Date/Time   ETH <10 02/07/2023 1844   INR  Lab Results  Component Value Date   INR 1.5 (H) 02/09/2023   APTT  Lab Results  Component Value Date   APTT 89 (H) 02/07/2023    CT Head without contrast (Personally reviewed): New mixed density right subdural hematoma  Repeat head CT 2/1: Unchanged small right cerebral convexity subdural hematoma with no new abnormality, atrophy and multiple chronic infarcts  CT angio Head and Neck with  contrast: Chronic occlusions of basilar and left vertebral arteries  MRI Brain: No acute infarct, similar appearance of right subdural hematoma  Continuous EEG 2/1:  Continuous slow, generalized, suggestive of moderate to severe diffuse encephalopathy with no seizures or epileptiform discharges.    Continuous EEG 2/3: Study showed evidence of epileptogenicity arising from right more than left parieto-occipital region. Additionally there is cortical dysfunction in right hemisphere likely secondary to underlying subdural hematoma. Lastly there is moderate to severe diffuse encephalopathy.   Assessment  [name] is a 78 y.o. female with a history of vascular dementia, hypertension, bioprosthetic mitral valve replacement, A-fib on warfarin and endocarditis who was found unresponsive 01/31.  On  arrival to the ED, she was noted to have pinpoint pupils, right-sided hemiparesis and right gaze deviation.  She was intubated for airway protection.  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  MRI brain revealed no stroke.  Patient was given Ativan and loaded with Keppra.  Neurosurgery felt that the small size of the subdural hematoma does not warrant surgical intervention.   - Pt's MRI was done on 01/31 and did not show any new infarcts.  - DDx:  - Overall, her encephalopathy appears to be multifactorial in nature with seizure and postictal state being the most likely etiology for her encephalopathy.  - Her symptoms could also be secondary to concussion from a possible fall that resulted in the SDH.  - She was on fentanyl for sedation that was stopped as continuous infusion on 02/01, but she is getting fentanyl pushes which could be contributing to her continuing encephalopathy.  - A severe hospital delirium is another possible complicating factor with her hx of vascular dementia.  - Will need to  add second AED as EEG is showing seizure activity. Will recommend minimizing any sedating medications to allow her to wake up more. Continue delirium precautions for her.    Recommendations  - Continue Keppra 500 mg twice daily - Given persistent epileptogenicity on EEG, adding Depakote (1200 mg loading dose then 5 mg/kg TID) to her Keppra. She is already on maximum-dose Keppra for her renal function.  - Continue seizure precautions -- Continue LTM EEG and monitor response to added Depakote - Minimize sedating meds - Management of subdural hematoma per neurosurgery - Given A-fib and mitral valve replacement, CCM team plans to restart anticoagulation with heparin. Will need to be closely monitored while on anticoagulation due to risk of enlargement of her subdural hematoma. As the subdural was most likely traumatic rather than spontaneous, risk/benefit ratio favors anticoagulation.  _____________________________________________________________  Gwenevere Abbot, MD Eligha Bridegroom. [facility] Internal Medicine Residency, PGY-3  I have seen and examined the patient. I have formulated the assessment and recommendations.  Electronically signed: Dr. Caryl Pina

## 2023-02-11 ENCOUNTER — Inpatient Hospital Stay (HOSPITAL_COMMUNITY): Payer: 59

## 2023-02-11 DIAGNOSIS — R4182 Altered mental status, unspecified: Secondary | ICD-10-CM | POA: Diagnosis not present

## 2023-02-11 DIAGNOSIS — T45511A Poisoning by anticoagulants, accidental (unintentional), initial encounter: Secondary | ICD-10-CM | POA: Diagnosis not present

## 2023-02-11 DIAGNOSIS — R569 Unspecified convulsions: Secondary | ICD-10-CM | POA: Diagnosis not present

## 2023-02-11 DIAGNOSIS — G9340 Encephalopathy, unspecified: Secondary | ICD-10-CM | POA: Diagnosis not present

## 2023-02-11 DIAGNOSIS — S065XAA Traumatic subdural hemorrhage with loss of consciousness status unknown, initial encounter: Secondary | ICD-10-CM | POA: Diagnosis not present

## 2023-02-11 DIAGNOSIS — J9601 Acute respiratory failure with hypoxia: Secondary | ICD-10-CM | POA: Diagnosis not present

## 2023-02-11 LAB — CBC
HCT: 24.5 % — ABNORMAL LOW (ref 36.0–46.0)
Hemoglobin: 7.3 g/dL — ABNORMAL LOW (ref 12.0–15.0)
MCH: 23.3 pg — ABNORMAL LOW (ref 26.0–34.0)
MCHC: 29.8 g/dL — ABNORMAL LOW (ref 30.0–36.0)
MCV: 78.3 fL — ABNORMAL LOW (ref 80.0–100.0)
Platelets: 122 10*3/uL — ABNORMAL LOW (ref 150–400)
RBC: 3.13 MIL/uL — ABNORMAL LOW (ref 3.87–5.11)
RDW: 22.2 % — ABNORMAL HIGH (ref 11.5–15.5)
WBC: 6.2 10*3/uL (ref 4.0–10.5)
nRBC: 0.6 % — ABNORMAL HIGH (ref 0.0–0.2)

## 2023-02-11 LAB — BASIC METABOLIC PANEL
Anion gap: 7 (ref 5–15)
BUN: 35 mg/dL — ABNORMAL HIGH (ref 8–23)
CO2: 26 mmol/L (ref 22–32)
Calcium: 8.3 mg/dL — ABNORMAL LOW (ref 8.9–10.3)
Chloride: 109 mmol/L (ref 98–111)
Creatinine, Ser: 1.74 mg/dL — ABNORMAL HIGH (ref 0.44–1.00)
GFR, Estimated: 30 mL/min — ABNORMAL LOW (ref >60)
Glucose, Bld: 92 mg/dL (ref 70–99)
Potassium: 3.7 mmol/L (ref 3.5–5.1)
Sodium: 142 mmol/L (ref 135–145)

## 2023-02-11 LAB — GLUCOSE, CAPILLARY
Glucose-Capillary: 100 mg/dL — ABNORMAL HIGH (ref 70–99)
Glucose-Capillary: 95 mg/dL (ref 70–99)
Glucose-Capillary: 95 mg/dL (ref 70–99)
Glucose-Capillary: 100 mg/dL — ABNORMAL HIGH (ref 70–99)

## 2023-02-11 MED ORDER — POTASSIUM CHLORIDE 20 MEQ PO PACK
40.0000 meq | PACK | Freq: Once | ORAL | Status: AC
  Administered 2023-02-11: 40 meq
  Filled 2023-02-11: qty 2

## 2023-02-11 MED ORDER — ATORVASTATIN CALCIUM 40 MG PO TABS
40.0000 mg | ORAL_TABLET | Freq: Every day | ORAL | Status: DC
  Administered 2023-02-11 – 2023-02-16 (×6): 40 mg
  Filled 2023-02-11 (×6): qty 1

## 2023-02-11 MED ORDER — DONEPEZIL HCL 10 MG PO TABS
10.0000 mg | ORAL_TABLET | Freq: Every day | ORAL | Status: DC
  Administered 2023-02-11 – 2023-02-16 (×6): 10 mg
  Filled 2023-02-11 (×6): qty 1

## 2023-02-11 MED ORDER — MODAFINIL 100 MG PO TABS
200.0000 mg | ORAL_TABLET | Freq: Every day | ORAL | Status: DC
  Administered 2023-02-11 – 2023-02-16 (×6): 200 mg
  Filled 2023-02-11 (×6): qty 2

## 2023-02-11 NOTE — Procedures (Addendum)
 Patient Name: [name]  MRN: [id number]  Epilepsy Attending: Arlin MALVA Krebs  Referring Physician/Provider: Khaliqdina, Salman, MD  Duration: 02/11/2023 0129 to 02/11/2023 1046   Patient history: 78 y.o. female who was found unresponsive by her daughter. Brougt in bradycardic, hypotensive with R gaze deviation, left sided weakness and obtunded mentation and intubated. She was found to have mixed density right SDH with no significant mass effect. EEG to evaluate for seizure   Level of alertness:  comatose/ lethargic , asleep   AEDs during EEG study: LEV, VPA   Technical aspects: This EEG study was done with scalp electrodes positioned according to the 10-20 International system of electrode placement. Electrical activity was reviewed with band pass filter of 1-70Hz , sensitivity of 7 uV/mm, display speed of 73mm/sec with a 60Hz  notched filter applied as appropriate. EEG data were recorded continuously and digitally stored.  Video monitoring was available and reviewed as appropriate.   Description: EEG showed near continuous generalized and lateralized right hemisphere 3 to 6 Hz theta-delta slowing. Sleep was characterized by vertex waves, sleep spindles (12 to 14 Hz), maximal frontocentral region. Sharp waves were noted in right>left parieto-occipital region    ABNORMALITY - Sharp waves, right>left parieto-occipital region  - Continuous slow, generalized and lateralized right hemisphere   IMPRESSION: This study showed evidence of epileptogenicity arising from right more than left parieto-occipital region. Additionally there is cortical dysfunction in right hemisphere likely secondary to underlying subdural hematoma.  Lastly there is moderate to severe diffuse encephalopathy. No seizures were noted.    Gal Smolinski O Joby Hershkowitz

## 2023-02-11 NOTE — Progress Notes (Signed)
 eLink Physician-Brief Progress Note Patient Name: [name] DOB: 03/20/1945 MRN: [id number]   Date of Service  02/11/2023  HPI/Events of Note  78 year old female here with acute encephalopathy likely in the setting of seizures, noted to have acute on chronic right parieto-occipital subdural hemorrhage   eICU Interventions  Foley catheter in place, renew order for now     Intervention Category Minor Interventions: Routine modifications to care plan (e.g. PRN medications for pain, fever)  Luisana Lutzke 02/11/2023, 3:16 AM

## 2023-02-11 NOTE — Progress Notes (Signed)
 NAME:  [name], MRN:  [id number], DOB:  04/26/1945, LOS: 4 ADMISSION DATE:  02/07/2023, CONSULTATION DATE:  02/07/2023 REFERRING MD: Dr Patsey, ED, CHIEF COMPLAINT:  AMS, code stroke   History of Present Illness:  Patient was brought into the hospital following being found unresponsive by her daughter, last known well 730 this morning She was found when daughter came back from work, difficulty breathing, EMS activated.  Was bradycardic, hypotensive on initial evaluation, pinpoint pupils, facial droop.  No response to Narcan Intubated in the ED for airway protection  Daughter is unclear whether she may have had a seizure  Had some chest congestion over the last week Was her usual self this morning when she was last seen   Pertinent  Medical History   Past Medical History:  Diagnosis Date   Acute CHF (congestive heart failure) (HCC) 12/14/2014   Hyperlipidemia    Hypertension    Prosthetic valve dysfunction 07/22/2014   Acute mitral stenosis due to restricted leaflet mobility of bileaflet mechanical mitral valve prosthesis   S/P MVR (mitral valve replacement) 04/11/1998   #29 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  No mass effect, no midline shift.  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  Required intubation for airway protection.  Seen by neurosurgery felt not a candidate for surgery with small acute on chronic subdurals recommended against reversal of warfarin given concern about  basilar artery occlusion.  Seen by neurology, EEG ordered, started on Keppra .  Hypotensive, started on norepinephrine  2/1 EEG suggesting moderate to severe diffuse encephalopathy but no seizure or epileptiform discharges.  Repeat CT head ordered, and showing no new changes 2/2 eeg w areas of increased epileptogenicity + slowing. Off cont sedation. ECHO  w MV gradient change and decr EF 2/3  + RSV; EEG in place which is showing high risk of epileptogenicity but no active seizures; neuro added valproic  acid  Interim History / Subjective:  Intubated on PSV trial cEEG in place Minimal response to painful stimuli; weak cough/gag  Objective   Blood pressure (!) 110/57, pulse 82, temperature 98.1 F (36.7 C), resp. rate 13, height 5' 4 (1.626 m), weight 63.4 kg, SpO2 98%. CVP:  [3 mmHg-6 mmHg] 5 mmHg  Vent Mode: PSV;CPAP FiO2 (%):  [30 %-40 %] 30 % Set Rate:  [15 bmp] 15 bmp Vt Set:  [430 mL] 430 mL PEEP:  [5 cmH20] 5 cmH20 Pressure Support:  [8 cmH20] 8 cmH20 Plateau Pressure:  [14 cmH20-16 cmH20] 14 cmH20   Intake/Output Summary (Last 24 hours) at 02/11/2023 0908 Last data filed at 02/11/2023 0500 Gross per 24 hour  Intake 1243.59 ml  Output 1175 ml  Net 68.59 ml   Filed Weights   02/08/23 0500 02/09/23 0420 02/09/23 1653  Weight: 60.4 kg 63.4 kg 63.4 kg   Examination: General:  critically ill appearing on mech vent HEENT: MM pink/moist; ETT in place Neuro: some minimal mvt to painful stimuli; perrl; weak cough/gag CV: s1s2, rate 80s, no m/r/g PULM:  dim clear BS bilaterally; on  mech vent PSV GI: soft, bsx4 active  Extremities: warm/dry, no edema  Skin: no rashes or lesions    Resolved Hospital Problem list    Hypothyroidism Lactic acidosis  Assessment & Plan:   Acute encephalopathy Acute SDH s/p kcentra  and Vit K  Possible seizure PTA Chronic vertebral artery occlusion Vascular dementia  P: -neuro and nsg following; appreciate recs -cEEG -seizure precautions -AEDs  per neuro -limit sedating meds -Continue neuroprotective measures- normothermia, euglycemia, HOB greater than 30, head in neutral alignment, normocapnia, normoxia.  -when to resume heparin  per NSG -resume home aricept   Acute respiratory failure RSV infection Possible aspiration PNA P: -cont rocephin  for possible aspiration -follow cultures -PSV trials during day as tolerated; unable to extubate given ams -rest on PRVC overnight; LTVV strategy with tidal volumes of 6-8 cc/kg ideal body weight -Wean PEEP/FiO2 for SpO2 >92% -VAP bundle in place -Daily SAT and SBT -PAD protocol in place -wean sedation for RASS goal 0 to -1  Hypotension  -coox argues against cardiogenic etiology  -no leukocytosis, temps elevated transiently on 2/1 only  -LA and great UOP both argue against this truly being shock  P: -rocephin  as above  Afib on warfarin at home RBBB S/p bioprosthetic MVR, moderate mitral stenosis  AoC systolic HF  -warfarin at home  -ECHO 2/2 -- LVEF 45-50, LV global hypokinesis, moderate Mitral stenosis and mean mitral valve flow gradient of  P: -hold home anti-htn meds w/ soft bp -resume statin -hold asa -cards consult when we have a better grasp of neuro status -- would need TEE   CKD 3a  P: -Trend BMP / urinary output -Replace electrolytes as indicated -Avoid nephrotoxic agents, ensure adequate renal perfusion  Anemia Thrombocytopenia  P: -trend cbc  Upper extremity discoloration -suspect  r/t prior PIVs -- very fragile vasculature  -central access -defer extremity ultrasound for now especially since big picture she needs anticoagulation (for Afib) regardless    Best Practice (right click and Reselect all SmartList Selections daily)   Diet/type: tubefeeds DVT prophylaxis SCD Pressure ulcer(s): N/A GI prophylaxis: PPI Lines: N/A Foley:  Yes, and it is still needed Code Status:  full code Last date of multidisciplinary goals of care discussion  [2/4 discussed w/ 2 sons at bedside concerns that ams is still not improving and what would patient want if we are unable to get her off the ventilator and mentioned would she want tracheostomy. Brothers state they don't think they would want to put her through that but would want to discuss with sister before they make any decisions.  CRITICAL CARE Performed by: Norleen JONETTA Cedar   Total critical care time: 35 minutes  JD Cedar RIGGERS [facility] 02/11/2023, 10:03 AM  Please see [email or web address] for pager details.  From 7A-7P if no response, please call [phone]. After hours, please call ELink [phone].

## 2023-02-11 NOTE — Plan of Care (Signed)
   Problem: Nutrition: Goal: Adequate nutrition will be maintained Outcome: Progressing

## 2023-02-11 NOTE — Progress Notes (Addendum)
 NEUROLOGY CONSULT FOLLOW UP NOTE   Date of service: February 11, 2023 Patient Name: Breyonna Nault MRN:  [id number] DOB:  05-14-1945  Interval Hx/subjective  Two sons are at bedside today. Pt not responding to voice. Exhibits minimal response to sternal rub.   Vitals   Vitals:   02/11/23 0321 02/11/23 0400 02/11/23 0500 02/11/23 0600  BP:  (!) 107/55 (!) 110/55 (!) 110/57  Pulse: 79 78 79 82  Resp: 15 15 (!) 0 13  Temp:      TempSrc:      SpO2: 98% 98% 98% 98%  Weight:      Height:         Body mass index is 23.99 kg/m.  Physical Exam   Physical Exam  Constitutional: Intubated elderly patient in no acute distress Eyes: No scleral injection.  HENT: ET tube in place Head: Normocephalic.  Cardiovascular: Normal rate and regular rhythm.  Respiratory: Respirations synchronous with ventilator Skin: WDI.   Neurological Examination Mental Status: Not on any sedation. She does not respond to voice and is unable to follow commands. She does not respond to her son's voice. Localizes slowly to sustained sternal rub.  Cranial Nerves: Pupils small but reactive to light, oculocephalic reflex present, good corneal reflexes bilaterally, gag reflex present  Motor/Sensory: Withdraws and grimaces to noxious stimuli applied to upper and lower extremities bilaterally.  Deep Tendon Reflexes: 2+ in biceps and brachioradialis bilaterally. 1+ left patellar and right patellar.  Cerebellar: Unable to perform given pt's encephalopathy Gait: Deferred  Medications  Current Facility-Administered Medications:    cefTRIAXone  (ROCEPHIN ) 2 g in sodium chloride  0.9 % 100 mL IVPB, 2 g, Intravenous, Q24H, Harold Scholz, MD, Stopped at 02/10/23 1547   Chlorhexidine  Gluconate Cloth 2 % PADS 6 each, 6 each, Topical, Daily, Olalere, Adewale A, MD, 6 each at 02/10/23 0941   docusate (COLACE) 50 MG/5ML liquid 100 mg, 100 mg, Per Tube, BID PRN, Olalere, Adewale A, MD   feeding supplement (OSMOLITE 1.2  CAL) liquid 1,000 mL, 1,000 mL, Per Tube, Continuous, Harold Scholz, MD, Last Rate: 55 mL/hr at 02/11/23 0100, Infusion Verify at 02/11/23 0100   fentaNYL  (SUBLIMAZE ) bolus via infusion 25-100 mcg, 25-100 mcg, Intravenous, Q15 min PRN, Patsey Lot, MD   fentaNYL  (SUBLIMAZE ) injection 25 mcg, 25 mcg, Intravenous, Q15 min PRN, Jenna Maude FORBES, NP, 25 mcg at 02/10/23 2159   fentaNYL  (SUBLIMAZE ) injection 25-100 mcg, 25-100 mcg, Intravenous, Q30 min PRN, Jenna Maude FORBES, NP, 50 mcg at 02/10/23 0416   influenza vaccine adjuvanted (FLUAD) injection 0.5 mL, 0.5 mL, Intramuscular, Prior to discharge, Harold Scholz, MD   levETIRAcetam  (KEPPRA ) IVPB 500 mg/100 mL premix, 500 mg, Intravenous, BID, Khaliqdina, Salman, MD, Stopped at 02/10/23 2211   midazolam  (VERSED ) injection 2 mg, 2 mg, Intravenous, Q1H PRN, Babcock, Peter E, NP, 2 mg at 02/09/23 0023   norepinephrine  (LEVOPHED ) 4mg  in (0.016 mg/mL) premix infusion, 0-40 mcg/min, Intravenous, Titrated, Bowser, Ronnald FORBES, NP, Stopped at 02/10/23 1551   Oral care mouth rinse, 15 mL, Mouth Rinse, Q2H, Olalere, Adewale A, MD, 15 mL at 02/11/23 0600   Oral care mouth rinse, 15 mL, Mouth Rinse, PRN, Olalere, Adewale A, MD   pantoprazole  (PROTONIX ) injection 40 mg, 40 mg, Intravenous, QHS, Olalere, Adewale A, MD, 40 mg at 02/10/23 2159   pneumococcal 20-valent conjugate vaccine (PREVNAR 20) injection 0.5 mL, 0.5 mL, Intramuscular, Prior to discharge, Harold Scholz, MD   polyethylene glycol (MIRALAX  / GLYCOLAX ) packet 17 g, 17 g, Per  Tube, Daily PRN, Olalere, Adewale A, MD   valproate (DEPACON ) 325 mg in dextrose  5 % 50 mL IVPB, 325 mg, Intravenous, Q8H, Melana Hingle, MD, Last Rate: 53.3 mL/hr at 02/11/23 0613, 325 mg at 02/11/23 0613  Labs and Diagnostic Imaging   CBC:  Recent Labs  Lab 02/07/23 1844 02/07/23 1853 02/10/23 0308 02/11/23 0459  WBC 6.8   < > 9.0 6.2  NEUTROABS 5.6  --   --   --   HGB 9.2*   < > 8.1* 7.3*  HCT 32.0*   < > 26.7*  24.5*  MCV 81.2   < > 79.0* 78.3*  PLT 120*   < > 137* 122*   < > = values in this interval not displayed.    Basic Metabolic Panel:  Lab Results  Component Value Date   NA 142 02/11/2023   K 3.7 02/11/2023   CO2 26 02/11/2023   GLUCOSE 92 02/11/2023   BUN 35 (H) 02/11/2023   CREATININE 1.74 (H) 02/11/2023   CALCIUM  8.3 (L) 02/11/2023   GFRNONAA 30 (L) 02/11/2023   GFRAA 42 (L) 03/14/2015   Lipid Panel:  Lab Results  Component Value Date   LDLCALC 85 08/24/2021   HgbA1c:  Lab Results  Component Value Date   HGBA1C 5.5 05/17/2016   Urine Drug Screen:     Component Value Date/Time   LABOPIA NONE DETECTED 02/07/2023 2058   COCAINSCRNUR NONE DETECTED 02/07/2023 2058   LABBENZ NONE DETECTED 02/07/2023 2058   AMPHETMU NONE DETECTED 02/07/2023 2058   THCU NONE DETECTED 02/07/2023 2058   LABBARB NONE DETECTED 02/07/2023 2058    Alcohol  Level     Component Value Date/Time   ETH <10 02/07/2023 1844   INR  Lab Results  Component Value Date   INR 1.5 (H) 02/09/2023   APTT  Lab Results  Component Value Date   APTT 89 (H) 02/07/2023   EEG 02/04: EEG showed evidence of epileptogenicity arising from right more than left parieto-occipital region. Additionally there is cortical dysfunction in right hemisphere likely secondary to underlying subdural hematoma.  Lastly there is moderate to severe diffuse encephalopathy. No seizures were noted.   Assessment  Sreeja E Laurene Melendrez is a 78 y.o. female with a history of vascular dementia, hypertension, bioprosthetic mitral valve replacement, A-fib on warfarin and endocarditis who was found unresponsive 01/31.  On arrival to the ED, she was noted to have pinpoint pupils, left hemiparesis and right gaze deviation.  She was intubated for airway protection.  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  MRI brain revealed no stroke.  Patient  was given Ativan  and loaded with Keppra .  Neurosurgery felt that the small size of the subdural hematoma did not warrant surgical intervention.   - Exam today is essentially unchanged relative to yesterday.  - LTM EEG findings: There is evidence of epileptogenicity arising from the right more than left parieto-occipital region, but no electrographic seizures were noted. Additionally, there is cortical dysfunction in the right hemisphere, likely secondary to underlying subdural hematoma.  Lastly, there is moderate to severe diffuse encephalopathy.  - DDx:  - EEG shows no active seizures, but there is evidence of epileptogenicity arising from the right hemisphere, increasing the suspicion for seizure with postictal state as the etiology for her initial presentation. She was started on anticonvulsant medication this admission and is now on 2 AEDs.  - Her continuing encephalopathy  appears to be multifactorial in nature.  - Postictal state - Secondary to concussion from a possible fall that resulted in the SDH.  - A severe hypoactive hospital delirium is another possible complicating factor with her hx of vascular dementia.  - RSV infection could be contributing but overall symptom burden appears low. Pt is afebrile and without any leukocytosis.  - Given her frailty and her presentation her overall prognosis is guarded.   Recommendations  - Continue Keppra  500 mg twice daily, She is already on maximum-dose Keppra  for her renal function.  - Continue Depakote 325 mg q8hrs  - Continue seizure precautions -- Will discontinue LTM EEG given no seizures on EEG - Minimize sedating meds - Management of subdural hematoma per neurosurgery, will repeat CTH today to ensure no expansion of subdural hematoma.  - Given A-fib and mitral valve replacement, CCM team plans to restart anticoagulation with heparin , but their notes states that they will need approval from Neurosurgery. Will need to be closely monitored while  on anticoagulation due to risk of enlargement of her subdural hematoma. As the subdural was most likely traumatic rather than spontaneous, risk/benefit ratio favors anticoagulation.  - Given overall guarded prognosis, will continue discussing GOC with family.   Addendum: Repeat CT head: Slightly decreased size of the subdural hematoma overlying the right cerebral convexity, which now measures up to 5 mm and is slightly decreased in density. No significant mass effect or midline shift. No new intracranial hemorrhage. ______________________________________________________________________   Assisting with this follow up evaluation: Morene Bathe, MD Jolynn DEL. [facility], PGY-3    Electronically signed: Dr. Alphonza Tramell

## 2023-02-11 NOTE — Progress Notes (Signed)
Transported pt from 2H12 to CT3 and back with bedside RN. Vitals are stable.

## 2023-02-11 NOTE — Progress Notes (Signed)
 LTM EEG discontinued - no skin breakdown at [facility].

## 2023-02-11 NOTE — Plan of Care (Signed)

## 2023-02-11 NOTE — Progress Notes (Signed)
[facility] ADULT ICU REPLACEMENT PROTOCOL   The patient does apply for the [facility] Adult ICU Electrolyte Replacment Protocol based on the criteria listed below:   1.Exclusion criteria: TCTS, ECMO, Dialysis, and Myasthenia Gravis patients 2. Is GFR >/= 30 ml/min? Yes.    Patient's GFR today is 30 3. Is SCr </= 2? Yes.   Patient's SCr is 1.74 mg/dL 4. Did SCr increase >/= 0.5 in 24 hours? No. 5.Pt's weight >40kg  Yes.   6. Abnormal electrolyte(s): Potassium  7. Electrolytes replaced per protocol 8.  Call MD STAT for K+ </= 2.5, Phos </= 1, or Mag </= 1 Physician:  Dr. Faye Ramsay A Jaysten Essner 02/11/2023 6:20 AM

## 2023-02-12 DIAGNOSIS — I4891 Unspecified atrial fibrillation: Secondary | ICD-10-CM | POA: Diagnosis not present

## 2023-02-12 DIAGNOSIS — S065XAA Traumatic subdural hemorrhage with loss of consciousness status unknown, initial encounter: Secondary | ICD-10-CM | POA: Diagnosis not present

## 2023-02-12 DIAGNOSIS — G9340 Encephalopathy, unspecified: Secondary | ICD-10-CM | POA: Diagnosis not present

## 2023-02-12 DIAGNOSIS — J9600 Acute respiratory failure, unspecified whether with hypoxia or hypercapnia: Secondary | ICD-10-CM | POA: Diagnosis not present

## 2023-02-12 LAB — BASIC METABOLIC PANEL
Anion gap: 6 (ref 5–15)
BUN: 30 mg/dL — ABNORMAL HIGH (ref 8–23)
CO2: 28 mmol/L (ref 22–32)
Calcium: 8.6 mg/dL — ABNORMAL LOW (ref 8.9–10.3)
Chloride: 106 mmol/L (ref 98–111)
Creatinine, Ser: 1.4 mg/dL — ABNORMAL HIGH (ref 0.44–1.00)
GFR, Estimated: 39 mL/min — ABNORMAL LOW (ref >60)
Glucose, Bld: 118 mg/dL — ABNORMAL HIGH (ref 70–99)
Potassium: 4.3 mmol/L (ref 3.5–5.1)
Sodium: 140 mmol/L (ref 135–145)

## 2023-02-12 LAB — GLUCOSE, CAPILLARY
Glucose-Capillary: 104 mg/dL — ABNORMAL HIGH (ref 70–99)
Glucose-Capillary: 107 mg/dL — ABNORMAL HIGH (ref 70–99)
Glucose-Capillary: 112 mg/dL — ABNORMAL HIGH (ref 70–99)
Glucose-Capillary: 116 mg/dL — ABNORMAL HIGH (ref 70–99)
Glucose-Capillary: 120 mg/dL — ABNORMAL HIGH (ref 70–99)
Glucose-Capillary: 121 mg/dL — ABNORMAL HIGH (ref 70–99)
Glucose-Capillary: 97 mg/dL (ref 70–99)

## 2023-02-12 LAB — CBC
HCT: 24 % — ABNORMAL LOW (ref 36.0–46.0)
Hemoglobin: 7.3 g/dL — ABNORMAL LOW (ref 12.0–15.0)
MCH: 23.9 pg — ABNORMAL LOW (ref 26.0–34.0)
MCHC: 30.4 g/dL (ref 30.0–36.0)
MCV: 78.4 fL — ABNORMAL LOW (ref 80.0–100.0)
Platelets: 116 10*3/uL — ABNORMAL LOW (ref 150–400)
RBC: 3.06 MIL/uL — ABNORMAL LOW (ref 3.87–5.11)
RDW: 22.4 % — ABNORMAL HIGH (ref 11.5–15.5)
WBC: 5 10*3/uL (ref 4.0–10.5)
nRBC: 0.8 % — ABNORMAL HIGH (ref 0.0–0.2)

## 2023-02-12 LAB — CULTURE, BLOOD (ROUTINE X 2)
Culture: NO GROWTH
Culture: NO GROWTH
Special Requests: ADEQUATE
Special Requests: ADEQUATE

## 2023-02-12 LAB — PROTIME-INR
INR: 3.6 — ABNORMAL HIGH (ref 0.8–1.2)
Prothrombin Time: 36.4 s — ABNORMAL HIGH (ref 11.4–15.2)

## 2023-02-12 NOTE — Plan of Care (Signed)

## 2023-02-12 NOTE — Progress Notes (Addendum)
 NEUROLOGY CONSULT FOLLOW UP NOTE   Date of service: February 12, 2023 Patient Name: [name] MRN:  [id number] DOB:  07/19/1945  Interval Hx/subjective   Patient with eyes upon entering room. Not responding to voice. Has minimal response to sternal rub.   Vitals   Vitals:   02/12/23 0500 02/12/23 0600 02/12/23 0832 02/12/23 0835  BP: (!) 118/58 (!) 109/54    Pulse: 81 79 83   Resp: 15 15 15    Temp:      TempSrc:      SpO2: 100% 100% 100% 100%  Weight:      Height:         Body mass index is 23.99 kg/m.  Physical Exam   Physical Exam  Constitutional: Intubated elderly patient in no acute distress Eyes: No scleral injection.  HENT: ET tube in place Head: Normocephalic.  Cardiovascular: Normal rate and regular rhythm.  Respiratory: Respirations synchronous with ventilator Skin: WDI.   Neurological Examination Mental Status: Not on any sedation. She does not respond to voice and is unable to follow commands consistently. At times, she will turn head towards the speaker but this is not consistent. Localizes slowly to sustained sternal rub.  Cranial Nerves: Pupils small but reactive to light, oculocephalic reflex present, good corneal reflexes bilaterally, gag reflex present  Motor/Sensory: Withdraws and grimaces to noxious stimuli applied to upper and lower extremities bilaterally. Her withdrawal is more pronounced compared to yesterday.  Deep Tendon Reflexes: 2+ in biceps and brachioradialis bilaterally. 1+ left patellar and right patellar.  Cerebellar: Unable to perform given pt's encephalopathy Gait: Deferred  Medications  Current Facility-Administered Medications:    atorvastatin  (LIPITOR) tablet 40 mg, 40 mg, Per Tube, Daily, Payne, John D, PA-C, 40 mg at 02/11/23 1037   cefTRIAXone  (ROCEPHIN ) 2 g in sodium chloride  0.9 % 100 mL IVPB, 2 g, Intravenous, Q24H, Harold Scholz, MD, Stopped at 02/11/23 1549   Chlorhexidine  Gluconate Cloth 2 % PADS 6 each, 6  each, Topical, Daily, Olalere, Adewale A, MD, 6 each at 02/11/23 1000   docusate (COLACE) 50 MG/5ML liquid 100 mg, 100 mg, Per Tube, BID PRN, Olalere, Adewale A, MD   donepezil  (ARICEPT ) tablet 10 mg, 10 mg, Per Tube, QHS, Payne, John D, PA-C, 10 mg at 02/11/23 2201   feeding supplement (OSMOLITE 1.2 CAL) liquid 1,000 mL, 1,000 mL, Per Tube, Continuous, Chand, Sudham, MD, Last Rate: 55 mL/hr at 02/12/23 0600, Infusion Verify at 02/12/23 0600   fentaNYL  (SUBLIMAZE ) bolus via infusion 25-100 mcg, 25-100 mcg, Intravenous, Q15 min PRN, Patsey Lot, MD   fentaNYL  (SUBLIMAZE ) injection 25 mcg, 25 mcg, Intravenous, Q15 min PRN, Jenna Maude FORBES, NP, 25 mcg at 02/10/23 2159   fentaNYL  (SUBLIMAZE ) injection 25-100 mcg, 25-100 mcg, Intravenous, Q30 min PRN, Jenna Maude FORBES, NP, 25 mcg at 02/11/23 2249   levETIRAcetam  (KEPPRA ) IVPB 500 mg/100 mL premix, 500 mg, Intravenous, BID, Khaliqdina, Salman, MD, Stopped at 02/11/23 2220   midazolam  (VERSED ) injection 2 mg, 2 mg, Intravenous, Q1H PRN, Babcock, Peter E, NP, 2 mg at 02/09/23 0023   modafinil  (PROVIGIL ) tablet 200 mg, 200 mg, Per Tube, Daily, Payne, John D, PA-C, 200 mg at 02/11/23 1259   norepinephrine  (LEVOPHED ) 4mg  in (0.016 mg/mL) premix infusion, 0-40 mcg/min, Intravenous, Titrated, Bowser, Ronnald FORBES, NP, Stopped at 02/10/23 1551   Oral care mouth rinse, 15 mL, Mouth Rinse, Q2H, Olalere, Adewale A, MD, 15 mL at 02/12/23 0521   Oral care mouth rinse, 15 mL, Mouth Rinse,  PRN, Neda Hammond A, MD   pantoprazole  (PROTONIX ) injection 40 mg, 40 mg, Intravenous, QHS, Olalere, Adewale A, MD, 40 mg at 02/11/23 2201   polyethylene glycol (MIRALAX  / GLYCOLAX ) packet 17 g, 17 g, Per Tube, Daily PRN, Olalere, Adewale A, MD   valproate (DEPACON ) 325 mg in dextrose  5 % 50 mL IVPB, 325 mg, Intravenous, Q8H, Ayde Record, MD, Last Rate: 53.3 mL/hr at 02/12/23 0600, Infusion Verify at 02/12/23 0600  Labs and Diagnostic Imaging   CBC:  Recent Labs  Lab  02/07/23 1844 02/07/23 1853 02/11/23 0459 02/12/23 0431  WBC 6.8   < > 6.2 5.0  NEUTROABS 5.6  --   --   --   HGB 9.2*   < > 7.3* 7.3*  HCT 32.0*   < > 24.5* 24.0*  MCV 81.2   < > 78.3* 78.4*  PLT 120*   < > 122* 116*   < > = values in this interval not displayed.    Basic Metabolic Panel:  Lab Results  Component Value Date   NA 140 02/12/2023   K 4.3 02/12/2023   CO2 28 02/12/2023   GLUCOSE 118 (H) 02/12/2023   BUN 30 (H) 02/12/2023   CREATININE 1.40 (H) 02/12/2023   CALCIUM  8.6 (L) 02/12/2023   GFRNONAA 39 (L) 02/12/2023   GFRAA 42 (L) 03/14/2015   Lipid Panel:  Lab Results  Component Value Date   LDLCALC 85 08/24/2021   HgbA1c:  Lab Results  Component Value Date   HGBA1C 5.5 05/17/2016   Urine Drug Screen:     Component Value Date/Time   LABOPIA NONE DETECTED 02/07/2023 2058   COCAINSCRNUR NONE DETECTED 02/07/2023 2058   LABBENZ NONE DETECTED 02/07/2023 2058   AMPHETMU NONE DETECTED 02/07/2023 2058   THCU NONE DETECTED 02/07/2023 2058   LABBARB NONE DETECTED 02/07/2023 2058    Alcohol  Level     Component Value Date/Time   ETH <10 02/07/2023 1844   INR  Lab Results  Component Value Date   INR 1.5 (H) 02/09/2023   APTT  Lab Results  Component Value Date   APTT 89 (H) 02/07/2023   EEG 02/04: EEG showed evidence of epileptogenicity arising from right more than left parieto-occipital region. Additionally there is cortical dysfunction in right hemisphere likely secondary to underlying subdural hematoma.  Lastly there is moderate to severe diffuse encephalopathy. No seizures were noted.   Assessment  [name] is a 78 y.o. female with a history of vascular dementia, hypertension, bioprosthetic mitral valve replacement, A-fib on warfarin and endocarditis who was found unresponsive 01/31.  On arrival to the ED, she was noted to have pinpoint pupils, left hemiparesis and right gaze deviation.  She was intubated for airway protection.  CT head  revealed mixed density right subdural hematoma, and chronic basilar and left vertebral artery occlusions were seen on CT angiogram.  MRI brain revealed no stroke.  Patient was given Ativan  and loaded with Keppra .  Neurosurgery felt that the small size of the subdural hematoma did not warrant surgical intervention.   - Exam today with minimal improvement compared to yesterday but overall clinical picture and disability is unchanged relative to yesterday .  - DDx:  - Her continuing encephalopathy appears to be multifactorial in nature.  - Postictal state: Pt is on 2 AEDs, LTM EEG did not show any seizures and was discontinued 02/11/23. Her presentation and EEG showing evidence of epileptogenicity suggest this as leading cause. - Secondary to concussion from a  possible fall that resulted in the SDH.  - A severe hypoactive hospital delirium is another possible complicating factor with her hx of vascular dementia.  - RSV infection could be contributing but overall symptom burden appears low. Pt is afebrile and without any leukocytosis.  - Given her frailty and her overall clinical presentation with little improvement over serial neurological exams, her prognosis is guarded.   Recommendations  - Continue supportive care and GOC discussions with family given overall guarded prognosis. Discussed involving palliative care team to assist with GOC and daughter in agreement. Will discuss with PCCM and consult palliative care team.  - Continue Keppra  500 mg twice daily, She is already on maximum-dose Keppra  for her renal function.  - Continue Depakote 325 mg q8hrs  - Continue seizure precautions - Minimize sedating meds - Management of subdural hematoma per neurosurgery, fortunately her hematoma is showing decrease in size - Given A-fib and mitral valve replacement, CCM team plans to restart anticoagulation with heparin , but their notes states that they will need approval from Neurosurgery. Will need to be  closely monitored while on anticoagulation due to risk of enlargement of her subdural hematoma. As the subdural was most likely traumatic rather than spontaneous, risk/benefit ratio favors anticoagulation.  - Neurology will follow PRN. Please call with questions.   _____________________________________________________________  Assisting with this follow up evaluation: Morene Bathe, MD Jolynn DEL. [facility] Internal Medicine Residency, PGY-3   Electronically signed: Dr. Brylyn Novakovich

## 2023-02-12 NOTE — Progress Notes (Signed)
 NAME:  Verla Bryngelson, MRN:  [id number], DOB:  [birth year], LOS: 5 ADMISSION DATE:  02/07/2023, CONSULTATION DATE:  02/07/2023 REFERRING MD: Dr Patsey, ED, CHIEF COMPLAINT:  AMS, code stroke   History of Present Illness:  Patient was brought into the hospital following being found unresponsive by her daughter, last known well 730 this morning She was found when daughter came back from work, difficulty breathing, EMS activated.  Was bradycardic, hypotensive on initial evaluation, pinpoint pupils, facial droop.  No response to Narcan Intubated in the ED for airway protection  Daughter is unclear whether she may have had a seizure  Had some chest congestion over the last week Was her usual self this morning when she was last seen   Pertinent  Medical History   Past Medical History:  Diagnosis Date   Acute CHF (congestive heart failure) (HCC) 12/14/2014   Hyperlipidemia    Hypertension    Prosthetic valve dysfunction 07/22/2014   Acute mitral stenosis due to restricted leaflet mobility of bileaflet mechanical mitral valve prosthesis   S/P MVR (mitral valve replacement) 04/11/1998   #29 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  No mass effect, no midline shift.  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  Required intubation for airway protection.  Seen by neurosurgery felt not a candidate for surgery with small acute on chronic subdurals recommended against reversal of warfarin given concern about  basilar artery occlusion.  Seen by neurology, EEG ordered, started on Keppra .  Hypotensive, started on norepinephrine  2/1 EEG suggesting moderate to severe diffuse encephalopathy but no seizure or epileptiform discharges.  Repeat CT head ordered, and showing no new changes 2/2 eeg w areas of increased epileptogenicity + slowing. Off cont sedation. ECHO  w MV gradient change and decr EF 2/3  + RSV; EEG in place which is showing high risk of epileptogenicity but no active seizures; neuro added valproic  acid  Interim History / Subjective:  Intubated No significant change in neuro status  Objective   Blood pressure 128/67, pulse 84, temperature 98.7 F (37.1 C), temperature source Axillary, resp. rate 15, height 5' 4 (1.626 m), weight 63.4 kg, SpO2 100%. CVP:  [0 mmHg-15 mmHg] 6 mmHg  Vent Mode: PSV;CPAP FiO2 (%):  [30 %] 30 % Set Rate:  [15 bmp] 15 bmp Vt Set:  [430 mL] 430 mL PEEP:  [5 cmH20] 5 cmH20 Pressure Support:  [10 cmH20] 10 cmH20 Plateau Pressure:  [18 cmH20] 18 cmH20   Intake/Output Summary (Last 24 hours) at 02/12/2023 1004 Last data filed at 02/12/2023 1000 Gross per 24 hour  Intake 1879.99 ml  Output 765 ml  Net 1114.99 ml   Filed Weights   02/08/23 0500 02/09/23 0420 02/09/23 1653  Weight: 60.4 kg 63.4 kg 63.4 kg   Examination: General:  critically ill appearing on mech vent HEENT: MM pink/moist; ETT in place Neuro: eyes open on arrival; some minimal mvt of UE spontaneously; perrl; weak cough/gag CV: s1s2, rate 80s, no m/r/g PULM:  dim clear BS bilaterally; on mech vent PSV  GI: soft, bsx4 active  Extremities: warm/dry, no edema  Skin: no rashes or lesions    Resolved Hospital Problem list    Hypothyroidism Lactic acidosis Hypotension   Assessment & Plan:   Acute encephalopathy Acute SDH s/p kcentra  and Vit K  Possible seizure PTA Chronic vertebral artery occlusion Vascular dementia  P: -neuro and nsg following; appreciate recs -cEEG -seizure  precautions -AEDs per neuro -limit sedating meds -Continue neuroprotective measures- normothermia, euglycemia, HOB greater than 30, head in neutral alignment, normocapnia, normoxia.  -when to resume heparin  per NSG -provigil  added yesterday -cont home aricept   Acute respiratory failure RSV infection Possible aspiration PNA P: -completed 5 days rocephin  for possible aspiration -follow cultures -PSV trials during day as tolerated; unable to extubate given ams and inability to protect airway -rest on PRVC overnight; LTVV strategy with tidal volumes of 6-8 cc/kg ideal body weight -Wean PEEP/FiO2 for SpO2 >92% -VAP bundle in place -Daily SAT and SBT -PAD protocol in place -wean sedation for RASS goal 0 to -1  Afib on warfarin at home RBBB S/p bioprosthetic MVR, moderate mitral stenosis  AoC systolic HF  -warfarin at home  -ECHO 2/2 -- LVEF 45-50, LV global hypokinesis, moderate Mitral stenosis and mean mitral valve flow gradient of  P: -hold home anti-htn meds w/ soft bp -statin -hold asa -when to resume heparin  per nsg -cards consult when we have a better grasp of neuro status -- would need TEE   CKD 3a  P: -Trend BMP / urinary output -Replace electrolytes as indicated -Avoid nephrotoxic agents, ensure adequate renal perfusion  Anemia Thrombocytopenia  P: -trend cbc  Upper extremity discoloration -suspect  r/t prior PIVs -- very fragile vasculature  -central access -hopefully resume heparin  soon per nsg   Best Practice (right click and Reselect all SmartList Selections daily)   Diet/type: tubefeeds DVT prophylaxis SCD; consider starting heparin  today Pressure ulcer(s): N/A GI prophylaxis: PPI Lines: Central line Foley:  Yes, and it is still needed Code Status:  full code Last date of multidisciplinary goals of care discussion [2/4 discussed w/ 2 sons at bedside concerns that ams is still not improving and what would patient want if we are unable to  get her off the ventilator and mentioned would she want tracheostomy. Brothers state they don't think they would want to put her through that but would want to discuss with sister before they make any decisions.  CRITICAL CARE Performed by: Norleen JONETTA Cedar   Total critical care time: 35 minutes  JD Cedar RIGGERS [facility] 02/12/2023, 10:04 AM  Please see [email or web address] for pager details.  From 7A-7P if no response, please call [phone]. After hours, please call ELink [phone].

## 2023-02-13 DIAGNOSIS — G9340 Encephalopathy, unspecified: Secondary | ICD-10-CM | POA: Diagnosis not present

## 2023-02-13 DIAGNOSIS — I619 Nontraumatic intracerebral hemorrhage, unspecified: Secondary | ICD-10-CM | POA: Diagnosis not present

## 2023-02-13 DIAGNOSIS — J9601 Acute respiratory failure with hypoxia: Secondary | ICD-10-CM | POA: Diagnosis not present

## 2023-02-13 DIAGNOSIS — I4891 Unspecified atrial fibrillation: Secondary | ICD-10-CM | POA: Diagnosis not present

## 2023-02-13 DIAGNOSIS — B338 Other specified viral diseases: Secondary | ICD-10-CM

## 2023-02-13 LAB — GLUCOSE, CAPILLARY
Glucose-Capillary: 101 mg/dL — ABNORMAL HIGH (ref 70–99)
Glucose-Capillary: 106 mg/dL — ABNORMAL HIGH (ref 70–99)
Glucose-Capillary: 74 mg/dL (ref 70–99)
Glucose-Capillary: 82 mg/dL (ref 70–99)
Glucose-Capillary: 84 mg/dL (ref 70–99)
Glucose-Capillary: 93 mg/dL (ref 70–99)

## 2023-02-13 LAB — PROTIME-INR
INR: 3.7 — ABNORMAL HIGH (ref 0.8–1.2)
Prothrombin Time: 37.3 s — ABNORMAL HIGH (ref 11.4–15.2)

## 2023-02-13 LAB — CBC
HCT: 26.3 % — ABNORMAL LOW (ref 36.0–46.0)
Hemoglobin: 7.9 g/dL — ABNORMAL LOW (ref 12.0–15.0)
MCH: 23.7 pg — ABNORMAL LOW (ref 26.0–34.0)
MCHC: 30 g/dL (ref 30.0–36.0)
MCV: 78.7 fL — ABNORMAL LOW (ref 80.0–100.0)
Platelets: 125 10*3/uL — ABNORMAL LOW (ref 150–400)
RBC: 3.34 MIL/uL — ABNORMAL LOW (ref 3.87–5.11)
RDW: 21.9 % — ABNORMAL HIGH (ref 11.5–15.5)
WBC: 6 10*3/uL (ref 4.0–10.5)
nRBC: 0.7 % — ABNORMAL HIGH (ref 0.0–0.2)

## 2023-02-13 LAB — BASIC METABOLIC PANEL
Anion gap: 6 (ref 5–15)
BUN: 27 mg/dL — ABNORMAL HIGH (ref 8–23)
CO2: 29 mmol/L (ref 22–32)
Calcium: 8.3 mg/dL — ABNORMAL LOW (ref 8.9–10.3)
Chloride: 103 mmol/L (ref 98–111)
Creatinine, Ser: 1.29 mg/dL — ABNORMAL HIGH (ref 0.44–1.00)
GFR, Estimated: 43 mL/min — ABNORMAL LOW (ref >60)
Glucose, Bld: 82 mg/dL (ref 70–99)
Potassium: 4.7 mmol/L (ref 3.5–5.1)
Sodium: 138 mmol/L (ref 135–145)

## 2023-02-13 LAB — CULTURE, BLOOD (SINGLE)
Culture: NO GROWTH
Special Requests: ADEQUATE

## 2023-02-13 MED ORDER — GERHARDT'S BUTT CREAM
TOPICAL_CREAM | CUTANEOUS | Status: DC | PRN
  Filled 2023-02-13: qty 60

## 2023-02-13 MED ORDER — MIDODRINE HCL 5 MG PO TABS
10.0000 mg | ORAL_TABLET | Freq: Three times a day (TID) | ORAL | Status: DC
  Administered 2023-02-13 – 2023-02-17 (×11): 10 mg
  Filled 2023-02-13 (×11): qty 2

## 2023-02-13 MED ORDER — POLYETHYLENE GLYCOL 3350 17 G PO PACK: 17.0000 g | PACK | Freq: Every day | ORAL | Status: DC

## 2023-02-13 MED ORDER — DOCUSATE SODIUM 50 MG/5ML PO LIQD
100.0000 mg | Freq: Two times a day (BID) | ORAL | Status: DC
  Administered 2023-02-14: 100 mg
  Filled 2023-02-13: qty 10

## 2023-02-13 MED ORDER — FENTANYL CITRATE PF 50 MCG/ML IJ SOSY
50.0000 ug | PREFILLED_SYRINGE | INTRAMUSCULAR | Status: DC | PRN
  Administered 2023-02-15 – 2023-02-16 (×2): 50 ug via INTRAVENOUS
  Filled 2023-02-13 (×2): qty 1

## 2023-02-13 MED ORDER — FENTANYL CITRATE PF 50 MCG/ML IJ SOSY
25.0000 ug | PREFILLED_SYRINGE | INTRAMUSCULAR | Status: DC | PRN
  Administered 2023-02-15 – 2023-02-16 (×2): 50 ug via INTRAVENOUS
  Filled 2023-02-13: qty 1
  Filled 2023-02-13: qty 2

## 2023-02-13 MED ORDER — WHITE PETROLATUM EX OINT
TOPICAL_OINTMENT | Freq: Two times a day (BID) | CUTANEOUS | Status: AC
  Administered 2023-02-13 – 2023-02-16 (×7): 0.2 via TOPICAL
  Administered 2023-02-17: 1 via TOPICAL
  Filled 2023-02-13: qty 28.35

## 2023-02-13 NOTE — Progress Notes (Signed)
     Referral received for [name]: goals of care discussion. Chart reviewed. Patient assessed and is unable to engage appropriately in discussions.   I was able to speak with the patient's daughter [name]. PMT meeting scheduled for 02/14/23 between 1400-1500. Family is aware we will meet at patient's bedside.   Detailed note and recommendations to follow once GOC has been completed.   Thank you for your referral and allowing PMT to assist in [name] care.   Stephane Palin, NP Palliative Medicine Team  Team Phone # [phone]   NO CHARGE

## 2023-02-13 NOTE — Progress Notes (Signed)
 NAME:  [name], MRN:  [id number], DOB:  10/24/1945, LOS: 6 ADMISSION DATE:  02/07/2023, CONSULTATION DATE:  02/07/2023 REFERRING MD: Dr Patsey, ED, CHIEF COMPLAINT:  AMS, code stroke   History of Present Illness:  Patient was brought into the hospital following being found unresponsive by her daughter, last known well 730 this morning She was found when daughter came back from work, difficulty breathing, EMS activated.  Was bradycardic, hypotensive on initial evaluation, pinpoint pupils, facial droop.  No response to Narcan Intubated in the ED for airway protection  Daughter is unclear whether she may have had a seizure  Had some chest congestion over the last week Was her usual self this morning when she was last seen   Pertinent  Medical History   Past Medical History:  Diagnosis Date   Acute CHF (congestive heart failure) (HCC) 12/14/2014   Hyperlipidemia    Hypertension    Prosthetic valve dysfunction 07/22/2014   Acute mitral stenosis due to restricted leaflet mobility of bileaflet mechanical mitral valve prosthesis   S/P MVR (mitral valve replacement) 04/11/1998   #29 St. 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  No mass effect, no midline shift.  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  Required intubation for airway protection.  Seen by neurosurgery felt not a candidate for surgery with small acute on chronic subdurals recommended against reversal of warfarin given concern about  basilar artery occlusion.  Seen by neurology, EEG ordered, started on Keppra .  Hypotensive, started on norepinephrine  2/1 EEG suggesting moderate to severe diffuse encephalopathy but no seizure or epileptiform discharges.  Repeat CT head ordered, and showing no new changes 2/2 eeg w areas of increased epileptogenicity + slowing. Off cont sedation. ECHO  w MV gradient change and decr EF 2/3  + RSV; EEG in place which is showing high risk of epileptogenicity but no active seizures; neuro added valproic  acid 2/5 plan to cont through the weekend and re-eval GOC . INR 3.6 not on AC 2/6 recheck INR   Interim History / Subjective:  NAEO   Objective   Blood pressure 130/64, pulse 93, temperature 99.3 F (37.4 C), temperature source Axillary, resp. rate 15, height 5' 4 (1.626 m), weight 62.8 kg, SpO2 100%. CVP:  [5 mmHg-15 mmHg] 10 mmHg  Vent Mode: PRVC FiO2 (%):  [30 %] 30 % Set Rate:  [15 bmp] 15 bmp Vt Set:  [430 mL] 430 mL PEEP:  [5 cmH20] 5 cmH20 Pressure Support:  [10 cmH20] 10 cmH20 Plateau Pressure:  [15 cmH20-20 cmH20] 16 cmH20   Intake/Output Summary (Last 24 hours) at 02/13/2023 1131 Last data filed at 02/13/2023 0900 Gross per 24 hour  Intake 1141.79 ml  Output 1025 ml  Net 116.79 ml   Filed Weights   02/09/23 0420 02/09/23 1653 02/13/23 0457  Weight: 63.4 kg 63.4 kg 62.8 kg   Examination: General:  critically ill elderly F intubated HEENT: ETT secure anicteric sclera  Neuro: opens eyes to stimulation. Does not follow commands.  CV: rr s1s2 no rgm  PULM:  Symmetrical chest expansion. Mechanically ventilated. Some crackles  GI: soft ndnt + bowel sounds  Extremities: no acute joint deformity  Skin: c/d/w    Resolved Hospital Problem list    Hypothyroidism Lactic acidosis Hypotension   Assessment & Plan:   Acute encephalopathy Acute SDH s/p K Centra and Vit K  Likely seizure PTA Chronic vertebral artery occlusion Vascular dementia  P: -neuro and nsgy  following -keppra , VPA  -remains off cont sedation  -provigil   Acute resp failure RSV infection Possible aspiration  s/p 5d rocephin   P: -droplet -cont MV support. Mechanics do ok on PSV. Mentation is the barrier -VAP, pulm hygiene   Afib on warfarin  Coagulopathy // warfarin toxicity  S/p bioprosthetic MVR with current moderate mitral stenosis AoC systolic HF  -warfarin at home  -ECHO 2/2 -- LVEF 45-50, LV global hypokinesis, moderate Mitral stenosis and mean mitral valve flow gradient of  P: -rechecking an INR 2/6 -- it had come up to 3.6 on 2/5. Just want to make sure we dont need to repeat Vit K  -we are not yet resuming AC. When we do, will do a hep gtt no bolus  -Statin -holding ASA -when neuro status is better gleaned, cards consult for possible TEE   CKD 3a  P: -trend renal indices  -dc foley   Anemia Thrombocytopenia  P: -trend cbc  Upper extremity discoloration -suspect  r/t prior PIVs -- very fragile vasculature  -cont central access   Best Practice (right click and Reselect all SmartList Selections daily)   Diet/type: tubefeeds DVT prophylaxis SCD  Pressure ulcer(s): N/A GI prophylaxis: PPI Lines: Central line Foley:  Yes, and it is still needed Code Status:  full code Last date of multidisciplinary goals of care discussion [2/4 discussed w/ 2 sons at bedside concerns that ams is still not improving and what would patient want if we are unable to get her off the ventilator and mentioned would she want tracheostomy. Brothers state they don't think they would want to put her through that but would want to discuss with sister before they make any decisions.  CRITICAL CARE Performed by: Ronnald FORBES Gave   Total critical care time: 38 minutes  Critical care time was exclusive of separately billable procedures and treating other patients. Critical care was necessary to treat or prevent imminent or life-threatening deterioration.  Critical care  was time spent personally by me on the following activities: development of treatment plan with patient and/or surrogate as well as nursing, discussions with consultants, evaluation of patient's response to treatment, examination of patient, obtaining history from patient or surrogate, ordering and performing treatments and interventions, ordering and review of laboratory studies, ordering and review of radiographic studies, pulse oximetry and re-evaluation of patient's condition.'  Ronnald Gave MSN, AGACNP-BC [place] Pulmonary/Critical Care Medicine Amion for pager 02/13/2023, 11:31 AM

## 2023-02-13 NOTE — Plan of Care (Signed)

## 2023-02-13 NOTE — Consult Note (Signed)
 WOC Nurse Consult Note: Reason for Consult: pressure injury from ETT Wound type: mucosal membrane MDRPI Pressure Injury POA: Yes  Measurement:0.2cmx 1.5cm x 0.1cm  Wound azi:hmzb Drainage (amount, consistency, odor) NA Periwound:moist mucosal membrane Dressing procedure/placement/frequency: No real topical care needed, however the use of Vaseline can prevent some pressure and ETT has been removed from ETT and now is taped per respiratory   Discussed POC with patient and bedside nurse.  Re consult if needed, will not follow at this time. Thanks  Claudell [employer]ler [employer], RN,CWOCN, CNS, CWON-AP [phone])

## 2023-02-14 DIAGNOSIS — I480 Paroxysmal atrial fibrillation: Secondary | ICD-10-CM

## 2023-02-14 DIAGNOSIS — G9340 Encephalopathy, unspecified: Secondary | ICD-10-CM | POA: Diagnosis not present

## 2023-02-14 DIAGNOSIS — J9601 Acute respiratory failure with hypoxia: Secondary | ICD-10-CM | POA: Diagnosis not present

## 2023-02-14 DIAGNOSIS — S065XAA Traumatic subdural hemorrhage with loss of consciousness status unknown, initial encounter: Secondary | ICD-10-CM | POA: Diagnosis not present

## 2023-02-14 DIAGNOSIS — I5023 Acute on chronic systolic (congestive) heart failure: Secondary | ICD-10-CM

## 2023-02-14 LAB — BASIC METABOLIC PANEL
Anion gap: 7 (ref 5–15)
BUN: 25 mg/dL — ABNORMAL HIGH (ref 8–23)
CO2: 29 mmol/L (ref 22–32)
Calcium: 8.6 mg/dL — ABNORMAL LOW (ref 8.9–10.3)
Chloride: 100 mmol/L (ref 98–111)
Creatinine, Ser: 1.15 mg/dL — ABNORMAL HIGH (ref 0.44–1.00)
GFR, Estimated: 50 mL/min — ABNORMAL LOW (ref >60)
Glucose, Bld: 124 mg/dL — ABNORMAL HIGH (ref 70–99)
Potassium: 4.8 mmol/L (ref 3.5–5.1)
Sodium: 136 mmol/L (ref 135–145)

## 2023-02-14 LAB — GLUCOSE, CAPILLARY
Glucose-Capillary: 100 mg/dL — ABNORMAL HIGH (ref 70–99)
Glucose-Capillary: 105 mg/dL — ABNORMAL HIGH (ref 70–99)
Glucose-Capillary: 111 mg/dL — ABNORMAL HIGH (ref 70–99)
Glucose-Capillary: 112 mg/dL — ABNORMAL HIGH (ref 70–99)
Glucose-Capillary: 123 mg/dL — ABNORMAL HIGH (ref 70–99)
Glucose-Capillary: 130 mg/dL — ABNORMAL HIGH (ref 70–99)

## 2023-02-14 LAB — AMMONIA: Ammonia: 31 umol/L (ref 9–35)

## 2023-02-14 MED ORDER — NOREPINEPHRINE 4 MG/250ML-% IV SOLN
0.0000 ug/min | INTRAVENOUS | Status: DC
  Administered 2023-02-14: 2 ug/min via INTRAVENOUS
  Administered 2023-02-15: 5 ug/min via INTRAVENOUS
  Administered 2023-02-15: 3 ug/min via INTRAVENOUS
  Administered 2023-02-16: 4 ug/min via INTRAVENOUS
  Filled 2023-02-14 (×4): qty 250

## 2023-02-14 MED ORDER — FUROSEMIDE 10 MG/ML IJ SOLN
40.0000 mg | Freq: Once | INTRAMUSCULAR | Status: AC
  Administered 2023-02-14: 40 mg via INTRAVENOUS
  Filled 2023-02-14: qty 4

## 2023-02-14 NOTE — Progress Notes (Signed)
     Referral received for [name] Finder: goals of care discussion. Chart reviewed.   Patient's daughter [name] called PMT to change meeting time to 02/15/23 at 1200. This will allow all the patient's children to attend. Family is aware we will meet at patient's bedside.   Detailed note and recommendations to follow once GOC has been completed.   Thank you for your referral and allowing PMT to assist in [facility] care.   Stephane Palin, NP Palliative Medicine Team  Team Phone # [phone]   NO CHARGE

## 2023-02-14 NOTE — Progress Notes (Signed)
 NAME:  [name], MRN:  [id number], DOB:  12/20/1945, LOS: 7 ADMISSION DATE:  02/07/2023, CONSULTATION DATE:  02/07/2023 REFERRING MD: Dr Patsey, ED, CHIEF COMPLAINT:  AMS, code stroke   History of Present Illness:  Patient was brought into the hospital following being found unresponsive by her daughter, last known well 730 this morning She was found when daughter came back from work, difficulty breathing, EMS activated.  Was bradycardic, hypotensive on initial evaluation, pinpoint pupils, facial droop.  No response to Narcan Intubated in the ED for airway protection  Daughter is unclear whether she may have had a seizure  Had some chest congestion over the last week Was her usual self this morning when she was last seen   Pertinent  Medical History   Past Medical History:  Diagnosis Date   Acute CHF (congestive heart failure) (HCC) 12/14/2014   Hyperlipidemia    Hypertension    Prosthetic valve dysfunction 07/22/2014   Acute mitral stenosis due to restricted leaflet mobility of bileaflet mechanical mitral valve prosthesis   S/P MVR (mitral valve replacement) 04/11/1998   #29 St. 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  No mass effect, no midline shift.  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  Required intubation for airway protection.  Seen by neurosurgery felt not a candidate for surgery with small acute on chronic subdurals recommended against reversal of warfarin given concern about  basilar artery occlusion.  Seen by neurology, EEG ordered, started on Keppra .  Hypotensive, started on norepinephrine  2/1 EEG suggesting moderate to severe diffuse encephalopathy but no seizure or epileptiform discharges.  Repeat CT head ordered, and showing no new changes 2/2 eeg w areas of increased epileptogenicity + slowing. Off cont sedation. ECHO  w MV gradient change and decr EF 2/3  + RSV; EEG in place which is showing high risk of epileptogenicity but no active seizures; neuro added valproic  acid 2/5 plan to cont through the weekend and re-eval GOC . INR 3.6 not on AC 2/6 recheck INR   Interim History / Subjective:  Patient remains severely encephalopathic Restarted back on low-dose Levophed   Afebrile Serum creatinine continue to trend down  Objective   Blood pressure 90/68, pulse 87, temperature 98.8 F (37.1 C), temperature source Oral, resp. rate 17, height 5' 4 (1.626 m), weight 63 kg, SpO2 97%. CVP:  [3 mmHg-13 mmHg] 8 mmHg  Vent Mode: PRVC FiO2 (%):  [30 %] 30 % Set Rate:  [15 bmp] 15 bmp Vt Set:  [430 mL] 430 mL PEEP:  [5 cmH20] 5 cmH20 Plateau Pressure:  [16 cmH20-22 cmH20] 22 cmH20   Intake/Output Summary (Last 24 hours) at 02/14/2023 1106 Last data filed at 02/14/2023 1000 Gross per 24 hour  Intake 1772.12 ml  Output 600 ml  Net 1172.12 ml   Filed Weights   02/09/23 1653 02/13/23 0457 02/14/23 0427  Weight: 63.4 kg 62.8 kg 63 kg   Examination: General: Acute on chronically ill-appearing [place], orally intubated HEENT: [place]/AT, eyes anicteric.  ETT and OGT in place Neuro: [facility]  eyes with painful stimuli, not tracking examiner, not following commands, localizing painful stimuli Chest: Coarse breath sounds, no wheezes or rhonchi Heart: Regular rate and rhythm, no murmurs or gallops Abdomen: Soft, nondistended, bowel sounds present Skin: No rash  Labs and images reviewed   Resolved Hospital Problem list   Hypothyroidism Lactic acidosis Recurrent  hypoglycemia  Assessment & Plan:  Acute encephalopathy, persistent Acute on chronic small right SDH s/p K Centra and Vit K  Probable seizure activity upon admission Chronic left vertebral and basilar occlusion Vascular dementia  Patient remains severely encephalopathic Repeat head CT showed improvement in small subdural hemorrhage She was started on modafinil  without much improvement EEG showed no active seizures but high risk of epileptogenicity She is on Keppra  and valproic  acid Avoid sedation  Acute respiratory failure with hypoxia RSV pneumonia Possible aspiration pneumonia  s/p 5d rocephin   Continue lung protective ventilation Patient failed his spontaneous breathing trial this morning but her mental status precludes extubation anemia Continue droplet precautions Completed 5-day course of Rocephin  VAP prevention bundle in place Off sedation  Paroxysmal Afib on warfarin  Supratherapeutic INR upon presentation due to warfarin toxicity, status post Kcentra  and vitamin K  S/p bioprosthetic MVR with current moderate mitral stenosis Acute on chronic HFrEF Patient remained in sinus rhythm Anticoagulations are still on hold, now her INR is going back again to 3.7 ECHO showing LVEF 45-50, LV global hypokinesis, moderate Mitral stenosis and mean mitral valve flow gradient of  Will give her 40 mg of Lasix  today Holding Coumadin  as INR is slowly trending up again Holding aspirin  Continue statin  AKI on CKD 3a  Serum creatinine continue to improve, closely monitor  Anemia/Thrombocytopenia of critical illness Monitor CBC, watch for signs of bleeding  Best Practice (right click and Reselect all SmartList Selections daily)   Diet/type: tubefeeds DVT prophylaxis SCD  Pressure ulcer(s): N/A GI prophylaxis: PPI Lines: Central line Foley:  Yes, and it is still needed Code Status:  full code Last date of multidisciplinary goals of care discussion: 2/5: Patient's sons were  updated at bedside, decision was to continue full scope of care until Sunday to see if she improves and wakes up, if not then they will decide tracheostomy versus comfort care.  Palliative care is following   The patient is critically ill due to persistent encephalopathy/acute respiratory failure with hypoxia.  Critical care was necessary to treat or prevent imminent or life-threatening deterioration.  Critical care was time spent personally by me on the following activities: development of treatment plan with patient and/or surrogate as well as nursing, discussions with consultants, evaluation of patient's response to treatment, examination of patient, obtaining history from patient or surrogate, ordering and performing treatments and interventions, ordering and review of laboratory studies, ordering and review of radiographic studies, pulse oximetry, re-evaluation of patient's condition and participation in multidisciplinary rounds.   During this encounter critical care time was devoted to patient care services described in this note for 35 minutes.     Valinda Novas, MD  Pulmonary Critical Care See Amion for pager If no response to pager, please call [phone] until 7pm After 7pm, Please call E-link [phone]

## 2023-02-14 NOTE — Plan of Care (Signed)
  Problem: Clinical Measurements: Goal: Ability to maintain clinical measurements within normal limits will improve Outcome: Progressing Goal: Will remain free from infection Outcome: Progressing Goal: Diagnostic test results will improve Outcome: Progressing Goal: Respiratory complications will improve Outcome: Progressing Goal: Cardiovascular complication will be avoided Outcome: Progressing   Problem: Nutrition: Goal: Adequate nutrition will be maintained Outcome: Progressing   Problem: Coping: Goal: Level of anxiety will decrease Outcome: Progressing   Problem: Elimination: Goal: Will not experience complications related to bowel motility Outcome: Progressing Goal: Will not experience complications related to urinary retention Outcome: Progressing   Problem: Pain Managment: Goal: General experience of comfort will improve and/or be controlled Outcome: Progressing   Problem: Safety: Goal: Ability to remain free from injury will improve Outcome: Progressing   Problem: Skin Integrity: Goal: Risk for impaired skin integrity will decrease Outcome: Progressing

## 2023-02-15 DIAGNOSIS — Z515 Encounter for palliative care: Secondary | ICD-10-CM

## 2023-02-15 DIAGNOSIS — J210 Acute bronchiolitis due to respiratory syncytial virus: Secondary | ICD-10-CM | POA: Diagnosis not present

## 2023-02-15 DIAGNOSIS — Z7189 Other specified counseling: Secondary | ICD-10-CM | POA: Diagnosis not present

## 2023-02-15 DIAGNOSIS — G9340 Encephalopathy, unspecified: Secondary | ICD-10-CM | POA: Diagnosis not present

## 2023-02-15 DIAGNOSIS — I10 Essential (primary) hypertension: Secondary | ICD-10-CM

## 2023-02-15 DIAGNOSIS — F0150 Vascular dementia without behavioral disturbance: Secondary | ICD-10-CM

## 2023-02-15 DIAGNOSIS — J9601 Acute respiratory failure with hypoxia: Secondary | ICD-10-CM | POA: Diagnosis not present

## 2023-02-15 DIAGNOSIS — Z66 Do not resuscitate: Secondary | ICD-10-CM

## 2023-02-15 DIAGNOSIS — Z9911 Dependence on respirator [ventilator] status: Secondary | ICD-10-CM

## 2023-02-15 DIAGNOSIS — S065XAA Traumatic subdural hemorrhage with loss of consciousness status unknown, initial encounter: Secondary | ICD-10-CM | POA: Diagnosis not present

## 2023-02-15 LAB — CBC
HCT: 28.2 % — ABNORMAL LOW (ref 36.0–46.0)
Hemoglobin: 8.5 g/dL — ABNORMAL LOW (ref 12.0–15.0)
MCH: 23.2 pg — ABNORMAL LOW (ref 26.0–34.0)
MCHC: 30.1 g/dL (ref 30.0–36.0)
MCV: 76.8 fL — ABNORMAL LOW (ref 80.0–100.0)
Platelets: 142 10*3/uL — ABNORMAL LOW (ref 150–400)
RBC: 3.67 MIL/uL — ABNORMAL LOW (ref 3.87–5.11)
RDW: 21.5 % — ABNORMAL HIGH (ref 11.5–15.5)
WBC: 6 10*3/uL (ref 4.0–10.5)
nRBC: 0.3 % — ABNORMAL HIGH (ref 0.0–0.2)

## 2023-02-15 LAB — GLUCOSE, CAPILLARY
Glucose-Capillary: 115 mg/dL — ABNORMAL HIGH (ref 70–99)
Glucose-Capillary: 118 mg/dL — ABNORMAL HIGH (ref 70–99)
Glucose-Capillary: 130 mg/dL — ABNORMAL HIGH (ref 70–99)
Glucose-Capillary: 131 mg/dL — ABNORMAL HIGH (ref 70–99)
Glucose-Capillary: 131 mg/dL — ABNORMAL HIGH (ref 70–99)
Glucose-Capillary: 135 mg/dL — ABNORMAL HIGH (ref 70–99)

## 2023-02-15 LAB — BASIC METABOLIC PANEL
Anion gap: 6 (ref 5–15)
BUN: 24 mg/dL — ABNORMAL HIGH (ref 8–23)
CO2: 32 mmol/L (ref 22–32)
Calcium: 8.8 mg/dL — ABNORMAL LOW (ref 8.9–10.3)
Chloride: 97 mmol/L — ABNORMAL LOW (ref 98–111)
Creatinine, Ser: 1.15 mg/dL — ABNORMAL HIGH (ref 0.44–1.00)
GFR, Estimated: 50 mL/min — ABNORMAL LOW (ref >60)
Glucose, Bld: 129 mg/dL — ABNORMAL HIGH (ref 70–99)
Potassium: 4 mmol/L (ref 3.5–5.1)
Sodium: 135 mmol/L (ref 135–145)

## 2023-02-15 MED ORDER — IPRATROPIUM-ALBUTEROL 0.5-2.5 (3) MG/3ML IN SOLN
3.0000 mL | RESPIRATORY_TRACT | Status: DC | PRN
  Administered 2023-02-16: 3 mL via RESPIRATORY_TRACT
  Filled 2023-02-15 (×2): qty 3

## 2023-02-15 MED ORDER — REVEFENACIN 175 MCG/3ML IN SOLN
175.0000 ug | Freq: Every day | RESPIRATORY_TRACT | Status: DC
  Administered 2023-02-15 – 2023-03-03 (×16): 175 ug via RESPIRATORY_TRACT
  Filled 2023-02-15 (×18): qty 3

## 2023-02-15 MED ORDER — HEPARIN SODIUM (PORCINE) 5000 UNIT/ML IJ SOLN
5000.0000 [IU] | Freq: Three times a day (TID) | INTRAMUSCULAR | Status: DC
  Administered 2023-02-15 – 2023-02-27 (×36): 5000 [IU] via SUBCUTANEOUS
  Filled 2023-02-15 (×37): qty 1

## 2023-02-15 MED ORDER — FUROSEMIDE 10 MG/ML IJ SOLN
20.0000 mg | Freq: Once | INTRAMUSCULAR | Status: AC
  Administered 2023-02-15: 20 mg via INTRAVENOUS
  Filled 2023-02-15: qty 2

## 2023-02-15 MED ORDER — ARFORMOTEROL TARTRATE 15 MCG/2ML IN NEBU
15.0000 ug | INHALATION_SOLUTION | Freq: Two times a day (BID) | RESPIRATORY_TRACT | Status: DC
  Administered 2023-02-15 – 2023-03-03 (×30): 15 ug via RESPIRATORY_TRACT
  Filled 2023-02-15 (×32): qty 2

## 2023-02-15 MED ORDER — BUDESONIDE 0.5 MG/2ML IN SUSP
0.5000 mg | Freq: Two times a day (BID) | RESPIRATORY_TRACT | Status: DC
  Administered 2023-02-15 – 2023-03-03 (×32): 0.5 mg via RESPIRATORY_TRACT
  Filled 2023-02-15 (×33): qty 2

## 2023-02-15 NOTE — IPAL (Signed)
  Interdisciplinary Goals of Care Family Meeting   Date carried out: 02/15/2023  Location of the meeting: Conference room  Member's involved: Nurse Practitioner, Family Member or next of kin, and Palliative care team member  Durable Power of Attorney or acting medical decision maker: Daughter [name]     Discussion: We discussed goals of care for [name] .  [name] + her two brothers, myself and [name] with palliative care discussed [name]'s case. She has had slow but ongoing neurologic improvements, and it is difficult to know what her new baseline may be.  From a pulmonary perspective I think we are nearly optimized and nearing extubation readiness. With pts baseline cognitive deficits I do not think it is realistic that we will have ideal mentation for extubation.   I recommended considering a 1-way extubation and DNR/I status. She has been intubated x7 days, and does not have a pulm reason for ETT at this point + has had improving mental status now with more alertness and findings suggestive that she has capacity to protect her airway. I worry that prolonging MV much longer for the sake of following neuro status will likely produce vent associated complications. Reintubation would similarly be complicated, as we would be initially re-sedating and chemically paralyzing her, and needing ample time for medication washout again.   I have recommended consideration of DNR/I status as well. We talked about what resuscitation entails, and family agrees that DNR status is most appropriate. I reiterated that code status has no bearing on her medical treatment otherwise.    They are discussing the timing of a 1-way extubation. We will focus on pulm optimization.  DNR     Code status:   Code Status: Limited: Do not attempt resuscitation (DNR) -DNR-LIMITED -Do Not Intubate/DNI    Disposition: Continue current acute care  Time spent for the meeting: 45 min    [name] Gave, NP  02/15/2023, 1:16 PM

## 2023-02-15 NOTE — Plan of Care (Signed)

## 2023-02-15 NOTE — Consult Note (Signed)
 Palliative Care Consult Note                                  Date: 02/15/2023   Patient Name: [name]  DOB: [birth year]  MRN: [id number]  Age / Sex: 78 y.o., female  PCP: Rollene Almarie LABOR, MD Referring Physician: Gretta Leita SQUIBB, DO  Reason for Consultation: Establishing goals of care  HPI/Patient Profile: 78 y.o. female  with past medical history of mitral valve replacement, a-fib on warfarin, endocarditis, hypertension, and vascular dementia  admitted on 02/07/2023 after being found unresponsive by her daughter.   On arrival to the ED, she was noted to have pinpoint pupils, left hemiparesis and right gaze deviation. She was intubated for airway protection. 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 MRI brain revealed no stroke. Patient was given Ativan  and loaded with Keppra . Neurosurgery felt that the small size of the subdural hematoma did not warrant surgical intervention.   Past Medical History:  Diagnosis Date   Acute CHF (congestive heart failure) (HCC) 12/14/2014   Hyperlipidemia    Hypertension    Prosthetic valve dysfunction 07/22/2014   Acute mitral stenosis due to restricted leaflet mobility of bileaflet mechanical mitral valve prosthesis   S/P MVR (mitral valve replacement) 04/11/1998   #29 St. 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  I introduced Palliative Medicine as specialized medical care for people living with serious illness. It focuses on providing relief from symptoms and stress of a serious illness. The goal is to improve quality of life for both the patient and the family.  Today's Discussion: Family has a good understanding of the patient's chronic illnesses and acute hospitalization. The patient lived with her daughter Annabella prior to admission. 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 She has always loved going to church. Her health and functional status have worsened over the last few years.  Ronnald Gave reviewed the patient's current hospitalization discussing both her respiratory and neurological status. We discussed the importance of extubating the patient when she is optimized. Ronnald shared that she believed the patient were close to optimization and the importance of having a plan in place. We discussed one way extubation. The family are discussing and will give us  a time for one way extubation. Ronnald discussed the code status and family agrees to DNR status.  Discussed the importance of continued conversation with family and the medical providers regarding overall plan of care and treatment options, ensuring decisions are within the context  of the patient's values and GOCs.  Questions and concerns were addressed. Hard Choices booklet left for review. The family was encouraged to call with questions or concerns. PMT will continue to support holistically.  Review of Systems  Unable to perform ROS   Objective:   Primary Diagnoses: Present on Admission:  Encephalopathy acute   Physical Exam Vitals reviewed.  Constitutional:      General: She is awake.     Appearance: She is ill-appearing.     Interventions: She is intubated.  HENT:     Head: Normocephalic and atraumatic.  Cardiovascular:     Rate and Rhythm: Normal rate.   Pulmonary:     Effort: She is intubated.  Neurological:     Mental Status: She is alert.     Vital Signs:  BP (!) 89/67   Pulse 89   Temp 98.4 F (36.9 C) (Oral)   Resp 19   Ht 5' 4 (1.626 m)   Wt 61.7 kg   LMP  (LMP Unknown)   SpO2 100%   BMI 23.35 kg/m   Palliative Assessment/Data: 30% with feeds    Advanced Care Planning:   Existing Vynca/ACP Documentation: None  Primary Decision Maker: NEXT OF KIN. Patient's three children.  Code Status/Advance Care Planning: DNR   Assessment & Plan:   SUMMARY OF RECOMMENDATIONS   DNR Family considering timing of one way extubation Continued PMT support  Discussed with: bedside RN and Ronnald Gave NP  Time Total: 75 minutes    Thank you for allowing us  to participate in the care of Rakeb E Vanice Finder PMT will continue to support holistically.   Signed by: Stephane Palin, NP Palliative Medicine Team  Team Phone # [phone] (Nights/Weekends)  02/15/2023, 1:30 PM

## 2023-02-15 NOTE — Progress Notes (Signed)
 NAME:  [name], MRN:  [id number], DOB:  1945/06/01, LOS: 8 ADMISSION DATE:  02/07/2023, CONSULTATION DATE:  02/07/2023 REFERRING MD: Dr Patsey, ED, CHIEF COMPLAINT:  AMS, code stroke   History of Present Illness:  Patient was brought into the hospital following being found unresponsive by her daughter, last known well 730 this morning She was found when daughter came back from work, difficulty breathing, EMS activated.  Was bradycardic, hypotensive on initial evaluation, pinpoint pupils, facial droop.  No response to Narcan Intubated in the ED for airway protection  Daughter is unclear whether she may have had a seizure  Had some chest congestion over the last week Was her usual self this morning when she was last seen   Pertinent  Medical History   Past Medical History:  Diagnosis Date   Acute CHF (congestive heart failure) (HCC) 12/14/2014   Hyperlipidemia    Hypertension    Prosthetic valve dysfunction 07/22/2014   Acute mitral stenosis due to restricted leaflet mobility of bileaflet mechanical mitral valve prosthesis   S/P MVR (mitral valve replacement) 04/11/1998   #29 St. 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  No mass effect, no midline shift.  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  Required intubation for airway protection.  Seen by neurosurgery felt not a candidate for surgery with small acute on chronic subdurals recommended against reversal of warfarin given concern about  basilar artery occlusion.  Seen by neurology, EEG ordered, started on Keppra .  Hypotensive, started on norepinephrine  2/1 EEG suggesting moderate to severe diffuse encephalopathy but no seizure or epileptiform discharges.  Repeat CT head ordered, and showing no new changes 2/2 eeg w areas of increased epileptogenicity + slowing. Off cont sedation. ECHO  w MV gradient change and decr EF 2/3  + RSV; EEG in place which is showing high risk of epileptogenicity but no active seizures; neuro added valproic  acid 2/5 plan to cont through the weekend and re-eval GOC . INR 3.6 not on AC 2/6 recheck INR  2/8 more awake. Met with family. DNR. Discussing possible 1-way extubation   Interim History / Subjective:  Stable labs   Objective   Blood pressure (!) 89/67, pulse 89, temperature 98.4 F (36.9 C), temperature source Oral, resp. rate 19, height 5' 4 (1.626 m), weight 61.7 kg, SpO2 100%. CVP:  [2 mmHg-50 mmHg] 7 mmHg  Vent Mode: CPAP;PSV FiO2 (%):  [30 %] 30 % Set Rate:  [15 bmp] 15 bmp Vt Set:  [430 mL] 430 mL PEEP:  [5 cmH20] 5 cmH20 Pressure Support:  [5 cmH20] 5 cmH20 Plateau Pressure:  [13 cmH20-16 cmH20] 13 cmH20   Intake/Output Summary (Last 24 hours) at 02/15/2023 1606 Last data filed at 02/15/2023 1400 Gross per 24 hour  Intake 1852.84 ml  Output 3270 ml  Net -1417.16 ml   Filed Weights   02/13/23 0457 02/14/23 0427 02/15/23 0515  Weight: 62.8 kg 63 kg 61.7 kg   Examination: General: Critically and chronically ill appearing F intubated NAD  HEENT: [place]/AT, eyes anicteric.  ETT and OGT in place Neuro:  Awake. Moving spontaneously. Tracking. Indeterminately following commands. + gaga cough, spontaneously breathing  Chest: Mechanically ventilated. Coarse. Deep wheezes Symmetrical chest expansion, no accessory use on PSV   Heart: rrr s1s2  Abdomen: soft ndnt  Skin: c/d/w  Resolved Hospital Problem list   Hypothyroidism Lactic acidosis Recurrent hypoglycemia Possible aspiration  pneumonia  s/p 5d rocephin   Assessment & Plan:   Acute encephalopathy, improving  Acute on chronic small right SDH s/p K Centra and Vit K  Probable seizure activity upon admission Chronic left vertebral and basilar occlusion Vascular dementia  P -delirium precautions -cont AEDs -provigil    Acute respiratory failure with hypoxia RSV PNA  P -cont PSV efforts, she is doing well 2/8 -VAP, pulm hygiene -I do not think we can expect her mental status to be optimal for extubation, and I think we are nearing-if-not-at optimization for extubation re her pulm mechanics.  -I have rec 1-way extubation, DNR/I-- family is contemplating timing  -nebs  -will give 20 lasix  but think her volume status is probably nearly optimal   pAF on warfarin Supratheraperutic INR, warfarin toxicity S/p bioprosthetic MVR, current MS AoC HFrEF  Patient remained in sinus rhythm Anticoagulations are still on hold, now her INR is going back again to 3.7 ECHO showing LVEF 45-50, LV global hypokinesis, moderate Mitral stenosis and mean mitral valve flow gradient of  Will give her 40 mg of Lasix  today Holding Coumadin  as INR is slowly trending up again Holding aspirin  Continue statin  Hypotension -on low dose NE + midodrine    AKI on CKD 3a  Serum creatinine continue to improve, closely monitor  Anemia/Thrombocytopenia of critical illness Monitor CBC, watch for signs of bleeding  Best Practice (right click and Reselect all SmartList Selections daily)   Diet/type: tubefeeds DVT prophylaxis SCD  Pressure ulcer(s): N/A GI prophylaxis: PPI Lines: Central line Foley:  Yes, and it is still needed Code Status:  full code Last date of multidisciplinary goals of care discussion: 2/5: Patient's sons were updated at bedside, decision was to continue full scope of care until Sunday to see if she improves and wakes up, if not then they will decide tracheostomy versus comfort care.  Palliative care is  following   CRITICAL CARE Performed by: Ronnald FORBES Gave   Total critical care time: 40 minutes  Critical care time was exclusive of separately billable procedures and treating other patients. Critical care was necessary to treat or prevent imminent or life-threatening deterioration.  Critical care was time spent personally by me on the following activities: development of treatment plan with patient and/or surrogate as well as nursing, discussions with consultants, evaluation of patient's response to treatment, examination of patient, obtaining history from patient or surrogate, ordering and performing treatments and interventions, ordering and review of laboratory studies, ordering and review of radiographic studies, pulse oximetry and re-evaluation of patient's condition.  Ronnald Gave MSN, AGACNP-BC [place] [facility]/Critical Care Medicine Amion for pager  02/15/2023, 4:06 PM

## 2023-02-16 DIAGNOSIS — J210 Acute bronchiolitis due to respiratory syncytial virus: Secondary | ICD-10-CM | POA: Diagnosis not present

## 2023-02-16 DIAGNOSIS — G9340 Encephalopathy, unspecified: Secondary | ICD-10-CM | POA: Diagnosis not present

## 2023-02-16 DIAGNOSIS — S065XAA Traumatic subdural hemorrhage with loss of consciousness status unknown, initial encounter: Secondary | ICD-10-CM | POA: Diagnosis not present

## 2023-02-16 DIAGNOSIS — J9601 Acute respiratory failure with hypoxia: Secondary | ICD-10-CM | POA: Diagnosis not present

## 2023-02-16 LAB — BASIC METABOLIC PANEL
Anion gap: 8 (ref 5–15)
BUN: 20 mg/dL (ref 8–23)
CO2: 33 mmol/L — ABNORMAL HIGH (ref 22–32)
Calcium: 8.7 mg/dL — ABNORMAL LOW (ref 8.9–10.3)
Chloride: 94 mmol/L — ABNORMAL LOW (ref 98–111)
Creatinine, Ser: 1.06 mg/dL — ABNORMAL HIGH (ref 0.44–1.00)
GFR, Estimated: 55 mL/min — ABNORMAL LOW (ref >60)
Glucose, Bld: 147 mg/dL — ABNORMAL HIGH (ref 70–99)
Potassium: 3.9 mmol/L (ref 3.5–5.1)
Sodium: 135 mmol/L (ref 135–145)

## 2023-02-16 LAB — GLUCOSE, CAPILLARY
Glucose-Capillary: 134 mg/dL — ABNORMAL HIGH (ref 70–99)
Glucose-Capillary: 118 mg/dL — ABNORMAL HIGH (ref 70–99)
Glucose-Capillary: 111 mg/dL — ABNORMAL HIGH (ref 70–99)
Glucose-Capillary: 129 mg/dL — ABNORMAL HIGH (ref 70–99)
Glucose-Capillary: 175 mg/dL — ABNORMAL HIGH (ref 70–99)

## 2023-02-16 MED ORDER — FENTANYL CITRATE PF 50 MCG/ML IJ SOSY: 25.0000 ug | PREFILLED_SYRINGE | INTRAMUSCULAR | Status: DC | PRN

## 2023-02-16 MED ORDER — MAGNESIUM SULFATE 2 GM/50ML IV SOLN
2.0000 g | Freq: Once | INTRAVENOUS | Status: AC
  Administered 2023-02-16: 2 g via INTRAVENOUS
  Filled 2023-02-16: qty 50

## 2023-02-16 MED ORDER — POTASSIUM CHLORIDE 20 MEQ PO PACK
40.0000 meq | PACK | Freq: Once | ORAL | Status: AC
  Administered 2023-02-16: 40 meq
  Filled 2023-02-16: qty 2

## 2023-02-16 MED ORDER — METHYLPREDNISOLONE SODIUM SUCC 40 MG IJ SOLR
40.0000 mg | Freq: Two times a day (BID) | INTRAMUSCULAR | Status: DC
  Administered 2023-02-16 – 2023-02-19 (×7): 40 mg via INTRAVENOUS
  Filled 2023-02-16 (×7): qty 1

## 2023-02-16 MED ORDER — FUROSEMIDE 10 MG/ML IJ SOLN
20.0000 mg | Freq: Once | INTRAMUSCULAR | Status: AC
  Administered 2023-02-16: 20 mg via INTRAVENOUS
  Filled 2023-02-16: qty 2

## 2023-02-16 NOTE — Plan of Care (Signed)
  Problem: Education: Goal: Knowledge of General Education information will improve Description: Including pain rating scale, medication(s)/side effects and non-pharmacologic comfort measures Outcome: Progressing   Problem: Clinical Measurements: Goal: Will remain free from infection Outcome: Progressing   Problem: Activity: Goal: Risk for activity intolerance will decrease Outcome: Progressing   Problem: Coping: Goal: Level of anxiety will decrease Outcome: Progressing   Problem: Pain Managment: Goal: General experience of comfort will improve and/or be controlled Outcome: Progressing   Problem: Safety: Goal: Ability to remain free from injury will improve Outcome: Progressing

## 2023-02-16 NOTE — Progress Notes (Signed)
 NAME:  [name], MRN:  [id number], DOB:  [birth year], LOS: 9 ADMISSION DATE:  02/07/2023, CONSULTATION DATE:  02/07/2023 REFERRING MD: Dr Patsey, ED, CHIEF COMPLAINT:  AMS, code stroke   History of Present Illness:  Patient was brought into the hospital following being found unresponsive by her daughter, last known well 730 this morning She was found when daughter came back from work, difficulty breathing, EMS activated.  Was bradycardic, hypotensive on initial evaluation, pinpoint pupils, facial droop.  No response to Narcan Intubated in the ED for airway protection  Daughter is unclear whether she may have had a seizure  Had some chest congestion over the last week Was her usual self this morning when she was last seen  Pertinent  Medical History   Past Medical History:  Diagnosis Date   Acute CHF (congestive heart failure) (HCC) 12/14/2014   Hyperlipidemia    Hypertension    Prosthetic valve dysfunction 07/22/2014   Acute mitral stenosis due to restricted leaflet mobility of bileaflet mechanical mitral valve prosthesis   S/P MVR (mitral valve replacement) 04/11/1998   #29 St. 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  No mass effect, no midline shift.  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  Required intubation for airway protection.  Seen by neurosurgery felt not a candidate for surgery with small acute on chronic subdurals recommended against reversal of warfarin given concern about  basilar artery occlusion.  Seen by neurology, EEG ordered, started on Keppra .  Hypotensive, started on norepinephrine  2/1 EEG suggesting moderate to severe diffuse encephalopathy but no seizure or epileptiform discharges.  Repeat CT head ordered, and showing no new changes 2/2 eeg w areas of increased epileptogenicity + slowing. Off cont sedation. ECHO  w MV gradient change and decr EF 2/3  + RSV; EEG in place which is showing high risk of epileptogenicity but no active seizures; neuro added valproic  acid 2/5 plan to cont through the weekend and re-eval GOC . INR 3.6 not on AC 2/6 recheck INR  2/8 more awake. Met with family. DNR. Discussing possible 1-way extubation   Interim History / Subjective:  BMP stable  Son at bedside  Objective   Blood pressure (!) 86/59, pulse 92, temperature 98.5 F (36.9 C), temperature source Oral, resp. rate 15, height 5' 4 (1.626 m), weight 58.6 kg, SpO2 100%.    Vent Mode: PRVC FiO2 (%):  [30 %] 30 % Set Rate:  [15 bmp] 15 bmp Vt Set:  [430 mL] 430 mL PEEP:  [5 cmH20] 5 cmH20 Pressure Support:  [5 cmH20] 5 cmH20 Plateau Pressure:  [15 cmH20-16 cmH20] 16 cmH20   Intake/Output Summary (Last 24 hours) at 02/16/2023 1110 Last data filed at 02/16/2023 0800 Gross per 24 hour  Intake 1770.08 ml  Output 1810 ml  Net -39.92 ml   Filed Weights   02/14/23 0427 02/15/23 0515 02/16/23 0500  Weight: 63 kg 61.7 kg 58.6 kg   Examination: General: Chronically and acutely ill older adult F NAD  HEENT: NCAT pink mm ETT secure Neuro:  Awakens, moving spontaneously. Drowsy  Pulm: deep low pitched wheeze. Even and unlabored on PSV 5/5 with good Vt Heart: rr s1s. systolic murmur.  Abdomen: soft ndnt  Skin: c/d/w   Resolved Hospital Problem list   Hypothyroidism Lactic acidosis Recurrent hypoglycemia Possible aspiration pneumonia  s/p 5d rocephin   Assessment & Plan:   GOC DNR status -see ipal 2/8.  -plan is for 1-way extubation, with hope that she will fare  ok and continue to await possible further neuro improvements. -I think we are likely optimized from a pulm standpoint and not sure that we can expect traditional mentation metrics when assessing for extubation. We've talked about the detriment of staying in vent longer than indicated.  - Family is contemplating timing, likely 2/10  -DNR   Acute encephalopathy, improving R SDH s/p kcentra , Vit K Probable OOH seizure r/t above Chronic L vertebral and basilar artery occlusion Vascular dementia P -delirium precautions -cont AEDs -provigil    Acute respiratory failure with hypoxia RSV PNA  P -cont PSV efforts, she is doing well 2/8 -VAP, pulm hygiene -nebs -will add solumedrol 2/9 to see if we can further help the RSV related wheeze   pAF on warfarin Supratheraperutic INR, warfarin toxicity S/p bioprosthetic MVR, current MS AoC HFrEF  P -remains off of systemic AC  -pending course, may need cards consult RE stenosis of her bioprosthetic valve -cont tele monitoring -optimize lytes -- will giver her 2g mag and 40mEq K 2/9, but lyte patterns have been pretty stable and we can likely move to every other day BMP   Hypotension -on low dose NE + midodrine    AKI on CKD 3a  -follow BMP PRN   Anemia Thrombocytopenia -follow PRN   Best Practice (right click and Reselect all SmartList Selections daily)   Diet/type: tubefeeds DVT prophylaxis SCD SQH  Pressure ulcer(s): N/A GI prophylaxis: PPI Lines: Central line Foley:  N/A Code Status:  DNR Last date of multidisciplinary goals of care discussion: 2/8 -- DNR, planning for 1-way extubation w optimized pulm status. Timing TBD. Likely 2/10   CRITICAL CARE Performed by: Ronnald FORBES Gave   Total critical care time: 49 minutes  Critical care time was exclusive of separately billable procedures and treating other patients. Critical care was necessary to treat or prevent imminent or life-threatening deterioration.  Critical care  was time spent personally by me on the following activities: development of treatment plan with patient and/or surrogate as well as nursing, discussions with consultants, evaluation of patient's response to treatment, examination of patient, obtaining history from patient or surrogate, ordering and performing treatments and interventions, ordering and review of laboratory studies, ordering and review of radiographic studies, pulse oximetry and re-evaluation of patient's condition.  Ronnald Gave MSN, AGACNP-BC [place] [facility]/Critical Care Medicine Amion for pager  02/16/2023, 11:10 AM

## 2023-02-16 NOTE — Plan of Care (Signed)
 Notified by RN that family had reached out and has decided on 1-way extubation 2/10 around 0900  Will pass this along to team coming on tomorrow  For today, will focus on further pulm optimization as outlined in prog note. Tried to reach daughter via phone to provide updates and answer questions, unable to reach --left compliant VM.    Ronnald Gave MSN, AGACNP-BC [facility] Pulmonary/Critical Care Medicine 02/16/2023, 11:39 AM

## 2023-02-17 DIAGNOSIS — I5022 Chronic systolic (congestive) heart failure: Secondary | ICD-10-CM | POA: Diagnosis not present

## 2023-02-17 DIAGNOSIS — N1832 Chronic kidney disease, stage 3b: Secondary | ICD-10-CM

## 2023-02-17 DIAGNOSIS — F0150 Vascular dementia without behavioral disturbance: Secondary | ICD-10-CM | POA: Diagnosis not present

## 2023-02-17 DIAGNOSIS — Z515 Encounter for palliative care: Secondary | ICD-10-CM | POA: Diagnosis not present

## 2023-02-17 DIAGNOSIS — Z7189 Other specified counseling: Secondary | ICD-10-CM | POA: Diagnosis not present

## 2023-02-17 DIAGNOSIS — E46 Unspecified protein-calorie malnutrition: Secondary | ICD-10-CM | POA: Diagnosis not present

## 2023-02-17 DIAGNOSIS — J210 Acute bronchiolitis due to respiratory syncytial virus: Secondary | ICD-10-CM | POA: Diagnosis not present

## 2023-02-17 DIAGNOSIS — J9601 Acute respiratory failure with hypoxia: Secondary | ICD-10-CM | POA: Diagnosis not present

## 2023-02-17 LAB — BASIC METABOLIC PANEL
Anion gap: 12 (ref 5–15)
BUN: 23 mg/dL (ref 8–23)
CO2: 31 mmol/L (ref 22–32)
Calcium: 9.5 mg/dL (ref 8.9–10.3)
Chloride: 93 mmol/L — ABNORMAL LOW (ref 98–111)
Creatinine, Ser: 1.22 mg/dL — ABNORMAL HIGH (ref 0.44–1.00)
GFR, Estimated: 46 mL/min — ABNORMAL LOW (ref >60)
Glucose, Bld: 160 mg/dL — ABNORMAL HIGH (ref 70–99)
Potassium: 4.6 mmol/L (ref 3.5–5.1)
Sodium: 136 mmol/L (ref 135–145)

## 2023-02-17 LAB — GLUCOSE, CAPILLARY
Glucose-Capillary: 117 mg/dL — ABNORMAL HIGH (ref 70–99)
Glucose-Capillary: 124 mg/dL — ABNORMAL HIGH (ref 70–99)
Glucose-Capillary: 135 mg/dL — ABNORMAL HIGH (ref 70–99)
Glucose-Capillary: 147 mg/dL — ABNORMAL HIGH (ref 70–99)
Glucose-Capillary: 198 mg/dL — ABNORMAL HIGH (ref 70–99)

## 2023-02-17 MED ORDER — SODIUM CHLORIDE 0.9 % IV SOLN: INTRAVENOUS | Status: AC | PRN

## 2023-02-17 MED ORDER — DONEPEZIL HCL 10 MG PO TABS
10.0000 mg | ORAL_TABLET | Freq: Every day | ORAL | Status: DC
  Filled 2023-02-17 (×3): qty 1

## 2023-02-17 MED ORDER — ORAL CARE MOUTH RINSE
15.0000 mL | OROMUCOSAL | Status: DC
  Administered 2023-02-17 – 2023-03-04 (×59): 15 mL via OROMUCOSAL

## 2023-02-17 MED ORDER — DOCUSATE SODIUM 50 MG/5ML PO LIQD: 100.0000 mg | Freq: Two times a day (BID) | ORAL | Status: DC | PRN

## 2023-02-17 MED ORDER — GLYCOPYRROLATE 0.2 MG/ML IJ SOLN
0.1000 mg | Freq: Three times a day (TID) | INTRAMUSCULAR | Status: DC | PRN
  Administered 2023-02-17 – 2023-03-01 (×2): 0.1 mg via INTRAVENOUS
  Filled 2023-02-17 (×4): qty 1

## 2023-02-17 MED ORDER — POLYETHYLENE GLYCOL 3350 17 G PO PACK: 17.0000 g | PACK | Freq: Every day | ORAL | Status: DC | PRN

## 2023-02-17 MED ORDER — ATORVASTATIN CALCIUM 40 MG PO TABS: 40.0000 mg | ORAL_TABLET | Freq: Every day | ORAL | Status: DC

## 2023-02-17 MED ORDER — MODAFINIL 100 MG PO TABS: 200.0000 mg | ORAL_TABLET | Freq: Every day | ORAL | Status: DC

## 2023-02-17 MED ORDER — SODIUM CHLORIDE 0.9 % IV SOLN: INTRAVENOUS | Status: DC | PRN

## 2023-02-17 MED ORDER — ORAL CARE MOUTH RINSE
15.0000 mL | OROMUCOSAL | Status: DC | PRN
  Administered 2023-03-01: 15 mL via OROMUCOSAL

## 2023-02-17 MED ORDER — MIDODRINE HCL 5 MG PO TABS: 10.0000 mg | ORAL_TABLET | Freq: Three times a day (TID) | ORAL | Status: DC

## 2023-02-17 MED ORDER — PANTOPRAZOLE SODIUM 40 MG IV SOLR
40.0000 mg | INTRAVENOUS | Status: DC
  Administered 2023-02-17 – 2023-03-03 (×15): 40 mg via INTRAVENOUS
  Filled 2023-02-17 (×15): qty 10

## 2023-02-17 NOTE — Plan of Care (Addendum)
 Patient remains on [facility]-2H/CVICU at time of writing. Patient is status-post ETT extubation today and remains on supplemental O2 via [place] @ 4 L / min. NIHSS = 19. Patient remains NPO. CVC to [facility]. No continuous infusions at this time.  2210 hours: Call received from [facility] from [facility]; updated their team on patient's status.   Problem: Clinical Measurements: Goal: Ability to maintain clinical measurements within normal limits will improve Outcome: Progressing Goal: Will remain free from infection Outcome: Progressing Goal: Diagnostic test results will improve Outcome: Progressing Goal: Respiratory complications will improve Outcome: Progressing Goal: Cardiovascular complication will be avoided Outcome: Progressing   Problem: Pain Managment: Goal: General experience of comfort will improve and/or be controlled Outcome: Progressing   Problem: Safety: Goal: Ability to remain free from injury will improve Outcome: Progressing

## 2023-02-17 NOTE — Plan of Care (Signed)

## 2023-02-17 NOTE — Progress Notes (Signed)
 SLP Cancellation Note  Patient Details Name: Saraih Griesemer MRN: [id number] DOB: 1945/04/08   Cancelled treatment:       Reason Eval/Treat Not Completed: Medical issues which prohibited therapy. Patient just 1-way extubated. Per RN, not cognitively appropriate for swallow eval in addition to requiring likely NT suctioning. Will f/u next date.   Delsa Fife MA, CCC-SLP   [place] Burgin Meryl 02/17/2023, 10:41 AM

## 2023-02-17 NOTE — Progress Notes (Signed)
 Palliative:  HPI: 78 y.o. female  with past medical history of mitral valve replacement, a-fib on warfarin, endocarditis, hypertension, and vascular dementia  admitted on 02/07/2023 after being found unresponsive by her daughter. On arrival to the ED, she was noted to have pinpoint pupils, left hemiparesis and right gaze deviation. She was intubated for airway protection. 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 MRI brain revealed no stroke. Patient was given Ativan  and loaded with Keppra  for potential seizure. Neurosurgery felt that the small size of the subdural hematoma did not warrant surgical intervention. 1/31 intubated for airway protection. 2/3 +RSV. PCCM with ongoing GOC conversations and plans for one way extubation 2/10.  I met today at Ms. Laree's bedside along with her children, [name] and [name], as well as Dr. Fulton Job. Plans reviewed and confirmed to proceed with one way extubation. Ms. Nazanin is weaning well at this time. Family are hopeful for improvement after extubation but also clear that they would not plan to re-intubate. If she declines after intubation they agree with comfort medications to minimize suffering. We reviewed hopes that she will maintain off ventilator support but time for outcomes. We discussed that the first hurdle is to maintain adequate breathing on her own and then the next will be eating/drinking/nutrition. Explained that goal today is to get off ventilator and monitor closely for tolerance.   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 She has lived with [name] (and [name]'s 46 yo daughter) for past ~7 years. [name] reports that her mother is functional but forgetful and needs assistance with direction and cues as well as assistance with dressing. Able to walk without assistance or assist devices. No problems with eating or swallowing prior to  admission.   I was present during extubation. Ms. Amaranta tolerated well. Breathing is stable but not verbal or able to expectorate at this time. Family at bedside trying to encourage her to cough. Ms. Zoà is tired and appears to have poor reserve. I did express to family that I do not anticipate that she will be able to eat/drink today and that we can re-consider and re-assess abilities and readiness tomorrow. Family agrees with ongoing palliative support and we will continue conversations based on progress.   All questions/concerns addressed. Emotional support provided.   Exam: Extubated to [place] - tolerated well. Sleepy. Not following commands or coughing to command. No verbal response. No distress. Breathing regular, unlabored with sats 100% on 4L [place]. Abd soft. Generalized weakness and fatigue.   Plan: - DNR/DNI - Comfort if she declines off ventilator - Time for outcomes - family hopeful that she can continue to improve - Ongoing palliative support  55 min  Vila Grayer, NP Palliative Medicine Team Pager [phone] (Please see [email or web address] for schedule) Team Phone [phone]

## 2023-02-17 NOTE — Progress Notes (Signed)
 Patient extubated per MD order at 0932 with RN and family at bedside. Patient placed on 4L nasal cannula. Tolerated well, vitals stable.

## 2023-02-17 NOTE — Progress Notes (Signed)
 NAME:  [name], MRN:  [id number], DOB:  May 18, 1945, LOS: 10 ADMISSION DATE:  02/07/2023, CONSULTATION DATE:  02/07/2023 REFERRING MD: Dr Val Garin, ED, CHIEF COMPLAINT:  AMS, code stroke   History of Present Illness:  Patient was brought into the hospital following being found unresponsive by her daughter, last known well 730 this morning She was found when daughter came back from work, difficulty breathing, EMS activated.  Was bradycardic, hypotensive on initial evaluation, pinpoint pupils, facial droop.  No response to Narcan Intubated in the ED for airway protection  Daughter is unclear whether she may have had a seizure  Had some chest congestion over the last week Was her usual self this morning when she was last seen  Pertinent  Medical History   Past Medical History:  Diagnosis Date   Acute CHF (congestive heart failure) (HCC) 12/14/2014   Hyperlipidemia    Hypertension    Prosthetic valve dysfunction 07/22/2014   Acute mitral stenosis due to restricted leaflet mobility of bileaflet mechanical mitral valve prosthesis   S/P MVR (mitral valve replacement) 04/11/1998   #29 St. 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  No mass effect, no midline shift.  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  Required intubation for airway protection.  Seen by neurosurgery felt not a candidate for surgery with small acute on chronic subdurals recommended against reversal of warfarin given concern about  basilar artery occlusion.  Seen by neurology, EEG ordered, started on Keppra .  Hypotensive, started on norepinephrine  2/1 EEG suggesting moderate to severe diffuse encephalopathy but no seizure or epileptiform discharges.  Repeat CT head ordered, and showing no new changes 2/2 eeg w areas of increased epileptogenicity + slowing. Off cont sedation. ECHO  w MV gradient change and decr EF 2/3  + RSV; EEG in place which is showing high risk of epileptogenicity but no active seizures; neuro added valproic  acid 2/5 plan to cont through the weekend and re-eval GOC . INR 3.6 not on AC 2/6 recheck INR  2/8 more awake. Met with family. DNR. Discussing possible 1-way extubation   Interim History / Subjective:  No acute events overnight. Son and daughter at bedside today for extubation.   Objective   Blood pressure 93/64, pulse 78, temperature (!) 96.3 F (35.7 C), temperature source Axillary, resp. rate (!) 22, height 5\' 4"  (1.626 m), weight 61.8 kg, SpO2 100%.    Vent Mode: PRVC FiO2 (%):  [30 %] 30 % Set Rate:  [15 bmp] 15 bmp Vt Set:  [430 mL] 430 mL PEEP:  [5 cmH20] 5 cmH20 Plateau Pressure:  [16 cmH20-20 cmH20] 17 cmH20   Intake/Output Summary (Last 24 hours) at 02/17/2023 0903 Last data filed at 02/17/2023 0753 Gross per 24 hour  Intake 1884.31 ml  Output 2080 ml  Net -195.69 ml   Filed Weights   02/15/23 0515 02/16/23 0500 02/17/23 0703  Weight: 61.7 kg 58.6 kg 61.8 kg   Examination: General: chronically ill, frail appearing woman lying in bed intubated, not sedated HEENT: [place]/AT, eyes anicteric,  ETT, OT Neuro:  arouses to verbal stimulation, not following commands.  Pulm: less wheezing, breathing comfortably on PS 8 Heart: S1S2, RRR Abdomen: soft, NT, ND  Skin: warm, dry, no rashes  BUN 23 Cr 1.22    Resolved Hospital Problem list    Lactic acidosis Recurrent hypoglycemia Possible aspiration pneumonia  s/p 5d rocephin   Assessment & Plan:   Acute respiratory failure  with hypoxia due to RSV infection Wheezing due to RSV   -Con't steroids and triple inhaled therapy + PRN duonebs -LTVV -SAT & SBT; planning to extubate today with children at bedside. We reviewed again that we are hopeful for success, but there is no way to predict if she will be successful. Plan is not to reintubate, so if she is failing post-extubation, we would transition to comfort focused care. Confirmed with daughter & son with RN & Palliative care team at bedside this morning.  -d/c fentanyl , sedation orders -supplemental O2 to maintain SpO2 >90%  Acute encephalopathy with baseline vascular dementia New-onset seizures SDH -con't keppra  and valproate> keep IV since NGT out -con't modafinil  and aricept  -family at bedside -PT, OT, SLP   Hypotension -con't midodrine , but will lose OGT with extubation -NE to maintain SBP >65 -con't holding PTA coreg  and hydralazine    Moderate protein energy malnutrition -holding TF; swallow study after extubation -SLP   Deconditioning -PT, OT   Chronic HFrEF History of MVR, redo bioprosthetic AVR with paroxysmal Afib -holding PTA warfarin; con't DVT prophylaxis Collins heparin  -holding coreg  while needing pressors   AKI on CKD 3b, improving -strict I/O -maintain adequate perfusion -renally dose meds, avoid nephrotoxic meds -hold lasix  today with no TF or significant volume going in   Mild thrombocytopenia, recovering -monitor, no need for transfusion currently   Chronic anemia -monitor; transfuse for Hb <7 or hemodynamically significant bleeding  H/o GERD -con't PTA PPI     Best Practice (right click and "Reselect all SmartList Selections" daily)   Diet/type: NPO DVT prophylaxis SCD SQH  Pressure ulcer(s): N/A GI prophylaxis: PPI Lines: Central line- need for prolonged pressors Foley:  N/A Code Status:  DNR Last date of multidisciplinary goals of care discussion: 2/10 -- DNR, terminal extubation this morning. Confirmed plan  for success vs palliation post extubation with son & daughter this morning with palliative care and RN.     This patient is critically ill with multiple organ system failure which requires frequent high complexity decision making, assessment, support, evaluation, and titration of therapies. This was completed through the application of advanced monitoring technologies and extensive interpretation of multiple databases. During this encounter critical care time was devoted to patient care services described in this note for 36 minutes.  Joesph Mussel, DO 02/17/23 9:40 AM  Pulmonary & Critical Care  For contact information, see Amion. If no response to pager, please call PCCM consult pager. After hours, 7PM- 7AM, please call Elink.

## 2023-02-18 ENCOUNTER — Inpatient Hospital Stay (HOSPITAL_COMMUNITY): Payer: 59

## 2023-02-18 DIAGNOSIS — J9601 Acute respiratory failure with hypoxia: Secondary | ICD-10-CM | POA: Diagnosis not present

## 2023-02-18 DIAGNOSIS — G9340 Encephalopathy, unspecified: Secondary | ICD-10-CM | POA: Diagnosis not present

## 2023-02-18 DIAGNOSIS — E46 Unspecified protein-calorie malnutrition: Secondary | ICD-10-CM | POA: Diagnosis not present

## 2023-02-18 DIAGNOSIS — J210 Acute bronchiolitis due to respiratory syncytial virus: Secondary | ICD-10-CM | POA: Diagnosis not present

## 2023-02-18 DIAGNOSIS — I5022 Chronic systolic (congestive) heart failure: Secondary | ICD-10-CM | POA: Diagnosis not present

## 2023-02-18 DIAGNOSIS — Z515 Encounter for palliative care: Secondary | ICD-10-CM | POA: Diagnosis not present

## 2023-02-18 DIAGNOSIS — Z7189 Other specified counseling: Secondary | ICD-10-CM | POA: Diagnosis not present

## 2023-02-18 LAB — BASIC METABOLIC PANEL
Anion gap: 9 (ref 5–15)
BUN: 28 mg/dL — ABNORMAL HIGH (ref 8–23)
CO2: 32 mmol/L (ref 22–32)
Calcium: 9.4 mg/dL (ref 8.9–10.3)
Chloride: 95 mmol/L — ABNORMAL LOW (ref 98–111)
Creatinine, Ser: 1.28 mg/dL — ABNORMAL HIGH (ref 0.44–1.00)
GFR, Estimated: 44 mL/min — ABNORMAL LOW (ref >60)
Glucose, Bld: 102 mg/dL — ABNORMAL HIGH (ref 70–99)
Potassium: 5.1 mmol/L (ref 3.5–5.1)
Sodium: 136 mmol/L (ref 135–145)

## 2023-02-18 LAB — GLUCOSE, CAPILLARY
Glucose-Capillary: 75 mg/dL (ref 70–99)
Glucose-Capillary: 183 mg/dL — ABNORMAL HIGH (ref 70–99)
Glucose-Capillary: 49 mg/dL — ABNORMAL LOW (ref 70–99)
Glucose-Capillary: 112 mg/dL — ABNORMAL HIGH (ref 70–99)
Glucose-Capillary: 127 mg/dL — ABNORMAL HIGH (ref 70–99)
Glucose-Capillary: 108 mg/dL — ABNORMAL HIGH (ref 70–99)

## 2023-02-18 MED ORDER — DEXTROSE 50 % IV SOLN
INTRAVENOUS | Status: AC
  Administered 2023-02-18: 50 mL
  Filled 2023-02-18: qty 50

## 2023-02-18 NOTE — TOC Initial Note (Signed)
Transition of Care [facility]) - Initial/Assessment Note    Patient Details  Name: [name] MRN: [id number] Date of Birth: 1945/05/11  Transition of [facility]) CM/SW Contact:    Delilah Shan, LCSWA Phone Number: 02/18/2023, 3:16 PM  Clinical Narrative:                  CSW received consult for possible SNF placement at time of discharge.Due to patients current orientation CSW spoke with patients daughter [name] regarding PT recommendation of SNF placement at time of discharge. Patients daughter reports PTA patient comes from home with her. Patients daughter expressed understanding of PT recommendation and is agreeable to SNF placement for patient at time of discharge. Patients daughter gave CSW permission to fax out patient for SNF placement. CSW informed patients daughter that CSW will fax patient out for SNF placement closer to patient being medically ready for dc.CSW discussed insurance authorization process and will provided Medicare SNF ratings list with accepted SNF bed offers when available. No further questions reported at this time. CSW to continue to follow and assist with discharge planning needs.    Expected Discharge Plan: Skilled Nursing Facility Barriers to Discharge: Continued Medical Work up   Patient Goals and CMS Choice     Choice offered to / list presented to : Adult Children (daughter [name])      Expected Discharge Plan and Services In-house Referral: Clinical Social Work Discharge Planning Services: CM Consult   Living arrangements for the past 2 months: Apartment                                      Prior Living Arrangements/Services Living arrangements for the past 2 months: Apartment Lives with:: Adult Children (daughter) Patient language and need for interpreter reviewed:: Yes Do you feel safe going back to the place where you live?: Yes      Need for Family Participation in Patient Care: Yes (Comment) Care giver support system  in place?: Yes (comment) Current home services: DME (cane) Criminal Activity/Legal Involvement Pertinent to Current Situation/Hospitalization: No - Comment as needed  Activities of Daily Living   ADL Screening (condition at time of admission) Independently performs ADLs?: No Does the patient have a NEW difficulty with bathing/dressing/toileting/self-feeding that is expected to last >3 days?: Yes (Initiates electronic notice to provider for possible OT consult) Does the patient have a NEW difficulty with getting in/out of bed, walking, or climbing stairs that is expected to last >3 days?: Yes (Initiates electronic notice to provider for possible PT consult) Does the patient have a NEW difficulty with communication that is expected to last >3 days?: Yes (Initiates electronic notice to provider for possible SLP consult) Is the patient deaf or have difficulty hearing?: No Does the patient have difficulty seeing, even when wearing glasses/contacts?: No Does the patient have difficulty concentrating, remembering, or making decisions?: Yes  Permission Sought/Granted Permission sought to share information with : Case Manager, Magazine features editor, Family Supports Permission granted to share information with : Yes, Verbal Permission Granted  Share Information with NAME: [name]     Permission granted to share info w Relationship: daughter  Permission granted to share info w Contact Information: [phone]  Emotional Assessment   Attitude/Demeanor/Rapport: Intubated (Following Commands or Not Following Commands)   Orientation: : Oriented to Self Alcohol / Substance Use: Not Applicable Psych Involvement: No (comment)  Admission diagnosis:  Subdural  hematoma (HCC) [S06.5XAA] Encephalopathy acute [G93.40] Warfarin toxicity, accidental or unintentional, initial encounter [T45.511A] Altered mental status, unspecified altered mental status type [R41.82] Patient Active Problem  List   Diagnosis Date Noted   Vascular dementia (HCC) 02/15/2023   DNR (do not resuscitate) 02/15/2023   Goals of care, counseling/discussion 02/15/2023   On mechanically assisted ventilation (HCC) 02/15/2023   RSV (acute bronchiolitis due to respiratory syncytial virus) 02/15/2023   RSV infection 02/13/2023   Warfarin toxicity, accidental or unintentional, initial encounter 02/11/2023   RSV (respiratory syncytial virus pneumonia) 02/10/2023   Altered mental status 02/10/2023   Subdural hematoma (HCC) 02/09/2023   Hypotension 02/09/2023   Acute respiratory failure (HCC) 02/09/2023   Encephalopathy acute 02/07/2023   Submandibular lymphadenopathy 01/20/2023   Encounter for general adult medical examination with abnormal findings 10/27/2020   Neck pain 07/05/2017   Seasonal allergic rhinitis 10/04/2016   Chronic gout 08/03/2016   Vascular dementia, uncomplicated (HCC) 05/17/2016   Chest congestion 05/06/2016   S/P mitral valve replacement with bioprosthetic valve 04/25/2015   Essential hypertension 04/25/2015   Chronic anticoagulation 04/25/2015   Intracranial vascular stenosis    Hyperlipidemia    Chronic systolic CHF (congestive heart failure) (HCC) 01/04/2015   CKD (chronic kidney disease) stage 3, GFR 30-59 ml/min (HCC) 01/04/2015   Atrial fibrillation (HCC) 09/22/2014   RBBB 03/11/2014   PCP:  Myrlene Broker, MD Pharmacy:   [facility] [address] [place] [address] Phone: [phone] Fax: [phone]     Social Drivers of Health (SDOH) Social History: SDOH Screenings   Food Insecurity: No Food Insecurity (02/09/2023)  Housing: Low Risk  (02/09/2023)  Transportation Needs: No Transportation Needs (02/09/2023)  Utilities: Not At Risk (02/09/2023)  Alcohol Screen: Low Risk  (09/02/2022)  Depression (PHQ2-9): Low Risk  (01/20/2023)  Financial Resource Strain: Low Risk  (09/02/2022)  Physical Activity: Insufficiently Active  (09/02/2022)  Social Connections: Socially Isolated (02/09/2023)  Stress: No Stress Concern Present (09/02/2022)  Tobacco Use: Medium Risk (02/07/2023)  Health Literacy: Adequate Health Literacy (09/02/2022)   SDOH Interventions:     Readmission Risk Interventions     No data to display

## 2023-02-18 NOTE — Evaluation (Addendum)
Modified Barium Swallow Study  Patient Details  Name: Laramie Gelles MRN: [id number] Date of Birth: August 07, 1945  Today's Date: 02/18/2023  Modified Barium Swallow completed.  Full report located under Chart Review in the Imaging Section.  History of Present Illness Pt is a 78 yo female who was found down by daughter and brought to ED 1/31 where she was noted to have pinpoint pupils, L hemiparesis, and right gaze deviation. She was intubated for airway protection. 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 MRI brain revealed no stroke. 2/10 - one way extubation (ETT ten days). PMH: dementia with memory loss but  functional soical communication with family; acute CHF, HLD, HTN,  Prosthetic valve dysfunction (07/22/2014), S/P MVR (mitral valve replacement) (04/11/1998), S/P redo mitral valve replacement with bioprosthetic valve (07/25/2014), Stroke, Thrombosis of prosthetic heart valve (07/25/2014), and Valvular endocarditis (2000)   Clinical Impression Despite impaired mentation and inability to follow commands, Ms. Purnell Shoemaker presented with functional swallowing with reliable airway protection and adequate efficiency/transfer of POs through oral and pharyngeal cavities (no residue/pooling). Postural control was poor with neck held in flexion and occasional physical assist needed to elevate head for improved visibility. No regular solids were given due to impaired mentation. She demonstrated adequate oral control of purees; thin liquids tended to spill out of mouth, but she was able to propel most of bolus into her pharynx.  She was unable to draw liquid from a straw, so cup sips were offered.  There was reliable laryngeal vestibule closure with no aspiration, occasional transient penetration (considered WFL -PAS score of 2).  There was adequate pharyngeal squeeze and clearance.    The primary barrier is going to be  the pt's ability to eat and drink enough to meet her needs. For now, recommend initiating a dysphagia 1 diet with thin liquids.  Will need careful hand-feeding; crush meds and give in puree. D/W RN, who was present in radiology for the study. SLP will follow.   Factors that may increase risk of adverse event in presence of aspiration Rubye Oaks [employer] Clearance Coots 2021): Reduced cognitive function;Limited mobility;Frail or deconditioned;Dependence for feeding and/or oral hygiene  Swallow Evaluation Recommendations Recommendations: PO diet PO Diet Recommendation: Thin liquids (Level 0);Mildly thick liquids (Level 2, nectar thick) Liquid Administration via: Cup;Straw Medication Administration: Crushed with puree Supervision: Full assist for feeding Postural changes: Position pt fully upright for meals Oral care recommendations: Oral care BID (2x/day)    Diamante Truszkowski L. [name], MA CCC/SLP Clinical Specialist - Acute Care SLP Acute Rehabilitation Services Office number [phone]   [name] [name] 02/18/2023,2:34 PM

## 2023-02-18 NOTE — Evaluation (Signed)
Clinical/Bedside Swallow Evaluation Patient Details  Name: [name] MRN: [id number] Date of Birth: 1945/11/14  Today's Date: 02/18/2023 Time: SLP Start Time (ACUTE ONLY): 5284 SLP Stop Time (ACUTE ONLY): 0946 SLP Time Calculation (min) (ACUTE ONLY): 22 min  Past Medical History:  Past Medical History:  Diagnosis Date   Acute CHF (congestive heart failure) (HCC) 12/14/2014   Hyperlipidemia    Hypertension    Prosthetic valve dysfunction 07/22/2014   Acute mitral stenosis due to restricted leaflet mobility of bileaflet mechanical mitral valve prosthesis   S/P MVR (mitral valve replacement) 04/11/1998   #29 St. Jude bileaflet mechanical valve for bacterial endocarditis   S/P redo mitral valve replacement with bioprosthetic valve 07/25/2014   27 mm [facility] Mitral bovine bioprosthetic tissue valve   Stroke (HCC)    Thrombosis of prosthetic heart valve 07/25/2014   Valvular endocarditis 2000   Streptococcus   Past Surgical History:  Past Surgical History:  Procedure Laterality Date   ABDOMINAL HYSTERECTOMY     CARDIAC CATHETERIZATION N/A 07/24/2014   Procedure: Right/Left Heart Cath and Coronary Angiography;  Surgeon: Iran Ouch, MD;  Location: [facility] INVASIVE CV LAB;  Service: Cardiovascular;  Laterality: N/A;   CARDIAC CATHETERIZATION N/A 07/22/2014   Procedure: Fluoroscopy Guidance;  Surgeon: Lennette Bihari, MD;  Location: [facility] INVASIVE CV LAB;  Service: Cardiovascular;  Laterality: N/A;   CARDIAC CATHETERIZATION N/A 12/16/2014   Procedure: Right Heart Cath;  Surgeon: Laurey Morale, MD;  Location: [facility] INVASIVE CV LAB;  Service: Cardiovascular;  Laterality: N/A;   CARDIAC VALVE REPLACEMENT     MITRAL VALVE REPLACEMENT  04/11/1998   St. Jude mechanical MVR (severe MR, episode of streptococcal bacterial endocarditis - Dr. Cornelius Moras   MITRAL VALVE REPLACEMENT N/A 07/25/2014   Procedure: REDO MITRAL VALVE REPLACEMENT (MVR);  Surgeon: Purcell Nails, MD;  [facility];  Service:  Open Heart Surgery;  Laterality: N/A;   TOTAL ABDOMINAL HYSTERECTOMY W/ BILATERAL SALPINGOOPHORECTOMY  12/14/1998   TRANSTHORACIC ECHOCARDIOGRAM  11/2009   EF=>55%; bi-leaflet St. Jude mech prosthesis; mild TR, RVSP elevated at 40-22mmHg, mod pulm HTN; trace AV regurg; mild pulm valve regurg   TUBAL LIGATION     HPI:  Pt is a 78 yo female who was found down by daughter and brought to ED 1/31 where she was noted to have pinpoint pupils, L hemiparesis, and right gaze deviation. She was intubated for airway protection. 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 MRI brain revealed no stroke. 2/10 - one way extubation (ETT ten days). PMH: dementia with memory loss but  functional soical communication with family; acute CHF, HLD, HTN,  Prosthetic valve dysfunction (07/22/2014), S/P MVR (mitral valve replacement) (04/11/1998), S/P redo mitral valve replacement with bioprosthetic valve (07/25/2014), Stroke, Thrombosis of prosthetic heart valve (07/25/2014), and Valvular endocarditis (2000)    Assessment / Plan / Recommendation  Clinical Impression  Pt presents with s/s of an oropharyngeal dysphagia, exacerbated by mental status changes. Her son, [name], was at the bedside. She followed very few commands. Unable to assess oral cavity due to resistance to opening mouth for exam.  She accepted ice chips, 1/2 teaspoons applesauce, small sips of water with improving attention as session progressed. There was intermittent coughing associated with PO intake. Spontaneous voicing was low volume and poorly intelligible.  Pt will need an MBS to determine potential for PO intake. There is a likelihood she may need supplemental enteral  feeding pending recovery of mental status. MBS is scheduled for 1:00 pm today. D/W son and RN. SLP Visit Diagnosis: Dysphagia, unspecified (R13.10)    Aspiration Risk    tba   Diet Recommendation   NPO- MBS              Recommendations for follow up therapy are one component of a multi-disciplinary discharge planning process, led by the attending physician.  Recommendations may be updated based on patient status, additional functional criteria and insurance authorization.  Follow up Recommendations   tba       Swallow Study   General Date of Onset: 02/07/23 HPI: Pt is a 78 yo female who was found down by daughter and brought to ED 1/31 where she was noted to have pinpoint pupils, L hemiparesis, and right gaze deviation. She was intubated for airway protection. 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 MRI brain revealed no stroke. 2/10 - one way extubation (ETT ten days). PMH: dementia with memory loss but  functional soical communication with family; acute CHF, HLD, HTN,  Prosthetic valve dysfunction (07/22/2014), S/P MVR (mitral valve replacement) (04/11/1998), S/P redo mitral valve replacement with bioprosthetic valve (07/25/2014), Stroke, Thrombosis of prosthetic heart valve (07/25/2014), and Valvular endocarditis (2000) Type of Study: Bedside Swallow Evaluation Previous Swallow Assessment: no Diet Prior to this Study: NPO Temperature Spikes Noted: No Respiratory Status: Nasal cannula History of Recent Intubation: Yes Total duration of intubation (days): 10 days Date extubated: 02/17/23 Behavior/Cognition: Lethargic/Drowsy Oral Cavity Assessment: Other (comment) (unable to visualize) Oral Care Completed by SLP: Recent completion by staff Self-Feeding Abilities: Total assist Patient Positioning: Upright in bed Baseline Vocal Quality: Low vocal intensity Volitional Cough: Cognitively unable to elicit Volitional Swallow: Unable to elicit    Oral/Motor/Sensory Function Overall Oral Motor/Sensory Function: Other (comment) (not f/c, symmetric at baseline)   Ice Chips Ice chips: Within functional  limits Presentation: Spoon   Thin Liquid Thin Liquid: Impaired Presentation: Spoon;Cup Oral Phase Functional Implications: Right anterior spillage;Left anterior spillage Pharyngeal  Phase Impairments: Cough - Immediate    Nectar Thick Nectar Thick Liquid: Not tested   Honey Thick Honey Thick Liquid: Not tested   Puree Puree: Impaired Presentation: Spoon Oral Phase Functional Implications: Prolonged oral transit   Solid     Solid: Not tested      [name] [name] 02/18/2023,9:56 AM  Marchelle Folks L. [name], MA CCC/SLP Clinical Specialist - Acute Care SLP Acute Rehabilitation Services Office number [phone]

## 2023-02-18 NOTE — Progress Notes (Signed)
NAME:  [name], MRN:  [id number], DOB:  [birth year], LOS: 11 ADMISSION DATE:  02/07/2023, CONSULTATION DATE:  02/07/2023 REFERRING MD: Rubin Payor - EDP, CHIEF COMPLAINT:  AMS, code stroke   History of Present Illness:  Patient was brought into the hospital following being found unresponsive by her daughter, last known well 730 this morning She was found when daughter came back from work, difficulty breathing, EMS activated.  Was bradycardic, hypotensive on initial evaluation, pinpoint pupils, facial droop.  No response to Narcan Intubated in the ED for airway protection  Daughter is unclear whether she may have had a seizure  Had some chest congestion over the last week Was her usual self this morning when she was last seen  Pertinent  Medical History   Past Medical History:  Diagnosis Date   Acute CHF (congestive heart failure) (HCC) 12/14/2014   Hyperlipidemia    Hypertension    Prosthetic valve dysfunction 07/22/2014   Acute mitral stenosis due to restricted leaflet mobility of bileaflet mechanical mitral valve prosthesis   S/P MVR (mitral valve replacement) 04/11/1998   #29 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  No mass effect, no midline shift.  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  Required intubation for airway protection.  Seen by neurosurgery felt not a candidate for surgery with small acute on chronic subdurals recommended against reversal of warfarin given concern about  basilar artery occlusion.  Seen by neurology, EEG ordered, started on Keppra.  Hypotensive, started on norepinephrine 2/1 EEG suggesting moderate to severe diffuse encephalopathy but no seizure or epileptiform discharges.  Repeat CT head ordered, and showing no new changes 2/2 eeg w areas of increased epileptogenicity + slowing. Off cont sedation. ECHO  w MV gradient change and decr EF 2/3  + RSV; EEG in place which is showing high risk of epileptogenicity but no active seizures; neuro added valproic acid 2/5 plan to cont through the weekend and re-eval GOC . INR 3.6 not on AC 2/6 recheck INR  2/8 more awake. Met with family. DNR. Discussing possible 1-way extubation 2/10 One-way extubation completed. Tolerated well.  Interim History / Subjective:  No significant events overnight Extubated 2/10, tolerated well overall Remains on 3LNC, managing secretions at present Waxing/waning mental status Plan for barium swallow study today, 2/11  Objective   Blood pressure 122/85, pulse 70, temperature (!) 97.5 F (36.4 C), temperature source Oral, resp. rate 15, height 5\' 4"  (1.626 m), weight 58.3 kg, SpO2 (!) 87%. CVP:  [18 mmHg] 18 mmHg      Intake/Output Summary (Last 24 hours) at 02/18/2023 1129 Last data filed at 02/18/2023 0500 Gross per 24 hour  Intake 346.96 ml  Output 150 ml  Net 196.96 ml   Filed Weights   02/16/23 0500 02/17/23 0703 02/18/23 0400  Weight: 58.6 kg 61.8 kg 58.3 kg   Physical Examination: General: Chronically ill-appearing older woman in NAD. HEENT: [place]/AT, anicteric sclera, PERRL, moist mucous membranes. Neuro:  Awake, alert - appears oriented to self, unable to assess further orientation.  Responds  to verbal stimuli. Following commands intermittently. Generalized weakness. Weak cough.  CV: RRR, few PVCs, no m/g/r. PULM: Breathing even and minimally labored on 3LNC. Lung fields diminished throughout. Wet, nonproductive cough. GI: Soft, nontender, nondistended.  Normoactive bowel sounds. Extremities: No significant LE edema noted. Skin: Warm/dry, no rashes.  Resolved Hospital Problem List:    Lactic acidosis Recurrent hypoglycemia Possible aspiration pneumonia  s/p 5-day Rocephin   Assessment & Plan:   Acute respiratory failure with hypoxia due to RSV infection Wheezing due to RSV   - Supplemental O2 support for O2 sat > 90% - Continue Solumedrol - Continue triple therapy (Brovana/Yupelri/Pulmicort) - DuoNebs PRN - Pulmonary hygiene - Extubated with children at bedside 2/10; no plan for reintubation; we are hopeful that she will be successful but in the event she worsens clinically or appears uncomfortable, family is on board with transitioning to comfort-focused care - Appreciate PMT assistance  Acute encephalopathy with baseline vascular dementia New-onset seizures SDH - AEDs (Keppra, valproate) - Continue modafinil, Aricept - Family at bedside for reorientation - PT/OT/SLP as below   Hypotension - Continue midodrine as able - Goal MAP > 65 - Off of vasopressors  Chronic HFrEF History of MVR, redo bioprosthetic AVR with paroxysmal Afib - Hold home warfarin - SQH for DVT ppx   AKI on CKD stage 3b, improving - Trend BMP - Replete electrolytes as indicated - Monitor I&Os, assess diuresis needs daily - Avoid nephrotoxic agents as able - Ensure adequate renal perfusion   Mild thrombocytopenia, improving Chronic anemia - Trend H&H, Plt - Monitor for signs of active bleeding - Transfuse for Hgb < 7.0, Plt < 20K or hemodynamically significant bleeding  H/o GERD - PPI  Hypoglycemia - Likely 2/2 TF stopping - F/u barium swallow today, if unable to tolerate may need D5-containing fluids  Moderate protein energy malnutrition - Holding TF - Plan for barium swallow study today, 2/11 - Cortrak 2/12 if swallow study goes poorly   Deconditioning - PT/OT/SLP as able  Best Practice: (right click and "Reselect all SmartList  Selections" daily)   Diet/type: NPO DVT prophylaxis SCD SQH  Pressure ulcer(s): N/A GI prophylaxis: PPI Lines: Central line- need for prolonged pressors Foley:  N/A Code Status:  DNR Last date of multidisciplinary goals of care discussion: 2/10 -- DNR, terminal extubation this morning. Confirmed plan for success vs palliation post extubation with son & daughter this morning with palliative care and RN.   Critical care time: N/A   Faythe Ghee [facility] 02/18/23 11:29 AM  Please see [email or web address] for pager details.  From 7A-7P if no response, please call [phone] After hours, please call ELink [phone]

## 2023-02-18 NOTE — Evaluation (Signed)
Physical Therapy Evaluation Patient Details Name: [name] MRN: [id number] DOB: 1945/06/10 Today's Date: 02/18/2023  History of Present Illness  Pt is a 77yo female who was found down by daughter and brought to ED where she was noted to have pinpoint pupils, L hemiparesis, and right gaze deviation. She was intubated for airway protection. 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 MRI brain revealed no stroke. 2/10 - one way extubation PMH: Acute CHF, HLD, HTN,  Prosthetic valve dysfunction (07/22/2014), S/P MVR (mitral valve replacement) (04/11/1998), S/P redo mitral valve replacement with bioprosthetic valve (07/25/2014), Stroke, Thrombosis of prosthetic heart valve (07/25/2014), and Valvular endocarditis (2000)   Clinical Impression  Pt admitted with above and underwent 1 way extubation yesterday 2/10. Per RN and chart pt used RW at home, lived with daughter, but was home alone while daughter worked. Pt with h/o of prior stroke and vascular dementia resulting in cognitive impairment at baseline however unsure of degree. Pt currently presenting with simple command follow <25%, resistance to movement of any extremity, minimally verbal, some purposeful movement with UEs but not LEs, and noted pill rolling type behavior with bilat hands/fingers. Pt currently requiring mod/maxA for transfers and is unable to amb at this time. Recommend inpatient rehab program <3 hours a day to allow for increased time to achieve a level of function that is appropriate for return home with daughter. Acute PT to cont to follow.        If plan is discharge home, recommend the following: A lot of help with walking and/or transfers;A lot of help with bathing/dressing/bathroom;Help with stairs or ramp for entrance;Supervision due to cognitive status;Assistance with feeding;Assist for transportation   Can travel by private vehicle    No    Equipment Recommendations None recommended by PT (defer to next venue of care)  Recommendations for Other Services       Functional Status Assessment Patient has had a recent decline in their functional status and demonstrates the ability to make significant improvements in function in a reasonable and predictable amount of time.     Precautions / Restrictions Precautions Precautions: Fall Restrictions Weight Bearing Restrictions Per Provider Order: No      Mobility  Bed Mobility Overal bed mobility: Needs Assistance Bed Mobility: Supine to Sit, Sit to Supine     Supine to sit: Mod assist Sit to supine: Max assist   General bed mobility comments: pt initiated trunk elevation to achieve long sit, pt required modA to scoot to EOB, initially maxA to maintain EOB balance but then progressed to close contact guard    Transfers Overall transfer level: Needs assistance Equipment used: 2 person hand held assist (face to face transfer with bed pad) Transfers: Sit to/from Stand Sit to Stand: Max assist, +2 physical assistance           General transfer comment: face to face transfer with use of bed pads, max directional cues, pt did initiate, maxA to achieve full standing, attempted side steps however unable, completed 2 STS to scoot up to [facility]    Ambulation/Gait               General Gait Details: unable this date  Stairs            Wheelchair Mobility     Tilt Bed    Modified Rankin (Stroke Patients Only)       Balance Overall balance assessment: Needs  assistance Sitting-balance support: Feet supported, No upper extremity supported Sitting balance-Leahy Scale: Fair Sitting balance - Comments: fair statically, poor dynamically Postural control: Posterior lean Standing balance support: Bilateral upper extremity supported, During functional activity Standing balance-Leahy Scale: Zero Standing balance comment: dependent on external support                              Pertinent Vitals/Pain Pain Assessment Pain Assessment: Faces Faces Pain Scale: No hurt    Home Living Family/patient expects to be discharged to:: Private residence Living Arrangements: Children                 Additional Comments: pt poor historian. Per chart pt lives her daughter and granddaughter. Suspect pt is home alone while daughter works as daughter fond her on the floor unresponsive when  she returned home from work. unsure of home set up as pt could not answer questions and no family available    Prior Function Prior Level of Function : Patient poor historian/Family not available             Mobility Comments: per chart pt was ambulatory but unsure if she used a RW or not ADLs Comments: unsure if patient required assist for ADLs     Extremity/Trunk Assessment   Upper Extremity Assessment Upper Extremity Assessment: Defer to OT evaluation    Lower Extremity Assessment Lower Extremity Assessment: RLE deficits/detail;LLE deficits/detail RLE Deficits / Details: pt with minimal active movement to command. passively pt resistant to ROM however can achieve hip/knee flexion with multimodal cues and physical assist LLE Deficits / Details: pt with no initiation of movement however tolerates passive ROM but can be resistant.    Cervical / Trunk Assessment Cervical / Trunk Assessment: Kyphotic (L lateral bending)  Communication   Communication Communication: Other (comment) (soft spoken)    Cognition Arousal: Lethargic (will open eyes to name, doesn't keep them open) Behavior During Therapy: Flat affect   PT - Cognitive impairments: History of cognitive impairments (pt with vascular dementia)                       PT - Cognition Comments: pt with simple command following <25% of time. Pt did demo occasional puposeful movement with UEs ie reaching to scratch face. Pt with noted delayed processing however can be  impulsive as well Following commands: Impaired Following commands impaired: Follows one step commands inconsistently     Cueing Cueing Techniques: Verbal cues, Tactile cues, Visual cues     General Comments General comments (skin integrity, edema, etc.): VSS, SpO2 >90 on 3Lo2    Exercises     Assessment/Plan    PT Assessment Patient needs continued PT services  PT Problem List Decreased strength;Decreased range of motion;Decreased activity tolerance;Decreased balance;Decreased mobility       PT Treatment Interventions DME instruction;Gait training;Stair training;Functional mobility training;Therapeutic activities;Therapeutic exercise;Balance training    PT Goals (Current goals can be found in the Care Plan section)  Acute Rehab PT Goals Patient Stated Goal: didn't state PT Goal Formulation: Patient unable to participate in goal setting Time For Goal Achievement: 03/04/23 Potential to Achieve Goals: Good    Frequency Min 1X/week     Co-evaluation PT/OT/SLP Co-Evaluation/Treatment:  (+2 for OOB mobility)             AM-PAC PT "6 Clicks" Mobility  Outcome Measure Help needed turning from your back to your side while in  a flat bed without using bedrails?: A Lot Help needed moving from lying on your back to sitting on the side of a flat bed without using bedrails?: A Lot Help needed moving to and from a bed to a chair (including a wheelchair)?: A Lot Help needed standing up from a chair using your arms (e.g., wheelchair or bedside chair)?: Total Help needed to walk in hospital room?: Total Help needed climbing 3-5 steps with a railing? : Total 6 Click Score: 9    End of Session Equipment Utilized During Treatment: Oxygen Activity Tolerance: Patient tolerated treatment well Patient left: in bed;with call bell/phone within reach;with bed alarm set Nurse Communication: Mobility status PT Visit Diagnosis: Unsteadiness on feet (R26.81);Muscle weakness (generalized)  (M62.81)    Time: 1610-9604 PT Time Calculation (min) (ACUTE ONLY): 21 min   Charges:   PT Evaluation $PT Eval Moderate Complexity: 1 Mod   PT General Charges $$ ACUTE PT VISIT: 1 Visit         Lewis Shock, PT, DPT Acute Rehabilitation Services Secure chat preferred Office #: [phone]   Iona Hansen 02/18/2023, 9:23 AM

## 2023-02-18 NOTE — Evaluation (Signed)
Occupational Therapy Evaluation Patient Details Name: [name] MRN: [id number] DOB: 1945/12/01 Today's Date: 02/18/2023   History of Present Illness   History of Present Illness: Pt is a 78yo female who was found down by daughter and brought to ED where she was noted to have pinpoint pupils, L hemiparesis, and right gaze deviation. She was intubated for airway protection. 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 MRI brain revealed no stroke. 2/10 - one way extubation PMH: Acute CHF, HLD, HTN,  Prosthetic valve dysfunction (07/22/2014), S/P MVR (mitral valve replacement) (04/11/1998), S/P redo mitral valve replacement with bioprosthetic valve (07/25/2014), Stroke, Thrombosis of prosthetic heart valve (07/25/2014), and Valvular endocarditis (2000)     Clinical Impressions Per chart and staff, pt lives with family and ambulatory with RW at baseline. Unsure of PLOF for ADLs as family not present and pt unable to report. Pt presents now with deficits in cognition, strength, balance and endurance. Pt requires Mod-Max A for bed mobility, Min A to Supervision needed for EOB balance. Pt with inconsistent command following and difficulty sequencing/initiating ADLs requiring overall Total A. Unsure of pt's baseline cognition at time of OT eval. Based on current assistance needed, recommend continued inpatient follow up therapy, <3 hours/day unless family confirm ability to provide the currently needed assistance.      If plan is discharge home, recommend the following:   A lot of help with walking and/or transfers;Two people to help with walking and/or transfers;A lot of help with bathing/dressing/bathroom;Two people to help with bathing/dressing/bathroom     Functional Status Assessment   Patient has had a recent decline in their functional status and demonstrates the ability to make significant improvements  in function in a reasonable and predictable amount of time.     Equipment Recommendations   Other (comment) (TBD)     Recommendations for Other Services         Precautions/Restrictions   Precautions Precautions: Fall Restrictions Weight Bearing Restrictions Per Provider Order: No     Mobility Bed Mobility Overal bed mobility: Needs Assistance Bed Mobility: Supine to Sit, Sit to Supine     Supine to sit: Mod assist Sit to supine: Max assist   General bed mobility comments: pt initiated trunk elevation to achieve long sit, pt required modA to scoot to EOB, initially maxA to maintain EOB balance but then progressed to close contact guard    Transfers Overall transfer level: Needs assistance Equipment used: 2 person hand held assist (face to face transfer with bed pad) Transfers: Sit to/from Stand Sit to Stand: Max assist, +2 physical assistance           General transfer comment: face to face transfer with use of bed pads, max directional cues, pt did initiate, maxA to achieve full standing, attempted side steps however unable, completed 2 STS to scoot up to [facility]      Balance Overall balance assessment: Needs assistance Sitting-balance support: Feet supported, No upper extremity supported Sitting balance-Leahy Scale: Fair Sitting balance - Comments: fair statically, poor dynamically Postural control: Posterior lean Standing balance support: Bilateral upper extremity supported, During functional activity Standing balance-Leahy Scale: Zero Standing balance comment: dependent on external support                           ADL either performed or assessed with clinical judgement   ADL Overall ADL's : Needs  assistance/impaired Eating/Feeding: NPO   Grooming: Sitting;Total assistance;Wash/dry face Grooming Details (indicate cue type and reason): pt with inconsistent command following. indicated being L handed so placed washcloth in this hand, hand  over hand to bring to face w/ pt automatically closing eyes but did not initiate to wash face. removed washcloth from hand with pt then reaching to mouth to wipe face so provided cloth again and assisted. Attempted lotion to B hands with variable cue types though minimal engagment noted Upper Body Bathing: Total assistance   Lower Body Bathing: Total assistance;Bed level   Upper Body Dressing : Total assistance   Lower Body Dressing: Total assistance;Bed level       Toileting- Clothing Manipulation and Hygiene: Total assistance;Bed level         General ADL Comments: Overall Total A due to impaired cognition     Vision Ability to See in Adequate Light: 0 Adequate Patient Visual Report: No change from baseline (appeared [facility]) Vision Assessment?: No apparent visual deficits     Perception         Praxis         Pertinent Vitals/Pain Pain Assessment Pain Assessment: Faces Faces Pain Scale: No hurt     Extremity/Trunk Assessment Upper Extremity Assessment Upper Extremity Assessment: Generalized weakness;Left hand dominant;RUE deficits/detail RUE Deficits / Details: pt with questionable tone in elbow vs resistance to flexion. did indicate some increased ability to extend elbow with EOB   Lower Extremity Assessment Lower Extremity Assessment: Defer to PT evaluation RLE Deficits / Details: pt with minimal active movement to command. passively pt resistant to ROM however can achieve hip/knee flexion with multimodal cues and physical assist LLE Deficits / Details: pt with no initiation of movement however tolerates passive ROM but can be resistant.   Cervical / Trunk Assessment Cervical / Trunk Assessment: Kyphotic (L lateral bending)   Communication Communication Communication: Other (comment) (soft spoken)   Cognition Arousal: Lethargic, Alert Behavior During Therapy: Flat affect Cognition: History of cognitive impairments             OT - Cognition Comments: hx  of dementia- unsure of baseline as family not present. pt with inconsistent command following but will answer basic questions. does smile to humor and shows some intiation of tasks that pt is intrinsically motivated to complete                 Following commands: Impaired Following commands impaired: Follows one step commands inconsistently     Cueing  General Comments   Cueing Techniques: Verbal cues;Tactile cues;Visual cues      Exercises     Shoulder Instructions      Home Living Family/patient expects to be discharged to:: Private residence Living Arrangements: Children                               Additional Comments: pt poor historian. Per chart pt lives her daughter and granddaughter. Suspect pt is home alone while daughter works as daughter fond her on the floor unresponsive when  she returned home from work. unsure of home set up as pt could not answer questions and no family available      Prior Functioning/Environment Prior Level of Function : Patient poor historian/Family not available             Mobility Comments: per chart pt was ambulatory but unsure if she used a RW or not ADLs Comments: unsure if  patient required assist for ADLs    OT Problem List: Decreased strength;Impaired balance (sitting and/or standing);Decreased activity tolerance;Decreased cognition;Cardiopulmonary status limiting activity   OT Treatment/Interventions: Self-care/ADL training;Therapeutic exercise;Energy conservation;DME and/or AE instruction;Therapeutic activities;Patient/family education;Balance training      OT Goals(Current goals can be found in the care plan section)   Acute Rehab OT Goals Patient Stated Goal: none stated OT Goal Formulation: Patient unable to participate in goal setting Time For Goal Achievement: 03/04/23 Potential to Achieve Goals: Fair   OT Frequency:  Min 1X/week    Co-evaluation              AM-PAC OT "6 Clicks" Daily  Activity     Outcome Measure Help from another person eating meals?: Total Help from another person taking care of personal grooming?: Total Help from another person toileting, which includes using toliet, bedpan, or urinal?: Total Help from another person bathing (including washing, rinsing, drying)?: Total Help from another person to put on and taking off regular upper body clothing?: Total Help from another person to put on and taking off regular lower body clothing?: Total 6 Click Score: 6   End of Session Equipment Utilized During Treatment: Oxygen Nurse Communication: Mobility status  Activity Tolerance: Patient tolerated treatment well Patient left: in bed;with call bell/phone within reach  OT Visit Diagnosis: Other symptoms and signs involving cognitive function;Muscle weakness (generalized) (M62.81);Other abnormalities of gait and mobility (R26.89)                Time: 1610-9604 OT Time Calculation (min): 23 min Charges:  OT General Charges $OT Visit: 1 Visit OT Evaluation $OT Eval Moderate Complexity: 1 Mod  Bradd Canary, OTR/L Acute Rehab Services Office: [phone]   Lorre Munroe 02/18/2023, 11:16 AM

## 2023-02-19 DIAGNOSIS — F0150 Vascular dementia without behavioral disturbance: Secondary | ICD-10-CM | POA: Diagnosis not present

## 2023-02-19 DIAGNOSIS — E46 Unspecified protein-calorie malnutrition: Secondary | ICD-10-CM | POA: Diagnosis not present

## 2023-02-19 DIAGNOSIS — G9340 Encephalopathy, unspecified: Secondary | ICD-10-CM

## 2023-02-19 DIAGNOSIS — Z7189 Other specified counseling: Secondary | ICD-10-CM | POA: Diagnosis not present

## 2023-02-19 DIAGNOSIS — J9601 Acute respiratory failure with hypoxia: Secondary | ICD-10-CM | POA: Diagnosis not present

## 2023-02-19 DIAGNOSIS — I5022 Chronic systolic (congestive) heart failure: Secondary | ICD-10-CM | POA: Diagnosis not present

## 2023-02-19 DIAGNOSIS — Z515 Encounter for palliative care: Secondary | ICD-10-CM | POA: Diagnosis not present

## 2023-02-19 DIAGNOSIS — N179 Acute kidney failure, unspecified: Secondary | ICD-10-CM

## 2023-02-19 DIAGNOSIS — J210 Acute bronchiolitis due to respiratory syncytial virus: Secondary | ICD-10-CM | POA: Diagnosis not present

## 2023-02-19 LAB — CBC
HCT: 25.9 % — ABNORMAL LOW (ref 36.0–46.0)
Hemoglobin: 7.9 g/dL — ABNORMAL LOW (ref 12.0–15.0)
MCH: 23.6 pg — ABNORMAL LOW (ref 26.0–34.0)
MCHC: 30.5 g/dL (ref 30.0–36.0)
MCV: 77.3 fL — ABNORMAL LOW (ref 80.0–100.0)
Platelets: 193 10*3/uL (ref 150–400)
RBC: 3.35 MIL/uL — ABNORMAL LOW (ref 3.87–5.11)
RDW: 21.2 % — ABNORMAL HIGH (ref 11.5–15.5)
WBC: 5.6 10*3/uL (ref 4.0–10.5)
nRBC: 0.4 % — ABNORMAL HIGH (ref 0.0–0.2)

## 2023-02-19 LAB — GLUCOSE, CAPILLARY
Glucose-Capillary: 91 mg/dL (ref 70–99)
Glucose-Capillary: 75 mg/dL (ref 70–99)

## 2023-02-19 LAB — BASIC METABOLIC PANEL
Anion gap: 11 (ref 5–15)
BUN: 34 mg/dL — ABNORMAL HIGH (ref 8–23)
CO2: 32 mmol/L (ref 22–32)
Calcium: 9.3 mg/dL (ref 8.9–10.3)
Chloride: 92 mmol/L — ABNORMAL LOW (ref 98–111)
Creatinine, Ser: 1.51 mg/dL — ABNORMAL HIGH (ref 0.44–1.00)
GFR, Estimated: 36 mL/min — ABNORMAL LOW (ref >60)
Glucose, Bld: 107 mg/dL — ABNORMAL HIGH (ref 70–99)
Potassium: 5.1 mmol/L (ref 3.5–5.1)
Sodium: 135 mmol/L (ref 135–145)

## 2023-02-19 MED ORDER — METHYLPREDNISOLONE SODIUM SUCC 40 MG IJ SOLR
40.0000 mg | Freq: Every day | INTRAMUSCULAR | Status: DC
  Administered 2023-02-20 – 2023-02-21 (×2): 40 mg via INTRAVENOUS
  Filled 2023-02-19 (×2): qty 1

## 2023-02-19 MED ORDER — LACTATED RINGERS IV BOLUS
500.0000 mL | Freq: Once | INTRAVENOUS | Status: AC
  Administered 2023-02-19: 500 mL via INTRAVENOUS

## 2023-02-19 MED ORDER — SODIUM CHLORIDE 3 % IN NEBU: 4.0000 mL | INHALATION_SOLUTION | Freq: Two times a day (BID) | RESPIRATORY_TRACT | Status: AC | PRN

## 2023-02-19 NOTE — Progress Notes (Signed)
Palliative:  HPI: 78 y.o. female  with past medical history of mitral valve replacement, a-fib on warfarin, endocarditis, hypertension, and vascular dementia  admitted on 02/07/2023 after being found unresponsive by her daughter. On arrival to the ED, she was noted to have pinpoint pupils, left hemiparesis and right gaze deviation. She was intubated for airway protection. 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 MRI brain revealed no stroke. Patient was given Ativan and loaded with Keppra for potential seizure. Neurosurgery felt that the small size of the subdural hematoma did not warrant surgical intervention. 1/31 intubated for airway protection. 2/3 +RSV. PCCM with ongoing GOC conversations and plans for one way extubation 2/10.   I met today with Ms. Mita. Discussed with Dr. Chestine Spore, RN, and SLP. Concerns for lethargy, inability to clear secretions, and poor intake. Question of Cortrak. I called and discussed briefly with daughter, [name]. [name] is at work and cannot talk long. She asks me to speak with her brother when he comes to visit today and they will speak together as a family.   I was called to bedside by RN when family arrived. I met today with son, Cristal Deer. I reviewed with him his mother's progress, concerns from the medical team, and decision for Cortrak. I discussed with him the importance of family discussion to prepare that there may be more difficult decisions ahead. We reviewed concern for her dementia and how an acute illness of the severity can drastically change our baseline status of health. I expressed my concern that she is unable to clear her secretions and has required NTS multiple times. Cristal Deer was asking more what this means. We discussed concern for overall poor prognosis. We discussed the importance of family discussions to help [place] make decisions to ensure that what we  are asking her to go through to prolong her life is what she would desire given concern for change in quality of life and functional status change. Cristal Deer expresses understanding but this conversation is very overwhelming. He asks if I have discussed this with his sister and I explained that we were only able to speak briefly due to her being at work. I share that I will call her at the end of the day to fill her in on our conversation today.   I called [name] again and explained that I am sorry to interrupt her workday again. I explained my conversation with her brother and that he seemed very overwhelmed and it seemed important to him that I update her. I explained my conversation and concerns. [name] expresses understanding but this is difficult for her to hear that we are worried that her mother's prognosis is poor. I reassured her that we do not need to figure all this out today but I do believe it is important that family is aware of concerns so they can take time to process and discuss and help [place] make the best decisions for her mother. [name] expresses how difficult this is and how difficult is it to process how quickly and significantly her mother has declined. I acknowledge that this is a lot of information to process and this is not at all what family has expected. [name] expresses desire to speak further in person but will need to find a time she can be away from work. I reassure her that I am here tomorrow and available to speak and my colleague Dawn who met them over  the weekend will be here [place]. Reassured [name] that we can discuss further and continue to figure out where to go from here.   [name] is available by phone at all times but more available 1-2 pm if not urgent.   All questions/concerns addressed to the best of my ability. Emotional support provided during very difficult conversations today.   Exam: Awakens briefly but inconsistently. Eyes open but returns to sleep  with ease. Difficult to keep her awake. RN reports occasional verbal response and following commands but inconsistently. Does not follow commands for me. Fairly good cough but unable to expectorate. Abd soft. Warm to touch.   Plan: - Family will discuss wishes for Cortrak placement (anticipate they will want placement to give them more time to determine progress and decisions).  - Ongoing goals of care discussions and palliative support.   55 min  Yong Channel, NP Palliative Medicine Team Pager [phone] (Please see [email or web address] for schedule) Team Phone [phone]

## 2023-02-19 NOTE — Plan of Care (Signed)
  Problem: Clinical Measurements: Goal: Ability to maintain clinical measurements within normal limits will improve Outcome: Progressing Goal: Respiratory complications will improve Outcome: Progressing Goal: Cardiovascular complication will be avoided Outcome: Progressing   Problem: Elimination: Goal: Will not experience complications related to urinary retention Outcome: Progressing   Problem: Pain Managment: Goal: General experience of comfort will improve and/or be controlled Outcome: Progressing   Problem: Skin Integrity: Goal: Risk for impaired skin integrity will decrease Outcome: Progressing

## 2023-02-19 NOTE — Progress Notes (Signed)
NAME:  [name], MRN:  [id number], DOB:  [birth year], LOS: 12 ADMISSION DATE:  02/07/2023, CONSULTATION DATE:  02/07/2023 REFERRING MD: Rubin Payor - EDP, CHIEF COMPLAINT:  AMS, code stroke   History of Present Illness:  Patient was brought into the hospital following being found unresponsive by her daughter, last known well 730 this morning She was found when daughter came back from work, difficulty breathing, EMS activated.  Was bradycardic, hypotensive on initial evaluation, pinpoint pupils, facial droop.  No response to Narcan Intubated in the ED for airway protection  Daughter is unclear whether she may have had a seizure  Had some chest congestion over the last week Was her usual self this morning when she was last seen  Pertinent  Medical History   Past Medical History:  Diagnosis Date   Acute CHF (congestive heart failure) (HCC) 12/14/2014   Hyperlipidemia    Hypertension    Prosthetic valve dysfunction 07/22/2014   Acute mitral stenosis due to restricted leaflet mobility of bileaflet mechanical mitral valve prosthesis   S/P MVR (mitral valve replacement) 04/11/1998   #29 [employer] 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  No mass effect, no midline shift.  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  Required intubation for airway protection.  Seen by neurosurgery felt not a candidate for surgery with small acute on chronic subdurals recommended against reversal of warfarin given concern about  basilar artery occlusion.  Seen by neurology, EEG ordered, started on Keppra.  Hypotensive, started on norepinephrine 2/1 EEG suggesting moderate to severe diffuse encephalopathy but no seizure or epileptiform discharges.  Repeat CT head ordered, and showing no new changes 2/2 eeg w areas of increased epileptogenicity + slowing. Off cont sedation. ECHO  w MV gradient change and decr EF 2/3  + RSV; EEG in place which is showing high risk of epileptogenicity but no active seizures; neuro added valproic acid 2/5 plan to cont through the weekend and re-eval GOC . INR 3.6 not on AC 2/6 recheck INR  2/8 more awake. Met with family. DNR. Discussing possible 1-way extubation 2/10 One-way extubation completed. Tolerated well. 2/11: was signed off on dysphagia diet  2/12: mental status waxes and wanes. Family convo about cortrak possibly for ongoing med needs, supplemental TF  Interim History / Subjective:  NAEON. She continues to have waxing and waning mental status. She does not follow commands for me this morning. Mumbles incoherently. Nursing does not feel she is appropriate to swallow meds - I agree with this assessment. I have called daughter about ordering cortrak. She wants to speak with her brothers before making a decision on this. Otherwise can go out of ICU.   Objective   Blood pressure 121/62, pulse 90, temperature 98.4 F (36.9 C), temperature source Oral, resp. rate (!) 22, height 5\' 4"  (1.626 m), weight 59.8 kg, SpO2 100%.        Intake/Output Summary (Last 24 hours) at 02/19/2023 0818 Last data filed at 02/19/2023 0545 Gross per 24 hour  Intake 410.54 ml  Output 400 ml  Net 10.54 ml   Filed Weights   02/17/23 0703 02/18/23 0400 02/19/23 0500  Weight: 61.8 kg 58.3 kg 59.8 kg   Physical Examination: General: acute on chronically ill appearing, older female, laying in bed, no acute distress  HEENT: anicteric sclera, PERRLA, mucous membranes dry  Neuro: awake but appears drowsy.  Looks at provider to verbal command. Does not follow commands for moving feet or squeezing hands. Verbalizes incoherently. Globally very weak, weak cough  CV: s1s2, rrr, no murmur, rub, gallop  PULM: respirations even and unlabored. Productive cough, weak. On 2LNC. NTS PRN GI: rounded, soft, ntnd Extremities: No significant LE edema noted. Skin: Warm/dry  Resolved [facility] Problem List:   Lactic acidosis Recurrent hypoglycemia Possible aspiration pneumonia  s/p 5-day Rocephin   Assessment & Plan:  Acute respiratory failure with hypoxia due to RSV infection Wheezing due to RSV   - con't triple therapy with brovana, pulmicort, yupelri  - titrate solu-medrol down to 40mg  daily  - supplemental O2 for sat >90%  - duoneb PRN  - NTS as needed  - aggressive pulm hygiene  - one way extubation 2/10. Supplemental O2 support for O2 sat > 90% - palliative following   Acute encephalopathy with baseline vascular dementia New-onset seizures SDH - con't Keppra, valproate  - con't modafinil, Aricept - frequent reorientation and neuro checks - PT/OT/SLP as below   Hypotension - con't midodrine  - Goal MAP > 65 - Off of vasopressors  Chronic HFrEF History of MVR, redo bioprosthetic AVR with paroxysmal Afib - on heparin SQ for DVT ppx  - will need to restart anticoagulation  with likely Eliquis. Need to speak with family about risk/benefit of this    AKI on CKD stage 3b, improving - Trend BMP - Replete electrolytes as indicated - Monitor I&Os, assess diuresis needs daily - Avoid nephrotoxic agents as able - Ensure adequate renal perfusion   Mild thrombocytopenia, improving Chronic anemia - Trend H&H, Plt - Monitor for signs of active bleeding - Transfuse for Hgb < 7.0, Plt < 20K or hemodynamically significant bleeding  H/o GERD - PPI  Hypoglycemia Likely from stopping tube feeds. Based swallow eval for dysphagia 1 diet. However, mental status precludes consistent nutrition  -  monitor cbg q4h  - can start d5 if needed  - family convo for cortrak for supplemental TF 2/12   Moderate protein energy malnutrition - family convo for cortrak 2/12 since not reliably swallowing   Deconditioning - PT/OT/SLP as able  Best Practice: (right click and "Reselect all SmartList Selections" daily)   Diet/type: dysphagia diet (see orders) DVT prophylaxis SCD SQH  Pressure ulcer(s): N/A GI prophylaxis: PPI Lines: Central line and yes and it is still needed- need for prolonged pressors Foley:  N/A Code Status:  limited Last date of multidisciplinary goals of care discussion: made DNR 2/10 with one way extubation. Ongoing GOC discussion regarding cortrak and supplemental TF given waxing/waning mental status.   Critical care time: N/A   Lenard Galloway [facility] 02/19/23 8:18 AM  Please see [email or web address] for pager details.  From 7A-7P if no response, please call [phone] After hours, please call ELink [phone]

## 2023-02-19 NOTE — Progress Notes (Signed)
Palliative:  HPI: 78 y.o. female with past medical history of mitral valve replacement, a-fib on warfarin, endocarditis, hypertension, and vascular dementia admitted on 02/07/2023 after being found unresponsive by her daughter. On arrival to the ED, she was noted to have pinpoint pupils, left hemiparesis and right gaze deviation. She was intubated for airway protection. 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 MRI brain revealed no stroke. Patient was given Ativan and loaded with Keppra. Neurosurgery felt that the small size of the subdural hematoma did not warrant surgical intervention. 1/31 intubated for airway protection. 2/3 +RSV. PCCM with ongoing GOC conversations and plans for one way extubation 2/10.  Failure to thrive.    I met today with Ms. Walker but no family at bedside. She is very lethargic. She does awaken but briefly. Plans for MBS today. I followed up after MBS - she was approved for diet. Unfortunately her mental status will likely impede her ability to have adequate nutrition. She continues with weak cough and unable to expectorate. Requiring NTS. Discussed concerns with SLP, RN, and Dr. Chestine Spore. No family to bedside today. I will call and speak with them tomorrow to follow up on concerns if not present. Time for outcomes.   Exam: Awakens briefly but inconsistently. Eyes open but returns to sleep with ease. Difficult to keep her awake. RN reports occasional verbal response and following commands but inconsistently. Does not follow commands for me. Fairly good cough but unable to expectorate. Abd soft. Warm to touch.    Plan: - DNR/DNI - Time for outcomes  25 min  Yong Channel, NP Palliative Medicine Team Pager [phone] (Please see [email or web address] for schedule) Team Phone [phone]

## 2023-02-19 NOTE — TOC Progression Note (Signed)
Transition of Care [facility]) - Progression Note    Patient Details  Name: [name] MRN: [id number] Date of Birth: 01/03/1946  Transition of Care [facility]) CM/SW Contact  Delilah Shan, LCSWA Phone Number: 02/19/2023, 5:11 PM  Clinical Narrative:      CSW following to fax patient out for SNF closer to patient being medically ready for dc. CSW will continue to follow.   Expected Discharge Plan: Skilled Nursing Facility Barriers to Discharge: Continued Medical Work up  Expected Discharge Plan and Services In-house Referral: Clinical Social Work Discharge Planning Services: CM Consult   Living arrangements for the past 2 months: Apartment                                       Social Determinants of Health (SDOH) Interventions SDOH Screenings   Food Insecurity: No Food Insecurity (02/09/2023)  Housing: Low Risk  (02/09/2023)  Transportation Needs: No Transportation Needs (02/09/2023)  Utilities: Not At Risk (02/09/2023)  Alcohol Screen: Low Risk  (09/02/2022)  Depression (PHQ2-9): Low Risk  (01/20/2023)  Financial Resource Strain: Low Risk  (09/02/2022)  Physical Activity: Insufficiently Active (09/02/2022)  Social Connections: Socially Isolated (02/09/2023)  Stress: No Stress Concern Present (09/02/2022)  Tobacco Use: Medium Risk (02/07/2023)  Health Literacy: Adequate Health Literacy (09/02/2022)    Readmission Risk Interventions     No data to display

## 2023-02-19 NOTE — Progress Notes (Signed)
Nutrition Follow-up  DOCUMENTATION CODES:   Not applicable  INTERVENTION:   Oral diet as tolerated. Noted possible Cortrak placement pending GOC.   Tube Feeding Recommendations if Cortrak place:  Osmolite 1.2 at 55 ml/hr TF provides 73 g of protein, 1584 kcals and 1069 mL of free water  NUTRITION DIAGNOSIS:   Inadequate oral intake related to acute illness as evidenced by NPO status.  Continues but being addressed  GOAL:   Patient will meet greater than or equal to 90% of their needs  Not Met  MONITOR:   PO intake, Weight trends, Labs, Supplement acceptance  REASON FOR ASSESSMENT:   Consult Enteral/tube feeding initiation and management (Trickle start)  ASSESSMENT:   78 yo female admitted after being found unresponsive by daughter, bradycardic and hypotensive but did not lose pulse, intubated for airway protection, possible seizure.  PMH includes HTN, HLD, stroke, mitral stenosis requiring MVR, CKD 3, vascular dementia  1/31 CT head with R SDH,  MRI: no evidence of acute infarct, right SDH-no mass effect, no midline shift 2/02 TF initiated 2/10 One way extubation, TF discontinued 2/11 MBS by SLP with diet advancement to Dysphagia 1, Thins 2/12 Mental status waxes and wanes, MD discussed possible Cortrak placement for meds and nutrition with pt's family-decision pending  Pt not following commands, mumbling. Remains on Dysphagia 1 diet with po intake when alert and safe for po. RN does not feel pt is currently appropriate to take meds by mouth.   Noted PCCM discussed possible Cortrak placement with pt's family for meds/nutrition; decision pending  Current wt 59.8 kg, admit wt 60.4 kg  +BM, rectal tube has been removed  Labs: CBGs 49-183, sodium 135 (wdl), potassium 5.1 wdl Meds: solumedrol, midodrine   Diet Order:   Diet Order             DIET - DYS 1 Room service appropriate? Yes with Assist; Fluid consistency: Thin  Diet effective now                    EDUCATION NEEDS:   Not appropriate for education at this time  Skin:  Skin Assessment: Skin Integrity Issues: Skin Integrity Issues:: Unstageable, Other (Comment) Unstageable: lip Other: skin tear, MASD to perineum  Last BM:  2/10  Height:   Ht Readings from Last 1 Encounters:  02/09/23 5\' 4"  (1.626 m)    Weight:   Wt Readings from Last 1 Encounters:  02/19/23 59.8 kg    BMI:  Body mass index is 22.63 kg/m.  Estimated Nutritional Needs:   Kcal:  1450-1650 kcals  Protein:  70-85 g  Fluid:  >/= 1.5 L  Romelle Starcher MS, RDN, LDN, CNSC Registered Dietitian 3 Clinical Nutrition RD Inpatient Contact Info in Amion

## 2023-02-19 NOTE — Progress Notes (Signed)
eLink Physician-Brief Progress Note Patient Name: [name] DOB: 1945/08/19 MRN: [id number]   Date of Service  02/19/2023  HPI/Events of Note  Patient has been refusing to eat/drink/take meds.  Attempted to give PO meds tonight, patient refusing.  eICU Interventions  No changes.  Family discussing potential coretrack placement and feeding tube but no intervention meantime.  No critical enteral meds identified.  Can continue to hold until patient is amenable.   00.39 - Initiate dextrose infusion for severe hypoglycemia  Intervention Category Minor Interventions: Routine modifications to care plan (e.g. PRN medications for pain, fever)  Tylee Newby 02/19/2023, 10:47 PM

## 2023-02-19 NOTE — Progress Notes (Signed)
Speech Language Pathology Treatment: Dysphagia  Patient Details Name: [name] MRN: [id number] DOB: 1945-10-30 Today's Date: 02/19/2023 Time: 2952-8413 SLP Time Calculation (min) (ACUTE ONLY): 15 min  Assessment / Plan / Recommendation Clinical Impression  No meaningful improvement since yesterday's assessment. Pt has brief periods of alertness, during which she accepts a few bites/sips, then becomes sleepy and poorly participatory. She accepted five <tspn-sized bites of chocolate pudding, a few small sips of water during our session, then was no longer alert enough to continue. Issue is not related to any any pathophysiology of the swallow, she is just not awake enough to eat.   Recommend continuing to offer her dysphagia 1/thin liquids as she is able.  Getting oral meds in her has been a challenge per RN.    SLP will follow.  Palliative care is on board.   HPI HPI: Pt is a 78 yo female who was found down by daughter and brought to ED 1/31 where she was noted to have pinpoint pupils, L hemiparesis, and right gaze deviation. She was intubated for airway protection. 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 MRI brain revealed no stroke. 2/10 - one way extubation (ETT ten days). PMH: dementia with memory loss but  functional soical communication with family; acute CHF, HLD, HTN,  Prosthetic valve dysfunction (07/22/2014), S/P MVR (mitral valve replacement) (04/11/1998), S/P redo mitral valve replacement with bioprosthetic valve (07/25/2014), Stroke, Thrombosis of prosthetic heart valve (07/25/2014), and Valvular endocarditis (2000)      SLP Plan  Continue with current plan of care      Recommendations for follow up therapy are one component of a multi-disciplinary discharge planning process, led by the attending physician.  Recommendations may be updated based on patient status, additional functional  criteria and insurance authorization.    Recommendations  Diet recommendations: Dysphagia 1 (puree);Thin liquid Liquids provided via: Cup;Teaspoon Medication Administration: Crushed with puree Supervision: Trained caregiver to feed patient Compensations: Minimize environmental distractions;Slow rate;Small sips/bites                  Oral care BID   Frequent or constant Supervision/Assistance Dysphagia, oropharyngeal phase (R13.12)     Continue with current plan of care   [name] L. Margaret [name], MA CCC/SLP Clinical Specialist - Acute Care SLP Acute Rehabilitation Services Office number [phone]   [name] [name]  02/19/2023, 12:50 PM

## 2023-02-20 DIAGNOSIS — Z515 Encounter for palliative care: Secondary | ICD-10-CM | POA: Diagnosis not present

## 2023-02-20 DIAGNOSIS — Z7189 Other specified counseling: Secondary | ICD-10-CM | POA: Diagnosis not present

## 2023-02-20 DIAGNOSIS — G9340 Encephalopathy, unspecified: Secondary | ICD-10-CM | POA: Diagnosis not present

## 2023-02-20 DIAGNOSIS — I5022 Chronic systolic (congestive) heart failure: Secondary | ICD-10-CM | POA: Diagnosis not present

## 2023-02-20 DIAGNOSIS — E46 Unspecified protein-calorie malnutrition: Secondary | ICD-10-CM | POA: Diagnosis not present

## 2023-02-20 DIAGNOSIS — R1310 Dysphagia, unspecified: Secondary | ICD-10-CM

## 2023-02-20 DIAGNOSIS — J210 Acute bronchiolitis due to respiratory syncytial virus: Secondary | ICD-10-CM | POA: Diagnosis not present

## 2023-02-20 DIAGNOSIS — F0150 Vascular dementia without behavioral disturbance: Secondary | ICD-10-CM | POA: Diagnosis not present

## 2023-02-20 DIAGNOSIS — J9601 Acute respiratory failure with hypoxia: Secondary | ICD-10-CM | POA: Diagnosis not present

## 2023-02-20 LAB — GLUCOSE, CAPILLARY
Glucose-Capillary: 108 mg/dL — ABNORMAL HIGH (ref 70–99)
Glucose-Capillary: 119 mg/dL — ABNORMAL HIGH (ref 70–99)
Glucose-Capillary: 125 mg/dL — ABNORMAL HIGH (ref 70–99)
Glucose-Capillary: 140 mg/dL — ABNORMAL HIGH (ref 70–99)
Glucose-Capillary: 45 mg/dL — ABNORMAL LOW (ref 70–99)
Glucose-Capillary: 47 mg/dL — ABNORMAL LOW (ref 70–99)
Glucose-Capillary: 77 mg/dL (ref 70–99)
Glucose-Capillary: 77 mg/dL (ref 70–99)
Glucose-Capillary: 95 mg/dL (ref 70–99)

## 2023-02-20 LAB — BASIC METABOLIC PANEL
Anion gap: 7 (ref 5–15)
BUN: 39 mg/dL — ABNORMAL HIGH (ref 8–23)
CO2: 32 mmol/L (ref 22–32)
Calcium: 8.6 mg/dL — ABNORMAL LOW (ref 8.9–10.3)
Chloride: 97 mmol/L — ABNORMAL LOW (ref 98–111)
Creatinine, Ser: 1.75 mg/dL — ABNORMAL HIGH (ref 0.44–1.00)
GFR, Estimated: 30 mL/min — ABNORMAL LOW (ref >60)
Glucose, Bld: 140 mg/dL — ABNORMAL HIGH (ref 70–99)
Potassium: 4.6 mmol/L (ref 3.5–5.1)
Sodium: 136 mmol/L (ref 135–145)

## 2023-02-20 MED ORDER — DEXTROSE 50 % IV SOLN
INTRAVENOUS | Status: AC
Start: 2023-02-20 — End: 2023-02-20
  Administered 2023-02-20: 25 g via INTRAVENOUS
  Filled 2023-02-20: qty 50

## 2023-02-20 MED ORDER — LACTATED RINGERS IV BOLUS
500.0000 mL | Freq: Once | INTRAVENOUS | Status: AC
  Administered 2023-02-20: 500 mL via INTRAVENOUS

## 2023-02-20 MED ORDER — DEXTROSE 50 % IV SOLN
25.0000 g | INTRAVENOUS | Status: AC
  Administered 2023-02-20: 25 g via INTRAVENOUS

## 2023-02-20 MED ORDER — DEXTROSE 10 % IV SOLN
INTRAVENOUS | Status: DC
  Filled 2023-02-20: qty 1000

## 2023-02-20 MED ORDER — DEXTROSE 50 % IV SOLN
INTRAVENOUS | Status: AC
  Administered 2023-02-20: 25 g via INTRAVENOUS
  Filled 2023-02-20: qty 50

## 2023-02-20 NOTE — Progress Notes (Signed)
Hypoglycemic Event  CBG: 47  Treatment: D50 50 mL (25 gm)  Symptoms: None  Follow-up CBG: Time: 0043 CBG Result:45  Possible Reasons for Event: Inadequate meal intake  Comments/MD notified: E link notified    [name]

## 2023-02-20 NOTE — Plan of Care (Signed)

## 2023-02-20 NOTE — Progress Notes (Signed)
Hypoglycemic Event  CBG: 45  Treatment: D50 50 mL (25 gm)  Symptoms: None  Follow-up CBG: Time:0114 CBG Result:95  Possible Reasons for Event: Inadequate meal intake  Comments/MD notified: E link notified    [name]

## 2023-02-20 NOTE — Progress Notes (Signed)
Palliative:  HPI: 78 y.o. female with past medical history of mitral valve replacement, a-fib on warfarin, endocarditis, hypertension, and vascular dementia admitted on 02/07/2023 after being found unresponsive by her daughter. On arrival to the ED, she was noted to have pinpoint pupils, left hemiparesis and right gaze deviation. She was intubated for airway protection. 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 MRI brain revealed no stroke. Patient was given Ativan and loaded with Keppra for potential seizure. Neurosurgery felt that the small size of the subdural hematoma did not warrant surgical intervention. 1/31 intubated for airway protection. 2/3 +RSV. PCCM with ongoing GOC conversations and plans for one way extubation 2/10.   I met today with [name] and son, [name]. [name] was able to help his mother to eat an applesauce. We had a long discussion today of her current status and concerns. We discussed that she needs recurrent NTS as she is unable to expectorate and clear her lungs - we discussed the role of dementia in this process. We discussed the risks vs benefits of encouraging intake. We reviewed in detail aspiration and how/why this happens particularly in dementia. I expressed my concern for overall poor prognosis and poor progress. We reviewed decisions ahead. We discussed Cortrak and that this would be a way to deliver her calories and hydration but this is a temporary measures and is not reversing her problems with dementia and clearing her lungs. [name] clarifies that his mother will die without artifical feeding and ultimately we believe this to be true but I also clarified that a Cortrak will not necessarily change her trajectory and make her any better than she is currently. [name] expresses understanding. We discussed the importance of the larger picture of her declining health and her  declined quality of life. [name] expresses (similar to his sister [name]) that he does not want his mother to suffer.   [name] texts and speaks with [name] via telephone - I was present and she had no specific questions for me. They have decided to proceed with Cortrak placement. I spoke further with [name] that we need to take this time to monitor his mother closely. I explained that if she continues to be sleepy, poor intake, and unable to clear her lungs even with Cortrak then there would not be any benefit to consider a surgery for a long term feeding tube if this is not helping her to improve as hoped. I also explained the struggles with dementia and sometimes patients can go from a good functional status (like his mother) to end stage very quickly with even a minor infection or injury. [name] shares knowledge of a friend who had a family member that died very quickly after developing swallowing issues in the setting of dementia. He plans to continue to speak with his siblings.   All questions/concerns addressed to the best of my ability. Emotional support provided.   Exam: Awakens briefly but inconsistently. Eyes open but returns to sleep with ease. Difficult to keep her awake. RN reports occasional verbal response and following commands but inconsistently. Does not follow commands for me. Fairly good cough but unable to expectorate. Abd soft. Warm to touch.    Plan: - Family wish to pursue Cortrak. - Will need ongoing support and goals of care discussions.  - Family are naturally struggling with poor prognosis and path forward.   60 min  Yong Channel, NP Palliative Medicine Team  Pager [phone] (Please see [email or web address] for schedule) Team Phone [phone]

## 2023-02-20 NOTE — Progress Notes (Signed)
Notified E-Link about patients recurrent hypoglycemia. Patient refuses to eat/drink/speak. Requesting a maintenance fluid containing dextrose.

## 2023-02-20 NOTE — Plan of Care (Signed)
New transfer from CVICU for code stroke, encephalopathy. Patient is lethargic, 4L [place], VSS. Unable to ascertain mentation. NPI at [facility] for cotrack placement tomorrow. GOC meeting with family this week.   Problem: Clinical Measurements: Goal: Ability to maintain clinical measurements within normal limits will improve Outcome: Not Met (add Reason) Goal: Will remain free from infection Outcome: Not Met (add Reason) Goal: Diagnostic test results will improve Outcome: Not Met (add Reason) Goal: Respiratory complications will improve Outcome: Not Met (add Reason) Goal: Cardiovascular complication will be avoided Outcome: Not Met (add Reason)   Problem: Pain Managment: Goal: General experience of comfort will improve and/or be controlled Outcome: Not Met (add Reason)   Problem: Skin Integrity: Goal: Risk for impaired skin integrity will decrease Outcome: Not Met (add Reason)

## 2023-02-20 NOTE — Progress Notes (Signed)
  Progress Note   Patient: [name] EAV:[id number] DOB: 1945-06-15 DOA: 02/07/2023     13 DOS: the patient was seen and examined on 02/20/2023   Brief hospital course: Patient was brought into the hospital following being found unresponsive by her daughter, last known well 730 this morning She was found when daughter came back from work, difficulty breathing, EMS activated.  Was bradycardic, hypotensive on initial evaluation, pinpoint pupils, facial droop.  No response to Narcan Intubated in the ED for airway protection. Daughter is unclear whether she may have had a seizure. Had some chest congestion over the last week.Was her usual self this morning when she was last seen.   Assessment and Plan: Acute respiratory failure with hypoxia due to RSV infection Wheezing due to RSV   - Pulmicort/brovana bid - Yupelri 175 mcg neb daily  - Duoneb q4 hr PRN   - IV solumedrol 40 mg daily    Acute encephalopathy with baseline vascular dementia New-onset seizures SDH - IV keppra 500 mg bid  - IV valproate 325 mg q8hr  - Aricept 10 mg PO at bedtime  - Lipitor 40 mg PO daily    Hypotension - Resolved    Chronic HFrEF History of MVR, redo bioprosthetic AVR with paroxysmal Afib - Monitor    AKI on CKD stage 3b; - Monitor/stable  - Getting cortrack 2/14 and will be able to give her free water   Mild thrombocytopenia, improving Chronic anemia - Monitor    H/o GERD - IV protonix 40 mg daily    Hypoglycemia - IV D10 @ 20 cc/hr  -Cortrack to 2/14   Moderate protein energy malnutrition - Cortrack to 2/14 and will start supplemental tube feeds afterward   Deconditioning - PT/OT/SLP as able  Subjective: Pt seen and examined at the bedside. IV D10 initiated. Cortrack placement for 2/14. Continue supportive care as above.  Physical Exam: Vitals:   02/20/23 0700 02/20/23 0800 02/20/23 0900 02/20/23 1200  BP: 121/76 132/84 138/76   Pulse: 68 80 73   Resp: 11 17 17     Temp:    (!) 96.1 F (35.6 C)  TempSrc:    Axillary  SpO2: 100% 98% 95%   Weight:      Height:       Physical Exam Constitutional:      Comments: Nonverbal   HENT:     Head: Normocephalic.  Cardiovascular:     Rate and Rhythm: Normal rate.  Pulmonary:     Effort: Pulmonary effort is normal.  Abdominal:     Palpations: Abdomen is soft.  Musculoskeletal:     Cervical back: Neck supple.  Skin:    General: Skin is warm.  Neurological:     Comments: Unable to evaluate   Psychiatric:     Comments: Unable to evaluate       Disposition: Status is: Inpatient Remains inpatient appropriate because: Cortrack placement 2/14  Planned Discharge Destination:  Dispo per pt's clinical progress    Time spent: 35 minutes  Author: Baron Hamper , MD 02/20/2023 2:19 PM  For on call review [email or web address].

## 2023-02-20 NOTE — Progress Notes (Signed)
Physical Therapy Treatment Patient Details Name: [name] MRN: [id number] DOB: 1945/09/23 Today's Date: 02/20/2023   History of Present Illness Pt is a 78yo female who was found down by daughter and brought to ED where she was noted to have pinpoint pupils, L hemiparesis, and right gaze deviation. She was intubated for airway protection. 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 MRI brain revealed no stroke. 2/10 - one way extubation PMH: Acute CHF, HLD, HTN,  Prosthetic valve dysfunction (07/22/2014), S/P MVR (mitral valve replacement) (04/11/1998), S/P redo mitral valve replacement with bioprosthetic valve (07/25/2014), Stroke, Thrombosis of prosthetic heart valve (07/25/2014), and Valvular endocarditis (2000)    PT Comments  Pt is presenting with a decline in functional mobility since last session. Pt was total assist for all functional mobility and was limited with OOB mobility due to cognition and vitals. Due to pt current functional status, home set up and available assistance at home recommending skilled physical therapy services < 3 hours/day in order to address strength, balance and functional mobility to decrease risk for falls, injury, immobility, skin break down and re-hospitalization. Will continue to assess pt progress for appropriateness of recommendation.      If plan is discharge home, recommend the following: A lot of help with walking and/or transfers;A lot of help with bathing/dressing/bathroom;Help with stairs or ramp for entrance;Supervision due to cognitive status;Assist for transportation   Can travel by private vehicle     No  Equipment Recommendations  Wheelchair (measurements PT);Wheelchair cushion (measurements PT);Hoyer lift;Hospital bed       Precautions / Restrictions Precautions Precautions: Fall Restrictions Weight Bearing Restrictions Per Provider Order: No      Mobility  Bed Mobility Overal bed mobility: Needs Assistance Bed Mobility: Supine to Sit, Sit to Supine     Supine to sit: Total assist Sit to supine: Total assist   General bed mobility comments: Pt was total assist for bed mobility with Max multimodal cueing pt began to move bil LE toward EOB then stopped and did not assist further.    Transfers   General transfer comment: Attempted to begin standing with pt feet on floor and hands in place. pt had 41 beat run of PVC"s and O2 sats to 81%. Pt assisted back to supine. Continued wtih run of PVC's. RN aware; O2 sats back up to above 90%    Ambulation/Gait   General Gait Details: unable this date      Balance Overall balance assessment: Needs assistance Sitting-balance support: Feet supported, No upper extremity supported, Bilateral upper extremity supported, Feet unsupported Sitting balance-Leahy Scale: Poor Sitting balance - Comments: poor sitting balance Min A at all times with intermittent Mod A Postural control: Other (comment) (multi directional loss of balance. Pt has no preference)        Communication Communication Communication: Other (comment) (soft spoken)  Cognition Arousal: Lethargic Behavior During Therapy: Flat affect   PT - Cognitive impairments: History of cognitive impairments         PT - Cognition Comments: pt with simple command following <10% of time. Pt did demo rare purposeful movement with LEs ie moving legs toward EOB. Pt with noted delayed processing however can be impulsive as well Following commands: Impaired Following commands impaired: Follows one step commands inconsistently    Cueing Cueing Techniques: Verbal cues, Tactile cues, Visual cues     General Comments General comments (skin integrity, edema, etc.): pt on  4L o2 via [place] throughout session. Once sitting EOB pt had declien in O2 sats with good pleth line to 81% after sitting ~ 4 min      Pertinent Vitals/Pain Pain  Assessment Pain Assessment: Faces Faces Pain Scale: No hurt Breathing: normal Negative Vocalization: none Facial Expression: smiling or inexpressive Body Language: relaxed Consolability: no need to console PAINAD Score: 0 Pain Intervention(s): Monitored during session     PT Goals (current goals can now be found in the care plan section) Acute Rehab PT Goals Patient Stated Goal: didn't state PT Goal Formulation: Patient unable to participate in goal setting Time For Goal Achievement: 03/04/23 Potential to Achieve Goals: Good Progress towards PT goals: Not progressing toward goals - comment (pt is limited cognitively at this time.)    Frequency    Min 1X/week      PT Plan  Continue with current POC         AM-PAC PT "6 Clicks" Mobility   Outcome Measure  Help needed turning from your back to your side while in a flat bed without using bedrails?: Total Help needed moving from lying on your back to sitting on the side of a flat bed without using bedrails?: Total Help needed moving to and from a bed to a chair (including a wheelchair)?: Total Help needed standing up from a chair using your arms (e.g., wheelchair or bedside chair)?: Total Help needed to walk in hospital room?: Total Help needed climbing 3-5 steps with a railing? : Total 6 Click Score: 6    End of Session Equipment Utilized During Treatment: Oxygen Activity Tolerance: Treatment limited secondary to medical complications (Comment) Patient left: in bed;with call bell/phone within reach;with bed alarm set Nurse Communication: Mobility status;Other (comment) (Rn aware of Heart rhythm and o2 sats) PT Visit Diagnosis: Unsteadiness on feet (R26.81);Muscle weakness (generalized) (M62.81)     Time: 6213-0865 PT Time Calculation (min) (ACUTE ONLY): 18 min  Charges:    $Therapeutic Activity: 8-22 mins PT General Charges $$ ACUTE PT VISIT: 1 Visit                    Harrel Carina, DPT, CLT  Acute  Rehabilitation Services Office: [phone] (Secure chat preferred)    Claudia Desanctis 02/20/2023, 3:16 PM

## 2023-02-20 NOTE — Progress Notes (Signed)
NAME:  [name], MRN:  [id number], DOB:  [birth year], LOS: 13 ADMISSION DATE:  02/07/2023, CONSULTATION DATE:  02/07/2023 REFERRING MD: Rubin Payor - EDP, CHIEF COMPLAINT:  AMS, code stroke   History of Present Illness:  Patient was brought into the hospital following being found unresponsive by her daughter, last known well 730 this morning She was found when daughter came back from work, difficulty breathing, EMS activated.  Was bradycardic, hypotensive on initial evaluation, pinpoint pupils, facial droop.  No response to Narcan Intubated in the ED for airway protection  Daughter is unclear whether she may have had a seizure  Had some chest congestion over the last week Was her usual self this morning when she was last seen  Pertinent  Medical History   Past Medical History:  Diagnosis Date   Acute CHF (congestive heart failure) (HCC) 12/14/2014   Hyperlipidemia    Hypertension    Prosthetic valve dysfunction 07/22/2014   Acute mitral stenosis due to restricted leaflet mobility of bileaflet mechanical mitral valve prosthesis   S/P MVR (mitral valve replacement) 04/11/1998   #29 St. 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  No mass effect, no midline shift.  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  Required intubation for airway protection.  Seen by neurosurgery felt not a candidate for surgery with small acute on chronic subdurals recommended against reversal of warfarin given concern about  basilar artery occlusion.  Seen by neurology, EEG ordered, started on Keppra.  Hypotensive, started on norepinephrine 2/1 EEG suggesting moderate to severe diffuse encephalopathy but no seizure or epileptiform discharges.  Repeat CT head ordered, and showing no new changes 2/2 eeg w areas of increased epileptogenicity + slowing. Off cont sedation. ECHO  w MV gradient change and decr EF 2/3  + RSV; EEG in place which is showing high risk of epileptogenicity but no active seizures; neuro added valproic acid 2/5 plan to cont through the weekend and re-eval GOC . INR 3.6 not on AC 2/6 recheck INR  2/8 more awake. Met with family. DNR. Discussing possible 1-way extubation 2/10 One-way extubation completed. Tolerated well. 2/11: was signed off on dysphagia diet  2/12: mental status waxes and wanes. Family convo about cortrak possibly for ongoing med needs, supplemental TF 2/13: family agrees to cortrak   Interim History / Subjective:  NAEON.  No change in mental status overnight.  Family spoke with palliative care yesterday and today.  They have decided to go forth with core track placement.  This will be done tomorrow 2/14.  She has worsening renal function with creatinine going from 1.5-> 1.7.  I think she is probably dehydrated.  Will give another 500 LR bolus.  After cortrack can probably go out to floor.  Objective   Blood pressure 138/76, pulse 73, temperature (!) 96.1 F (35.6 C), temperature source Axillary, resp. rate 17, height 5\' 4"  (1.626 m), weight 68.1 kg, SpO2 95%.        Intake/Output Summary (Last 24 hours) at 02/20/2023 1340 Last data filed at 02/20/2023 0700 Gross  per 24 hour  Intake 743.81 ml  Output 131 ml  Net 612.81 ml   Filed Weights   02/19/23 0500 02/19/23 2300 02/20/23 0500  Weight: 59.8 kg 68.1 kg 68.1 kg   Physical Examination: General: acute on chronically ill appearing, older female, laying in bed, no acute distress  HEENT: anicteric sclera, PERRLA, mucous  membranes dry  Neuro: awake but appears drowsy. Looks at provider to verbal command.  She does state her name and that she is not in pain.  Otherwise answers incoherently.  Globally very weak.  Weak cough.   CV: s1s2, rrr, no murmur, rub, gallop  PULM: respirations even and unlabored. Productive cough, weak. On 4LNC. NTS PRN GI: rounded, soft, ntnd Extremities: No significant LE edema noted. Skin: Warm/dry  Resolved [facility] Problem List:   Lactic acidosis Recurrent hypoglycemia Possible aspiration pneumonia  s/p 5-day Rocephin   Assessment & Plan:  Acute respiratory failure with hypoxia due to RSV infection Wheezing due to RSV   - Add hypertonic saline nebs to help with NT suction - con't triple therapy with brovana, pulmicort, yupelri  - titrate solu-medrol down to 40mg  daily  - supplemental O2 for sat >90%  - duoneb PRN  - NTS as needed  - aggressive pulm hygiene  - one way extubation 2/10. Supplemental O2 support for O2 sat > 90% - palliative following   Acute encephalopathy with baseline vascular dementia New-onset seizures SDH - con't Keppra, valproate  - con't modafinil, Aricept - frequent reorientation and neuro checks - PT/OT/SLP as below   Hypotension, resolved -Midodrine stopped  Chronic HFrEF History of MVR, redo bioprosthetic AVR with paroxysmal Afib - on heparin SQ for DVT ppx  - will need to restart anticoagulation  with likely Eliquis. Need to speak with family about risk/benefit of this    AKI on CKD stage 3b; Uptrending creatinine.  Think this is likely prerenal in the setting of dehydration as she has poor p.o. intake.  Received 500 LR bolus 2/12 with no improvement, however overall feel that she is still very dry. - Repeat 500 LR bolus today - Trend BMP - Replete electrolytes as indicated - Monitor I&Os, assess diuresis needs daily - Avoid nephrotoxic agents as able - Ensure adequate renal perfusion - Getting cortrack 2/14 and will be able to give  her free water  Mild thrombocytopenia, improving Chronic anemia - Trend H&H, Plt - Monitor for signs of active bleeding - Transfuse for Hgb < 7.0, Plt < 20K or hemodynamically significant bleeding  H/o GERD - PPI  Hypoglycemia Likely from stopping tube feeds. Based swallow eval for dysphagia 1 diet. However, mental status precludes consistent nutrition  - monitor cbg q4h  - can start d5 if needed  -Cortrack to 2/14  Moderate protein energy malnutrition -Cortrack to 14 and will start supplemental tube feeds afterward   Deconditioning - PT/OT/SLP as able  GOC: Primarily being driven by palliative care, appreciate assistance.  Palliative care spoke with family on 2/12 and helped relate likely overall poor prognosis given her baseline chronic medical conditions including dementia.  The son yesterday was very overwhelmed with this discussion.  Will need ongoing, likely in person, goals of care discussion with the team when the children are able to visit together.  Best Practice: (right click and "Reselect all SmartList Selections" daily)   Diet/type: dysphagia diet (see orders) and NPO DVT prophylaxis SCD SQH  Pressure ulcer(s): N/A GI prophylaxis: PPI Lines: Central line and yes and it is still needed-  need for prolonged pressors Foley:  N/A Code Status:  limited Last date of multidisciplinary goals of care discussion: made DNR 2/10 with one way extubation. Ongoing GOC discussion  Critical care time: N/A   Cristopher Peru, PA-C [facility] 02/20/23 1:40 PM  Please see [email or web address] for pager details.  From 7A-7P if no response, please call [phone] After hours, please call ELink [phone]

## 2023-02-21 ENCOUNTER — Inpatient Hospital Stay (HOSPITAL_COMMUNITY): Payer: 59

## 2023-02-21 DIAGNOSIS — G9340 Encephalopathy, unspecified: Secondary | ICD-10-CM | POA: Diagnosis not present

## 2023-02-21 DIAGNOSIS — Z7189 Other specified counseling: Secondary | ICD-10-CM | POA: Diagnosis not present

## 2023-02-21 LAB — BASIC METABOLIC PANEL
Anion gap: 8 (ref 5–15)
BUN: 35 mg/dL — ABNORMAL HIGH (ref 8–23)
CO2: 30 mmol/L (ref 22–32)
Calcium: 8.3 mg/dL — ABNORMAL LOW (ref 8.9–10.3)
Chloride: 99 mmol/L (ref 98–111)
Creatinine, Ser: 1.56 mg/dL — ABNORMAL HIGH (ref 0.44–1.00)
GFR, Estimated: 34 mL/min — ABNORMAL LOW (ref >60)
Glucose, Bld: 87 mg/dL (ref 70–99)
Potassium: 4.4 mmol/L (ref 3.5–5.1)
Sodium: 137 mmol/L (ref 135–145)

## 2023-02-21 LAB — GLUCOSE, CAPILLARY
Glucose-Capillary: 88 mg/dL (ref 70–99)
Glucose-Capillary: 74 mg/dL (ref 70–99)
Glucose-Capillary: 94 mg/dL (ref 70–99)
Glucose-Capillary: 138 mg/dL — ABNORMAL HIGH (ref 70–99)
Glucose-Capillary: 135 mg/dL — ABNORMAL HIGH (ref 70–99)
Glucose-Capillary: 102 mg/dL — ABNORMAL HIGH (ref 70–99)

## 2023-02-21 MED ORDER — DONEPEZIL HCL 10 MG PO TABS: 10.0000 mg | ORAL_TABLET | Freq: Every day | ORAL | Status: DC

## 2023-02-21 MED ORDER — ALLOPURINOL 100 MG PO TABS
100.0000 mg | ORAL_TABLET | Freq: Every day | ORAL | Status: DC
  Administered 2023-02-21: 100 mg
  Filled 2023-02-21: qty 1

## 2023-02-21 MED ORDER — ALLOPURINOL 100 MG PO TABS
100.0000 mg | ORAL_TABLET | Freq: Every day | ORAL | Status: DC
  Administered 2023-02-22: 100 mg via ORAL
  Filled 2023-02-21: qty 1

## 2023-02-21 MED ORDER — DOCUSATE SODIUM 50 MG/5ML PO LIQD: 100.0000 mg | Freq: Two times a day (BID) | ORAL | Status: DC | PRN

## 2023-02-21 MED ORDER — ATORVASTATIN CALCIUM 40 MG PO TABS
40.0000 mg | ORAL_TABLET | Freq: Every day | ORAL | Status: DC
  Administered 2023-02-21: 40 mg
  Filled 2023-02-21: qty 1

## 2023-02-21 MED ORDER — OSMOLITE 1.2 CAL PO LIQD
1000.0000 mL | ORAL | Status: DC
  Administered 2023-02-21 – 2023-03-03 (×12): 1000 mL
  Filled 2023-02-21 (×9): qty 1000

## 2023-02-21 MED ORDER — SODIUM CHLORIDE 0.9 % IV SOLN: INTRAVENOUS | Status: AC

## 2023-02-21 MED ORDER — DONEPEZIL HCL 10 MG PO TABS
10.0000 mg | ORAL_TABLET | Freq: Every day | ORAL | Status: DC
  Administered 2023-02-21 – 2023-02-22 (×2): 10 mg via ORAL
  Filled 2023-02-21 (×2): qty 1

## 2023-02-21 MED ORDER — THIAMINE MONONITRATE 100 MG PO TABS
100.0000 mg | ORAL_TABLET | Freq: Every day | ORAL | Status: DC
  Administered 2023-02-22: 100 mg via ORAL
  Filled 2023-02-21: qty 1

## 2023-02-21 MED ORDER — ATORVASTATIN CALCIUM 40 MG PO TABS
40.0000 mg | ORAL_TABLET | Freq: Every day | ORAL | Status: DC
  Administered 2023-02-22: 40 mg via ORAL
  Filled 2023-02-21: qty 1

## 2023-02-21 MED ORDER — THIAMINE MONONITRATE 100 MG PO TABS
100.0000 mg | ORAL_TABLET | Freq: Every day | ORAL | Status: DC
  Administered 2023-02-21: 100 mg
  Filled 2023-02-21: qty 1

## 2023-02-21 MED ORDER — POLYETHYLENE GLYCOL 3350 17 G PO PACK
17.0000 g | PACK | Freq: Every day | ORAL | Status: DC | PRN
Start: 2023-02-21 — End: 2023-03-04

## 2023-02-21 MED ORDER — PREDNISONE 20 MG PO TABS
40.0000 mg | ORAL_TABLET | Freq: Once | ORAL | Status: AC
  Administered 2023-02-22: 40 mg via ORAL
  Filled 2023-02-21: qty 2

## 2023-02-21 NOTE — TOC Progression Note (Signed)
Transition of Care [facility]) - Progression Note    Patient Details  Name: [name] MRN: [id number] Date of Birth: 23-Aug-1945  Transition of [facility] CM/SW Contact  Baldemar Lenis, [place] Phone Number: 02/21/2023, 3:53 PM  Clinical Narrative:   CSW acknowledging consult for LTAC placement. Patient appears to be a potential LTAC candidate. CSW attempted to reach daughter, [name], and son, [name], to discuss and offer choice, left a voicemail for both. Awaiting call back. CSW to follow.    Expected Discharge Plan: Skilled Nursing Facility Barriers to Discharge: Continued Medical Work up  Expected Discharge Plan and Services In-house Referral: Clinical Social Work Discharge Planning Services: CM Consult   Living arrangements for the past 2 months: Apartment                                       Social Determinants of Health (SDOH) Interventions SDOH Screenings   Food Insecurity: No Food Insecurity (02/09/2023)  Housing: Low Risk  (02/09/2023)  Transportation Needs: No Transportation Needs (02/09/2023)  Utilities: Not At Risk (02/09/2023)  Alcohol Screen: Low Risk  (09/02/2022)  Depression (PHQ2-9): Low Risk  (01/20/2023)  Financial Resource Strain: Low Risk  (09/02/2022)  Physical Activity: Insufficiently Active (09/02/2022)  Social Connections: Socially Isolated (02/09/2023)  Stress: No Stress Concern Present (09/02/2022)  Tobacco Use: Medium Risk (02/07/2023)  Health Literacy: Adequate Health Literacy (09/02/2022)    Readmission Risk Interventions     No data to display

## 2023-02-21 NOTE — Progress Notes (Signed)
Speech Language Pathology Treatment: Dysphagia  Patient Details Name: [name] MRN: [id number] DOB: 29-Nov-1945 Today's Date: 02/21/2023 Time: 4098-1191 SLP Time Calculation (min) (ACUTE ONLY): 15 min  Assessment / Plan / Recommendation Clinical Impression  Although decreased arousal documented during previous therapy sessions today, upon arrival, patient with eyes open, sitting up in bed, and nodding head "yes" in response to offer of pos. SLP provided max physical assistance with feeding and with gentle tactile stimulation to bottom lip, patient began to open oral cavity for acceptance of bolus. She then proceeded to consume 4 ounces of pureed solids without overt s/s of aspiration. Oral transit mildly delayed but functional and efficient. Anterior labial spillage observed at the presentation of cup sips of thin liquid and patient with occasional cough response (also noted prior to intake). Unclear if related to decreased airway protection however based on MBS results, would recommend continuation of current diet. SLP will continue to f/u.    HPI HPI: Pt is a 78 yo female who was found down by daughter and brought to ED 1/31 where she was noted to have pinpoint pupils, L hemiparesis, and right gaze deviation. She was intubated for airway protection. 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 MRI brain revealed no stroke. 2/10 - one way extubation (ETT ten days). PMH: dementia with memory loss but  functional soical communication with family; acute CHF, HLD, HTN,  Prosthetic valve dysfunction (07/22/2014), S/P MVR (mitral valve replacement) (04/11/1998), S/P redo mitral valve replacement with bioprosthetic valve (07/25/2014), Stroke, Thrombosis of prosthetic heart valve (07/25/2014), and Valvular endocarditis (2000)      SLP Plan  Continue with current plan of care      Recommendations for follow up therapy  are one component of a multi-disciplinary discharge planning process, led by the attending physician.  Recommendations may be updated based on patient status, additional functional criteria and insurance authorization.    Recommendations  Diet recommendations: Dysphagia 1 (puree);Thin liquid Liquids provided via: Cup;Teaspoon Medication Administration: Crushed with puree Supervision: Trained caregiver to feed patient Compensations: Minimize environmental distractions;Slow rate;Small sips/bites Postural Changes and/or Swallow Maneuvers: Seated upright 90 degrees                  Oral care BID   Frequent or constant Supervision/Assistance Dysphagia, oropharyngeal phase (R13.12)     Continue with current plan of care    [facility] MA, CCC-SLP  [name]  02/21/2023, 1:23 PM

## 2023-02-21 NOTE — Progress Notes (Signed)
  Progress Note   Patient: [name] ZOX:[id number] DOB: [birth year] DOA: 02/07/2023     14 DOS: the patient was seen and examined on 02/21/2023   Brief hospital course: Patient was brought into the hospital after patient was found unresponsive by her daughter.  Patient was bradycardic, hypotensive on initial evaluation, pinpoint pupils, facial droop.  No response to Narcan Intubated in the ED for airway protection.  Workup done in the hospital revealed subdural hematoma, RSV infection, waxing and waning of mental status in the setting of vascular dementia.  02/21/2023: No new changes.  Assessment and Plan: Acute respiratory failure with hypoxia due to RSV infection Wheezing due to RSV   - Pulmicort/brovana bid - Yupelri 175 mcg neb daily  - Duoneb q4 hr PRN   - IV solumedrol 40 mg daily  02/21/2023: Continue supportive care.   Acute encephalopathy with baseline vascular dementia New-onset seizures SDH - IV keppra 500 mg bid  - IV valproate 325 mg q8hr  - Aricept 10 mg PO at bedtime  - Lipitor 40 mg PO daily  02/21/2023: Monitor and manage expectantly.   Hypotension - Resolving    Chronic HFrEF History of MVR, redo bioprosthetic AVR with paroxysmal Afib - Monitor    CKD stage 3b; - Monitor/stable    Mild thrombocytopenia: -Resolved. -Platelet count of 193 today.  Chronic anemia - Monitor    H/o GERD - IV protonix 40 mg daily    Hypoglycemia - IV D10 @ 20 cc/hr  -Cortrack to 2/14 To 1425: Blood sugar seems more stable today.   Moderate protein energy malnutrition - Cortrack to 2/14 and will start supplemental tube feeds afterward   Deconditioning - PT/OT/SLP as able  Subjective:  -No history from patient.  Physical Exam: Vitals:   02/21/23 0457 02/21/23 0713 02/21/23 0800 02/21/23 1126  BP: 90/74  96/68 98/64  Pulse:  67 62 61  Resp:  15  12  Temp:   97.8 F (36.6 C) 98 F (36.7 C)  TempSrc:   Axillary Axillary  SpO2:  99% 99% 100%   Weight:      Height:       Physical Exam Constitutional:      Comments: Nonverbal   HENT:     Head: Normocephalic.  Cardiovascular:     Rate and Rhythm: Normal rate.  Pulmonary:     Effort: Pulmonary effort is normal.  Abdominal:     Palpations: Abdomen is soft.  Musculoskeletal:     Cervical back: Neck supple.  Skin:    General: Skin is warm.  Neurological:     Comments: Unable to evaluate   Psychiatric:     Comments: Unable to evaluate       Disposition: Status is: Inpatient Remains inpatient appropriate because: Cortrack placement 2/14  Planned Discharge Destination:  Dispo per pt's clinical progress    Time spent: 35 minutes  Author: Barnetta Chapel, MD 02/21/2023 11:57 AM  For on call review [email or web address].

## 2023-02-21 NOTE — Plan of Care (Signed)
Problem: Education: Goal: Knowledge of General Education information will improve Description: Including pain rating scale, medication(s)/side effects and non-pharmacologic comfort measures Outcome: Not Progressing   Problem: Health Behavior/Discharge Planning: Goal: Ability to manage health-related needs will improve Outcome: Not Progressing   Problem: Clinical Measurements: Goal: Ability to maintain clinical measurements within normal limits will improve Outcome: Not Progressing Goal: Will remain free from infection Outcome: Not Progressing Goal: Diagnostic test results will improve Outcome: Not Progressing Goal: Respiratory complications will improve Outcome: Not Progressing

## 2023-02-21 NOTE — Progress Notes (Signed)
Daily Progress Note   Patient Name: [name]       Date: 02/21/2023 DOB: [birth year]  Age: 78 y.o. MRN#: [id number] Attending Physician: Barnetta Chapel, MD Primary Care Physician: Myrlene Broker, MD Admit Date: 02/07/2023  Reason for Consultation/Follow-up: Establishing goals of care  Length of Stay: 14  Current Medications: Scheduled Meds:   arformoterol  15 mcg Nebulization BID   atorvastatin  40 mg Oral Daily   budesonide (PULMICORT) nebulizer solution  0.5 mg Nebulization BID   Chlorhexidine Gluconate Cloth  6 each Topical Daily   donepezil  10 mg Oral QHS   heparin injection (subcutaneous)  5,000 Units Subcutaneous Q8H   methylPREDNISolone (SOLU-MEDROL) injection  40 mg Intravenous Daily   mouth rinse  15 mL Mouth Rinse 4 times per day   pantoprazole (PROTONIX) IV  40 mg Intravenous Q24H   revefenacin  175 mcg Nebulization Daily    Continuous Infusions:  sodium chloride 50 mL/hr at 02/21/23 1124   dextrose 20 mL/hr at 02/21/23 0521   levETIRAcetam 500 mg (02/21/23 0945)   valproate sodium 325 mg (02/21/23 0804)    PRN Meds: docusate, Gerhardt's butt cream, glycopyrrolate, ipratropium-albuterol, mouth rinse, polyethylene glycol, sodium chloride HYPERTONIC  Physical Exam Vitals reviewed.  Constitutional:      General: She is sleeping.     Appearance: She is ill-appearing.  Cardiovascular:     Rate and Rhythm: Normal rate.  Pulmonary:     Effort: Pulmonary effort is normal.  Skin:    General: Skin is warm and dry.  Neurological:     Mental Status: She is easily aroused.  Psychiatric:        Behavior: Behavior normal.             Vital Signs: BP 98/64 (BP Location: Left Arm)   Pulse 61   Temp 98 F (36.7 C) (Axillary)   Resp 12   Ht  5\' 4"  (1.626 m)   Wt 68.1 kg   LMP  (LMP Unknown)   SpO2 100%   BMI 25.77 kg/m  SpO2: SpO2: 100 % O2 Device: O2 Device: Nasal Cannula O2 Flow Rate: O2 Flow Rate (L/min): 3 L/min     Patient Active Problem List   Diagnosis Date Noted   Dysphagia 02/20/2023   Acute encephalopathy 02/19/2023  AKI (acute kidney injury) (HCC) 02/19/2023   Vascular dementia (HCC) 02/15/2023   DNR (do not resuscitate) 02/15/2023   Goals of care, counseling/discussion 02/15/2023   On mechanically assisted ventilation (HCC) 02/15/2023   RSV (acute bronchiolitis due to respiratory syncytial virus) 02/15/2023   RSV infection 02/13/2023   Warfarin toxicity, accidental or unintentional, initial encounter 02/11/2023   RSV (respiratory syncytial virus pneumonia) 02/10/2023   Altered mental status 02/10/2023   Subdural hematoma (HCC) 02/09/2023   Hypotension 02/09/2023   Acute respiratory failure (HCC) 02/09/2023   Encephalopathy acute 02/07/2023   Submandibular lymphadenopathy 01/20/2023   Encounter for general adult medical examination with abnormal findings 10/27/2020   Neck pain 07/05/2017   Seasonal allergic rhinitis 10/04/2016   Chronic gout 08/03/2016   Vascular dementia, uncomplicated (HCC) 05/17/2016   Chest congestion 05/06/2016   S/P mitral valve replacement with bioprosthetic valve 04/25/2015   Essential hypertension 04/25/2015   Chronic anticoagulation 04/25/2015   Intracranial vascular stenosis    Hyperlipidemia    Chronic systolic CHF (congestive heart failure) (HCC) 01/04/2015   CKD (chronic kidney disease) stage 3, GFR 30-59 ml/min (HCC) 01/04/2015   Atrial fibrillation (HCC) 09/22/2014   RBBB 03/11/2014    Palliative Care Assessment & Plan   Patient Profile: 78 y.o. female  with past medical history of mitral valve replacement, a-fib on warfarin, endocarditis, hypertension, and vascular dementia  admitted on 02/07/2023 after being found unresponsive by her daughter.    On  arrival to the ED, she was noted to have pinpoint pupils, left hemiparesis and right gaze deviation. She was intubated for airway protection. 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 MRI brain revealed no stroke. Patient was given Ativan and loaded with Keppra. Neurosurgery felt that the small size of the subdural hematoma did not warrant surgical intervention. 1/31 intubated for airway protection. 2/3 +RSV. PCCM with ongoing GOC conversations and plans for one way extubation 2/10.   Today's Discussion: Patient was lying in bed awake in NAD. She was looking at the foot of her bed where her grandchildren had left a card. I read it out loud and she smiled. I asked her if her grandchildren wrote it and she nodded yes. She closed her eyes and did not further participate in conversation.  1415: Spoke with patient's daughter [name] by phone. Updated her on visit with patient. Discussed the patient had a cortrak placed. She was surprised the cortrak was already placed. She shares that she was not notified of patient moving to [address]. She shares her disappointment that she was not notified of either.  We discussed the purpose of the cortrak to deliver hydration and nutrition. We discussed the cortrak is temporary. The patient currently has a diet-- dysphagia 1 (pureed) and thin liquid. I shared my concern about the patient's weakness and decreased oral intake. I shared that OT, RD, and SLP had seen the patient today. I encouraged [name] to take things one day at a time seeing if the patient progresses with therapies and her diet. [name] understands. Emotional support provided. We plan to discuss more tomorrow when [name] visits the patient.  Recommendations/Plan: DNR Limited scope of treatment Cortrak Time for outcomes PMT to support   Code Status:    Code Status Orders  (From admission, onward)            Start     Ordered   02/15/23 1305  Do not  attempt resuscitation (DNR)- Limited -Do Not Intubate (DNI)  (Code Status)  Continuous       Question Answer Comment  If pulseless and not breathing No CPR or chest compressions.   In Pre-Arrest Conditions (Patient Is Breathing and Has A Pulse) Do not intubate. Provide all appropriate non-invasive medical interventions. Avoid ICU transfer unless indicated or required.   Consent: Discussion documented in EHR or advanced directives reviewed      02/15/23 1304            Extensive chart review has been completed prior to seeing the patient including labs, vital signs, imaging, progress/consult notes, orders, medications, and available advance directive documents.  Time spent: 50 minutes  Care plan was discussed with Dr. Dartha Lodge  Thank you for allowing the Palliative Medicine Team to assist in the care of this patient.    Sherryll Burger, NP  Please contact Palliative Medicine Team phone at [phone] for questions and concerns.

## 2023-02-21 NOTE — Progress Notes (Signed)
Occupational Therapy Treatment Patient Details Name: [name] MRN: [id number] DOB: 12/02/1945 Today's Date: 02/21/2023   History of present illness Pt is a 78yo female who was found down by daughter and brought to ED where she was noted to have pinpoint pupils, L hemiparesis, and right gaze deviation. She was intubated for airway protection. 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 MRI brain revealed no stroke. 2/10 - one way extubation PMH: Acute CHF, HLD, HTN,  Prosthetic valve dysfunction (07/22/2014), S/P MVR (mitral valve replacement) (04/11/1998), S/P redo mitral valve replacement with bioprosthetic valve (07/25/2014), Stroke, Thrombosis of prosthetic heart valve (07/25/2014), and Valvular endocarditis (2000)   OT comments  Patient did not arouse, opened eyes barely and only seeing the white of her eyes.  Patient did not follow any commands.  Provided BUE PROM and addressed position of arms and legs. Significant decrease in functional ability from OT initial eval on 02/18/23. Patient will benefit from continued inpatient follow up therapy, <3 hours/day.  Will follow for potential positioning needs with BUE      If plan is discharge home, recommend the following:  Two people to help with bathing/dressing/bathroom;Two people to help with walking and/or transfers;Direct supervision/assist for medications management;Direct supervision/assist for financial management;Assist for transportation   Equipment Recommendations       Recommendations for Other Services      Precautions / Restrictions Precautions Precautions: Fall Restrictions Weight Bearing Restrictions Per Provider Order: No       Mobility Bed Mobility Overal bed mobility: Needs Assistance             General bed mobility comments: Total assist today    Transfers Overall transfer level: Needs assistance                  General transfer comment: not alert enought to attempt     Balance                                           ADL either performed or assessed with clinical judgement   ADL Overall ADL's : Needs assistance/impaired Eating/Feeding: Total assistance   Grooming: Total assistance   Upper Body Bathing: Total assistance   Lower Body Bathing: Total assistance;Bed level   Upper Body Dressing : Total assistance   Lower Body Dressing: Total assistance;Bed level                      Extremity/Trunk Assessment Upper Extremity Assessment Upper Extremity Assessment: RUE deficits/detail;LUE deficits/detail RUE Deficits / Details: Resistance to range , but with time able to move through range R shoulder, elbow, wrist and hand LUE Deficits / Details: Resistance to range, patients with nails digging into palm. Able to fully range elbow, wris and hand, unable to range L shoulder due to resistance/ tone.  Unable to determine.   Lower Extremity Assessment Lower Extremity Assessment: RLE deficits/detail;LLE deficits/detail RLE Deficits / Details: BLE flexed and knees rubbing - placed pillow between knees / ankles LLE Deficits / Details: See RLE        Vision       Perception     Praxis     Communication Communication Communication: Other (comment) (non verbal today)   Cognition Arousal: Stuporous   Cognition: History of cognitive impairments  Following commands: Impaired Following commands impaired:  (patient unable to follow commands today)      Cueing   Cueing Techniques: Verbal cues, Tactile cues  Exercises      Shoulder Instructions       General Comments      Pertinent Vitals/ Pain       Pain Assessment Pain Assessment: Faces Faces Pain Scale: No hurt  Home Living                                          Prior Functioning/Environment              Frequency  Min  1X/week        Progress Toward Goals  OT Goals(current goals can now be found in the care plan section)  Progress towards OT goals: Not progressing toward goals - comment (Patient with change in arousal level.  Significant issues nutrition and refusing meds)  Acute Rehab OT Goals OT Goal Formulation: Patient unable to participate in goal setting Time For Goal Achievement: 03/04/23 Potential to Achieve Goals: Poor ADL Goals Pt Will Perform Grooming: with min assist;sitting;bed level Pt Will Perform Upper Body Dressing: with mod assist;sitting;bed level Pt Will Perform Lower Body Dressing: with mod assist;sitting/lateral leans;sit to/from stand  Plan      Co-evaluation                 AM-PAC OT "6 Clicks" Daily Activity     Outcome Measure   Help from another person eating meals?: Total Help from another person taking care of personal grooming?: Total Help from another person toileting, which includes using toliet, bedpan, or urinal?: Total Help from another person bathing (including washing, rinsing, drying)?: Total Help from another person to put on and taking off regular upper body clothing?: Total Help from another person to put on and taking off regular lower body clothing?: Total 6 Click Score: 6    End of Session    OT Visit Diagnosis: Muscle weakness (generalized) (M62.81);Other symptoms and signs involving the nervous system (R29.898);Other symptoms and signs involving cognitive function   Activity Tolerance Patient limited by lethargy   Patient Left in bed;with call bell/phone within reach;with bed alarm set   Nurse Communication Mobility status (arousal level)        Time: 1610-9604 OT Time Calculation (min): 25 min  Charges: OT General Charges $OT Visit: 1 Visit OT Treatments $Therapeutic Exercise: 23-37 mins  Hal Neer OTR/L   Malachi Bonds 02/21/2023, 11:09 AM

## 2023-02-21 NOTE — Progress Notes (Signed)
Nutrition Follow-up  DOCUMENTATION CODES:  Not applicable  INTERVENTION:  Once Cortrak placed and xray confirms appropriate position, recommend: Starting Osmolite 1.2 at 19ml/hr and advancing by 10ml q8h to goal rate of 65ml/h TF at goal provides: 1584 kcal, 73g protein and free water daily  Monitor magnesium and phosphorus every 12 hours x 4 occurrences, MD to replete as needed, as pt is at risk for refeeding syndrome   Thiamine 100mg  x5 days  Continue PO diet per SLP recommendations; currently on dysphagia 1/thin liquids  Consider scheduled bowel regimen given last documented BM x4 days ago  NUTRITION DIAGNOSIS:  Inadequate oral intake related to acute illness as evidenced by NPO status. - diet advanced, inadequate PO intake remains ongoing  GOAL:   Patient will meet greater than or equal to 90% of their needs - goal unmet, addressing via TF initiation  MONITOR:   PO intake, Weight trends, Labs, Supplement acceptance  REASON FOR ASSESSMENT:   Consult Enteral/tube feeding initiation and management (Trickle start)  ASSESSMENT:   78 yo female admitted after being found unresponsive by daughter, bradycardic and hypotensive but did not lose pulse, intubated for airway protection, possible seizure.  PMH includes HTN, HLD, stroke, mitral stenosis requiring MVR, CKD 3, vascular dementia  1/31 CT head with R SDH,  MRI: no evidence of acute infarct, right SDH-no mass effect, no midline shift 2/02 TF initiated 2/10 One way extubation, TF discontinued 2/11 MBS by SLP with diet advancement to Dysphagia 1, Thins 2/12 Mental status waxes and wanes, MD discussed possible Cortrak placement for meds and nutrition with pt's family-decision pending   PMT continues to follow with patient, her son and daughter for ongoing GOC. Conversation reflects discussion regarding Cortrak and that this would be a way to deliver her calories and hydration but this is a temporary measures and is  not reversing her problems with dementia and clearing her lungs.  Plans to trial temporary supplemental enteral nutrition support via Cortrak.   Meal completions: 2/11: 10% dinner 2/13: 50% lunch  Admit weight: 60 kg Current weight: 68.1 kg   Medications: solu-medrol, IV protonix, D10 @ 24ml/hr  Labs:  BUN 35 Cr 1.56 GFR 34 CBG's  CBG's 74-140 x24 hours  Diet Order:   Diet Order             DIET - DYS 1 Room service appropriate? Yes with Assist; Fluid consistency: Thin  Diet effective now                   EDUCATION NEEDS:   Not appropriate for education at this time  Skin:  Skin Assessment: Skin Integrity Issues: Skin Integrity Issues:: Unstageable, Other (Comment) Unstageable: lip Other: skin tear, MASD to perineum  Last BM:  2/10  Height:   Ht Readings from Last 1 Encounters:  02/09/23 5\' 4"  (1.626 m)    Weight:   Wt Readings from Last 1 Encounters:  02/20/23 68.1 kg   BMI:  Body mass index is 25.77 kg/m.  Estimated Nutritional Needs:   Kcal:  1450-1650 kcals  Protein:  70-85 g  Fluid:  >/= 1.5 L  Drusilla Kanner, RDN, LDN Clinical Nutrition

## 2023-02-21 NOTE — Procedures (Signed)
Cortrak  Tube Type:  Cortrak - 43 inches Tube Location:  Right nare Initial Placement:  Stomach Secured by: Bridle Technique Used to Measure Tube Placement:  Marking at nare/corner of mouth Cortrak Secured At:  65 cm   Cortrak Tube Team Note:  Consult received to place a Cortrak feeding tube.   No x-ray is required. RN may begin using tube.   If the tube becomes dislodged please keep the tube and contact the Cortrak team at [email or web address] for replacement.  If after hours and replacement cannot be delayed, place a NG tube and confirm placement with an abdominal x-ray.    Betsey Holiday MS, RD, LDN If unable to be reached, please send secure chat to "RD inpatient" available from 8:00a-4:00p daily

## 2023-02-22 DIAGNOSIS — Z7189 Other specified counseling: Secondary | ICD-10-CM | POA: Diagnosis not present

## 2023-02-22 DIAGNOSIS — Z515 Encounter for palliative care: Secondary | ICD-10-CM | POA: Diagnosis not present

## 2023-02-22 DIAGNOSIS — G9340 Encephalopathy, unspecified: Secondary | ICD-10-CM | POA: Diagnosis not present

## 2023-02-22 LAB — CBC WITH DIFFERENTIAL/PLATELET
Abs Immature Granulocytes: 0.07 10*3/uL (ref 0.00–0.07)
Basophils Absolute: 0 10*3/uL (ref 0.0–0.1)
Basophils Relative: 0 %
Eosinophils Absolute: 0 10*3/uL (ref 0.0–0.5)
Eosinophils Relative: 1 %
HCT: 28 % — ABNORMAL LOW (ref 36.0–46.0)
Hemoglobin: 8.5 g/dL — ABNORMAL LOW (ref 12.0–15.0)
Immature Granulocytes: 2 %
Lymphocytes Relative: 19 %
Lymphs Abs: 0.8 10*3/uL (ref 0.7–4.0)
MCH: 23.9 pg — ABNORMAL LOW (ref 26.0–34.0)
MCHC: 30.4 g/dL (ref 30.0–36.0)
MCV: 78.9 fL — ABNORMAL LOW (ref 80.0–100.0)
Monocytes Absolute: 0.7 10*3/uL (ref 0.1–1.0)
Monocytes Relative: 15 %
Neutro Abs: 2.8 10*3/uL (ref 1.7–7.7)
Neutrophils Relative %: 63 %
Platelets: 187 10*3/uL (ref 150–400)
RBC: 3.55 MIL/uL — ABNORMAL LOW (ref 3.87–5.11)
RDW: 21.8 % — ABNORMAL HIGH (ref 11.5–15.5)
WBC: 4.4 10*3/uL (ref 4.0–10.5)
nRBC: 0.5 % — ABNORMAL HIGH (ref 0.0–0.2)

## 2023-02-22 LAB — BASIC METABOLIC PANEL
Anion gap: 9 (ref 5–15)
BUN: 31 mg/dL — ABNORMAL HIGH (ref 8–23)
CO2: 28 mmol/L (ref 22–32)
Calcium: 8.6 mg/dL — ABNORMAL LOW (ref 8.9–10.3)
Chloride: 100 mmol/L (ref 98–111)
Creatinine, Ser: 1.33 mg/dL — ABNORMAL HIGH (ref 0.44–1.00)
GFR, Estimated: 42 mL/min — ABNORMAL LOW (ref >60)
Glucose, Bld: 106 mg/dL — ABNORMAL HIGH (ref 70–99)
Potassium: 4.4 mmol/L (ref 3.5–5.1)
Sodium: 137 mmol/L (ref 135–145)

## 2023-02-22 LAB — GLUCOSE, CAPILLARY
Glucose-Capillary: 88 mg/dL (ref 70–99)
Glucose-Capillary: 114 mg/dL — ABNORMAL HIGH (ref 70–99)
Glucose-Capillary: 119 mg/dL — ABNORMAL HIGH (ref 70–99)
Glucose-Capillary: 174 mg/dL — ABNORMAL HIGH (ref 70–99)
Glucose-Capillary: 141 mg/dL — ABNORMAL HIGH (ref 70–99)

## 2023-02-22 LAB — PHOSPHORUS
Phosphorus: 2.2 mg/dL — ABNORMAL LOW (ref 2.5–4.6)
Phosphorus: 2.1 mg/dL — ABNORMAL LOW (ref 2.5–4.6)

## 2023-02-22 LAB — MAGNESIUM
Magnesium: 1.8 mg/dL (ref 1.7–2.4)
Magnesium: 1.8 mg/dL (ref 1.7–2.4)

## 2023-02-22 MED ORDER — POTASSIUM & SODIUM PHOSPHATES 280-160-250 MG PO PACK
1.0000 | PACK | Freq: Three times a day (TID) | ORAL | Status: DC
  Administered 2023-02-22: 1 via ORAL
  Filled 2023-02-22 (×3): qty 1

## 2023-02-22 NOTE — Progress Notes (Signed)
  Progress Note   Patient: [name] WUJ:[id number] DOB: 09-28-1945 DOA: 02/07/2023     15 DOS: the patient was seen and examined on 02/22/2023   Brief hospital course: Patient was brought into the hospital after patient was found unresponsive by her daughter.  Patient was bradycardic, hypotensive on initial evaluation, pinpoint pupils, facial droop.  No response to Narcan Intubated in the ED for airway protection.  Workup done in the hospital revealed subdural hematoma, RSV infection, waxing and waning of mental status in the setting of vascular dementia.  02/21/2023: No new changes. 02/22/2023: Patient stable.  Replete phosphorus.  Phosphorus of 2.2.  Seen alongside patient's son.  Also updated patient's daughter, [name].  Communicated with palliative care team, will need a family meeting arranged (all can be on the same page).  Assessment and Plan: Acute respiratory failure with hypoxia due to RSV infection Wheezing due to RSV   - Pulmicort/brovana bid - Yupelri 175 mcg neb daily  - Duoneb q4 hr PRN   - IV solumedrol 40 mg daily  02/22/2023: Continue supportive care.   Acute encephalopathy with baseline vascular dementia New-onset seizures SDH - IV keppra 500 mg bid  - IV valproate 325 mg q8hr  - Aricept 10 mg PO at bedtime  - Lipitor 40 mg PO daily  02/21/2023: Monitor and manage expectantly.   Hypotension - Resolving    Chronic HFrEF History of MVR, redo bioprosthetic AVR with paroxysmal Afib - Monitor    CKD stage 3b; - Monitor/stable    Mild thrombocytopenia: -Resolved. -Platelet count of 193 today.  Chronic anemia - Monitor    H/o GERD - IV protonix 40 mg daily    Hypoglycemia - IV D10 @ 20 cc/hr  -Cortrack to 2/14 To 1425: Blood sugar seems more stable today.   Moderate protein energy malnutrition - Cortrack to 2/14 and will start supplemental tube feeds afterward   Deconditioning - PT/OT/SLP as able  Hypophosphatemia: -Phosphorus of  2.2. -Replete.  Subjective:  -No history from patient.  Physical Exam: Vitals:   02/22/23 1527 02/22/23 1530 02/22/23 1600 02/22/23 1648  BP: 91/62 100/61 133/85 (!) 146/93  Pulse: 64   81  Resp: 12   19  Temp: 97.6 F (36.4 C)     TempSrc: Axillary     SpO2: 100% 97%  100%  Weight:      Height:       Physical Exam Constitutional:      Comments: Nonverbal   HENT:     Head: Normocephalic.  Cardiovascular:     Rate and Rhythm: Normal rate.  Pulmonary:     Effort: Pulmonary effort is normal.  Abdominal:     Palpations: Abdomen is soft.  Musculoskeletal:     Cervical back: Neck supple.  Skin:    General: Skin is warm.  Neurological:     Comments: Unable to evaluate   Psychiatric:     Comments: Unable to evaluate       Disposition: Status is: Inpatient Remains inpatient appropriate because: Cortrack placement 2/14  Planned Discharge Destination:  Dispo per pt's clinical progress    Time spent: 35 minutes  Author: Barnetta Chapel, MD 02/22/2023 6:35 PM  For on call review [email or web address].

## 2023-02-22 NOTE — Progress Notes (Signed)
Daily Progress Note   Patient Name: [name]       Date: 02/22/2023 DOB: July 15, 1945  Age: 78 y.o. MRN#: [id number] Attending Physician: Barnetta Chapel, MD Primary Care Physician: Myrlene Broker, MD Admit Date: 02/07/2023  Reason for Consultation/Follow-up: Establishing goals of care  Length of Stay: 15  Current Medications: Scheduled Meds:   allopurinol  100 mg Oral Daily   arformoterol  15 mcg Nebulization BID   atorvastatin  40 mg Oral Daily   budesonide (PULMICORT) nebulizer solution  0.5 mg Nebulization BID   Chlorhexidine Gluconate Cloth  6 each Topical Daily   donepezil  10 mg Oral QHS   heparin injection (subcutaneous)  5,000 Units Subcutaneous Q8H   mouth rinse  15 mL Mouth Rinse 4 times per day   pantoprazole (PROTONIX) IV  40 mg Intravenous Q24H   potassium & sodium phosphates  1 packet Oral TID WC & HS   revefenacin  175 mcg Nebulization Daily   thiamine  100 mg Oral Daily    Continuous Infusions:  dextrose 20 mL/hr at 02/22/23 1503   feeding supplement (OSMOLITE 1.2 CAL) 45 mL/hr at 02/22/23 1800   levETIRAcetam Stopped (02/22/23 0910)   valproate sodium 325 mg (02/22/23 1526)    PRN Meds: docusate, Gerhardt's butt cream, glycopyrrolate, ipratropium-albuterol, mouth rinse, polyethylene glycol  Physical Exam Vitals reviewed.  Constitutional:      General: She is sleeping.     Appearance: She is ill-appearing.  Cardiovascular:     Rate and Rhythm: Normal rate.  Pulmonary:     Effort: Pulmonary effort is normal.             Vital Signs: BP (!) 146/93   Pulse 81   Temp 97.6 F (36.4 C) (Axillary)   Resp 19   Ht 5\' 4"  (1.626 m)   Wt 66.3 kg   LMP  (LMP Unknown)   SpO2 100%   BMI 25.09 kg/m  SpO2: SpO2: 100 % O2 Device: O2  Device: Nasal Cannula O2 Flow Rate: O2 Flow Rate (L/min): 1 L/min     Patient Active Problem List   Diagnosis Date Noted   Dysphagia 02/20/2023   Acute encephalopathy 02/19/2023   AKI (acute kidney injury) (HCC) 02/19/2023   Vascular dementia (HCC) 02/15/2023   DNR (do not resuscitate)  02/15/2023   Goals of care, counseling/discussion 02/15/2023   On mechanically assisted ventilation (HCC) 02/15/2023   RSV (acute bronchiolitis due to respiratory syncytial virus) 02/15/2023   RSV infection 02/13/2023   Warfarin toxicity, accidental or unintentional, initial encounter 02/11/2023   RSV (respiratory syncytial virus pneumonia) 02/10/2023   Altered mental status 02/10/2023   Subdural hematoma (HCC) 02/09/2023   Hypotension 02/09/2023   Acute respiratory failure (HCC) 02/09/2023   Encephalopathy acute 02/07/2023   Submandibular lymphadenopathy 01/20/2023   Encounter for general adult medical examination with abnormal findings 10/27/2020   Neck pain 07/05/2017   Seasonal allergic rhinitis 10/04/2016   Chronic gout 08/03/2016   Vascular dementia, uncomplicated (HCC) 05/17/2016   Chest congestion 05/06/2016   S/P mitral valve replacement with bioprosthetic valve 04/25/2015   Essential hypertension 04/25/2015   Chronic anticoagulation 04/25/2015   Intracranial vascular stenosis    Hyperlipidemia    Chronic systolic CHF (congestive heart failure) (HCC) 01/04/2015   CKD (chronic kidney disease) stage 3, GFR 30-59 ml/min (HCC) 01/04/2015   Atrial fibrillation (HCC) 09/22/2014   RBBB 03/11/2014    Palliative Care Assessment & Plan   Patient Profile: 78 y.o. female  with past medical history of mitral valve replacement, a-fib on warfarin, endocarditis, hypertension, and vascular dementia  admitted on 02/07/2023 after being found unresponsive by her daughter.    On arrival to the ED, she was noted to have pinpoint pupils, left hemiparesis and right gaze deviation. She was intubated for  airway protection. 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 MRI brain revealed no stroke. Patient was given Ativan and loaded with Keppra. Neurosurgery felt that the small size of the subdural hematoma did not warrant surgical intervention. 1/31 intubated for airway protection. 2/3 +RSV. PCCM with ongoing GOC conversations and plans for one way extubation 2/10.   Today's Discussion: Patient was lying in bed asleep in NAD. No family at bedside.  1545: Spoke with patient's daughter [name] by phone. She called for an update and to clarify information given to her brother. I shared that I was not part of the discussion with her brother- she asked if I could ask the MD to reach out to her to clarify what was discussed-- messaged provider.  [name] shared that she visited the patient early this morning. She was able to feed her mother part of her breakfast. She said her mother recognized her. She shares that her mother responds to her family better than she does to healthcare staff. The plan remains seeing how the patient progresses with therapies and her diet. Emotional support provided.  Encouraged family to call PMT with questions or concerns.  1700: Received message from Dr. Dartha Lodge. He would like to have a family meeting so everybody can be on the same page. Told him I would reach out to Kaser tomorrow to set up.   Recommendations/Plan: DNR Limited scope of treatment Time for outcomes PMT to support   Code Status:    Code Status Orders  (From admission, onward)           Start     Ordered   02/15/23 1305  Do not attempt resuscitation (DNR)- Limited -Do Not Intubate (DNI)  (Code Status)  Continuous       Question Answer Comment  If pulseless and not breathing No CPR or chest compressions.   In Pre-Arrest Conditions (Patient Is Breathing and Has A Pulse) Do not intubate.  Provide all  appropriate non-invasive medical interventions. Avoid ICU transfer unless indicated or required.   Consent: Discussion documented in EHR or advanced directives reviewed      02/15/23 1304            Extensive chart review has been completed prior to seeing the patient including labs, vital signs, imaging, progress/consult notes, orders, medications, and available advance directive documents.  Time spent: 25 minutes  Care plan was discussed with Dr. Dartha Lodge  Thank you for allowing the Palliative Medicine Team to assist in the care of this patient.    Sherryll Burger, NP  Please contact Palliative Medicine Team phone at [phone] for questions and concerns.

## 2023-02-22 NOTE — Plan of Care (Signed)
  Problem: Clinical Measurements: Goal: Ability to maintain clinical measurements within normal limits will improve Outcome: Progressing Goal: Will remain free from infection Outcome: Progressing Goal: Diagnostic test results will improve Outcome: Progressing Goal: Respiratory complications will improve Outcome: Progressing Goal: Cardiovascular complication will be avoided Outcome: Progressing   Problem: Elimination: Goal: Will not experience complications related to bowel motility Outcome: Progressing Goal: Will not experience complications related to urinary retention Outcome: Progressing   Problem: Pain Managment: Goal: General experience of comfort will improve and/or be controlled Outcome: Progressing   Problem: Safety: Goal: Ability to remain free from injury will improve Outcome: Progressing   Problem: Skin Integrity: Goal: Risk for impaired skin integrity will decrease Outcome: Progressing

## 2023-02-22 NOTE — Plan of Care (Signed)

## 2023-02-23 DIAGNOSIS — Z7189 Other specified counseling: Secondary | ICD-10-CM | POA: Diagnosis not present

## 2023-02-23 DIAGNOSIS — G9340 Encephalopathy, unspecified: Secondary | ICD-10-CM | POA: Diagnosis not present

## 2023-02-23 DIAGNOSIS — Z515 Encounter for palliative care: Secondary | ICD-10-CM | POA: Diagnosis not present

## 2023-02-23 LAB — MAGNESIUM
Magnesium: 1.7 mg/dL (ref 1.7–2.4)
Magnesium: 2 mg/dL (ref 1.7–2.4)

## 2023-02-23 LAB — GLUCOSE, CAPILLARY
Glucose-Capillary: 101 mg/dL — ABNORMAL HIGH (ref 70–99)
Glucose-Capillary: 106 mg/dL — ABNORMAL HIGH (ref 70–99)
Glucose-Capillary: 112 mg/dL — ABNORMAL HIGH (ref 70–99)
Glucose-Capillary: 131 mg/dL — ABNORMAL HIGH (ref 70–99)
Glucose-Capillary: 139 mg/dL — ABNORMAL HIGH (ref 70–99)
Glucose-Capillary: 140 mg/dL — ABNORMAL HIGH (ref 70–99)
Glucose-Capillary: 157 mg/dL — ABNORMAL HIGH (ref 70–99)

## 2023-02-23 LAB — BASIC METABOLIC PANEL
Anion gap: 10 (ref 5–15)
BUN: 25 mg/dL — ABNORMAL HIGH (ref 8–23)
CO2: 30 mmol/L (ref 22–32)
Calcium: 8.8 mg/dL — ABNORMAL LOW (ref 8.9–10.3)
Chloride: 98 mmol/L (ref 98–111)
Creatinine, Ser: 1.15 mg/dL — ABNORMAL HIGH (ref 0.44–1.00)
GFR, Estimated: 50 mL/min — ABNORMAL LOW (ref >60)
Glucose, Bld: 129 mg/dL — ABNORMAL HIGH (ref 70–99)
Potassium: 4.7 mmol/L (ref 3.5–5.1)
Sodium: 138 mmol/L (ref 135–145)

## 2023-02-23 LAB — PHOSPHORUS
Phosphorus: 1.5 mg/dL — ABNORMAL LOW (ref 2.5–4.6)
Phosphorus: 2 mg/dL — ABNORMAL LOW (ref 2.5–4.6)

## 2023-02-23 MED ORDER — LEVETIRACETAM 500 MG PO TABS
500.0000 mg | ORAL_TABLET | Freq: Two times a day (BID) | ORAL | Status: DC
  Administered 2023-02-23 – 2023-03-03 (×18): 500 mg
  Filled 2023-02-23 (×18): qty 1

## 2023-02-23 MED ORDER — ALLOPURINOL 100 MG PO TABS
100.0000 mg | ORAL_TABLET | Freq: Every day | ORAL | Status: DC
  Administered 2023-02-23 – 2023-03-03 (×9): 100 mg
  Filled 2023-02-23 (×9): qty 1

## 2023-02-23 MED ORDER — THIAMINE MONONITRATE 100 MG PO TABS
100.0000 mg | ORAL_TABLET | Freq: Every day | ORAL | Status: AC
  Administered 2023-02-23 – 2023-02-27 (×5): 100 mg
  Filled 2023-02-23 (×5): qty 1

## 2023-02-23 MED ORDER — VALPROIC ACID 250 MG/5ML PO SOLN
325.0000 mg | Freq: Three times a day (TID) | ORAL | Status: DC
  Administered 2023-02-23 – 2023-03-03 (×27): 325 mg
  Filled 2023-02-23 (×27): qty 10

## 2023-02-23 MED ORDER — SODIUM PHOSPHATES 45 MMOLE/15ML IV SOLN
45.0000 mmol | Freq: Once | INTRAVENOUS | Status: AC
  Administered 2023-02-23: 45 mmol via INTRAVENOUS
  Filled 2023-02-23: qty 15

## 2023-02-23 MED ORDER — DONEPEZIL HCL 5 MG PO TABS
10.0000 mg | ORAL_TABLET | Freq: Every day | ORAL | Status: DC
  Administered 2023-02-23 – 2023-03-03 (×9): 10 mg
  Filled 2023-02-23: qty 2
  Filled 2023-02-23: qty 1
  Filled 2023-02-23 (×3): qty 2
  Filled 2023-02-23 (×2): qty 1
  Filled 2023-02-23 (×2): qty 2

## 2023-02-23 MED ORDER — DOCUSATE SODIUM 50 MG/5ML PO LIQD: 100.0000 mg | Freq: Two times a day (BID) | ORAL | Status: DC | PRN

## 2023-02-23 MED ORDER — MAGNESIUM SULFATE 2 GM/50ML IV SOLN
2.0000 g | Freq: Once | INTRAVENOUS | Status: AC
  Administered 2023-02-23: 2 g via INTRAVENOUS
  Filled 2023-02-23: qty 50

## 2023-02-23 MED ORDER — ATORVASTATIN CALCIUM 40 MG PO TABS
40.0000 mg | ORAL_TABLET | Freq: Every day | ORAL | Status: DC
  Administered 2023-02-23 – 2023-03-03 (×9): 40 mg
  Filled 2023-02-23 (×9): qty 1

## 2023-02-23 NOTE — Progress Notes (Signed)
Progress Note   Patient: [name] ZOX:[id number] DOB: [birth year] DOA: 02/07/2023     16 DOS: the patient was seen and examined on 02/23/2023   Brief hospital course: Patient was brought into the hospital after patient was found unresponsive by her daughter.  Patient was bradycardic, hypotensive on initial evaluation, pinpoint pupils, facial droop.  No response to Narcan Intubated in the ED for airway protection.  Workup done in the hospital revealed subdural hematoma, RSV infection, waxing and waning of mental status in the setting of vascular dementia.  02/21/2023: No new changes. 02/22/2023: Patient stable.  Replete phosphorus.  Phosphorus of 2.2.  Seen alongside patient's son.  Also updated patient's daughter, [name].  Communicated with palliative care team, will need a family meeting arranged (all can be on the same page). 02/23/2023: Patient seen.  No new changes.  Goal of care will need to be addressed.  Family meeting would have been ideal.  PEG tube option needs to be addressed.  Palliative care input is appreciated.  Assessment and Plan: Acute respiratory failure with hypoxia due to RSV infection Wheezing due to RSV   - Pulmicort/brovana bid - Yupelri 175 mcg neb daily  - Duoneb q4 hr PRN   - IV solumedrol 40 mg daily  02/22/2023: Continue supportive care. 02/23/2023.  Stable.   Acute encephalopathy with baseline vascular dementia New-onset seizures SDH - IV keppra 500 mg bid  - IV valproate 325 mg q8hr  - Aricept 10 mg PO at bedtime  - Lipitor 40 mg PO daily  02/21/2023: Monitor and manage expectantly. 02/23/2023: Query back to baseline.   Hypotension - Resolved.    Chronic HFrEF History of MVR, redo bioprosthetic AVR with paroxysmal Afib - Monitor    CKD stage 3b versus CKD 3a: -Renal function continues to improve. -Serum creatinine of 1.15 today, with EGFR of 50 mL/min per 1.73 m.   Mild thrombocytopenia: -Resolved. -Platelet count of 187  today.  Chronic anemia - Monitor  -Hemoglobin of 8.5 g/dL today.   H/o GERD - IV protonix 40 mg daily    Hypoglycemia -Resolved.  Hypophosphatemia: -Phosphorus of 1.5. -IV sodium phosphate 45 mmol x 1 dose.  Hypomagnesemia: -Magnesium of 1.7. -IV magnesium 2 g x 1 dose.   Moderate protein energy malnutrition - Cortrack to 2/14 and will start supplemental tube feeds afterward   Deconditioning - PT/OT/SLP as able  Subjective:  -No history from patient.  Physical Exam: Vitals:   02/23/23 0500 02/23/23 0813 02/23/23 0828 02/23/23 1245  BP:  92/78  94/64  Pulse:  80  82  Resp:  18  18  Temp:  98.4 F (36.9 C)  98.6 F (37 C)  TempSrc:  Axillary  Axillary  SpO2:  99% 96% 99%  Weight: 66.3 kg     Height:       Physical Exam Constitutional:      Comments: Nonverbal   HENT:     Head: Normocephalic.  Cardiovascular:     Rate and Rhythm: Normal rate.  Pulmonary:     Effort: Pulmonary effort is normal.  Abdominal:     Palpations: Abdomen is soft.  Musculoskeletal:     Cervical back: Neck supple.  Skin:    General: Skin is warm.  Neurological:     Comments: Unable to evaluate   Psychiatric:     Comments: Unable to evaluate       Disposition: Status is: Inpatient Remains inpatient appropriate because: Cortrack placement 2/14  Planned Discharge Destination:  Dispo per pt's clinical progress    Time spent: 35 minutes  Author: Barnetta Chapel, MD 02/23/2023 3:42 PM  For on call review [email or web address].

## 2023-02-23 NOTE — Plan of Care (Signed)
  Problem: Clinical Measurements: Goal: Ability to maintain clinical measurements within normal limits will improve Outcome: Progressing Goal: Will remain free from infection Outcome: Progressing Goal: Diagnostic test results will improve Outcome: Progressing Goal: Respiratory complications will improve Outcome: Progressing Goal: Cardiovascular complication will be avoided Outcome: Progressing   Problem: Coping: Goal: Level of anxiety will decrease Outcome: Progressing   Problem: Elimination: Goal: Will not experience complications related to bowel motility Outcome: Progressing Goal: Will not experience complications related to urinary retention Outcome: Progressing   Problem: Pain Managment: Goal: General experience of comfort will improve and/or be controlled Outcome: Progressing   Problem: Safety: Goal: Ability to remain free from injury will improve Outcome: Progressing   Problem: Skin Integrity: Goal: Risk for impaired skin integrity will decrease Outcome: Progressing

## 2023-02-23 NOTE — Progress Notes (Signed)
Daily Progress Note   Patient Name: [name]       Date: 02/23/2023 DOB: 1945/11/22  Age: 78 y.o. MRN#: [id number] Attending Physician: Barnetta Chapel, MD Primary Care Physician: Myrlene Broker, MD Admit Date: 02/07/2023  Reason for Consultation/Follow-up: Establishing goals of care  Length of Stay: 16  Current Medications: Scheduled Meds:   allopurinol  100 mg Per Tube Daily   arformoterol  15 mcg Nebulization BID   atorvastatin  40 mg Per Tube Daily   budesonide (PULMICORT) nebulizer solution  0.5 mg Nebulization BID   donepezil  10 mg Per Tube QHS   heparin injection (subcutaneous)  5,000 Units Subcutaneous Q8H   levETIRAcetam  500 mg Per Tube BID   mouth rinse  15 mL Mouth Rinse 4 times per day   pantoprazole (PROTONIX) IV  40 mg Intravenous Q24H   revefenacin  175 mcg Nebulization Daily   thiamine  100 mg Per Tube Daily   valproic acid  325 mg Per Tube Q8H    Continuous Infusions:  dextrose 20 mL/hr at 02/23/23 1116   feeding supplement (OSMOLITE 1.2 CAL) 55 mL/hr at 02/23/23 1057   sodium PHOSPHATE IVPB (in mmol)      PRN Meds: docusate, Gerhardt's butt cream, glycopyrrolate, ipratropium-albuterol, mouth rinse, polyethylene glycol  Physical Exam Vitals reviewed.  Constitutional:      General: She is sleeping. She is not in acute distress.    Appearance: She is ill-appearing.     Comments: cortrak  Cardiovascular:     Rate and Rhythm: Normal rate.  Pulmonary:     Effort: Pulmonary effort is normal.             Vital Signs: BP 94/64 (BP Location: Left Wrist)   Pulse 82   Temp 98.6 F (37 C) (Axillary)   Resp 18   Ht 5\' 4"  (1.626 m)   Wt 66.3 kg   LMP  (LMP Unknown)   SpO2 99%   BMI 25.09 kg/m  SpO2: SpO2: 99 % O2 Device: O2  Device: Nasal Cannula O2 Flow Rate: O2 Flow Rate (L/min): 1 L/min     Patient Active Problem List   Diagnosis Date Noted   Dysphagia 02/20/2023   Acute encephalopathy 02/19/2023   AKI (acute kidney injury) (HCC) 02/19/2023   Vascular dementia (  HCC) 02/15/2023   DNR (do not resuscitate) 02/15/2023   Goals of care, counseling/discussion 02/15/2023   On mechanically assisted ventilation (HCC) 02/15/2023   RSV (acute bronchiolitis due to respiratory syncytial virus) 02/15/2023   RSV infection 02/13/2023   Warfarin toxicity, accidental or unintentional, initial encounter 02/11/2023   RSV (respiratory syncytial virus pneumonia) 02/10/2023   Altered mental status 02/10/2023   Subdural hematoma (HCC) 02/09/2023   Hypotension 02/09/2023   Acute respiratory failure (HCC) 02/09/2023   Encephalopathy acute 02/07/2023   Submandibular lymphadenopathy 01/20/2023   Encounter for general adult medical examination with abnormal findings 10/27/2020   Neck pain 07/05/2017   Seasonal allergic rhinitis 10/04/2016   Chronic gout 08/03/2016   Vascular dementia, uncomplicated (HCC) 05/17/2016   Chest congestion 05/06/2016   S/P mitral valve replacement with bioprosthetic valve 04/25/2015   Essential hypertension 04/25/2015   Chronic anticoagulation 04/25/2015   Intracranial vascular stenosis    Hyperlipidemia    Chronic systolic CHF (congestive heart failure) (HCC) 01/04/2015   CKD (chronic kidney disease) stage 3, GFR 30-59 ml/min (HCC) 01/04/2015   Atrial fibrillation (HCC) 09/22/2014   RBBB 03/11/2014    Palliative Care Assessment & Plan   Patient Profile: 78 y.o. female  with past medical history of mitral valve replacement, a-fib on warfarin, endocarditis, hypertension, and vascular dementia  admitted on 02/07/2023 after being found unresponsive by her daughter.    On arrival to the ED, she was noted to have pinpoint pupils, left hemiparesis and right gaze deviation. She was intubated for  airway protection. 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 MRI brain revealed no stroke. Patient was given Ativan and loaded with Keppra. Neurosurgery felt that the small size of the subdural hematoma did not warrant surgical intervention. 1/31 intubated for airway protection. 2/3 +RSV. PCCM with ongoing GOC conversations and plans for one way extubation 2/10.   Today's Discussion: Patient was lying in bed asleep in NAD. No family at bedside.  11:55: Spoke with patient's daughter [name] by phone. Shared with her the update I received from nursing. The patient has not been eating for nursing staff, but when family comes she is eating most of her meals. This morning her son came to feed her and she ate 75-90%.  [name] shared that the family's goal is to get the patient to a nursing facility when she is stable for discharge. They understand the patient eats more when family feeds her, so they are trying to come in for as many meals as they can. They understand that she may not eat as much from staff at the facility at discharge but "won't know that for sure until she gets there." They want her to continue working with therapies as she can. [name] feels everybody is on the same page after yesterday and does not feel a family meeting is needed at this time. I reached out to SW to let them know she wanted a call when they had time.  Emotional support provided.  Encouraged family to call PMT with questions or concerns.   Recommendations/Plan: DNR Limited scope of treatment Paged SW re: [name] would like a call when they have time Discharge to nursing facility when stable PMT to support   Code Status:    Code Status Orders  (From admission, onward)           Start     Ordered   02/15/23 1305  Do  not attempt resuscitation (DNR)- Limited -Do Not Intubate (DNI)  (Code Status)  Continuous        Question Answer Comment  If pulseless and not breathing No CPR or chest compressions.   In Pre-Arrest Conditions (Patient Is Breathing and Has A Pulse) Do not intubate. Provide all appropriate non-invasive medical interventions. Avoid ICU transfer unless indicated or required.   Consent: Discussion documented in EHR or advanced directives reviewed      02/15/23 1304            Extensive chart review has been completed prior to seeing the patient including labs, vital signs, imaging, progress/consult notes, orders, medications, and available advance directive documents.  Time spent: 35 minutes  Care plan was discussed with Dr. Dartha Lodge, bedside RN, and SW  Thank you for allowing the Palliative Medicine Team to assist in the care of this patient.    Sherryll Burger, NP  Please contact Palliative Medicine Team phone at [phone] for questions and concerns.

## 2023-02-23 NOTE — Plan of Care (Signed)
Alert intermittently. Disoriented x4. No oral intake accepted when attempted to feed by staff members. Son [name] came this morning and fed [name] breakfast, she ate 90%.   Problem: Education: Goal: Knowledge of General Education information will improve Description: Including pain rating scale, medication(s)/side effects and non-pharmacologic comfort measures Outcome: Not Progressing   Problem: Health Behavior/Discharge Planning: Goal: Ability to manage health-related needs will improve Outcome: Not Progressing   Problem: Clinical Measurements: Goal: Ability to maintain clinical measurements within normal limits will improve Outcome: Progressing Goal: Will remain free from infection Outcome: Progressing Goal: Diagnostic test results will improve Outcome: Progressing

## 2023-02-24 DIAGNOSIS — F0150 Vascular dementia without behavioral disturbance: Secondary | ICD-10-CM | POA: Diagnosis not present

## 2023-02-24 DIAGNOSIS — G9340 Encephalopathy, unspecified: Secondary | ICD-10-CM | POA: Diagnosis not present

## 2023-02-24 DIAGNOSIS — Z515 Encounter for palliative care: Secondary | ICD-10-CM | POA: Diagnosis not present

## 2023-02-24 DIAGNOSIS — Z7189 Other specified counseling: Secondary | ICD-10-CM | POA: Diagnosis not present

## 2023-02-24 LAB — RENAL FUNCTION PANEL
Albumin: 1.9 g/dL — ABNORMAL LOW (ref 3.5–5.0)
Anion gap: 11 (ref 5–15)
BUN: 25 mg/dL — ABNORMAL HIGH (ref 8–23)
CO2: 29 mmol/L (ref 22–32)
Calcium: 8.4 mg/dL — ABNORMAL LOW (ref 8.9–10.3)
Chloride: 100 mmol/L (ref 98–111)
Creatinine, Ser: 1.04 mg/dL — ABNORMAL HIGH (ref 0.44–1.00)
GFR, Estimated: 56 mL/min — ABNORMAL LOW (ref >60)
Glucose, Bld: 122 mg/dL — ABNORMAL HIGH (ref 70–99)
Phosphorus: 2.2 mg/dL — ABNORMAL LOW (ref 2.5–4.6)
Potassium: 4.9 mmol/L (ref 3.5–5.1)
Sodium: 140 mmol/L (ref 135–145)

## 2023-02-24 LAB — GLUCOSE, CAPILLARY
Glucose-Capillary: 110 mg/dL — ABNORMAL HIGH (ref 70–99)
Glucose-Capillary: 136 mg/dL — ABNORMAL HIGH (ref 70–99)
Glucose-Capillary: 128 mg/dL — ABNORMAL HIGH (ref 70–99)
Glucose-Capillary: 156 mg/dL — ABNORMAL HIGH (ref 70–99)
Glucose-Capillary: 120 mg/dL — ABNORMAL HIGH (ref 70–99)

## 2023-02-24 LAB — MAGNESIUM: Magnesium: 1.7 mg/dL (ref 1.7–2.4)

## 2023-02-24 MED ORDER — MAGNESIUM SULFATE 2 GM/50ML IV SOLN
2.0000 g | Freq: Once | INTRAVENOUS | Status: AC
  Administered 2023-02-24: 2 g via INTRAVENOUS
  Filled 2023-02-24: qty 50

## 2023-02-24 MED ORDER — SODIUM PHOSPHATES 45 MMOLE/15ML IV SOLN
15.0000 mmol | Freq: Once | INTRAVENOUS | Status: AC
  Administered 2023-02-24: 15 mmol via INTRAVENOUS
  Filled 2023-02-24: qty 5

## 2023-02-24 NOTE — Plan of Care (Signed)
  Problem: Clinical Measurements: Goal: Ability to maintain clinical measurements within normal limits will improve Outcome: Progressing Goal: Will remain free from infection Outcome: Progressing Goal: Diagnostic test results will improve Outcome: Progressing Goal: Respiratory complications will improve Outcome: Progressing Goal: Cardiovascular complication will be avoided Outcome: Progressing   Problem: Nutrition: Goal: Adequate nutrition will be maintained Outcome: Progressing   Problem: Safety: Goal: Ability to remain free from injury will improve Outcome: Progressing   Problem: Skin Integrity: Goal: Risk for impaired skin integrity will decrease Outcome: Progressing   Problem: Elimination: Goal: Will not experience complications related to bowel motility Outcome: Progressing Goal: Will not experience complications related to urinary retention Outcome: Progressing   Problem: Pain Managment: Goal: General experience of comfort will improve and/or be controlled Outcome: Progressing

## 2023-02-24 NOTE — Plan of Care (Signed)
   Problem: Education: Goal: Knowledge of General Education information will improve Description Including pain rating scale, medication(s)/side effects and non-pharmacologic comfort measures Outcome: Progressing

## 2023-02-24 NOTE — Progress Notes (Signed)
Palliative:  HPI: 78 y.o. female  with past medical history of mitral valve replacement, a-fib on warfarin, endocarditis, hypertension, and vascular dementia  admitted on 02/07/2023 after being found unresponsive by her daughter. On arrival to the ED, she was noted to have pinpoint pupils, left hemiparesis and right gaze deviation. She was intubated for airway protection. 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 MRI brain revealed no stroke. Patient was given Ativan and loaded with Keppra. Neurosurgery felt that the small size of the subdural hematoma did not warrant surgical intervention. 1/31 intubated for airway protection. 2/3 +RSV. PCCM with ongoing GOC conversations and plans for one way extubation 2/10.   I met today at Ms. Deysi's bedside along with son [name] and SLP Marchelle Folks. We reviewed her eating and intake. Ms. Mairen accepts food from her children. All three children have attempted to be present for meals to help feed her. She does not eat well for staff but she does respond more and eat better for her family. We reviewed her improved intake and plans to proceed soon with d/c Cortrak and no need to consider PEG tube. She continues with congestion and aspiration risk - we reviewed that this risk will continue due to her underlying dementia.   [name] shares about other friends that have experienced loss due to dementia and he is able to relate some of what he heard about from his friend and what he is hearing and discussing with his mother. He discussed plans for SNF rehab. He also shares possible consideration of taking her home to care for her. I discussed with [name] that taking her home is always an option if he is willing to care for her. She will need 24/7 caregiver support. We also discussed the option of hospice support at home. If [name] is willing to oversee care for his mother I believe  home with hospice would be a good option for Ms. Tiziana. [name] will discuss with his sister [name] tonight. I reassured him that I will be around and that he or his siblings feel free to call me with any questions or concerns.   All questions/concerns addressed. Emotional support provided. Discussed with Dr. Dartha Lodge.   Exam: Awakens briefly. Falls back asleep with ease. She has inconsistent verbal response - sometimes difficult to understand. Smiles. No distress. Breathing regular, unlabored. Audible congestion - clears throat but does not expectorate. Sats remain upper 90s. Generalized weakness and fatigue.   Plan: - DNR/DNI - No need for PEG tube - eating/drinking for family; okay to d/c Cortrak soon - Family to discuss plans for SNF rehab vs home (potentially with hospice)  55 min  Yong Channel, NP Palliative Medicine Team Pager [phone] (Please see [email or web address] for schedule) Team Phone [phone]

## 2023-02-24 NOTE — Progress Notes (Signed)
Physical Therapy Treatment Patient Details Name: [name] MRN: [id number] DOB: 1945/08/14 Today's Date: 02/24/2023   History of Present Illness Pt is a 78yo female who was found down by daughter and brought to ED where she was noted to have pinpoint pupils, L hemiparesis, and right gaze deviation. She was intubated for airway protection. 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 MRI brain revealed no stroke. 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 Initially resisted attempts to perform ROM to extremities. Gestured and verbal cues for pt to come sit at EOB and pt initiated moving legs to right EOB and attempting to raise her torso and then stopped trying. Attempted to assist her to complete task and she strongly resisted assistance. Assisted back to supine position with HOB elevated due to NG tube feeds. No family present during attempt. Will plan to attempt one more session and if pt continues to resist movement, will sign-off.    If plan is discharge home, recommend the following: Help with stairs or ramp for entrance;Supervision due to cognitive status;Assist for transportation;Two people to help with walking and/or transfers;Assistance with feeding;Direct supervision/assist for medications management;Direct supervision/assist for financial management   Can travel by private vehicle     No  Equipment Recommendations  Wheelchair (measurements PT);Wheelchair cushion (measurements PT);Hoyer lift;Hospital bed    Recommendations  for Other Services       Precautions / Restrictions Precautions Precautions: Fall Restrictions Weight Bearing Restrictions Per Provider Order: No     Mobility  Bed Mobility Overal bed mobility: Needs Assistance Bed Mobility: Supine to Sit           General bed mobility comments: pt initiated moving bil LEs over EOB to her rt when asked to sit on EOB. She got 1/2 way there and trying to raise her torso and gave up. Attempted to assist pt and she strongly resisted movement.    Transfers                   General transfer comment: would not come to sit at EOB to allow attempt at transfer    Ambulation/Gait               General Gait Details: unable this date   Stairs             Wheelchair Mobility     Tilt Bed    Modified Rankin (Stroke Patients Only)       Balance                                            Communication Communication Communication: Other (comment) (non verbal today)  Cognition Arousal: Lethargic Behavior During Therapy: Flat affect   PT - Cognitive impairments: History of cognitive impairments                       PT - Cognition Comments: pt easily opens eyes to verbal and tactile stimulation; closes eyes multiple times during session  Following commands: Impaired Following commands impaired:  (patient unable to follow commands today)    Cueing Cueing Techniques: Verbal cues, Tactile cues  Exercises      General Comments General comments (skin integrity, edema, etc.): Attempted AAROM of extremities with pt actively resisting movement.      Pertinent Vitals/Pain Pain Assessment Pain Assessment: Faces Faces Pain Scale: No hurt Breathing: normal Negative Vocalization: none Facial Expression: smiling or inexpressive Body Language: relaxed Consolability: no need to console PAINAD Score: 0    Home Living                          Prior Function            PT Goals  (current goals can now be found in the care plan section) Acute Rehab PT Goals Patient Stated Goal: unable to state Time For Goal Achievement: 02/22/2023 Potential to Achieve Goals: Good Progress towards PT goals: Not progressing toward goals - comment    Frequency    Min 1X/week      PT Plan      Co-evaluation PT/OT/SLP Co-Evaluation/Treatment:  (+2 for OOB mobility)            AM-PAC PT "6 Clicks" Mobility   Outcome Measure  Help needed turning from your back to your side while in a flat bed without using bedrails?: Total Help needed moving from lying on your back to sitting on the side of a flat bed without using bedrails?: Total Help needed moving to and from a bed to a chair (including a wheelchair)?: Total Help needed standing up from a chair using your arms (e.g., wheelchair or bedside chair)?: Total Help needed to walk in hospital room?: Total Help needed climbing 3-5 steps with a railing? : Total 6 Click Score: 6    End of Session Equipment Utilized During Treatment: Oxygen Activity Tolerance: Other (comment) (Pt actively resisting) Patient left: in bed;with call bell/phone within reach;with bed alarm set   PT Visit Diagnosis: Unsteadiness on feet (R26.81);Muscle weakness (generalized) (M62.81)     Time: 1359-1410 PT Time Calculation (min) (ACUTE ONLY): 11 min  Charges:    $Therapeutic Activity: 8-22 mins PT General Charges $$ ACUTE PT VISIT: 1 Visit                      Jerolyn Center, PT Acute Rehabilitation Services  Office [phone]    Zena Amos 02/24/2023, 2:19 PM

## 2023-02-24 NOTE — Progress Notes (Signed)
Progress Note   Patient: [name] ZOX:[id number] DOB: Apr 06, 1945 DOA: 02/07/2023     17 DOS: the patient was seen and examined on 02/24/2023   Brief hospital course: Patient was brought into the hospital after patient was found unresponsive by her daughter.  Patient was bradycardic, hypotensive on initial evaluation, pinpoint pupils, facial droop.  No response to Narcan Intubated in the ED for airway protection.  Workup done in the hospital revealed subdural hematoma, RSV infection, waxing and waning of mental status in the setting of vascular dementia.  02/21/2023: No new changes. 02/22/2023: Patient stable.  Replete phosphorus.  Phosphorus of 2.2.  Seen alongside patient's son.  Also updated patient's daughter, [name].  Communicated with palliative care team, will need a family meeting arranged (all can be on the same page). 02/23/2023: Patient seen.  No new changes.  Goal of care will need to be addressed.  Family meeting would have been ideal.  PEG tube option needs to be addressed.  Palliative care input is appreciated. 02/24/2023: Patient seen alongside patient's son and palliative care team.  No new changes.  Input from palliative care team is highly appreciated.  Assessment and Plan: Acute respiratory failure with hypoxia due to RSV infection Wheezing due to RSV   - Pulmicort/brovana bid - Yupelri 175 mcg neb daily  - Duoneb q4 hr PRN   - IV solumedrol 40 mg daily  02/22/2023: Continue supportive care. 02/23/2023.  Stable. 02/24/2023: Pursue disposition.   Acute encephalopathy with baseline vascular dementia New-onset seizures SDH - IV keppra 500 mg bid  - IV valproate 325 mg q8hr  - Aricept 10 mg PO at bedtime  - Lipitor 40 mg PO daily  02/21/2023: Monitor and manage expectantly. 02/23/2023: Query back to baseline. 02/24/2023: Stable.  Pursue disposition.   Hypotension - Resolved.    Chronic HFrEF History of MVR, redo bioprosthetic AVR with paroxysmal Afib -  Monitor    CKD stage 3b versus CKD 3a: -Renal function continues to improve. -Serum creatinine of 1.04.   Mild thrombocytopenia: -Resolved. -Platelet count of 187 today.  Chronic anemia - Monitor  -Hemoglobin of 8.5 g/dL today.   H/o GERD - IV protonix 40 mg daily    Hypoglycemia -Resolved.  Hypophosphatemia: -Phosphorus of 2.2. -IV sodium phosphate 15 mmol x 1 dose.  Hypomagnesemia: -Magnesium of 1.7. -IV magnesium 2 g x 1 dose.   Moderate protein energy malnutrition - Cortrack to 2/14 and will start supplemental tube feeds afterward   Deconditioning - PT/OT/SLP as able  Subjective:  -No history from patient.  Physical Exam: Vitals:   02/24/23 0337 02/24/23 0829 02/24/23 1223 02/24/23 1614  BP:   102/68 107/68  Pulse:   92 (!) 105  Resp:    (!) 35  Temp:   98.7 F (37.1 C) 99.7 F (37.6 C)  TempSrc:   Oral Oral  SpO2:  99% 100% 97%  Weight: 64.3 kg     Height:       Physical Exam Constitutional:      Comments: Nonverbal   HENT:     Head: Normocephalic.  Cardiovascular:     Rate and Rhythm: Normal rate.  Pulmonary:     Effort: Pulmonary effort is normal.  Abdominal:     Palpations: Abdomen is soft.  Musculoskeletal:     Cervical back: Neck supple.  Skin:    General: Skin is warm.  Neurological:     Comments: Unable to evaluate   Psychiatric:     Comments:  Unable to evaluate       Disposition: Status is: Inpatient Remains inpatient appropriate because: Cortrack placement 2/14  Planned Discharge Destination:  Dispo per pt's clinical progress    Time spent: 35 minutes  Author: Barnetta Chapel, MD 02/24/2023 4:20 PM  For on call review [email or web address].

## 2023-02-24 NOTE — Progress Notes (Signed)
Speech Language Pathology Treatment: Dysphagia  Patient Details Name: [name] MRN: [id number] DOB: 1945/08/02 Today's Date: 02/24/2023 Time: 1040-1100 SLP Time Calculation (min) (ACUTE ONLY): 20 min  Assessment / Plan / Recommendation Clinical Impression  Pt continues with Cortrak feedings, but oral intake has improved, particularly when family members are present for meals.  Today [name], her son, was at the bedside.  He fed her a container of applesauce and sips of water - she was receptive. There was occasional throat-clearing, mild cough, congestion.  D/W [name] the impact of dementia on swallowing, the waxing/waning of swallow function on any given day, the ongoing and increasing likelihood of aspiration of not only POs but saliva as well. He verbalized understanding.  Yong Channel, NP from Palliative Care, was present and facilitated ongoing conversations about [name]'s condition related to GOC.    Recommend continuing dysphagia 1, thin liquids.  Provide careful hand-feeding when family is not available. SLP will continue to follow while admitted.   HPI HPI: Pt is a 78 yo female who was found down by daughter and brought to ED 1/31 where she was noted to have pinpoint pupils, L hemiparesis, and right gaze deviation. She was intubated for airway protection. 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 MRI brain revealed no stroke. 2/10 - one way extubation (ETT ten days). 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  Recommendations may be updated based on patient status, additional functional criteria and insurance authorization.    Recommendations  Diet recommendations: Dysphagia 1 (puree);Thin liquid Liquids provided via: Cup;Teaspoon Medication Administration: Crushed with puree Supervision: Trained caregiver to feed patient Compensations: Minimize environmental distractions;Slow rate;Small sips/bites Postural Changes and/or Swallow Maneuvers: Seated upright 90 degrees                  Oral care BID   Frequent or constant Supervision/Assistance Dysphagia, oropharyngeal phase (R13.12)     Continue with current plan of care    Io Dieujuste L. [name], MA CCC/SLP Clinical Specialist - Acute Care SLP Acute Rehabilitation Services Office number [phone]  [name] [name]  02/24/2023, 11:04 AM

## 2023-02-25 ENCOUNTER — Inpatient Hospital Stay (HOSPITAL_COMMUNITY): Payer: 59

## 2023-02-25 DIAGNOSIS — Z7189 Other specified counseling: Secondary | ICD-10-CM | POA: Diagnosis not present

## 2023-02-25 DIAGNOSIS — Z515 Encounter for palliative care: Secondary | ICD-10-CM | POA: Diagnosis not present

## 2023-02-25 DIAGNOSIS — G9340 Encephalopathy, unspecified: Secondary | ICD-10-CM | POA: Diagnosis not present

## 2023-02-25 DIAGNOSIS — F0150 Vascular dementia without behavioral disturbance: Secondary | ICD-10-CM | POA: Diagnosis not present

## 2023-02-25 LAB — GLUCOSE, CAPILLARY
Glucose-Capillary: 124 mg/dL — ABNORMAL HIGH (ref 70–99)
Glucose-Capillary: 130 mg/dL — ABNORMAL HIGH (ref 70–99)
Glucose-Capillary: 133 mg/dL — ABNORMAL HIGH (ref 70–99)
Glucose-Capillary: 134 mg/dL — ABNORMAL HIGH (ref 70–99)
Glucose-Capillary: 148 mg/dL — ABNORMAL HIGH (ref 70–99)
Glucose-Capillary: 149 mg/dL — ABNORMAL HIGH (ref 70–99)

## 2023-02-25 LAB — RENAL FUNCTION PANEL
Albumin: 1.8 g/dL — ABNORMAL LOW (ref 3.5–5.0)
Anion gap: 5 (ref 5–15)
BUN: 24 mg/dL — ABNORMAL HIGH (ref 8–23)
CO2: 31 mmol/L (ref 22–32)
Calcium: 8.7 mg/dL — ABNORMAL LOW (ref 8.9–10.3)
Chloride: 102 mmol/L (ref 98–111)
Creatinine, Ser: 1.25 mg/dL — ABNORMAL HIGH (ref 0.44–1.00)
GFR, Estimated: 45 mL/min — ABNORMAL LOW (ref >60)
Glucose, Bld: 158 mg/dL — ABNORMAL HIGH (ref 70–99)
Phosphorus: 1.1 mg/dL — ABNORMAL LOW (ref 2.5–4.6)
Potassium: 4.9 mmol/L (ref 3.5–5.1)
Sodium: 138 mmol/L (ref 135–145)

## 2023-02-25 LAB — MAGNESIUM: Magnesium: 1.7 mg/dL (ref 1.7–2.4)

## 2023-02-25 MED ORDER — MAGNESIUM SULFATE 2 GM/50ML IV SOLN
2.0000 g | Freq: Once | INTRAVENOUS | Status: AC
  Administered 2023-02-25: 2 g via INTRAVENOUS
  Filled 2023-02-25: qty 50

## 2023-02-25 MED ORDER — SODIUM PHOSPHATES 45 MMOLE/15ML IV SOLN
45.0000 mmol | Freq: Once | INTRAVENOUS | Status: AC
  Administered 2023-02-25: 45 mmol via INTRAVENOUS
  Filled 2023-02-25: qty 15

## 2023-02-25 NOTE — Progress Notes (Signed)
 Palliative:  ***  Yong Channel, NP Palliative Medicine Team Pager [phone] (Please see [email or web address] for schedule) Team Phone [phone]

## 2023-02-25 NOTE — Progress Notes (Signed)
Progress Note   Patient: [name] ZOX:[id number] DOB: 17-May-1945 DOA: 02/07/2023     18 DOS: the patient was seen and examined on 02/25/2023   Brief hospital course: Patient was brought into the hospital after patient was found unresponsive by her daughter.  Patient was bradycardic, hypotensive on initial evaluation, pinpoint pupils, facial droop.  No response to Narcan.  Patient was intubated in the ED for airway protection.  Workup done in the hospital revealed subdural hematoma, RSV infection, waxing and waning of mental status in the setting of vascular dementia.  Patient has been extubated and transferred to [facility].  Palliative care team is following patient.  Cortrak tube was placed, while waiting for patient's family to make decision regarding PEG tube placement or not.  02/25/2023: Patient seen.  No new changes.  Low phosphorus and magnesium noted.  Input from palliative care team is highly appreciated.  Assessment and Plan: Acute respiratory failure with hypoxia due to RSV infection Wheezing due to RSV   - Pulmicort/brovana bid - Yupelri 175 mcg neb daily  - Duoneb q4 hr PRN   02/25/2023: Pursue disposition.   Acute encephalopathy with baseline vascular dementia New-onset seizures SDH - keppra 500 mg bid via tube - Valproate 325 mg q8hr via tube - Aricept 10 mg PO at bedtime    Hypotension -Continue to monitor closely.    Chronic HFrEF History of MVR, redo bioprosthetic AVR with paroxysmal Afib - Monitor    CKD stage 3b versus CKD 3a: -Serum creatinine of 1.25.   Mild thrombocytopenia: -Resolved. -Last Platelet count of 187.  Chronic anemia - Monitor  -Last Hemoglobin of 8.5 g/dL.   H/o GERD - IV protonix 40 mg daily    Hypoglycemia -Resolved.  Hypophosphatemia: -Phosphorus of 1.1. -IV sodium phosphate 45 mmol x 1 dose. Repeat renal panel in the morning.  Hypomagnesemia: -Magnesium of 1.7. -IV magnesium 2 g x 1 dose. Check magnesium level in  the morning.   Moderate protein energy malnutrition - Cortrack to 2/14 and will start supplemental tube feeds afterward   Deconditioning - PT/OT/SLP as able  Subjective:  -No history from patient.  Physical Exam: Vitals:   02/25/23 0501 02/25/23 0748 02/25/23 0802 02/25/23 1130  BP: 94/68  (!) 97/59 90/67  Pulse: 99  95   Resp:    18  Temp: 97.8 F (36.6 C)  98.6 F (37 C) 100.3 F (37.9 C)  TempSrc: Axillary  Axillary Axillary  SpO2: 99% 99% 99% 100%  Weight:      Height:       Physical Exam Constitutional:      Comments: Nonverbal   HENT:     Head: Normocephalic.  Cardiovascular:     Rate and Rhythm: Normal rate.  Pulmonary:     Comments: Decreased air entry with some expiratory wheeze. Abdominal:     Palpations: Abdomen is soft.  Musculoskeletal:     Cervical back: Neck supple.  Skin:    General: Skin is warm.  Neurological:     Comments: Unable to evaluate   Psychiatric:     Comments: Unable to evaluate       Disposition: Status is: Inpatient Remains inpatient appropriate because: Cortrack placement 2/14  Planned Discharge Destination:  Dispo per pt's clinical progress    Guarded prognosis.  Hospice and comfort care appropriate, however, palliative care team is still discussing with family.  The goal for now is to discharge patient to skilled nursing facility with hospice follow-up.  Time  spent: 35 minutes  Author: Barnetta Chapel, MD 02/25/2023 12:08 PM  For on call review [email or web address].

## 2023-02-25 NOTE — Plan of Care (Signed)
   Problem: Education: Goal: Knowledge of General Education information will improve Description: Including pain rating scale, medication(s)/side effects and non-pharmacologic comfort measures Outcome: Not Progressing   Problem: Health Behavior/Discharge Planning: Goal: Ability to manage health-related needs will improve Outcome: Not Progressing

## 2023-02-25 NOTE — TOC Progression Note (Incomplete)
Transition of Care [facility]) - Progression Note    Patient Details  Name: [name] MRN: [id number] Date of Birth: [birth year]  Transition of Care [facility]) CM/SW Contact  Baldemar Lenis, [place] Phone Number: 02/25/2023, 4:33 PM  Clinical Narrative:   CSW received call from daughter, [name], to discuss disposition. Family unable to take the patient home, patient will need SNF at discharge. [name] has preference for [facility] or [facility]. CSW completed referral and faxed out, asked [facility] to review. CSW to follow.    Expected Discharge Plan: Skilled Nursing Facility Barriers to Discharge: English as a second language teacher, Continued Medical Work up  Expected Discharge Plan and Services In-house Referral: Clinical Social Work Discharge Planning Services: CM Consult   Living arrangements for the past 2 months: Apartment                                       Social Determinants of Health (SDOH) Interventions SDOH Screenings   Food Insecurity: No Food Insecurity (02/09/2023)  Housing: Low Risk  (02/09/2023)  Transportation Needs: No Transportation Needs (02/09/2023)  Utilities: Not At Risk (02/09/2023)  Alcohol Screen: Low Risk  (09/02/2022)  Depression (PHQ2-9): Low Risk  (01/20/2023)  Financial Resource Strain: Low Risk  (09/02/2022)  Physical Activity: Insufficiently Active (09/02/2022)  Social Connections: Socially Isolated (02/09/2023)  Stress: No Stress Concern Present (09/02/2022)  Tobacco Use: Medium Risk (02/07/2023)  Health Literacy: Adequate Health Literacy (09/02/2022)    Readmission Risk Interventions     No data to display

## 2023-02-25 NOTE — [place] FL2 (Signed)
[place] MEDICAID FL2 LEVEL OF CARE FORM     IDENTIFICATION  Patient Name: [name] Birthdate: [birth year] Sex: female Admission Date (Current Location): 02/07/2023  [facility] and [place] Number:  Producer, television/film/video and Address:  [facility], [address], [place], [place] [address]      Provider Number: [id number]  Attending Physician Name and Address:  Barnetta Chapel, MD  Relative Name and Phone Number:       Current Level of Care: Hospital Recommended Level of Care: Skilled Nursing Facility Prior Approval Number:    Date Approved/Denied:   PASRR Number: [id number]  Discharge Plan: SNF    Current Diagnoses: Patient Active Problem List   Diagnosis Date Noted   Dysphagia 02/20/2023   Acute encephalopathy 02/19/2023   AKI (acute kidney injury) (HCC) 02/19/2023   Vascular dementia (HCC) 02/15/2023   DNR (do not resuscitate) 02/15/2023   Goals of care, counseling/discussion 02/15/2023   On mechanically assisted ventilation (HCC) 02/15/2023   RSV (acute bronchiolitis due to respiratory syncytial virus) 02/15/2023   RSV infection 02/13/2023   Warfarin toxicity, accidental or unintentional, initial encounter 02/11/2023   RSV (respiratory syncytial virus pneumonia) 02/10/2023   Altered mental status 02/10/2023   Subdural hematoma (HCC) 02/09/2023   Hypotension 02/09/2023   Acute respiratory failure (HCC) 02/09/2023   Encephalopathy acute 02/07/2023   Submandibular lymphadenopathy 01/20/2023   Encounter for general adult medical examination with abnormal findings 10/27/2020   Neck pain 07/05/2017   Seasonal allergic rhinitis 10/04/2016   Chronic gout 08/03/2016   Vascular dementia, uncomplicated (HCC) 05/17/2016   Chest congestion 05/06/2016   S/P mitral valve replacement with bioprosthetic valve 04/25/2015   Essential hypertension 04/25/2015   Chronic anticoagulation 04/25/2015   Intracranial vascular stenosis     Hyperlipidemia    Chronic systolic CHF (congestive heart failure) (HCC) 01/04/2015   CKD (chronic kidney disease) stage 3, GFR 30-59 ml/min (HCC) 01/04/2015   Atrial fibrillation (HCC) 09/22/2014   RBBB 03/11/2014    Orientation RESPIRATION BLADDER Height & Weight     Self  Normal Incontinent Weight: 142 lb 3.2 oz (64.5 kg) Height:  5\' 4"  (162.6 cm)  BEHAVIORAL SYMPTOMS/MOOD NEUROLOGICAL BOWEL NUTRITION STATUS      Incontinent Diet (see DC summary)  AMBULATORY STATUS COMMUNICATION OF NEEDS Skin   Extensive Assist Verbally Skin abrasions (lip, no dressing: right arm, foam dressing: changed daily)                       Personal Care Assistance Level of Assistance  Bathing, Feeding, Dressing Bathing Assistance: Maximum assistance Feeding assistance: Maximum assistance Dressing Assistance: Maximum assistance     Functional Limitations Info  Sight, Speech Sight Info: Impaired   Speech Info: Impaired (delayed responses)    SPECIAL CARE FACTORS FREQUENCY  PT (By licensed PT), OT (By licensed OT), Speech therapy     PT Frequency: 5x/wk OT Frequency: 5x/wk     Speech Therapy Frequency: 5x/wk      Contractures Contractures Info: Not present    Additional Factors Info  Code Status, Allergies, Isolation Precautions, Psychotropic Code Status Info: DNR Allergies Info: Augmentin (Amoxicillin-pot Clavulanate) Psychotropic Info: Aricept 10mg  daily   Isolation Precautions Info: droplet: RSV 02/09/23     Current Medications (02/25/2023):  This is the current hospital active medication list Current Facility-Administered Medications  Medication Dose Route Frequency Provider Last Rate Last Admin   allopurinol (ZYLOPRIM) tablet 100 mg  100 mg Per  Tube Daily Utomwen, Adesuwa, RPH   100 mg at 02/25/23 0918   arformoterol (BROVANA) nebulizer solution 15 mcg  15 mcg Nebulization BID Lanier Clam, NP   15 mcg at 02/25/23 0747   atorvastatin (LIPITOR) tablet 40 mg  40 mg Per Tube  Daily Utomwen, Adesuwa, RPH   40 mg at 02/25/23 0918   budesonide (PULMICORT) nebulizer solution 0.5 mg  0.5 mg Nebulization BID Lanier Clam, NP   0.5 mg at 02/25/23 0747   dextrose 10 % infusion   Intravenous Continuous Paliwal, Aditya, MD 20 mL/hr at 02/25/23 0441 Infusion Verify at 02/25/23 0441   docusate (COLACE) 50 MG/5ML liquid 100 mg  100 mg Per Tube BID PRN Utomwen, Adesuwa, RPH       donepezil (ARICEPT) tablet 10 mg  10 mg Per Tube QHS Utomwen, Adesuwa, RPH   10 mg at 02/24/23 2200   feeding supplement (OSMOLITE 1.2 CAL) liquid 1,000 mL  1,000 mL Per Tube Continuous Berton Mount I, MD 55 mL/hr at 02/25/23 1518 1,000 mL at 02/25/23 1518   Gerhardt's butt cream   Topical PRN Cheri Fowler, MD       glycopyrrolate (ROBINUL) injection 0.1 mg  0.1 mg Intravenous Q8H PRN Karie Fetch P, DO   0.1 mg at 02/17/23 1210   heparin injection 5,000 Units  5,000 Units Subcutaneous Q8H Karie Fetch P, DO   5,000 Units at 02/25/23 1343   ipratropium-albuterol (DUONEB) 0.5-2.5 (3) MG/3ML nebulizer solution 3 mL  3 mL Nebulization Q4H PRN Lanier Clam, NP   3 mL at 02/16/23 1019   levETIRAcetam (KEPPRA) tablet 500 mg  500 mg Per Tube BID Utomwen, Adesuwa, RPH   500 mg at 02/25/23 1610   Oral care mouth rinse  15 mL Mouth Rinse 4 times per day Karie Fetch P, DO   15 mL at 02/25/23 1210   Oral care mouth rinse  15 mL Mouth Rinse PRN Karie Fetch P, DO       pantoprazole (PROTONIX) injection 40 mg  40 mg Intravenous Q24H Karie Fetch P, DO   40 mg at 02/25/23 0925   polyethylene glycol (MIRALAX / GLYCOLAX) packet 17 g  17 g Per Tube Daily PRN Leander Rams, RPH       revefenacin (YUPELRI) nebulizer solution 175 mcg  175 mcg Nebulization Daily Bowser, Kaylyn Layer, NP   175 mcg at 02/25/23 0746   sodium phosphate 45 mmol in sodium chloride 0.9 % 250 mL infusion  45 mmol Intravenous Once Berton Mount I, MD       thiamine (VITAMIN B1) tablet 100 mg  100 mg Per Tube Daily Utomwen, Adesuwa, RPH   100 mg  at 02/25/23 0918   valproic acid (DEPAKENE) 250 MG/5ML solution 325 mg  325 mg Per Tube Q8H Utomwen, Adesuwa, RPH   325 mg at 02/25/23 1343     Discharge Medications: Please see discharge summary for a list of discharge medications.  Relevant Imaging Results:  Relevant Lab Results:   Additional Information SS#: [id number]  Baldemar Lenis, LCSW

## 2023-02-26 ENCOUNTER — Inpatient Hospital Stay (HOSPITAL_COMMUNITY): Payer: 59

## 2023-02-26 ENCOUNTER — Other Ambulatory Visit: Payer: Self-pay

## 2023-02-26 DIAGNOSIS — R509 Fever, unspecified: Secondary | ICD-10-CM | POA: Diagnosis not present

## 2023-02-26 DIAGNOSIS — R627 Adult failure to thrive: Secondary | ICD-10-CM | POA: Diagnosis not present

## 2023-02-26 DIAGNOSIS — Z7189 Other specified counseling: Secondary | ICD-10-CM | POA: Diagnosis not present

## 2023-02-26 DIAGNOSIS — N179 Acute kidney failure, unspecified: Secondary | ICD-10-CM | POA: Diagnosis not present

## 2023-02-26 DIAGNOSIS — J9601 Acute respiratory failure with hypoxia: Secondary | ICD-10-CM | POA: Diagnosis not present

## 2023-02-26 DIAGNOSIS — F0150 Vascular dementia without behavioral disturbance: Secondary | ICD-10-CM | POA: Diagnosis not present

## 2023-02-26 DIAGNOSIS — E440 Moderate protein-calorie malnutrition: Secondary | ICD-10-CM | POA: Insufficient documentation

## 2023-02-26 DIAGNOSIS — G9340 Encephalopathy, unspecified: Secondary | ICD-10-CM | POA: Diagnosis not present

## 2023-02-26 DIAGNOSIS — N1832 Chronic kidney disease, stage 3b: Secondary | ICD-10-CM | POA: Diagnosis not present

## 2023-02-26 DIAGNOSIS — B974 Respiratory syncytial virus as the cause of diseases classified elsewhere: Secondary | ICD-10-CM

## 2023-02-26 DIAGNOSIS — Z515 Encounter for palliative care: Secondary | ICD-10-CM | POA: Diagnosis not present

## 2023-02-26 LAB — GLUCOSE, CAPILLARY
Glucose-Capillary: 113 mg/dL — ABNORMAL HIGH (ref 70–99)
Glucose-Capillary: 121 mg/dL — ABNORMAL HIGH (ref 70–99)
Glucose-Capillary: 142 mg/dL — ABNORMAL HIGH (ref 70–99)
Glucose-Capillary: 143 mg/dL — ABNORMAL HIGH (ref 70–99)
Glucose-Capillary: 145 mg/dL — ABNORMAL HIGH (ref 70–99)
Glucose-Capillary: 165 mg/dL — ABNORMAL HIGH (ref 70–99)

## 2023-02-26 LAB — TROPONIN I (HIGH SENSITIVITY): Troponin I (High Sensitivity): 61 ng/L — ABNORMAL HIGH (ref <18)

## 2023-02-26 LAB — RENAL FUNCTION PANEL
Albumin: 1.9 g/dL — ABNORMAL LOW (ref 3.5–5.0)
Anion gap: 8 (ref 5–15)
BUN: 27 mg/dL — ABNORMAL HIGH (ref 8–23)
CO2: 27 mmol/L (ref 22–32)
Calcium: 8.8 mg/dL — ABNORMAL LOW (ref 8.9–10.3)
Chloride: 101 mmol/L (ref 98–111)
Creatinine, Ser: 1.71 mg/dL — ABNORMAL HIGH (ref 0.44–1.00)
GFR, Estimated: 31 mL/min — ABNORMAL LOW (ref >60)
Glucose, Bld: 168 mg/dL — ABNORMAL HIGH (ref 70–99)
Phosphorus: 3.2 mg/dL (ref 2.5–4.6)
Potassium: 5 mmol/L (ref 3.5–5.1)
Sodium: 136 mmol/L (ref 135–145)

## 2023-02-26 LAB — CBC
HCT: 25.3 % — ABNORMAL LOW (ref 36.0–46.0)
Hemoglobin: 7.4 g/dL — ABNORMAL LOW (ref 12.0–15.0)
MCH: 24.3 pg — ABNORMAL LOW (ref 26.0–34.0)
MCHC: 29.2 g/dL — ABNORMAL LOW (ref 30.0–36.0)
MCV: 83 fL (ref 80.0–100.0)
Platelets: UNDETERMINED 10*3/uL (ref 150–400)
RBC: 3.05 MIL/uL — ABNORMAL LOW (ref 3.87–5.11)
RDW: 24.9 % — ABNORMAL HIGH (ref 11.5–15.5)
WBC: 14.2 10*3/uL — ABNORMAL HIGH (ref 4.0–10.5)
nRBC: 0.2 % (ref 0.0–0.2)

## 2023-02-26 LAB — LACTIC ACID, PLASMA: Lactic Acid, Venous: 1.1 mmol/L (ref 0.5–1.9)

## 2023-02-26 LAB — MAGNESIUM: Magnesium: 1.8 mg/dL (ref 1.7–2.4)

## 2023-02-26 MED ORDER — CHLORHEXIDINE GLUCONATE CLOTH 2 % EX PADS
6.0000 | MEDICATED_PAD | Freq: Every day | CUTANEOUS | Status: DC
  Administered 2023-02-26 – 2023-03-03 (×6): 6 via TOPICAL

## 2023-02-26 MED ORDER — ALBUTEROL SULFATE (2.5 MG/3ML) 0.083% IN NEBU
2.5000 mg | INHALATION_SOLUTION | RESPIRATORY_TRACT | Status: DC | PRN
Start: 2023-02-26 — End: 2023-03-04

## 2023-02-26 MED ORDER — SODIUM CHLORIDE 0.9 % IV BOLUS
500.0000 mL | Freq: Once | INTRAVENOUS | Status: AC
  Administered 2023-02-26: 500 mL via INTRAVENOUS

## 2023-02-26 MED ORDER — ALBUMIN HUMAN 25 % IV SOLN
25.0000 g | Freq: Four times a day (QID) | INTRAVENOUS | Status: AC
  Administered 2023-02-26 – 2023-02-27 (×4): 25 g via INTRAVENOUS
  Filled 2023-02-26 (×4): qty 100

## 2023-02-26 MED ORDER — SODIUM CHLORIDE 0.9 % IV SOLN: INTRAVENOUS | Status: DC

## 2023-02-26 MED ORDER — SODIUM CHLORIDE 0.9 % IV SOLN: 500.0000 mg | Freq: Three times a day (TID) | INTRAVENOUS | Status: DC

## 2023-02-26 MED ORDER — LACTATED RINGERS IV SOLN: INTRAVENOUS | Status: DC

## 2023-02-26 MED ORDER — SODIUM CHLORIDE 0.9 % IV SOLN
1.0000 g | Freq: Two times a day (BID) | INTRAVENOUS | Status: DC
  Administered 2023-02-26 – 2023-02-28 (×4): 1 g via INTRAVENOUS
  Filled 2023-02-26 (×5): qty 20

## 2023-02-26 NOTE — Progress Notes (Addendum)
Palliative:  HPI: 78 y.o. female with past medical history of mitral valve replacement, a-fib on warfarin, endocarditis, hypertension, and vascular dementia admitted on 02/07/2023 after being found unresponsive by her daughter. On arrival to the ED, she was noted to have pinpoint pupils, left hemiparesis and right gaze deviation. She was intubated for airway protection. 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 MRI brain revealed no stroke. Patient was given Ativan and loaded with Keppra. Neurosurgery felt that the small size of the subdural hematoma did not warrant surgical intervention. 1/31 intubated for airway protection. 2/3 +RSV. PCCM with ongoing GOC conversations and plans for one way extubation 2/10.  Failure to thrive.   I met today with [name] along with SLP and OT. [name] is sleepy but accepting part of her breakfast from but very exhausted with increased intake. Struggles more with liquids as they tend to spill out of mouth. She is more interactive when family is at bedside. Ongoing congestion and very concerned about ongoing aspiration.   I left voicemail with son, [name], yesterday. No family at bedside today. I called daughter, [name]. [name] reports no questions or concerns for me and reports that they are awaiting placement and working with CSW. I reiterated my concern that [name] is not eating but a few bites when family not present for feeding. I worry that this will not be sustainable for her. I asked if family had come to a conclusion about PEG tube IF indicated and [name] shares that they have not.   Unfortunately conversations have been very difficult. We have been unable to get family together for conversation since they were at bedside at time of extubation. They are very much struggling with poor prognosis. Needs further conversation and would benefit from family meeting.  I will continue efforts.   Exam:  Awakens briefly. Falls back asleep with ease. She has inconsistent verbal response - sometimes difficult to understand. Smiles. No distress. Breathing regular, unlabored. Generalized weakness and fatigue.    Plan:  - DNR/DNI.  - Family not to bedside to feed her yesterday and only a couple bites accepted from staff. This will not be sustainable.  - Family struggling with decisions. Will continue efforts to continue conversation.   35 min  Yong Channel, NP Palliative Medicine Team Pager [phone] (Please see [email or web address] for schedule) Team Phone [phone]

## 2023-02-26 NOTE — Progress Notes (Signed)
Patient's BP Has been soft but begin decreasing with systolic in the 80's. Pt received 500 ml NS bolus. Patient only responded to pain.  Notified Dr. Nelda Severe.  Dr. Nelda Severe called PCCM and notified the patient's daughter of patient's status.  PCCM MD came to beside and ordered interventions.  Patient transferred to ICU after CT of her head, with transport by RRT and Consulting civil engineer.  Family member was at bedside. No belongings at bedside other than cleaning wipes.  Sent those with the patient.

## 2023-02-26 NOTE — TOC Progression Note (Addendum)
Transition of Care [facility]) - Progression Note    Patient Details  Name: [name] MRN: [id number] Date of Birth: 1945/04/07  Transition of Care [facility]) CM/SW Contact  Baldemar Lenis, [place] Phone Number: 02/26/2023, 10:47 AM  Clinical Narrative:   [facility] can offer a bed for patient. CSW attempted to reach daughter, [name], to provide update, left a text message. Awaiting medical stability for SNF placement. CSW to follow.   UPDATE: CSW received a call back from [name] that she has changed her mind on SNF placement and would like to bring the patient home instead, as that's where she wants to be. [name] is working with [employer] to get caregivers in the home, needs assistance with completing paperwork and having MD sign. CSW can assist if [name] provides the paperwork, will bring it to the bedside tomorrow when able pending the weather. Patient has no DME at home, CSW explained process of asking therapy to evaluate for recommended DME at home, and CSW can assist with ordering. [name] also in agreement with home health therapy. CSW discussed choice, no preference in provider. CSW to follow to work towards getting patient home instead of SNF.    Expected Discharge Plan: Skilled Nursing Facility Barriers to Discharge: English as a second language teacher, Continued Medical Work up  Expected Discharge Plan and Services In-house Referral: Clinical Social Work Discharge Planning Services: CM Consult   Living arrangements for the past 2 months: Apartment                                       Social Determinants of Health (SDOH) Interventions SDOH Screenings   Food Insecurity: No Food Insecurity (02/09/2023)  Housing: Low Risk  (02/09/2023)  Transportation Needs: No Transportation Needs (02/09/2023)  Utilities: Not At Risk (02/09/2023)  Alcohol Screen: Low Risk  (09/02/2022)  Depression (PHQ2-9): Low Risk  (01/20/2023)  Financial Resource Strain: Low Risk  (09/02/2022)  Physical  Activity: Insufficiently Active (09/02/2022)  Social Connections: Socially Isolated (02/09/2023)  Stress: No Stress Concern Present (09/02/2022)  Tobacco Use: Medium Risk (02/07/2023)  Health Literacy: Adequate Health Literacy (09/02/2022)    Readmission Risk Interventions     No data to display

## 2023-02-26 NOTE — Progress Notes (Signed)
Speech Language Pathology Treatment: Dysphagia  Patient Details Name: [name] MRN: [id number] DOB: Aug 21, 1945 Today's Date: 02/26/2023 Time: 4098-1191 SLP Time Calculation (min) (ACUTE ONLY): 28 min  Assessment / Plan / Recommendation Clinical Impression  [name] was seen in co-treatment with OT to maximize positioning/head control for safe feeding.  Pt alert, not following commands, C/W prior sessions.  Oral care was provided, removing standing secretions from oral cavity/pharynx. Once mouth was cleaned, pt initiated oral movements to accept purees from spoon and engaged in swallowing.  Despite max cues, she was unable to draw water from a straw at any time during the session. She accepted small amounts from straw tip, losing portion of bolus anteriorly.  Swallow was palpable and there were no overt s/s of aspiration.  She appeared to clear solids from oral cavity- intermittent oral suctioning did not remove food residue from mouth.  Pt continues to require total assist for mouth care and feeding. Intermittent alertness allows only minimal PO intake. Her condition is compatible with advancing dementia and its concomitant deterioration of eating/swallowing abilities.  Recommend continued careful hand-feeding from staff. Provide oral care before meals. Cease feeding when not pt not adequately alert.  SLP will follow pending D/C.    HPI HPI: Pt is a 78 yo female who was found down by daughter and brought to ED 1/31 where she was noted to have pinpoint pupils, L hemiparesis, and right gaze deviation. She was intubated for airway protection. 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 MRI brain revealed no stroke. 2/10 - one way extubation (ETT ten days). PMH: dementia with memory loss but  functional soical communication with family; acute CHF, HLD, HTN,  Prosthetic valve dysfunction (07/22/2014), S/P MVR  (mitral valve replacement) (04/11/1998), S/P redo mitral valve replacement with bioprosthetic valve (07/25/2014), Stroke, Thrombosis of prosthetic heart valve (07/25/2014), and Valvular endocarditis (2000)      SLP Plan  Discharge SLP treatment due to (comment)      Recommendations for follow up therapy are one component of a multi-disciplinary discharge planning process, led by the attending physician.  Recommendations may be updated based on patient status, additional functional criteria and insurance authorization.    Recommendations  Diet recommendations: Dysphagia 1 (puree);Thin liquid Liquids provided via: Cup;Teaspoon;Straw Medication Administration: Crushed with puree Supervision: Trained caregiver to feed patient Compensations: Minimize environmental distractions;Slow rate;Small sips/bites Postural Changes and/or Swallow Maneuvers: Seated upright 90 degrees                  Oral care BID   Frequent or constant Supervision/Assistance Dysphagia, oropharyngeal phase (R13.12)     Discharge SLP treatment due to (comment)    Anish Vana L. [name], MA CCC/SLP Clinical Specialist - Acute Care SLP Acute Rehabilitation Services Office number [phone]  [name] [name]  02/26/2023, 10:05 AM

## 2023-02-26 NOTE — Progress Notes (Signed)
   02/26/23 1600  Vitals  Temp 98.2 F (36.8 C)  Temp Source Oral  BP (!) 80/59  MAP (mmHg) 68  BP Location Left Arm  BP Method Automatic  Patient Position (if appropriate) Lying  Pulse Rate 93  Pulse Rate Source Dinamap  ECG Heart Rate 94  Resp 20  Level of Consciousness  Level of Consciousness Responds to Pain  Oxygen Therapy  SpO2 99 %  O2 Device Nasal Cannula  O2 Flow Rate (L/min) 2 L/min  MEWS Score  MEWS Temp 0  MEWS Systolic 2  MEWS Pulse 0  MEWS RR 0  MEWS LOC 2  MEWS Score 4  MEWS Score Color Red

## 2023-02-26 NOTE — Progress Notes (Signed)
Red mews.  Notified MD .  500 ml NS bolus administered.  02/26/23 1420  Vitals  Temp 99.3 F (37.4 C)  Temp Source Axillary  BP (!) 81/54  MAP (mmHg) (!) 64  BP Location Left Arm  BP Method Automatic  Patient Position (if appropriate) Sitting  Pulse Rate 96  Pulse Rate Source Dinamap  ECG Heart Rate 97  Resp 19  Level of Consciousness  Level of Consciousness Responds to Pain  Oxygen Therapy  SpO2 96 %  O2 Device Nasal Cannula  O2 Flow Rate (L/min) 2 L/min  MEWS Score  MEWS Temp 0  MEWS Systolic 1  MEWS Pulse 0  MEWS RR 0  MEWS LOC 2  MEWS Score 3  MEWS Score Color Yellow

## 2023-02-26 NOTE — Progress Notes (Signed)
NAME:  [name], MRN:  [id number], DOB:  09-28-1945, LOS: 19 ADMISSION DATE:  02/07/2023, CONSULTATION DATE:  02/07/2023 REFERRING MD: Rubin Payor - EDP, CHIEF COMPLAINT:  AMS, code stroke   History of Present Illness:  Patient was brought into the hospital following being found unresponsive by her daughter, last known well 730 this morning.  She was found when daughter came back from work, difficulty breathing, EMS activated.  Was bradycardic, hypotensive on initial evaluation, pinpoint pupils, facial droop.  No response to Narcan.  Intubated in the ED for airway protection.  Daughter is unclear whether she may have had a seizure.  Had some chest congestion over the last week.  Was her usual self this morning when she was last seen.  She was admitted by PCCM on 1/31.  In the ICU, she was treated for acute respiratory failure secondary to RSV pneumonia.  Her encephalopathy was felt to be multifactorial with seizure/postictal state, RSV infection, subdural hematoma and underlying vascular dementia.  Subsequent to extubation, she has required some suctioning.  She remained somewhat lethargic but was apparently able to phonate/vocalize.  She had dysphagia, which prompted placement of Cortrak.  She also had acute kidney injury, which was suspected to be due to poor oral intake.  She transferred out of ICU on 2/13.  PCCM was asked to see the patient again for hypotension.  Case discussed with the nurse at bedside.  She has been off maintenance fluids asside from D10 infusion which was discontinued.  Prior to my arrival, the patient did get a normal saline bolus (500 mL) which did transiently demonstrate a favorable response with improvement of her MAP to 70s, which was short-lived. The patient also had a new fever earlier this morning.  Given ongoing discussions regarding goals of care and CODE STATUS determination, the patient's clinical deterioration was discussed with the potential need for  vasopressors and central line placement, which were agreeable to the daughter.  Of note, she also informed me that she has just been diagnosed with acute influenza A infection and has been visiting the patient during the past several days.  PAST MEDICAL HISTORY significant for vascular dementia, stroke, atrial fibrillation, mitral valve regurgitation (s/p bioprosthetic valve).  Pertinent  Medical History   Past Medical History:  Diagnosis Date   Acute CHF (congestive heart failure) (HCC) 12/14/2014   Hyperlipidemia    Hypertension    Prosthetic valve dysfunction 07/22/2014   Acute mitral stenosis due to restricted leaflet mobility of bileaflet mechanical mitral valve prosthesis   S/P MVR (mitral valve replacement) 04/11/1998   #29 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  No mass effect, no midline shift.  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  Required intubation for airway protection.  Seen by neurosurgery felt not a candidate for surgery with small acute on chronic subdurals recommended against reversal of warfarin given concern about basilar artery occlusion.  Seen by neurology, EEG ordered, started on Keppra.  Hypotensive, started on norepinephrine 2/1 EEG suggesting moderate to severe diffuse encephalopathy but no  seizure or epileptiform discharges.  Repeat CT head ordered, and showing no new changes 2/2 eeg w areas of increased epileptogenicity + slowing. Off cont sedation. ECHO  w MV gradient change and  decr EF 2/3  + RSV; EEG in place which is showing high risk of epileptogenicity but no active seizures; neuro added valproic acid 2/5 plan to cont through the weekend and re-eval GOC . INR 3.6 not on AC 2/6 recheck INR  2/8 more awake. Met with family. DNR. Discussing possible 1-way extubation 2/10 One-way extubation completed. Tolerated well. 2/11: was signed off on dysphagia diet  2/12: mental status waxes and wanes. Family convo about cortrak possibly for ongoing med needs, supplemental TF 2/13: family agrees to cortrak  .... 2/19 PCCM reconsulted for hypotension.  Interim History / Subjective:  She was admitted by PCCM on 1/31.  In the ICU, she was treated for acute respiratory failure secondary to RSV pneumonia.  Her encephalopathy was felt to be multifactorial with seizure/postictal state, RSV infection, subdural hematoma and underlying vascular dementia.  Subsequent to extubation, she has required some suctioning.  She remained somewhat lethargic but was apparently able to phonate/vocalize.  She had dysphagia, which prompted placement of Cortrak.  She also had acute kidney injury, which was suspected to be due to poor oral intake.  She transferred out of ICU on 2/13.  PCCM was asked to see the patient again for hypotension.  Case discussed with the nurse at bedside.  She has been off maintenance fluids asside from D10 infusion which was discontinued.  Prior to my arrival, the patient did get a normal saline bolus (500 mL) which did transiently demonstrate a favorable response with improvement of her MAP to 70s, which was short-lived. The patient also had a new fever earlier this morning.  Given ongoing discussions regarding goals of care and CODE STATUS determination, the patient's clinical deterioration was discussed with the potential need for vasopressors and central line placement, which were agreeable to the daughter.  Of note, she also informed me that she has just been diagnosed with  acute influenza A infection and has been visiting the patient during the past several days.  Objective   Blood pressure (!) 88/59, pulse (!) 103, temperature 100.2 F (37.9 C), temperature source Oral, resp. rate (!) 23, height 5\' 4"  (1.626 m), weight 65.8 kg, SpO2 92%.    FiO2 (%):  [28 %] 28 %   Intake/Output Summary (Last 24 hours) at 02/26/2023 1713 Last data filed at 02/26/2023 1706 Gross per 24 hour  Intake 3216.04 ml  Output 1250 ml  Net 1966.04 ml   Filed Weights   02/24/23 0337 02/25/23 0500 02/26/23 0457  Weight: 64.3 kg 64.5 kg 65.8 kg   Physical Examination: General: acute on chronically ill appearing, older female, laying in bed, no acute distress  HEENT: anicteric sclera, PERRLA, mucous membranes dry  Neuro: awake but appears drowsy. Looks at provider to verbal command.  Not verbal.  Does not follow commands.  Globally very weak.  Weak cough.  Audible upper airway rattle. CV: s1s2, rrr, no murmur, rub, gallop  PULM: respirations even and unlabored. Productive cough, weak. On 4LNC. NTS PRN GI: rounded, soft, ntnd Extremities: No significant LE edema noted. Skin: Warm/dry  [facility] Problem List:   Lactic acidosis Recurrent hypoglycemia Possible aspiration pneumonia s/p 5-day Rocephin   Assessment & Plan:  Hypotension/Fever Blood and urine culture.  Flu swab. Check lactate. IV bolus: already received NS 500 mL bolus.  Maintenance fluids: NS @ 75 mL/hr. 25% Albumin 25 g IV q 4 hr x 4 doses. May need PICC or CVC insertion in the event that vasopressor administration is required.  Patient's daughter [name]) has agreed. Transfer to ICU  AKI on CKD stage 3b, worse Cr 1.25-->1.71, likely due to recurrent prerenal failure.  Monitor I&Os, assess diuresis needs daily Avoid nephrotoxic agents as able Ensure adequate renal perfusion  Acute respiratory failure with hypoxia due to RSV infection Wheezing due to RSV   Continue  Brovana/Pulmicort/Yupelri Titrate supplemental O2 for sat >90%  Albuterol as needed. Pulmonary toilet: nasotracheal suctioning.  Mouth care. One way extubation 2/10. Palliative following   Acute encephalopathy with baseline vascular dementia New-onset seizures SDH STAT head CT for aphasia Continue Keppra/valproate Continue modafinil. Continue Aricept. PT/OT/SLP as below   Chronic HFrEF History of MVR, redo bioprosthetic AVR with paroxysmal Afib heparin for DVT ppx  will need to restart anticoagulation, likely Eliquis. Need to speak with family about risk/benefit of this.   Mild thrombocytopenia, improving Chronic anemia Check CBC  H/o GERD - PPI  Hypoglycemia Likely from stopping tube feeds. Based swallow eval for dysphagia 1 diet. However, mental status precludes consistent nutrition  -monitor cbg q4h  -can start d5 if needed  -Cortrack to 2/14  Moderate protein energy malnutrition -Cortrack to 14 and will start supplemental tube feeds afterward   Deconditioning - PT/OT/SLP as able  GOC: Palliative care notes reviewed.  Help appreciated.  Best Practice: (right click and "Reselect all SmartList Selections" daily)   Diet/type: tubefeeds and NPO DVT prophylaxis SCD SQH  Pressure ulcer(s): N/A GI prophylaxis: PPI Lines: N/A Foley:  N/A Code Status:  limited Last date of multidisciplinary goals of care discussion: made DNR 2/10 with one way extubation. Ongoing GOC discussion  Critical care time: 60 minutes  Critical care time: 60 minutes.  The treatment and management of the patient's condition was required based on the threat of imminent deterioration. This time reflects time spent by the physician evaluating, providing care and managing the critically ill patient's care. The time was spent at the immediate bedside (or on the same floor/unit and dedicated to this patient's care). Time involved in separately billable procedures is NOT included int he critical care  time indicated above. Family meeting and update time may be included above if and only if the patient is unable/incompetent to participate in clinical interview and/or decision making, and the discussion was necessary to determining treatment decisions.  Marcelle Smiling, MD Board Certified by the ABIM, Pulmonary Diseases & Critical Care Medicine  02/26/23 5:54 PM  Please see [email or web address] for pager details.  From 7A-7P if no response, please call [phone] After hours, please call ELink [phone]

## 2023-02-26 NOTE — Progress Notes (Signed)
Nutrition Follow-up  DOCUMENTATION CODES:  Non-severe (moderate) malnutrition in context of chronic illness  INTERVENTION:  Continue TF via cortrak tube: Osmolite 1.2 at goal rate of 103ml/h Add free water flush Provides: 1584 kcal, 73g protein and free water daily Continue PO diet per SLP recommendations Currently on dysphagia 1/thin liquids Feeding assistance Discontinue IVF as TF at goal and no issues with CBGs, discussed with MD  NUTRITION DIAGNOSIS:  Moderate Malnutrition related to chronic illness (dementia) as evidenced by mild fat depletion, severe muscle depletion. - New dx established 2/19  GOAL:  Patient will meet greater than or equal to 90% of their needs - diet in place, on TF at goal  MONITOR:  PO intake, Weight trends, Labs, Supplement acceptance  REASON FOR ASSESSMENT:  Consult Enteral/tube feeding initiation and management (Trickle start)  ASSESSMENT:  78 yo female admitted after being found unresponsive by daughter, bradycardic and hypotensive but did not lose pulse, intubated for airway protection, possible seizure.  PMH includes HTN, HLD, stroke, mitral stenosis requiring MVR, CKD 3, vascular dementia  1/31 - Admitted, intubated, CT head with R SDH 2/2 - TF initiated 2/10 - One way extubation, TF discontinued 2/11 - MB, advanced to Dysphagia 1, Thins 2/14 - cortrak placed  Pt resting in bed at the time of assessment. Does not open eyes or engage in any way. Breakfast tray at bedside noted to be minimally consumed. RN reports that SLP attempted to get pt to eat but she refused.   Noted D10 initiated 2/13 and still infusing. Pt with edema to her hands and BLE. Noted that CBGs dropped low between extubation and resumption of TF with cortrak tube placement and IVF were started. Discussed with MD, ok to discontinue at this time.   Discussed with PMT. Pt eats well for family and it was suggested that they come assist her with meals. Family has discussed  PEG but no decisions have been made as she is able to consume foods when agreeable.   If pt is otherwise medically stable, would recommend placing PEG for nutrition so pt can discharge to next venue of care or if this is not align with pt's wishes, remove cortrak and allow careful hand feeding as pt shows interest. Long term enteral support has not been shown to increase quality or quantity of life in pt's with dementia.   Admit weight: 60 kg Current weight: 65.8 kg  Edema documented to all extremities and generalized 2/18  Average Meal Intake: 2/11-2/18: 38% intake x 5 recorded meals  Nutritionally Relevant Medications: Scheduled Meds:  atorvastatin  40 mg Per Tube Daily   pantoprazole IV  40 mg Intravenous Q24H   thiamine  100 mg Per Tube Daily   Continuous Infusions:  dextrose 20 mL/hr at 02/26/23 0981   feeding supplement (OSMOLITE 1.2 CAL) 1,000 mL (02/25/23 1518)   PRN Meds:.docusate, polyethylene glycol  Labs Reviewed: BUN 27, creatinine 1.71 CBG ranges from 124-165 mg/dL over the last 24 hours  NUTRITION - FOCUSED PHYSICAL EXAM: Flowsheet Row Most Recent Value  Orbital Region Mild depletion  Upper Arm Region Severe depletion  Thoracic and Lumbar Region Mild depletion  Buccal Region Mild depletion  Temple Region Mild depletion  Clavicle Bone Region Severe depletion  Clavicle and Acromion Bone Region Severe depletion  Scapular Bone Region Severe depletion  Dorsal Hand No depletion  [edema]  Patellar Region No depletion  [pitting edema present]  Anterior Thigh Region No depletion  [pitting edema present]  Posterior Calf Region  No depletion  [pitting edema present]  Edema (RD Assessment) Moderate  [hands, BLE]  Hair Reviewed  Eyes Reviewed  Mouth Reviewed  Skin Reviewed  Nails Reviewed    Diet Order:   Diet Order             DIET - DYS 1 Room service appropriate? Yes with Assist; Fluid consistency: Thin  Diet effective now                   EDUCATION  NEEDS:  Not appropriate for education at this time  Skin:  Skin Assessment: Skin Integrity Issues: Skin Integrity Issues:: Unstageable, Other (Comment) Unstageable: lip Other: skin tear, MASD to perineum  Last BM:  2/18 - type 4  Height:  Ht Readings from Last 1 Encounters:  02/09/23 5\' 4"  (1.626 m)    Weight:  Wt Readings from Last 1 Encounters:  02/26/23 65.8 kg    Ideal Body Weight:  54.5 kg  BMI:  Body mass index is 24.9 kg/m.  Estimated Nutritional Needs:  Kcal:  1450-1650 kcals Protein:  70-85 g Fluid:  >/= 1.5 L    Greig Castilla, RD, LDN Registered Dietitian II Please reach out via secure chat Weekend on-call pager # available in [facility]

## 2023-02-26 NOTE — Plan of Care (Signed)
Pt alert, not interactive. Has cortrak to R nare with tube feeds on. CBG checked q4h, stable results. Pt turned q2h. Will continue to monitor pt.  Problem: Clinical Measurements: Goal: Ability to maintain clinical measurements within normal limits will improve Outcome: Progressing   Problem: Activity: Goal: Risk for activity intolerance will decrease Outcome: Progressing   Problem: Nutrition: Goal: Adequate nutrition will be maintained Outcome: Progressing   Problem: Elimination: Goal: Will not experience complications related to bowel motility Outcome: Progressing Goal: Will not experience complications related to urinary retention Outcome: Progressing   Problem: Safety: Goal: Ability to remain free from injury will improve Outcome: Progressing   Problem: Skin Integrity: Goal: Risk for impaired skin integrity will decrease Outcome: Progressing

## 2023-02-26 NOTE — Progress Notes (Signed)
Occupational Therapy Treatment Patient Details Name: Salam Chesterfield MRN: [id number] DOB: 1945/03/12 Today's Date: 02/26/2023   History of present illness Pt is a 78yo female who was found down by daughter and brought to ED where she was noted to have pinpoint pupils, L hemiparesis, and right gaze deviation. She was intubated for airway protection. 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 MRI brain revealed no stroke. 2/10 - one way extubation PMH: Acute CHF, HLD, HTN,  Prosthetic valve dysfunction (07/22/2014), S/P MVR (mitral valve replacement) (04/11/1998), S/P redo mitral valve replacement with bioprosthetic valve (07/25/2014), Stroke, Thrombosis of prosthetic heart valve (07/25/2014), and Valvular endocarditis (2000)   OT comments  Noted patient with increased edema in BUE forearms and hands.  Able to open eyes briefly, but would not engage in any functional activity with OT.  Not progressing toward goals at this time.  Will see one more time to see if patient can progress.       If plan is discharge home, recommend the following:  Two people to help with bathing/dressing/bathroom;Two people to help with walking and/or transfers;Direct supervision/assist for medications management;Direct supervision/assist for financial management;Assist for transportation   Equipment Recommendations       Recommendations for Other Services      Precautions / Restrictions Precautions Precautions: Fall Restrictions Weight Bearing Restrictions Per Provider Order: No       Mobility Bed Mobility                    Transfers                         Balance                                           ADL either performed or assessed with clinical judgement   ADL Overall ADL's : Needs assistance/impaired Eating/Feeding: Total assistance   Grooming: Total assistance                                  General ADL Comments: Overall Total A due to impaired cognition and arousal level    Extremity/Trunk Assessment Upper Extremity Assessment RUE Deficits / Details: Moderate edema noted, Ablle to passively range through shoulder, elbo and hand.  Pain with finger flexion LUE Deficits / Details: performed PROM, no resistance today - Moderate edema forearm and hand.  Pain with finger flexion            Vision       Perception     Praxis     Communication Communication Communication: Impaired Factors Affecting Communication: Difficulty expressing self (She did not verbalize during the session)   Cognition Arousal: Obtunded Behavior During Therapy: Flat affect Cognition: History of cognitive impairments                               Following commands: Impaired Following commands impaired: Follows one step commands inconsistently      Cueing      Exercises      Shoulder Instructions       General Comments      Pertinent Vitals/ Pain  Pain Assessment Faces Pain Scale: Hurts little more Facial Expression: Grimacing Pain Location:  (left hand  ) Pain Descriptors / Indicators: Grimacing  Home Living                                          Prior Functioning/Environment              Frequency  Min 1X/week        Progress Toward Goals  OT Goals(current goals can now be found in the care plan section)     Acute Rehab OT Goals OT Goal Formulation: Patient unable to participate in goal setting Time For Goal Achievement: 03/03/2023 Potential to Achieve Goals: Poor (Plan to see one more time to determine if OT should continue) ADL Goals Pt Will Perform Grooming: with min assist;sitting;bed level Pt Will Perform Upper Body Dressing: with mod assist;sitting;bed level Pt Will Perform Lower Body Dressing: with mod assist;sitting/lateral leans;sit to/from stand  Plan       Co-evaluation      Reason for Co-Treatment: Other (comment) (To address feeding/positioning)          AM-PAC OT "6 Clicks" Daily Activity     Outcome Measure   Help from another person eating meals?: Total Help from another person taking care of personal grooming?: Total Help from another person toileting, which includes using toliet, bedpan, or urinal?: Total Help from another person bathing (including washing, rinsing, drying)?: Total Help from another person to put on and taking off regular upper body clothing?: Total Help from another person to put on and taking off regular lower body clothing?: Total 6 Click Score: 6    End of Session    OT Visit Diagnosis: Muscle weakness (generalized) (M62.81);Other symptoms and signs involving the nervous system (R29.898);Other symptoms and signs involving cognitive function   Activity Tolerance Patient limited by lethargy   Patient Left in bed;with bed alarm set;with call bell/phone within reach   Nurse Communication Mobility status        Time: 2956-2130 OT Time Calculation (min): 47 min  Charges: OT General Charges $OT Visit: 1 Visit OT Treatments $Therapeutic Activity: 38-52 mins  Hal Neer OTR/L   Malachi Bonds 02/26/2023, 1:55 PM

## 2023-02-26 NOTE — Progress Notes (Signed)
  Progress Note   Patient: [name] ZOX:[id number] DOB: 10-16-1945 DOA: 02/07/2023     19 DOS: the patient was seen and examined on 02/26/2023   Brief hospital course: Patient was brought into the hospital after patient was found unresponsive by her daughter.  Patient was bradycardic, hypotensive on initial evaluation, pinpoint pupils, facial droop.  No response to Narcan.  Patient was intubated in the ED for airway protection.  Workup done in the hospital revealed subdural hematoma, RSV infection, waxing and waning of mental status in the setting of vascular dementia.  Patient has been extubated and transferred to [facility].  Palliative care team is following patient.  Cortrak tube was placed, while waiting for patient's family to make decision regarding PEG tube placement or not.   Assessment and Plan: Acute respiratory failure with hypoxia due to RSV infection Wheezing due to RSV   - Pulmicort/brovana bid - Yupelri 175 mcg neb daily  - Duoneb q4 hr PRN     Acute encephalopathy with baseline vascular dementia New-onset seizures SDH - Keppra 500 mg bid via tube - Valproate 325 mg q8hr via tube - Aricept 10 mg PO at bedtime  - Lipitor 40 mg daily    Hypotension -Continue to monitor closely.    Chronic HFrEF History of MVR, redo bioprosthetic AVR with paroxysmal Afib - Monitor    CKD stage 3b versus CKD 3a: - Monitor    Mild thrombocytopenia: -Resolved.   Chronic anemia - Monitor    H/o GERD - IV protonix 40 mg daily    Hypoglycemia -Resolved.   Hypophosphatemia: - Resolved    Hypomagnesemia: - Resolved   Moderate protein energy malnutrition - Cortrack to 2/14 and will start supplemental tube feeds afterward   Deconditioning - PT/OT/SLP as able  Subjective: Pt seen and examined at the bedside. Electrolytes have corrected. Plan remains SNF.  Physical Exam: Vitals:   02/26/23 0320 02/26/23 0457 02/26/23 0802 02/26/23 0805  BP: 108/71   98/61  Pulse:  (!) 114  (!) 103 (!) 103  Resp: 18  (!) 22 (!) 24  Temp: 97.8 F (36.6 C)   98.3 F (36.8 C)  TempSrc:    Axillary  SpO2: 98%  98% 100%  Weight:  65.8 kg    Height:       Constitutional:      Comments: Nonverbal   HENT:     Head: Normocephalic.  Cardiovascular:     Rate and Rhythm: Normal rate.  Pulmonary:     Comments: Decreased air entry with some expiratory wheeze. Abdominal:     Palpations: Abdomen is soft.  Musculoskeletal:     Cervical back: Neck supple.  Skin:    General: Skin is warm.  Neurological:     Comments: Unable to evaluate   Psychiatric:     Comments: Unable to evaluate       Disposition: Status is: Inpatient Remains inpatient appropriate because: Awaiting safe placement  Planned Discharge Destination: Skilled nursing facility    Time spent: 35 minutes  Author: Baron Hamper , MD 02/26/2023 8:48 AM  For on call review [email or web address].

## 2023-02-26 NOTE — Plan of Care (Signed)
 ?  Problem: Elimination: ?Goal: Will not experience complications related to urinary retention ?Outcome: Progressing ?  ?

## 2023-02-27 ENCOUNTER — Inpatient Hospital Stay (HOSPITAL_COMMUNITY): Payer: 59

## 2023-02-27 DIAGNOSIS — N1832 Chronic kidney disease, stage 3b: Secondary | ICD-10-CM | POA: Diagnosis not present

## 2023-02-27 DIAGNOSIS — M7989 Other specified soft tissue disorders: Secondary | ICD-10-CM | POA: Diagnosis not present

## 2023-02-27 DIAGNOSIS — R509 Fever, unspecified: Secondary | ICD-10-CM | POA: Diagnosis not present

## 2023-02-27 DIAGNOSIS — J9601 Acute respiratory failure with hypoxia: Secondary | ICD-10-CM | POA: Diagnosis not present

## 2023-02-27 DIAGNOSIS — Z7189 Other specified counseling: Secondary | ICD-10-CM | POA: Diagnosis not present

## 2023-02-27 DIAGNOSIS — R627 Adult failure to thrive: Secondary | ICD-10-CM | POA: Diagnosis not present

## 2023-02-27 DIAGNOSIS — N179 Acute kidney failure, unspecified: Secondary | ICD-10-CM | POA: Diagnosis not present

## 2023-02-27 DIAGNOSIS — G9340 Encephalopathy, unspecified: Secondary | ICD-10-CM | POA: Diagnosis not present

## 2023-02-27 DIAGNOSIS — Z515 Encounter for palliative care: Secondary | ICD-10-CM | POA: Diagnosis not present

## 2023-02-27 LAB — TROPONIN I (HIGH SENSITIVITY): Troponin I (High Sensitivity): 66 ng/L — ABNORMAL HIGH (ref <18)

## 2023-02-27 LAB — RESPIRATORY PANEL BY PCR

## 2023-02-27 LAB — URINALYSIS, W/ REFLEX TO CULTURE (INFECTION SUSPECTED)
Bacteria, UA: NONE SEEN
Bilirubin Urine: NEGATIVE
Glucose, UA: NEGATIVE mg/dL
Ketones, ur: NEGATIVE mg/dL
Nitrite: NEGATIVE
Protein, ur: >=300 mg/dL — AB
RBC / HPF: >50 RBC/hpf (ref 0–5)
Specific Gravity, Urine: 1.015 (ref 1.005–1.030)
WBC, UA: >50 WBC/hpf (ref 0–5)
pH: 7 (ref 5.0–8.0)

## 2023-02-27 LAB — COMPREHENSIVE METABOLIC PANEL
ALT: 19 U/L (ref 0–44)
AST: 29 U/L (ref 15–41)
Albumin: 2.7 g/dL — ABNORMAL LOW (ref 3.5–5.0)
Alkaline Phosphatase: 87 U/L (ref 38–126)
Anion gap: 8 (ref 5–15)
BUN: 28 mg/dL — ABNORMAL HIGH (ref 8–23)
CO2: 25 mmol/L (ref 22–32)
Calcium: 9.4 mg/dL (ref 8.9–10.3)
Chloride: 105 mmol/L (ref 98–111)
Creatinine, Ser: 1.41 mg/dL — ABNORMAL HIGH (ref 0.44–1.00)
GFR, Estimated: 39 mL/min — ABNORMAL LOW (ref >60)
Glucose, Bld: 136 mg/dL — ABNORMAL HIGH (ref 70–99)
Potassium: 5.3 mmol/L — ABNORMAL HIGH (ref 3.5–5.1)
Sodium: 138 mmol/L (ref 135–145)
Total Bilirubin: 1.6 mg/dL — ABNORMAL HIGH (ref 0.0–1.2)
Total Protein: 6.5 g/dL (ref 6.5–8.1)

## 2023-02-27 LAB — CBC
HCT: 24 % — ABNORMAL LOW (ref 36.0–46.0)
Hemoglobin: 7.2 g/dL — ABNORMAL LOW (ref 12.0–15.0)
MCH: 24.7 pg — ABNORMAL LOW (ref 26.0–34.0)
MCHC: 30 g/dL (ref 30.0–36.0)
MCV: 82.5 fL (ref 80.0–100.0)
Platelets: 179 10*3/uL (ref 150–400)
RBC: 2.91 MIL/uL — ABNORMAL LOW (ref 3.87–5.11)
RDW: 24.4 % — ABNORMAL HIGH (ref 11.5–15.5)
WBC: 12.9 10*3/uL — ABNORMAL HIGH (ref 4.0–10.5)
nRBC: 0.2 % (ref 0.0–0.2)

## 2023-02-27 LAB — GLUCOSE, CAPILLARY
Glucose-Capillary: 139 mg/dL — ABNORMAL HIGH (ref 70–99)
Glucose-Capillary: 123 mg/dL — ABNORMAL HIGH (ref 70–99)
Glucose-Capillary: 153 mg/dL — ABNORMAL HIGH (ref 70–99)
Glucose-Capillary: 126 mg/dL — ABNORMAL HIGH (ref 70–99)
Glucose-Capillary: 131 mg/dL — ABNORMAL HIGH (ref 70–99)

## 2023-02-27 LAB — C-REACTIVE PROTEIN: CRP: 17.9 mg/dL — ABNORMAL HIGH (ref <1.0)

## 2023-02-27 LAB — RESP PANEL BY RT-PCR (RSV, FLU A&B, COVID)  RVPGX2
Influenza A by PCR: NEGATIVE
Influenza B by PCR: NEGATIVE
Resp Syncytial Virus by PCR: NEGATIVE
SARS Coronavirus 2 by RT PCR: NEGATIVE

## 2023-02-27 LAB — MAGNESIUM: Magnesium: 1.6 mg/dL — ABNORMAL LOW (ref 1.7–2.4)

## 2023-02-27 MED ORDER — APIXABAN 5 MG PO TABS
5.0000 mg | ORAL_TABLET | Freq: Two times a day (BID) | ORAL | Status: DC
  Administered 2023-02-27 – 2023-03-01 (×5): 5 mg
  Filled 2023-02-27 (×5): qty 1

## 2023-02-27 MED ORDER — MAGNESIUM SULFATE 2 GM/50ML IV SOLN
2.0000 g | Freq: Once | INTRAVENOUS | Status: AC
  Administered 2023-02-27: 2 g via INTRAVENOUS
  Filled 2023-02-27: qty 50

## 2023-02-27 MED ORDER — SODIUM CHLORIDE 0.9 % IV SOLN: INTRAVENOUS | Status: DC

## 2023-02-27 MED ORDER — APIXABAN 5 MG PO TABS: 5.0000 mg | ORAL_TABLET | Freq: Two times a day (BID) | ORAL | Status: DC

## 2023-02-27 NOTE — Progress Notes (Addendum)
NAME:  [name], MRN:  [id number], DOB:  1945/07/21, LOS: 20 ADMISSION DATE:  02/07/2023, CONSULTATION DATE:  02/07/2023 REFERRING MD: Rubin Payor - EDP, CHIEF COMPLAINT:  AMS, code stroke   History of Present Illness:  Patient was brought into the hospital following being found unresponsive by her daughter, last known well 730 this morning.  She was found when daughter came back from work, difficulty breathing, EMS activated.  Was bradycardic, hypotensive on initial evaluation, pinpoint pupils, facial droop.  No response to Narcan.  Intubated in the ED for airway protection.  Daughter is unclear whether she may have had a seizure.  Had some chest congestion over the last week.  Was her usual self this morning when she was last seen.  She was admitted by PCCM on 1/31.  In the ICU, she was treated for acute respiratory failure secondary to RSV pneumonia.  Her encephalopathy was felt to be multifactorial with seizure/postictal state, RSV infection, subdural hematoma and underlying vascular dementia.  Subsequent to extubation, she has required some suctioning.  She remained somewhat lethargic but was apparently able to phonate/vocalize.  She had dysphagia, which prompted placement of Cortrak.  She also had acute kidney injury, which was suspected to be due to poor oral intake.  She transferred out of ICU on 2/13.  PCCM was asked to see the patient again for hypotension.  Case discussed with the nurse at bedside.  She has been off maintenance fluids asside from D10 infusion which was discontinued.  Prior to my arrival, the patient did get a normal saline bolus (500 mL) which did transiently demonstrate a favorable response with improvement of her MAP to 70s, which was short-lived. The patient also had a new fever earlier this morning.  Given ongoing discussions regarding goals of care and CODE STATUS determination, the patient's clinical deterioration was discussed with the potential need for  vasopressors and central line placement, which were agreeable to the daughter.  Of note, she also informed me that she has just been diagnosed with acute influenza A infection and has been visiting the patient during the past several days.  PAST MEDICAL HISTORY significant for vascular dementia, stroke, atrial fibrillation, mitral valve regurgitation (s/p bioprosthetic valve).  Pertinent  Medical History   Past Medical History:  Diagnosis Date   Acute CHF (congestive heart failure) (HCC) 12/14/2014   Hyperlipidemia    Hypertension    Prosthetic valve dysfunction 07/22/2014   Acute mitral stenosis due to restricted leaflet mobility of bileaflet mechanical mitral valve prosthesis   S/P MVR (mitral valve replacement) 04/11/1998   #29 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  No mass effect, no midline shift.  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  Required intubation for airway protection.  Seen by neurosurgery felt not a candidate for surgery with small acute on chronic subdurals recommended against reversal of warfarin given concern about basilar artery occlusion.  Seen by neurology, EEG ordered, started on Keppra.  Hypotensive, started on norepinephrine 2/1 EEG suggesting moderate to severe diffuse encephalopathy but no  seizure or epileptiform discharges.  Repeat CT head ordered, and showing no new changes 2/2 eeg w areas of increased epileptogenicity + slowing. Off cont sedation. ECHO  w MV gradient change and  decr EF 2/3  + RSV; EEG in place which is showing high risk of epileptogenicity but no active seizures; neuro added valproic acid 2/5 plan to cont through the weekend and re-eval GOC . INR 3.6 not on AC 2/6 recheck INR  2/8 more awake. Met with family. DNR. Discussing possible 1-way extubation 2/10 One-way extubation completed. Tolerated well. 2/11: was signed off on dysphagia diet  2/12: mental status waxes and wanes. Family convo about cortrak possibly for ongoing med needs, supplemental TF 2/13: family agrees to cortrak  .... 2/19 PCCM reconsulted for hypotension. 2/20 MAP > 65 without vasopressors.  Has been getting albumin and maintenance IV fluids.  Modest improving in neurologic function.  At least phonating and says "ow" to pain.  Interim History / Subjective:  Transferred to 56M ICU in the interim.  MAP > 65 without pressors.  Audible upper airway rattle.  On tube feeds.  Making some sounds but not quite verbalizing.  Flu swab not done overnight.  WBC 12.9 today.  Afebrile.  Objective   Blood pressure 94/69, pulse (!) 110, temperature 99.1 F (37.3 C), temperature source Axillary, resp. rate (!) 21, height 5\' 4"  (1.626 m), weight 62.7 kg, SpO2 94%.        Intake/Output Summary (Last 24 hours) at 02/27/2023 0837 Last data filed at 02/27/2023 0600 Gross per 24 hour  Intake 2733.85 ml  Output 750 ml  Net 1983.85 ml   Filed Weights   02/25/23 0500 02/26/23 0457 02/27/23 0459  Weight: 64.5 kg 65.8 kg 62.7 kg   Physical Examination: General: acute on chronically ill appearing, older female, laying in bed, no acute distress  HEENT: anicteric sclera, PERRLA, mucous membranes dry  Neuro: awake but appears drowsy. Looks at provider to verbal command.  Not verbal.  Does not follow commands.  Globally very weak.  Weak cough.  Audible upper airway rattle. CV: s1s2, rrr, no murmur, rub, gallop  PULM: respirations even and unlabored. Productive cough, weak. On 4LNC. NTS PRN GI: rounded,  soft, ntnd Extremities: No significant LE edema noted.  But, B UE swelling without overt signs of IV infiltration or extravasation. Skin: Warm/dry  [facility] Problem List:   Lactic acidosis Recurrent hypoglycemia Possible aspiration pneumonia s/p 5-day Rocephin   Assessment & Plan:  Hypotension/Fever Blood and urine culture.  Flu swab PENDING. Check lactate. Maintenance fluids: NS @ 75 mL/hr. 25% Albumin 25 g IV q 4 hr x 4 doses. B UE U/S.  She is noted to be on warfarin at home (likely for bioprosthetic valve).  Currently, on prophylactic dose of heparin.  AKI on CKD stage 3b, worse Cr 1.25-->1.71, likely due to recurrent prerenal failure.  Monitor I&Os, assess diuresis needs daily Avoid nephrotoxic agents as able Ensure adequate renal perfusion  Acute respiratory failure with hypoxia due to RSV infection Wheezing due to RSV   Continue Brovana/Pulmicort/Yupelri Titrate supplemental O2 for sat >90%  Albuterol as needed. Pulmonary toilet: nasotracheal suctioning.  Mouth care. One way extubation 2/10. Palliative following.  Acute encephalopathy with baseline vascular dementia New-onset seizures SDH STAT head CT for aphasia (2/19) showed no new intracranial process. Continue Keppra/valproate Continue modafinil. Continue Aricept. PT/OT/SLP as below   Chronic HFrEF History of MVR, redo bioprosthetic AVR with paroxysmal Afib heparin for DVT ppx  will need  to restart anticoagulation, likely Eliquis. Need to speak with family about risk/benefit of this.   Mild thrombocytopenia, improving Chronic anemia Check CBC  H/o GERD - PPI  Hypoglycemia Likely from stopping tube feeds. Based swallow eval for dysphagia 1 diet. However, mental status precludes consistent nutrition  -monitor cbg q4h  -can start d5 if needed  -Cortrack to 2/14  Moderate protein energy malnutrition -Cortrack to 14 and will start supplemental tube feeds afterward   Deconditioning -  PT/OT/SLP as able  GOC: Palliative care notes reviewed.  Help appreciated.  Best Practice: (right click and "Reselect all SmartList Selections" daily)   Diet/type: tubefeeds and NPO DVT prophylaxis SCD SQH  Pressure ulcer(s): N/A GI prophylaxis: PPI Lines: N/A Foley:  N/A Code Status:  limited Last date of multidisciplinary goals of care discussion: made DNR 2/10 with one way extubation. Ongoing GOC discussion  Critical care time: - minutes   Marcelle Smiling, MD Board Certified by the ABIM, Pulmonary Diseases & Critical Care Medicine  02/27/23 8:42 AM  Please see [email or web address] for pager details.  From 7A-7P if no response, please call [phone] After hours, please call ELink [phone]

## 2023-02-27 NOTE — Significant Event (Addendum)
U/S B UE shows superficial thrombi in the upper extremities but also with chronic clot in LEFT internal jugular vein.  She also has bioprosthetic valve.  Stop heparin prophylaxis.  Start Eliquis.

## 2023-02-27 NOTE — Progress Notes (Signed)
Physical Therapy Treatment and Discharge Patient Details Name: [name] MRN: [id number] DOB: 1945/08/04 Today's Date: 02/27/2023   History of Present Illness Pt is a 78yo female who was found down by daughter and brought to ED where she was noted to have pinpoint pupils, L hemiparesis, and right gaze deviation. She was intubated for airway protection. 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 MRI brain revealed no stroke. 2/10 - one way extubation PMH: Acute CHF, HLD, HTN,  Prosthetic valve dysfunction (07/22/2014), S/P MVR (mitral valve replacement) (04/11/1998), S/P redo mitral valve replacement with bioprosthetic valve (07/25/2014), Stroke, Thrombosis of prosthetic heart valve (07/25/2014), and Valvular endocarditis (2000)    PT Comments  Patient now moved to ICU for hypotension. Goal MAP>65 which was maintained throughout session. Attempted to arouse pt with only brief eye opening to sternal rub and grimacing with head/neck ROM. Pt allowed PROM x 4 extremities. No contractures noted, although pt with stiffness in most joints. Unfortunately pt did not follow any commands nor was she able to keep her eyes open during session. Patient is being discharged from Physical Therapy due to lack of progress and no reasonable expectation that she can make progress.     If plan is discharge home, recommend the following: Help with stairs or ramp for entrance;Supervision due to cognitive status;Assist for transportation;Two people to help with walking and/or transfers;Assistance with feeding;Direct supervision/assist for medications management;Direct supervision/assist for financial management   Can travel by private vehicle     No  Equipment Recommendations  Wheelchair (measurements PT);Wheelchair cushion (measurements PT);Hoyer lift;Hospital bed    Recommendations for Other Services       Precautions /  Restrictions Precautions Precautions: Fall Restrictions Weight Bearing Restrictions Per Provider Order: No     Mobility  Bed Mobility               General bed mobility comments: deferred due to decr arousal    Transfers                        Ambulation/Gait                   Stairs             Wheelchair Mobility     Tilt Bed    Modified Rankin (Stroke Patients Only)       Balance                                            Communication Communication Communication: Impaired Factors Affecting Communication: Difficulty expressing self (She did not verbalize during the session)  Cognition Arousal: Stuporous Behavior During Therapy: Flat affect   PT - Cognitive impairments: History of cognitive impairments                       PT - Cognition Comments: briefly opens eyes when sternal rub Following commands: Impaired      Cueing Cueing Techniques: Verbal cues, Tactile cues  Exercises Other Exercises Other Exercises: PROM x 4 extremities and head/neck    General Comments General comments (skin integrity, edema, etc.): BP initially 89/77 (82) HR 112; post BP 102/76 HR 112      Pertinent Vitals/Pain Pain Assessment Pain Assessment: Faces Faces Pain Scale: Hurts  little more Breathing: normal Negative Vocalization: none Facial Expression: smiling or inexpressive Body Language: relaxed Consolability: no need to console PAINAD Score: 0 Pain Location:  (neck with ROM) Pain Descriptors / Indicators: Grimacing Pain Intervention(s): Limited activity within patient's tolerance, Repositioned    Home Living                          Prior Function            PT Goals (current goals can now be found in the care plan section) Acute Rehab PT Goals Patient Stated Goal: unable to state PT Goal Formulation: Patient unable to participate in goal setting Time For Goal Achievement:  03/02/2023 Potential to Achieve Goals: Good Progress towards PT goals: Not progressing toward goals - comment (discharge from PT)    Frequency    Min 1X/week      PT Plan      Co-evaluation              AM-PAC PT "6 Clicks" Mobility   Outcome Measure  Help needed turning from your back to your side while in a flat bed without using bedrails?: Total Help needed moving from lying on your back to sitting on the side of a flat bed without using bedrails?: Total Help needed moving to and from a bed to a chair (including a wheelchair)?: Total Help needed standing up from a chair using your arms (e.g., wheelchair or bedside chair)?: Total Help needed to walk in hospital room?: Total Help needed climbing 3-5 steps with a railing? : Total 6 Click Score: 6    End of Session Equipment Utilized During Treatment: Oxygen Activity Tolerance: Patient limited by lethargy Patient left: in bed;with call bell/phone within reach;with bed alarm set Nurse Communication: Other (comment) (discharge from PT) PT Visit Diagnosis: Unsteadiness on feet (R26.81);Muscle weakness (generalized) (M62.81)   Patient is being discharged from PT services secondary to:   Patient has made no progress toward goals in a reasonable time frame.   Please see latest Therapy Progress Note for current level of functioning and progress toward goals.  Progress and discharge plan and discussed with patient/caregiver and they   Patient unable to participate in discharge planning    Time: 1419-1430 PT Time Calculation (min) (ACUTE ONLY): 11 min  Charges:    $Therapeutic Exercise: 8-22 mins PT General Charges $$ ACUTE PT VISIT: 1 Visit                      Jerolyn Center, PT Acute Rehabilitation Services  Office [phone]    Zena Amos 02/27/2023, 2:39 PM

## 2023-02-27 NOTE — Progress Notes (Addendum)
Progress Note   Patient: [name] XBJ:[id number] DOB: 12-25-1945 DOA: 02/07/2023     20 DOS: the patient was seen and examined on 02/27/2023   Brief hospital course: Patient was brought into the hospital after patient was found unresponsive by her daughter.  Patient was bradycardic, hypotensive on initial evaluation, pinpoint pupils, facial droop.  No response to Narcan.  Patient was intubated in the ED for airway protection.  Workup done in the hospital revealed subdural hematoma, RSV infection, waxing and waning of mental status in the setting of vascular dementia.  Patient has been extubated and transferred to [facility].  Palliative care team is following patient.  Cortrak tube was placed, while waiting for patient's family to make decision regarding PEG tube placement or not.    On 02/26/2023 the pt was hypotensive and had a fever. Thus, PCCM/ICU was consulted and the pt was taken back to the ICU.  Assessment and Plan: Acute respiratory failure with hypoxia due to RSV infection Wheezing due to RSV   - Pulmicort/brovana bid - Yupelri 175 mcg neb daily  - Duoneb q4 hr PRN   - Resp panel negative x 4 on 02/27/2023   Acute encephalopathy with baseline vascular dementia New-onset seizures SDH - Keppra 500 mg bid via tube - Valproate 325 mg q8hr via tube - Aricept 10 mg PO at bedtime  - Lipitor 40 mg daily    SIRS from Cystitis (pending finalization of urine cx)  - IV NS 75 cc/hr  - IV albumin 25% 25g q6hr  - IV meropenem 1 g q12   Chronic HFrEF History of MVR, redo bioprosthetic AVR with paroxysmal Afib - Monitor    CKD stage 3b versus CKD 3a: - Monitor    Mild thrombocytopenia: -Resolved.   Chronic anemia - Monitor    H/o GERD - IV protonix 40 mg daily    Hypoglycemia -Resolved.   Hypophosphatemia: - Resolved    Hypomagnesemia: - Resolved   Moderate protein energy malnutrition - Cortrack to 2/14 and will start supplemental tube feeds afterward    Deconditioning - PT/OT/SLP as able  Mucosal Membrane Medical device related Pressure injury present on admission & Pressure injury sacrum stage 2 POA  - Wound care has seen and evaluated the patient   Subjective: Pt seen and examined at the bedside.  Pt remains tachycardic and intermittently hypotensive (but with decent MAPs).  She was transferred to the ICU yesterday evening for closer monitoring. IV fluids and IV albumin ordered by PCCM. Continue with IV antibx.  Physical Exam: Vitals:   02/27/23 0830 02/27/23 0836 02/27/23 1045 02/27/23 1100  BP: 97/74  104/71 (!) 88/70  Pulse: (!) 114  (!) 112 (!) 108  Resp: (!) 23  20 17   Temp:      TempSrc:      SpO2: 100% 94% 98% 97%  Weight:      Height:       Constitutional:      Comments: Nonverbal   HENT:     Head: Normocephalic.  Cardiovascular:     Rate and Rhythm: Tachycardia  Pulmonary:     Comments: Audible rhonchi b/l Abdominal:     Palpations: Abdomen is soft.  Musculoskeletal:     Cervical back: Neck supple.  Skin:    General: Skin is warm.  Neurological:     Comments: Unable to evaluate   Psychiatric:     Comments: Unable to evaluate      Disposition: Status is: Inpatient Remains inpatient  appropriate because: IV fluids/albumin/antibx  Planned Discharge Destination:  Dispo per pt's clinical progress     Time spent: 35 minutes  Author: Baron Hamper , MD 02/27/2023 12:11 PM  For on call review [email or web address].

## 2023-02-27 NOTE — Progress Notes (Signed)
Palliative:  HPI: 78 y.o. female with past medical history of mitral valve replacement, a-fib on warfarin, endocarditis, hypertension, and vascular dementia admitted on 02/07/2023 after being found unresponsive by her daughter. On arrival to the ED, she was noted to have pinpoint pupils, left hemiparesis and right gaze deviation. She was intubated for airway protection. 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 MRI brain revealed no stroke. Patient was given Ativan and loaded with Keppra. Neurosurgery felt that the small size of the subdural hematoma did not warrant surgical intervention. 1/31 intubated for airway protection. 2/3 +RSV. PCCM with ongoing GOC conversations and plans for one way extubation 2/10.  Failure to thrive.   I reviewed records and noted decline and transfer to ICU. I met with [name] today but no family at bedside. She is sleepy and lethargic. RN reports daughter was here this morning and assisted with breakfast. Breakfast tray at bedside with a small amount eaten. PCCM following for hypotension and fever. Unfortunately [name] has not had much progress. I worry about her path forward. No family at bedside at time of my visit. I have had multiple conversations with her children. They have remained hopeful for SNF rehab. They have not made a decision regarding desire for PEG if indicated. I do not believe that she would do well with PEG and would have more risk of complications from procedure than benefits to her. I encouraged family visits for them to assist with intake as much as possible as she has been more receptive to intake from them rather than staff. Unfortunately I do not believe this will be enough to sustain her. Time for outcomes.   Discussed with PCCM and RN.   Exam: Awakens briefly. Falls back asleep with ease. She has inconsistent verbal response - sometimes difficult  to understand. Smiles. No distress. Breathing regular, unlabored. Generalized weakness and fatigue.    Plan:  - DNR/DNI.  - Family struggling with decisions and path forward.  - Palliative available as needed. Following peripherally.   25 min  Yong Channel, NP Palliative Medicine Team Pager [phone] (Please see [email or web address] for schedule) Team Phone [phone]

## 2023-02-27 NOTE — Progress Notes (Signed)
Bilateral upper extremity venous duplex has been completed.  Results can be found in chart review under CV Proc.  02/27/2023 4:18 PM  Fernande Bras, RVT.

## 2023-02-28 DIAGNOSIS — G9340 Encephalopathy, unspecified: Secondary | ICD-10-CM | POA: Diagnosis not present

## 2023-02-28 DIAGNOSIS — J9601 Acute respiratory failure with hypoxia: Secondary | ICD-10-CM | POA: Diagnosis not present

## 2023-02-28 DIAGNOSIS — J690 Pneumonitis due to inhalation of food and vomit: Secondary | ICD-10-CM | POA: Diagnosis not present

## 2023-02-28 DIAGNOSIS — I959 Hypotension, unspecified: Secondary | ICD-10-CM | POA: Diagnosis not present

## 2023-02-28 DIAGNOSIS — J121 Respiratory syncytial virus pneumonia: Secondary | ICD-10-CM

## 2023-02-28 LAB — CBC
HCT: 23.6 % — ABNORMAL LOW (ref 36.0–46.0)
Hemoglobin: 7.1 g/dL — ABNORMAL LOW (ref 12.0–15.0)
MCH: 25.2 pg — ABNORMAL LOW (ref 26.0–34.0)
MCHC: 30.1 g/dL (ref 30.0–36.0)
MCV: 83.7 fL (ref 80.0–100.0)
Platelets: 207 10*3/uL (ref 150–400)
RBC: 2.82 MIL/uL — ABNORMAL LOW (ref 3.87–5.11)
RDW: 25.2 % — ABNORMAL HIGH (ref 11.5–15.5)
WBC: 12.2 10*3/uL — ABNORMAL HIGH (ref 4.0–10.5)
nRBC: 0.5 % — ABNORMAL HIGH (ref 0.0–0.2)

## 2023-02-28 LAB — GLUCOSE, CAPILLARY
Glucose-Capillary: 133 mg/dL — ABNORMAL HIGH (ref 70–99)
Glucose-Capillary: 138 mg/dL — ABNORMAL HIGH (ref 70–99)
Glucose-Capillary: 143 mg/dL — ABNORMAL HIGH (ref 70–99)
Glucose-Capillary: 150 mg/dL — ABNORMAL HIGH (ref 70–99)
Glucose-Capillary: 152 mg/dL — ABNORMAL HIGH (ref 70–99)
Glucose-Capillary: 161 mg/dL — ABNORMAL HIGH (ref 70–99)
Glucose-Capillary: 175 mg/dL — ABNORMAL HIGH (ref 70–99)

## 2023-02-28 LAB — BASIC METABOLIC PANEL
Anion gap: 11 (ref 5–15)
BUN: 31 mg/dL — ABNORMAL HIGH (ref 8–23)
CO2: 23 mmol/L (ref 22–32)
Calcium: 9.7 mg/dL (ref 8.9–10.3)
Chloride: 104 mmol/L (ref 98–111)
Creatinine, Ser: 1.36 mg/dL — ABNORMAL HIGH (ref 0.44–1.00)
GFR, Estimated: 41 mL/min — ABNORMAL LOW (ref >60)
Glucose, Bld: 141 mg/dL — ABNORMAL HIGH (ref 70–99)
Potassium: 5 mmol/L (ref 3.5–5.1)
Sodium: 138 mmol/L (ref 135–145)

## 2023-02-28 LAB — MRSA NEXT GEN BY PCR, NASAL: MRSA by PCR Next Gen: NOT DETECTED

## 2023-02-28 LAB — URINE CULTURE: Culture: 80000 — AB

## 2023-02-28 LAB — VALPROIC ACID LEVEL: Valproic Acid Lvl: 24 ug/mL — ABNORMAL LOW (ref 50.0–100.0)

## 2023-02-28 LAB — PHOSPHORUS: Phosphorus: 3 mg/dL (ref 2.5–4.6)

## 2023-02-28 LAB — MAGNESIUM: Magnesium: 2 mg/dL (ref 1.7–2.4)

## 2023-02-28 MED ORDER — SODIUM CHLORIDE 0.9 % IV SOLN
2.0000 g | Freq: Two times a day (BID) | INTRAVENOUS | Status: DC
  Administered 2023-02-28 – 2023-03-02 (×4): 2 g via INTRAVENOUS
  Filled 2023-02-28 (×5): qty 2

## 2023-02-28 NOTE — Discharge Instructions (Signed)

## 2023-02-28 NOTE — Plan of Care (Signed)
   Problem: Coping: Goal: Level of anxiety will decrease Outcome: Progressing

## 2023-02-28 NOTE — Progress Notes (Signed)
NAME:  [name], MRN:  [id number], DOB:  January 10, 1945, LOS: 21 ADMISSION DATE:  02/07/2023,  CHIEF COMPLAINT:  Hypotension   History of Present Illness:   Patient was brought into the hospital following being found unresponsive by her daughter, last known well 730 this morning.  She was found when daughter came back from work, difficulty breathing, EMS activated.  Was bradycardic, hypotensive on initial evaluation, pinpoint pupils, facial droop.  No response to Narcan.  Intubated in the ED for airway protection.  Daughter is unclear whether she may have had a seizure.  Had some chest congestion over the last week.  Was her usual self this morning when she was last seen.   She was admitted by PCCM on 1/31.  In the ICU, she was treated for acute respiratory failure secondary to RSV pneumonia.  Her encephalopathy was felt to be multifactorial with seizure/postictal state, RSV infection, subdural hematoma and underlying vascular dementia.  Subsequent to extubation, she has required some suctioning.  She remained somewhat lethargic but was apparently able to phonate/vocalize.  She had dysphagia, which prompted placement of Cortrak.  She also had acute kidney injury, which was suspected to be due to poor oral intake.  She transferred out of ICU on 2/13.   PCCM was asked to see the patient again for hypotension.  Case discussed with the nurse at bedside.  She has been off maintenance fluids asside from D10 infusion which was discontinued.  Prior to my arrival, the patient did get a normal saline bolus (500 mL) which did transiently demonstrate a favorable response with improvement of her MAP to 70s, which was short-lived. The patient also had a new fever earlier this morning.  Given ongoing discussions regarding goals of care and CODE STATUS determination, the patient's clinical deterioration was discussed with the potential need for vasopressors and central line placement, which were agreeable to  the daughter.  Of note, she also informed me that she has just been diagnosed with acute influenza A infection and has been visiting the patient during the past several days.  Pertinent  Medical History  -dementia -stroke -afib -MR (s/p mechanical valve with revision into bioprosthetic valve)  Significant Hospital Events: Including procedures, antibiotic start and stop dates in addition to other pertinent events   02/07/2023-MRI-no evidence of acute infarct, right subdural hemorrhage.  No mass effect, no midline shift.  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  Required intubation for airway protection.  Seen by neurosurgery felt not a candidate for surgery with small acute on chronic subdurals recommended against reversal of warfarin given concern about basilar artery occlusion.  Seen by neurology, EEG ordered, started on Keppra.  Hypotensive, started on norepinephrine 2/1 EEG suggesting moderate to severe diffuse encephalopathy but no seizure or epileptiform discharges.  Repeat CT head ordered, and showing no new changes 2/2 eeg w areas of increased epileptogenicity + slowing. Off cont sedation. ECHO  w MV gradient change and decr EF 2/3  + RSV; EEG in place which is showing high risk of epileptogenicity but no active seizures; neuro added valproic acid 2/5 plan to cont through the weekend and re-eval GOC . INR 3.6 not on AC 2/6 recheck INR  2/8 more awake. Met with family. DNR. Discussing possible 1-way extubation 2/10 One-way extubation completed. Tolerated well. 2/11: was signed off on dysphagia diet  2/12: mental status waxes and wanes. Family convo about cortrak possibly for ongoing med needs, supplemental TF 2/13: family  agrees to cortrak  .... 2/19 PCCM reconsulted for hypotension. 2/20 MAP > 65 without vasopressors.  Has been getting albumin and maintenance IV fluids.  Modest improving in neurologic function.  At least phonating and says "ow" to pain. 2/21:  MAP improved, on IV fluids  Interim History / Subjective:  Remains disoriented, but does arouse to verbal stimuli.  Objective   Blood pressure (!) 88/75, pulse (!) 112, temperature 98.3 F (36.8 C), temperature source Axillary, resp. rate (!) 21, height 5\' 4"  (1.626 m), weight 62.6 kg, SpO2 100%.        Intake/Output Summary (Last 24 hours) at 02/28/2023 1446 Last data filed at 02/28/2023 1407 Gross per 24 hour  Intake 3369.65 ml  Output 775 ml  Net 2594.65 ml   Filed Weights   02/26/23 0457 02/27/23 0459 02/28/23 0408  Weight: 65.8 kg 62.7 kg 62.6 kg    Examination: Physical Exam Constitutional:      General: She is not in acute distress.    Appearance: She is ill-appearing.  Cardiovascular:     Rate and Rhythm: Normal rate and regular rhythm.     Pulses: Normal pulses.     Heart sounds: Normal heart sounds.  Pulmonary:     Breath sounds: No wheezing or rhonchi.  Neurological:     Mental Status: She is disoriented.      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 She was also found to be RSV +ve on presentation. Transferred to the floor after extubation but did develop hypotension in the setting of decreased PO intake and likely hypovolemia.  Neuro: Continue AED, valproic acid decreased but this is likely secondar to medication interaction. Stat CT with any mental status change given resumption of anti-coagulation. Continue levetiracetam, donepezil, and  valproic acid. CV: Hypovolemic shock secondary to dehydration. Initiated tube feeds and volume resuscitated with LR. Pulm: did have RSV infection, and is high risk for recurrent aspiration given mental status. Discussed with family that she remains at high risk for aspiration. Currently on nasal cannula and tolerating well. Continue broad spectrum antibiotics for HAP coverage. GI: continue tube feeds, PPI for SUP Renal: AKI is improved Endo: ICU glycemic protoco Hem/Onc: continue apixaban 5 mg bid for history of afib and superficial thrombophlebitis. ID: switch antibiotics to Ceftazidime from Meropenem.  Best Practice (right click and "Reselect all SmartList Selections" daily)   Diet/type: tubefeeds DVT prophylaxis DOAC Pressure ulcer(s): N/A GI prophylaxis: PPI Lines: N/A Foley:  Yes, and it is still needed Code Status:  DNR Last date of multidisciplinary goals of care discussion [02/28/2023]  Labs   CBC: Recent Labs  Lab 02/22/23 0610 02/26/23 1752 02/27/23 0210 02/28/23 0427  WBC 4.4 14.2* 12.9* 12.2*  NEUTROABS 2.8  --   --   --   HGB 8.5* 7.4* 7.2* 7.1*  HCT 28.0* 25.3* 24.0* 23.6*  MCV 78.9* 83.0 82.5 83.7  PLT 187 PLATELET CLUMPS NOTED ON SMEAR, UNABLE TO ESTIMATE 179 207    Basic Metabolic Panel: Recent Labs  Lab 02/23/23 1633 02/24/23 0623 02/25/23 1610  02/26/23 0546 02/26/23 0547 02/27/23 0210 02/28/23 0427  NA  --  140 138  --  136 138 138  K  --  4.9 4.9  --  5.0 5.3* 5.0  CL  --  100 102  --  101 105 104  CO2  --  29 31  --  27 25 23   GLUCOSE  --  122* 158*  --  168* 136* 141*  BUN  --  25* 24*  --  27* 28* 31*  CREATININE  --  1.04* 1.25*  --  1.71* 1.41* 1.36*  CALCIUM  --  8.4* 8.7*  --  8.8* 9.4 9.7  MG 2.0 1.7 1.7 1.8  --  1.6* 2.0  PHOS 2.0* 2.2* 1.1*  --  3.2  --  3.0   GFR: Estimated Creatinine Clearance: 30.9 mL/min (A) (by C-G formula based on SCr of 1.36 mg/dL (H)). Recent Labs  Lab 02/22/23 0610 02/26/23 1751 02/26/23 1752  02/27/23 0210 02/28/23 0427  WBC 4.4  --  14.2* 12.9* 12.2*  LATICACIDVEN  --  1.1  --   --   --     Liver Function Tests: Recent Labs  Lab 02/24/23 0623 02/25/23 0610 02/26/23 0547 02/27/23 0210  AST  --   --   --  29  ALT  --   --   --  19  ALKPHOS  --   --   --  87  BILITOT  --   --   --  1.6*  PROT  --   --   --  6.5  ALBUMIN 1.9* 1.8* 1.9* 2.7*   No results for input(s): "LIPASE", "AMYLASE" in the last 168 hours. No results for input(s): "AMMONIA" in the last 168 hours.  ABG    Component Value Date/Time   PHART 7.357 02/08/2023 1107   PCO2ART 44.5 02/08/2023 1107   PO2ART 138 (H) 02/08/2023 1107   HCO3 25.0 02/08/2023 1107   TCO2 26 02/08/2023 1107   ACIDBASEDEF 1.0 02/08/2023 1107   O2SAT 73.6 02/08/2023 1400     Coagulation Profile: No results for input(s): "INR", "PROTIME" in the last 168 hours.  Cardiac Enzymes: No results for input(s): "CKTOTAL", "CKMB", "CKMBINDEX", "TROPONINI" in the last 168 hours.  HbA1C: Hgb A1c MFr Bld  Date/Time Value Ref Range Status  05/17/2016 09:27 AM 5.5 4.6 - 6.5 % Final    Comment:    Glycemic Control Guidelines for People with Diabetes:Non Diabetic:  <6%Goal of Therapy: <7%Additional Action Suggested:  >8%   02/22/2015 07:07 AM 5.8 (H) 4.8 - 5.6 % Final    Comment:    (NOTE)         Pre-diabetes: 5.7 - 6.4         Diabetes: >6.4         Glycemic control for adults with diabetes: <7.0     CBG: Recent Labs  Lab 02/27/23 1941 02/27/23 2335 02/28/23 0334 02/28/23 0812 02/28/23 1138  GLUCAP 131* 133* 143* 150* 138*    Review of Systems:   N/A  Past Medical History:  She,  has a past medical history of Acute CHF (congestive heart failure) (HCC) (12/14/2014), Hyperlipidemia, Hypertension, Prosthetic valve dysfunction (07/22/2014), S/P MVR (mitral valve replacement) (04/11/1998), S/P redo mitral valve replacement with bioprosthetic valve (07/25/2014), Stroke (HCC), Thrombosis of prosthetic heart valve (07/25/2014),  and Valvular endocarditis (2000).   Surgical History:   Past Surgical History:  Procedure Laterality Date   ABDOMINAL HYSTERECTOMY     CARDIAC CATHETERIZATION  N/A 07/24/2014   Procedure: Right/Left Heart Cath and Coronary Angiography;  Surgeon: Iran Ouch, MD;  Location: [facility] INVASIVE CV LAB;  Service: Cardiovascular;  Laterality: N/A;   CARDIAC CATHETERIZATION N/A 07/22/2014   Procedure: Fluoroscopy Guidance;  Surgeon: Lennette Bihari, MD;  Location: [facility] INVASIVE CV LAB;  Service: Cardiovascular;  Laterality: N/A;   CARDIAC CATHETERIZATION N/A 12/16/2014   Procedure: Right Heart Cath;  Surgeon: Laurey Morale, MD;  Location: [facility]VASIVE CV LAB;  Service: Cardiovascular;  Laterality: N/A;   CARDIAC VALVE REPLACEMENT     MITRAL VALVE REPLACEMENT  04/11/1998   St. Jude mechanical MVR (severe MR, episode of streptococcal bacterial endocarditis - Dr. Cornelius Moras   MITRAL VALVE REPLACEMENT N/A 07/25/2014   Procedure: REDO MITRAL VALVE REPLACEMENT (MVR);  Surgeon: Purcell Nails, MD;  Location: [facility] OR;  Service: Open Heart Surgery;  Laterality: N/A;   TOTAL ABDOMINAL HYSTERECTOMY W/ BILATERAL SALPINGOOPHORECTOMY  12/14/1998   TRANSTHORACIC ECHOCARDIOGRAM  11/2009   EF=>55%; bi-leaflet St. Jude mech prosthesis; mild TR, RVSP elevated at 40-101mmHg, mod pulm HTN; trace AV regurg; mild pulm valve regurg   TUBAL LIGATION       Social History:   reports that she quit smoking about 50 years ago. Her smoking use included cigarettes. She has never used smokeless tobacco. She reports that she does not drink alcohol and does not use drugs.   Family History:  Her family history includes Colon cancer in her father; Diabetes in her father; Heart attack in her mother; Heart failure in her maternal grandfather and maternal grandmother; Lung cancer in her mother; Stroke in her father, maternal grandfather, and sister. There is no history of Lung disease.   Allergies Allergies  Allergen Reactions   Augmentin  [Amoxicillin-Pot Clavulanate] Other (See Comments)    Unknown reaction - Tolerated Cefepime 1/31 Has patient had a PCN reaction causing immediate rash, facial/tongue/throat swelling, SOB or lightheadedness with hypotension Has patient had a PCN reaction causing severe rash involving mucus membranes or skin necrosis Has patient had a PCN reaction that required hospitalization  Has patient had a PCN reaction occurring within the last 10 years] If all of the above answers are "NO", then may proceed with Cephalosporin use.      Home Medications  Prior to Admission medications   Medication Sig Start Date End Date Taking? Authorizing Provider  acetaminophen (TYLENOL) 325 MG tablet Take 2 tablets (650 mg total) by mouth every 6 (six) hours as needed (pain, fever, headache, body aches). 09/09/20  Yes Edwin Dada P, DO  allopurinol (ZYLOPRIM) 100 MG tablet Take 1 tablet (100 mg total) by mouth daily. Take in addition to 300 mg for total daily dose of 400 mg allopurinol. 09/30/22  Yes Myrlene Broker, MD  allopurinol (ZYLOPRIM) 300 MG tablet Take 1 tablet (300 mg total) by mouth daily. 01/14/22  Yes Myrlene Broker, MD  aspirin EC 81 MG tablet Take 1 tablet (81 mg total) by mouth daily. 02/24/15  Yes Jerald Kief, MD  atorvastatin (LIPITOR) 40 MG tablet Take 1 tablet (40 mg total) by mouth daily. 11/18/22  Yes Myrlene Broker, MD  carvedilol (COREG) 3.125 MG tablet Take 1 tablet (3.125 mg total) by mouth 2 (two) times daily with a meal. 08/12/22 08/12/23 Yes Lennette Bihari, MD  donepezil (ARICEPT) 10 MG tablet Take 1 tablet (10 mg total) by mouth at bedtime. 01/14/22  Yes Myrlene Broker, MD  furosemide (LASIX) 20 MG tablet TAKE 1 TABLET BY  MOUTH ONCE A DAY (KEEP APPT) Patient taking differently: Take 20 mg by mouth daily as needed for fluid. 11/26/22 11/26/23 Yes Hilty, Lisette Abu, MD  hydrALAZINE (APRESOLINE) 50 MG tablet Take 1 and 1/2 tablets (75 mg total) by mouth 3 times daily.  07/01/22  Yes Myrlene Broker, MD  isosorbide mononitrate (IMDUR) 30 MG 24 hr tablet Take 1 tablet (30 mg total) by mouth daily. 10/01/22  Yes Hilty, Lisette Abu, MD  pantoprazole (PROTONIX) 40 MG tablet Take 1 tablet (40 mg total) by mouth daily. 10/01/22  Yes Hilty, Lisette Abu, MD  potassium chloride SA (KLOR-CON M) 20 MEQ tablet Take 1 tablet (20 mEq total) by mouth daily. 08/27/22  Yes Myrlene Broker, MD  warfarin (COUMADIN) 2 MG tablet TAKE 1 TO 1 & 1/2 TABLETS BY MOUTH DAILY OR AS DIRECTED BY COUMADIN CLINIC 08/12/22 02/10/23 Yes Hilty, Lisette Abu, MD  Elastic Bandages & Supports (MEDICAL COMPRESSION STOCKINGS) MISC Use daily 10-20 mmHg 03/25/22   Myrlene Broker, MD  predniSONE (DELTASONE) 20 MG tablet Take 2 tablets (40 mg total) by mouth daily with breakfast for 5 days Patient not taking: Reported on 02/10/2023 12/30/22   Myrlene Broker, MD     Critical care time: 34 minutes     Raechel Chute, MD [facility] 02/28/2023 3:12 PM

## 2023-02-28 NOTE — Progress Notes (Signed)
Progress Note   Patient: [name] UJW:[id number] DOB: Jan 01, 1946 DOA: 02/07/2023     21 DOS: the patient was seen and examined on 02/28/2023   Brief hospital course: Patient was brought into the hospital after patient was found unresponsive by her daughter.  Patient was bradycardic, hypotensive on initial evaluation, pinpoint pupils, facial droop.  No response to Narcan.  Patient was intubated in the ED for airway protection.  Workup done in the hospital revealed subdural hematoma, RSV infection, waxing and waning of mental status in the setting of vascular dementia.  Patient has been extubated and transferred to [facility].  Palliative care team is following patient.  Cortrak tube was placed, while waiting for patient's family to make decision regarding PEG tube placement or not.    On 02/26/2023 the pt was hypotensive and had a fever. Thus, PCCM/ICU was consulted and the pt was taken back to the ICU.  Assessment and Plan: Acute respiratory failure with hypoxia due to RSV infection Wheezing due to RSV   - Pulmicort/brovana bid - Yupelri 175 mcg neb daily  - Proventil q4 hr PRN     Acute encephalopathy with baseline vascular dementia New-onset seizures SDH - Keppra 500 mg bid via tube - Valproate 325 mg q8hr via tube - Aricept 10 mg PO at bedtime  - Lipitor 40 mg daily    SIRS from Cystitis (pending finalization of urine cx)  - IV NS 75 cc/hr  - IV meropenem 1 g q12   Chronic HFrEF History of MVR, redo bioprosthetic AVR with paroxysmal Afib - Monitor    CKD stage 3b versus CKD 3a: - Monitor    Mild thrombocytopenia: -Resolved.   Chronic anemia - Monitor (Hgb 7.1 - 7.4)   H/o GERD - IV protonix 40 mg daily    Hypoglycemia -Resolved.   Hypophosphatemia: - Resolved    Hypomagnesemia: - Resolved   Moderate protein energy malnutrition - Cortrack to 2/14 and will start supplemental tube feeds afterward   Deconditioning - PT/OT/SLP as able   Mucosal Membrane  Medical device related Pressure injury present on admission  - Wound care has seen and evaluated the patient   Superficial thrombi in the upper extremities with chronic clot in LEFT internal jugular vein  - Eliquis 5 mg bid        Subjective: Pt seen and examined at the bedside. Eliquis started yesterday evening due to superficial thrombi in upper extremities. Continues on IV fluids and IV antibx's. Prognosis remains guarded.   Physical Exam: Vitals:   02/28/23 0833 02/28/23 0900 02/28/23 1000 02/28/23 1100  BP:  96/74 93/76 122/79  Pulse:  (!) 104 (!) 114 (!) 110  Resp:  (!) 24 (!) 40 (!) 34  Temp:      TempSrc:      SpO2: 100% 100% 100% 98%  Weight:      Height:       Constitutional:      Comments: Nonverbal   HENT:     Head: Normocephalic.  Cardiovascular:     Rate and Rhythm: Tachycardia  Pulmonary:     Comments: Improved aeration b/l Abdominal:     Palpations: Abdomen is soft.  Musculoskeletal:     Cervical back: Neck supple.  Skin:    General: Skin is warm.  Neurological:     Comments: Unable to evaluate   Psychiatric:     Comments: Unable to evaluate       Disposition: Status is: Inpatient Remains inpatient appropriate because:  IV fluids/antibx  Planned Discharge Destination:  Dispo per pt's clinical progress    Time spent: 35 minutes  Author: Baron Hamper , MD 02/28/2023 11:17 AM  For on call review [email or web address].

## 2023-03-01 ENCOUNTER — Inpatient Hospital Stay (HOSPITAL_COMMUNITY): Payer: 59

## 2023-03-01 DIAGNOSIS — E440 Moderate protein-calorie malnutrition: Secondary | ICD-10-CM

## 2023-03-01 DIAGNOSIS — J9601 Acute respiratory failure with hypoxia: Secondary | ICD-10-CM | POA: Diagnosis not present

## 2023-03-01 DIAGNOSIS — S065XAA Traumatic subdural hemorrhage with loss of consciousness status unknown, initial encounter: Secondary | ICD-10-CM | POA: Diagnosis not present

## 2023-03-01 DIAGNOSIS — R4182 Altered mental status, unspecified: Secondary | ICD-10-CM | POA: Diagnosis not present

## 2023-03-01 DIAGNOSIS — G9340 Encephalopathy, unspecified: Secondary | ICD-10-CM | POA: Diagnosis not present

## 2023-03-01 DIAGNOSIS — B338 Other specified viral diseases: Secondary | ICD-10-CM

## 2023-03-01 LAB — GLUCOSE, CAPILLARY
Glucose-Capillary: 151 mg/dL — ABNORMAL HIGH (ref 70–99)
Glucose-Capillary: 153 mg/dL — ABNORMAL HIGH (ref 70–99)
Glucose-Capillary: 123 mg/dL — ABNORMAL HIGH (ref 70–99)
Glucose-Capillary: 139 mg/dL — ABNORMAL HIGH (ref 70–99)
Glucose-Capillary: 127 mg/dL — ABNORMAL HIGH (ref 70–99)
Glucose-Capillary: 149 mg/dL — ABNORMAL HIGH (ref 70–99)

## 2023-03-01 NOTE — Progress Notes (Signed)
 [facility]  PROGRESS NOTE  [name] ZOX:[id number] DOB: [birth year] DOA: 02/07/2023 PCP: Myrlene Broker, MD   Brief HPI:     78 y.o. female with hx of Htn, mitral valve replacement, Afibb on warfarin, prior hx of endocarditis who was found unresponsive by her daughter.   Pt was intubated in ED upon arrival. Initial workup revealing small mixed density R SDH, basilar and L vert occlusion. 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 Patient supratherapeutic on Warfarin for afib, MVR. Was given Kcentra in ED. \  Patient was given Ativan and loaded with Keppra. Neurosurgery felt that the small size of the subdural hematoma did not warrant surgical intervention.  Her encephalopathy was felt to be multifactorial with seizure/postictal state, RSV infection, subdural hematoma and underlying vascular dementia. Subsequent to extubation, she has required some suctioning. She remained somewhat lethargic but was apparently able to phonate/vocalize. She had dysphagia, which prompted placement of Cortrak.   02/07/2023-MRI-no evidence of acute infarct, right subdural hemorrhage.  No mass effect, no midline shift.  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  Required intubation for airway protection.  Seen by neurosurgery felt not a candidate for surgery with small acute on chronic subdurals recommended against reversal of warfarin given concern about basilar artery occlusion.  Seen by neurology, EEG ordered, started on Keppra.  Hypotensive, started on norepinephrine 2/1 EEG suggesting moderate to severe diffuse encephalopathy but no seizure or epileptiform discharges.  Repeat CT head ordered, and showing no new changes 2/2 eeg w areas of increased epileptogenicity + slowing. Off cont sedation. ECHO  w MV gradient change and decr EF 2/3  + RSV; EEG in place which is showing  high risk of epileptogenicity but no active seizures; neuro added valproic acid 2/5 plan to cont through the weekend and re-eval GOC . INR 3.6 not on AC 2/6 recheck INR  2/8 more awake. Met with family. DNR. Discussing possible 1-way extubation 2/10 One-way extubation completed. Tolerated well. 2/11: was signed off on dysphagia diet  2/12: mental status waxes and wanes. Family convo about cortrak possibly for ongoing med needs, supplemental TF 2/13: family agrees to cortrak  .... 2/19 PCCM reconsulted for hypotension. 2/20 MAP > 65 without vasopressors.  Has been getting albumin and maintenance IV fluids.  Modest improving in neurologic function.  At least phonating and says "ow" to pain. 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 She was also found to be RSV +ve on presentation. T  Hypotension/fever/SIRS -Continues to have mild hypotension, tachycardia -Chest x-ray obtained today showed persistent patchy densities in the mid and lower lungs, findings consistent with possible infection. -Continue ceftazidime -Blood cultures x 2 is negative to date  Acute encephalopathy -With baseline vascular dementia, new onset seizures, SDH -Neurosurgery consultation obtained, no intervention recommended as patient has chronic right SDH -Stat head CT for aphasia showed no new  intracranial process. -Continue Keppra, valproate -  Continue   Aricept  Chronic DVT of left internal jugular and superficial thrombophlebitis -Started on apixaban 5 mg p.o. twice daily  History of MVR, redo bioprosthetic AVR with paroxysmal atrial fibrillation -Continue apixaban as above  Acute hypoxemic respiratory failure -RSV infection -Chest x-ray obtained today shows patchy densities in both lung fields -Continue antibiotics, patient is on ceftazidime -Follow blood culture results  Acute kidney injury on CKD stage IIIb -Creatinine is 1.36 today, improved -Avoid nephrotoxins -Follow BMP in a.m.  Normocytic anemia -Likely in setting of chronic illness -Hemoglobin was 7.1 on 2/21 -Follow CBC in a.m., transfuse for Hb less than 7   Goals of care -Palliative care consulted, patient is currently DNR -Family wants full scope of care, hoping for patient to improve to go to rehab  Medications     allopurinol  100 mg Per Tube Daily   apixaban  5 mg Per Tube BID   arformoterol  15 mcg Nebulization BID   atorvastatin  40 mg Per Tube Daily   budesonide (PULMICORT) nebulizer solution  0.5 mg Nebulization BID   Chlorhexidine Gluconate Cloth  6 each Topical Daily   donepezil  10 mg Per Tube QHS   levETIRAcetam  500 mg Per Tube BID   mouth rinse  15 mL Mouth Rinse 4 times per day   pantoprazole (PROTONIX) IV  40 mg Intravenous Q24H   revefenacin  175 mcg Nebulization Daily   valproic acid  325 mg Per Tube Q8H     Data Reviewed:   CBG:  Recent Labs  Lab 02/28/23 1524 02/28/23 1939 02/28/23 2340 03/01/23 0400 03/01/23 0809  GLUCAP 152* 161* 175* 151* 153*    SpO2: 100 % O2 Flow Rate (L/min): 2 L/min FiO2 (%): 28 %    Vitals:   03/01/23 0402 03/01/23 0500 03/01/23 0815 03/01/23 0817  BP:      Pulse:      Resp:      Temp: 98.6 F (37 C)   (!) 97.5 F (36.4 C)  TempSrc: Axillary   Axillary  SpO2:   100%   Weight:  65.1 kg    Height:          Data Reviewed:  Basic Metabolic Panel: Recent Labs  Lab 02/23/23 1633  02/24/23 0623 02/25/23 0610 02/26/23 0546 02/26/23 0547 02/27/23 0210 02/28/23 0427  NA  --  140 138  --  136 138 138  K  --  4.9 4.9  --  5.0 5.3* 5.0  CL  --  100 102  --  101 105 104  CO2  --  29 31  --  27 25 23   GLUCOSE  --  122* 158*  --  168* 136* 141*  BUN  --  25* 24*  --  27* 28* 31*  CREATININE  --  1.04* 1.25*  --  1.71* 1.41* 1.36*  CALCIUM  --  8.4* 8.7*  --  8.8* 9.4 9.7  MG 2.0 1.7 1.7 1.8  --  1.6* 2.0  PHOS 2.0* 2.2* 1.1*  --  3.2  --  3.0    CBC: Recent Labs  Lab 02/26/23 1752 02/27/23 0210 02/28/23 0427  WBC 14.2* 12.9* 12.2*  HGB 7.4* 7.2* 7.1*  HCT 25.3* 24.0* 23.6*  MCV 83.0 82.5 83.7  PLT PLATELET CLUMPS NOTED ON SMEAR, UNABLE TO ESTIMATE 179 207    LFT Recent Labs  Lab 02/24/23 0623 02/25/23 0610 02/26/23 0547 02/27/23 0210  AST  --   --   --  29  ALT  --   --   --  19  ALKPHOS  --   --   --  87  BILITOT  --   --   --  1.6*  PROT  --   --   --  6.5  ALBUMIN 1.9* 1.8* 1.9* 2.7*     Antibiotics: Anti-infectives (From admission, onward)    Start     Dose/Rate Route Frequency Ordered Stop   02/28/23 1700  cefTAZidime (FORTAZ) 2 g in sodium chloride 0.9 % 100 mL IVPB        2 g 200 mL/hr over 30 Minutes Intravenous Every 12 hours 02/28/23 1126     02/26/23 1700  meropenem (MERREM) 1 g in sodium chloride 0.9 % 100 mL IVPB  Status:  Discontinued        1 g 200 mL/hr over 30 Minutes Intravenous Every 12 hours 02/26/23 1620 02/28/23 1126   02/26/23 1545  meropenem (MERREM) 500 mg in sodium chloride 0.9 % 100 mL IVPB  Status:  Discontinued        500 mg 200 mL/hr over 30 Minutes Intravenous Every 8 hours 02/26/23 1452 02/26/23 1619   02/08/23 1600  cefTRIAXone (ROCEPHIN) 2 g in sodium chloride 0.9 % 100 mL IVPB  Status:  Discontinued        2 g 200 mL/hr over 30 Minutes Intravenous Every 24 hours 02/08/23 1453 02/12/23 1009   02/07/23 2100  ceFEPIme (MAXIPIME) 2 g in sodium chloride 0.9 % 100 mL IVPB        2 g 200 mL/hr over 30 Minutes  Intravenous  Once 02/07/23 2050 02/07/23 2208   02/07/23 2100  metroNIDAZOLE (FLAGYL) IVPB 500 mg        500 mg 100 mL/hr over 60 Minutes Intravenous  Once 02/07/23 2050 02/08/23 0038   02/07/23 2100  vancomycin (VANCOCIN) IVPB 1000 mg/200 mL premix        1,000 mg 200 mL/hr over 60 Minutes Intravenous  Once 02/07/23 2050 02/08/23 0217        DVT prophylaxis: Apixaban  Code Status: DNR  Family Communication: No family at bedside   CONSULTS CCM, palliative care, neurosurgery   Subjective   Lethargic, not opening eyes to verbal stimuli.   Objective    Physical Examination:   General: Lethargic, not opening eyes to verbal stimuli Cardiovascular: S1-S2, regular, no murmur auscultated Respiratory: Bilateral rhonchi auscultated Abdomen: Soft, nontender, no organomegaly Extremities: No edema in the lower extremities Neurologic: Lethargic, not following commands   Status is: Inpatient:      Pressure Injury 02/26/23 Sacrum Bilateral;Lower Stage 2 -  Partial thickness loss of dermis presenting as a shallow open injury with a red, pink wound bed without slough. Red, Pink (Active)  02/26/23 1830  Location: Sacrum  Location Orientation: Bilateral;Lower  Staging: Stage 2 -  Partial thickness loss of dermis presenting as a shallow open injury with a red, pink wound bed without slough.  Wound Description (Comments): Red, Pink  Present on Admission: Yes        [name]   [facility] If 7PM-7AM, please contact night-coverage at [email or web address], Office  [phone]   03/01/2023, 9:06 AM  LOS: 22 days

## 2023-03-02 DIAGNOSIS — Z7189 Other specified counseling: Secondary | ICD-10-CM | POA: Diagnosis not present

## 2023-03-02 DIAGNOSIS — G9340 Encephalopathy, unspecified: Secondary | ICD-10-CM | POA: Diagnosis not present

## 2023-03-02 DIAGNOSIS — Z515 Encounter for palliative care: Secondary | ICD-10-CM | POA: Diagnosis not present

## 2023-03-02 LAB — CBC
HCT: 21.7 % — ABNORMAL LOW (ref 36.0–46.0)
Hemoglobin: 6.5 g/dL — CL (ref 12.0–15.0)
MCH: 25.3 pg — ABNORMAL LOW (ref 26.0–34.0)
MCHC: 30 g/dL (ref 30.0–36.0)
MCV: 84.4 fL (ref 80.0–100.0)
Platelets: 231 10*3/uL (ref 150–400)
RBC: 2.57 MIL/uL — ABNORMAL LOW (ref 3.87–5.11)
RDW: 25.2 % — ABNORMAL HIGH (ref 11.5–15.5)
WBC: 8.5 10*3/uL (ref 4.0–10.5)
nRBC: 1.5 % — ABNORMAL HIGH (ref 0.0–0.2)

## 2023-03-02 LAB — URINE CULTURE: Culture: NO GROWTH

## 2023-03-02 LAB — GLUCOSE, CAPILLARY
Glucose-Capillary: 143 mg/dL — ABNORMAL HIGH (ref 70–99)
Glucose-Capillary: 152 mg/dL — ABNORMAL HIGH (ref 70–99)
Glucose-Capillary: 155 mg/dL — ABNORMAL HIGH (ref 70–99)

## 2023-03-02 LAB — COMPREHENSIVE METABOLIC PANEL
ALT: 17 U/L (ref 0–44)
AST: 24 U/L (ref 15–41)
Albumin: 2.2 g/dL — ABNORMAL LOW (ref 3.5–5.0)
Alkaline Phosphatase: 84 U/L (ref 38–126)
Anion gap: 9 (ref 5–15)
BUN: 44 mg/dL — ABNORMAL HIGH (ref 8–23)
CO2: 25 mmol/L (ref 22–32)
Calcium: 9.4 mg/dL (ref 8.9–10.3)
Chloride: 105 mmol/L (ref 98–111)
Creatinine, Ser: 1.6 mg/dL — ABNORMAL HIGH (ref 0.44–1.00)
GFR, Estimated: 33 mL/min — ABNORMAL LOW (ref >60)
Glucose, Bld: 160 mg/dL — ABNORMAL HIGH (ref 70–99)
Potassium: 5.8 mmol/L — ABNORMAL HIGH (ref 3.5–5.1)
Sodium: 139 mmol/L (ref 135–145)
Total Bilirubin: 1.1 mg/dL (ref 0.0–1.2)
Total Protein: 6.2 g/dL — ABNORMAL LOW (ref 6.5–8.1)

## 2023-03-02 LAB — PREPARE RBC (CROSSMATCH)

## 2023-03-02 MED ORDER — SODIUM CHLORIDE 0.9 % IV SOLN
2.0000 g | INTRAVENOUS | Status: DC
  Administered 2023-03-03: 2 g via INTRAVENOUS
  Filled 2023-03-02: qty 2

## 2023-03-02 MED ORDER — SODIUM CHLORIDE 0.9% IV SOLUTION
Freq: Once | INTRAVENOUS | Status: DC
Start: 2023-03-02 — End: 2023-03-04

## 2023-03-02 MED ORDER — SODIUM ZIRCONIUM CYCLOSILICATE 10 G PO PACK
10.0000 g | PACK | Freq: Once | ORAL | Status: AC
  Administered 2023-03-02: 10 g via ORAL
  Filled 2023-03-02: qty 1

## 2023-03-02 NOTE — Progress Notes (Signed)
 Daily Progress Note   Patient Name: [name]       Date: 03/02/2023 DOB: 11/04/1945  Age: 78 y.o. MRN#: [id number] Attending Physician: Meredeth Ide, MD Primary Care Physician: Myrlene Broker, MD Admit Date: 02/07/2023  Reason for Consultation/Follow-up: Establishing goals of care  Length of Stay: 23  Current Medications: Scheduled Meds:   sodium chloride   Intravenous Once   allopurinol  100 mg Per Tube Daily   arformoterol  15 mcg Nebulization BID   atorvastatin  40 mg Per Tube Daily   budesonide (PULMICORT) nebulizer solution  0.5 mg Nebulization BID   Chlorhexidine Gluconate Cloth  6 each Topical Daily   donepezil  10 mg Per Tube QHS   levETIRAcetam  500 mg Per Tube BID   mouth rinse  15 mL Mouth Rinse 4 times per day   pantoprazole (PROTONIX) IV  40 mg Intravenous Q24H   revefenacin  175 mcg Nebulization Daily   sodium zirconium cyclosilicate  10 g Oral Once   valproic acid  325 mg Per Tube Q8H    Continuous Infusions:  cefTAZidime (FORTAZ)  IV 2 g (03/02/23 0545)   feeding supplement (OSMOLITE 1.2 CAL) 55 mL/hr at 03/02/23 0600    PRN Meds: albuterol, docusate, Gerhardt's butt cream, glycopyrrolate, mouth rinse, polyethylene glycol  Physical Exam Vitals reviewed.  Constitutional:      General: She is sleeping. She is not in acute distress.    Appearance: She is ill-appearing.     Interventions: Nasal cannula in place.     Comments: cortrak  Cardiovascular:     Rate and Rhythm: Normal rate.  Pulmonary:     Effort: Tachypnea present.             Vital Signs: BP 93/66   Pulse 94   Temp 98.9 F (37.2 C) (Axillary)   Resp (!) 23   Ht 5\' 4"  (1.626 m)   Wt 65.4 kg   LMP  (LMP Unknown)   SpO2 100%   BMI 24.75 kg/m  SpO2: SpO2: 100 % O2  Device: O2 Device: Nasal Cannula O2 Flow Rate: O2 Flow Rate (L/min): 4 L/min     Patient Active Problem List   Diagnosis Date Noted   Malnutrition of moderate degree 02/26/2023   Dysphagia 02/20/2023   Acute encephalopathy  02/19/2023   AKI (acute kidney injury) (HCC) 02/19/2023   Vascular dementia (HCC) 02/15/2023   DNR (do not resuscitate) 02/15/2023   Goals of care, counseling/discussion 02/15/2023   On mechanically assisted ventilation (HCC) 02/15/2023   RSV (acute bronchiolitis due to respiratory syncytial virus) 02/15/2023   RSV infection 02/13/2023   Warfarin toxicity, accidental or unintentional, initial encounter 02/11/2023   RSV (respiratory syncytial virus pneumonia) 02/10/2023   Altered mental status 02/10/2023   Subdural hematoma (HCC) 02/09/2023   Hypotension 02/09/2023   Acute respiratory failure (HCC) 02/09/2023   Encephalopathy acute 02/07/2023   Submandibular lymphadenopathy 01/20/2023   Encounter for general adult medical examination with abnormal findings 10/27/2020   Neck pain 07/05/2017   Seasonal allergic rhinitis 10/04/2016   Chronic gout 08/03/2016   Vascular dementia, uncomplicated (HCC) 05/17/2016   Chest congestion 05/06/2016   S/P mitral valve replacement with bioprosthetic valve 04/25/2015   Essential hypertension 04/25/2015   Chronic anticoagulation 04/25/2015   Intracranial vascular stenosis    Hyperlipidemia    Chronic systolic CHF (congestive heart failure) (HCC) 01/04/2015   CKD (chronic kidney disease) stage 3, GFR 30-59 ml/min (HCC) 01/04/2015   Atrial fibrillation (HCC) 09/22/2014   RBBB 03/11/2014    Palliative Care Assessment & Plan   Patient Profile: 78 y.o. female  with past medical history of mitral valve replacement, a-fib on warfarin, endocarditis, hypertension, and vascular dementia  admitted on 02/07/2023 after being found unresponsive by her daughter.    On arrival to the ED, she was noted to have pinpoint pupils, left  hemiparesis and right gaze deviation. She was intubated for airway protection. 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 MRI brain revealed no stroke. Patient was given Ativan and loaded with Keppra. Neurosurgery felt that the small size of the subdural hematoma did not warrant surgical intervention. 1/31 intubated for airway protection. 2/3 +RSV. PCCM with ongoing GOC conversations and plans for one way extubation 2/10.   Admitted to ICU 02/26/23.  Patient's family face treatment option decisions, advanced directive decisions, and anticipatory care needs.  Today's Discussion: Patient was lying in bed awake in NAD. She does not respond when I approach her or touch her hand. Son at bedside but he was on a phone call and  left room before we could talk.  0945: Spoke with patient's daughter [name] by phone. Her brother had just called her and relayed his conversation with Dr. Sharl Ma today. [name] and I discuss that we would continue current therapies a few days to see if there is any significant improvement. If not, it may be time to consider comfort measures. We discussed that despite the best efforts from both the medical team and the family the patient has not improved. We discussed the patient's lethargy, poor appetite, and the use of cortrak for nutrition at this time. We also discussed the futility of PEG tube placement.   We discussed what comfort measures would look like for this patient. We discussed that the patient would no longer receive aggressive medical interventions such as continuous vital signs, lab work, radiology testing, or medications not focused on comfort. All care would focus on how the patient is looking and feeling. This would include management of any symptoms that may cause discomfort, pain, shortness of breath, cough, nausea, agitation, anxiety, and/or secretions etc. Symptoms would  be managed with medications and other non-pharmacological interventions. We discussed hospice philosophy and hospice settings. Family  verbalized understanding and appreciation. This is a lot for [name] to hear. Emotional support provided. [name] will be visiting later this afternoon. I will see her then if I am available.  Encouraged family to call PMT with questions or concerns. PMT to follow up Tuesday.  Recommendations/Plan: DNR Limited scope of treatment Consider comfort measures if no significant improvement in next few days PMT to support   Code Status:    Code Status Orders  (From admission, onward)           Start     Ordered   02/15/23 1305  Do not attempt resuscitation (DNR)- Limited -Do Not Intubate (DNI)  (Code Status)  Continuous       Question Answer Comment  If pulseless and not breathing No CPR or chest compressions.   In Pre-Arrest Conditions (Patient Is Breathing and Has A Pulse) Do not intubate. Provide all appropriate non-invasive medical interventions. Avoid ICU transfer unless indicated or required.   Consent: Discussion documented in EHR or advanced directives reviewed      02/15/23 1304            Extensive chart review has been completed prior to seeing the patient including labs, vital signs, imaging, progress/consult notes, orders, medications, and available advance directive documents.  Time spent: 50 minutes  Care plan was discussed with   Thank you for allowing the Palliative Medicine Team to assist in the care of this patient.    Sherryll Burger, NP  Please contact Palliative Medicine Team phone at [phone] for questions and concerns.

## 2023-03-02 NOTE — Progress Notes (Signed)
 [facility]  PROGRESS NOTE  [name] MWU:[id number] DOB: Jul 31, 1945 DOA: 02/07/2023 PCP: Myrlene Broker, MD   Brief HPI:     78 y.o. female with hx of Htn, mitral valve replacement, Afibb on warfarin, prior hx of endocarditis who was found unresponsive by her daughter.   Pt was intubated in ED upon arrival. Initial workup revealing small mixed density R SDH, basilar and L vert occlusion. 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 Patient supratherapeutic on Warfarin for afib, MVR. Was given Kcentra in ED. \  Patient was given Ativan and loaded with Keppra. Neurosurgery felt that the small size of the subdural hematoma did not warrant surgical intervention.  Her encephalopathy was felt to be multifactorial with seizure/postictal state, RSV infection, subdural hematoma and underlying vascular dementia. Subsequent to extubation, she has required some suctioning. She remained somewhat lethargic but was apparently able to phonate/vocalize. She had dysphagia, which prompted placement of Cortrak.   02/07/2023-MRI-no evidence of acute infarct, right subdural hemorrhage.  No mass effect, no midline shift.  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  Required intubation for airway protection.  Seen by neurosurgery felt not a candidate for surgery with small acute on chronic subdurals recommended against reversal of warfarin given concern about basilar artery occlusion.  Seen by neurology, EEG ordered, started on Keppra.  Hypotensive, started on norepinephrine 2/1 EEG suggesting moderate to severe diffuse encephalopathy but no seizure or epileptiform discharges.  Repeat CT head ordered, and showing no new changes 2/2 eeg w areas of increased epileptogenicity + slowing. Off cont sedation. ECHO  w MV gradient change and decr EF 2/3  + RSV; EEG in place which is showing  high risk of epileptogenicity but no active seizures; neuro added valproic acid 2/5 plan to cont through the weekend and re-eval GOC . INR 3.6 not on AC 2/6 recheck INR  2/8 more awake. Met with family. DNR. Discussing possible 1-way extubation 2/10 One-way extubation completed. Tolerated well. 2/11: was signed off on dysphagia diet  2/12: mental status waxes and wanes. Family convo about cortrak possibly for ongoing med needs, supplemental TF 2/13: family agrees to cortrak  .... 2/19 PCCM reconsulted for hypotension. 2/20 MAP > 65 without vasopressors.  Has been getting albumin and maintenance IV fluids.  Modest improving in neurologic function.  At least phonating and says "ow" to pain. 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 She was also found to be RSV +ve on presentation. T  Hypotension/fever/SIRS -Resolved -Chest x-ray obtained today showed persistent patchy densities in the mid and lower lungs, findings consistent with possible infection. -Continue ceftazidime -Blood cultures x 2 is negative to date  Acute encephalopathy -No significant improvement -With baseline vascular dementia, new onset seizures, SDH -Neurosurgery consultation obtained, no intervention recommended as patient has chronic right SDH -Stat head CT for aphasia showed no new  intracranial process. -Continue Keppra, valproate -Continue  Aricept  Chronic DVT  of left internal jugular and superficial thrombophlebitis -Started on apixaban 5 mg p.o. twice daily -Apixaban will be discontinued as patient has developed anemia with hemoglobin 6.5 today  History of MVR, redo bioprosthetic AVR with paroxysmal atrial fibrillation -Was started on apixaban, currently on hold due to developing anemia  Acute hypoxemic respiratory failure -RSV infection -Chest x-ray obtained today shows patchy densities in both lung fields -Continue antibiotics, patient is on ceftazidime -Follow blood culture results  Acute kidney injury on CKD stage IIIb -Creatinine is 1.60 today -Avoid nephrotoxins -Follow BMP in a.m.  Normocytic anemia -Likely in setting of chronic illness -?  Acute blood loss anemia -Hemoglobin was 7.1 on 2/21; dropped to 6.5 today -1 unit PRBC ordered -Check stool for occult blood -Follow CBC in a.m., transfuse for Hb less than 7  Hyperkalemia -Potassium is 5.8 -Will give 1 dose of Lokelma 10 g x 1 -Follow serum potassium in a.m.   Goals of care -Palliative care consulted, patient is currently DNR -Discussed in detail with patient's son at bedside.  Explained that currently patient is on core track feeding tube which is just a temporary measure, if she does not start eating in next few days, putting a PEG tube will not be in her best interest.  Family is not interested in putting PEG tube.  Palliative care following.  Medications     sodium chloride   Intravenous Once   allopurinol  100 mg Per Tube Daily   apixaban  5 mg Per Tube BID   arformoterol  15 mcg Nebulization BID   atorvastatin  40 mg Per Tube Daily   budesonide (PULMICORT) nebulizer solution  0.5 mg Nebulization BID   Chlorhexidine Gluconate Cloth  6 each Topical Daily   donepezil  10 mg Per Tube QHS   levETIRAcetam  500 mg Per Tube BID   mouth rinse  15 mL Mouth Rinse 4 times per day   pantoprazole (PROTONIX) IV  40 mg Intravenous Q24H   revefenacin  175 mcg Nebulization  Daily   sodium zirconium cyclosilicate  10 g Oral Once   valproic acid  325 mg Per Tube Q8H     Data Reviewed:   CBG:  Recent Labs  Lab 03/01/23 1155 03/01/23 1644 03/01/23 1931 03/01/23 2321 03/02/23 0320  GLUCAP 123* 139* 127* 149* 143*    SpO2: 100 % O2 Flow Rate (L/min): 4 L/min FiO2 (%): 28 %    Vitals:   03/02/23 0320 03/02/23 0400 03/02/23 0500 03/02/23 0600  BP:  (!) 85/75 (!) 88/69 93/66  Pulse:  (!) 107 98 94  Resp:  (!) 26 (!) 24 (!) 23  Temp: 98.9 F (37.2 C)     TempSrc: Axillary     SpO2:  93% 97% 100%  Weight:   65.4 kg   Height:          Data Reviewed:  Basic Metabolic Panel: Recent Labs  Lab 02/23/23 1633 02/23/23 1633 02/24/23 0623 02/25/23 0610 02/26/23 0546 02/26/23 0547 02/27/23 0210 02/28/23 0427 03/02/23 0232  NA  --    < > 140 138  --  136 138 138 139  K  --    < > 4.9 4.9  --  5.0 5.3* 5.0 5.8*  CL  --    < > 100 102  --  101 105 104 105  CO2  --    < > 29 31  --  27 25 23 25   GLUCOSE  --    < >  122* 158*  --  168* 136* 141* 160*  BUN  --    < > 25* 24*  --  27* 28* 31* 44*  CREATININE  --    < > 1.04* 1.25*  --  1.71* 1.41* 1.36* 1.60*  CALCIUM  --    < > 8.4* 8.7*  --  8.8* 9.4 9.7 9.4  MG 2.0  --  1.7 1.7 1.8  --  1.6* 2.0  --   PHOS 2.0*  --  2.2* 1.1*  --  3.2  --  3.0  --    < > = values in this interval not displayed.    CBC: Recent Labs  Lab 02/26/23 1752 02/27/23 0210 02/28/23 0427 03/02/23 0232  WBC 14.2* 12.9* 12.2* 8.5  HGB 7.4* 7.2* 7.1* 6.5*  HCT 25.3* 24.0* 23.6* 21.7*  MCV 83.0 82.5 83.7 84.4  PLT PLATELET CLUMPS NOTED ON SMEAR, UNABLE TO ESTIMATE 179 207 231    LFT Recent Labs  Lab 02/24/23 0623 02/25/23 0610 02/26/23 0547 02/27/23 0210 03/02/23 0232  AST  --   --   --  29 24  ALT  --   --   --  19 17  ALKPHOS  --   --   --  87 84  BILITOT  --   --   --  1.6* 1.1  PROT  --   --   --  6.5 6.2*  ALBUMIN 1.9* 1.8* 1.9* 2.7* 2.2*     Antibiotics: Anti-infectives (From admission,  onward)    Start     Dose/Rate Route Frequency Ordered Stop   02/28/23 1700  cefTAZidime (FORTAZ) 2 g in sodium chloride 0.9 % 100 mL IVPB        2 g 200 mL/hr over 30 Minutes Intravenous Every 12 hours 02/28/23 1126     02/26/23 1700  meropenem (MERREM) 1 g in sodium chloride 0.9 % 100 mL IVPB  Status:  Discontinued        1 g 200 mL/hr over 30 Minutes Intravenous Every 12 hours 02/26/23 1620 02/28/23 1126   02/26/23 1545  meropenem (MERREM) 500 mg in sodium chloride 0.9 % 100 mL IVPB  Status:  Discontinued        500 mg 200 mL/hr over 30 Minutes Intravenous Every 8 hours 02/26/23 1452 02/26/23 1619   02/08/23 1600  cefTRIAXone (ROCEPHIN) 2 g in sodium chloride 0.9 % 100 mL IVPB  Status:  Discontinued        2 g 200 mL/hr over 30 Minutes Intravenous Every 24 hours 02/08/23 1453 02/12/23 1009   02/07/23 2100  ceFEPIme (MAXIPIME) 2 g in sodium chloride 0.9 % 100 mL IVPB        2 g 200 mL/hr over 30 Minutes Intravenous  Once 02/07/23 2050 02/07/23 2208   02/07/23 2100  metroNIDAZOLE (FLAGYL) IVPB 500 mg        500 mg 100 mL/hr over 60 Minutes Intravenous  Once 02/07/23 2050 02/08/23 0038   02/07/23 2100  vancomycin (VANCOCIN) IVPB 1000 mg/200 mL premix        1,000 mg 200 mL/hr over 60 Minutes Intravenous  Once 02/07/23 2050 02/08/23 0217        DVT prophylaxis: Apixaban  Code Status: DNR  Family Communication: No family at bedside   CONSULTS CCM, palliative care, neurosurgery   Subjective   Continues to be nonverbal.  Hemoglobin dropped to 6.5 this morning   Objective    Physical Examination:  Appears in no acute distress, core track feeding tube in place S1-S2, regular Lungs clear to auscultation bilaterally Abdomen is soft, nontender 2+ edema in bilateral upper extremities, trace edema in the lower extremities   Status is: Inpatient:      Pressure Injury 02/26/23 Sacrum Bilateral;Lower Stage 2 -  Partial thickness loss of dermis presenting as a shallow  open injury with a red, pink wound bed without slough. Red, Pink (Active)  02/26/23 1830  Location: Sacrum  Location Orientation: Bilateral;Lower  Staging: Stage 2 -  Partial thickness loss of dermis presenting as a shallow open injury with a red, pink wound bed without slough.  Wound Description (Comments): Red, Pink  Present on Admission: Yes        Courtni Balash S Jonnae Fonseca   [facility] If 7PM-7AM, please contact night-coverage at [email or web address], Office  [phone]   03/02/2023, 8:36 AM  LOS: 23 days

## 2023-03-02 NOTE — Progress Notes (Signed)
 PHARMACY NOTE:  ANTIMICROBIAL RENAL DOSAGE ADJUSTMENT  Current antimicrobial regimen includes a mismatch between antimicrobial dosage and estimated renal function.  As per policy approved by the Pharmacy & Therapeutics and Medical Executive Committees, the antimicrobial dosage will be adjusted accordingly.  Current antimicrobial dosage:  Fortaz 2g IV q12h  Indication: pneumonia  Renal Function:  Estimated Creatinine Clearance: 26.2 mL/min (A) (by C-G formula based on SCr of 1.6 mg/dL (H)). []      On intermittent HD, scheduled: []      On CRRT    Antimicrobial dosage has been changed to:  Fortaz 2g IV every 24 hours  Additional comments:   Thank you for allowing pharmacy to be a part of this patient's care.  Link Snuffer, PharmD, BCPS, BCCCP Please refer to [facility] for [facility] numbers 03/02/2023 3:58 PM

## 2023-03-02 NOTE — Evaluation (Signed)
 Clinical/Bedside Swallow Evaluation Patient Details  Name: [name] MRN: [id number] Date of Birth: 07/06/1945  Today's Date: 03/02/2023 Time: SLP Start Time (ACUTE ONLY): 0950 SLP Stop Time (ACUTE ONLY): 1003 SLP Time Calculation (min) (ACUTE ONLY): 13 min  Past Medical History:  Past Medical History:  Diagnosis Date   Acute CHF (congestive heart failure) (HCC) 12/14/2014   Hyperlipidemia    Hypertension    Prosthetic valve dysfunction 07/22/2014   Acute mitral stenosis due to restricted leaflet mobility of bileaflet mechanical mitral valve prosthesis   S/P MVR (mitral valve replacement) 04/11/1998   #29 St. Jude bileaflet mechanical valve for bacterial endocarditis   S/P redo mitral valve replacement with bioprosthetic valve 07/25/2014   27 mm [facility] Mitral bovine bioprosthetic tissue valve   Stroke (HCC)    Thrombosis of prosthetic heart valve 07/25/2014   Valvular endocarditis 2000   Streptococcus   Past Surgical History:  Past Surgical History:  Procedure Laterality Date   ABDOMINAL HYSTERECTOMY     CARDIAC CATHETERIZATION N/A 07/24/2014   Procedure: Right/Left Heart Cath and Coronary Angiography;  Surgeon: Iran Ouch, MD;  Location: [facility] INVASIVE CV LAB;  Service: Cardiovascular;  Laterality: N/A;   CARDIAC CATHETERIZATION N/A 07/22/2014   Procedure: Fluoroscopy Guidance;  Surgeon: Lennette Bihari, MD;  Location: [facility] INVASIVE CV LAB;  Service: Cardiovascular;  Laterality: N/A;   CARDIAC CATHETERIZATION N/A 12/16/2014   Procedure: Right Heart Cath;  Surgeon: Laurey Morale, MD;  Location: [facility] INVASIVE CV LAB;  Service: Cardiovascular;  Laterality: N/A;   CARDIAC VALVE REPLACEMENT     MITRAL VALVE REPLACEMENT  04/11/1998   St. Jude mechanical MVR (severe MR, episode of streptococcal bacterial endocarditis - Dr. Cornelius Moras   MITRAL VALVE REPLACEMENT N/A 07/25/2014   Procedure: REDO MITRAL VALVE REPLACEMENT (MVR);  Surgeon: Purcell Nails, MD;  Location: [facility] OR;  Service:  Open Heart Surgery;  Laterality: N/A;   TOTAL ABDOMINAL HYSTERECTOMY W/ BILATERAL SALPINGOOPHORECTOMY  12/14/1998   TRANSTHORACIC ECHOCARDIOGRAM  11/2009   EF=>55%; bi-leaflet St. Jude mech prosthesis; mild TR, RVSP elevated at 40-69mmHg, mod pulm HTN; trace AV regurg; mild pulm valve regurg   TUBAL LIGATION     HPI:  Pt is a 78 yo female who was found down by daughter and brought to ED 1/31 where she was noted to have pinpoint pupils, L hemiparesis, and right gaze deviation. She was intubated for airway protection. 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 MRI brain revealed no stroke. 2/10 - one way extubation (ETT ten days). PMH: dementia with memory loss but  functional soical communication with family; acute CHF, HLD, HTN,  Prosthetic valve dysfunction (07/22/2014), S/P MVR (mitral valve replacement) (04/11/1998), S/P redo mitral valve replacement with bioprosthetic valve (07/25/2014), Stroke, Thrombosis of prosthetic heart valve (07/25/2014), and Valvular endocarditis (2000). Pt had an MBS on 2/11 that indicated a functional swallow with adequate pharyngeal squeezing. Diet rec was dys 1/thin liquid with no trials of solids due to impaired mentation.    Assessment / Plan / Recommendation  Clinical Impression  Pt seen for skilled ST evaluation to determine safest diet recs. Pt initially encountered with her nurse present. Per nursing reports, the pt has had severely reduced PO intake poor and continued lethargy. In addition, nursing reports a persistent rattle while breathing for the past two days- this was also observed by the SLP. The pt was given max verbal cues and sternal  rubs to rouse to the SLP. The pt had no arousal given these attempts. The pt was given a tactile cue via ice chips on the lips, the pt responded to this by opening her mouth and taking the ice chip in her oral cavity. The pt had poor labial closure  with mild-moderate loss of the bolus. The pt made minimal attempts to masticate or manipulate the ice chip and did not open her eyes t/o the trial. The pt was given another ice chip on the lip alongside max verbal cues and sternal rubbing, the pt still did not open her eyes but attempted to take the ice chip for PO consumption. The pt had no immediate signs/symptoms of aspiration BUT had a delayed (approx. two minutes later) weak throat clearance and several weak and wet coughs, with continued rattled breathing t/o the evaluation. SLP attempted to suction, pt unable to cognitively follow directions.   Given the pt's severe lethargy and signs/symptoms of aspiration, safest diet rec at this time is NPO with her nutritional /mediation needs met via cortak. Frequent oral care as pt permits recommended. Pt not aroused enough for MBS at this time, SLP to f/u closely.  SLP Visit Diagnosis: Dysphagia, oropharyngeal phase (R13.12)    Aspiration Risk  Severe aspiration risk;Risk for inadequate nutrition/hydration    Diet Recommendation NPO;Alternative means - temporary         Other  Recommendations Oral Care Recommendations: Oral care BID Caregiver Recommendations: Have oral suction available    Recommendations for follow up therapy are one component of a multi-disciplinary discharge planning process, led by the attending physician.  Recommendations may be updated based on patient status, additional functional criteria and insurance authorization.     Assistance Recommended at Discharge    Functional Status Assessment Patient has had a recent decline in their functional status and/or demonstrates limited ability to make significant improvements in function in a reasonable and predictable amount of time  Frequency and Duration min 2x/week  2 weeks       Prognosis Prognosis for improved oropharyngeal function: Guarded Barriers to Reach Goals: Cognitive deficits;Severity of deficits      Swallow  Study   General Date of Onset: 02/07/23 HPI: Pt is a 78 yo female who was found down by daughter and brought to ED 1/31 where she was noted to have pinpoint pupils, L hemiparesis, and right gaze deviation. She was intubated for airway protection. 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 MRI brain revealed no stroke. 2/10 - one way extubation (ETT ten days). PMH: dementia with memory loss but  functional soical communication with family; acute CHF, HLD, HTN,  Prosthetic valve dysfunction (07/22/2014), S/P MVR (mitral valve replacement) (04/11/1998), S/P redo mitral valve replacement with bioprosthetic valve (07/25/2014), Stroke, Thrombosis of prosthetic heart valve (07/25/2014), and Valvular endocarditis (2000). Pt had an MBS on 2/11 that indicated a functional swallow with adequate pharyngeal squeezing. Diet rec was dys 1/thin liquid with no trials of solids due to impaired mentation. Type of Study: Bedside Swallow Evaluation Previous Swallow Assessment: yes (MBS on 2/11 (check evaluation for full note)) Diet Prior to this Study: Dysphagia 1 (pureed);Thin liquids (Level 0);Other (Comment) (Diet rec following recent MBS, per nursing reports the pt has had no arousal to trays and recieves all of her nutritional needs via cortrak) Temperature Spikes Noted: No Respiratory Status: Nasal cannula History of Recent Intubation: Yes Total duration of intubation (days): 10  days Date extubated: 02/17/23 Behavior/Cognition: Lethargic/Drowsy;Doesn't follow directions Oral Cavity Assessment: Other (comment) (Unable to assess due to lethargy) Oral Care Completed by SLP: Other (Comment) (Unable to complete due to lethargy) Oral Cavity - Dentition: Missing dentition Self-Feeding Abilities: Total assist Patient Positioning: Upright in bed Baseline Vocal Quality: Not observed Volitional Cough: Cognitively unable to elicit Volitional  Swallow: Unable to elicit    Oral/Motor/Sensory Function Overall Oral Motor/Sensory Function: Other (comment) (not f/c, symmetric at baseline)   Ice Chips Ice chips: Impaired Presentation: Spoon Oral Phase Impairments: Poor awareness of bolus;Reduced labial seal;Impaired mastication Oral Phase Functional Implications: Left anterior spillage;Right lateral sulci pocketing;Oral residue;Prolonged oral transit Pharyngeal Phase Impairments: Throat Clearing - Delayed;Cough - Delayed;Unable to trigger swallow (No discernable attempt to swallow the bolus)   Thin Liquid Thin Liquid: Not tested    Nectar Thick Nectar Thick Liquid: Not tested   Honey Thick Honey Thick Liquid: Not tested   Puree Puree: Not tested   Solid     Solid: Not tested     Dione Housekeeper M.S. CCC-SLP

## 2023-03-03 ENCOUNTER — Inpatient Hospital Stay (HOSPITAL_COMMUNITY): Payer: 59

## 2023-03-03 DIAGNOSIS — G9340 Encephalopathy, unspecified: Secondary | ICD-10-CM | POA: Diagnosis not present

## 2023-03-03 LAB — CBC
HCT: 26.2 % — ABNORMAL LOW (ref 36.0–46.0)
Hemoglobin: 7.9 g/dL — ABNORMAL LOW (ref 12.0–15.0)
MCH: 25.9 pg — ABNORMAL LOW (ref 26.0–34.0)
MCHC: 30.2 g/dL (ref 30.0–36.0)
MCV: 85.9 fL (ref 80.0–100.0)
Platelets: 256 10*3/uL (ref 150–400)
RBC: 3.05 MIL/uL — ABNORMAL LOW (ref 3.87–5.11)
RDW: 24.6 % — ABNORMAL HIGH (ref 11.5–15.5)
WBC: 11.9 10*3/uL — ABNORMAL HIGH (ref 4.0–10.5)
nRBC: 2.2 % — ABNORMAL HIGH (ref 0.0–0.2)

## 2023-03-03 LAB — COMPREHENSIVE METABOLIC PANEL
ALT: 18 U/L (ref 0–44)
AST: 30 U/L (ref 15–41)
Albumin: 2.1 g/dL — ABNORMAL LOW (ref 3.5–5.0)
Alkaline Phosphatase: 99 U/L (ref 38–126)
Anion gap: 8 (ref 5–15)
BUN: 54 mg/dL — ABNORMAL HIGH (ref 8–23)
CO2: 24 mmol/L (ref 22–32)
Calcium: 9.4 mg/dL (ref 8.9–10.3)
Chloride: 107 mmol/L (ref 98–111)
Creatinine, Ser: 1.59 mg/dL — ABNORMAL HIGH (ref 0.44–1.00)
GFR, Estimated: 34 mL/min — ABNORMAL LOW (ref >60)
Glucose, Bld: 186 mg/dL — ABNORMAL HIGH (ref 70–99)
Potassium: 5.8 mmol/L — ABNORMAL HIGH (ref 3.5–5.1)
Sodium: 139 mmol/L (ref 135–145)
Total Bilirubin: 0.9 mg/dL (ref 0.0–1.2)
Total Protein: 6.3 g/dL — ABNORMAL LOW (ref 6.5–8.1)

## 2023-03-03 LAB — TYPE AND SCREEN
ABO/RH(D): B POS
Antibody Screen: NEGATIVE
Unit division: 0

## 2023-03-03 LAB — CBC WITH DIFFERENTIAL/PLATELET
Abs Immature Granulocytes: 0 10*3/uL (ref 0.00–0.07)
Basophils Absolute: 0.2 10*3/uL — ABNORMAL HIGH (ref 0.0–0.1)
Basophils Relative: 1 %
Eosinophils Absolute: 0.3 10*3/uL (ref 0.0–0.5)
Eosinophils Relative: 2 %
HCT: 27.2 % — ABNORMAL LOW (ref 36.0–46.0)
Hemoglobin: 8.1 g/dL — ABNORMAL LOW (ref 12.0–15.0)
Lymphocytes Relative: 1 %
Lymphs Abs: 0.2 10*3/uL — ABNORMAL LOW (ref 0.7–4.0)
MCH: 25.8 pg — ABNORMAL LOW (ref 26.0–34.0)
MCHC: 29.8 g/dL — ABNORMAL LOW (ref 30.0–36.0)
MCV: 86.6 fL (ref 80.0–100.0)
Monocytes Absolute: 0.7 10*3/uL (ref 0.1–1.0)
Monocytes Relative: 4 %
Neutro Abs: 16 10*3/uL — ABNORMAL HIGH (ref 1.7–7.7)
Neutrophils Relative %: 92 %
Platelets: 255 10*3/uL (ref 150–400)
RBC: 3.14 MIL/uL — ABNORMAL LOW (ref 3.87–5.11)
RDW: 25.5 % — ABNORMAL HIGH (ref 11.5–15.5)
WBC: 17.4 10*3/uL — ABNORMAL HIGH (ref 4.0–10.5)
nRBC: 3.2 % — ABNORMAL HIGH (ref 0.0–0.2)
nRBC: 4 /100{WBCs} — ABNORMAL HIGH

## 2023-03-03 LAB — CULTURE, BLOOD (ROUTINE X 2)
Culture: NO GROWTH
Culture: NO GROWTH

## 2023-03-03 LAB — BASIC METABOLIC PANEL
Anion gap: 9 (ref 5–15)
BUN: 60 mg/dL — ABNORMAL HIGH (ref 8–23)
CO2: 22 mmol/L (ref 22–32)
Calcium: 9.2 mg/dL (ref 8.9–10.3)
Chloride: 109 mmol/L (ref 98–111)
Creatinine, Ser: 1.63 mg/dL — ABNORMAL HIGH (ref 0.44–1.00)
GFR, Estimated: 33 mL/min — ABNORMAL LOW (ref >60)
Glucose, Bld: 91 mg/dL (ref 70–99)
Potassium: 5.8 mmol/L — ABNORMAL HIGH (ref 3.5–5.1)
Sodium: 140 mmol/L (ref 135–145)

## 2023-03-03 LAB — GLUCOSE, CAPILLARY
Glucose-Capillary: 163 mg/dL — ABNORMAL HIGH (ref 70–99)
Glucose-Capillary: 150 mg/dL — ABNORMAL HIGH (ref 70–99)
Glucose-Capillary: 152 mg/dL — ABNORMAL HIGH (ref 70–99)
Glucose-Capillary: 162 mg/dL — ABNORMAL HIGH (ref 70–99)
Glucose-Capillary: 111 mg/dL — ABNORMAL HIGH (ref 70–99)
Glucose-Capillary: 78 mg/dL (ref 70–99)

## 2023-03-03 LAB — LACTIC ACID, PLASMA: Lactic Acid, Venous: 2.2 mmol/L (ref 0.5–1.9)

## 2023-03-03 LAB — BPAM RBC
Blood Product Expiration Date: 202503202359
ISSUE DATE / TIME: 202502231039
Unit Type and Rh: 7300

## 2023-03-03 LAB — PROTIME-INR
INR: 1.9 — ABNORMAL HIGH (ref 0.8–1.2)
Prothrombin Time: 21.8 s — ABNORMAL HIGH (ref 11.4–15.2)

## 2023-03-03 LAB — MAGNESIUM: Magnesium: 2 mg/dL (ref 1.7–2.4)

## 2023-03-03 MED ORDER — ACETAMINOPHEN 325 MG PO TABS: 650.0000 mg | ORAL_TABLET | Freq: Four times a day (QID) | ORAL | Status: DC | PRN

## 2023-03-03 MED ORDER — SODIUM ZIRCONIUM CYCLOSILICATE 10 G PO PACK
10.0000 g | PACK | Freq: Three times a day (TID) | ORAL | Status: AC
  Administered 2023-03-03 (×3): 10 g via ORAL
  Filled 2023-03-03 (×3): qty 1

## 2023-03-03 MED ORDER — SENNA 8.6 MG PO TABS: 1.0000 | ORAL_TABLET | Freq: Every evening | ORAL | Status: DC | PRN

## 2023-03-03 MED ORDER — LORAZEPAM 1 MG PO TABS: 1.0000 mg | ORAL_TABLET | ORAL | Status: DC | PRN

## 2023-03-03 MED ORDER — PANTOPRAZOLE SODIUM 40 MG IV SOLR
40.0000 mg | Freq: Two times a day (BID) | INTRAVENOUS | Status: DC
  Administered 2023-03-03: 40 mg via INTRAVENOUS
  Filled 2023-03-03: qty 10

## 2023-03-03 MED ORDER — MIDODRINE HCL 5 MG PO TABS
5.0000 mg | ORAL_TABLET | Freq: Three times a day (TID) | ORAL | Status: DC
Start: 2023-03-03 — End: 2023-03-03

## 2023-03-03 MED ORDER — MORPHINE SULFATE (CONCENTRATE) 10 MG /0.5 ML PO SOLN: 5.0000 mg | ORAL | Status: DC | PRN

## 2023-03-03 MED ORDER — ONDANSETRON 4 MG PO TBDP: 4.0000 mg | ORAL_TABLET | Freq: Four times a day (QID) | ORAL | Status: DC | PRN

## 2023-03-03 MED ORDER — LORAZEPAM 2 MG/ML IJ SOLN
1.0000 mg | INTRAMUSCULAR | Status: DC | PRN
  Administered 2023-03-04: 1 mg via INTRAVENOUS
  Filled 2023-03-03: qty 1

## 2023-03-03 MED ORDER — HALOPERIDOL 0.5 MG PO TABS: 0.5000 mg | ORAL_TABLET | ORAL | Status: DC | PRN

## 2023-03-03 MED ORDER — MIDODRINE HCL 5 MG PO TABS
5.0000 mg | ORAL_TABLET | Freq: Three times a day (TID) | ORAL | Status: DC
  Administered 2023-03-03: 5 mg
  Filled 2023-03-03: qty 1

## 2023-03-03 MED ORDER — SODIUM CHLORIDE 0.9 % IV BOLUS
1000.0000 mL | Freq: Once | INTRAVENOUS | Status: AC
  Administered 2023-03-03: 1000 mL via INTRAVENOUS

## 2023-03-03 MED ORDER — SODIUM CHLORIDE 0.9 % IV BOLUS: 500.0000 mL | Freq: Once | INTRAVENOUS | Status: DC

## 2023-03-03 MED ORDER — HALOPERIDOL LACTATE 2 MG/ML PO CONC: 0.5000 mg | ORAL | Status: DC | PRN

## 2023-03-03 MED ORDER — GLYCOPYRROLATE 1 MG PO TABS: 1.0000 mg | ORAL_TABLET | ORAL | Status: DC | PRN

## 2023-03-03 MED ORDER — POLYVINYL ALCOHOL 1.4 % OP SOLN: 1.0000 [drp] | Freq: Four times a day (QID) | OPHTHALMIC | Status: DC | PRN

## 2023-03-03 MED ORDER — CALCIUM GLUCONATE-NACL 2-0.675 GM/100ML-% IV SOLN
2.0000 g | Freq: Once | INTRAVENOUS | Status: AC
  Administered 2023-03-03: 2000 mg via INTRAVENOUS
  Filled 2023-03-03: qty 100

## 2023-03-03 MED ORDER — HALOPERIDOL LACTATE 5 MG/ML IJ SOLN: 0.5000 mg | INTRAMUSCULAR | Status: DC | PRN

## 2023-03-03 MED ORDER — SODIUM CHLORIDE 0.9 % IV SOLN: 12.5000 mg | Freq: Four times a day (QID) | INTRAVENOUS | Status: DC | PRN

## 2023-03-03 MED ORDER — SODIUM CHLORIDE 0.9 % IV BOLUS: 1000.0000 mL | Freq: Once | INTRAVENOUS | Status: DC

## 2023-03-03 MED ORDER — BIOTENE DRY MOUTH MT LIQD: 15.0000 mL | OROMUCOSAL | Status: DC | PRN

## 2023-03-03 MED ORDER — MORPHINE SULFATE (CONCENTRATE) 10 MG /0.5 ML PO SOLN
5.0000 mg | ORAL | Status: DC | PRN
  Administered 2023-03-04: 5 mg via SUBLINGUAL
  Filled 2023-03-03: qty 0.5

## 2023-03-03 MED ORDER — LORAZEPAM 2 MG/ML PO CONC: 1.0000 mg | ORAL | Status: DC | PRN

## 2023-03-03 MED ORDER — SODIUM CHLORIDE 0.9 % IV SOLN
2.0000 g | INTRAVENOUS | Status: DC
  Administered 2023-03-03: 2 g via INTRAVENOUS
  Filled 2023-03-03: qty 20

## 2023-03-03 MED ORDER — ONDANSETRON HCL 4 MG/2ML IJ SOLN: 4.0000 mg | Freq: Four times a day (QID) | INTRAMUSCULAR | Status: DC | PRN

## 2023-03-03 MED ORDER — GLYCOPYRROLATE 0.2 MG/ML IJ SOLN
0.2000 mg | INTRAMUSCULAR | Status: DC | PRN
  Administered 2023-03-04: 0.2 mg via INTRAVENOUS
  Filled 2023-03-03: qty 1

## 2023-03-03 MED ORDER — GLYCOPYRROLATE 0.2 MG/ML IJ SOLN
0.2000 mg | INTRAMUSCULAR | Status: DC | PRN
  Administered 2023-03-04: 0.2 mg via SUBCUTANEOUS
  Filled 2023-03-03: qty 1

## 2023-03-03 MED ORDER — ACETAMINOPHEN 650 MG RE SUPP: 650.0000 mg | Freq: Four times a day (QID) | RECTAL | Status: DC | PRN

## 2023-03-03 NOTE — Progress Notes (Signed)
 Called by nursing.  Informed patient's hypotensive sister blood pressure in the low 80s, HR 114, respiratory rate 0-10 with periods of apnea.  Patient unresponsive.  Hypoxic now placed on a nonrebreather.  500 cc IV fluid bolus ordered.  ABG not ordered as patient unresponsive not safe for BiPAP.  Patient's daughter [name] contacted, updated on patient's decline.  Daughter coming to patient's bedside.  Patient with hyperkalemia earlier in the day.  Patient received Lokelma approximately 9 AM today and yesterday.  Stat BMP, Mag ordered, stat EKG. FSBS. IV calcium ordered FSBS 111 EKG: Widened QRS, which is new

## 2023-03-03 NOTE — Progress Notes (Signed)
 Unresponsive, rate 5-10 with period of apnea. BP 80/60. Wide complex rhythm . Contacted oncall to eval

## 2023-03-03 NOTE — Progress Notes (Incomplete)
 Nutrition Follow-up  DOCUMENTATION CODES:   Non-severe (moderate) malnutrition in context of chronic illness  INTERVENTION:  Continue TF via cortrak tube: Osmolite 1.2 at goal rate of 39ml/h Add free water flush Provides: 1584 kcal, 73g protein and free water daily   NUTRITION DIAGNOSIS:   Moderate Malnutrition related to chronic illness (dementia) as evidenced by mild fat depletion, severe muscle depletion. - remains applicable  GOAL:   Patient will meet greater than or equal to 90% of their needs  ***  MONITOR:   PO intake, Weight trends, Labs, Supplement acceptance  REASON FOR ASSESSMENT:   Consult Enteral/tube feeding initiation and management (Trickle start)  ASSESSMENT:   78 yo female admitted after being found unresponsive by daughter, bradycardic and hypotensive but did not lose pulse, intubated for airway protection, possible seizure.  PMH includes HTN, HLD, stroke, mitral stenosis requiring MVR, CKD 3, vascular dementia  1/31 - Admitted, intubated, CT head with R SDH 2/2 - TF initiated 2/10 - One way extubation, TF discontinued 2/11 - MB, advanced to Dysphagia 1, Thins 2/14 - cortrak placed  2/23 - diet downgraded to NPO  PMT following. Family not interested in PEG tube per MD note yesterday.  Allowing time for outcomes.   Medications: lokelma, IV abx  Labs:  Potassium 5.8 BUN 54 Cr 1.59 GFR 34 CBG's 143-163 x24 hours  Diet Order:   Diet Order             Diet NPO time specified  Diet effective now                   EDUCATION NEEDS:   Not appropriate for education at this time  Skin:  Skin Assessment: Skin Integrity Issues: Skin Integrity Issues:: Unstageable, Other (Comment) Unstageable: lip Other: skin tear, MASD to perineum  Last BM:  2/18 - type 4  Height:   Ht Readings from Last 1 Encounters:  02/09/23 5\' 4"  (1.626 m)    Weight:   Wt Readings from Last 1 Encounters:  03/03/23 70.4 kg    Ideal Body  Weight:  54.5 kg  BMI:  Body mass index is 26.64 kg/m.  Estimated Nutritional Needs:   Kcal:  1450-1650 kcals  Protein:  70-85 g  Fluid:  >/= 1.5 L    ***

## 2023-03-03 NOTE — Progress Notes (Signed)
 Lactic acid 2.2 called into on call provider

## 2023-03-03 NOTE — Progress Notes (Signed)
 [facility]  PROGRESS NOTE  Randall Rampersad ZOX:[id number] DOB: [birth year] DOA: 02/07/2023 PCP: Myrlene Broker, MD   Brief HPI:   Patient is a 78 year old female, with past medical history significant for hypertension, mitral valve replacement, Afibb on warfarin, and prior hx of endocarditis.  Patient was found unresponsive by her daughter.  Pt was intubated in ED upon arrival. Initial workup revealed small mixed density R SDH, basilar and L vert occlusion. 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 Patient supratherapeutic on Warfarin for afib, MVR. Was given Kcentra in ED.   Patient was given Ativan and loaded with Keppra. Neurosurgery felt that the small size of the subdural hematoma did not warrant surgical intervention.  Patient's encephalopathy was felt to be multifactorial, with seizure/postictal state, RSV infection, subdural hematoma and underlying vascular dementia. Subsequent to extubation, patient has required some suctioning.  Patient has remained somewhat lethargic but was apparently able to phonate/vocalize. She had dysphagia, which prompted placement of Cortrak.   02/07/2023-MRI-no evidence of acute infarct, right subdural hemorrhage.  No mass effect, no midline shift.  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  Required intubation for airway protection.  Seen by neurosurgery felt not a candidate for surgery with small acute on chronic subdurals recommended against reversal of warfarin given concern about basilar artery occlusion.  Seen by neurology, EEG ordered, started on Keppra.  Hypotensive, started on norepinephrine 2/1 EEG suggesting moderate to severe diffuse encephalopathy but no seizure or epileptiform discharges.  Repeat CT head ordered, and showing no new changes 2/2 eeg w areas of increased epileptogenicity + slowing. Off cont sedation.  ECHO  w MV gradient change and decr EF 2/3  + RSV; EEG in place which is showing high risk of epileptogenicity but no active seizures; neuro added valproic acid 2/5 plan to cont through the weekend and re-eval GOC . INR 3.6 not on AC 2/6 recheck INR  2/8 more awake. Met with family. DNR. Discussing possible 1-way extubation 2/10 One-way extubation completed. Tolerated well. 2/11: was signed off on dysphagia diet  2/12: mental status waxes and wanes. Family convo about cortrak possibly for ongoing med needs, supplemental TF 2/13: family agrees to cortrak  .... 2/19 PCCM reconsulted for hypotension. 2/20 MAP > 65 without vasopressors.  Has been getting albumin and maintenance IV fluids.  Modest improving in neurologic function.  At least phonating and says "ow" to pain. 2/21: MAP improved, on IV fluids  03/03/2023: Patient seen.  Patient is volume overloaded.  Patient's blood pressure is on the low side.  Goal of care needs to be addressed.     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 She was also found to be RSV +ve on presentation.   Hypotension/fever/SIRS: -Resolved -Chest x-ray obtained today showed persistent patchy densities in the mid and lower lungs, findings consistent with possible infection. -Continue ceftazidime -Blood cultures x 2 is negative to date 03/03/2023: The hypotension persists.  Start midodrine.  Low threshold  to use IV albumin.  Acute  encephalopathy: -No significant improvement -With baseline vascular dementia, new onset seizures, SDH -Neurosurgery consultation obtained, no intervention recommended as patient has chronic right SDH -Stat head CT for aphasia showed no new  intracranial process. -Continue Keppra, valproate -Continue  Aricept 02/09/2023: Patient noted back to baseline.  Chronic DVT of left internal jugular and superficial thrombophlebitis: -Started on apixaban 5 mg p.o. twice daily -Apixaban was discontinued as patient developed anemia with hemoglobin 6.5 today  History of MVR, redo bioprosthetic AVR with paroxysmal atrial fibrillation: -Was started on apixaban, currently on hold due to developing anemia  Acute hypoxemic respiratory failure: -RSV infection -Chest x-ray obtained today shows patchy densities in both lung fields -Continue antibiotics, patient is on ceftazidime -Follow blood culture results 03/03/2023: Stable.  Low threshold to consider low-dose torsemide and albumin.  Acute kidney injury on CKD stage IIIb -Creatinine is 1.60 today -Avoid nephrotoxins -Follow BMP in a.m. 03/03/2023: AKI is likely prerenal.  Normocytic anemia/possible acute blood loss anemia: -Likely in setting of chronic illness -?  Acute blood loss anemia -Hemoglobin was 7.1 on 2/21; dropped to 6.5 today -1 unit PRBC ordered -Check stool for occult blood -Follow CBC in a.m., transfuse for Hb less than 7  Hyperkalemia -Potassium is 5.8 -Will give 1 dose of Lokelma 10 g x 1 -Follow serum potassium in a.m. 03/03/2023: Persistent hyperkalemia.  Suspect hyperkalemia is related to occult GI bleed.  Lokelma 10 g p.o. 3 times daily x 3 doses.  Stool for fecal occult blood.  Repeat PT/INR.   Goals of care -Palliative care consulted, patient is currently DNR -Discussed in detail with patient's son at bedside.  Explained that currently patient is on core track feeding tube which is just a temporary measure, if she does not  start eating in next few days, putting a PEG tube will not be in her best interest.  Family is not interested in putting PEG tube.  Palliative care following.   Prognosis is guarded.  Goal of care needs to be defined.  Medications     sodium chloride   Intravenous Once   allopurinol  100 mg Per Tube Daily   arformoterol  15 mcg Nebulization BID   atorvastatin  40 mg Per Tube Daily   budesonide (PULMICORT) nebulizer solution  0.5 mg Nebulization BID   Chlorhexidine Gluconate Cloth  6 each Topical Daily   donepezil  10 mg Per Tube QHS   levETIRAcetam  500 mg Per Tube BID   mouth rinse  15 mL Mouth Rinse 4 times per day   pantoprazole (PROTONIX) IV  40 mg Intravenous Q24H   revefenacin  175 mcg Nebulization Daily   sodium zirconium cyclosilicate  10 g Oral TID   valproic acid  325 mg Per Tube Q8H     Data Reviewed:   CBG:  Recent Labs  Lab 03/02/23 0320 03/02/23 1938 03/02/23 2320 03/03/23 0330 03/03/23 0840  GLUCAP 143* 152* 155* 163* 150*    SpO2: 94 % O2 Flow Rate (L/min): 2 L/min FiO2 (%): 28 %    Vitals:   03/03/23 0400 03/03/23 0500 03/03/23 0700 03/03/23 0841  BP: 98/74  104/75   Pulse: 96  (!) 105   Resp: (!) 26  (!) 28   Temp:    98.9 F (37.2 C)  TempSrc:    Axillary  SpO2: 96%  94%   Weight:  70.4 kg    Height:  Data Reviewed:  Basic Metabolic Panel: Recent Labs  Lab 02/25/23 0610 02/26/23 0546 02/26/23 0547 02/27/23 0210 02/28/23 0427 03/02/23 0232 03/03/23 0218  NA 138  --  136 138 138 139 139  K 4.9  --  5.0 5.3* 5.0 5.8* 5.8*  CL 102  --  101 105 104 105 107  CO2 31  --  27 25 23 25 24   GLUCOSE 158*  --  168* 136* 141* 160* 186*  BUN 24*  --  27* 28* 31* 44* 54*  CREATININE 1.25*  --  1.71* 1.41* 1.36* 1.60* 1.59*  CALCIUM 8.7*  --  8.8* 9.4 9.7 9.4 9.4  MG 1.7 1.8  --  1.6* 2.0  --   --   PHOS 1.1*  --  3.2  --  3.0  --   --     CBC: Recent Labs  Lab 02/26/23 1752 02/27/23 0210 02/28/23 0427 03/02/23 0232  03/03/23 0218  WBC 14.2* 12.9* 12.2* 8.5 11.9*  HGB 7.4* 7.2* 7.1* 6.5* 7.9*  HCT 25.3* 24.0* 23.6* 21.7* 26.2*  MCV 83.0 82.5 83.7 84.4 85.9  PLT PLATELET CLUMPS NOTED ON SMEAR, UNABLE TO ESTIMATE 179 207 231 256    LFT Recent Labs  Lab 02/25/23 0610 02/26/23 0547 02/27/23 0210 03/02/23 0232 03/03/23 0218  AST  --   --  29 24 30   ALT  --   --  19 17 18   ALKPHOS  --   --  87 84 99  BILITOT  --   --  1.6* 1.1 0.9  PROT  --   --  6.5 6.2* 6.3*  ALBUMIN 1.8* 1.9* 2.7* 2.2* 2.1*     Antibiotics: Anti-infectives (From admission, onward)    Start     Dose/Rate Route Frequency Ordered Stop   03/03/23 0500  cefTAZidime (FORTAZ) 2 g in sodium chloride 0.9 % 100 mL IVPB        2 g 200 mL/hr over 30 Minutes Intravenous Every 24 hours 03/02/23 1558     02/28/23 1700  cefTAZidime (FORTAZ) 2 g in sodium chloride 0.9 % 100 mL IVPB  Status:  Discontinued        2 g 200 mL/hr over 30 Minutes Intravenous Every 12 hours 02/28/23 1126 03/02/23 1558   02/26/23 1700  meropenem (MERREM) 1 g in sodium chloride 0.9 % 100 mL IVPB  Status:  Discontinued        1 g 200 mL/hr over 30 Minutes Intravenous Every 12 hours 02/26/23 1620 02/28/23 1126   02/26/23 1545  meropenem (MERREM) 500 mg in sodium chloride 0.9 % 100 mL IVPB  Status:  Discontinued        500 mg 200 mL/hr over 30 Minutes Intravenous Every 8 hours 02/26/23 1452 02/26/23 1619   02/08/23 1600  cefTRIAXone (ROCEPHIN) 2 g in sodium chloride 0.9 % 100 mL IVPB  Status:  Discontinued        2 g 200 mL/hr over 30 Minutes Intravenous Every 24 hours 02/08/23 1453 02/12/23 1009   02/07/23 2100  ceFEPIme (MAXIPIME) 2 g in sodium chloride 0.9 % 100 mL IVPB        2 g 200 mL/hr over 30 Minutes Intravenous  Once 02/07/23 2050 02/07/23 2208   02/07/23 2100  metroNIDAZOLE (FLAGYL) IVPB 500 mg        500 mg 100 mL/hr over 60 Minutes Intravenous  Once 02/07/23 2050 02/08/23 0038   02/07/23 2100  vancomycin (VANCOCIN) IVPB 1000 mg/200 mL premix  1,000 mg 200 mL/hr over 60 Minutes Intravenous  Once 02/07/23 2050 02/08/23 0217        DVT prophylaxis: Apixaban  Code Status: DNR  Family Communication: No family at bedside   CONSULTS CCM, palliative care, neurosurgery   Subjective   Continues to be nonverbal.  Hemoglobin dropped to 6.5 this morning   Objective    Physical Examination: General condition: Patient is volume overloaded. HEENT: Patient is pale. Neck: Supple. Lungs: Decreased air entry. CVS: S1-S2. Abdomen: Soft and nontender. Neuro: Awake and alert. Extremities: 1+ to 2 bilateral lower extremity edema.    Status is: Inpatient:      Pressure Injury 02/26/23 Sacrum Bilateral;Lower Stage 2 -  Partial thickness loss of dermis presenting as a shallow open injury with a red, pink wound bed without slough. Red, Pink (Active)  02/26/23 1830  Location: Sacrum  Location Orientation: Bilateral;Lower  Staging: Stage 2 -  Partial thickness loss of dermis presenting as a shallow open injury with a red, pink wound bed without slough.  Wound Description (Comments): Red, Pink  Present on Admission: Yes   Time spent: 55 minutes.  Barnetta Chapel   [facility] If 7PM-7AM, please contact night-coverage at [email or web address], Office  [phone]   03/03/2023, 8:42 AM  LOS: 24 days

## 2023-03-03 NOTE — Plan of Care (Signed)

## 2023-03-03 NOTE — Progress Notes (Signed)
 Speech Language Pathology Treatment: Dysphagia  Patient Details Name: [name] MRN: [id number] DOB: 01-09-1945 Today's Date: 03/03/2023 Time: 4098-1191 SLP Time Calculation (min) (ACUTE ONLY): 16 min  Assessment / Plan / Recommendation Clinical Impression  Pt was seen for swallowing therapy after yesterday's repeat bedside swallowing evaluation and recommendations for NPO.  [name] was alert, smiling.  She allowed oral care to remove standing secretions in mouth.  She accepted ice chips, sips of water with swallowing behaviors that are consistent with performance since SLP started working with her on 2/11.  She demonstrated a palpable swallow response.  Attempts to feed her pudding were met with oral holding/ no effort to manipulate material. It was suctioned from her mouth and efforts were ceased.   She continues to demonstrate fluctuations in her ability to recognize and actively manipulate POs. Even in her most alert moments, based on review of SLP notes since 2/11, she eats, at best,  a few bites of puree and takes a few sips of liquid before she stops.    I called her daughter, [name], and spoke with her about her Mom's overall swallowing abilities and the poor progress with her ability to meet her needs by POs alone. She verbalized understanding.    SLP will follow for GOC; recommend allowing ice chips/sips of water.     HPI HPI: Pt is a 78 yo female who was found down by daughter and brought to ED 1/31 where she was noted to have pinpoint pupils, L hemiparesis, and right gaze deviation. She was intubated for airway protection. 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 MRI brain revealed no stroke. 2/10 - one way extubation (ETT ten days). PMH: dementia with memory loss but  functional soical communication with family; acute CHF, HLD, HTN,  Prosthetic valve dysfunction (07/22/2014), S/P MVR  (mitral valve replacement) (04/11/1998), S/P redo mitral valve replacement with bioprosthetic valve (07/25/2014), Stroke, Thrombosis of prosthetic heart valve (07/25/2014), and Valvular endocarditis (2000). Pt had an MBS on 2/11 that indicated a functional swallow with adequate pharyngeal squeezing. Diet rec was dys 1/thin liquid with no trials of solids due to impaired mentation.  02/26/2023 the pt was hypotensive and had a fever.  PCCM/ICU was consulted and the pt was taken back to the ICU. Bedside swallow eval was repeated on 2/23.      SLP Plan  Continue with current plan of care      Recommendations for follow up therapy are one component of a multi-disciplinary discharge planning process, led by the attending physician.  Recommendations may be updated based on patient status, additional functional criteria and insurance authorization.    Recommendations  Diet recommendations:  (water and ice chips) Liquids provided via: Cup;Teaspoon;Straw Medication Administration: Via alternative means                  Oral care prior to ice chip/H20;Oral care QID   Frequent or constant Supervision/Assistance Dysphagia, oropharyngeal phase (R13.12)     Continue with current plan of care    [name] L. Margaret [name], MA CCC/SLP Clinical Specialist - Acute Care SLP Acute Rehabilitation Services Office number [phone]  Blenda Rodriguez Margaret  03/03/2023, 3:46 PM

## 2023-03-04 DIAGNOSIS — R0609 Other forms of dyspnea: Secondary | ICD-10-CM | POA: Diagnosis not present

## 2023-03-04 DIAGNOSIS — Z515 Encounter for palliative care: Secondary | ICD-10-CM | POA: Diagnosis not present

## 2023-03-04 LAB — LACTIC ACID, PLASMA: Lactic Acid, Venous: 2.3 mmol/L (ref 0.5–1.9)

## 2023-03-04 MED ORDER — MORPHINE SULFATE (PF) 2 MG/ML IV SOLN
1.0000 mg | INTRAVENOUS | Status: DC | PRN
  Administered 2023-03-04: 1 mg via INTRAVENOUS
  Filled 2023-03-04: qty 1

## 2023-03-08 NOTE — Discharge Summary (Addendum)
 Physician Discharge Summary  Patient ID: [name] MRN: [id number] DOB/AGE: 76-May-1947 76 y.o.  Death discharge summary:  Admit date: March 05, 2023 Patient died on:  Mar 30, 2023 at 1247 hrs.  Patient was pronounced dead by Elita Quick, RN.  Discharge Diagnoses:  Principal Problem:   Encephalopathy acute Active Problems:   Subdural hematoma (HCC)   Hypotension   Acute hypoxic respiratory failure (HCC)   RSV (respiratory syncytial virus pneumonia)   Altered mental status   Warfarin toxicity, accidental or unintentional, initial encounter   SIRS   Vascular dementia (HCC)   DNR (do not resuscitate)   Goals of care, counseling/discussion   On mechanically assisted ventilation (HCC)   RSV (acute bronchiolitis due to respiratory syncytial virus)   Acute encephalopathy   AKI (acute kidney injury) on chronic kidney disease stage IIIb   Dysphagia   Malnutrition of moderate degree   Chronic DVT of left internal jugular and superficial thrombophlebitis    History of MVR, redo bioprosthetic AVR with paroxysmal atrial fibrillation:    Normocytic anemia    Hyperkalemia   Likely acute blood loss anemia -Possible acute subdural hematoma, possible secondary to fall.   Patient was a 78 year old female, with past medical history significant for hypertension, mitral valve replacement, Afibb on warfarin, and prior hx of endocarditis.  Patient was found unresponsive by her daughter.   Pt was intubated in ED upon arrival. Initial workup revealed small mixed density right-sided subdural hemorrhage, basilar and L vert occlusion. 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 Patient supratherapeutic on Warfarin for afib, MVR.  Patient was given Kcentra in ED.  Patient was loaded with Keppra. Neurosurgery felt that the small size of the subdural hematoma did not warrant surgical intervention.  Patient's  encephalopathy was felt to be multifactorial, with seizure/postictal state, RSV infection, subdural hematoma and underlying vascular dementia. Subsequent to extubation, patient had required some suctioning.  Patient remained somewhat lethargic, had dysphagia, which prompted placement of Cortrak.  Patient continued to deteriorate.  Patient became hypotensive and was transferred back to ICU team.  Patient was eventually made comfort cares only.  Patient died on 03/30/2023.   Hospital events: Mar 05, 2023-MRI-no evidence of acute infarct, right subdural hemorrhage.  No mass effect, no midline shift.  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  Required intubation for airway protection.  Seen by neurosurgery felt not a candidate for surgery with small acute on chronic subdurals recommended against reversal of warfarin given concern about basilar artery occlusion.  Seen by neurology, EEG ordered, started on Keppra.  Hypotensive, started on norepinephrine 2/1 EEG suggesting moderate to severe diffuse encephalopathy but no seizure or epileptiform discharges.  Repeat CT head ordered, and showing no new changes 2/2 eeg w areas of increased epileptogenicity + slowing. Off cont sedation. ECHO  w MV gradient change and decr EF 2/3  + RSV; EEG in place which is showing high risk of epileptogenicity but no active seizures; neuro added valproic acid 2/5 plan to cont through the weekend and re-eval GOC . INR 3.6 not on AC 2/6 recheck INR  2/8 more awake. Met with family. DNR. Discussing possible 1-way extubation 2/10 One-way extubation completed. Tolerated well. 2/11: was signed off on dysphagia diet  2/12: mental status waxes and wanes. Family convo about cortrak possibly for ongoing med needs, supplemental TF 2/13: family agrees to cortrak  .... 2/19 PCCM reconsulted for hypotension. 2/20 MAP >  65 without vasopressors.  Has been getting albumin and maintenance IV fluids.  Modest improving in  neurologic function.  At least phonating and says "ow" to pain. 2/21: MAP improved, on IV fluids   03/03/2023: Patient seen.  Patient was volume overloaded.  Patient's blood pressure was on the low side.  Goal of care continued.     SignedBarnetta Chapel 03-06-23, 7:25 PM

## 2023-03-08 NOTE — Progress Notes (Signed)
 Patient ID: [name], female   DOB: May 27, 1945, 78 y.o.   MRN: [id number]    Progress Note from the Palliative Medicine Team at [facility]   Patient Name: [name]        Date: 03/07/2023 DOB: 09/22/1945  Age: 78 y.o. MRN#: [id number] Attending Physician: Barnetta Chapel, MD Primary Care Physician: Myrlene Broker, MD Admit Date: 02/07/2023   Reason for Consultation/Follow-up   Establishing Goals of Care   HPI/ Brief Hospital Review  78 y.o. female  with past medical history of mitral valve replacement, a-fib on warfarin, endocarditis, hypertension, and vascular dementia  admitted on 02/07/2023 after being found unresponsive by her daughter.    On arrival to the ED, she was noted to have pinpoint pupils, left hemiparesis and right gaze deviation. She was intubated for airway protection. 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 MRI brain revealed no stroke. Patient was given Ativan and loaded with Keppra. Neurosurgery felt that the small size of the subdural hematoma did not warrant surgical intervention. 1/31 intubated for airway protection. 2/3 +RSV. PCCM with ongoing GOC conversations and plans for one way extubation 2/10.    I was requested to come to the bedside to talk with family. Family made decision to shift focus of care  to  comfort last night.    Ongoing questions .     Subjective 78 y.o. female  with past medical history of mitral valve replacement, a-fib on warfarin, endocarditis, hypertension, and vascular dementia  admitted on 02/07/2023 after being found unresponsive by her daughter.    On arrival to the ED, she was noted to have pinpoint pupils, left hemiparesis and right gaze deviation. She was intubated for airway protection. CT head revealed mixed density right subdural hematoma, and chronic basilar and left vertebral artery  occlusions were seen on CT angiogram. MRI brain revealed no stroke. Patient was given Ativan and loaded with Keppra. Neurosurgery felt that the small size of the subdural hematoma did not warrant surgical intervention. 1/31 intubated for airway protection. 2/3 +RSV. PCCM with ongoing GOC conversations and plans for one way extubation 2/10.    Admitted to ICU 02/26/23.   Extensive chart review has been completed prior to meeting with patient/family  including labs, vital signs, imaging, progress/consult notes, orders, medications and available advance directive documents.   This NP assessed patient at the bedside for palliative medicine needs and emotional support.   Family at bedside, daughter, son and brother.  Education offered on  natural trajectory and expectations at EOL.    Shared that patient  appears to be transitioning at EOL and anything can happen at any time.  Prognosis is likely hours   Emotional support offered.   Questions and concerns addressed   Discussed with primary team and nursing staff   Time: 35   minutes  Detailed review of medical records ( labs, imaging, vital signs), medically appropriate exam ( MS, skin, cardiac,  resp)   discussed with treatment team, counseling and education to patient, family, staff, documenting clinical information, medication management, coordination of care    Lorinda Creed NP  Palliative Medicine Team Team Phone # [phone] Pager [phone]

## 2023-03-08 NOTE — Progress Notes (Incomplete)
[facility]  PROGRESS NOTE  [name] MVH:[id number] DOB: 04/22/1945 DOA: 02/07/2023 PCP: Myrlene Broker, MD   Brief HPI:   Patient is a 78 year old female, with past medical history significant for hypertension, mitral valve replacement, Afibb on warfarin, and prior hx of endocarditis.  Patient was found unresponsive by her daughter.  Pt was intubated in ED upon arrival. Initial workup revealed small mixed density R SDH, basilar and L vert occlusion. 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 Patient supratherapeutic on Warfarin for afib, MVR. Was given Kcentra in ED.   Patient was given Ativan and loaded with Keppra. Neurosurgery felt that the small size of the subdural hematoma did not warrant surgical intervention.  Patient's encephalopathy was felt to be multifactorial, with seizure/postictal state, RSV infection, subdural hematoma and underlying vascular dementia. Subsequent to extubation, patient has required some suctioning.  Patient has remained somewhat lethargic but was apparently able to phonate/vocalize. She had dysphagia, which prompted placement of Cortrak.   02/07/2023-MRI-no evidence of acute infarct, right subdural hemorrhage.  No mass effect, no midline shift.  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  Required intubation for airway protection.  Seen by neurosurgery felt not a candidate for surgery with small acute on chronic subdurals recommended against reversal of warfarin given concern about basilar artery occlusion.  Seen by neurology, EEG ordered, started on Keppra.  Hypotensive, started on norepinephrine 2/1 EEG suggesting moderate to severe diffuse encephalopathy but no seizure or epileptiform discharges.  Repeat CT head ordered, and showing no new changes 2/2 eeg w areas of increased epileptogenicity + slowing. Off cont sedation.  ECHO  w MV gradient change and decr EF 2/3  + RSV; EEG in place which is showing high risk of epileptogenicity but no active seizures; neuro added valproic acid 2/5 plan to cont through the weekend and re-eval GOC . INR 3.6 not on AC 2/6 recheck INR  2/8 more awake. Met with family. DNR. Discussing possible 1-way extubation 2/10 One-way extubation completed. Tolerated well. 2/11: was signed off on dysphagia diet  2/12: mental status waxes and wanes. Family convo about cortrak possibly for ongoing med needs, supplemental TF 2/13: family agrees to cortrak  .... 2/19 PCCM reconsulted for hypotension. 2/20 MAP > 65 without vasopressors.  Has been getting albumin and maintenance IV fluids.  Modest improving in neurologic function.  At least phonating and says "ow" to pain. 2/21: MAP improved, on IV fluids  03/03/2023: Patient seen.  Patient is volume overloaded.  Patient's blood pressure is on the low side.  Goal of care needs to be addressed.     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 She was also found to be RSV +ve on presentation.   Hypotension/fever/SIRS: -Resolved -Chest x-ray obtained today showed persistent patchy densities in the mid and lower lungs, findings consistent with possible infection. -Continue ceftazidime -Blood cultures x 2 is negative to date 03/03/2023: The hypotension persists.  Start midodrine.  Low threshold  to use IV albumin.  Acute  encephalopathy: -No significant improvement -With baseline vascular dementia, new onset seizures, SDH -Neurosurgery consultation obtained, no intervention recommended as patient has chronic right SDH -Stat head CT for aphasia showed no new  intracranial process. -Continue Keppra, valproate -Continue  Aricept 02/12/2023: Patient noted back to baseline.  Chronic DVT of left internal jugular and superficial thrombophlebitis: -Started on apixaban 5 mg p.o. twice daily -Apixaban was discontinued as patient developed anemia with hemoglobin 6.5 today  History of MVR, redo bioprosthetic AVR with paroxysmal atrial fibrillation: -Was started on apixaban, currently on hold due to developing anemia  Acute hypoxemic respiratory failure: -RSV infection -Chest x-ray obtained today shows patchy densities in both lung fields -Continue antibiotics, patient is on ceftazidime -Follow blood culture results 03/03/2023: Stable.  Low threshold to consider low-dose torsemide and albumin.  Acute kidney injury on CKD stage IIIb -Creatinine is 1.60 today -Avoid nephrotoxins -Follow BMP in a.m. 03/03/2023: AKI is likely prerenal.  Normocytic anemia/possible acute blood loss anemia: -Likely in setting of chronic illness -?  Acute blood loss anemia -Hemoglobin was 7.1 on 2/21; dropped to 6.5 today -1 unit PRBC ordered -Check stool for occult blood -Follow CBC in a.m., transfuse for Hb less than 7  Hyperkalemia -Potassium is 5.8 -Will give 1 dose of Lokelma 10 g x 1 -Follow serum potassium in a.m. 03/03/2023: Persistent hyperkalemia.  Suspect hyperkalemia is related to occult GI bleed.  Lokelma 10 g p.o. 3 times daily x 3 doses.  Stool for fecal occult blood.  Repeat PT/INR.   Goals of care -Palliative care consulted, patient is currently DNR -Discussed in detail with patient's son at bedside.  Explained that currently patient is on core track feeding tube which is just a temporary measure, if she does not  start eating in next few days, putting a PEG tube will not be in her best interest.  Family is not interested in putting PEG tube.  Palliative care following.   Prognosis is guarded.  Goal of care needs to be defined.  Medications     sodium chloride   Intravenous Once   Chlorhexidine Gluconate Cloth  6 each Topical Daily   mouth rinse  15 mL Mouth Rinse 4 times per day     Data Reviewed:   CBG:  Recent Labs  Lab 03/03/23 0840 03/03/23 1217 03/03/23 1606 03/03/23 1907 03/03/23 2327  GLUCAP 150* 152* 162* 111* 78    SpO2: (!) 86 % O2 Flow Rate (L/min): 15 L/min FiO2 (%): 28 %    Vitals:   03/03/23 2327 03/07/2023 0700 02/09/2023 0701 03/07/2023 0800  BP:  (!) 44/32 (!) 44/32 (!) 46/35  Pulse:  87 88 81  Resp:  (!) 0 (!) 0 (!) 0  Temp: 99.1 F (37.3 C)     TempSrc: Axillary     SpO2:  (!) 88% (!) 88% (!) 86%  Weight:      Height:          Data Reviewed:  Basic Metabolic Panel: Recent Labs  Lab 02/26/23 0546 02/26/23 0547 02/26/23 0547 02/27/23 0210 02/28/23 0427 03/02/23 0232 03/03/23 0218 03/03/23 2158  NA  --  136   < > 138 138 139 139 140  K  --  5.0   < > 5.3* 5.0 5.8* 5.8* 5.8*  CL  --  101   < > 105 104 105 107 109  CO2  --  27   < > 25 23 25 24  22  GLUCOSE  --  168*   < > 136* 141* 160* 186* 91  BUN  --  27*   < > 28* 31* 44* 54* 60*  CREATININE  --  1.71*   < > 1.41* 1.36* 1.60* 1.59* 1.63*  CALCIUM  --  8.8*   < > 9.4 9.7 9.4 9.4 9.2  MG 1.8  --   --  1.6* 2.0  --   --  2.0  PHOS  --  3.2  --   --  3.0  --   --   --    < > = values in this interval not displayed.    CBC: Recent Labs  Lab 02/27/23 0210 02/28/23 0427 03/02/23 0232 03/03/23 0218 03/03/23 2105  WBC 12.9* 12.2* 8.5 11.9* 17.4*  NEUTROABS  --   --   --   --  16.0*  HGB 7.2* 7.1* 6.5* 7.9* 8.1*  HCT 24.0* 23.6* 21.7* 26.2* 27.2*  MCV 82.5 83.7 84.4 85.9 86.6  PLT 179 207 231 256 255    LFT Recent Labs  Lab 02/26/23 0547 02/27/23 0210 03/02/23 0232 03/03/23 0218   AST  --  29 24 30   ALT  --  19 17 18   ALKPHOS  --  87 84 99  BILITOT  --  1.6* 1.1 0.9  PROT  --  6.5 6.2* 6.3*  ALBUMIN 1.9* 2.7* 2.2* 2.1*     Antibiotics: Anti-infectives (From admission, onward)    Start     Dose/Rate Route Frequency Ordered Stop   03/03/23 1200  cefTRIAXone (ROCEPHIN) 2 g in sodium chloride 0.9 % 100 mL IVPB  Status:  Discontinued        2 g 200 mL/hr over 30 Minutes Intravenous Every 24 hours 03/03/23 0849 02/28/2023 0917   03/03/23 0500  cefTAZidime (FORTAZ) 2 g in sodium chloride 0.9 % 100 mL IVPB  Status:  Discontinued        2 g 200 mL/hr over 30 Minutes Intravenous Every 24 hours 03/02/23 1558 03/03/23 0849   02/28/23 1700  cefTAZidime (FORTAZ) 2 g in sodium chloride 0.9 % 100 mL IVPB  Status:  Discontinued        2 g 200 mL/hr over 30 Minutes Intravenous Every 12 hours 02/28/23 1126 03/02/23 1558   02/26/23 1700  meropenem (MERREM) 1 g in sodium chloride 0.9 % 100 mL IVPB  Status:  Discontinued        1 g 200 mL/hr over 30 Minutes Intravenous Every 12 hours 02/26/23 1620 02/28/23 1126   02/26/23 1545  meropenem (MERREM) 500 mg in sodium chloride 0.9 % 100 mL IVPB  Status:  Discontinued        500 mg 200 mL/hr over 30 Minutes Intravenous Every 8 hours 02/26/23 1452 02/26/23 1619   02/08/23 1600  cefTRIAXone (ROCEPHIN) 2 g in sodium chloride 0.9 % 100 mL IVPB  Status:  Discontinued        2 g 200 mL/hr over 30 Minutes Intravenous Every 24 hours 02/08/23 1453 02/12/23 1009   02/07/23 2100  ceFEPIme (MAXIPIME) 2 g in sodium chloride 0.9 % 100 mL IVPB        2 g 200 mL/hr over 30 Minutes Intravenous  Once 02/07/23 2050 02/07/23 2208   02/07/23 2100  metroNIDAZOLE (FLAGYL) IVPB 500 mg        500 mg 100 mL/hr over 60 Minutes Intravenous  Once 02/07/23 2050 02/08/23 0038   02/07/23 2100  vancomycin (VANCOCIN) IVPB 1000 mg/200  mL premix        1,000 mg 200 mL/hr over 60 Minutes Intravenous  Once 02/07/23 2050 02/08/23 0217        DVT prophylaxis:  Apixaban  Code Status: DNR  Family Communication: No family at bedside   CONSULTS CCM, palliative care, neurosurgery   Subjective   Continues to be nonverbal.  Hemoglobin dropped to 6.5 this morning   Objective    Physical Examination: General condition: Patient is volume overloaded. HEENT: Patient is pale. Neck: Supple. Lungs: Decreased air entry. CVS: S1-S2. Abdomen: Soft and nontender. Neuro: Awake and alert. Extremities: 1+ to 2 bilateral lower extremity edema.    Status is: Inpatient:      Pressure Injury 02/26/23 Sacrum Bilateral;Lower Stage 2 -  Partial thickness loss of dermis presenting as a shallow open injury with a red, pink wound bed without slough. Red, Pink (Active)  02/26/23 1830  Location: Sacrum  Location Orientation: Bilateral;Lower  Staging: Stage 2 -  Partial thickness loss of dermis presenting as a shallow open injury with a red, pink wound bed without slough.  Wound Description (Comments): Red, Pink  Present on Admission: Yes   Time spent: 55 minutes.  Barnetta Chapel   [facility] If 7PM-7AM, please contact night-coverage at [email or web address], Office  [phone]   02/19/2023, 9:54 AM  LOS: 25 days

## 2023-03-08 DEATH — deceased

## 2023-03-21 ENCOUNTER — Telehealth: Payer: Self-pay | Admitting: Internal Medicine

## 2023-03-21 NOTE — Telephone Encounter (Signed)
 I was just notified today and have done this already.

## 2023-03-21 NOTE — Telephone Encounter (Signed)
 Copied from CRM [id number]. Topic: General - Deceased Patient >> 05-Apr-2023 10:45 AM [place] E wrote: Name of caller: [name]  Date of death: 03-19-2023  Name of funeral home: [employer] [facility]  Phone number of funeral home: [phone]  Provider that needs to sign form: Dr. Hillard Danker  Timeline for signing: [name] called in stated that it has already been a few days, so please sign death certificate as soon as possible.
